# Patient Record
Sex: Female | Born: 1970 | Race: White | Hispanic: No | Marital: Married | Smoking: Never smoker
Health system: Southern US, Community
[De-identification: ages and names within clinical notes are randomized; demographics above are authoritative.]

## PROBLEM LIST (undated history)

## (undated) DIAGNOSIS — G8929 Other chronic pain: Secondary | ICD-10-CM

## (undated) DIAGNOSIS — R102 Pelvic and perineal pain: Secondary | ICD-10-CM

## (undated) DIAGNOSIS — M545 Low back pain, unspecified: Secondary | ICD-10-CM

## (undated) DIAGNOSIS — F32A Depression, unspecified: Secondary | ICD-10-CM

## (undated) DIAGNOSIS — Z8619 Personal history of other infectious and parasitic diseases: Secondary | ICD-10-CM

## (undated) DIAGNOSIS — K219 Gastro-esophageal reflux disease without esophagitis: Secondary | ICD-10-CM

## (undated) DIAGNOSIS — I10 Essential (primary) hypertension: Secondary | ICD-10-CM

## (undated) DIAGNOSIS — N946 Dysmenorrhea, unspecified: Secondary | ICD-10-CM

## (undated) DIAGNOSIS — I2699 Other pulmonary embolism without acute cor pulmonale: Secondary | ICD-10-CM

## (undated) DIAGNOSIS — N803 Endometriosis of pelvic peritoneum: Principal | ICD-10-CM

## (undated) DIAGNOSIS — F419 Anxiety disorder, unspecified: Secondary | ICD-10-CM

## (undated) DIAGNOSIS — N83209 Unspecified ovarian cyst, unspecified side: Secondary | ICD-10-CM

## (undated) DIAGNOSIS — J301 Allergic rhinitis due to pollen: Secondary | ICD-10-CM

## (undated) DIAGNOSIS — I429 Cardiomyopathy, unspecified: Secondary | ICD-10-CM

## (undated) DIAGNOSIS — T8859XA Other complications of anesthesia, initial encounter: Secondary | ICD-10-CM

## (undated) DIAGNOSIS — F329 Major depressive disorder, single episode, unspecified: Secondary | ICD-10-CM

## (undated) DIAGNOSIS — O2230 Deep phlebothrombosis in pregnancy, unspecified trimester: Secondary | ICD-10-CM

## (undated) DIAGNOSIS — T4145XA Adverse effect of unspecified anesthetic, initial encounter: Secondary | ICD-10-CM

## (undated) DIAGNOSIS — IMO0002 Reserved for concepts with insufficient information to code with codable children: Secondary | ICD-10-CM

## (undated) DIAGNOSIS — G43909 Migraine, unspecified, not intractable, without status migrainosus: Secondary | ICD-10-CM

## (undated) DIAGNOSIS — R519 Headache, unspecified: Secondary | ICD-10-CM

## (undated) DIAGNOSIS — R51 Headache: Secondary | ICD-10-CM

## (undated) HISTORY — DX: Dysmenorrhea, unspecified: N94.6

## (undated) HISTORY — DX: Personal history of other infectious and parasitic diseases: Z86.19

## (undated) HISTORY — DX: Unspecified ovarian cyst, unspecified side: N83.209

## (undated) HISTORY — DX: Other pulmonary embolism without acute cor pulmonale: I26.99

## (undated) HISTORY — DX: Reserved for concepts with insufficient information to code with codable children: IMO0002

## (undated) HISTORY — DX: Pelvic and perineal pain: R10.2

## (undated) HISTORY — DX: Cardiomyopathy, unspecified: I42.9

---
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
## 1997-08-10 ENCOUNTER — Emergency Department (HOSPITAL_COMMUNITY): Admission: EM | Admit: 1997-08-10 | Discharge: 1997-08-11 | Payer: Self-pay | Admitting: Emergency Medicine

## 1998-01-11 ENCOUNTER — Inpatient Hospital Stay (HOSPITAL_COMMUNITY): Admission: AD | Admit: 1998-01-11 | Discharge: 1998-01-11 | Payer: Self-pay | Admitting: Obstetrics & Gynecology

## 1998-01-11 ENCOUNTER — Encounter: Payer: Self-pay | Admitting: Obstetrics & Gynecology

## 1998-01-15 ENCOUNTER — Inpatient Hospital Stay (HOSPITAL_COMMUNITY): Admission: AD | Admit: 1998-01-15 | Discharge: 1998-01-15 | Payer: Self-pay | Admitting: Obstetrics & Gynecology

## 1998-01-24 ENCOUNTER — Encounter: Admission: RE | Admit: 1998-01-24 | Discharge: 1998-01-24 | Payer: Self-pay | Admitting: Family Medicine

## 1998-02-28 ENCOUNTER — Encounter: Admission: RE | Admit: 1998-02-28 | Discharge: 1998-02-28 | Payer: Self-pay | Admitting: Family Medicine

## 1998-03-07 ENCOUNTER — Encounter: Admission: RE | Admit: 1998-03-07 | Discharge: 1998-03-07 | Payer: Self-pay | Admitting: Family Medicine

## 1998-03-07 ENCOUNTER — Other Ambulatory Visit: Admission: RE | Admit: 1998-03-07 | Discharge: 1998-03-07 | Payer: Self-pay | Admitting: *Deleted

## 1998-03-09 ENCOUNTER — Emergency Department (HOSPITAL_COMMUNITY): Admission: EM | Admit: 1998-03-09 | Discharge: 1998-03-09 | Payer: Self-pay | Admitting: Emergency Medicine

## 1998-03-19 ENCOUNTER — Encounter: Admission: RE | Admit: 1998-03-19 | Discharge: 1998-03-19 | Payer: Self-pay | Admitting: Family Medicine

## 1998-04-05 ENCOUNTER — Ambulatory Visit (HOSPITAL_COMMUNITY): Admission: RE | Admit: 1998-04-05 | Discharge: 1998-04-05 | Payer: Self-pay | Admitting: *Deleted

## 1998-04-18 ENCOUNTER — Encounter: Admission: RE | Admit: 1998-04-18 | Discharge: 1998-04-18 | Payer: Self-pay | Admitting: Family Medicine

## 1998-05-01 ENCOUNTER — Encounter: Admission: RE | Admit: 1998-05-01 | Discharge: 1998-05-01 | Payer: Self-pay | Admitting: Family Medicine

## 1998-05-23 ENCOUNTER — Ambulatory Visit (HOSPITAL_COMMUNITY): Admission: RE | Admit: 1998-05-23 | Discharge: 1998-05-23 | Payer: Self-pay | Admitting: *Deleted

## 1998-05-29 ENCOUNTER — Inpatient Hospital Stay (HOSPITAL_COMMUNITY): Admission: AD | Admit: 1998-05-29 | Discharge: 1998-05-29 | Payer: Self-pay | Admitting: *Deleted

## 1998-06-11 ENCOUNTER — Encounter: Admission: RE | Admit: 1998-06-11 | Discharge: 1998-06-11 | Payer: Self-pay | Admitting: Family Medicine

## 1998-06-12 ENCOUNTER — Encounter (HOSPITAL_COMMUNITY): Admission: RE | Admit: 1998-06-12 | Discharge: 1998-08-13 | Payer: Self-pay | Admitting: *Deleted

## 1998-06-13 ENCOUNTER — Encounter: Admission: RE | Admit: 1998-06-13 | Discharge: 1998-06-13 | Payer: Self-pay | Admitting: Family Medicine

## 1998-06-22 ENCOUNTER — Encounter: Admission: RE | Admit: 1998-06-22 | Discharge: 1998-06-22 | Payer: Self-pay | Admitting: Family Medicine

## 1998-06-28 ENCOUNTER — Inpatient Hospital Stay (HOSPITAL_COMMUNITY): Admission: AD | Admit: 1998-06-28 | Discharge: 1998-06-28 | Payer: Self-pay | Admitting: Obstetrics & Gynecology

## 1998-06-28 ENCOUNTER — Encounter: Admission: RE | Admit: 1998-06-28 | Discharge: 1998-06-28 | Payer: Self-pay | Admitting: Family Medicine

## 1998-07-04 ENCOUNTER — Encounter: Admission: RE | Admit: 1998-07-04 | Discharge: 1998-07-04 | Payer: Self-pay | Admitting: Sports Medicine

## 1998-07-30 ENCOUNTER — Encounter: Admission: RE | Admit: 1998-07-30 | Discharge: 1998-07-30 | Payer: Self-pay | Admitting: Family Medicine

## 1998-08-07 DIAGNOSIS — O2230 Deep phlebothrombosis in pregnancy, unspecified trimester: Secondary | ICD-10-CM

## 1998-08-07 DIAGNOSIS — I2699 Other pulmonary embolism without acute cor pulmonale: Secondary | ICD-10-CM

## 1998-08-07 HISTORY — DX: Deep phlebothrombosis in pregnancy, unspecified trimester: O22.30

## 1998-08-07 HISTORY — DX: Other pulmonary embolism without acute cor pulmonale: I26.99

## 1998-08-12 ENCOUNTER — Inpatient Hospital Stay (HOSPITAL_COMMUNITY): Admission: AD | Admit: 1998-08-12 | Discharge: 1998-08-17 | Payer: Self-pay | Admitting: *Deleted

## 1998-08-16 ENCOUNTER — Encounter: Payer: Self-pay | Admitting: Obstetrics & Gynecology

## 1998-08-18 ENCOUNTER — Inpatient Hospital Stay (HOSPITAL_COMMUNITY): Admission: AD | Admit: 1998-08-18 | Discharge: 1998-08-25 | Payer: Self-pay | Admitting: *Deleted

## 1998-08-18 ENCOUNTER — Encounter: Payer: Self-pay | Admitting: Obstetrics

## 1998-08-19 ENCOUNTER — Encounter: Payer: Self-pay | Admitting: Obstetrics & Gynecology

## 1998-08-19 ENCOUNTER — Encounter: Payer: Self-pay | Admitting: Obstetrics

## 1998-08-20 ENCOUNTER — Encounter: Payer: Self-pay | Admitting: Pulmonary Disease

## 1998-08-22 ENCOUNTER — Encounter: Admission: RE | Admit: 1998-08-22 | Discharge: 1998-08-22 | Payer: Self-pay | Admitting: Family Medicine

## 1998-08-27 ENCOUNTER — Encounter: Admission: RE | Admit: 1998-08-27 | Discharge: 1998-08-27 | Payer: Self-pay | Admitting: Family Medicine

## 1998-08-30 ENCOUNTER — Encounter: Admission: RE | Admit: 1998-08-30 | Discharge: 1998-08-30 | Payer: Self-pay | Admitting: Family Medicine

## 1998-09-03 ENCOUNTER — Encounter: Admission: RE | Admit: 1998-09-03 | Discharge: 1998-09-03 | Payer: Self-pay | Admitting: Family Medicine

## 1998-09-04 ENCOUNTER — Encounter: Admission: RE | Admit: 1998-09-04 | Discharge: 1998-12-03 | Payer: Self-pay | Admitting: *Deleted

## 1998-09-11 ENCOUNTER — Encounter: Admission: RE | Admit: 1998-09-11 | Discharge: 1998-09-11 | Payer: Self-pay | Admitting: Sports Medicine

## 1998-09-12 ENCOUNTER — Encounter: Admission: RE | Admit: 1998-09-12 | Discharge: 1998-09-12 | Payer: Self-pay | Admitting: Family Medicine

## 1998-09-25 ENCOUNTER — Encounter: Admission: RE | Admit: 1998-09-25 | Discharge: 1998-09-25 | Payer: Self-pay | Admitting: Family Medicine

## 1998-10-04 ENCOUNTER — Encounter: Admission: RE | Admit: 1998-10-04 | Discharge: 1998-10-04 | Payer: Self-pay | Admitting: Family Medicine

## 1998-10-08 ENCOUNTER — Encounter: Admission: RE | Admit: 1998-10-08 | Discharge: 1998-10-08 | Payer: Self-pay | Admitting: Family Medicine

## 1998-10-12 ENCOUNTER — Encounter: Admission: RE | Admit: 1998-10-12 | Discharge: 1998-10-12 | Payer: Self-pay | Admitting: Family Medicine

## 1998-10-18 ENCOUNTER — Encounter: Payer: Self-pay | Admitting: Family Medicine

## 1998-10-18 ENCOUNTER — Encounter: Payer: Self-pay | Admitting: Emergency Medicine

## 1998-10-18 ENCOUNTER — Inpatient Hospital Stay (HOSPITAL_COMMUNITY): Admission: EM | Admit: 1998-10-18 | Discharge: 1998-10-20 | Payer: Self-pay | Admitting: Emergency Medicine

## 1998-10-18 ENCOUNTER — Inpatient Hospital Stay (HOSPITAL_COMMUNITY): Admission: AD | Admit: 1998-10-18 | Discharge: 1998-10-18 | Payer: Self-pay | Admitting: Internal Medicine

## 1998-10-18 ENCOUNTER — Encounter: Admission: RE | Admit: 1998-10-18 | Discharge: 1998-10-18 | Payer: Self-pay | Admitting: Family Medicine

## 1998-10-19 ENCOUNTER — Encounter: Payer: Self-pay | Admitting: Family Medicine

## 1998-10-23 ENCOUNTER — Encounter: Admission: RE | Admit: 1998-10-23 | Discharge: 1998-10-23 | Payer: Self-pay | Admitting: Obstetrics & Gynecology

## 1998-10-26 ENCOUNTER — Encounter: Admission: RE | Admit: 1998-10-26 | Discharge: 1998-10-26 | Payer: Self-pay | Admitting: Family Medicine

## 1998-11-02 ENCOUNTER — Encounter: Admission: RE | Admit: 1998-11-02 | Discharge: 1998-11-02 | Payer: Self-pay | Admitting: Sports Medicine

## 1998-11-06 ENCOUNTER — Encounter: Admission: RE | Admit: 1998-11-06 | Discharge: 1998-11-06 | Payer: Self-pay | Admitting: Sports Medicine

## 1998-11-23 ENCOUNTER — Encounter: Admission: RE | Admit: 1998-11-23 | Discharge: 1998-11-23 | Payer: Self-pay | Admitting: Family Medicine

## 1998-11-27 ENCOUNTER — Encounter: Admission: RE | Admit: 1998-11-27 | Discharge: 1998-11-27 | Payer: Self-pay | Admitting: Sports Medicine

## 1998-12-28 ENCOUNTER — Encounter: Admission: RE | Admit: 1998-12-28 | Discharge: 1998-12-28 | Payer: Self-pay | Admitting: Sports Medicine

## 1999-01-15 ENCOUNTER — Encounter: Admission: RE | Admit: 1999-01-15 | Discharge: 1999-01-15 | Payer: Self-pay | Admitting: Sports Medicine

## 1999-01-22 ENCOUNTER — Encounter: Admission: RE | Admit: 1999-01-22 | Discharge: 1999-01-22 | Payer: Self-pay | Admitting: Sports Medicine

## 1999-01-29 ENCOUNTER — Encounter: Admission: RE | Admit: 1999-01-29 | Discharge: 1999-01-29 | Payer: Self-pay | Admitting: Sports Medicine

## 1999-02-05 ENCOUNTER — Encounter: Admission: RE | Admit: 1999-02-05 | Discharge: 1999-02-05 | Payer: Self-pay | Admitting: Family Medicine

## 1999-02-14 ENCOUNTER — Encounter: Admission: RE | Admit: 1999-02-14 | Discharge: 1999-02-14 | Payer: Self-pay | Admitting: Family Medicine

## 1999-04-09 ENCOUNTER — Encounter: Admission: RE | Admit: 1999-04-09 | Discharge: 1999-04-09 | Payer: Self-pay | Admitting: Sports Medicine

## 1999-11-05 ENCOUNTER — Emergency Department (HOSPITAL_COMMUNITY): Admission: EM | Admit: 1999-11-05 | Discharge: 1999-11-05 | Payer: Self-pay | Admitting: Emergency Medicine

## 1999-11-05 ENCOUNTER — Encounter: Payer: Self-pay | Admitting: Emergency Medicine

## 2000-03-18 ENCOUNTER — Emergency Department (HOSPITAL_COMMUNITY): Admission: EM | Admit: 2000-03-18 | Discharge: 2000-03-18 | Payer: Self-pay | Admitting: Emergency Medicine

## 2000-03-18 ENCOUNTER — Encounter: Payer: Self-pay | Admitting: Emergency Medicine

## 2000-06-11 ENCOUNTER — Encounter: Payer: Self-pay | Admitting: Emergency Medicine

## 2000-06-11 ENCOUNTER — Emergency Department (HOSPITAL_COMMUNITY): Admission: EM | Admit: 2000-06-11 | Discharge: 2000-06-11 | Payer: Self-pay | Admitting: Emergency Medicine

## 2001-04-28 ENCOUNTER — Encounter: Admission: RE | Admit: 2001-04-28 | Discharge: 2001-04-28 | Payer: Self-pay | Admitting: Internal Medicine

## 2001-04-28 ENCOUNTER — Encounter: Payer: Self-pay | Admitting: Internal Medicine

## 2003-04-27 ENCOUNTER — Ambulatory Visit (HOSPITAL_COMMUNITY): Admission: RE | Admit: 2003-04-27 | Discharge: 2003-04-27 | Payer: Self-pay | Admitting: Family Medicine

## 2003-09-25 ENCOUNTER — Emergency Department (HOSPITAL_COMMUNITY): Admission: EM | Admit: 2003-09-25 | Discharge: 2003-09-25 | Payer: Self-pay | Admitting: Emergency Medicine

## 2004-01-13 ENCOUNTER — Emergency Department (HOSPITAL_COMMUNITY): Admission: EM | Admit: 2004-01-13 | Discharge: 2004-01-14 | Payer: Self-pay | Admitting: Emergency Medicine

## 2004-08-20 ENCOUNTER — Emergency Department (HOSPITAL_COMMUNITY): Admission: EM | Admit: 2004-08-20 | Discharge: 2004-08-20 | Payer: Self-pay | Admitting: Emergency Medicine

## 2004-12-09 ENCOUNTER — Emergency Department (HOSPITAL_COMMUNITY): Admission: EM | Admit: 2004-12-09 | Discharge: 2004-12-09 | Payer: Self-pay | Admitting: Emergency Medicine

## 2005-01-27 ENCOUNTER — Encounter: Admission: RE | Admit: 2005-01-27 | Discharge: 2005-01-27 | Payer: Self-pay | Admitting: Internal Medicine

## 2005-11-19 ENCOUNTER — Emergency Department (HOSPITAL_COMMUNITY): Admission: EM | Admit: 2005-11-19 | Discharge: 2005-11-19 | Payer: Self-pay | Admitting: Emergency Medicine

## 2005-11-25 ENCOUNTER — Ambulatory Visit: Payer: Self-pay | Admitting: Cardiology

## 2005-12-02 ENCOUNTER — Ambulatory Visit: Payer: Self-pay

## 2005-12-02 ENCOUNTER — Encounter: Payer: Self-pay | Admitting: Cardiology

## 2008-01-07 HISTORY — PX: ECTOPIC PREGNANCY SURGERY: SHX613

## 2008-01-07 HISTORY — PX: TUBAL LIGATION: SHX77

## 2008-03-02 ENCOUNTER — Encounter: Admission: RE | Admit: 2008-03-02 | Discharge: 2008-03-02 | Payer: Self-pay | Admitting: Gastroenterology

## 2008-03-09 ENCOUNTER — Ambulatory Visit (HOSPITAL_COMMUNITY): Admission: RE | Admit: 2008-03-09 | Discharge: 2008-03-09 | Payer: Self-pay | Admitting: Gastroenterology

## 2008-05-11 ENCOUNTER — Ambulatory Visit (HOSPITAL_COMMUNITY): Admission: RE | Admit: 2008-05-11 | Discharge: 2008-05-11 | Payer: Self-pay | Admitting: Gastroenterology

## 2008-06-05 ENCOUNTER — Emergency Department (HOSPITAL_COMMUNITY): Admission: EM | Admit: 2008-06-05 | Discharge: 2008-06-06 | Payer: Self-pay | Admitting: Emergency Medicine

## 2008-08-14 ENCOUNTER — Encounter: Payer: Self-pay | Admitting: Obstetrics and Gynecology

## 2008-08-14 ENCOUNTER — Ambulatory Visit (HOSPITAL_COMMUNITY): Admission: AD | Admit: 2008-08-14 | Discharge: 2008-08-14 | Payer: Self-pay | Admitting: Obstetrics and Gynecology

## 2008-08-15 ENCOUNTER — Ambulatory Visit (HOSPITAL_COMMUNITY): Admission: AD | Admit: 2008-08-15 | Discharge: 2008-08-15 | Payer: Self-pay | Admitting: Obstetrics and Gynecology

## 2010-01-27 ENCOUNTER — Encounter: Payer: Self-pay | Admitting: Gastroenterology

## 2010-04-14 LAB — COMPREHENSIVE METABOLIC PANEL
ALT: 17 U/L (ref 0–35)
AST: 19 U/L (ref 0–37)
Albumin: 4.1 g/dL (ref 3.5–5.2)
Alkaline Phosphatase: 37 U/L — ABNORMAL LOW (ref 39–117)
BUN: 8 mg/dL (ref 6–23)
CO2: 24 mEq/L (ref 19–32)
Calcium: 9.1 mg/dL (ref 8.4–10.5)
Chloride: 107 mEq/L (ref 96–112)
Creatinine, Ser: 0.62 mg/dL (ref 0.4–1.2)
GFR calc Af Amer: >60 mL/min (ref >60)
GFR calc non Af Amer: >60 mL/min (ref >60)
Glucose, Bld: 126 mg/dL — ABNORMAL HIGH (ref 70–99)
Potassium: 3.5 mEq/L (ref 3.5–5.1)
Sodium: 137 mEq/L (ref 135–145)
Total Bilirubin: 0.8 mg/dL (ref 0.3–1.2)
Total Protein: 6.2 g/dL (ref 6.0–8.3)

## 2010-04-14 LAB — RH IMMUNE GLOBULIN WORKUP (NOT WOMEN'S HOSP)
ABO/RH(D): B NEG
Antibody Screen: NEGATIVE

## 2010-04-14 LAB — CBC
HCT: 33.3 % — ABNORMAL LOW (ref 36.0–46.0)
Hemoglobin: 11.8 g/dL — ABNORMAL LOW (ref 12.0–15.0)
MCHC: 35.4 g/dL (ref 30.0–36.0)
MCV: 93.8 fL (ref 78.0–100.0)
Platelets: 191 10*3/uL (ref 150–400)
RBC: 3.55 MIL/uL — ABNORMAL LOW (ref 3.87–5.11)
RDW: 12.8 % (ref 11.5–15.5)
WBC: 7.2 10*3/uL (ref 4.0–10.5)

## 2010-04-14 LAB — HCG, QUANTITATIVE, PREGNANCY: hCG, Beta Chain, Quant, S: 38 m[IU]/mL — ABNORMAL HIGH (ref <5)

## 2010-04-14 LAB — ABO/RH: ABO/RH(D): B NEG

## 2010-04-16 LAB — CBC
HCT: 41.6 % (ref 36.0–46.0)
Hemoglobin: 14.7 g/dL (ref 12.0–15.0)
MCHC: 35.4 g/dL (ref 30.0–36.0)
MCV: 91 fL (ref 78.0–100.0)
Platelets: 180 10*3/uL (ref 150–400)
RBC: 4.58 MIL/uL (ref 3.87–5.11)
RDW: 12.4 % (ref 11.5–15.5)
WBC: 6.8 10*3/uL (ref 4.0–10.5)

## 2010-04-16 LAB — POCT PREGNANCY, URINE: Preg Test, Ur: NEGATIVE

## 2010-04-16 LAB — URINALYSIS, ROUTINE W REFLEX MICROSCOPIC
Bilirubin Urine: NEGATIVE
Glucose, UA: NEGATIVE mg/dL
Hgb urine dipstick: NEGATIVE
Ketones, ur: NEGATIVE mg/dL
Nitrite: NEGATIVE
Protein, ur: NEGATIVE mg/dL
Specific Gravity, Urine: 1.007 (ref 1.005–1.030)
Urobilinogen, UA: 0.2 mg/dL (ref 0.0–1.0)
pH: 6 (ref 5.0–8.0)

## 2010-04-16 LAB — POCT I-STAT, CHEM 8
BUN: 14 mg/dL (ref 6–23)
Calcium, Ion: 1.15 mmol/L (ref 1.12–1.32)
Chloride: 106 mEq/L (ref 96–112)
Creatinine, Ser: 0.7 mg/dL (ref 0.4–1.2)
Glucose, Bld: 77 mg/dL (ref 70–99)
HCT: 44 % (ref 36.0–46.0)
Hemoglobin: 15 g/dL (ref 12.0–15.0)
Potassium: 3.8 mEq/L (ref 3.5–5.1)
Sodium: 138 mEq/L (ref 135–145)
TCO2: 22 mmol/L (ref 0–100)

## 2010-04-16 LAB — DIFFERENTIAL
Basophils Absolute: 0 10*3/uL (ref 0.0–0.1)
Basophils Relative: 0 % (ref 0–1)
Eosinophils Absolute: 0.1 10*3/uL (ref 0.0–0.7)
Eosinophils Relative: 2 % (ref 0–5)
Lymphocytes Relative: 30 % (ref 12–46)
Lymphs Abs: 2.1 10*3/uL (ref 0.7–4.0)
Monocytes Absolute: 0.5 10*3/uL (ref 0.1–1.0)
Monocytes Relative: 7 % (ref 3–12)
Neutro Abs: 4.2 10*3/uL (ref 1.7–7.7)
Neutrophils Relative %: 61 % (ref 43–77)

## 2010-04-16 LAB — HEMOCCULT GUIAC POC 1CARD (OFFICE): Fecal Occult Bld: POSITIVE

## 2010-04-16 LAB — GC/CHLAMYDIA PROBE AMP, GENITAL
Chlamydia, DNA Probe: NEGATIVE
GC Probe Amp, Genital: NEGATIVE

## 2010-04-16 LAB — WET PREP, GENITAL
Clue Cells Wet Prep HPF POC: NONE SEEN
Trich, Wet Prep: NONE SEEN
Yeast Wet Prep HPF POC: NONE SEEN

## 2010-05-21 NOTE — Op Note (Signed)
[name], Hughes              ACCOUNT NO.:  [id number]   MEDICAL RECORD NO.:  [id number]          PATIENT TYPE:  AMB   LOCATION:  DFTL                          FACILITY:  [facility]   PHYSICIAN:  Cynthia P. Romine, M.D.DATE OF BIRTH:  09-23-1970   DATE OF PROCEDURE:  08/14/2008  DATE OF DISCHARGE:  08/14/2008                               OPERATIVE REPORT   PREOPERATIVE DIAGNOSIS:  Left ectopic pregnancy and desire for attempt  at permanent surgical sterilization.   POSTOPERATIVE DIAGNOSIS:  Left ectopic pregnancy and desire for attempt  at permanent surgical sterilization.   PATHOLOGY:  Pending.   PROCEDURE:  Laparoscopic left salpingectomy and right tubal cautery  procedure.   SURGEON:  Cynthia P. Romine, MD   ANESTHESIA:  General endotracheal.   ESTIMATED BLOOD LOSS:  Minimal for surgery.  There was approximately 200  mL in the abdomen upon entry.   COMPLICATIONS:  None.   PROCEDURE:  The patient was taken to the operating room and after the  induction of adequate general anesthesia, was placed in dorsal lithotomy  position and prepped and draped in usual fashion.  A Foley catheter was  inserted.  The Hulka uterine manipulator was placed without difficulty.  Attention was next turned to the umbilicus.  A small subumbilical  incision was made to the site of previous laparoscopy after infiltrating  with 2 mL of 0.25% Marcaine.  The Veress needle was inserted into  peritoneal space.  Proper placement was tested by noting a negative  aspirate and free flow of saline through the Veress needle, again with a  negative aspirate and then, by noting the response, a drop of saline  placed, the hub of the needle to negative pressure as the abdominal wall  was elevated.  Pneumoperitoneum was created with 2 L of CO2 using the  automatic insufflator.  A disposable 10/11-mm bladed trocar was then  inserted into peritoneal space and a proper placement noted with the  laparoscope.  The  abdomen was inspected.  There was approximately 200 mL  of blood and clot in the cul-de-sac and around the left tube.  The  fimbria of the left tube were covered with clot and the distal portion  of the isthmus was dilated, consistent with an ectopic pregnancy.  In  the cul-de-sac, there was some endometriosis on the patient's right  involving the uterosacral ligament, which caused windows in the  peritoneum.  The right tube and ovary did appear normal.  The anterior  cul-de-sac was normal.  At the level of the pelvic brim, near the  infundibulopelvic ligament, there was some endometriosis that was  adhering the colon to the sidewall.  After infiltrating the areas with  0.25% Marcaine and transilluminating two smaller 5-mm incisions were  made in the right and left lower quadrants and the 5-mm trocars were  placed under direct visualization.  An EnSeal was placed through one and  a grasper through the other.  The EnSeal was used to come across the  tube at the cornu and then across the mesosalpinx until the tube was  free.  The tube  was grasped with a long pickup and brought out through  the operative laparoscope to the umbilicus.  The Nezhat was used to  irrigate the site of the salpingectomy.  It was found to be free of  bleeding.  Bipolar cautery was then used to cauterize an approximately 3-  cm segment of the right tube.  Photographic that documentation was taken  after the cautery.  Photograph was taken of the right cul-de-sac with  the endometriosis.  The upper abdomen was inspected.  The liver edge was  smooth.  The adhesions were throughout the periumbilical area and on the  patient's left, these were not taken down.  They were basically omentum  to the anterior abdominal wall.  The lower trocar sleeves were removed  under direct visualization.  The scope was removed and pneumoperitoneum  was allowed to escape.  The umbilical sleeve was removed.  The fascia  was closed with a  single suture of 0 Vicryl and the umbilicus.  The skin  was closed with 4-0 Vicryl Rapide at the umbilicus and with Dermabond at  the 5-mm incision.  Instruments removed from the vagina and the  procedure was terminated.  The patient tolerated it well, went in  satisfactory condition to postanesthesia recovery.      Cynthia P. Romine, M.D.  Electronically Signed     CPR/MEDQ  D:  08/14/2008  T:  08/15/2008  Job:  [id number]

## 2010-05-21 NOTE — Assessment & Plan Note (Signed)
[facility]                                 ON-CALL NOTE   Nicole Hughes, Nicole Hughes                       MRN:          [id number]  DATE:06/05/2008                            DOB:          1970/08/23    PHONE NUMBER:  [phone].   PHYSICIAN:  Anselmo Rod, M.D.   Patient calls today complaining of back pain and lower abdominal pain  that began acutely on Saturday night.  She has had chronic problems of  nausea, anorexia, and unexplained weight loss which has been evaluated  by Dr. Loreta Ave without a clear cause, per the patient's report.  I advised  her that she needs to be seen in an urgent care center or an emergency  room as soon as possible for further evaluation.  She agrees.  She  states that she will have a friend or family member take her to an  urgent care center or the emergency room immediately.     Venita Lick. Russella Dar, MD, [facility]  Electronically Signed    MTS/MedQ  DD: 06/05/2008  DT: 06/05/2008  Job #: [id number]   cc:   Anselmo Rod, M.D.

## 2011-04-18 ENCOUNTER — Ambulatory Visit (INDEPENDENT_AMBULATORY_CARE_PROVIDER_SITE_OTHER): Payer: 59 | Admitting: Family Medicine

## 2011-04-18 VITALS — BP 182/96 | HR 69 | Temp 98.6°F | Resp 20 | Ht 65.0 in | Wt 156.0 lb

## 2011-04-18 DIAGNOSIS — M545 Low back pain, unspecified: Secondary | ICD-10-CM

## 2011-04-18 DIAGNOSIS — M549 Dorsalgia, unspecified: Secondary | ICD-10-CM

## 2011-04-18 MED ORDER — OXYCODONE-ACETAMINOPHEN 5-325 MG PO TABS: 1.0000 | ORAL_TABLET | Freq: Three times a day (TID) | ORAL | Status: DC | PRN

## 2011-04-18 MED ORDER — PREDNISONE 20 MG PO TABS: ORAL_TABLET | ORAL | Status: DC

## 2011-04-18 MED ORDER — CYCLOBENZAPRINE HCL 10 MG PO TABS: ORAL_TABLET | ORAL | Status: DC

## 2011-04-18 NOTE — Progress Notes (Signed)
41 yo woman who volunteered for [employer] last Thursday 8 days ago and developed left paralumbar pain the next day.  She has had spasm in paraspinal muscles since. No position is comfortable.  No leg weakness, some tingling in big toe.  Tried NSAID's OTC without help. Has children 12 and 17 at home, driven by husband O: in obvious pain Tight left parasp. Muscles with tenderness from lower left ribs downward Motor of LLE is normal as is sensory Good pedal pulses, no edema Normal SLR  A:  Muscle strain lumbar muscles  P: percocet, prednisone, flexeril at hs. Call INB 48 hours.

## 2011-04-18 NOTE — Patient Instructions (Signed)

## 2011-04-26 ENCOUNTER — Other Ambulatory Visit: Payer: Self-pay

## 2011-04-26 ENCOUNTER — Ambulatory Visit: Payer: 59

## 2011-04-26 ENCOUNTER — Ambulatory Visit (INDEPENDENT_AMBULATORY_CARE_PROVIDER_SITE_OTHER): Payer: 59 | Admitting: Family Medicine

## 2011-04-26 VITALS — BP 169/102 | HR 85 | Temp 98.6°F | Resp 16 | Ht 64.5 in | Wt 154.8 lb

## 2011-04-26 DIAGNOSIS — B029 Zoster without complications: Secondary | ICD-10-CM

## 2011-04-26 DIAGNOSIS — R209 Unspecified disturbances of skin sensation: Secondary | ICD-10-CM

## 2011-04-26 DIAGNOSIS — R200 Anesthesia of skin: Secondary | ICD-10-CM

## 2011-04-26 DIAGNOSIS — M549 Dorsalgia, unspecified: Secondary | ICD-10-CM

## 2011-04-26 DIAGNOSIS — R202 Paresthesia of skin: Secondary | ICD-10-CM

## 2011-04-26 MED ORDER — VALACYCLOVIR HCL 1 G PO TABS: 1000.0000 mg | ORAL_TABLET | Freq: Three times a day (TID) | ORAL | Status: DC

## 2011-04-26 MED ORDER — CYCLOBENZAPRINE HCL 10 MG PO TABS: ORAL_TABLET | ORAL | Status: DC

## 2011-04-26 MED ORDER — PREDNISONE 20 MG PO TABS: ORAL_TABLET | ORAL | Status: AC

## 2011-04-26 MED ORDER — ACYCLOVIR 400 MG PO TABS: 400.0000 mg | ORAL_TABLET | Freq: Two times a day (BID) | ORAL | Status: AC

## 2011-04-26 MED ORDER — OXYCODONE-ACETAMINOPHEN 5-325 MG PO TABS
1.0000 | ORAL_TABLET | Freq: Three times a day (TID) | ORAL | Status: AC | PRN
Start: 2011-04-26 — End: 2011-05-06

## 2011-04-26 MED ORDER — KETOROLAC TROMETHAMINE 60 MG/2ML IM SOLN
60.0000 mg | Freq: Once | INTRAMUSCULAR | Status: AC
  Administered 2011-04-26: 60 mg via INTRAMUSCULAR

## 2011-04-26 NOTE — Telephone Encounter (Signed)
Changed Valtrex to generic. Pt came in to have it changed. Sent to [facility] on [address]

## 2011-04-26 NOTE — Progress Notes (Signed)
Patient Name: KAEDE CLENDENEN Date of Birth: 05-14-1970 Medical Record Number: [id number] Gender: female Date of Encounter: 04/26/2011  History of Present Illness:  ALIVIANA BURDELL is a 41 y.o. very pleasant female patient who presents with the following:  Here today to recheck her back pain- see OV 04/18/11.  Prednisone seemed to help her a lot last time.  She finsished her prednisone on Tuesday- by Wednesday the pain was coming back. Today is Sunday.  Pain got much worse again last night.  Pain is worst in her left lower back and it radiates down her left leg.  Her left foot is tingling again.  She has no saddle anesthesia or bowel/ bladder incontinence.   She has never had back pain like this before (prior to her last visit here).  The pain started after she finished working on a [employer] house a little over a week ago- insidious onset.    She has also noticed a tender ?bite on her left trunk for about a week.  It is painful and the surrounding skin is tender/ sensitive/ burning/ tingling  LMP was the first of the month.   She is otherwise generally healthy- she is very excited because her daughter is going to the prom tonight and she is looking forward to taking photos and seeing her off.   There is no problem list on file for this patient.  No past medical history on file. No past surgical history on file. History  Substance Use Topics  . Smoking status: Never Smoker   . Smokeless tobacco: Not on file  . Alcohol Use: Not on file   No family history on file. No Known Allergies  Medication list has been reviewed and updated.  Review of Systems: As per HPI- otherwise negative.  Physical Examination: Filed Vitals:   04/26/11 1108  BP: 169/102  Pulse: 85  Temp: 98.6 F (37 C)  TempSrc: Oral  Resp: 16  Height: 5' 4.5" (1.638 m)  Weight: 154 lb 12.8 oz (70.217 kg)   130/88 recheck BP after toradol Body mass index is 26.16 kg/(m^2).  GEN: WDWN, NAD,  Non-toxic, A & O x 3, appears uncomfortable HEENT: Atraumatic, Normocephalic. Neck supple. No masses, No LAD. Ears and Nose: No external deformity. CV: RRR, No M/G/R. No JVD. No thrill. No extra heart sounds. PULM: CTA B, no wheezes, crackles, rhonchi. No retractions. No resp. distress. No accessory muscle use. There is a nickel sized tender lesion on her left trunk and a smaller lesion medially in the same dermatome, the surrounding skin is tender and sensitive.  No active vesicles EXTR: No c/c/e NEURO Normal gait.  PSYCH: Normally interactive. Conversant. Not depressed or anxious appearing.  Calm demeanor.  Back: tender and very tight in left thoracic and lumbar paraspinous muscles.  Limited flexion and extension.  LE strength testing limited by pain, but normal sensaion and DTR both knees.  No saddle anesthesia.  Positive straight leg raise left leg  [facility] reading (PRIMARY) by  Dr. Patsy Lager. Negative lumbar and thoracic spine films  Assessment and Plan: 1. Back pain  ketorolac (TORADOL) injection 60 mg, predniSONE (DELTASONE) 20 MG tablet  2. Numbness and tingling of left leg  DG Lumbar Spine Complete, DG Thoracic Spine 2 View, predniSONE (DELTASONE) 20 MG tablet  3. Shingles  valACYclovir (VALTREX) 1000 MG tablet  4. Acute back pain  oxyCODONE-acetaminophen (ROXICET) 5-325 MG per tablet, cyclobenzaprine (FLEXERIL) 10 MG tablet   Gave toradol IM now which did  seem to help.  Refilled percocet and flexeril, and gave rx for shingles.  Discussed possible transmission of chicken pox virus to others who are susceptible.  Will do another prednisone taper- 40 for 4 days, 20 for 4 days, 10 for 2 days.  If her symptoms return again we may need to do an MRI as I do suspect a bulging disc.  If she is not better in the next couple of days, if her symptoms return, or if she develops any signs or symptoms of CES (went over these) please let us know!

## 2011-06-17 ENCOUNTER — Emergency Department (HOSPITAL_COMMUNITY): Payer: 59

## 2011-06-17 ENCOUNTER — Emergency Department (HOSPITAL_COMMUNITY)
Admission: EM | Admit: 2011-06-17 | Discharge: 2011-06-17 | Disposition: A | Discharge status: Home Health | Payer: 59 | Attending: Emergency Medicine | Admitting: Emergency Medicine

## 2011-06-17 ENCOUNTER — Encounter (HOSPITAL_COMMUNITY): Payer: Self-pay | Admitting: *Deleted

## 2011-06-17 DIAGNOSIS — S61409A Unspecified open wound of unspecified hand, initial encounter: Secondary | ICD-10-CM | POA: Insufficient documentation

## 2011-06-17 DIAGNOSIS — R42 Dizziness and giddiness: Secondary | ICD-10-CM | POA: Insufficient documentation

## 2011-06-17 DIAGNOSIS — R110 Nausea: Secondary | ICD-10-CM

## 2011-06-17 DIAGNOSIS — R112 Nausea with vomiting, unspecified: Secondary | ICD-10-CM | POA: Insufficient documentation

## 2011-06-17 DIAGNOSIS — Y998 Other external cause status: Secondary | ICD-10-CM | POA: Insufficient documentation

## 2011-06-17 DIAGNOSIS — Y92009 Unspecified place in unspecified non-institutional (private) residence as the place of occurrence of the external cause: Secondary | ICD-10-CM | POA: Insufficient documentation

## 2011-06-17 DIAGNOSIS — T148XXA Other injury of unspecified body region, initial encounter: Secondary | ICD-10-CM

## 2011-06-17 DIAGNOSIS — Z86711 Personal history of pulmonary embolism: Secondary | ICD-10-CM | POA: Insufficient documentation

## 2011-06-17 DIAGNOSIS — W460XXA Contact with hypodermic needle, initial encounter: Secondary | ICD-10-CM | POA: Insufficient documentation

## 2011-06-17 LAB — CBC
HCT: 38 % (ref 36.0–46.0)
Hemoglobin: 13.3 g/dL (ref 12.0–15.0)
MCH: 31.4 pg (ref 26.0–34.0)
MCHC: 35 g/dL (ref 30.0–36.0)
MCV: 89.6 fL (ref 78.0–100.0)
Platelets: 207 10*3/uL (ref 150–400)
RBC: 4.24 MIL/uL (ref 3.87–5.11)
RDW: 12.2 % (ref 11.5–15.5)
WBC: 6.9 10*3/uL (ref 4.0–10.5)

## 2011-06-17 LAB — BASIC METABOLIC PANEL
BUN: 9 mg/dL (ref 6–23)
CO2: 24 mEq/L (ref 19–32)
Calcium: 9.3 mg/dL (ref 8.4–10.5)
Chloride: 103 mEq/L (ref 96–112)
Creatinine, Ser: 0.58 mg/dL (ref 0.50–1.10)
GFR calc Af Amer: >90 mL/min (ref >90)
GFR calc non Af Amer: >90 mL/min (ref >90)
Glucose, Bld: 85 mg/dL (ref 70–99)
Potassium: 3.9 mEq/L (ref 3.5–5.1)
Sodium: 139 mEq/L (ref 135–145)

## 2011-06-17 LAB — POCT I-STAT TROPONIN I: Troponin i, poc: 0 ng/mL (ref 0.00–0.08)

## 2011-06-17 LAB — D-DIMER, QUANTITATIVE: D-Dimer, Quant: 0.77 ug/mL-FEU — ABNORMAL HIGH (ref 0.00–0.48)

## 2011-06-17 MED ORDER — HYDROCODONE-ACETAMINOPHEN 5-325 MG PO TABS
2.0000 | ORAL_TABLET | Freq: Once | ORAL | Status: AC
  Administered 2011-06-17: 2 via ORAL
  Filled 2011-06-17: qty 2

## 2011-06-17 MED ORDER — ONDANSETRON 4 MG PO TBDP
8.0000 mg | ORAL_TABLET | Freq: Once | ORAL | Status: AC
  Administered 2011-06-17: 8 mg via ORAL
  Filled 2011-06-17: qty 2

## 2011-06-17 MED ORDER — IOHEXOL 350 MG/ML SOLN
100.0000 mL | Freq: Once | INTRAVENOUS | Status: AC | PRN
  Administered 2011-06-17: 100 mL via INTRAVENOUS

## 2011-06-17 NOTE — ED Notes (Signed)
Draw and hold rainbow not completed.

## 2011-06-17 NOTE — Discharge Instructions (Signed)
Your ct scan was read as being unremarkable. Rest, drink plenty of fluids.  Take tylenol/advil as need. Follow up with primary care doctor in coming week.  Your blood pressure is high today - follow up with primary care doctor for recheck in coming week.  Return to ER if worse, trouble breathing, fevers, arm swelling, numbness/weakness, other concern.    You were given pain medication in the ER - no driving for the next 6 hours.      Hypertension As your heart beats, it forces blood through your arteries. This force is your blood pressure. If the pressure is too high, it is called hypertension (HTN) or high blood pressure. HTN is dangerous because you may have it and not know it. High blood pressure may mean that your heart has to work harder to pump blood. Your arteries may be narrow or stiff. The extra work puts you at risk for heart disease, stroke, and other problems.  Blood pressure consists of two numbers, a higher number over a lower, 110/72, for example. It is stated as "110 over 72." The ideal is below 120 for the top number (systolic) and under 80 for the bottom (diastolic). Write down your blood pressure today. You should pay close attention to your blood pressure if you have certain conditions such as:  Heart failure.   Prior heart attack.   Diabetes   Chronic kidney disease.   Prior stroke.   Multiple risk factors for heart disease.  To see if you have HTN, your blood pressure should be measured while you are seated with your arm held at the level of the heart. It should be measured at least twice. A one-time elevated blood pressure reading (especially in the Emergency Department) does not mean that you need treatment. There may be conditions in which the blood pressure is different between your right and left arms. It is important to see your caregiver soon for a recheck. Most people have essential hypertension which means that there is not a specific cause. This type of  high blood pressure may be lowered by changing lifestyle factors such as:  Stress.   Smoking.   Lack of exercise.   Excessive weight.   Drug/tobacco/alcohol use.   Eating less salt.  Most people do not have symptoms from high blood pressure until it has caused damage to the body. Effective treatment can often prevent, delay or reduce that damage. TREATMENT  When a cause has been identified, treatment for high blood pressure is directed at the cause. There are a large number of medications to treat HTN. These fall into several categories, and your caregiver will help you select the medicines that are best for you. Medications may have side effects. You should review side effects with your caregiver. If your blood pressure stays high after you have made lifestyle changes or started on medicines,   Your medication(s) may need to be changed.   Other problems may need to be addressed.   Be certain you understand your prescriptions, and know how and when to take your medicine.   Be sure to follow up with your caregiver within the time frame advised (usually within two weeks) to have your blood pressure rechecked and to review your medications.   If you are taking more than one medicine to lower your blood pressure, make sure you know how and at what times they should be taken. Taking two medicines at the same time can result in blood pressure that is too  low.  SEEK IMMEDIATE MEDICAL CARE IF:  You develop a severe headache, blurred or changing vision, or confusion.   You have unusual weakness or numbness, or a faint feeling.   You have severe chest or abdominal pain, vomiting, or breathing problems.  MAKE SURE YOU:   Understand these instructions.   Will watch your condition.   Will get help right away if you are not doing well or get worse.  Document Released: 12/23/2004 Document Revised: 12/12/2010 Document Reviewed: 08/13/2007 [facility] Patient Information 2012 [place],  [place].

## 2011-06-17 NOTE — ED Provider Notes (Signed)
History    This chart was scribed for Suzi Roots, MD, MD by Smitty Pluck. The patient was seen in room STRE5 and the patient's care was started at 4:28PM.   CSN: [id number]  Arrival date & time 06/17/11  1226   First MD Initiated Contact with Patient 06/17/11 1610      Chief Complaint  Patient presents with  . Body Fluid Exposure    (Consider location/radiation/quality/duration/timing/severity/associated sxs/prior treatment) The history is provided by the patient.   Nicole Hughes is a 41 y.o. female who presents to the Emergency Department due to exposure to parvo shot (stuck herself in hand) while trying to administer the drug to her dogs. Pt reports that she had tingling in hand radiating to arm and neck. She reports that she feel "weird" and that her head is "swimmy."  Denies any other pain or illness. She reports nl food intake. Symptoms have been constant since onset. Pt has had nausea and vomiting. Pt reports having hx of blood clots in 2000 while she was pregnant. Denies taking blood anticoagulants.  Pt indicates symptoms began just after sticking finger w needle, states felt completely fine, at baseline, asymptomatic just prior to sticking finger w needle. No rash or hives. No wheezing or trouble breathing.  Denies leg pain or swelling.      History reviewed. No pertinent past medical history.  Past Surgical History  Procedure Date  . Cesarean section   . Ectopic pregnancy surgery   . Abdominal exploration surgery     No family history on file.  History  Substance Use Topics  . Smoking status: Never Smoker   . Smokeless tobacco: Not on file  . Alcohol Use: Yes    OB History    Grav Para Term Preterm Abortions TAB SAB Ect Mult Living                  Review of Systems  Constitutional: Negative for fever and chills.  Respiratory: Negative for cough and shortness of breath.   Gastrointestinal: Positive for nausea and vomiting. Negative for abdominal pain  and diarrhea.  Musculoskeletal: Negative for back pain.    Allergies  Review of patient's allergies indicates no known allergies.  Home Medications  No current outpatient prescriptions on file.  BP 154/94  Pulse 79  Temp(Src) 99 F (37.2 C) (Oral)  Resp 20  Ht 5\' 5"  (1.651 m)  Wt 154 lb (69.854 kg)  BMI 25.63 kg/m2  SpO2 95%  LMP 06/10/2011  Physical Exam  Nursing note and vitals reviewed. Constitutional: She is oriented to person, place, and time. She appears well-developed and well-nourished. No distress.  HENT:  Head: Normocephalic and atraumatic.  Eyes: Conjunctivae are normal. Pupils are equal, round, and reactive to light.  Neck: Normal range of motion. Neck supple. No tracheal deviation present. No thyromegaly present.  Cardiovascular: Normal rate, regular rhythm, normal heart sounds and intact distal pulses.  Exam reveals no gallop and no friction rub.   No murmur heard. Pulmonary/Chest: Effort normal and breath sounds normal. No respiratory distress.  Abdominal: Soft. She exhibits no distension. There is no tenderness.  Genitourinary:       No cva tenderness  Musculoskeletal: Normal range of motion. She exhibits no edema and no tenderness.       ctls spine non tender. Tiny prick marked to right finger. No sts. No redness. Normal rom digit without pain, normal cap refill distally.   Neurological: She is alert and oriented to  person, place, and time.       Motor intact bil. Steady gait.   Skin: Skin is warm and dry.  Psychiatric:       Pt anxious.     ED Course  Procedures (including critical care time) DIAGNOSTIC STUDIES: Oxygen Saturation is 95% on room air, normal by my interpretation.    COORDINATION OF CARE: 4:32PM EDP discusses pt ED treatment with pt.   Results for orders placed during the hospital encounter of 06/17/11  CBC      Component Value Range   WBC 6.9  4.0 - 10.5 (K/uL)   RBC 4.24  3.87 - 5.11 (MIL/uL)   Hemoglobin 13.3  12.0 - 15.0  (g/dL)   HCT 16.1  09.6 - 04.5 (%)   MCV 89.6  78.0 - 100.0 (fL)   MCH 31.4  26.0 - 34.0 (pg)   MCHC 35.0  30.0 - 36.0 (g/dL)   RDW 40.9  81.1 - 91.4 (%)   Platelets 207  150 - 400 (K/uL)  BASIC METABOLIC PANEL      Component Value Range   Sodium 139  135 - 145 (mEq/L)   Potassium 3.9  3.5 - 5.1 (mEq/L)   Chloride 103  96 - 112 (mEq/L)   CO2 24  19 - 32 (mEq/L)   Glucose, Bld 85  70 - 99 (mg/dL)   BUN 9  6 - 23 (mg/dL)   Creatinine, Ser 7.82  0.50 - 1.10 (mg/dL)   Calcium 9.3  8.4 - 95.6 (mg/dL)   GFR calc non Af Amer >90  >90 (mL/min)   GFR calc Af Amer >90  >90 (mL/min)  POCT I-STAT TROPONIN I      Component Value Range   Troponin i, poc 0.00  0.00 - 0.08 (ng/mL)   Comment 3           D-DIMER, QUANTITATIVE      Component Value Range   D-Dimer, Quant 0.77 (*) 0.00 - 0.48 (ug/mL-FEU)   Dg Chest 2 View  06/17/2011  *RADIOLOGY REPORT*  Clinical Data: Accidental injection with parvovirus vaccine  CHEST - 2 VIEW  Comparison: None  Findings: The heart size and mediastinal contours are within normal limits.  Both lungs are clear.  The visualized skeletal structures are unremarkable.  IMPRESSION: Negative exam.  Original Report Authenticated By: Rosealee Albee, M.D.   Ct Angio Chest W/cm &/or Wo Cm  06/17/2011  *RADIOLOGY REPORT*  Clinical Data: Chest pain and shortness of breath.  Right-sided arm numbness.  CT ANGIOGRAPHY CHEST  Technique:  Multidetector CT imaging of the chest using the standard protocol during bolus administration of intravenous contrast. Multiplanar reconstructed images including MIPs were obtained and reviewed to evaluate the vascular anatomy.  Contrast: OMNIPAQUE IOHEXOL 350 MG/ML SOLN  Comparison: Chest CT 11/19/2005.  Findings:  Mediastinum: There are no filling defects within the pulmonary arterial tree to suggest underlying pulmonary embolism. Heart size is normal. There is no significant pericardial fluid, thickening or pericardial calcification. No  pathologically enlarged mediastinal or hilar lymph nodes. Esophagus is unremarkable in appearance.  Lungs/Pleura: No consolidative airspace disease.  No suspicious appearing pulmonary nodules or masses.  No pleural effusions. Minimal dependent atelectasis in the lower lobes of the lungs bilaterally.  Upper Abdomen: Unremarkable.  Musculoskeletal: There are no aggressive appearing lytic or blastic lesions noted in the visualized portions of the skeleton.  IMPRESSION:  1.  No evidence of pulmonary embolism. 2.  No acute findings in the thorax to  account for the patient's symptoms.  Original Report Authenticated By: Florencia Reasons, M.D.       MDM  I personally performed the services described in this documentation, which was scribed in my presence. The recorded information has been reviewed and considered. Suzi Roots, MD    Date: 06/17/2011  Rate: 75  Rhythm: normal sinus rhythm  QRS Axis: normal  Intervals: normal  ST/T Wave abnormalities: normal  Conduction Disutrbances:none  Narrative Interpretation:   Old EKG Reviewed: unchanged   zofran po.   Labs sent from triage.    Pt now states symptoms somewhat similar to prior dx PE.  ddimer mildly elev. Will get ct chest.    Suzi Roots, MD 06/17/11 [phone]

## 2011-06-17 NOTE — ED Notes (Signed)
Spoke with poison control, tonia.  Per the toxicologist,  Patient sx are unlikely to be related to this exposed and continue to explore other causes.

## 2011-06-17 NOTE — ED Notes (Signed)
Patient states headache pain throbbing 6/10. Clear speech following commands appropriate.

## 2011-06-17 NOTE — ED Notes (Signed)
Patient reports she was giving her dogs their parvo shot and the needle stuck her right index finger.  She is complaining of feeling numbness in her hand, arm, and into her neck.  She also has some pain.  She is also feeling dizziness.

## 2012-06-08 ENCOUNTER — Ambulatory Visit: Payer: 59 | Admitting: Cardiovascular Disease

## 2012-06-08 ENCOUNTER — Encounter: Payer: Self-pay | Admitting: Cardiovascular Disease

## 2012-06-08 ENCOUNTER — Ambulatory Visit (INDEPENDENT_AMBULATORY_CARE_PROVIDER_SITE_OTHER): Payer: 59 | Admitting: Cardiovascular Disease

## 2012-06-08 ENCOUNTER — Encounter: Payer: Self-pay | Admitting: *Deleted

## 2012-06-08 VITALS — BP 160/100 | HR 76 | Wt 150.0 lb

## 2012-06-08 DIAGNOSIS — I422 Other hypertrophic cardiomyopathy: Secondary | ICD-10-CM

## 2012-06-08 DIAGNOSIS — R102 Pelvic and perineal pain: Secondary | ICD-10-CM | POA: Insufficient documentation

## 2012-06-08 DIAGNOSIS — I2699 Other pulmonary embolism without acute cor pulmonale: Secondary | ICD-10-CM | POA: Insufficient documentation

## 2012-06-08 DIAGNOSIS — N946 Dysmenorrhea, unspecified: Secondary | ICD-10-CM | POA: Insufficient documentation

## 2012-06-08 DIAGNOSIS — Z01810 Encounter for preprocedural cardiovascular examination: Secondary | ICD-10-CM | POA: Insufficient documentation

## 2012-06-08 DIAGNOSIS — IMO0002 Reserved for concepts with insufficient information to code with codable children: Secondary | ICD-10-CM | POA: Insufficient documentation

## 2012-06-08 DIAGNOSIS — Z01811 Encounter for preprocedural respiratory examination: Secondary | ICD-10-CM

## 2012-06-08 DIAGNOSIS — N83209 Unspecified ovarian cyst, unspecified side: Secondary | ICD-10-CM | POA: Insufficient documentation

## 2012-06-08 NOTE — Assessment & Plan Note (Signed)
Should have echo to clear for surgery. Check PA pressure and EF  Suspect she will be fine  Normal exam and ECG

## 2012-06-08 NOTE — Patient Instructions (Signed)

## 2012-06-08 NOTE — Progress Notes (Signed)
Patient ID: Nicole Hughes, female   DOB: [birth year], 42 y.o.   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  Chronic abdominal pain In 2000 after giving birth to one of her two daughters had post partum DCM and PE;s.  Describes being on meds for a year and then EF normalized after a year and she has not been on meds or needed f/u  She is very emotional today and seems overwhelmed with her dads health issues. He sees Dr Teressa Lower and needs a heart transplant. Her mom passed and she has not worked in 2 years to help care for him No dyspnea, palpitations, syncope or chest pain.    ROS: Denies fever, malais, weight loss, blurry vision, decreased visual acuity, cough, sputum, SOB, hemoptysis, pleuritic pain, palpitaitons, heartburn, abdominal pain, melena, lower extremity edema, claudication, or rash.  All other systems reviewed and negative   General: Affect appropriate Healthy:  appears stated age HEENT: normal Neck supple with no adenopathy JVP normal no bruits no thyromegaly Lungs clear with no wheezing and good diaphragmatic motion Heart:  S1/S2 no murmur,rub, gallop or click PMI normal Abdomen: benighn, BS positve, no tenderness, no AAA no bruit.  No HSM or HJR Distal pulses intact with no bruits No edema Neuro non-focal Skin warm and dry No muscular weakness  Medications Current Outpatient Prescriptions  Medication Sig Dispense Refill  . CVS IBUPROFEN PO Take by mouth as needed.      . Naproxen Sodium (ALEVE) 220 MG CAPS Take by mouth as needed.       No current facility-administered medications for this visit.    Allergies Review of patient's allergies indicates no known allergies.  Family History: No family history on file.  Social History: History   Social History  . Marital Status: Married    Spouse Name: N/A    Number of Children: N/A  . Years of Education: N/A   Occupational History  . Not on  file.   Social History Main Topics  . Smoking status: Never Smoker   . Smokeless tobacco: Not on file  . Alcohol Use: Yes  . Drug Use: No  . Sexually Active: Not on file   Other Topics Concern  . Not on file   Social History Narrative  . No narrative on file    Electrocardiogram:  NSR rate 76 normal ECG  Assessment and Plan

## 2012-06-09 ENCOUNTER — Ambulatory Visit (INDEPENDENT_AMBULATORY_CARE_PROVIDER_SITE_OTHER): Payer: 59 | Admitting: Family Medicine

## 2012-06-09 ENCOUNTER — Encounter: Payer: Self-pay | Admitting: Family Medicine

## 2012-06-09 VITALS — BP 170/98 | HR 94 | Temp 99.4°F | Ht 64.75 in | Wt 152.5 lb

## 2012-06-09 DIAGNOSIS — R102 Pelvic and perineal pain: Secondary | ICD-10-CM

## 2012-06-09 DIAGNOSIS — Z86711 Personal history of pulmonary embolism: Secondary | ICD-10-CM | POA: Insufficient documentation

## 2012-06-09 DIAGNOSIS — I10 Essential (primary) hypertension: Secondary | ICD-10-CM | POA: Insufficient documentation

## 2012-06-09 MED ORDER — LISINOPRIL 10 MG PO TABS: 10.0000 mg | ORAL_TABLET | Freq: Every day | ORAL | Status: DC

## 2012-06-09 NOTE — Progress Notes (Signed)
Subjective:    Patient ID: Nicole Hughes, female    DOB: 03-Mar-1970, 42 y.o.   MRN: [id number]  HPI Here to establish as a new pt  Sees  cardiology - Dr Eden Emms   Hx of pelvic pain / likely endometriosis -sees Dr Ellyn Hack  Getting ready for lap surgery- saw cardiology yesterday  Has echo planned on Wednesday - pending result    Had 2 blood clots with pregnancy in the past  Was on coumadin for a year  Had one blood clot in arm and one in leg  Was worked up for a clotting disorder - heme and cardiac work ups   Not on any hormones currently  Took progesterone only pill- and did not tolerate it  Then had BTL after ectopic  Does not need birth control     Hx of blood clot/ PE in the past   Hx of cardiomyopathy  (post partum cardiomyopathy)  Off heart medication since 2001   bp today is high She is in chronic pain  Ibuprofen 800 mg bid  BP Readings from Last 3 Encounters:  06/09/12 170/98  06/08/12 160/100  2011-07-12 187/99    Mother died of lung cancer Father has chf and HTN and she cares for him  He will be getting on the list for a heart transplant   Health mt  Last pap smear with Dr Ellyn Hack- was in Feb Will get her first mammo later in the year  Td was 2010  No labs done in a while   Patient Active Problem List   Diagnosis Date Noted  . Unspecified essential hypertension 06/09/2012  . History of pulmonary embolism 06/09/2012  . Preop cardiovascular exam 06/08/2012  . Dysmenorrhea   . Dyspareunia   . Pelvic pain   . Pulmonary embolism   . Cardiomyopathy   . Cyst of ovary    Past Medical History  Diagnosis Date  . Dysmenorrhea   . Dyspareunia   . Pelvic pain   . Pulmonary embolism   . Cardiomyopathy   . Cyst of ovary   . History of chicken pox   . History of depression   . History of migraine     and frequent Headaches  . History of hay fever   . History of blood clots    Past Surgical History  Procedure Laterality Date  . Cesarean  section  1994  . Ectopic pregnancy surgery    . Abdominal exploration surgery    . Tubal ligation  2010   History  Substance Use Topics  . Smoking status: Never Smoker   . Smokeless tobacco: Not on file  . Alcohol Use: Yes     Comment: occ   Family History  Problem Relation Age of Onset  . Cancer - Lung Mother   . Heart failure Father   . Hypertension Father    No Known Allergies Current Outpatient Prescriptions on File Prior to Visit  Medication Sig Dispense Refill  . CVS IBUPROFEN PO Take by mouth as needed.       No current facility-administered medications on file prior to visit.    Review of Systems Review of Systems  Constitutional: Negative for fever, appetite change, fatigue and unexpected weight change.  Eyes: Negative for pain and visual disturbance.  Respiratory: Negative for cough and shortness of breath.   Cardiovascular: Negative for cp or palpitations    Gastrointestinal: Negative for nausea, diarrhea and constipation. pos for chronic pelvic pain that is constant and  worse during menses, also pos for heavy menses  Genitourinary: Negative for urgency and frequency.  Skin: Negative for pallor or rash   Neurological: Negative for weakness, light-headedness, numbness and headaches.  Hematological: Negative for adenopathy. Does not bruise/bleed easily.  Psychiatric/Behavioral: Negative for dysphoric mood. The patient is not nervous/anxious.         Objective:   Physical Exam  Constitutional: She appears well-developed and well-nourished. No distress.  HENT:  Head: Normocephalic and atraumatic.  Right Ear: External ear normal.  Left Ear: External ear normal.  Nose: Nose normal.  Mouth/Throat: Oropharynx is clear and moist.  Eyes: Conjunctivae and EOM are normal. Pupils are equal, round, and reactive to light. Right eye exhibits no discharge. Left eye exhibits no discharge. No scleral icterus.  Neck: Normal range of motion. Neck supple. No JVD present.  Carotid bruit is not present. Erythema present. No thyromegaly present.  Cardiovascular: Normal rate, regular rhythm, normal heart sounds and intact distal pulses.  Exam reveals no gallop.   Pulmonary/Chest: Effort normal and breath sounds normal. No respiratory distress. She has no wheezes.  Abdominal: Soft. Bowel sounds are normal. She exhibits no abdominal bruit and no mass. There is tenderness. There is no rebound and no guarding.  No suprapubic tenderness or fullness    Musculoskeletal: She exhibits no edema and no tenderness.  Lymphadenopathy:    She has no cervical adenopathy.  Neurological: She is alert.  Skin: Skin is warm and dry. No rash noted. No erythema. No pallor.  Psychiatric: Her mood appears anxious. She exhibits a depressed mood.  Seems a bit anx/ depressed due to situational stressors and also in pain currently- a bit tearful at times but good eye contact and mentally sharp          Assessment & Plan:

## 2012-06-09 NOTE — Patient Instructions (Addendum)
Please send for notes and labs from [facility] (if avail) 1999-2001 Labs today  Start lisinopril 10 mg once pill daily in am  Call us in 1 week with blood pressure readings at home - and if side effects- let me know

## 2012-06-10 LAB — CBC WITH DIFFERENTIAL/PLATELET
Basophils Absolute: 0 10*3/uL (ref 0.0–0.1)
Basophils Relative: 0.4 % (ref 0.0–3.0)
Eosinophils Absolute: 0.1 10*3/uL (ref 0.0–0.7)
Eosinophils Relative: 2.2 % (ref 0.0–5.0)
HCT: 38.3 % (ref 36.0–46.0)
Hemoglobin: 12.9 g/dL (ref 12.0–15.0)
Lymphocytes Relative: 32.2 % (ref 12.0–46.0)
Lymphs Abs: 2.1 10*3/uL (ref 0.7–4.0)
MCHC: 33.8 g/dL (ref 30.0–36.0)
MCV: 95.5 fl (ref 78.0–100.0)
Monocytes Absolute: 0.4 10*3/uL (ref 0.1–1.0)
Monocytes Relative: 6.4 % (ref 3.0–12.0)
Neutro Abs: 3.8 10*3/uL (ref 1.4–7.7)
Neutrophils Relative %: 58.8 % (ref 43.0–77.0)
Platelets: 181 10*3/uL (ref 150.0–400.0)
RBC: 4.01 Mil/uL (ref 3.87–5.11)
RDW: 12.4 % (ref 11.5–14.6)
WBC: 6.5 10*3/uL (ref 4.5–10.5)

## 2012-06-10 LAB — COMPREHENSIVE METABOLIC PANEL
ALT: 31 U/L (ref 0–35)
AST: 29 U/L (ref 0–37)
Albumin: 4.4 g/dL (ref 3.5–5.2)
Alkaline Phosphatase: 32 U/L — ABNORMAL LOW (ref 39–117)
BUN: 8 mg/dL (ref 6–23)
CO2: 27 mEq/L (ref 19–32)
Calcium: 9.8 mg/dL (ref 8.4–10.5)
Chloride: 107 mEq/L (ref 96–112)
Creatinine, Ser: 0.7 mg/dL (ref 0.4–1.2)
GFR: 98.41 mL/min (ref >60.00)
Glucose, Bld: 76 mg/dL (ref 70–99)
Potassium: 4.3 mEq/L (ref 3.5–5.1)
Sodium: 141 mEq/L (ref 135–145)
Total Bilirubin: 0.4 mg/dL (ref 0.3–1.2)
Total Protein: 6.9 g/dL (ref 6.0–8.3)

## 2012-06-10 LAB — LIPID PANEL
Cholesterol: 176 mg/dL (ref 0–200)
HDL: 51.8 mg/dL (ref >39.00)
LDL Cholesterol: 84 mg/dL (ref 0–99)
Total CHOL/HDL Ratio: 3
Triglycerides: 199 mg/dL — ABNORMAL HIGH (ref 0.0–149.0)
VLDL: 39.8 mg/dL (ref 0.0–40.0)

## 2012-06-10 LAB — APTT: aPTT: 28.2 s (ref 21.7–28.8)

## 2012-06-10 LAB — PROTIME-INR
INR: 0.9 ratio (ref 0.8–1.0)
Prothrombin Time: 10 s — ABNORMAL LOW (ref 10.2–12.4)

## 2012-06-10 LAB — TSH: TSH: 0.87 u[IU]/mL (ref 0.35–5.50)

## 2012-06-10 NOTE — Assessment & Plan Note (Signed)
With last pregnancy years ago  Per pt , she had neg hypercoag w/u with [facility] and was taken off coumadin after a year  Sent for those records to clear her for upcoming surgery

## 2012-06-10 NOTE — Assessment & Plan Note (Signed)
Pt thinks this may be due to pain - but bp up significantly with last 3 epic visits and she has fam hx Lab today- disc poss 2ndary causes (doubtful) Will start lisinopril 10 and update if side eff- check bp at home and call next week with readings Echo is pending from cardiol due to remote hx of pregnancy related cardiomyopathy

## 2012-06-10 NOTE — Assessment & Plan Note (Signed)
Planning laparoscopy to r/o endometriosis currently and pain is severe at times  Will rev records from Dr Ellyn Hack  Pending echo for surgical clearance  Lab today and bp control necessary

## 2012-06-11 ENCOUNTER — Encounter: Payer: Self-pay | Admitting: *Deleted

## 2012-06-16 ENCOUNTER — Telehealth: Payer: Self-pay

## 2012-06-16 ENCOUNTER — Ambulatory Visit (HOSPITAL_COMMUNITY): Payer: 59 | Attending: Cardiology | Admitting: Radiology

## 2012-06-16 DIAGNOSIS — Z01811 Encounter for preprocedural respiratory examination: Secondary | ICD-10-CM

## 2012-06-16 DIAGNOSIS — Z01810 Encounter for preprocedural cardiovascular examination: Secondary | ICD-10-CM

## 2012-06-16 DIAGNOSIS — Z86711 Personal history of pulmonary embolism: Secondary | ICD-10-CM | POA: Insufficient documentation

## 2012-06-16 DIAGNOSIS — I422 Other hypertrophic cardiomyopathy: Secondary | ICD-10-CM | POA: Insufficient documentation

## 2012-06-16 DIAGNOSIS — I428 Other cardiomyopathies: Secondary | ICD-10-CM

## 2012-06-16 DIAGNOSIS — I10 Essential (primary) hypertension: Secondary | ICD-10-CM | POA: Insufficient documentation

## 2012-06-16 NOTE — Telephone Encounter (Signed)
Pt notified of Dr. Royden Purl recommendations and to increase lisinopril to 2 tab qd, pt will call back tomorrow with an update of her sxs

## 2012-06-16 NOTE — Telephone Encounter (Signed)
Spoke with Dr Milinda Antis advised pt to call GYN for pain management of abdominal pain; For h/a and SOB if condition worsens or changes pt to call 911. Pt voiced understanding. Pt request call back re: BP readings.

## 2012-06-16 NOTE — Telephone Encounter (Signed)
Pt having severe abd pain below waist; having pain 25 days out of the month for 2 years. Pain level is 11 ? endometriosis. BP readings  06/10/12 BP 164/88,   06/12/12 BP 166/90,   06/14/12 BP 162/92 and today 172/100. Pt seeing black spots and her head hurts and SOB.Please advise. Pt using heating pad and taking ibuprofen 200 mg usually takes 2 tabs. Pt cannot afford an appt  And has no transportation today. When daughter comes home pt may go to [employer].Please advise.  pt said she is afraid of her BP being elevated and by herself at home

## 2012-06-16 NOTE — Progress Notes (Signed)
Echocardiogram performed.  

## 2012-06-16 NOTE — Telephone Encounter (Signed)
Tell her to contact her gyn about pain control if necessary  Increase the lisinopril I gave her to 2 pills daily (for total dose of 20 mg)  If sob persists - she does need to be seen urgently in ER or urgent care when she has transportation- or call 911 if necessary  Please call us tomorrow with an update

## 2012-06-23 ENCOUNTER — Telehealth: Payer: Self-pay | Admitting: Cardiovascular Disease

## 2012-06-23 NOTE — Telephone Encounter (Signed)
Pt to be scheduled for laparoscopy by Dr Ellyn Hack. She is requesting copy of Dr Fabio Bering recent office note and echo. I will forward these reports to Dr Ellyn Hack.

## 2012-06-23 NOTE — Telephone Encounter (Signed)
New problem   [facility] need cardiac clearance on pt fax to [phone].

## 2012-06-23 NOTE — Telephone Encounter (Signed)
LMTCB

## 2012-06-28 ENCOUNTER — Telehealth: Payer: Self-pay | Admitting: *Deleted

## 2012-06-28 NOTE — Telephone Encounter (Signed)
Dr. Milinda Antis sent me a message to call pt and see how her BP has been doing. I called pt but no answer so left voicemail requesting pt to call office

## 2012-07-01 NOTE — Telephone Encounter (Signed)
Left 2nd voicemail requesting pt to call office  

## 2012-07-06 NOTE — Telephone Encounter (Signed)
Left 3rd voicemail and also mailed letter requesting pt to call and update [place] on BP reading

## 2012-07-07 MED ORDER — AMLODIPINE BESYLATE 5 MG PO TABS: 5.0000 mg | ORAL_TABLET | Freq: Every day | ORAL | Status: DC

## 2012-07-07 NOTE — Telephone Encounter (Signed)
Pt said her BP isn't doing any better, pt said she check it today and it was 178/106, pt said last week she increased her lisinopril and is taking 30mg  every day and it hasn't helped at all

## 2012-07-07 NOTE — Telephone Encounter (Signed)
In that case I want to add a 2nd medicine- amlodipine 5 mg - please call it in  Start this one pill daily in ams in addition to the lisinopril and please f/u with me next week

## 2012-07-08 MED ORDER — AMLODIPINE BESYLATE 5 MG PO TABS: 5.0000 mg | ORAL_TABLET | Freq: Every day | ORAL | Status: DC

## 2012-07-08 NOTE — Telephone Encounter (Signed)
Rx sent to pharmacy and pt notified, f/u appt scheduled for 07/19/12

## 2012-07-08 NOTE — Telephone Encounter (Signed)
Left voicemail requesting pt to call office 

## 2012-07-14 ENCOUNTER — Telehealth: Payer: Self-pay

## 2012-07-14 MED ORDER — ACYCLOVIR 5 % EX CREA: 1.0000 "application " | TOPICAL_CREAM | CUTANEOUS | Status: DC

## 2012-07-14 NOTE — Telephone Encounter (Signed)
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 Pt has appt on 07/19/12 with Dr Milinda Antis. Pt under a lot of stress and lower lip is covered in cold sores, lower lip is swollen. Pt said 10 years ago was given cream (does not remember name of med) and pt request cream sent to [facility] on [address].pt said cream works better than oral med.Please advise. Pt request cb.

## 2012-07-14 NOTE — Telephone Encounter (Signed)
Rx called in as prescribed 

## 2012-07-14 NOTE — Telephone Encounter (Signed)
Let's try zovirax cream and see if it helps Will sent to pharmacy electronically

## 2012-07-19 ENCOUNTER — Ambulatory Visit: Payer: 59 | Admitting: Family Medicine

## 2012-07-23 ENCOUNTER — Encounter: Payer: Self-pay | Admitting: Family Medicine

## 2012-07-23 ENCOUNTER — Ambulatory Visit (INDEPENDENT_AMBULATORY_CARE_PROVIDER_SITE_OTHER): Payer: 59 | Admitting: Family Medicine

## 2012-07-23 ENCOUNTER — Ambulatory Visit: Payer: Self-pay | Admitting: Family Medicine

## 2012-07-23 VITALS — BP 162/86 | HR 112 | Temp 99.6°F | Ht 64.75 in | Wt 147.5 lb

## 2012-07-23 DIAGNOSIS — F430 Acute stress reaction: Secondary | ICD-10-CM

## 2012-07-23 DIAGNOSIS — I10 Essential (primary) hypertension: Secondary | ICD-10-CM

## 2012-07-23 MED ORDER — BUSPIRONE HCL 15 MG PO TABS: 7.5000 mg | ORAL_TABLET | Freq: Two times a day (BID) | ORAL | Status: DC

## 2012-07-23 MED ORDER — HYDROCHLOROTHIAZIDE 25 MG PO TABS: 25.0000 mg | ORAL_TABLET | Freq: Every day | ORAL | Status: DC

## 2012-07-23 NOTE — Progress Notes (Signed)
Subjective:    Patient ID: Nicole Hughes, female    DOB: 20-Oct-1970, 42 y.o.   MRN: [id number]  HPI Here for f/u of HTN and some other problems   bp is stable today  No cp or palpitations or edema   Dark spot in vision -bothersome for over a month - in L eye  No side effects to medicines  BP Readings from Last 3 Encounters:  07/23/12 162/86  06/09/12 170/98  06/08/12 160/100    Is on lisinopril now and bp is coming down At first med made her woozy and now is doing better  bp at home are 160s/ 80s for the most part  Headache- moreso from stress and also blood pressure  Neck and shoulders keep tension when she is anxious   Anxiety- thinks she is having anxiety attacks  In past -had anx when she lost her mother -- and she saw a psychiatrist -was on a bunch of medicine  Has a counseling group at the [place] that she visits with her dad ( he needs a heart transplant)  meds in past incl xanax/ valium/ ? Tricyclic / and zoloft    Dealing with pelvic pain and planning surgery - also has to take care of her dad -needs a heart transplant and she is the only caregiver   Patient Active Problem List   Diagnosis Date Noted  . Unspecified essential hypertension 06/09/2012  . History of pulmonary embolism 06/09/2012  . Preop cardiovascular exam 06/08/2012  . Dysmenorrhea   . Dyspareunia   . Pelvic pain   . Cardiomyopathy   . Cyst of ovary    Past Medical History  Diagnosis Date  . Dysmenorrhea   . Dyspareunia   . Pelvic pain   . Pulmonary embolism   . Cardiomyopathy   . Cyst of ovary   . History of chicken pox   . History of depression   . History of migraine     and frequent Headaches  . History of hay fever   . History of blood clots    Past Surgical History  Procedure Laterality Date  . Cesarean section  1994  . Ectopic pregnancy surgery    . Abdominal exploration surgery    . Tubal ligation  2010   History  Substance Use Topics  . Smoking status: Never Smoker   .  Smokeless tobacco: Not on file  . Alcohol Use: Yes     Comment: occ   Family History  Problem Relation Age of Onset  . Cancer - Lung Mother   . Heart failure Father   . Hypertension Father    No Known Allergies Current Outpatient Prescriptions on File Prior to Visit  Medication Sig Dispense Refill  . acyclovir cream (ZOVIRAX) 5 % Apply 1 application topically every 3 (three) hours.  15 g  0  . amLODipine (NORVASC) 5 MG tablet Take 1 tablet (5 mg total) by mouth daily.  30 tablet  3  . CVS IBUPROFEN PO Take by mouth as needed.      . Diphenhydramine-APAP, sleep, (TYLENOL PM EXTRA STRENGTH PO) Take by mouth as needed.      Marland Kitchen lisinopril (PRINIVIL,ZESTRIL) 10 MG tablet Take 1 tablet (10 mg total) by mouth daily.  30 tablet  2   No current facility-administered medications on file prior to visit.     Review of Systems Review of Systems  Constitutional: Negative for fever, appetite change, fatigue and unexpected weight change.  Eyes: Negative for  pain and visual disturbance.  Respiratory: Negative for cough and shortness of breath.   Cardiovascular: Negative for cp or palpitations    Gastrointestinal: Negative for nausea, diarrhea and constipation.  Genitourinary: Negative for urgency and frequency.  Skin: Negative for pallor or rash   Neurological: Negative for weakness, light-headedness, numbness and pos for tension  headaches.  Hematological: Negative for adenopathy. Does not bruise/bleed easily.  Psychiatric/Behavioral: Negative for dysphoric mood. The patient is quite anxious with some panic attacks        Objective:   Physical Exam  Constitutional: She appears well-developed and well-nourished. No distress.  HENT:  Head: Normocephalic and atraumatic.  Mouth/Throat: Oropharynx is clear and moist.  Eyes: Conjunctivae and EOM are normal. Pupils are equal, round, and reactive to light. Right eye exhibits no discharge. Left eye exhibits no discharge. No scleral icterus.  Neck:  Normal range of motion. Neck supple. No JVD present. Carotid bruit is not present. No thyromegaly present.  Cardiovascular: Normal rate, regular rhythm, normal heart sounds and intact distal pulses.  Exam reveals no gallop.   Pulmonary/Chest: Effort normal and breath sounds normal. No respiratory distress. She has no wheezes. She has no rales.  No crackles  Abdominal: Soft. Bowel sounds are normal. She exhibits no abdominal bruit.  Musculoskeletal: She exhibits no edema.  Tension in neck muscles and trapezius with nl rom  Lymphadenopathy:    She has no cervical adenopathy.  Neurological: She is alert. She has normal reflexes. She displays tremor. No cranial nerve deficit. She exhibits normal muscle tone. Coordination normal.  Mild hand tremor-pt is nervous  Skin: Skin is warm and dry. No rash noted. No erythema. No pallor.  Psychiatric: Her speech is normal and behavior is normal. Her mood appears anxious. Her affect is not blunt and not labile. Thought content is not paranoid. She does not exhibit a depressed mood. She expresses no homicidal and no suicidal ideation.  Anxious but pleasant and talkative           Assessment & Plan:

## 2012-07-23 NOTE — Patient Instructions (Addendum)
For anxiety/ stress reaction-try buspar 1/2 pill twice daily - if you have side effects or feel worse (depression or anxiety)- stop the medicine and let me know  Keep going to your support group and start writing in a journal  Continue lisinopril Add hctz 25 mg each am - you will urinate more  Keep watching blood pressure Take care of yourself  Follow up with me in about a month or so for visit and labs

## 2012-07-25 NOTE — Assessment & Plan Note (Signed)
Improved but not at goal- and adding to ha Add hctz 25 mg daily- if working will move to combo pill with ace  Disc exp of inc urination F/u and lab planned Disc lifestyle change

## 2012-07-25 NOTE — Assessment & Plan Note (Addendum)
With anxiety - in setting of caring for sick father who needs a heart transplant  Is getting counseling through a group right now  Disc med trials in past  Disc symptoms/ coping tech/ stressors (incl chronic pain)/ med choices and poss side eff in detail Will try buspar 7.5 mg bid and report back F/u planned  Pt aware if SI or worse to stop med and call  >25 min spent with face to face with patient, >50% counseling and/or coordinating care

## 2012-08-05 ENCOUNTER — Ambulatory Visit: Payer: 59 | Admitting: Cardiovascular Disease

## 2012-08-13 ENCOUNTER — Other Ambulatory Visit: Payer: Self-pay | Admitting: *Deleted

## 2012-08-13 MED ORDER — LISINOPRIL 10 MG PO TABS: 10.0000 mg | ORAL_TABLET | Freq: Every day | ORAL | Status: DC

## 2012-08-20 ENCOUNTER — Ambulatory Visit (INDEPENDENT_AMBULATORY_CARE_PROVIDER_SITE_OTHER): Payer: 59 | Admitting: Family Medicine

## 2012-08-20 ENCOUNTER — Encounter: Payer: Self-pay | Admitting: Family Medicine

## 2012-08-20 ENCOUNTER — Ambulatory Visit (INDEPENDENT_AMBULATORY_CARE_PROVIDER_SITE_OTHER)
Admission: RE | Admit: 2012-08-20 | Discharge: 2012-08-20 | Disposition: A | Discharge status: Home / Self Care | Payer: 59 | Source: Ambulatory Visit | Attending: Family Medicine | Admitting: Family Medicine

## 2012-08-20 ENCOUNTER — Encounter: Payer: Self-pay | Admitting: *Deleted

## 2012-08-20 VITALS — BP 110/76 | HR 92 | Temp 98.7°F | Ht 64.75 in | Wt 150.0 lb

## 2012-08-20 DIAGNOSIS — R0789 Other chest pain: Secondary | ICD-10-CM

## 2012-08-20 DIAGNOSIS — R071 Chest pain on breathing: Secondary | ICD-10-CM

## 2012-08-20 DIAGNOSIS — R079 Chest pain, unspecified: Secondary | ICD-10-CM

## 2012-08-20 MED ORDER — CYCLOBENZAPRINE HCL 10 MG PO TABS: 10.0000 mg | ORAL_TABLET | Freq: Three times a day (TID) | ORAL | Status: DC | PRN

## 2012-08-20 NOTE — Progress Notes (Signed)
Subjective:    Patient ID: Nicole Hughes, female    DOB: [birth year], 42 y.o.   MRN: [id number]  HPI Here with chest discomfort   Monday -at breakfast her chest got tight -- lasted 30 min and went away  As went on -more frequent and longer lasting episodes  Dull pain - through chest to back  No stomach pain and no heartburn Feels a bit sob and dizzy   Twice this week she had a hot moment with sweating   Finally gets to schedule her surgery (GYN today)   Stress wise- father is doing better  Anxiety is fair - not worse   Does NOT feel like she felt with the PE (back then more sob and orthopnea and she had pain at site of clots)  It hurts to take a deep breath and move    Patient Active Problem List   Diagnosis Date Noted  . Stress reaction 07/23/2012  . Unspecified essential hypertension 06/09/2012  . History of pulmonary embolism 06/09/2012  . Preop cardiovascular exam 06/08/2012  . Dysmenorrhea   . Dyspareunia   . Pelvic pain   . Cardiomyopathy   . Cyst of ovary    Past Medical History  Diagnosis Date  . Dysmenorrhea   . Dyspareunia   . Pelvic pain   . Pulmonary embolism   . Cardiomyopathy   . Cyst of ovary   . History of chicken pox   . History of depression   . History of migraine     and frequent Headaches  . History of hay fever   . History of blood clots    Past Surgical History  Procedure Laterality Date  . Cesarean section  1994  . Ectopic pregnancy surgery    . Abdominal exploration surgery    . Tubal ligation  2010   History  Substance Use Topics  . Smoking status: Never Smoker   . Smokeless tobacco: Not on file  . Alcohol Use: Yes     Comment: occ   Family History  Problem Relation Age of Onset  . Cancer - Lung Mother   . Heart failure Father   . Hypertension Father    No Known Allergies Current Outpatient Prescriptions on File Prior to Visit  Medication Sig Dispense Refill  . acyclovir cream (ZOVIRAX) 5 % Apply 1 application  topically every 3 (three) hours.  15 g  0  . amLODipine (NORVASC) 5 MG tablet Take 1 tablet (5 mg total) by mouth daily.  30 tablet  3  . busPIRone (BUSPAR) 15 MG tablet Take 0.5 tablets (7.5 mg total) by mouth 2 (two) times daily.  30 tablet  5  . CVS IBUPROFEN PO Take by mouth as needed.      . Diphenhydramine-APAP, sleep, (TYLENOL PM EXTRA STRENGTH PO) Take by mouth as needed.      . hydrochlorothiazide (HYDRODIURIL) 25 MG tablet Take 1 tablet (25 mg total) by mouth daily.  30 tablet  5  . lisinopril (PRINIVIL,ZESTRIL) 10 MG tablet Take 1 tablet (10 mg total) by mouth daily.  30 tablet  5   No current facility-administered medications on file prior to visit.      Review of Systems     Objective:   Physical Exam  Constitutional: She appears well-developed and well-nourished. No distress.  Uncomfortable to take a deep breath  HENT:  Head: Normocephalic and atraumatic.  Mouth/Throat: Oropharynx is clear and moist.  Eyes: Conjunctivae and EOM are normal. Pupils are  equal, round, and reactive to light. No scleral icterus.  Neck: Normal range of motion. Neck supple. No JVD present. Carotid bruit is not present. No thyromegaly present.  Cardiovascular: Normal rate, regular rhythm, normal heart sounds and intact distal pulses.   Rate of 79 on EKG NSR  Pulmonary/Chest: Effort normal and breath sounds normal. No respiratory distress. She has no wheezes. She has no rales. She exhibits tenderness.  No crackles  Anterior bilat chest tenderness-no skin change or crepitus  Abdominal: Soft. Bowel sounds are normal. She exhibits no distension and no mass. There is no tenderness.  Musculoskeletal: She exhibits no edema and no tenderness.  No swelling or tenderness in legs No palp cords  Lymphadenopathy:    She has no cervical adenopathy.  Neurological: She is alert. She has normal reflexes. No cranial nerve deficit. She exhibits normal muscle tone. Coordination normal.  Skin: Skin is warm and  dry. No rash noted. No erythema. No pallor.  Psychiatric: She has a normal mood and affect.          Assessment & Plan:

## 2012-08-20 NOTE — Patient Instructions (Addendum)
Continue ibuprofen (take it with food) Flexeril -with caution as needed (muscle relaxer) Use heat gently on your chest  Chest xray today  D dimer test today We will update you with results If worse or new symptoms let me know

## 2012-08-21 LAB — D-DIMER, QUANTITATIVE: D-Dimer, Quant: 0.56 ug/mL-FEU — ABNORMAL HIGH (ref 0.00–0.48)

## 2012-08-22 NOTE — Assessment & Plan Note (Signed)
With tenderness  Nl EKG and no sob (though pain is pleuritic) Nl cxr  Sent D Dimer in light of hx of PE -but pt states this does not feel like that at all  Adv warm compress/ nsaid/ update Disc need for deep breaths to prevent atelectasis

## 2012-08-24 ENCOUNTER — Telehealth: Payer: Self-pay | Admitting: Family Medicine

## 2012-08-24 DIAGNOSIS — L600 Ingrowing nail: Secondary | ICD-10-CM | POA: Insufficient documentation

## 2012-08-24 NOTE — Telephone Encounter (Signed)
Patient would like to be referred to a Podiatrist for a ingrown toe nail ASAP, tomorrow afternoon if possible.

## 2012-08-25 NOTE — Progress Notes (Signed)
Medical clearance from Dr. Eden Emms received.  Place in black wire bin on wall by fax machine per Janet's request

## 2012-09-07 ENCOUNTER — Encounter (HOSPITAL_COMMUNITY): Payer: Self-pay | Admitting: Pharmacist

## 2012-09-21 ENCOUNTER — Encounter (HOSPITAL_COMMUNITY)
Admission: RE | Admit: 2012-09-21 | Discharge: 2012-09-21 | Disposition: A | Discharge status: Home / Self Care | Payer: 59 | Source: Ambulatory Visit | Attending: Obstetrics and Gynecology | Admitting: Obstetrics and Gynecology

## 2012-09-21 ENCOUNTER — Encounter (HOSPITAL_COMMUNITY): Payer: Self-pay

## 2012-09-21 HISTORY — DX: Other complications of anesthesia, initial encounter: T88.59XA

## 2012-09-21 HISTORY — DX: Adverse effect of unspecified anesthetic, initial encounter: T41.45XA

## 2012-09-21 LAB — CBC
HCT: 39.6 % (ref 36.0–46.0)
Hemoglobin: 14 g/dL (ref 12.0–15.0)
MCH: 32 pg (ref 26.0–34.0)
MCHC: 35.4 g/dL (ref 30.0–36.0)
MCV: 90.6 fL (ref 78.0–100.0)
Platelets: 211 10*3/uL (ref 150–400)
RBC: 4.37 MIL/uL (ref 3.87–5.11)
RDW: 12.2 % (ref 11.5–15.5)
WBC: 6.5 10*3/uL (ref 4.0–10.5)

## 2012-09-21 LAB — BASIC METABOLIC PANEL
BUN: 7 mg/dL (ref 6–23)
CO2: 25 mEq/L (ref 19–32)
Calcium: 9.9 mg/dL (ref 8.4–10.5)
Chloride: 100 mEq/L (ref 96–112)
Creatinine, Ser: 0.62 mg/dL (ref 0.50–1.10)
GFR calc Af Amer: >90 mL/min (ref >90)
GFR calc non Af Amer: >90 mL/min (ref >90)
Glucose, Bld: 93 mg/dL (ref 70–99)
Potassium: 3.5 mEq/L (ref 3.5–5.1)
Sodium: 138 mEq/L (ref 135–145)

## 2012-09-21 NOTE — Patient Instructions (Addendum)
20 [name]  09/21/2012   Your procedure is scheduled on:  09/24/12  Enter through the Main Entrance of [facility] at 730 AM.  Pick up the phone at the desk and dial [phone].   Call this number if you have problems the morning of surgery: [phone]   Remember:   Do not eat food:After Midnight.  Do not drink clear liquids: After Midnight.  Take these medicines the morning of surgery with A SIP OF WATER: take BP medications and may take Buspar   Do not wear jewelry, make-up or nail polish.  Do not wear lotions, powders, or perfumes. You may wear deodorant.  Do not shave 48 hours prior to surgery.  Do not bring valuables to the hospital.  [facility] is not   responsible for any belongings or valuables brought to the hospital.  Contacts, dentures or bridgework may not be worn into surgery.  Leave suitcase in the car. After surgery it may be brought to your room.  For patients admitted to the hospital, checkout time is 11:00 AM the day of              discharge.   Patients discharged the day of surgery will not be allowed to drive             home.  Name and phone number of your driver: husband  [name]  Special Instructions:   Shower using CHG 2 nights before surgery and the night before surgery.  If you shower the day of surgery use CHG.  Use special wash - you have one bottle of CHG for all showers.  You should use approximately 1/3 of the bottle for each shower.   Please read over the following fact sheets that you were given:   Surgical Site Infection Prevention

## 2012-09-23 NOTE — H&P (Addendum)
Nicole Hughes is an 42 y.o. female  [phone] with pelvic pain for diagnostic laparoscopy.  Pt has dyspareunia, dysmenorrhea, and pekvic pain.  Complex medical history with h/o PE and cardiomyopathy - has medical and cardiology clearance for surgery.  D/w pt r/b/a of diagnostic laparoscopy.  Possible chance of endometriosis - possible treatment with fulguration.    Pertinent Gynecological History: Menses: flow is moderate, regular every month without intermenstrual spotting and with severe dysmenorrhea Contraception: tubal ligation Blood transfusions: none Sexually transmitted diseases: no past history Previous GYN Procedures: DNC and laparoscopy andsalpingectomy Last mammogram:normal Date: due 2014 Last pap: abnormal: LGSIL had colpo  OB History: G4, P1122; G1 TAB, G2 c/s, G3 VBAC, G4 ectopic   Menstrual History: Menarche age: 64 No LMP recorded.    Past Medical History  Diagnosis Date  . Dysmenorrhea   . Dyspareunia   . Pelvic pain   . Pulmonary embolism   . Cardiomyopathy   . Cyst of ovary   . History of chicken pox   . History of depression   . History of migraine     and frequent Headaches  . History of hay fever   . History of blood clots   . Complication of anesthesia     "hard to get under"    Past Surgical History  Procedure Laterality Date  . Cesarean section  1994  . Ectopic pregnancy surgery    . Abdominal exploration surgery    . Tubal ligation  2010    Family History  Problem Relation Age of Onset  . Cancer - Lung Mother   . Heart failure Father   . Hypertension Father     Social History:  reports that she has never smoked. She does not have any smokeless tobacco history on file. She reports that  drinks alcohol. She reports that she does not use illicit drugs.married, caregiver   Allergies: No Known Allergies  Meds Lisinopril 10mg , percocet, norco, buspar 15mg , tylenol PM, amlodipine 5mg , HCTZ 25mg , ibuprofen  No prescriptions prior to admission     Review of Systems  Constitutional: Negative.   HENT: Negative.   Eyes: Negative.   Respiratory: Negative.   Cardiovascular: Negative.   Gastrointestinal: Negative.   Genitourinary: Negative.   Musculoskeletal: Negative.   Skin: Negative.   Neurological: Negative.   Psychiatric/Behavioral: Negative.     There were no vitals taken for this visit. Physical Exam  Constitutional: She is oriented to person, place, and time. She appears well-developed and well-nourished.  HENT:  Head: Normocephalic and atraumatic.  Cardiovascular: Normal rate and regular rhythm.   Respiratory: Effort normal and breath sounds normal. No respiratory distress.  GI: Soft. Bowel sounds are normal. There is no tenderness.  Musculoskeletal: Normal range of motion.  Neurological: She is alert and oriented to person, place, and time.  Skin: Skin is warm and dry.  Psychiatric: She has a normal mood and affect. Her behavior is normal.   H/o complex ovarian cyst, resolved on [place] in 2/14   Assessment/Plan: 42yo [id number] with pelvic pain, dysmenorrhea for diagnostic laparoscopy Pt with complex medical history with PE and cardiomyopathy, has had medical and cardiology clearance.  D/w pt r/b/a, of laparoscopy - pt wishes to proceed.    [name],Nicole Hughes 09/23/2012, 8:49 PM   Given h/o PE/ cardiomyopathy considering postoperative prophylaxis, spoke with Dr. Sampson Goon, [facility].  States if OP surgery and able to ambulate, no need for prophylaxis postoperatively.  If need to open and admit - if would be  bed-bound, plan for Lovenox or Heparin SQ for prophylaxis.

## 2012-09-24 ENCOUNTER — Encounter (HOSPITAL_COMMUNITY)
Admission: RE | Disposition: A | Discharge status: Home / Self Care | Payer: Self-pay | Source: Ambulatory Visit | Attending: Obstetrics and Gynecology

## 2012-09-24 ENCOUNTER — Ambulatory Visit (HOSPITAL_COMMUNITY)
Admission: RE | Admit: 2012-09-24 | Discharge: 2012-09-24 | Disposition: A | Discharge status: Home / Self Care | Payer: 59 | Source: Ambulatory Visit | Attending: Obstetrics and Gynecology | Admitting: Obstetrics and Gynecology

## 2012-09-24 ENCOUNTER — Encounter (HOSPITAL_COMMUNITY): Payer: Self-pay | Admitting: Anesthesiology

## 2012-09-24 ENCOUNTER — Ambulatory Visit (HOSPITAL_COMMUNITY): Payer: 59 | Admitting: Anesthesiology

## 2012-09-24 DIAGNOSIS — N803 Endometriosis of pelvic peritoneum, unspecified: Secondary | ICD-10-CM

## 2012-09-24 DIAGNOSIS — R102 Pelvic and perineal pain: Secondary | ICD-10-CM

## 2012-09-24 DIAGNOSIS — IMO0002 Reserved for concepts with insufficient information to code with codable children: Secondary | ICD-10-CM

## 2012-09-24 DIAGNOSIS — Z86711 Personal history of pulmonary embolism: Secondary | ICD-10-CM | POA: Insufficient documentation

## 2012-09-24 DIAGNOSIS — N949 Unspecified condition associated with female genital organs and menstrual cycle: Secondary | ICD-10-CM | POA: Insufficient documentation

## 2012-09-24 DIAGNOSIS — I428 Other cardiomyopathies: Secondary | ICD-10-CM | POA: Insufficient documentation

## 2012-09-24 DIAGNOSIS — N946 Dysmenorrhea, unspecified: Secondary | ICD-10-CM | POA: Insufficient documentation

## 2012-09-24 HISTORY — PX: LAPAROSCOPY: SHX197

## 2012-09-24 HISTORY — DX: Endometriosis of pelvic peritoneum, unspecified: N80.30

## 2012-09-24 HISTORY — DX: Endometriosis of pelvic peritoneum: N80.3

## 2012-09-24 LAB — PREGNANCY, URINE: Preg Test, Ur: NEGATIVE

## 2012-09-24 SURGERY — LAPAROSCOPY, DIAGNOSTIC
Anesthesia: General | Site: Abdomen | Wound class: Clean Contaminated

## 2012-09-24 MED ORDER — PROPOFOL 10 MG/ML IV BOLUS
INTRAVENOUS | Status: DC | PRN
  Administered 2012-09-24: 50 mg via INTRAVENOUS
  Administered 2012-09-24: 150 mg via INTRAVENOUS

## 2012-09-24 MED ORDER — DEXAMETHASONE SODIUM PHOSPHATE 10 MG/ML IJ SOLN
INTRAMUSCULAR | Status: DC | PRN
  Administered 2012-09-24: 10 mg via INTRAVENOUS

## 2012-09-24 MED ORDER — LACTATED RINGERS IV SOLN: INTRAVENOUS | Status: DC

## 2012-09-24 MED ORDER — PROMETHAZINE HCL 25 MG/ML IJ SOLN
6.2500 mg | INTRAMUSCULAR | Status: DC | PRN
  Administered 2012-09-24: 6.25 mg via INTRAVENOUS

## 2012-09-24 MED ORDER — PROPOFOL 10 MG/ML IV EMUL
INTRAVENOUS | Status: AC
  Filled 2012-09-24: qty 20

## 2012-09-24 MED ORDER — OXYCODONE-ACETAMINOPHEN 5-325 MG PO TABS
ORAL_TABLET | ORAL | Status: AC
  Administered 2012-09-24: 1 via ORAL
  Filled 2012-09-24: qty 1

## 2012-09-24 MED ORDER — NEOSTIGMINE METHYLSULFATE 1 MG/ML IJ SOLN
INTRAMUSCULAR | Status: DC | PRN
  Administered 2012-09-24: 2.5 mg via INTRAVENOUS

## 2012-09-24 MED ORDER — LABETALOL HCL 5 MG/ML IV SOLN
INTRAVENOUS | Status: DC | PRN
  Administered 2012-09-24: 5 mg via INTRAVENOUS

## 2012-09-24 MED ORDER — FENTANYL CITRATE 0.05 MG/ML IJ SOLN
INTRAMUSCULAR | Status: AC
  Administered 2012-09-24: 50 ug via INTRAVENOUS
  Filled 2012-09-24: qty 2

## 2012-09-24 MED ORDER — ROCURONIUM BROMIDE 100 MG/10ML IV SOLN
INTRAVENOUS | Status: DC | PRN
  Administered 2012-09-24: 10 mg via INTRAVENOUS
  Administered 2012-09-24: 40 mg via INTRAVENOUS
  Administered 2012-09-24: 10 mg via INTRAVENOUS

## 2012-09-24 MED ORDER — BUPIVACAINE HCL (PF) 0.25 % IJ SOLN
INTRAMUSCULAR | Status: DC | PRN
  Administered 2012-09-24: 10 mL

## 2012-09-24 MED ORDER — IBUPROFEN 200 MG PO TABS: 800.0000 mg | ORAL_TABLET | Freq: Three times a day (TID) | ORAL | Status: DC | PRN

## 2012-09-24 MED ORDER — ONDANSETRON HCL 4 MG/2ML IJ SOLN
INTRAMUSCULAR | Status: DC | PRN
  Administered 2012-09-24: 4 mg via INTRAVENOUS

## 2012-09-24 MED ORDER — OXYCODONE-ACETAMINOPHEN 5-325 MG PO TABS: 1.0000 | ORAL_TABLET | Freq: Four times a day (QID) | ORAL | Status: DC | PRN

## 2012-09-24 MED ORDER — MEPERIDINE HCL 25 MG/ML IJ SOLN
6.2500 mg | INTRAMUSCULAR | Status: DC | PRN
  Administered 2012-09-24: 12.5 mg via INTRAVENOUS

## 2012-09-24 MED ORDER — GLYCOPYRROLATE 0.2 MG/ML IJ SOLN
INTRAMUSCULAR | Status: AC
  Filled 2012-09-24: qty 3

## 2012-09-24 MED ORDER — BUPIVACAINE HCL (PF) 0.25 % IJ SOLN
INTRAMUSCULAR | Status: AC
  Filled 2012-09-24: qty 30

## 2012-09-24 MED ORDER — GLYCOPYRROLATE 0.2 MG/ML IJ SOLN
INTRAMUSCULAR | Status: DC | PRN
  Administered 2012-09-24: .4 mg via INTRAVENOUS
  Administered 2012-09-24: 0.1 mg via INTRAVENOUS

## 2012-09-24 MED ORDER — LABETALOL HCL 5 MG/ML IV SOLN
INTRAVENOUS | Status: AC
  Filled 2012-09-24: qty 4

## 2012-09-24 MED ORDER — KETOROLAC TROMETHAMINE 30 MG/ML IJ SOLN: 15.0000 mg | Freq: Once | INTRAMUSCULAR | Status: DC | PRN

## 2012-09-24 MED ORDER — MIDAZOLAM HCL 2 MG/2ML IJ SOLN
INTRAMUSCULAR | Status: AC
  Filled 2012-09-24: qty 2

## 2012-09-24 MED ORDER — FENTANYL CITRATE 0.05 MG/ML IJ SOLN
INTRAMUSCULAR | Status: AC
  Filled 2012-09-24: qty 5

## 2012-09-24 MED ORDER — LACTATED RINGERS IV SOLN
INTRAVENOUS | Status: DC
  Administered 2012-09-24 (×2): via INTRAVENOUS

## 2012-09-24 MED ORDER — ONDANSETRON HCL 4 MG/2ML IJ SOLN
INTRAMUSCULAR | Status: AC
  Filled 2012-09-24: qty 2

## 2012-09-24 MED ORDER — FENTANYL CITRATE 0.05 MG/ML IJ SOLN
INTRAMUSCULAR | Status: DC | PRN
  Administered 2012-09-24 (×6): 50 ug via INTRAVENOUS
  Administered 2012-09-24 (×2): 100 ug via INTRAVENOUS

## 2012-09-24 MED ORDER — PROMETHAZINE HCL 25 MG/ML IJ SOLN
INTRAMUSCULAR | Status: AC
  Administered 2012-09-24: 6.25 mg via INTRAVENOUS
  Filled 2012-09-24: qty 1

## 2012-09-24 MED ORDER — LIDOCAINE HCL (CARDIAC) 20 MG/ML IV SOLN
INTRAVENOUS | Status: AC
  Filled 2012-09-24: qty 5

## 2012-09-24 MED ORDER — LIDOCAINE HCL (CARDIAC) 20 MG/ML IV SOLN
INTRAVENOUS | Status: DC | PRN
  Administered 2012-09-24: 60 mg via INTRAVENOUS

## 2012-09-24 MED ORDER — MEPERIDINE HCL 25 MG/ML IJ SOLN
INTRAMUSCULAR | Status: AC
  Administered 2012-09-24: 12.5 mg via INTRAVENOUS
  Filled 2012-09-24: qty 1

## 2012-09-24 MED ORDER — FENTANYL CITRATE 0.05 MG/ML IJ SOLN
25.0000 ug | INTRAMUSCULAR | Status: DC | PRN
  Administered 2012-09-24 (×2): 50 ug via INTRAVENOUS

## 2012-09-24 MED ORDER — ROCURONIUM BROMIDE 50 MG/5ML IV SOLN
INTRAVENOUS | Status: AC
  Filled 2012-09-24: qty 1

## 2012-09-24 MED ORDER — OXYCODONE-ACETAMINOPHEN 5-325 MG PO TABS
1.0000 | ORAL_TABLET | ORAL | Status: DC | PRN
  Administered 2012-09-24: 1 via ORAL

## 2012-09-24 MED ORDER — LACTATED RINGERS IR SOLN
Status: DC | PRN
  Administered 2012-09-24: 3000 mL

## 2012-09-24 MED ORDER — MIDAZOLAM HCL 2 MG/2ML IJ SOLN: 0.5000 mg | Freq: Once | INTRAMUSCULAR | Status: DC | PRN

## 2012-09-24 MED ORDER — NEOSTIGMINE METHYLSULFATE 1 MG/ML IJ SOLN
INTRAMUSCULAR | Status: AC
  Filled 2012-09-24: qty 1

## 2012-09-24 MED ORDER — DEXAMETHASONE SODIUM PHOSPHATE 10 MG/ML IJ SOLN
INTRAMUSCULAR | Status: AC
  Filled 2012-09-24: qty 1

## 2012-09-24 MED ORDER — MIDAZOLAM HCL 2 MG/2ML IJ SOLN
INTRAMUSCULAR | Status: DC | PRN
  Administered 2012-09-24: 2 mg via INTRAVENOUS

## 2012-09-24 SURGICAL SUPPLY — 26 items
ADH SKN CLS APL DERMABOND .7 (GAUZE/BANDAGES/DRESSINGS) ×1
BLADE SURG 11 STRL SS (BLADE) ×2 IMPLANT
CABLE HIGH FREQUENCY MONO STRZ (ELECTRODE) IMPLANT
CATH ROBINSON RED A/P 16FR (CATHETERS) ×2 IMPLANT
CHLORAPREP W/TINT 26ML (MISCELLANEOUS) ×2 IMPLANT
CLOTH BEACON ORANGE TIMEOUT ST (SAFETY) ×2 IMPLANT
DERMABOND ADVANCED (GAUZE/BANDAGES/DRESSINGS) ×1
DERMABOND ADVANCED .7 DNX12 (GAUZE/BANDAGES/DRESSINGS) ×1 IMPLANT
FILTER SMOKE EVAC LAPAROSHD (FILTER) ×1 IMPLANT
GLOVE BIO SURGEON STRL SZ 6.5 (GLOVE) ×2 IMPLANT
GLOVE BIO SURGEON STRL SZ7 (GLOVE) ×2 IMPLANT
GOWN PREVENTION PLUS LG XLONG (DISPOSABLE) ×6 IMPLANT
NEEDLE INSUFFLATION 120MM (ENDOMECHANICALS) ×1 IMPLANT
NEEDLE INSUFFLATION 150MM (ENDOMECHANICALS) ×1 IMPLANT
NS IRRIG 1000ML POUR BTL (IV SOLUTION) ×2 IMPLANT
PACK LAPAROSCOPY BASIN (CUSTOM PROCEDURE TRAY) ×2 IMPLANT
PROTECTOR NERVE ULNAR (MISCELLANEOUS) ×2 IMPLANT
SCALPEL HARMONIC ACE (MISCELLANEOUS) ×1 IMPLANT
SET IRRIG TUBING LAPAROSCOPIC (IRRIGATION / IRRIGATOR) ×1 IMPLANT
SUT VICRYL 0 UR6 27IN ABS (SUTURE) IMPLANT
SUT VICRYL 4-0 PS2 18IN ABS (SUTURE) ×2 IMPLANT
TOWEL OR 17X24 6PK STRL BLUE (TOWEL DISPOSABLE) ×4 IMPLANT
TROCAR XCEL NON-BLD 11X100MML (ENDOMECHANICALS) IMPLANT
TROCAR XCEL NON-BLD 5MMX100MML (ENDOMECHANICALS) ×5 IMPLANT
WARMER LAPAROSCOPE (MISCELLANEOUS) ×2 IMPLANT
WATER STERILE IRR 1000ML POUR (IV SOLUTION) ×2 IMPLANT

## 2012-09-24 NOTE — Transfer of Care (Signed)
Immediate Anesthesia Transfer of Care Note  Patient: Nicole Hughes  Procedure(s) Performed: Procedure(s): OPERATIVE LAPROSCOPY WITH LYSIS OF ADHESIONS (N/A)  Patient Location: PACU  Anesthesia Type:General  Level of Consciousness: awake, alert  and oriented  Airway & Oxygen Therapy: Patient Spontanous Breathing and Patient connected to nasal cannula oxygen  Post-op Assessment: Report given to PACU RN and Post -op Vital signs reviewed and stable  Post vital signs: Reviewed and stable  Complications: No apparent anesthesia complications

## 2012-09-24 NOTE — Anesthesia Preprocedure Evaluation (Addendum)
Anesthesia Evaluation  Patient identified by MRN, date of birth, ID band Patient awake    Reviewed: Allergy & Precautions, H&P , Patient's Chart, lab work & pertinent test results, reviewed documented beta blocker date and time   History of Anesthesia Complications Negative for: history of anesthetic complications  Airway Mallampati: II TM Distance: >3 FB Neck ROM: full    Dental no notable dental hx.    Pulmonary neg pulmonary ROS,  breath sounds clear to auscultation  Pulmonary exam normal       Cardiovascular Exercise Tolerance: Good hypertension, negative cardio ROS  Rhythm:regular Rate:Normal     Neuro/Psych PSYCHIATRIC DISORDERS negative neurological ROS  negative psych ROS   GI/Hepatic negative GI ROS, Neg liver ROS,   Endo/Other  negative endocrine ROS  Renal/GU negative Renal ROS     Musculoskeletal   Abdominal   Peds  Hematology negative hematology ROS (+)   Anesthesia Other Findings Complication of anesthesia   "hard to get under"  Pulmonary embolism   Pelvic pain     Pulmonary embolism        Cardiomyopathy    Reproductive/Obstetrics negative OB ROS                          Anesthesia Physical Anesthesia Plan  ASA: II  Anesthesia Plan: General ETT   Post-op Pain Management:    Induction:   Airway Management Planned:   Additional Equipment:   Intra-op Plan:   Post-operative Plan:   Informed Consent: I have reviewed the patients History and Physical, chart, labs and discussed the procedure including the risks, benefits and alternatives for the proposed anesthesia with the patient or authorized representative who has indicated his/her understanding and acceptance.   Dental Advisory Given  Plan Discussed with: CRNA and Surgeon  Anesthesia Plan Comments:        Anesthesia Quick Evaluation

## 2012-09-24 NOTE — Brief Op Note (Signed)
09/24/2012  10:40 AM  PATIENT:  Nicole Hughes  42 y.o. female  PRE-OPERATIVE DIAGNOSIS:  Pelvic Pain, [id number]  POST-OPERATIVE DIAGNOSIS:  pelvic pain, adhesion, endometriosis  PROCEDURE:  Procedure(s): OPERATIVE LAPROSCOPY WITH LYSIS OF ADHESIONS (N/A), fulguration of endometriosis  SURGEON:  Surgeon(s) and Role:    * Sherron Monday, MD - Primary   ANESTHESIA:   local and general  EBL:  Total I/O In: 1700 [I.V.:1700] Out: 100 [Urine:100]EBL min < 50cc  BLOOD ADMINISTERED:none  DRAINS: none   LOCAL MEDICATIONS USED:  MARCAINE     SPECIMEN:  No Specimen  DISPOSITION OF SPECIMEN:  N/A  COUNTS:  YES  TOURNIQUET:  * No tourniquets in log *  DICTATION: .Other Dictation: Dictation Number [id number]  PLAN OF CARE: Discharge to home after PACU  PATIENT DISPOSITION:  PACU - hemodynamically stable.   Delay start of Pharmacological VTE agent (>24hrs) due to surgical blood loss or risk of bleeding: not applicable

## 2012-09-24 NOTE — Anesthesia Procedure Notes (Signed)
Procedure Name: Intubation Date/Time: 09/24/2012 8:55 AM Performed by: Graciela Husbands Pre-anesthesia Checklist: Patient identified, Emergency Drugs available, Suction available, Patient being monitored and Timeout performed Patient Re-evaluated:Patient Re-evaluated prior to inductionOxygen Delivery Method: Circle system utilized Preoxygenation: Pre-oxygenation with 100% oxygen Intubation Type: IV induction Ventilation: Mask ventilation without difficulty and Oral airway inserted - appropriate to patient size Laryngoscope Size: Mac and 3 Grade View: Grade II Tube type: Oral Tube size: 7.0 mm Number of attempts: 1 Airway Equipment and Method: Stylet Placement Confirmation: ETT inserted through vocal cords under direct vision,  positive ETCO2,  CO2 detector and breath sounds checked- equal and bilateral Secured at: 21 cm Tube secured with: Tape Dental Injury: Teeth and Oropharynx as per pre-operative assessment

## 2012-09-24 NOTE — Interval H&P Note (Signed)
History and Physical Interval Note:  09/24/2012 8:31 AM  Nicole Hughes  has presented today for surgery, with the diagnosis of Pelvic Pain, 40981  The various methods of treatment have been discussed with the patient and family. After consideration of risks, benefits and other options for treatment, the patient has consented to  Procedure(s): LAPAROSCOPY DIAGNOSTIC (N/A) as a surgical intervention .  The patient's history has been reviewed, patient examined, no change in status, stable for surgery.  I have reviewed the patient's chart and labs.  Questions were answered to the patient's satisfaction.     [name],[name]

## 2012-09-24 NOTE — Anesthesia Postprocedure Evaluation (Signed)
  Anesthesia Post Note  Patient: Nicole Hughes  Procedure(s) Performed: Procedure(s) (LRB): OPERATIVE LAPROSCOPY WITH LYSIS OF ADHESIONS (N/A)  Anesthesia type: GA  Patient location: PACU  Post pain: Pain level controlled  Post assessment: Post-op Vital signs reviewed  Last Vitals:  Filed Vitals:   09/24/12 0759  BP: 158/96  Pulse: 83  Temp: 36.7 C  Resp: 20    Post vital signs: Reviewed  Level of consciousness: sedated  Complications: No apparent anesthesia complications

## 2012-09-25 NOTE — Op Note (Signed)
Nicole Hughes, Nicole Hughes              ACCOUNT NO.:  [id number]  MEDICAL RECORD NO.:  [id number]  LOCATION:  [facility]                          FACILITY:  [facility]  PHYSICIAN:  Sherron Monday, MD        DATE OF BIRTH:  Jul 26, 1970  DATE OF PROCEDURE:  09/24/2012 DATE OF DISCHARGE:  09/24/2012                              OPERATIVE REPORT   PREOPERATIVE DIAGNOSIS:  Pelvic pain.  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  PROCEDURES:  Operative laparoscopy with lysis of adhesions and fulguration of endometriosis.  SURGEON:  Sherron Monday, MD  ANESTHESIA:  Local with 0.2% Marcaine and general anesthesia.  IV FLUIDS:  1700 mL.  URINE OUTPUT:  100 mL clear urine prior to the procedure.  ESTIMATED BLOOD LOSS:  Minimal, less than 50 mL.  COMPLICATIONS:  None.  PATHOLOGY:  None.  DESCRIPTION OF PROCEDURE:  After informed consent was reviewed with the patient and her husband, she was transported to the OR, placed on the table in supine position.  General anesthesia was induced and found to be adequate.  She was then placed in the Yellofin stirrups.  Prepped and draped in the normal sterile fashion.  Her bladder was sterilely drained with a red Robin catheter.  Using an open-sided speculum, a Hulka tenaculum was placed on her cervix.  Attention was then turned to performing the surgery.  A vertical infraumbilical incision was made and attempt was made to enter the cavity with a Veress needle.  This was initially unsuccessful.  It was thought that the Veress might be blocked like with clot.  It was flushed and this was attempted on multiple occasions.  The incision was extended minimally and the fascia was cleared off and grasped with Kocher clamps, elevated, and again the Veress was attempted to be used.  As it is unsuccessful, the laparoscopic entry was used.  This was successful with the placement of the  trocar.  A large omental to anterior abdominal wall adhesion was noted.  The pelvic survey performed revealed endometriosis on the uterosacral ligaments, right greater than left, and minimal endometriosis on the midline underside of the uterus itself.  There was also noted to be minimal old blood in the pelvic cavity.  Using a suction irrigator, this was removed.  The endometriosis was fulgurated using a Kleppinger under direct visualization.  The adhesion was grasped with an atraumatic grasper and using a Harmonic scalpel, was excised, noted to be hemostatic.  A large portion was left against the anterior abdominal wall.  There was an angle that was unable to the ligated without placing another port.  Sponge, lap, and needle counts were correct x2.  The ports were removed under direct visualization.  The patient tolerated the procedure well.  The abdomen was evacuated of all gas.  Sponge, lap, and needle counts were correct x2, and plan of care will be discussed with the patient in the next week as she has to travel to the [place] to be with her father who is getting a heart surgery.     Sherron Monday, MD     JB/MEDQ  D:  09/24/2012  T:  09/25/2012  Job:  [id number]

## 2012-09-27 ENCOUNTER — Encounter (HOSPITAL_COMMUNITY): Payer: Self-pay | Admitting: Obstetrics and Gynecology

## 2012-12-20 ENCOUNTER — Ambulatory Visit (INDEPENDENT_AMBULATORY_CARE_PROVIDER_SITE_OTHER)
Admission: RE | Admit: 2012-12-20 | Discharge: 2012-12-20 | Disposition: A | Discharge status: Home / Self Care | Payer: 59 | Source: Ambulatory Visit | Attending: Internal Medicine | Admitting: Internal Medicine

## 2012-12-20 ENCOUNTER — Encounter: Payer: Self-pay | Admitting: Internal Medicine

## 2012-12-20 ENCOUNTER — Ambulatory Visit (INDEPENDENT_AMBULATORY_CARE_PROVIDER_SITE_OTHER): Payer: 59 | Admitting: Internal Medicine

## 2012-12-20 ENCOUNTER — Telehealth: Payer: Self-pay

## 2012-12-20 VITALS — BP 168/102 | HR 85 | Temp 98.3°F | Ht 65.75 in | Wt 155.8 lb

## 2012-12-20 DIAGNOSIS — M545 Low back pain, unspecified: Secondary | ICD-10-CM

## 2012-12-20 DIAGNOSIS — R112 Nausea with vomiting, unspecified: Secondary | ICD-10-CM

## 2012-12-20 DIAGNOSIS — R1011 Right upper quadrant pain: Secondary | ICD-10-CM

## 2012-12-20 LAB — POCT URINALYSIS DIPSTICK
Glucose, UA: NEGATIVE
Ketones, UA: NEGATIVE
Leukocytes, UA: NEGATIVE
Nitrite, UA: NEGATIVE
Spec Grav, UA: 1.015
Urobilinogen, UA: 0.2
pH, UA: 6

## 2012-12-20 MED ORDER — PROMETHAZINE HCL 25 MG PO TABS: 25.0000 mg | ORAL_TABLET | Freq: Three times a day (TID) | ORAL | Status: DC | PRN

## 2012-12-20 NOTE — Progress Notes (Signed)
Subjective:    Patient ID: Nicole Hughes, female    DOB: [birth year], 42 y.o.   MRN: [id number]  HPI  Pt presents to the clinic today with multiple complaints. 1- she has had pain and difficulty swallowing x 1 month . She describes it as a knot in her chest. She feels this constantly when she tries to eat and drink. She has taken zantac and gas x without much relief. She is having chest pain and chest tightness. She denies shortness of breath. She has had nausea and vomiting. 2-  She c/o back pain. This occurs at the same time as the pain in her chest. Nothing relieves the pain in her back. The pain is not worse with movement.  Review of Systems      Past Medical History  Diagnosis Date  . Dysmenorrhea   . Dyspareunia   . Pelvic pain   . Pulmonary embolism   . Cardiomyopathy   . Cyst of ovary   . History of chicken pox   . History of depression   . History of migraine     and frequent Headaches  . History of hay fever   . History of blood clots   . Complication of anesthesia     "hard to get under"  . 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/24/2012    Current Outpatient Prescriptions  Medication Sig Dispense Refill  . amLODipine (NORVASC) 5 MG tablet Take 1 tablet (5 mg total) by mouth daily.  30 tablet  3  . busPIRone (BUSPAR) 15 MG tablet Take 0.5 tablets (7.5 mg total) by mouth 2 (two) times daily.  30 tablet  5  . diphenhydramine-acetaminophen (TYLENOL PM) 25-500 MG TABS Take 2 tablets by mouth at bedtime as needed (for sleep).      . hydrochlorothiazide (HYDRODIURIL) 25 MG tablet Take 1 tablet (25 mg total) by mouth daily.  30 tablet  5  . ibuprofen (ADVIL,MOTRIN) 200 MG tablet Take 4 tablets (800 mg total) by mouth every 8 (eight) hours as needed for pain.  45 tablet  1  . lisinopril (PRINIVIL,ZESTRIL) 10 MG tablet Take 1 tablet (10 mg total) by mouth daily.  30 tablet  5  . medroxyPROGESTERone (DEPO-PROVERA) 150 MG/ML injection       . oxyCODONE-acetaminophen  (ROXICET) 5-325 MG per tablet Take 1-2 tablets by mouth every 6 (six) hours as needed for pain.  30 tablet  0   No current facility-administered medications for this visit.    No Known Allergies  Family History  Problem Relation Age of Onset  . Cancer - Lung Mother   . Heart failure Father   . Hypertension Father     History   Social History  . Marital Status: Married    Spouse Name: N/A    Number of Children: N/A  . Years of Education: N/A   Occupational History  . Not on file.   Social History Main Topics  . Smoking status: Never Smoker   . Smokeless tobacco: Not on file  . Alcohol Use: Yes     Comment: occ  . Drug Use: No  . Sexual Activity: Not on file   Other Topics Concern  . Not on file   Social History Narrative  . No narrative on file     Constitutional: Denies fever, malaise, fatigue, headache or abrupt weight changes.  Respiratory: Denies difficulty breathing, shortness of breath, cough or sputum production.   Cardiovascular: Denies chest pain, , palpitations or swelling  in the hands or feet.  Gastrointestinal: Denies abdominal pain, bloating, constipation, diarrhea or blood in the stool.  GU: Denies urgency, frequency, pain with urination, burning sensation, blood in urine, odor or discharge. Musculoskeletal: Denies decrease in range of motion, difficulty with gait, or joint pain and swelling.  Neurological: Denies dizziness, difficulty with memory, difficulty with speech or problems with balance and coordination.   No other specific complaints in a complete review of systems (except as listed in HPI above).  Objective:   Physical Exam   BP 168/102  Pulse 85  Temp(Src) 98.3 F (36.8 C) (Oral)  Ht 5' 5.75" (1.67 m)  Wt 155 lb 12 oz (70.648 kg)  BMI 25.33 kg/m2  SpO2 98%  LMP 11/26/2012 Wt Readings from Last 3 Encounters:  12/20/12 155 lb 12 oz (70.648 kg)  09/21/12 140 lb (63.504 kg)  08/20/12 150 lb (68.04 kg)    General: Appears her  stated age, well developed, well nourished in NAD. Neck: Normal range of motion. Neck supple, trachea midline. No massses, lumps or thyromegaly present.  Cardiovascular: Normal rate and rhythm. S1,S2 noted.  No murmur, rubs or gallops noted. No JVD or BLE edema. No carotid bruits noted. Pulmonary/Chest: Normal effort and positive vesicular breath sounds. No respiratory distress. No wheezes, rales or ronchi noted.  Abdomen: Soft very tender in RUQ + Murpheys sign. Normal bowel sounds, no bruits noted. No distention or masses noted. Liver, spleen and kidneys non palpable. Musculoskeletal: Normal range of motion. No signs of joint swelling. No difficulty with gait.  Neurological: Alert and oriented. Cranial nerves II-XII intact. Coordination normal. +DTRs bilaterally.   BMET    Component Value Date/Time   NA 138 09/21/2012 1600   K 3.5 09/21/2012 1600   CL 100 09/21/2012 1600   CO2 25 09/21/2012 1600   GLUCOSE 93 09/21/2012 1600   BUN 7 09/21/2012 1600   CREATININE 0.62 09/21/2012 1600   CALCIUM 9.9 09/21/2012 1600   GFRNONAA >90 09/21/2012 1600   GFRAA >90 09/21/2012 1600    Lipid Panel     Component Value Date/Time   CHOL 176 06/09/2012 1710   TRIG 199.0* 06/09/2012 1710   HDL 51.80 06/09/2012 1710   CHOLHDL 3 06/09/2012 1710   VLDL 39.8 06/09/2012 1710   LDLCALC 84 06/09/2012 1710    CBC    Component Value Date/Time   WBC 6.5 09/21/2012 1600   RBC 4.37 09/21/2012 1600   HGB 14.0 09/21/2012 1600   HCT 39.6 09/21/2012 1600   PLT 211 09/21/2012 1600   MCV 90.6 09/21/2012 1600   MCH 32.0 09/21/2012 1600   MCHC 35.4 09/21/2012 1600   RDW 12.2 09/21/2012 1600   LYMPHSABS 2.1 06/09/2012 1710   MONOABS 0.4 06/09/2012 1710   EOSABS 0.1 06/09/2012 1710   BASOSABS 0.0 06/09/2012 1710    Hgb A1C No results found for this basename: HGBA1C        Assessment & Plan:   RUQ pain, nausea, vomiting and back pain, new onset:  ? Liver versus gallbladder Will check chest xray ( as she c/o chest tightness although  I don't think this is cardiac related) Labs- CMET, CBC, amylase, lipase eRx for phenergan for nausea No pain medication with tylenol- ibuprofen as needed Will obtain ultrasound of abdomen  Will follow up as soon as I get labs/imaging back- If any worse, go to the nearest ER.

## 2012-12-20 NOTE — Telephone Encounter (Signed)
Sent, thanks

## 2012-12-20 NOTE — Telephone Encounter (Signed)
Pt left v/m that pt saw Nicki Reaper NP today and phenergan was not sent to [facility]. Under assessment and plan of todays note it was mentioned about phenergan being prescribed but do not see where sent to pharmacy.Nicki Reaper has already gone.Please advise.

## 2012-12-20 NOTE — Patient Instructions (Signed)
Abdominal Pain, Women °Abdominal (stomach, pelvic, or belly) pain can be caused by many things. It is important to tell your doctor: °· The location of the pain. °· Does it come and go or is it present all the time? °· Are there things that start the pain (eating certain foods, exercise)? °· Are there other symptoms associated with the pain (fever, nausea, vomiting, diarrhea)? °All of this is helpful to know when trying to find the cause of the pain. °CAUSES  °· Stomach: virus or bacteria infection, or ulcer. °· Intestine: appendicitis (inflamed appendix), regional ileitis (Crohn's disease), ulcerative colitis (inflamed colon), irritable bowel syndrome, diverticulitis (inflamed diverticulum of the colon), or cancer of the stomach or intestine. °· Gallbladder disease or stones in the gallbladder. °· Kidney disease, kidney stones, or infection. °· Pancreas infection or cancer. °· Fibromyalgia (pain disorder). °· Diseases of the female organs: °· Uterus: fibroid (non-cancerous) tumors or infection. °· Fallopian tubes: infection or tubal pregnancy. °· Ovary: cysts or tumors. °· Pelvic adhesions (scar tissue). °· Endometriosis (uterus lining tissue growing in the pelvis and on the pelvic organs). °· Pelvic congestion syndrome (female organs filling up with blood just before the menstrual period). °· Pain with the menstrual period. °· Pain with ovulation (producing an egg). °· Pain with an IUD (intrauterine device, birth control) in the uterus. °· Cancer of the female organs. °· Functional pain (pain not caused by a disease, may improve without treatment). °· Psychological pain. °· Depression. °DIAGNOSIS  °Your doctor will decide the seriousness of your pain by doing an examination. °· Blood tests. °· X-rays. °· Ultrasound. °· CT scan (computed tomography, special type of X-ray). °· MRI (magnetic resonance imaging). °· Cultures, for infection. °· Barium enema (dye inserted in the large intestine, to better view it with  X-rays). °· Colonoscopy (looking in intestine with a lighted tube). °· Laparoscopy (minor surgery, looking in abdomen with a lighted tube). °· Major abdominal exploratory surgery (looking in abdomen with a large incision). °TREATMENT  °The treatment will depend on the cause of the pain.  °· Many cases can be observed and treated at home. °· Over-the-counter medicines recommended by your caregiver. °· Prescription medicine. °· Antibiotics, for infection. °· Birth control pills, for painful periods or for ovulation pain. °· Hormone treatment, for endometriosis. °· Nerve blocking injections. °· Physical therapy. °· Antidepressants. °· Counseling with a psychologist or psychiatrist. °· Minor or major surgery. °HOME CARE INSTRUCTIONS  °· Do not take laxatives, unless directed by your caregiver. °· Take over-the-counter pain medicine only if ordered by your caregiver. Do not take aspirin because it can cause an upset stomach or bleeding. °· Try a clear liquid diet (broth or water) as ordered by your caregiver. Slowly move to a bland diet, as tolerated, if the pain is related to the stomach or intestine. °· Have a thermometer and take your temperature several times a day, and record it. °· Bed rest and sleep, if it helps the pain. °· Avoid sexual intercourse, if it causes pain. °· Avoid stressful situations. °· Keep your follow-up appointments and tests, as your caregiver orders. °· If the pain does not go away with medicine or surgery, you may try: °· Acupuncture. °· Relaxation exercises (yoga, meditation). °· Group therapy. °· Counseling. °SEEK MEDICAL CARE IF:  °· You notice certain foods cause stomach pain. °· Your home care treatment is not helping your pain. °· You need stronger pain medicine. °· You want your IUD removed. °· You feel faint or   lightheaded. °· You develop nausea and vomiting. °· You develop a rash. °· You are having side effects or an allergy to your medicine. °SEEK IMMEDIATE MEDICAL CARE IF:  °· Your  pain does not go away or gets worse. °· You have a fever. °· Your pain is felt only in portions of the abdomen. The right side could possibly be appendicitis. The left lower portion of the abdomen could be colitis or diverticulitis. °· You are passing blood in your stools (bright red or black tarry stools, with or without vomiting). °· You have blood in your urine. °· You develop chills, with or without a fever. °· You pass out. °MAKE SURE YOU:  °· Understand these instructions. °· Will watch your condition. °· Will get help right away if you are not doing well or get worse. °Document Released: 10/20/2006 Document Revised: 03/17/2011 Document Reviewed: 11/09/2008 °ExitCare® Patient Information ©2014 [employer]. ° °

## 2012-12-20 NOTE — Telephone Encounter (Signed)
Left voicemail letting pt know Rx sent to pharmacy  

## 2012-12-20 NOTE — Progress Notes (Signed)
Pre-visit discussion using our clinic review tool. No additional management support is needed unless otherwise documented below in the visit note.  

## 2012-12-21 LAB — LIPASE: Lipase: 150 U/L — ABNORMAL HIGH (ref 11.0–59.0)

## 2012-12-21 LAB — COMPREHENSIVE METABOLIC PANEL
ALT: 26 U/L (ref 0–35)
AST: 27 U/L (ref 0–37)
Albumin: 4.4 g/dL (ref 3.5–5.2)
Alkaline Phosphatase: 26 U/L — ABNORMAL LOW (ref 39–117)
BUN: 11 mg/dL (ref 6–23)
CO2: 25 mEq/L (ref 19–32)
Calcium: 9.1 mg/dL (ref 8.4–10.5)
Chloride: 106 mEq/L (ref 96–112)
Creatinine, Ser: 0.7 mg/dL (ref 0.4–1.2)
GFR: 98.15 mL/min (ref >60.00)
Glucose, Bld: 79 mg/dL (ref 70–99)
Potassium: 4.3 mEq/L (ref 3.5–5.1)
Sodium: 137 mEq/L (ref 135–145)
Total Bilirubin: 0.6 mg/dL (ref 0.3–1.2)
Total Protein: 6.6 g/dL (ref 6.0–8.3)

## 2012-12-21 LAB — CBC
HCT: 39.5 % (ref 36.0–46.0)
Hemoglobin: 13.5 g/dL (ref 12.0–15.0)
MCHC: 34 g/dL (ref 30.0–36.0)
MCV: 93 fl (ref 78.0–100.0)
Platelets: 235 10*3/uL (ref 150.0–400.0)
RBC: 4.25 Mil/uL (ref 3.87–5.11)
RDW: 12.1 % (ref 11.5–14.6)
WBC: 7.7 10*3/uL (ref 4.5–10.5)

## 2012-12-21 LAB — AMYLASE: Amylase: 142 U/L — ABNORMAL HIGH (ref 27–131)

## 2012-12-22 ENCOUNTER — Ambulatory Visit
Admission: RE | Admit: 2012-12-22 | Discharge: 2012-12-22 | Disposition: A | Discharge status: Home / Self Care | Payer: 59 | Source: Ambulatory Visit | Attending: Internal Medicine | Admitting: Internal Medicine

## 2012-12-22 DIAGNOSIS — R1011 Right upper quadrant pain: Secondary | ICD-10-CM

## 2012-12-23 ENCOUNTER — Telehealth: Payer: Self-pay

## 2012-12-23 NOTE — Telephone Encounter (Signed)
Pt left v/m requesting cb at [phone] about results. Unable to go into result note to leave message.

## 2012-12-24 NOTE — Telephone Encounter (Signed)
Pt is aware of CXR, [place], and lab results. Pt states she is not feeling better and made an appt to come in on 12/22

## 2012-12-27 ENCOUNTER — Ambulatory Visit (INDEPENDENT_AMBULATORY_CARE_PROVIDER_SITE_OTHER): Payer: 59 | Admitting: Internal Medicine

## 2012-12-27 VITALS — BP 136/90 | HR 81 | Temp 98.7°F | Wt 155.5 lb

## 2012-12-27 DIAGNOSIS — R112 Nausea with vomiting, unspecified: Secondary | ICD-10-CM

## 2012-12-27 DIAGNOSIS — R109 Unspecified abdominal pain: Secondary | ICD-10-CM

## 2012-12-27 DIAGNOSIS — K859 Acute pancreatitis without necrosis or infection, unspecified: Secondary | ICD-10-CM

## 2012-12-27 MED ORDER — OXYCODONE-ACETAMINOPHEN 5-325 MG PO TABS: 1.0000 | ORAL_TABLET | Freq: Four times a day (QID) | ORAL | Status: DC | PRN

## 2012-12-27 MED ORDER — ONDANSETRON HCL 4 MG PO TABS: 4.0000 mg | ORAL_TABLET | Freq: Three times a day (TID) | ORAL | Status: DC | PRN

## 2012-12-27 NOTE — Progress Notes (Signed)
Pre-visit discussion using our clinic review tool. No additional management support is needed unless otherwise documented below in the visit note.  

## 2012-12-27 NOTE — Patient Instructions (Signed)
Acute Pancreatitis °Acute pancreatitis is a disease in which the pancreas becomes suddenly inflamed. The pancreas is a large gland located behind your stomach. The pancreas produces enzymes that help digest food. The pancreas also releases the hormones glucagon and insulin that help regulate blood sugar. Damage to the pancreas occurs when the digestive enzymes from the pancreas are activated and begin attacking the pancreas before being released into the intestine. Most acute attacks last a couple of days and can cause serious complications. Some people become dehydrated and develop low blood pressure. In severe cases, bleeding into the pancreas can lead to shock and can be life-threatening. The lungs, heart, and kidneys may fail. °CAUSES  °Pancreatitis can happen to anyone. In some cases, the cause is unknown. Most cases are caused by: °· Alcohol abuse. °· Gallstones. °Other less common causes are: °· Certain medicines. °· Exposure to certain chemicals. °· Infection. °· Damage caused by an accident (trauma). °· Abdominal surgery. °SYMPTOMS  °· Pain in the upper abdomen that may radiate to the back. °· Tenderness and swelling of the abdomen. °· Nausea and vomiting. °DIAGNOSIS  °Your caregiver will perform a physical exam. Blood and stool tests may be done to confirm the diagnosis. Imaging tests may also be done, such as X-rays, CT scans, or an ultrasound of the abdomen. °TREATMENT  °Treatment usually requires a stay in the hospital. Treatment may include: °· Pain medicine. °· Fluid replacement through an intravenous line (IV). °· Placing a tube in the stomach to remove stomach contents and control vomiting. °· Not eating for 3 or 4 days. This gives your pancreas a rest, because enzymes are not being produced that can cause further damage. °· Antibiotic medicines if your condition is caused by an infection. °· Surgery of the pancreas or gallbladder. °HOME CARE INSTRUCTIONS  °· Follow the diet advised by your  caregiver. This may involve avoiding alcohol and decreasing the amount of fat in your diet. °· Eat smaller, more frequent meals. This reduces the amount of digestive juices the pancreas produces. °· Drink enough fluids to keep your urine clear or pale yellow. °· Only take over-the-counter or prescription medicines as directed by your caregiver. °· Avoid drinking alcohol if it caused your condition. °· Do not smoke. °· Get plenty of rest. °· Check your blood sugar at home as directed by your caregiver. °· Keep all follow-up appointments as directed by your caregiver. °SEEK MEDICAL CARE IF:  °· You do not recover as quickly as expected. °· You develop new or worsening symptoms. °· You have persistent pain, weakness, or nausea. °· You recover and then have another episode of pain. °SEEK IMMEDIATE MEDICAL CARE IF:  °· You are unable to eat or keep fluids down. °· Your pain becomes severe. °· You have a fever or persistent symptoms for more than 2 to 3 days. °· You have a fever and your symptoms suddenly get worse. °· Your skin or the white part of your eyes turn yellow (jaundice). °· You develop vomiting. °· You feel dizzy, or you faint. °· Your blood sugar is high (over 300 mg/dL). °MAKE SURE YOU:  °· Understand these instructions. °· Will watch your condition. °· Will get help right away if you are not doing well or get worse. °Document Released: 12/23/2004 Document Revised: 06/24/2011 Document Reviewed: 04/03/2011 °ExitCare® Patient Information ©2014 [employer]. ° °

## 2012-12-27 NOTE — Progress Notes (Signed)
Subjective:    Patient ID: Nicole Hughes, female    DOB: Sep 20, 1970, 42 y.o.   MRN: [id number]  HPI  Pt presents to the clinic today with continued abdominal pain, back pain. She was seen 12/15 for the same. Amylase and Lipase were elevated. Ultrasound of the abdomen is normal. She was given phenergan for nausea at that time. She reports that it has not worked. When I asked her why did she not go to the ER as advised, she reports that she does not have the money. She does reports that she has gotten worse. The pain will not go away. She can't keep anything down.  Review of Systems      Past Medical History  Diagnosis Date  . Dysmenorrhea   . Dyspareunia   . Pelvic pain   . Pulmonary embolism   . Cardiomyopathy   . Cyst of ovary   . History of chicken pox   . History of depression   . History of migraine     and frequent Headaches  . History of hay fever   . History of blood clots   . Complication of anesthesia     "hard to get under"  . 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/24/2012    Current Outpatient Prescriptions  Medication Sig Dispense Refill  . amLODipine (NORVASC) 5 MG tablet Take 1 tablet (5 mg total) by mouth daily.  30 tablet  3  . busPIRone (BUSPAR) 15 MG tablet Take 0.5 tablets (7.5 mg total) by mouth 2 (two) times daily.  30 tablet  5  . diphenhydramine-acetaminophen (TYLENOL PM) 25-500 MG TABS Take 2 tablets by mouth at bedtime as needed (for sleep).      . hydrochlorothiazide (HYDRODIURIL) 25 MG tablet Take 1 tablet (25 mg total) by mouth daily.  30 tablet  5  . ibuprofen (ADVIL,MOTRIN) 200 MG tablet Take 4 tablets (800 mg total) by mouth every 8 (eight) hours as needed for pain.  45 tablet  1  . lisinopril (PRINIVIL,ZESTRIL) 10 MG tablet Take 1 tablet (10 mg total) by mouth daily.  30 tablet  5  . medroxyPROGESTERone (DEPO-PROVERA) 150 MG/ML injection       . oxyCODONE-acetaminophen (ROXICET) 5-325 MG per tablet Take 1-2 tablets by mouth every 6 (six)  hours as needed for pain.  30 tablet  0  . promethazine (PHENERGAN) 25 MG tablet Take 1 tablet (25 mg total) by mouth every 8 (eight) hours as needed for nausea or vomiting.  20 tablet  0   No current facility-administered medications for this visit.    No Known Allergies  Family History  Problem Relation Age of Onset  . Cancer - Lung Mother   . Heart failure Father   . Hypertension Father     History   Social History  . Marital Status: Married    Spouse Name: N/A    Number of Children: N/A  . Years of Education: N/A   Occupational History  . Not on file.   Social History Main Topics  . Smoking status: Never Smoker   . Smokeless tobacco: Not on file  . Alcohol Use: Yes     Comment: occ  . Drug Use: No  . Sexual Activity: Not on file   Other Topics Concern  . Not on file   Social History Narrative  . No narrative on file     Constitutional: Denies fever, malaise, fatigue, headache or abrupt weight changes.  Respiratory: Denies difficulty breathing, shortness  of breath, cough or sputum production.   Cardiovascular: Denies chest pain, chest tightness, palpitations or swelling in the hands or feet.  Gastrointestinal: Denies  bloating, constipation, diarrhea or blood in the stool.    No other specific complaints in a complete review of systems (except as listed in HPI above).  Objective:   Physical Exam   BP 136/90  Pulse 81  Temp(Src) 98.7 F (37.1 C) (Oral)  Wt 155 lb 8 oz (70.534 kg)  SpO2 99%  LMP 11/26/2012 Wt Readings from Last 3 Encounters:  12/27/12 155 lb 8 oz (70.534 kg)  12/20/12 155 lb 12 oz (70.648 kg)  09/21/12 140 lb (63.504 kg)    General: Appears her stated age, well developed, well nourished in NAD. Cardiovascular: Normal rate and rhythm. S1,S2 noted.  No murmur, rubs or gallops noted. No JVD or BLE edema. No carotid bruits noted. Pulmonary/Chest: Normal effort and positive vesicular breath sounds. No respiratory distress. No wheezes,  rales or ronchi noted.  Abdomen: Soft tender in the epigastric region. Normal bowel sounds, no bruits noted. No distention or masses noted. Liver, spleen and kidneys non palpable.   BMET    Component Value Date/Time   NA 137 12/20/2012 1600   K 4.3 12/20/2012 1600   CL 106 12/20/2012 1600   CO2 25 12/20/2012 1600   GLUCOSE 79 12/20/2012 1600   BUN 11 12/20/2012 1600   CREATININE 0.7 12/20/2012 1600   CALCIUM 9.1 12/20/2012 1600   GFRNONAA >90 09/21/2012 1600   GFRAA >90 09/21/2012 1600    Lipid Panel     Component Value Date/Time   CHOL 176 06/09/2012 1710   TRIG 199.0* 06/09/2012 1710   HDL 51.80 06/09/2012 1710   CHOLHDL 3 06/09/2012 1710   VLDL 39.8 06/09/2012 1710   LDLCALC 84 06/09/2012 1710    CBC    Component Value Date/Time   WBC 7.7 12/20/2012 1600   RBC 4.25 12/20/2012 1600   HGB 13.5 12/20/2012 1600   HCT 39.5 12/20/2012 1600   PLT 235.0 12/20/2012 1600   MCV 93.0 12/20/2012 1600   MCH 32.0 09/21/2012 1600   MCHC 34.0 12/20/2012 1600   RDW 12.1 12/20/2012 1600   LYMPHSABS 2.1 06/09/2012 1710   MONOABS 0.4 06/09/2012 1710   EOSABS 0.1 06/09/2012 1710   BASOSABS 0.0 06/09/2012 1710    Hgb A1C No results found for this basename: HGBA1C        Assessment & Plan:   Acute Pancreatitis:  Advised pt again to go to the ER- she declines saying  "I don't have any money to go to the ER" eRx for zofran and percocet If no better by the AM, go to the ER as this usually requires inpatient treatment

## 2013-04-22 ENCOUNTER — Other Ambulatory Visit: Payer: Self-pay | Admitting: Family Medicine

## 2013-10-22 ENCOUNTER — Ambulatory Visit (INDEPENDENT_AMBULATORY_CARE_PROVIDER_SITE_OTHER): Payer: 59

## 2013-10-22 DIAGNOSIS — Z23 Encounter for immunization: Secondary | ICD-10-CM

## 2013-12-21 ENCOUNTER — Other Ambulatory Visit: Payer: Self-pay | Admitting: *Deleted

## 2013-12-21 NOTE — Telephone Encounter (Signed)
Please schedule f/u after the holidays and refill until then  Thanks  

## 2013-12-21 NOTE — Telephone Encounter (Signed)
See prev note. I will refill Rx once f/u appt has been scheduled

## 2013-12-21 NOTE — Telephone Encounter (Signed)
Fax refill request, please advise  

## 2013-12-21 NOTE — Telephone Encounter (Signed)
I left a message for patient on her voice mail asking her to call and schedule follow up appointment after the holidays.

## 2013-12-28 NOTE — Telephone Encounter (Signed)
I left a second message for patient to call and schedule appointment after Holidays, so her medication can be refilled until the appointment.

## 2013-12-29 NOTE — Telephone Encounter (Signed)
Rx declined and pharmacy notified to have pt call and schedule appt

## 2014-01-02 NOTE — Telephone Encounter (Signed)
Left message asking pt to call office  °

## 2014-01-16 ENCOUNTER — Telehealth: Payer: Self-pay

## 2014-01-16 NOTE — Telephone Encounter (Signed)
Left voicemail requesting pt to call office back 

## 2014-01-16 NOTE — Telephone Encounter (Signed)
Nicole Hughes with [facility] said that since 01/13/14 pt has been vomiting and lost 8 lbs. Pt has LUQ pain that goes thru to the back,chest pain but no fever. Pt has hx of pancreatitis. Disposition is ED but pt refuses.pt has appt for head cold scheduled 01/17/14 at 3:30 pm.

## 2014-01-16 NOTE — Telephone Encounter (Signed)
PLEASE NOTE: All timestamps contained within this report are represented as [place] Standard Time. CONFIDENTIALTY NOTICE: This fax transmission is intended only for the addressee. It contains information that is legally privileged, confidential or otherwise protected from use or disclosure. If you are not the intended recipient, you are strictly prohibited from reviewing, disclosing, copying using or disseminating any of this information or taking any action in reliance on or regarding this information. If you have received this fax in error, please notify us immediately by telephone so that we can arrange for its return to us. Phone: [phone], Toll-Free: [phone], Fax: [phone] Page: 1 of 2 Call Id: [id number] [facility] Client TELEPHONE ADVICE RECORD [facility] Patient Name: Nicole Hughes Gender: Female DOB: [birth year] Age: [age] Y 9 M 16 D Return Phone Number: [phone] (Primary) Address: [address] City/State/Zip: [place] [id number] Client [facility] Phys[place] Contact Type Call Call Type Triage / Clinical Relationship To Patient Self Appointment Disposition EMR Appointment Not Necessary Return Phone Number [phone] (Primary) Chief Complaint CHEST PAIN (>=21 years) - pain, pressure, heaviness or tightness Initial Comment Caller states has bad cold, dx pancreatitis year ago is feeling pain in chest again; like something stuck w/pressure and pain; out of BP meds for couple of months; endometriosis; nasal spray and hasn't been able to afford to come in; PreDisposition Call Doctor Nurse Assessment Nurse: Scarlette ArStandifer, RN, Herbert SetaHeather Date/Time (Eastern Time): 01/16/2014 10:34:25 AM Confirm and document reason for call. If symptomatic, describe symptoms. ---Caller states has bad cold, dx pancreatitis year ago is  feeling pain in chest again with upper left abd pain that goes through to her back.; like something stuck w/pressure and pain; out of BP meds for couple of months; endometriosis; nasal spray and hasn't been able to afford to come in; She has been vomiting all night, the pain started Friday Has the patient traveled out of the country within the last 30 days? ---Not Applicable Does the patient require triage? ---Yes Related visit to physician within the last 2 weeks? ---No Does the PT have any chronic conditions? (i.e. diabetes, asthma, etc.) ---Yes List chronic conditions. ---pancreatitis, HTN, Did the patient indicate they were pregnant? ---No Guidelines Guideline Title Affirmed Question Affirmed Notes Nurse Date/Time (Eastern Time) Abdominal Pain - Female [1] SEVERE pain (e.g., excruciating) AND [2] present > 1 hour Standifer, RN, Heather 01/16/2014 10:35:54 AM Disp. Time Lamount Cohen(Eastern Time) Disposition Final User 01/16/2014 10:30:47 AM Send to Urgent Trilby DrummerQueue Watson, Bjorn Loserhonda PLEASE NOTE: All timestamps contained within this report are represented as [place] Standard Time. CONFIDENTIALTY NOTICE: This fax transmission is intended only for the addressee. It contains information that is legally privileged, confidential or otherwise protected from use or disclosure. If you are not the intended recipient, you are strictly prohibited from reviewing, disclosing, copying using or disseminating any of this information or taking any action in reliance on or regarding this information. If you have received this fax in error, please notify us immediately by telephone so that we can arrange for its return to us. Phone: [phone], Toll-Free: [phone], Fax: [phone] Page: 2 of 2 Call Id: [id number] Disp. Time Lamount Cohen(Eastern Time) Disposition Final User 01/16/2014 10:51:45 AM Call Completed Standifer, RN, Herbert SetaHeather 01/16/2014 10:49:48 AM Go to ED Now Yes Standifer, RN, Sibyl ParrHeather Caller Understands:  Yes Disagree/Comply: Disagree Disagree/Comply Reason: Disagree with instructions Care Advice Given Per Guideline GO TO  ED NOW: You need to be seen in the Emergency Department. Go to the ER at ___________ Hospital. Leave now. Drive carefully. DRIVING: Another adult should drive. CARE ADVICE given per Abdominal Pain, Female (Adult) guideline. After Care Instructions Given Call Event Type User Date / Time Description Comments User: Tyrone AppleHeather, Standifer, RN Date/Time Lamount Cohen(Eastern Time): 01/16/2014 10:51:27 AM Caller states that she will not go to the ED b/c she does not have enough money to pay the copay, caller informed numerous times that she needs to go to the ED now. Caller states that she will just call and make an appt at the office for tomorrow. Referrals GO TO FACILITY REFUSED

## 2014-01-16 NOTE — Telephone Encounter (Signed)
PLEASE NOTE: All timestamps contained within this report are represented as [place] Standard Time. CONFIDENTIALTY NOTICE: This fax transmission is intended only for the addressee. It contains information that is legally privileged, confidential or otherwise protected from use or disclosure. If you are not the intended recipient, you are strictly prohibited from reviewing, disclosing, copying using or disseminating any of this information or taking any action in reliance on or regarding this information. If you have received this fax in error, please notify us immediately by telephone so that we can arrange for its return to us. Phone: [phone], Toll-Free: [phone], Fax: [phone] Page: 1 of 2 Call Id: [id number] [facility] - Client TELEPHONE ADVICE RECORD [facility] Patient Name: Nicole Hughes Gender: Female DOB: [birth year] Age: [age] Y 9 M 16 D Return Phone Number: [phone] (Primary) Address: [address] City/State/Zip: [place] [id number] Client [facility] Client [facility] [place] Phys[place], [place] Contact Type Call Call Type Triage / Clinical Relationship To Patient Self Appointment Disposition EMR Appointment Not Necessary Return Phone Number [phone] (Primary) Chief Complaint CHEST PAIN (>=21 years) - pain, pressure, heaviness or tightness Initial Comment Caller states has bad cold, dx pancreatitis year ago is feeling pain in chest again; like something stuck w/pressure and pain; out of BP meds for couple of months; endometriosis; nasal spray and hasn't been able to afford to come in; PreDisposition Call Doctor Nurse Assessment Nurse: Scarlette ArStandifer, RN, Herbert SetaHeather Date/Time (Eastern Time): 01/16/2014 10:34:25 AM Confirm and document reason for call. If symptomatic, describe symptoms. ---Caller states has bad cold, dx pancreatitis year ago is  feeling pain in chest again with upper left abd pain that goes through to her back.; like something stuck w/pressure and pain; out of BP meds for couple of months; endometriosis; nasal spray and hasn't been able to afford to come in; She has been vomiting all night, the pain started Friday Has the patient traveled out of the country within the last 30 days? ---Not Applicable Does the patient require triage? ---Yes Related visit to physician within the last 2 weeks? ---No Does the PT have any chronic conditions? (i.e. diabetes, asthma, etc.) ---Yes List chronic conditions. ---pancreatitis, HTN, Did the patient indicate they were pregnant? ---No Guidelines Guideline Title Affirmed Question Affirmed Notes Nurse Date/Time (Eastern Time) Abdominal Pain - Female [1] SEVERE pain (e.g., excruciating) AND [2] present > 1 hour Standifer, RN, Heather 01/16/2014 10:35:54 AM Disp. Time Lamount Cohen(Eastern Time) Disposition Final User 01/16/2014 10:30:47 AM Send to Urgent Trilby DrummerQueue Watson, Bjorn Loserhonda PLEASE NOTE: All timestamps contained within this report are represented as [place] Standard Time. CONFIDENTIALTY NOTICE: This fax transmission is intended only for the addressee. It contains information that is legally privileged, confidential or otherwise protected from use or disclosure. If you are not the intended recipient, you are strictly prohibited from reviewing, disclosing, copying using or disseminating any of this information or taking any action in reliance on or regarding this information. If you have received this fax in error, please notify us immediately by telephone so that we can arrange for its return to us. Phone: [phone], Toll-Free: [phone], Fax: [phone] Page: 2 of 2 Call Id: [id number] Disp. Time Lamount Cohen(Eastern Time) Disposition Final User 01/16/2014 10:51:45 AM Call Completed Standifer, RN, Herbert SetaHeather 01/16/2014 10:49:48 AM Go to ED Now Yes Standifer, RN, Sibyl ParrHeather Caller Understands:  Yes Disagree/Comply: Disagree Disagree/Comply Reason: Disagree with instructions Care Advice Given Per Guideline GO TO  ED NOW: You need to be seen in the Emergency Department. Go to the ER at ___________ Hospital. Leave now. Drive carefully. DRIVING: Another adult should drive. CARE ADVICE given per Abdominal Pain, Female (Adult) guideline. After Care Instructions Given Call Event Type User Date / Time Description Comments User: Tyrone Apple, RN Date/Time Lamount Cohen Time): 01/16/2014 10:51:27 AM Caller states that she will not go to the ED b/c she does not have enough money to pay the copay, caller informed numerous times that she needs to go to the ED now. Caller states that she will just call and make an appt at the office for tomorrow. User: Tyrone Apple, RN Date/Time Lamount Cohen Time): 01/16/2014 10:54:54 AM Called office, [phone] to let them know patient triaged to the ED and refusing and still requesting appt. Nurse states that the physician is not able to work her in today and there are no other appts for today. Nurse states to document triage outcome and what patient states in record. Referrals GO TO FACILITY REFUSED

## 2014-01-16 NOTE — Telephone Encounter (Signed)
See Rena's note, pt has appt scheduled tomorrow for "head cold", but that isn't her sxs listed in this message

## 2014-01-16 NOTE — Telephone Encounter (Signed)
Please check on her-it is concerning she has not gone to the ED for pancreatitis symptoms - if she is that bad when she gets here tomorrow- we may have to send her to the ED anyway (hopefully not)  Let me know how she is doing today Thanks

## 2014-01-17 ENCOUNTER — Encounter: Payer: Self-pay | Admitting: Family Medicine

## 2014-01-17 ENCOUNTER — Ambulatory Visit (INDEPENDENT_AMBULATORY_CARE_PROVIDER_SITE_OTHER): Payer: 59 | Admitting: Family Medicine

## 2014-01-17 VITALS — BP 158/92 | HR 128 | Temp 99.5°F | Ht 65.0 in | Wt 153.8 lb

## 2014-01-17 DIAGNOSIS — J0100 Acute maxillary sinusitis, unspecified: Secondary | ICD-10-CM

## 2014-01-17 DIAGNOSIS — R1013 Epigastric pain: Secondary | ICD-10-CM | POA: Insufficient documentation

## 2014-01-17 DIAGNOSIS — J019 Acute sinusitis, unspecified: Secondary | ICD-10-CM | POA: Insufficient documentation

## 2014-01-17 DIAGNOSIS — I10 Essential (primary) hypertension: Secondary | ICD-10-CM

## 2014-01-17 MED ORDER — AMLODIPINE BESYLATE 5 MG PO TABS: 5.0000 mg | ORAL_TABLET | Freq: Every day | ORAL | Status: DC

## 2014-01-17 MED ORDER — LISINOPRIL 10 MG PO TABS: 10.0000 mg | ORAL_TABLET | Freq: Every day | ORAL | Status: DC

## 2014-01-17 MED ORDER — AMOXICILLIN 500 MG PO CAPS: 500.0000 mg | ORAL_CAPSULE | Freq: Three times a day (TID) | ORAL | Status: DC

## 2014-01-17 MED ORDER — ACYCLOVIR 5 % EX OINT: 1.0000 "application " | TOPICAL_OINTMENT | CUTANEOUS | Status: DC

## 2014-01-17 MED ORDER — HYDROCHLOROTHIAZIDE 25 MG PO TABS: 25.0000 mg | ORAL_TABLET | Freq: Every day | ORAL | Status: DC

## 2014-01-17 MED ORDER — PROMETHAZINE HCL 25 MG/ML IJ SOLN
50.0000 mg | Freq: Once | INTRAMUSCULAR | Status: AC
  Administered 2014-01-17: 50 mg via INTRAMUSCULAR

## 2014-01-17 MED ORDER — PROMETHAZINE HCL 25 MG PO TABS: 25.0000 mg | ORAL_TABLET | Freq: Three times a day (TID) | ORAL | Status: DC | PRN

## 2014-01-17 NOTE — Patient Instructions (Signed)
Phenergan injection today  Phenergan sent to pharmacy If that does not control nausea and vomiting- go to the ER Sip fluids continuously - to prevent dehydration - if this causes vomiting-go to the ER  Labs today  Take amoxicillin for sinus infection - keep me posted on how you are doing  I sent zovirax for cold sores  Get back on your blood pressure medicine   Follow up with me in a month   If worse at all - go to the ER

## 2014-01-17 NOTE — Telephone Encounter (Signed)
Pt was seen and eval by Dr. Milinda Antisower today

## 2014-01-17 NOTE — Progress Notes (Signed)
Pre visit review using our clinic review tool, if applicable. No additional management support is needed unless otherwise documented below in the visit note. 

## 2014-01-17 NOTE — Progress Notes (Signed)
Subjective:    Patient ID: Nicole Hughes, female    DOB: [birth year], 44 y.o.   MRN: [id number]  HPI Here for several issues   Has had a bad cold  Coughing a lot - green dark yuck  Has had fever  Ears hurt Congestion and sinus pain   Nauseated and pain in chest on Friday Then vomiting and diarrhea  Thinks she has pancreatitis again  She "cannot" go to there ER because she cannot afford it  A lot of pain  Is out of phenergan    When seen last - no gallstones on [place]  Does not drink alcohol at all  Does not know why she keeps getting pancreatitis   Taking care of her father and not working  Really stressed    Has been seeing Dr Ellyn Hack (gyn)  Endometriosis Was on depo provera Then started med to put her into menopause (nasal)  Now on aygestin  Also zoloft  Giving her pain medicine - and ref to pain clinic  She wants a hysterectomy  Also had abn pap and colposcopy - and she still owes for that   Needs her bp med  Did not f/u after last visit    Needs topical zovirax for cold sores - refill   Patient Active Problem List   Diagnosis Date Noted  . Abdominal pain, epigastric 01/17/2014  . Acute sinusitis 01/17/2014  . 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 Ingrown toenail 08/24/2012  . Chest wall pain 08/20/2012  . Stress reaction 07/23/2012  . Essential hypertension 06/09/2012  . History of pulmonary embolism 06/09/2012  . Preop cardiovascular exam 06/08/2012  . Dysmenorrhea   . Dyspareunia   . Pelvic pain   . Cardiomyopathy   . Cyst of ovary    Past Medical History  Diagnosis Date  . Dysmenorrhea   . Dyspareunia   . Pelvic pain   . Pulmonary embolism   . Cardiomyopathy   . Cyst of ovary   . History of chicken pox   . History of depression   . History of migraine     and frequent Headaches  . History of hay fever   . History of blood clots   . Complication of anesthesia     "hard to get under"  . Endometriosis of pelvic peritoneum  09/24/2012   Past Surgical History  Procedure Laterality Date  . Cesarean section  1994  . Ectopic pregnancy surgery    . Abdominal exploration surgery    . Tubal ligation  2010  . Laparoscopy N/A 09/24/2012    Procedure: OPERATIVE LAPROSCOPY WITH LYSIS OF ADHESIONS;  Surgeon: Sherron Monday, MD;  Location: [facility];  Service: Gynecology;  Laterality: N/A;   History  Substance Use Topics  . Smoking status: Never Smoker   . Smokeless tobacco: Not on file  . Alcohol Use: 0.0 oz/week    0 Not specified per week     Comment: occ   Family History  Problem Relation Age of Onset  . Cancer - Lung Mother   . Heart failure Father   . Hypertension Father    No Known Allergies Current Outpatient Prescriptions on File Prior to Visit  Medication Sig Dispense Refill  . diphenhydramine-acetaminophen (TYLENOL PM) 25-500 MG TABS Take 2 tablets by mouth at bedtime as needed (for sleep).    Marland Kitchen ibuprofen (ADVIL,MOTRIN) 200 MG tablet Take 4 tablets (800 mg total) by mouth every 8 (eight) hours as needed for pain. 45 tablet  1  . medroxyPROGESTERone (DEPO-PROVERA) 150 MG/ML injection     . ondansetron (ZOFRAN) 4 MG tablet Take 1 tablet (4 mg total) by mouth every 8 (eight) hours as needed. 20 tablet 0  . oxyCODONE-acetaminophen (ROXICET) 5-325 MG per tablet Take 1-2 tablets by mouth every 6 (six) hours as needed. 30 tablet 0  . busPIRone (BUSPAR) 15 MG tablet Take 0.5 tablets (7.5 mg total) by mouth 2 (two) times daily. (Patient not taking: Reported on 01/17/2014) 30 tablet 5   No current facility-administered medications on file prior to visit.    Review of Systems Review of Systems  Constitutional: Negative for fever, appetite change,  and unexpected weight change.  ENT pos for cong and rhinorrhea and sinus pain  Eyes: Negative for pain and visual disturbance.  Respiratory: Negative for wheeze and shortness of breath.   Cardiovascular: Negative for cp or palpitations    Gastrointestinal: Negative for  diarrhea and constipation. pos for epigastric pain and n/v, neg for blood in stool or dark stool Genitourinary: Negative for urgency and frequency. pos for severe chronic pelvic pain  Skin: Negative for pallor or rash   Neurological: Negative for weakness, light-headedness, numbness and headaches.  Hematological: Negative for adenopathy. Does not bruise/bleed easily.  Psychiatric/Behavioral: Negative for dysphoric mood. Pos for anxiety and caregiver stress        Objective:   Physical Exam  Constitutional: She appears well-developed and well-nourished. No distress.  HENT:  Head: Normocephalic and atraumatic.  Right Ear: External ear normal.  Left Ear: External ear normal.  Mouth/Throat: Oropharynx is clear and moist. No oropharyngeal exudate.  Nares are injected and congested  bilat frontal and maxillary sinus tenderness  Throat clear   Eyes: Conjunctivae and EOM are normal. Pupils are equal, round, and reactive to light. Right eye exhibits no discharge. Left eye exhibits no discharge. No scleral icterus.  Neck: Normal range of motion. Neck supple.  Cardiovascular: Normal rate and regular rhythm.   Pulmonary/Chest: Effort normal and breath sounds normal. No respiratory distress. She has no wheezes. She has no rales. She exhibits no tenderness.  Abdominal: Soft. Bowel sounds are normal. She exhibits no distension. There is no hepatosplenomegaly. There is tenderness in the epigastric area. There is no rigidity, no guarding, no CVA tenderness, no tenderness at McBurney's point and negative Murphy's sign.  Musculoskeletal: She exhibits no edema.  Lymphadenopathy:    She has no cervical adenopathy.  Neurological: She is alert. She has normal reflexes. No cranial nerve deficit. She exhibits normal muscle tone. Coordination normal.  Skin: Skin is warm and dry. No rash noted. No erythema. No pallor.  No jaundice   Psychiatric: Her speech is normal and behavior is normal. Her mood appears  anxious. Thought content is not paranoid. She exhibits a depressed mood. She expresses no homicidal and no suicidal ideation.  Tearful throughout interview- seems very anxious and depressed  Mostly discusses chronic pain           Assessment & Plan:   Problem List Items Addressed This Visit      Cardiovascular and Mediastinum   Essential hypertension - Primary    Pt has been off med due to inability to come in for appt/ afford it  Is ready to start back  Refills done  Lab today  F/u 1 mo      Relevant Medications   amLODIpine (NORVASC) tablet   hydrochlorothiazide tablet   lisinopril (PRINIVIL,ZESTRIL) tablet   Other Relevant Orders   CBC with  Differential (Completed)   Comprehensive metabolic panel (Completed)     Respiratory   Acute sinusitis    Cover with amoxicillin Disc symptomatic care - see instructions on AVS  Update if not starting to improve in a week or if worsening        Relevant Medications   promethazine (PHENERGAN)  tablet   amoxicillin (AMOXIL) capsule   promethazine (PHENERGAN) injection 50 mg (Completed)     Other   Abdominal pain, epigastric    Hx of pancreatitis in the past of  ? Cause (no etoh and no gallstones on US) Suspect recurrent Lab today  Phenergan for nausea - IM and then po  Will likely need GI ref but finances prohibit her from appts  Made aware that she may not improve without hospitalization - she is aware but states she cannot afford ED visit or hosp and will not go (she told this to the answering service as well) Also dealing with chronic pain from endometriosis and seems depressed       Relevant Medications   promethazine (PHENERGAN) injection 50 mg (Completed)   Other Relevant Orders   CBC with Differential (Completed)   Amylase (Completed)   Lipase (Completed)

## 2014-01-18 ENCOUNTER — Telehealth: Payer: Self-pay | Admitting: Family Medicine

## 2014-01-18 LAB — CBC WITH DIFFERENTIAL/PLATELET
Basophils Absolute: 0 10*3/uL (ref 0.0–0.1)
Basophils Relative: 0.8 % (ref 0.0–3.0)
Eosinophils Absolute: 0.1 10*3/uL (ref 0.0–0.7)
Eosinophils Relative: 2.2 % (ref 0.0–5.0)
HCT: 43.3 % (ref 36.0–46.0)
Hemoglobin: 14.6 g/dL (ref 12.0–15.0)
Lymphocytes Relative: 43.8 % (ref 12.0–46.0)
Lymphs Abs: 2.6 10*3/uL (ref 0.7–4.0)
MCHC: 33.8 g/dL (ref 30.0–36.0)
MCV: 95.2 fl (ref 78.0–100.0)
Monocytes Absolute: 0.9 10*3/uL (ref 0.1–1.0)
Monocytes Relative: 15.7 % — ABNORMAL HIGH (ref 3.0–12.0)
Neutro Abs: 2.2 10*3/uL (ref 1.4–7.7)
Neutrophils Relative %: 37.5 % — ABNORMAL LOW (ref 43.0–77.0)
Platelets: 225 10*3/uL (ref 150.0–400.0)
RBC: 4.54 Mil/uL (ref 3.87–5.11)
RDW: 11.9 % (ref 11.5–15.5)
WBC: 5.9 10*3/uL (ref 4.0–10.5)

## 2014-01-18 LAB — COMPREHENSIVE METABOLIC PANEL
ALT: 64 U/L — ABNORMAL HIGH (ref 0–35)
AST: 44 U/L — ABNORMAL HIGH (ref 0–37)
Albumin: 4.8 g/dL (ref 3.5–5.2)
Alkaline Phosphatase: 35 U/L — ABNORMAL LOW (ref 39–117)
BUN: 16 mg/dL (ref 6–23)
CO2: 26 mEq/L (ref 19–32)
Calcium: 9.9 mg/dL (ref 8.4–10.5)
Chloride: 104 mEq/L (ref 96–112)
Creatinine, Ser: 0.68 mg/dL (ref 0.40–1.20)
GFR: 100.95 mL/min (ref >60.00)
Glucose, Bld: 94 mg/dL (ref 70–99)
Potassium: 4 mEq/L (ref 3.5–5.1)
Sodium: 138 mEq/L (ref 135–145)
Total Bilirubin: 0.4 mg/dL (ref 0.2–1.2)
Total Protein: 7.4 g/dL (ref 6.0–8.3)

## 2014-01-18 LAB — AMYLASE: Amylase: 48 U/L (ref 27–131)

## 2014-01-18 LAB — LIPASE: Lipase: 56 U/L (ref 11.0–59.0)

## 2014-01-18 NOTE — Telephone Encounter (Signed)
emmi emailed °

## 2014-01-19 NOTE — Assessment & Plan Note (Signed)
Pt has been off med due to inability to come in for appt/ afford it  Is ready to start back  Refills done  Lab today  F/u 1 mo

## 2014-01-19 NOTE — Assessment & Plan Note (Signed)
Hx of pancreatitis in the past of  ? Cause (no etoh and no gallstones on US) Suspect recurrent Lab today  Phenergan for nausea - IM and then po  Will likely need GI ref but finances prohibit her from appts  Made aware that she may not improve without hospitalization - she is aware but states she cannot afford ED visit or hosp and will not go (she told this to the answering service as well) Also dealing with chronic pain from endometriosis and seems depressed

## 2014-01-19 NOTE — Assessment & Plan Note (Signed)
Cover with amoxicillin Disc symptomatic care - see instructions on AVS  Update if not starting to improve in a week or if worsening

## 2014-03-14 ENCOUNTER — Ambulatory Visit (INDEPENDENT_AMBULATORY_CARE_PROVIDER_SITE_OTHER): Payer: 59 | Admitting: Internal Medicine

## 2014-03-14 ENCOUNTER — Telehealth: Payer: Self-pay

## 2014-03-14 ENCOUNTER — Encounter: Payer: Self-pay | Admitting: Internal Medicine

## 2014-03-14 ENCOUNTER — Other Ambulatory Visit: Payer: Self-pay | Admitting: Internal Medicine

## 2014-03-14 VITALS — BP 164/106 | HR 86 | Temp 98.4°F | Wt 156.0 lb

## 2014-03-14 DIAGNOSIS — S29011A Strain of muscle and tendon of front wall of thorax, initial encounter: Secondary | ICD-10-CM

## 2014-03-14 DIAGNOSIS — I10 Essential (primary) hypertension: Secondary | ICD-10-CM

## 2014-03-14 MED ORDER — KETOROLAC TROMETHAMINE 60 MG/2ML IM SOLN
60.0000 mg | Freq: Once | INTRAMUSCULAR | Status: AC
  Administered 2014-03-14: 60 mg via INTRAMUSCULAR

## 2014-03-14 MED ORDER — CYCLOBENZAPRINE HCL 5 MG PO TABS: 5.0000 mg | ORAL_TABLET | Freq: Three times a day (TID) | ORAL | Status: DC | PRN

## 2014-03-14 MED ORDER — METHOCARBAMOL 500 MG PO TABS
500.0000 mg | ORAL_TABLET | Freq: Three times a day (TID) | ORAL | Status: DC
Start: 2014-03-14 — End: 2014-03-14

## 2014-03-14 MED ORDER — KETOROLAC TROMETHAMINE 10 MG PO TABS: 10.0000 mg | ORAL_TABLET | Freq: Three times a day (TID) | ORAL | Status: DC | PRN

## 2014-03-14 NOTE — Progress Notes (Signed)
Subjective:    Patient ID: Nicole Hughes, female    DOB: 02/05/1970, 44 y.o.   MRN: [id number]  HPI  Pt presents to the clinic today with c/o chest wall pain. She reports this started 2 weeks ago but has progressively gotten worse. She reports the pain is stabbing and burning, around her left breast. The pain is worse with taking a deep breath or movement. She has had sneezing and coughing for the last month which she thinks may have exacerbated this pain. The cough is non productive. She has been taking allergy medication OTC with some relief. She denies chest pain, shortness of breath, dizziness or blurred vision. She has had a PE in the past and reports this feels completely different.  Of note, her blood pressure is elevated today at 164/106. She has taken her BP medications today but reports her blood pressure gets very elevated when she is in pain.  Review of Systems      Past Medical History  Diagnosis Date  . Dysmenorrhea   . Dyspareunia   . Pelvic pain   . Pulmonary embolism   . Cardiomyopathy   . Cyst of ovary   . History of chicken pox   . History of depression   . History of migraine     and frequent Headaches  . History of hay fever   . History of blood clots   . Complication of anesthesia     "hard to get under"  . 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/24/2012    Current Outpatient Prescriptions  Medication Sig Dispense Refill  . amLODipine (NORVASC) 5 MG tablet Take 1 tablet (5 mg total) by mouth daily. 30 tablet 5  . hydrochlorothiazide (HYDRODIURIL) 25 MG tablet Take 1 tablet (25 mg total) by mouth daily. 30 tablet 5  . ibuprofen (ADVIL,MOTRIN) 200 MG tablet Take 4 tablets (800 mg total) by mouth every 8 (eight) hours as needed for pain. 45 tablet 1  . lisinopril (PRINIVIL,ZESTRIL) 10 MG tablet Take 1 tablet (10 mg total) by mouth daily. 30 tablet 5  . Nafarelin Acetate (SYNAREL NA) Place 2 puffs into the nose 2 (two) times daily.    . norethindrone  (AYGESTIN) 5 MG tablet Take 5 mg by mouth daily.    Marland Kitchen. oxyCODONE-acetaminophen (ROXICET) 5-325 MG per tablet Take 1-2 tablets by mouth every 6 (six) hours as needed. 30 tablet 0  . sertraline (ZOLOFT) 50 MG tablet Take 50 mg by mouth daily.     No current facility-administered medications for this visit.    No Known Allergies  Family History  Problem Relation Age of Onset  . Cancer - Lung Mother   . Heart failure Father   . Hypertension Father     History   Social History  . Marital Status: Married    Spouse Name: N/A  . Number of Children: N/A  . Years of Education: N/A   Occupational History  . Not on file.   Social History Main Topics  . Smoking status: Never Smoker   . Smokeless tobacco: Not on file  . Alcohol Use: 0.0 oz/week    0 Standard drinks or equivalent per week     Comment: occ  . Drug Use: No  . Sexual Activity: Not on file   Other Topics Concern  . Not on file   Social History Narrative     Constitutional: Denies fever, malaise, fatigue, headache or abrupt weight changes.  HEENT: Denies eye pain, eye redness, ear  pain, ringing in the ears, wax buildup, runny nose, nasal congestion, bloody nose, or sore throat. Respiratory: Pt reports cough. Denies difficulty breathing, shortness of breath,  or sputum production.   Cardiovascular: Denies chest pain, chest tightness, palpitations or swelling in the hands or feet.  Musculoskeletal: Pt reports chest wall pain. Denies decrease in range of motion, difficulty with gait, or joint pain and swelling.   No other specific complaints in a complete review of systems (except as listed in HPI above).  Objective:   Physical Exam   BP 164/106 mmHg  Pulse 86  Temp(Src) 98.4 F (36.9 C) (Oral)  Wt 156 lb (70.761 kg)  SpO2 99%  LMP 01/22/2014 Wt Readings from Last 3 Encounters:  03/14/14 156 lb (70.761 kg)  01/17/14 153 lb 12.8 oz (69.763 kg)  12/27/12 155 lb 8 oz (70.534 kg)    General: Appears her stated  age, appears to be in pain, but in NAD. Skin: Warm, dry and intact. No rashes, lesions or ulcerations noted. HEENT: Head: normal shape and size, no sinus tenderness noted;  Ears: Tm's gray and intact, normal light reflex; Nose: mucosa boggy and moist, septum midline; Throat/Mouth: Teeth present, mucosa pink and moist, no exudate, lesions or ulcerations noted.  Neck: No adenopathy noted.  Cardiovascular: Tachycardic with normal rhythm. S1,S2 noted.  No murmur, rubs or gallops noted.  Pulmonary/Chest: Shallow breathing. Positive vesicular breath sounds. No respiratory distress. No wheezes, rales or ronchi noted.  Musculoskeletal: Chest wall pain reproduced with palpation. Worse with external rotation of shoulders. Neurological: Alert and oriented.  Psychiatric: Mood very anxious.  BMET    Component Value Date/Time   NA 138 01/17/2014 1630   K 4.0 01/17/2014 1630   CL 104 01/17/2014 1630   CO2 26 01/17/2014 1630   GLUCOSE 94 01/17/2014 1630   BUN 16 01/17/2014 1630   CREATININE 0.68 01/17/2014 1630   CALCIUM 9.9 01/17/2014 1630   GFRNONAA >90 09/21/2012 1600   GFRAA >90 09/21/2012 1600    Lipid Panel     Component Value Date/Time   CHOL 176 06/09/2012 1710   TRIG 199.0* 06/09/2012 1710   HDL 51.80 06/09/2012 1710   CHOLHDL 3 06/09/2012 1710   VLDL 39.8 06/09/2012 1710   LDLCALC 84 06/09/2012 1710    CBC    Component Value Date/Time   WBC 5.9 01/17/2014 1630   RBC 4.54 01/17/2014 1630   HGB 14.6 01/17/2014 1630   HCT 43.3 01/17/2014 1630   PLT 225.0 01/17/2014 1630   MCV 95.2 01/17/2014 1630   MCH 32.0 09/21/2012 1600   MCHC 33.8 01/17/2014 1630   RDW 11.9 01/17/2014 1630   LYMPHSABS 2.6 01/17/2014 1630   MONOABS 0.9 01/17/2014 1630   EOSABS 0.1 01/17/2014 1630   BASOSABS 0.0 01/17/2014 1630    Hgb A1C No results found for: HGBA1C     Assessment & Plan:   Musculoskeletal chest wall pain:  She declines ECG to r/o ACS She declines chest xray to r/o lung  abnormality 60 mg Toradol IM today Continue Ibuprofen for inflammation RX for Tramadol for severe pain only RX for Robaxin to help relax muscles  HTN:  Elevated secondary to pain response Continue current medications for now Will need to follow up with PCP once pain improves to verify that BP returns to normal  If no improvement within  48 hours, follow up with PCP, sooner if worse

## 2014-03-14 NOTE — Progress Notes (Signed)
Pre visit review using our clinic review tool, if applicable. No additional management support is needed unless otherwise documented below in the visit note. 

## 2014-03-14 NOTE — Telephone Encounter (Signed)
PLEASE NOTE: All timestamps contained within this report are represented as [place] Standard Time. CONFIDENTIALTY NOTICE: This fax transmission is intended only for the addressee. It contains information that is legally privileged, confidential or otherwise protected from use or disclosure. If you are not the intended recipient, you are strictly prohibited from reviewing, disclosing, copying using or disseminating any of this information or taking any action in reliance on or regarding this information. If you have received this fax in error, please notify us immediately by telephone so that we can arrange for its return to us. Phone: [phone], Toll-Free: [phone], Fax: [phone] Page: 1 of 1 Call Id: [id number] [facility] - Client TELEPHONE ADVICE RECORD [facility] Patient Name: Nicole FairMARGARET Gurganus Gender: Female DOB: [birth year] Age: [age] Y 11 M 11 D Return Phone Number: [phone] (Primary) Address: [address] City/State/Zip: [place] [address] Client [place] [facility] Contact Type Call Caller Name Gabbie Nardone Caller Phone Number na Relationship To Patient Self Is this call to report lab results? No Call Type General Information Initial Comment Caller States she is having stabbing pain her chest under her left breast. hurts to ever touch her chest, cough or move her arms. does have an appt. today at 2pm she did show up in the system but call got disconnected before dob and phone number could be verified. General Information Type Message Only Nurse Assessment Guidelines Guideline Title Affirmed Question Affirmed Notes Nurse Date/Time (Eastern Time) Disp. Time Lamount Cohen(Eastern Time) Disposition Final User 03/14/2014 11:02:14 AM General Information Provided Yes Salvatore MarvelMiller, David After Care Instructions Given Call Event Type User Date / Time Description

## 2014-03-14 NOTE — Telephone Encounter (Signed)
Pt already has appt scheduled today at 2 pm with Nicki Reaperegina Baity NP.

## 2014-03-14 NOTE — Patient Instructions (Signed)

## 2014-03-17 ENCOUNTER — Ambulatory Visit: Payer: 59 | Admitting: Family Medicine

## 2014-04-25 ENCOUNTER — Ambulatory Visit: Payer: 59 | Admitting: Internal Medicine

## 2014-04-26 ENCOUNTER — Encounter: Payer: Self-pay | Admitting: Primary Care

## 2014-04-26 ENCOUNTER — Ambulatory Visit (INDEPENDENT_AMBULATORY_CARE_PROVIDER_SITE_OTHER): Payer: 59 | Admitting: Primary Care

## 2014-04-26 ENCOUNTER — Telehealth: Payer: Self-pay | Admitting: Primary Care

## 2014-04-26 ENCOUNTER — Ambulatory Visit (INDEPENDENT_AMBULATORY_CARE_PROVIDER_SITE_OTHER)
Admission: RE | Admit: 2014-04-26 | Discharge: 2014-04-26 | Disposition: A | Discharge status: Home / Self Care | Payer: 59 | Source: Ambulatory Visit | Attending: Primary Care | Admitting: Primary Care

## 2014-04-26 VITALS — BP 142/102 | HR 80 | Temp 98.3°F | Ht 65.0 in | Wt 152.0 lb

## 2014-04-26 DIAGNOSIS — R1012 Left upper quadrant pain: Secondary | ICD-10-CM | POA: Diagnosis not present

## 2014-04-26 DIAGNOSIS — M62838 Other muscle spasm: Secondary | ICD-10-CM

## 2014-04-26 LAB — CBC WITH DIFFERENTIAL/PLATELET
Basophils Absolute: 0 10*3/uL (ref 0.0–0.1)
Basophils Relative: 0.4 % (ref 0.0–3.0)
Eosinophils Absolute: 0.4 10*3/uL (ref 0.0–0.7)
Eosinophils Relative: 6.5 % — ABNORMAL HIGH (ref 0.0–5.0)
HCT: 43.7 % (ref 36.0–46.0)
Hemoglobin: 15.1 g/dL — ABNORMAL HIGH (ref 12.0–15.0)
Lymphocytes Relative: 40.6 % (ref 12.0–46.0)
Lymphs Abs: 2.4 10*3/uL (ref 0.7–4.0)
MCHC: 34.7 g/dL (ref 30.0–36.0)
MCV: 92.5 fl (ref 78.0–100.0)
Monocytes Absolute: 0.5 10*3/uL (ref 0.1–1.0)
Monocytes Relative: 8.1 % (ref 3.0–12.0)
Neutro Abs: 2.6 10*3/uL (ref 1.4–7.7)
Neutrophils Relative %: 44.4 % (ref 43.0–77.0)
Platelets: 228 10*3/uL (ref 150.0–400.0)
RBC: 4.72 Mil/uL (ref 3.87–5.11)
RDW: 12.7 % (ref 11.5–15.5)
WBC: 5.9 10*3/uL (ref 4.0–10.5)

## 2014-04-26 LAB — COMPREHENSIVE METABOLIC PANEL
ALT: 39 U/L — ABNORMAL HIGH (ref 0–35)
AST: 32 U/L (ref 0–37)
Albumin: 4.7 g/dL (ref 3.5–5.2)
Alkaline Phosphatase: 39 U/L (ref 39–117)
BUN: 12 mg/dL (ref 6–23)
CO2: 24 mEq/L (ref 19–32)
Calcium: 9.7 mg/dL (ref 8.4–10.5)
Chloride: 105 mEq/L (ref 96–112)
Creatinine, Ser: 0.7 mg/dL (ref 0.40–1.20)
GFR: 97.5 mL/min (ref >60.00)
Glucose, Bld: 86 mg/dL (ref 70–99)
Potassium: 4.3 mEq/L (ref 3.5–5.1)
Sodium: 137 mEq/L (ref 135–145)
Total Bilirubin: 0.4 mg/dL (ref 0.2–1.2)
Total Protein: 7.2 g/dL (ref 6.0–8.3)

## 2014-04-26 MED ORDER — CYCLOBENZAPRINE HCL 5 MG PO TABS: 5.0000 mg | ORAL_TABLET | Freq: Three times a day (TID) | ORAL | Status: DC | PRN

## 2014-04-26 MED ORDER — KETOROLAC TROMETHAMINE 60 MG/2ML IM SOLN
60.0000 mg | Freq: Once | INTRAMUSCULAR | Status: AC
  Administered 2014-04-26: 60 mg via INTRAMUSCULAR

## 2014-04-26 MED ORDER — OXYCODONE-ACETAMINOPHEN 5-325 MG PO TABS: 1.0000 | ORAL_TABLET | Freq: Three times a day (TID) | ORAL | Status: DC | PRN

## 2014-04-26 MED ORDER — IOHEXOL 300 MG/ML  SOLN
100.0000 mL | Freq: Once | INTRAMUSCULAR | Status: AC | PRN
  Administered 2014-04-26: 100 mL via INTRAVENOUS

## 2014-04-26 NOTE — Telephone Encounter (Signed)
Left voicemail for patient to call back. 

## 2014-04-26 NOTE — Telephone Encounter (Signed)
Called and spoken to patient. Notified her of Kate's comments. Patient verbalized understanding. Patient will call back to schedule a follow up appt this Friday.

## 2014-04-26 NOTE — Assessment & Plan Note (Addendum)
Severe pain to LLQ, tender upon examination.  Denies shortness of breath and chest pain. No evidence of hernia on exam. CBC and CMP unremarkable. Will add on Lipase due to history of pancreatitis. Pain medication and Flexeril ordered for probably muscle spasm. Stat CT abdomen/pelvis ordered:  IMPRESSION: 1. No explanation for the patient's left upper quadrant pain is seen. 2. Diffuse fatty infiltration of the liver. No gallstones. 3. No hiatal hernia is seen. 4. Suspect small uterine fibroids  Follow up Friday this week for re-evaluation. Go to the emergency department if development of worsening pain, chest pain, shortness of breath, fevers. She verbalized understanding.

## 2014-04-26 NOTE — Progress Notes (Signed)
Subjective:    Patient ID: Nicole Hughes, female    DOB: April 17, 1970, 44 y.o.   MRN: [id number]  HPI  Nicole Hughes is a 44 year old female who presents today with a chief complaint of pain to anterior chest under left breast for the past 10 days with worsening pain over the past 2 days. Yesterday she noticed a "knot" to her left upper quadrant that was tender and immobile. The pain is constant but is worse when laying flat, coughing, sneezing, or exerting pressure to her abdominal cavity. She's not been able to eat much over the past 24 hours due to nausea and pain. Denies diarrhea, constipation, bloody stools, fevers, pleuritic chest pain.     Review of Systems  Constitutional: Positive for appetite change. Negative for fever.  Respiratory: Negative for cough, shortness of breath and wheezing.        Chest wall pain under left breast.  Cardiovascular: Negative for chest pain, palpitations and leg swelling.  Gastrointestinal: Positive for nausea and abdominal pain. Negative for vomiting, diarrhea, constipation and blood in stool.  Neurological: Positive for dizziness.       Past Medical History  Diagnosis Date  . Dysmenorrhea   . Dyspareunia   . Pelvic pain   . Pulmonary embolism   . Cardiomyopathy   . Cyst of ovary   . History of chicken pox   . History of depression   . History of migraine     and frequent Headaches  . History of hay fever   . History of blood clots   . Complication of anesthesia     "hard to get under"  . 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/24/2012    History   Social History  . Marital Status: Married    Spouse Name: N/A  . Number of Children: N/A  . Years of Education: N/A   Occupational History  . Not on file.   Social History Main Topics  . Smoking status: Never Smoker   . Smokeless tobacco: Not on file  . Alcohol Use: 0.0 oz/week    0 Standard drinks or equivalent per week     Comment: occ  . Drug Use: No  . Sexual Activity: Not on  file   Other Topics Concern  . Not on file   Social History Narrative    Past Surgical History  Procedure Laterality Date  . Cesarean section  1994  . Ectopic pregnancy surgery    . Abdominal exploration surgery    . Tubal ligation  2010  . Laparoscopy N/A 09/24/2012    Procedure: OPERATIVE LAPROSCOPY WITH LYSIS OF ADHESIONS;  Surgeon: Sherron MondayJody [name], MD;  Location: [facility];  Service: Gynecology;  Laterality: N/A;    Family History  Problem Relation Age of Onset  . Cancer - Lung Mother   . Heart failure Father   . Hypertension Father     No Known Allergies  Current Outpatient Prescriptions on File Prior to Visit  Medication Sig Dispense Refill  . amLODipine (NORVASC) 5 MG tablet Take 1 tablet (5 mg total) by mouth daily. 30 tablet 5  . hydrochlorothiazide (HYDRODIURIL) 25 MG tablet Take 1 tablet (25 mg total) by mouth daily. 30 tablet 5  . ibuprofen (ADVIL,MOTRIN) 200 MG tablet Take 4 tablets (800 mg total) by mouth every 8 (eight) hours as needed for pain. 45 tablet 1  . lisinopril (PRINIVIL,ZESTRIL) 10 MG tablet Take 1 tablet (10 mg total) by mouth daily. 30 tablet 5  .  Nafarelin Acetate (SYNAREL NA) Place 2 puffs into the nose 2 (two) times daily.    . norethindrone (AYGESTIN) 5 MG tablet Take 5 mg by mouth daily.    . sertraline (ZOLOFT) 50 MG tablet Take 50 mg by mouth daily.     No current facility-administered medications on file prior to visit.    BP 142/102 mmHg  Pulse 80  Temp(Src) 98.3 F (36.8 C) (Oral)  Ht  (1.651 m)  Wt 152 lb (68.947 kg)  BMI 25.29 kg/m2  SpO2 98%  LMP 03/20/2014    Objective:   Physical Exam  Constitutional: She is oriented to person, place, and time.  Appears to be in severe pain. She is walking and moving slowly.  Neck: Neck supple.  Cardiovascular: Normal rate, regular rhythm and normal heart sounds.   Pulmonary/Chest: Effort normal and breath sounds normal.  Abdominal: Soft. Normal appearance and bowel sounds are normal.  She exhibits no distension and no mass. There is no splenomegaly or hepatomegaly. There is tenderness in the left upper quadrant. There is guarding.  No hernia observed on exam. She had a very difficult time laying down on the exam table.  Lymphadenopathy:    She has no cervical adenopathy.  Neurological: She is alert and oriented to person, place, and time.  Skin: Skin is warm and dry.          Assessment & Plan:

## 2014-04-26 NOTE — Telephone Encounter (Signed)
Please notify Ms. Nicole Hughes that I have sent in some flexeril to her pharmacy. Her pain may be due to a musculoskeletal strain or tear. I would also like to see her back in the office Friday for follow up. Will you please schedule?  Thanks!

## 2014-04-26 NOTE — Patient Instructions (Signed)
Complete lab work prior to leaving today. I will notify you of your results. Go obtain your CT scan now at the [facility] [address] [facility]. Start percocet every 8 hours as needed for pain. Take 1-2 tablets. I will be in touch with you later regarding your CT results.

## 2014-04-27 ENCOUNTER — Other Ambulatory Visit (INDEPENDENT_AMBULATORY_CARE_PROVIDER_SITE_OTHER): Payer: 59

## 2014-04-27 DIAGNOSIS — E755 Other lipid storage disorders: Secondary | ICD-10-CM

## 2014-04-27 DIAGNOSIS — IMO0001 Reserved for inherently not codable concepts without codable children: Secondary | ICD-10-CM

## 2014-04-27 LAB — LIPASE: Lipase: 54 U/L (ref 11.0–59.0)

## 2014-04-27 NOTE — Telephone Encounter (Signed)
Patient have a follow up appt with Mayra ReelKate Clark at 2 pm on 04/28/14.

## 2014-04-28 ENCOUNTER — Encounter: Payer: Self-pay | Admitting: Primary Care

## 2014-04-28 ENCOUNTER — Ambulatory Visit (INDEPENDENT_AMBULATORY_CARE_PROVIDER_SITE_OTHER)
Admission: RE | Admit: 2014-04-28 | Discharge: 2014-04-28 | Disposition: A | Discharge status: Home / Self Care | Payer: 59 | Source: Ambulatory Visit | Attending: Primary Care | Admitting: Primary Care

## 2014-04-28 ENCOUNTER — Ambulatory Visit (INDEPENDENT_AMBULATORY_CARE_PROVIDER_SITE_OTHER): Payer: 59 | Admitting: Primary Care

## 2014-04-28 VITALS — BP 116/74 | HR 86 | Temp 98.3°F | Ht 65.0 in | Wt 157.0 lb

## 2014-04-28 DIAGNOSIS — M62838 Other muscle spasm: Secondary | ICD-10-CM | POA: Diagnosis not present

## 2014-04-28 DIAGNOSIS — R1012 Left upper quadrant pain: Secondary | ICD-10-CM

## 2014-04-28 MED ORDER — CYCLOBENZAPRINE HCL 5 MG PO TABS: 5.0000 mg | ORAL_TABLET | Freq: Three times a day (TID) | ORAL | Status: DC | PRN

## 2014-04-28 MED ORDER — OMEPRAZOLE 20 MG PO CPDR: 20.0000 mg | DELAYED_RELEASE_CAPSULE | Freq: Every day | ORAL | Status: DC

## 2014-04-28 MED ORDER — OXYCODONE-ACETAMINOPHEN 5-325 MG PO TABS: 1.0000 | ORAL_TABLET | Freq: Three times a day (TID) | ORAL | Status: DC | PRN

## 2014-04-28 NOTE — Assessment & Plan Note (Signed)
Slightly improved, but still in a lot of pain. Very tender upon palpation to LUQ superior to ribs. DG chest and left ribs today. Start PPI in case this is gastritis. Education provided to patient to watch for a rash in case this is shingles. Refilled percocet and flexeril for pain management. Instructions to go to the emergency department if she experiences worsening pain over the weekend. She verbalized understanding. Follow up in one week or sooner if needed.

## 2014-04-28 NOTE — Patient Instructions (Signed)
Complete xray(s) prior to leaving today. I will contact you with your results. Start the omeprazole daily for your pain. You may take 1-2 Flexeril tablets three times daily as needed for muscle spasms. This medication may make you drowsy, please be cautious. You may take 1-2 tablets of the Percocet three times daily as needed for pain, This medication may make you drowsy, please be cautious. If you feel worse over the weekend, please go to the emergency room. Follow up with Dr. Milinda Antisower or myself in one next week for re-evaluation.  Take care!

## 2014-04-28 NOTE — Progress Notes (Signed)
Pre visit review using our clinic review tool, if applicable. No additional management support is needed unless otherwise documented below in the visit note. 

## 2014-04-28 NOTE — Progress Notes (Signed)
Subjective:    Patient ID: Nicole Hughes, female    DOB: August 31, 1970, 44 y.o.   MRN: [id number]  HPI  Nicole Hughes is a 44 year old female who presents today for follow up of left upper quadrant pain to her left side. Her pain has improved slightly from 10/10 to 7/10. She's been taking the flexeril three times daily and percocet every 6 hours. Her pain is located to let left upper quadrant of her abdomen over her lower ribs and is now moving towards her lateral chest. She experiences severe pain when she bends forward, lays down flat, transitioning from laying to sitting, and exerting pressure to her abdominal cavity such as sneezing and coughing. Denies recent injury or trauma, shortness of breath, nausea, vomiting, bloody stools.  Review of Systems  Constitutional: Negative for fever and chills.  Respiratory: Negative for shortness of breath.   Cardiovascular: Negative for chest pain and palpitations.  Gastrointestinal: Negative for nausea, vomiting, abdominal pain, diarrhea, constipation and blood in stool.  Genitourinary: Negative for dysuria.  Musculoskeletal: Positive for myalgias. Negative for back pain and neck pain.  Skin: Negative for rash.  Neurological: Positive for light-headedness.       Dizziness when pain is present.       Past Medical History  Diagnosis Date  . Dysmenorrhea   . Dyspareunia   . Pelvic pain   . Pulmonary embolism   . Cardiomyopathy   . Cyst of ovary   . History of chicken pox   . History of depression   . History of migraine     and frequent Headaches  . History of hay fever   . History of blood clots   . Complication of anesthesia     "hard to get under"  . 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/24/2012    History   Social History  . Marital Status: Married    Spouse Name: N/A  . Number of Children: N/A  . Years of Education: N/A   Occupational History  . Not on file.   Social History Main Topics  . Smoking status: Never Smoker   .  Smokeless tobacco: Not on file  . Alcohol Use: 0.0 oz/week    0 Standard drinks or equivalent per week     Comment: occ  . Drug Use: No  . Sexual Activity: Not on file   Other Topics Concern  . Not on file   Social History Narrative    Past Surgical History  Procedure Laterality Date  . Cesarean section  1994  . Ectopic pregnancy surgery    . Abdominal exploration surgery    . Tubal ligation  2010  . Laparoscopy N/A 09/24/2012    Procedure: OPERATIVE LAPROSCOPY WITH LYSIS OF ADHESIONS;  Surgeon: Sherron Monday, MD;  Location: [facility];  Service: Gynecology;  Laterality: N/A;    Family History  Problem Relation Age of Onset  . Cancer - Lung Mother   . Heart failure Father   . Hypertension Father     No Known Allergies  Current Outpatient Prescriptions on File Prior to Visit  Medication Sig Dispense Refill  . amLODipine (NORVASC) 5 MG tablet Take 1 tablet (5 mg total) by mouth daily. 30 tablet 5  . hydrochlorothiazide (HYDRODIURIL) 25 MG tablet Take 1 tablet (25 mg total) by mouth daily. 30 tablet 5  . ibuprofen (ADVIL,MOTRIN) 200 MG tablet Take 4 tablets (800 mg total) by mouth every 8 (eight) hours as needed for pain. 45 tablet  1  . lisinopril (PRINIVIL,ZESTRIL) 10 MG tablet Take 1 tablet (10 mg total) by mouth daily. 30 tablet 5  . Nafarelin Acetate (SYNAREL NA) Place 2 puffs into the nose 2 (two) times daily.    . norethindrone (AYGESTIN) 5 MG tablet Take 5 mg by mouth daily.    . sertraline (ZOLOFT) 50 MG tablet Take 50 mg by mouth daily.     No current facility-administered medications on file prior to visit.    BP 116/74 mmHg  Pulse 86  Temp(Src) 98.3 F (36.8 C) (Oral)  Ht 5\' 5"  (1.651 m)  Wt 157 lb (71.215 kg)  BMI 26.13 kg/m2  SpO2 99%  LMP 04/07/2014    Objective:   Physical Exam  Constitutional: She is oriented to person, place, and time.  Appears to be in acute pain. She is clenching the exam table sitting upright.  Neck: Neck supple.    Cardiovascular: Normal rate and regular rhythm.   Pulmonary/Chest: Effort normal and breath sounds normal.    Oxygen saturation is 99% on room air.  Abdominal: Soft. Bowel sounds are normal. There is no tenderness.  Musculoskeletal:  Pain upon palpation, flexion, extension, lateral bending of chest wall.  Lymphadenopathy:    She has no cervical adenopathy.  Neurological: She is alert and oriented to person, place, and time.  Skin: Skin is warm and dry.          Assessment & Plan:

## 2014-05-01 ENCOUNTER — Telehealth: Payer: Self-pay | Admitting: Primary Care

## 2014-05-01 NOTE — Telephone Encounter (Signed)
Please notify Ms. Nicole Hughes that both of her xrays were normal without evidence of fracture. How is she feeling? Any better over the weekend?  Thanks!

## 2014-05-01 NOTE — Telephone Encounter (Signed)
Left voicemail for patient to call back. 

## 2014-05-03 NOTE — Telephone Encounter (Signed)
Left a voice mail for patient to call back.

## 2014-05-05 ENCOUNTER — Encounter: Payer: Self-pay | Admitting: *Deleted

## 2014-05-05 NOTE — Telephone Encounter (Signed)
Left voicemail for patient if not better to call us. Sending a letter with xray results and Kate's comments to patient.

## 2014-05-31 ENCOUNTER — Ambulatory Visit: Payer: 59 | Admitting: Physical Therapy

## 2014-05-31 ENCOUNTER — Ambulatory Visit: Payer: 59

## 2014-05-31 NOTE — Therapy (Signed)
[facility] [address] [place], [place], [address] Phone: [phone]   Fax:  [phone]  Physical Therapy Note  Patient Details  Name: Nicole Hughes MRN: [id number] Date of Birth: 06-Jan-1971 Referring Provider:  Tonye RoyaltyGyarteng-Dakwa, Kwadwo,*  Encounter Date: 05/31/2014    Past Medical History  Diagnosis Date  . Dysmenorrhea   . Dyspareunia   . Pelvic pain   . Pulmonary embolism   . Cardiomyopathy   . Cyst of ovary   . History of chicken pox   . History of depression   . History of migraine     and frequent Headaches  . History of hay fever   . History of blood clots   . Complication of anesthesia     "hard to get under"  . 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/24/2012    Past Surgical History  Procedure Laterality Date  . Cesarean section  1994  . Ectopic pregnancy surgery    . Abdominal exploration surgery    . Tubal ligation  2010  . Laparoscopy N/A 09/24/2012    Procedure: OPERATIVE LAPROSCOPY WITH LYSIS OF ADHESIONS;  Surgeon: Sherron MondayJody [name], MD;  Location: [facility];  Service: Gynecology;  Laterality: N/A;    There were no vitals filed for this visit.  Visit Diagnosis:  Pain in joint, pelvic region and thigh, unspecified laterality      Subjective Assessment - 05/31/14 1108    Subjective Pt is a 44 y/o female who presents to OPPT with hx of endometriosis and 5 year history of pelvic pain.        Pt arrived for initial PT evaluation.  While obtaining history pt c/o mainly of pelvic pain associated with endometriosis.  Feel pt most suited for evaluation by PT specializing in pelvic floor pain.  [facility] with Eulis Fosterheryl Gray, PT for Friday 06/02/14 at 10:15 AM.   No charge for visit today.                                     Problem List Patient Active Problem List   Diagnosis Date Noted  . Abdominal pain, left upper quadrant 04/26/2014   . Abdominal pain, epigastric 01/17/2014  . Acute sinusitis 01/17/2014  . 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 Ingrown toenail 08/24/2012  . Chest wall pain 08/20/2012  . Stress reaction 07/23/2012  . Essential hypertension 06/09/2012  . History of pulmonary embolism 06/09/2012  . Preop cardiovascular exam 06/08/2012  . Dysmenorrhea   . Dyspareunia   . Pelvic pain   . Cardiomyopathy   . Cyst of ovary    Clarita CraneStephanie F Jehu Mccauslin, PT, DPT 05/31/2014 11:28 AM  [facility] [address] [place], [place], [address] Phone: [phone]   Fax:  [phone]

## 2014-06-02 ENCOUNTER — Ambulatory Visit: Payer: 59 | Attending: Anesthesiology | Admitting: Physical Therapy

## 2014-06-02 ENCOUNTER — Encounter: Payer: Self-pay | Admitting: Physical Therapy

## 2014-06-02 DIAGNOSIS — R1030 Lower abdominal pain, unspecified: Secondary | ICD-10-CM

## 2014-06-02 DIAGNOSIS — M545 Low back pain, unspecified: Secondary | ICD-10-CM

## 2014-06-02 DIAGNOSIS — N949 Unspecified condition associated with female genital organs and menstrual cycle: Secondary | ICD-10-CM | POA: Insufficient documentation

## 2014-06-02 DIAGNOSIS — R102 Pelvic and perineal pain: Secondary | ICD-10-CM

## 2014-06-02 NOTE — Therapy (Addendum)
[facility] [address] [place], [place], [address] Phone: [phone]   Fax:  [phone]  Physical Therapy Evaluation  Patient Details  Name: Nicole Hughes MRN: [id number] Date of Birth: August 26, 1970 Referring Provider:  Shanon Ace,*  Encounter Date: 06/02/2014      PT End of Session - 06/02/14 1015    Visit Number 1   Date for PT Re-Evaluation 08/25/14   PT Start Time 9937   PT Stop Time 1115   PT Time Calculation (min) 60 min   Activity Tolerance Patient tolerated treatment well   Behavior During Therapy [facility] for tasks assessed/performed      Past Medical History  Diagnosis Date  . Dysmenorrhea   . Dyspareunia   . Pelvic pain   . Pulmonary embolism   . Cardiomyopathy   . Cyst of ovary   . History of chicken pox   . History of depression   . History of migraine     and frequent Headaches  . History of hay fever   . History of blood clots   . Complication of anesthesia     "hard to get under"  . 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/24/2012    Past Surgical History  Procedure Laterality Date  . Cesarean section  1994  . Ectopic pregnancy surgery    . Abdominal exploration surgery    . Tubal ligation  2010  . Laparoscopy N/A 09/24/2012    Procedure: OPERATIVE LAPROSCOPY WITH LYSIS OF ADHESIONS;  Surgeon: Thornell Sartorius, MD;  Location: [facility];  Service: Gynecology;  Laterality: N/A;    There were no vitals filed for this visit.  Visit Diagnosis:  Bilateral low back pain without sciatica - Plan: PT plan of care cert/re-cert  Lower abdominal pain - Plan: PT plan of care cert/re-cert  Perineal pain in female - Plan: PT plan of care cert/re-cert      Subjective Assessment - 06/02/14 1023    Subjective Patient reports she miscarried 2010, had the left tubal ligation due to one twin in tube. Since then she has had pelvic/lumbar pain. Patient has a cycle 31/2 weeks of the month.   Limitations Sitting;Lifting;Standing;Walking;House hold activities   How long can you sit comfortably? pain all the time   How long can you stand comfortably? standing 15 min.    How long can you walk comfortably? pain the whole time   Patient Stated Goals be able to function   Currently in Pain? Yes   Pain Score 6    Pain Location Pelvis  back, hips, abdomen   Pain Orientation Right;Left;Anterior;Posterior   Pain Descriptors / Indicators Constant;Aching   Pain Type Chronic pain   Pain Onset More than a month ago   Pain Frequency Constant   Aggravating Factors  standing, wakes her up in the night   Pain Relieving Factors heating pad   Multiple Pain Sites No            [facility] PT Assessment - 06/02/14 0001    Assessment   Medical Diagnosis M54.5 Lumbago   Onset Date/Surgical Date 10/21/03   Next MD Visit 06/08/2014   Prior Therapy None   Precautions   Precautions None   Precaution Comments Patient is unable to lay on her stomach due to pain   Balance Screen   Has the patient fallen in the past 6 months No   Has the patient had a decrease in activity level because of a fear of falling?  No   Is the patient reluctant to leave their home because of a fear of falling?  No   Prior Function   Level of Independence Independent with basic ADLs   Observation/Other Assessments   Skin Integrity mottled appearance on back due to hot packs   Focus on Therapeutic Outcomes (FOTO)  60% limitation   Posture/Postural Control   Posture/Postural Control Postural limitations   Postural Limitations Rounded Shoulders;Forward head;Decreased lumbar lordosis;Flexed trunk   ROM / Strength   AROM / PROM / Strength --  bil. hip flexion 110 degrees   AROM   Lumbar Flexion decreased by 25%  painful   Lumbar Extension decreased by 75%  pain   Lumbar - Right Side Bend decreased by 25%  pain   Lumbar - Left Side Bend decreased by 25%  pain   Lumbar - Right Rotation decreased by 25%   pain   Lumbar  - Left Rotation decreased by 25%  pain   Strength   Overall Strength Comments abdominal strength 2/5   Strength Assessment Site --  bil. hip strength 3+/5   Palpation   Palpation comment pin point tenderness located in right lower quadrant, right diaphragm, ; General tenderness located in bil. SI joint, bil. gluteal, bil. piriformis, bil. obturator internist , bil. levator ani.   muscle knot on left lumbar paraspinals   Bed Mobility   Bed Mobility Rolling Right;Rolling Left;Right Sidelying to Sit;Left Sidelying to Sit   Rolling Right 6: Modified independent (Device/Increase time)   Rolling Left 6: Modified independent (Device/Increase time)   Right Sidelying to Sit 6: Modified independent (Device/Increase time)   Left Sidelying to Sit 6: Modified independent (Device/Increase time)   Balance   Balance Assessed --  single leg stance bil. max. body sway, hold 5 sec.                 Pelvic Floor Special Questions - 06/02/14 0001    Prior Pregnancies Yes   Number of Pregnancies 3   Number of C-Sections 2   Any difficulty with labor and deliveries Yes   Diastasis Recti no   Urinary Leakage No   Fecal incontinence No   Falling out feeling (prolapse) Yes          [facility] Adult PT Treatment/Exercise - 06/02/14 0001    Modalities   Modalities Electrical Stimulation;Moist Heat   Moist Heat Therapy   Number Minutes Moist Heat 15 Minutes   Moist Heat Location --  abdominal in sitting   Electrical Stimulation   Electrical Stimulation Location abdominal/sacral   Electrical Stimulation Action IFC   Electrical Stimulation Parameters 80-150hz   Electrical Stimulation Goals Pain                PT Education - 06/02/14 1206    Education provided Yes   Education Details diaphragmatic breathing to reduce muscle tension   Person(s) Educated Patient   Methods Explanation;Demonstration   Comprehension Verbalized understanding;Returned demonstration          PT Short  Term Goals - 06/02/14 1215    PT SHORT TERM GOAL #1   Title understand how to perform diaphragmatic breathing to reduce pain and muscle tension   Time 4   Period Weeks   Status New   PT SHORT TERM GOAL #2   Title ability to understand correct posture to reduce tension on the pelvic floor muscle   Time 4   Period Weeks   Status New   PT SHORT TERM  GOAL #3   Title understand ways to conserve energy so patient can tolerate daily tasks   Time 4   Period Weeks   Status New           PT Long Term Goals - 06/02/14 1216    PT LONG TERM GOAL #1   Title abilty to have intercourse with her husband due to improved muscle mobility in the perineal muscles and using diaphramatic breathing   Time 12   Period Weeks   Status New   PT LONG TERM GOAL #2   Title understand correct body mechanics with daily tasks to decreased strain on lumbar region.    Time 12   Period Weeks   Status New   PT LONG TERM GOAL #3   Title understand ways to manage pain with home TENS unit or other device   Time 12   Period Weeks   Status New   PT LONG TERM GOAL #4   Title ability to move in bed with >/= 50% ease due to improved trunk and hip mobility   Time 12   Period Weeks   Status New               Plan - 06/02/14 1208    Clinical Impression Statement Patient is a 44 year old female with diagnosis of lumbago.  Patient presents with chronic pain located in pelvic area, abdominal, bil. hips and lumbar area.  Patient lumbar ROM decreased by 75% all motions.  Patient has a chronic pain posture with forward head, rounded shoulders, reduced lumbar lordosis and flexed posture.  Patient has palpable tenderness located in abdominal, pelvis, lumbar, and bilateral hip region. Patient has decreased mobility with bed mobility due to pain.  Patient is unable to lay on her stomach due to pain. Patient has weakness in abdominal muscles and hips.. Patient has difficulty with all activities due to pain.. Patient is  unable to have intercourse with her husband due to pain. Patient FOTO score is 60% limitation. Patient would benefit with physical therpay for pain management, improving her mobility, have her return to daily acitivites with energy conservation.     Pt will benefit from skilled therapeutic intervention in order to improve on the following deficits Decreased range of motion;Difficulty walking;Increased fascial restricitons;Increased muscle spasms;Decreased endurance;Decreased activity tolerance;Pain;Impaired flexibility;Decreased balance;Decreased mobility;Decreased strength;Postural dysfunction   Rehab Potential Good   PT Frequency 1x / week   PT Duration 12 weeks   PT Treatment/Interventions ADLs/Self Care Home Management;Biofeedback;Electrical Stimulation;Moist Heat;Ultrasound;Neuromuscular re-education;Balance training;Therapeutic exercise;Therapeutic activities;Functional mobility training;Patient/family education;Manual techniques;Energy conservation;Passive range of motion;Other (comment)  Home TENS unit   PT Next Visit Plan soft tissue work, flexibility exercises   PT Home Exercise Plan flexibility exercises   Recommended Other Services Home TENS unit   Consulted and Agree with Plan of Care Patient         Problem List Patient Active Problem List   Diagnosis Date Noted  . Abdominal pain, left upper quadrant 04/26/2014  . Abdominal pain, epigastric 01/17/2014  . Acute sinusitis 01/17/2014  . 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 Ingrown toenail 08/24/2012  . Chest wall pain 08/20/2012  . Stress reaction 07/23/2012  . Essential hypertension 06/09/2012  . History of pulmonary embolism 06/09/2012  . Preop cardiovascular exam 06/08/2012  . Dysmenorrhea   . Dyspareunia   . Pelvic pain   . Cardiomyopathy   . Cyst of ovary     GRAY,CHERYL,PT 06/02/2014, 12:21 PM  [facility]  [address], [place],  [place], [address] Phone: [phone]   Fax:  [phone]    PHYSICAL THERAPY DISCHARGE SUMMARY  Visits from Start of Care: 1 Current functional level related to goals / functional outcomes: Unable to assess patient due to her only attending the initial evaluation and not returning since then.   Remaining deficits: Unable to assess due to patient not returning.    Education / Equipment: None  Plan:                                                    Patient goals were not met. Patient is being discharged due to not returning since the last visit. Thank you for the referral. Earlie Counts, PT 08/07/2014 10:54 AM   ?????

## 2014-06-22 ENCOUNTER — Emergency Department (HOSPITAL_COMMUNITY)
Admission: EM | Admit: 2014-06-22 | Discharge: 2014-06-22 | Disposition: A | Discharge status: Home / Self Care | Payer: 59 | Attending: Emergency Medicine | Admitting: Emergency Medicine

## 2014-06-22 ENCOUNTER — Emergency Department (HOSPITAL_COMMUNITY): Payer: 59

## 2014-06-22 ENCOUNTER — Encounter (HOSPITAL_COMMUNITY): Payer: Self-pay | Admitting: General Practice

## 2014-06-22 DIAGNOSIS — R1030 Lower abdominal pain, unspecified: Secondary | ICD-10-CM | POA: Diagnosis not present

## 2014-06-22 DIAGNOSIS — R0789 Other chest pain: Secondary | ICD-10-CM | POA: Diagnosis not present

## 2014-06-22 DIAGNOSIS — F329 Major depressive disorder, single episode, unspecified: Secondary | ICD-10-CM | POA: Insufficient documentation

## 2014-06-22 DIAGNOSIS — Z8742 Personal history of other diseases of the female genital tract: Secondary | ICD-10-CM | POA: Diagnosis not present

## 2014-06-22 DIAGNOSIS — R000 Tachycardia, unspecified: Secondary | ICD-10-CM | POA: Insufficient documentation

## 2014-06-22 DIAGNOSIS — R197 Diarrhea, unspecified: Secondary | ICD-10-CM | POA: Insufficient documentation

## 2014-06-22 DIAGNOSIS — R112 Nausea with vomiting, unspecified: Secondary | ICD-10-CM

## 2014-06-22 DIAGNOSIS — I10 Essential (primary) hypertension: Secondary | ICD-10-CM | POA: Diagnosis not present

## 2014-06-22 DIAGNOSIS — G43909 Migraine, unspecified, not intractable, without status migrainosus: Secondary | ICD-10-CM | POA: Insufficient documentation

## 2014-06-22 DIAGNOSIS — M549 Dorsalgia, unspecified: Secondary | ICD-10-CM | POA: Insufficient documentation

## 2014-06-22 DIAGNOSIS — Z79899 Other long term (current) drug therapy: Secondary | ICD-10-CM | POA: Insufficient documentation

## 2014-06-22 DIAGNOSIS — F419 Anxiety disorder, unspecified: Secondary | ICD-10-CM | POA: Insufficient documentation

## 2014-06-22 DIAGNOSIS — Z86711 Personal history of pulmonary embolism: Secondary | ICD-10-CM | POA: Insufficient documentation

## 2014-06-22 HISTORY — DX: Essential (primary) hypertension: I10

## 2014-06-22 LAB — COMPREHENSIVE METABOLIC PANEL
ALT: 60 U/L — ABNORMAL HIGH (ref 14–54)
AST: 62 U/L — ABNORMAL HIGH (ref 15–41)
Albumin: 4.8 g/dL (ref 3.5–5.0)
Alkaline Phosphatase: 40 U/L (ref 38–126)
Anion gap: 15 (ref 5–15)
BUN: 10 mg/dL (ref 6–20)
CO2: 17 mmol/L — ABNORMAL LOW (ref 22–32)
Calcium: 9.8 mg/dL (ref 8.9–10.3)
Chloride: 105 mmol/L (ref 101–111)
Creatinine, Ser: 0.75 mg/dL (ref 0.44–1.00)
GFR calc Af Amer: >60 mL/min (ref >60)
GFR calc non Af Amer: >60 mL/min (ref >60)
Glucose, Bld: 88 mg/dL (ref 65–99)
Potassium: 4 mmol/L (ref 3.5–5.1)
Sodium: 137 mmol/L (ref 135–145)
Total Bilirubin: 0.9 mg/dL (ref 0.3–1.2)
Total Protein: 7.3 g/dL (ref 6.5–8.1)

## 2014-06-22 LAB — CBC WITH DIFFERENTIAL/PLATELET
Basophils Absolute: 0.1 10*3/uL (ref 0.0–0.1)
Basophils Relative: 1 % (ref 0–1)
Eosinophils Absolute: 0 10*3/uL (ref 0.0–0.7)
Eosinophils Relative: 1 % (ref 0–5)
HCT: 41.8 % (ref 36.0–46.0)
Hemoglobin: 14.9 g/dL (ref 12.0–15.0)
Lymphocytes Relative: 24 % (ref 12–46)
Lymphs Abs: 1.6 10*3/uL (ref 0.7–4.0)
MCH: 32.5 pg (ref 26.0–34.0)
MCHC: 35.6 g/dL (ref 30.0–36.0)
MCV: 91.1 fL (ref 78.0–100.0)
Monocytes Absolute: 0.5 10*3/uL (ref 0.1–1.0)
Monocytes Relative: 8 % (ref 3–12)
Neutro Abs: 4.4 10*3/uL (ref 1.7–7.7)
Neutrophils Relative %: 66 % (ref 43–77)
Platelets: 225 10*3/uL (ref 150–400)
RBC: 4.59 MIL/uL (ref 3.87–5.11)
RDW: 12.1 % (ref 11.5–15.5)
WBC: 6.5 10*3/uL (ref 4.0–10.5)

## 2014-06-22 LAB — URINALYSIS, ROUTINE W REFLEX MICROSCOPIC
Bilirubin Urine: NEGATIVE
Glucose, UA: NEGATIVE mg/dL
Hgb urine dipstick: NEGATIVE
Ketones, ur: 40 mg/dL — AB
Leukocytes, UA: NEGATIVE
Nitrite: NEGATIVE
Protein, ur: NEGATIVE mg/dL
Specific Gravity, Urine: 1.008 (ref 1.005–1.030)
Urobilinogen, UA: 0.2 mg/dL (ref 0.0–1.0)
pH: 6 (ref 5.0–8.0)

## 2014-06-22 LAB — I-STAT TROPONIN, ED: Troponin i, poc: 0 ng/mL (ref 0.00–0.08)

## 2014-06-22 LAB — LIPASE, BLOOD: Lipase: 42 U/L (ref 22–51)

## 2014-06-22 LAB — D-DIMER, QUANTITATIVE: D-Dimer, Quant: 0.47 ug/mL-FEU (ref 0.00–0.48)

## 2014-06-22 LAB — POC URINE PREG, ED: Preg Test, Ur: NEGATIVE

## 2014-06-22 MED ORDER — MORPHINE SULFATE 4 MG/ML IJ SOLN
4.0000 mg | Freq: Once | INTRAMUSCULAR | Status: AC
  Administered 2014-06-22: 4 mg via INTRAVENOUS
  Filled 2014-06-22: qty 1

## 2014-06-22 MED ORDER — HYDROCODONE-ACETAMINOPHEN 5-325 MG PO TABS: 1.0000 | ORAL_TABLET | ORAL | Status: DC | PRN

## 2014-06-22 MED ORDER — SODIUM CHLORIDE 0.9 % IV BOLUS (SEPSIS)
1000.0000 mL | Freq: Once | INTRAVENOUS | Status: AC
  Administered 2014-06-22: 1000 mL via INTRAVENOUS

## 2014-06-22 MED ORDER — LISINOPRIL 10 MG PO TABS
10.0000 mg | ORAL_TABLET | Freq: Once | ORAL | Status: AC
  Administered 2014-06-22: 10 mg via ORAL
  Filled 2014-06-22: qty 1

## 2014-06-22 MED ORDER — PROMETHAZINE HCL 25 MG/ML IJ SOLN
25.0000 mg | Freq: Once | INTRAMUSCULAR | Status: AC
  Administered 2014-06-22: 25 mg via INTRAVENOUS
  Filled 2014-06-22: qty 1

## 2014-06-22 MED ORDER — HYDROCHLOROTHIAZIDE 25 MG PO TABS
25.0000 mg | ORAL_TABLET | Freq: Every day | ORAL | Status: DC
  Administered 2014-06-22: 25 mg via ORAL
  Filled 2014-06-22: qty 1

## 2014-06-22 MED ORDER — ACETAMINOPHEN 325 MG PO TABS
650.0000 mg | ORAL_TABLET | Freq: Once | ORAL | Status: AC
  Administered 2014-06-22: 650 mg via ORAL
  Filled 2014-06-22: qty 2

## 2014-06-22 MED ORDER — ONDANSETRON HCL 4 MG/2ML IJ SOLN
4.0000 mg | Freq: Once | INTRAMUSCULAR | Status: AC
  Administered 2014-06-22: 4 mg via INTRAVENOUS
  Filled 2014-06-22: qty 2

## 2014-06-22 MED ORDER — ONDANSETRON HCL 4 MG/2ML IJ SOLN: 4.0000 mg | Freq: Once | INTRAMUSCULAR | Status: DC

## 2014-06-22 MED ORDER — ONDANSETRON 4 MG PO TBDP: 4.0000 mg | ORAL_TABLET | Freq: Three times a day (TID) | ORAL | Status: DC | PRN

## 2014-06-22 MED ORDER — DICYCLOMINE HCL 10 MG PO CAPS
10.0000 mg | ORAL_CAPSULE | Freq: Once | ORAL | Status: AC
  Administered 2014-06-22: 10 mg via ORAL
  Filled 2014-06-22: qty 1

## 2014-06-22 MED ORDER — DICYCLOMINE HCL 20 MG PO TABS: 20.0000 mg | ORAL_TABLET | Freq: Two times a day (BID) | ORAL | Status: DC

## 2014-06-22 MED ORDER — MORPHINE SULFATE 2 MG/ML IJ SOLN
2.0000 mg | Freq: Once | INTRAMUSCULAR | Status: AC
  Administered 2014-06-22: 2 mg via INTRAVENOUS
  Filled 2014-06-22: qty 1

## 2014-06-22 NOTE — ED Notes (Signed)
Pt brought in via private vehicle with complaints of abdominal pain, back pain, SOB, and chest tightness that started last night. Pt was unable to sleep due to the severity of pain. Pt is A/O. Pt reports having endometriosis and daily abdominal pain. Pt reports this pain is different. Pt also reporting N/V/D for the past 3 days.

## 2014-06-22 NOTE — ED Provider Notes (Signed)
CSN: [id number]     Arrival date & time 06/22/14  7253 History   First MD Initiated Contact with Patient 06/22/14 [phone]     Chief Complaint  Patient presents with  . Abdominal Pain   Nicole Hughes is a 44 y.o. female with a history of endometriosis, pelvic pain, and pulmonary embolism who presents to the ED complaining of worsening lower abdominal pain from her endometriosis, diarrhea, nausea, vomiting, low back pain, left sided chest tightness with shortness of breath. The patient reports she has chronic low abdominal pain from endometriosis that worsened last night. She currently rates her pain at 10/10 and sharp in her low abdomen that is into her low back. Patient also complains of left-sided chest tightness and the feeling that something is stuck deep into her throat and her chest. She reports this began this morning after taking Advil. She reports vomiting 4 times today. She reports shortness of breath became last night. She reports a PE in 2000 after her pregnancy and reports this feels different from her last PE. She denies any endogenous estrogen use. Patient is on Synarel for endometriosis. Patient also complains of urinary frequency for the past 2 days but denies other urinary symptoms. Patient reports taking Advil p.m. this morning and vomited after taking them. Patient reports having diarrhea for the past 3 days and has had 3 loose stools today. She denies recent antibiotic use. Her last menstrual cycle started on 06/14/14, and stopepd a few days ago. The patient denies fevers, chills, chest pain, palpitations, recent surgeries, recent travel, dysuria, hematuria, vaginal discharge, vaginal bleeding, or rashes.   (Consider location/radiation/quality/duration/timing/severity/associated sxs/prior Treatment) HPI  Past Medical History  Diagnosis Date  . Dysmenorrhea   . Dyspareunia   . Pelvic pain   . Pulmonary embolism   . Cardiomyopathy   . Cyst of ovary   . History of chicken pox   .  History of depression   . History of migraine     and frequent Headaches  . History of hay fever   . History of blood clots   . Complication of anesthesia     "hard to get under"  . 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 Hypertension    Past Surgical History  Procedure Laterality Date  . Cesarean section  1994  . Ectopic pregnancy surgery    . Abdominal exploration surgery    . Tubal ligation  2010  . Laparoscopy N/A 09/24/2012    Procedure: OPERATIVE LAPROSCOPY WITH LYSIS OF ADHESIONS;  Surgeon: Sherron Monday, MD;  Location: [facility];  Service: Gynecology;  Laterality: N/A;   Family History  Problem Relation Age of Onset  . Cancer - Lung Mother   . Heart failure Father   . Hypertension Father    History  Substance Use Topics  . Smoking status: Never Smoker   . Smokeless tobacco: Not on file  . Alcohol Use: 0.0 oz/week    0 Standard drinks or equivalent per week     Comment: occ   OB History    No data available     Review of Systems  Constitutional: Negative for fever and chills.  HENT: Negative for congestion and sore throat.   Eyes: Negative for visual disturbance.  Respiratory: Positive for chest tightness and shortness of breath. Negative for cough and wheezing.   Cardiovascular: Negative for chest pain, palpitations and leg swelling.  Gastrointestinal: Positive for nausea, vomiting, abdominal pain and diarrhea.  Genitourinary: Positive for frequency and pelvic  pain. Negative for dysuria, urgency, hematuria, flank pain, decreased urine volume, vaginal bleeding, vaginal discharge and difficulty urinating.  Musculoskeletal: Positive for back pain. Negative for neck pain.  Skin: Negative for rash.  Neurological: Negative for light-headedness and headaches.      Allergies  Review of patient's allergies indicates no known allergies.  Home Medications   Prior to Admission medications   Medication Sig Start Date End Date Taking? Authorizing Provider   amLODipine (NORVASC) 5 MG tablet Take 1 tablet (5 mg total) by mouth daily. 01/17/14  Yes Judy Pimple, MD  hydrochlorothiazide (HYDRODIURIL) 25 MG tablet Take 1 tablet (25 mg total) by mouth daily. 01/17/14  Yes Judy Pimple, MD  ibuprofen (ADVIL,MOTRIN) 200 MG tablet Take 4 tablets (800 mg total) by mouth every 8 (eight) hours as needed for pain. 09/24/12  Yes Jody [name]-Stuckert, MD  lisinopril (PRINIVIL,ZESTRIL) 10 MG tablet Take 1 tablet (10 mg total) by mouth daily. 01/17/14  Yes Judy Pimple, MD  Nafarelin Acetate 2 MG/ML SOLN Place 2 mg into the nose daily.   Yes Historical Provider, MD  norethindrone (AYGESTIN) 5 MG tablet Take 5 mg by mouth daily.   Yes Historical Provider, MD  omeprazole (PRILOSEC) 20 MG capsule Take 1 capsule (20 mg total) by mouth daily. Patient taking differently: Take 20 mg by mouth daily as needed (acid reflux).  04/28/14  Yes Doreene Nest, NP  oxyCODONE-acetaminophen (ROXICET) 5-325 MG per tablet Take 1-2 tablets by mouth every 8 (eight) hours as needed for severe pain. 04/28/14  Yes Doreene Nest, NP  sertraline (ZOLOFT) 100 MG tablet Take 100 mg by mouth daily.  05/31/14  Yes Historical Provider, MD  zolpidem (AMBIEN) 5 MG tablet Take 5 mg by mouth at bedtime as needed for sleep.  05/26/14  Yes Historical Provider, MD  cyclobenzaprine (FLEXERIL) 5 MG tablet Take 1 tablet (5 mg total) by mouth 3 (three) times daily as needed for muscle spasms. Patient not taking: Reported on 05/31/2014 04/28/14   Doreene Nest, NP  dicyclomine (BENTYL) 20 MG tablet Take 1 tablet (20 mg total) by mouth 2 (two) times daily. 06/22/14   Everlene Farrier, PA-C  HYDROcodone-acetaminophen (NORCO/VICODIN) 5-325 MG per tablet Take 1 tablet by mouth every 4 (four) hours as needed. 06/22/14   Everlene Farrier, PA-C  ondansetron (ZOFRAN ODT) 4 MG disintegrating tablet Take 1 tablet (4 mg total) by mouth every 8 (eight) hours as needed for nausea or vomiting. 06/22/14   Everlene Farrier, PA-C    BP 200/113 mmHg  Pulse 106  Temp(Src) 98.5 F (36.9 C) (Oral)  Resp 21  Ht  (1.651 m)  Wt 144 lb (65.318 kg)  BMI 23.96 kg/m2  SpO2 100%  LMP 06/14/2014 Physical Exam  Constitutional: She is oriented to person, place, and time. She appears well-developed and well-nourished. No distress.  Nontoxic appearing.  HENT:  Head: Normocephalic and atraumatic.  Mouth/Throat: Oropharynx is clear and moist. No oropharyngeal exudate.  Eyes: Conjunctivae are normal. Pupils are equal, round, and reactive to light. Right eye exhibits no discharge. Left eye exhibits no discharge.  Neck: Neck supple. No JVD present.  Cardiovascular: Regular rhythm, normal heart sounds and intact distal pulses.  Exam reveals no gallop and no friction rub.   No murmur heard. Mildly tachycardic with a heart rate of 112.  Pulmonary/Chest: Effort normal and breath sounds normal. No respiratory distress. She has no wheezes. She has no rales. She exhibits no tenderness.  Lungs are clear  to auscultation bilaterally. No chest tenderness.  Abdominal: Soft. Bowel sounds are normal. She exhibits no distension and no mass. There is tenderness. There is no rebound and no guarding.  Abdomen is soft. Bowel sounds are present. Patient has bilateral lower abdominal and suprapubic tenderness to palpation. Negative psoas and obturator sign. No rebound tenderness.  Musculoskeletal: She exhibits no edema or tenderness.  No lower extremity edema or tenderness.  Lymphadenopathy:    She has no cervical adenopathy.  Neurological: She is alert and oriented to person, place, and time. Coordination normal.  Skin: Skin is warm and dry. No rash noted. She is not diaphoretic. No pallor.  Erythema to the patient's low back from what appears to be a heating pad. Patient reports she has burned herself using heating pad  Psychiatric: Her behavior is normal. Her mood appears anxious.  Patient appears anxious.   Nursing note and vitals  reviewed.   ED Course  Procedures (including critical care time) Labs Review Labs Reviewed  COMPREHENSIVE METABOLIC PANEL - Abnormal; Notable for the following:    CO2 17 (*)    AST 62 (*)    ALT 60 (*)    All other components within normal limits  URINALYSIS, ROUTINE W REFLEX MICROSCOPIC (NOT AT [facility]) - Abnormal; Notable for the following:    Ketones, ur 40 (*)    All other components within normal limits  LIPASE, BLOOD  CBC WITH DIFFERENTIAL/PLATELET  D-DIMER, QUANTITATIVE (NOT AT [facility])  POC URINE PREG, ED  Rosezena Sensor, ED    Imaging Review Dg Chest 2 View  06/22/2014   CLINICAL DATA:  Chest tightness and shortness of breath for 1 day. History of endometriosis.  EXAM: CHEST  2 VIEW  COMPARISON:  April 28, 2014  FINDINGS: Lungs are clear. Heart size and pulmonary vascularity are normal. No adenopathy. No pneumothorax. No bone lesions.  IMPRESSION: No edema or consolidation.  No apparent pneumothorax.   Electronically Signed   By: Bretta Bang III M.D.   On: 06/22/2014 11:13     EKG Interpretation   Date/Time:  Thursday June 22 2014 10:03:57 EDT Ventricular Rate:  114 PR Interval:  156 QRS Duration: 75 QT Interval:  326 QTC Calculation: 449 R Axis:   47 Text Interpretation:  Sinus tachycardia Low voltage, precordial leads  Baseline wander in lead(s) II III aVL aVF V3 Confirmed by ZACKOWSKI  MD,  SCOTT ([id number]) on 06/22/2014 10:08:22 AM      Filed Vitals:   06/22/14 1615 06/22/14 1617 06/22/14 1623 06/22/14 1630  BP: 195/110 204/108 188/100 200/113  Pulse: 91 96  106  Temp: 98.5 F (36.9 C)     TempSrc: Oral     Resp: 16 18  21   Height:      Weight:      SpO2: 99% 98%  100%     MDM   Meds given in ED:  Medications  hydrochlorothiazide (HYDRODIURIL) tablet 25 mg (25 mg Oral Given 06/22/14 1646)  sodium chloride 0.9 % bolus 1,000 mL (0 mLs Intravenous Stopped 06/22/14 1236)  ondansetron (ZOFRAN) injection 4 mg (4 mg Intravenous Given 06/22/14 1030)   morphine 4 MG/ML injection 4 mg (4 mg Intravenous Given 06/22/14 1030)  acetaminophen (TYLENOL) tablet 650 mg (650 mg Oral Given 06/22/14 1037)  sodium chloride 0.9 % bolus 1,000 mL (0 mLs Intravenous Stopped 06/22/14 1610)  ondansetron (ZOFRAN) injection 4 mg (4 mg Intravenous Given 06/22/14 1507)  morphine 2 MG/ML injection 2 mg (2 mg Intravenous Given 06/22/14  1507)  lisinopril (PRINIVIL,ZESTRIL) tablet 10 mg (10 mg Oral Given 06/22/14 1646)  promethazine (PHENERGAN) injection 25 mg (25 mg Intravenous Given 06/22/14 1646)  dicyclomine (BENTYL) capsule 10 mg (10 mg Oral Given 06/22/14 1646)    Discharge Medication List as of 06/22/2014  4:55 PM    START taking these medications   Details  dicyclomine (BENTYL) 20 MG tablet Take 1 tablet (20 mg total) by mouth 2 (two) times daily., Starting 06/22/2014, Until Discontinued, Print    HYDROcodone-acetaminophen (NORCO/VICODIN) 5-325 MG per tablet Take 1 tablet by mouth every 4 (four) hours as needed., Starting 06/22/2014, Until Discontinued, Print    ondansetron (ZOFRAN ODT) 4 MG disintegrating tablet Take 1 tablet (4 mg total) by mouth every 8 (eight) hours as needed for nausea or vomiting., Starting 06/22/2014, Until Discontinued, Print        Final diagnoses:  Nausea vomiting and diarrhea  Lower abdominal pain  Feeling of chest tightness   This a 44 y.o. female with a history of endometriosis, pelvic pain, and pulmonary embolism who presents to the ED complaining of worsening lower abdominal pain from her endometriosis, diarrhea, nausea, vomiting, low back pain, left sided chest tightness with shortness of breath. The patient reports she has chronic low abdominal pain from endometriosis that worsened last night. She currently rates her pain at 10/10 and sharp in her low abdomen that is into her low back. Patient also complains of left-sided chest tightness and the feeling that something is stuck deep into her throat and her chest. She reports this  began this morning after taking Advil. She reports vomiting 4 times today. She reports shortness of breath became last night.   On exam patient is afebrile and nontoxic appearing. Patient appears anxious. She is really tachycardic with a heart rate of 112. Her abdomen is soft with mild bilateral lower abdominal tenderness. No peritoneal signs. Her lungs are clear to auscultation bilaterally. EKG shows sinus tachycardia. Her troponin is negative. She is a negative urine pregnancy test. Urinalysis is negative for infection. CMP is remarkable only for mildly elevated liver enzymes with an AST is 62 and ALT of 60. She reports she drank alcohol last night. Her lipase is normal. CBC is within normal limits. A d-dimer was checked due to the patient's shortness of breath and history of PE. The d-dimer was negative. Chest x-ray was unremarkable. Patient received 2 L fluid bolus, Bentyl, Zofran and morphine and patient reported much improvement of her pain. Patient's blood pressure has been elevated and she reports she was unable to take her morning blood pressure medicines. We'll provide the patient with her blood pressure medicines here and discharge. The patient was able to tolerate by mouth water and her blood pressure medicines prior to discharge. The patient reports her pain is down to a 4 out of 10 in her abdomen and reports this is about her chronic level and feels comfortable being discharged. We'll discharge with prescriptions for Bentyl, Norco, and Zofran. Patient reports she has follow-up with her primary care provider and pain management provider next week. I advised her to keep these appointments. Strict return precautions provided. I advised the patient to follow-up with their primary care provider this week. I advised the patient to return to the emergency department with new or worsening symptoms or new concerns. The patient verbalized understanding and agreement with plan.    This patient was discussed  with Dr. Deretha Emory who agrees with assessment and plan.    Everlene Farrier, PA-C  06/22/14 1808  Vanetta Mulders, MD 06/23/14 [phone]

## 2014-06-22 NOTE — Discharge Instructions (Signed)

## 2015-04-03 ENCOUNTER — Ambulatory Visit: Payer: 59 | Admitting: Family Medicine

## 2015-04-09 ENCOUNTER — Encounter: Payer: Self-pay | Admitting: Family Medicine

## 2015-04-09 ENCOUNTER — Ambulatory Visit (INDEPENDENT_AMBULATORY_CARE_PROVIDER_SITE_OTHER): Payer: 59 | Admitting: Family Medicine

## 2015-04-09 VITALS — BP 160/90 | HR 87 | Temp 98.4°F | Ht 65.0 in | Wt 162.2 lb

## 2015-04-09 DIAGNOSIS — R1012 Left upper quadrant pain: Secondary | ICD-10-CM | POA: Diagnosis not present

## 2015-04-09 DIAGNOSIS — R1013 Epigastric pain: Secondary | ICD-10-CM | POA: Diagnosis not present

## 2015-04-09 DIAGNOSIS — I10 Essential (primary) hypertension: Secondary | ICD-10-CM

## 2015-04-09 DIAGNOSIS — N803 Endometriosis of pelvic peritoneum, unspecified: Secondary | ICD-10-CM

## 2015-04-09 DIAGNOSIS — K219 Gastro-esophageal reflux disease without esophagitis: Secondary | ICD-10-CM

## 2015-04-09 LAB — COMPREHENSIVE METABOLIC PANEL
ALT: 170 U/L — ABNORMAL HIGH (ref 0–35)
AST: 109 U/L — ABNORMAL HIGH (ref 0–37)
Albumin: 4.7 g/dL (ref 3.5–5.2)
Alkaline Phosphatase: 41 U/L (ref 39–117)
BUN: 14 mg/dL (ref 6–23)
CO2: 25 mEq/L (ref 19–32)
Calcium: 10 mg/dL (ref 8.4–10.5)
Chloride: 106 mEq/L (ref 96–112)
Creatinine, Ser: 0.7 mg/dL (ref 0.40–1.20)
GFR: 97.06 mL/min (ref >60.00)
Glucose, Bld: 93 mg/dL (ref 70–99)
Potassium: 4.7 mEq/L (ref 3.5–5.1)
Sodium: 141 mEq/L (ref 135–145)
Total Bilirubin: 0.3 mg/dL (ref 0.2–1.2)
Total Protein: 7.3 g/dL (ref 6.0–8.3)

## 2015-04-09 LAB — LIPID PANEL
Cholesterol: 199 mg/dL (ref 0–200)
HDL: 68.9 mg/dL (ref >39.00)
LDL Cholesterol: 111 mg/dL — ABNORMAL HIGH (ref 0–99)
NonHDL: 130.01
Total CHOL/HDL Ratio: 3
Triglycerides: 95 mg/dL (ref 0.0–149.0)
VLDL: 19 mg/dL (ref 0.0–40.0)

## 2015-04-09 LAB — CBC WITH DIFFERENTIAL/PLATELET
Basophils Absolute: 0 10*3/uL (ref 0.0–0.1)
Basophils Relative: 0.7 % (ref 0.0–3.0)
Eosinophils Absolute: 0.4 10*3/uL (ref 0.0–0.7)
Eosinophils Relative: 7.3 % — ABNORMAL HIGH (ref 0.0–5.0)
HCT: 40.1 % (ref 36.0–46.0)
Hemoglobin: 13.8 g/dL (ref 12.0–15.0)
Lymphocytes Relative: 34.3 % (ref 12.0–46.0)
Lymphs Abs: 1.7 10*3/uL (ref 0.7–4.0)
MCHC: 34.5 g/dL (ref 30.0–36.0)
MCV: 92.9 fl (ref 78.0–100.0)
Monocytes Absolute: 0.4 10*3/uL (ref 0.1–1.0)
Monocytes Relative: 8.3 % (ref 3.0–12.0)
Neutro Abs: 2.5 10*3/uL (ref 1.4–7.7)
Neutrophils Relative %: 49.4 % (ref 43.0–77.0)
Platelets: 216 10*3/uL (ref 150.0–400.0)
RBC: 4.31 Mil/uL (ref 3.87–5.11)
RDW: 12.1 % (ref 11.5–15.5)
WBC: 5 10*3/uL (ref 4.0–10.5)

## 2015-04-09 LAB — TSH: TSH: 0.54 u[IU]/mL (ref 0.35–4.50)

## 2015-04-09 MED ORDER — HYDROCHLOROTHIAZIDE 25 MG PO TABS: 25.0000 mg | ORAL_TABLET | Freq: Every day | ORAL | Status: DC

## 2015-04-09 MED ORDER — AMLODIPINE BESYLATE 5 MG PO TABS: 5.0000 mg | ORAL_TABLET | Freq: Every day | ORAL | Status: DC

## 2015-04-09 MED ORDER — OMEPRAZOLE 20 MG PO CPDR: 20.0000 mg | DELAYED_RELEASE_CAPSULE | Freq: Every day | ORAL | Status: DC

## 2015-04-09 MED ORDER — LISINOPRIL 10 MG PO TABS: 10.0000 mg | ORAL_TABLET | Freq: Every day | ORAL | Status: DC

## 2015-04-09 NOTE — Patient Instructions (Signed)
Labs today Get back on your blood pressure medicines  If after 2 weeks BP is still high- notify me  Otherwise follow up in about 2 months

## 2015-04-09 NOTE — Progress Notes (Signed)
Subjective:    Patient ID: Nicole Hughes, female    DOB: 1970/04/03, 45 y.o.   MRN: [id number]  HPI Here for f/u of chronic health problems   Has had a lot going on - planning for hysterectomy when she can afford it  Father had bowel surgery and also a heart pump  Doing better  Things are going to get better for her   Wt is up significantly  bmi of 27  Ran out of bp meds BP Readings from Last 3 Encounters:  04/09/15 160/90  06/22/14 200/113  04/28/14 116/74    Due for labs   When she was on the bp med - it was still high - 150s or more systolic  Stress and endometriosis pain also played a role   HCTZ 25 Lisinopril 10 Amlodipine 5  She is constantly nauseated -? Why Gyn gave her zofran   On norco - prn from gyn   Is out of prilosec- that may be why she is nauseated Also has heartburn  Tries to minimize ibuprofen  Patient Active Problem List   Diagnosis Date Noted  . GERD (gastroesophageal reflux disease) 04/09/2015  . Abdominal pain, epigastric 01/17/2014  . 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 Stress reaction 07/23/2012  . Essential hypertension 06/09/2012  . History of pulmonary embolism 06/09/2012  . Preop cardiovascular exam 06/08/2012  . Dysmenorrhea   . Dyspareunia   . Pelvic pain   . Cardiomyopathy (HCC)   . Cyst of ovary    Past Medical History  Diagnosis Date  . Dysmenorrhea   . Dyspareunia   . Pelvic pain   . Pulmonary embolism (HCC)   . Cardiomyopathy (HCC)   . Cyst of ovary   . History of chicken pox   . History of depression   . History of migraine     and frequent Headaches  . History of hay fever   . History of blood clots   . Complication of anesthesia     "hard to get under"  . 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 Hypertension    Past Surgical History  Procedure Laterality Date  . Cesarean section  1994  . Ectopic pregnancy surgery    . Abdominal exploration surgery    . Tubal ligation  2010  .  Laparoscopy N/A 09/24/2012    Procedure: OPERATIVE LAPROSCOPY WITH LYSIS OF ADHESIONS;  Surgeon: Sherron Monday, MD;  Location: [facility];  Service: Gynecology;  Laterality: N/A;   Social History  Substance Use Topics  . Smoking status: Never Smoker   . Smokeless tobacco: None  . Alcohol Use: 0.0 oz/week    0 Standard drinks or equivalent per week     Comment: occ   Family History  Problem Relation Age of Onset  . Cancer - Lung Mother   . Heart failure Father   . Hypertension Father    No Known Allergies Current Outpatient Prescriptions on File Prior to Visit  Medication Sig Dispense Refill  . dicyclomine (BENTYL) 20 MG tablet Take 1 tablet (20 mg total) by mouth 2 (two) times daily. 20 tablet 0  . HYDROcodone-acetaminophen (NORCO/VICODIN) 5-325 MG per tablet Take 1 tablet by mouth every 4 (four) hours as needed. 12 tablet 0  . ondansetron (ZOFRAN ODT) 4 MG disintegrating tablet Take 1 tablet (4 mg total) by mouth every 8 (eight) hours as needed for nausea or vomiting. 16 tablet 0  . zolpidem (AMBIEN) 5 MG tablet Take 5 mg by  mouth at bedtime as needed for sleep. Reported on 04/09/2015     No current facility-administered medications on file prior to visit.    Review of Systems    Review of Systems  Constitutional: Negative for fever, appetite change, and unexpected weight change. pos for fatigue Eyes: Negative for pain and visual disturbance.  Respiratory: Negative for cough and shortness of breath.   Cardiovascular: Negative for cp or palpitations    Gastrointestinal: Negative for blood in stool, dark stool diarrhea and constipation. pos for heartburn Genitourinary: Negative for urgency and frequency. Pos for chronic pelvic pain/ worse during menses  Skin: Negative for pallor or rash   Neurological: Negative for weakness, light-headedness, numbness and headaches.  Hematological: Negative for adenopathy. Does not bruise/bleed easily.  Psychiatric/Behavioral: Negative for dysphoric  mood. The patient is not nervous/anxious.  pos for stressors that are improving     Objective:   Physical Exam  Constitutional: She appears well-developed and well-nourished. No distress.  overwt and well appearing   HENT:  Head: Normocephalic and atraumatic.  Mouth/Throat: Oropharynx is clear and moist.  Eyes: Conjunctivae and EOM are normal. Pupils are equal, round, and reactive to light.  Neck: Normal range of motion. Neck supple. No JVD present. Carotid bruit is not present. No thyromegaly present.  Cardiovascular: Normal rate, regular rhythm, normal heart sounds and intact distal pulses.  Exam reveals no gallop.   Pulmonary/Chest: Effort normal and breath sounds normal. No respiratory distress. She has no wheezes. She has no rales.  No crackles  Abdominal: Soft. Bowel sounds are normal. She exhibits no distension, no abdominal bruit, no pulsatile midline mass and no mass. There is no hepatosplenomegaly. There is tenderness in the right upper quadrant, right lower quadrant and epigastric area. There is no rigidity, no rebound, no guarding, no CVA tenderness, no tenderness at McBurney's point and negative Murphy's sign.  Musculoskeletal: She exhibits no edema.  Lymphadenopathy:    She has no cervical adenopathy.  Neurological: She is alert. She has normal reflexes. No cranial nerve deficit. She exhibits normal muscle tone. Coordination normal.  Skin: Skin is warm and dry. No rash noted. No pallor.  Psychiatric: She has a normal mood and affect.          Assessment & Plan:   Problem List Items Addressed This Visit      Cardiovascular and Mediastinum   Essential hypertension - Primary    Off of bp med currently  Refilled hctz and lisinopril and amlodipine  Will update if still not at goal in 2 wk Lab today F/u 2 mo   Disc DASH diet and exercise as tolerated       Relevant Medications   lisinopril (PRINIVIL,ZESTRIL) 10 MG tablet   hydrochlorothiazide (HYDRODIURIL) 25 MG  tablet   amLODipine (NORVASC) 5 MG tablet   Other Relevant Orders   CBC with Differential/Platelet (Completed)   Comprehensive metabolic panel (Completed)   TSH (Completed)   Lipid panel (Completed)     Digestive   GERD (gastroesophageal reflux disease)    Will start back on prilosec 20 mg  Disc diet for gerd Narcotic pain med may also cause some gastroparesis F/u 2 mo        Relevant Medications   omeprazole (PRILOSEC) 20 MG capsule     Other   Abdominal pain, epigastric    Pt states this does not feel like pancreatitis Suspect more likely gastritis Will get back on omeprazole  Watch diet F/u 2 mo - update if  no imp before then  Has zofran from her gyn as well       RESOLVED: Abdominal pain, left upper quadrant   Relevant Medications   omeprazole (PRILOSEC) 20 MG capsule   Endometriosis of pelvic peritoneum

## 2015-04-09 NOTE — Assessment & Plan Note (Signed)
Will start back on prilosec 20 mg  Disc diet for gerd Narcotic pain med may also cause some gastroparesis F/u 2 mo

## 2015-04-09 NOTE — Progress Notes (Signed)
Pre visit review using our clinic review tool, if applicable. No additional management support is needed unless otherwise documented below in the visit note. 

## 2015-04-09 NOTE — Assessment & Plan Note (Signed)
Off of bp med currently  Refilled hctz and lisinopril and amlodipine  Will update if still not at goal in 2 wk Lab today F/u 2 mo   Disc DASH diet and exercise as tolerated

## 2015-04-09 NOTE — Assessment & Plan Note (Signed)
Pt states this does not feel like pancreatitis Suspect more likely gastritis Will get back on omeprazole  Watch diet F/u 2 mo - update if no imp before then  Has zofran from her gyn as well

## 2015-04-23 ENCOUNTER — Other Ambulatory Visit (INDEPENDENT_AMBULATORY_CARE_PROVIDER_SITE_OTHER): Payer: Commercial Managed Care - HMO

## 2015-04-23 DIAGNOSIS — R748 Abnormal levels of other serum enzymes: Secondary | ICD-10-CM | POA: Diagnosis not present

## 2015-04-23 LAB — HEPATIC FUNCTION PANEL
ALT: 91 U/L — ABNORMAL HIGH (ref 0–35)
AST: 50 U/L — ABNORMAL HIGH (ref 0–37)
Albumin: 4.6 g/dL (ref 3.5–5.2)
Alkaline Phosphatase: 36 U/L — ABNORMAL LOW (ref 39–117)
Bilirubin, Direct: 0 mg/dL (ref 0.0–0.3)
Total Bilirubin: 0.3 mg/dL (ref 0.2–1.2)
Total Protein: 7.1 g/dL (ref 6.0–8.3)

## 2015-04-24 LAB — HEPATITIS PANEL, ACUTE
HCV Ab: NEGATIVE
Hep A IgM: NONREACTIVE
Hep B C IgM: NONREACTIVE
Hepatitis B Surface Ag: NEGATIVE

## 2016-02-28 DIAGNOSIS — Z1231 Encounter for screening mammogram for malignant neoplasm of breast: Secondary | ICD-10-CM | POA: Diagnosis not present

## 2016-02-28 DIAGNOSIS — Z01419 Encounter for gynecological examination (general) (routine) without abnormal findings: Secondary | ICD-10-CM | POA: Diagnosis not present

## 2016-02-28 DIAGNOSIS — R87612 Low grade squamous intraepithelial lesion on cytologic smear of cervix (LGSIL): Secondary | ICD-10-CM | POA: Diagnosis not present

## 2016-02-28 DIAGNOSIS — Z6824 Body mass index (BMI) 24.0-24.9, adult: Secondary | ICD-10-CM | POA: Diagnosis not present

## 2016-02-29 DIAGNOSIS — R87612 Low grade squamous intraepithelial lesion on cytologic smear of cervix (LGSIL): Secondary | ICD-10-CM | POA: Diagnosis not present

## 2016-04-16 DIAGNOSIS — R87612 Low grade squamous intraepithelial lesion on cytologic smear of cervix (LGSIL): Secondary | ICD-10-CM | POA: Diagnosis not present

## 2016-04-18 ENCOUNTER — Other Ambulatory Visit: Payer: Self-pay | Admitting: Family Medicine

## 2016-04-18 NOTE — Telephone Encounter (Signed)
Please schedule 30 mi f/u when able ad refill until then  Thanks

## 2016-04-18 NOTE — Telephone Encounter (Signed)
Pt hasn't been seen in over a year and no future appts., please advise  

## 2016-04-18 NOTE — Telephone Encounter (Signed)
Called pt and no answer and no voicemail box set up

## 2016-04-23 NOTE — Telephone Encounter (Signed)
Called pt again and no answer and no voicemail set up, Rxs declined and pharmacy advise to have pt call us

## 2016-07-07 ENCOUNTER — Emergency Department (HOSPITAL_COMMUNITY): Payer: 59

## 2016-07-07 ENCOUNTER — Emergency Department (HOSPITAL_COMMUNITY)
Admission: EM | Admit: 2016-07-07 | Discharge: 2016-07-07 | Disposition: A | Discharge status: Home / Self Care | Payer: 59 | Attending: Emergency Medicine | Admitting: Emergency Medicine

## 2016-07-07 DIAGNOSIS — I10 Essential (primary) hypertension: Secondary | ICD-10-CM | POA: Insufficient documentation

## 2016-07-07 DIAGNOSIS — R079 Chest pain, unspecified: Secondary | ICD-10-CM | POA: Diagnosis present

## 2016-07-07 DIAGNOSIS — Z79899 Other long term (current) drug therapy: Secondary | ICD-10-CM | POA: Insufficient documentation

## 2016-07-07 DIAGNOSIS — R109 Unspecified abdominal pain: Secondary | ICD-10-CM | POA: Diagnosis not present

## 2016-07-07 DIAGNOSIS — R0789 Other chest pain: Secondary | ICD-10-CM | POA: Diagnosis not present

## 2016-07-07 DIAGNOSIS — R0602 Shortness of breath: Secondary | ICD-10-CM | POA: Diagnosis not present

## 2016-07-07 LAB — CBC WITH DIFFERENTIAL/PLATELET
Basophils Absolute: 0 10*3/uL (ref 0.0–0.1)
Basophils Relative: 0 %
Eosinophils Absolute: 0.1 10*3/uL (ref 0.0–0.7)
Eosinophils Relative: 2 %
HCT: 37.1 % (ref 36.0–46.0)
Hemoglobin: 12.6 g/dL (ref 12.0–15.0)
Lymphocytes Relative: 19 %
Lymphs Abs: 1.4 10*3/uL (ref 0.7–4.0)
MCH: 31.4 pg (ref 26.0–34.0)
MCHC: 34 g/dL (ref 30.0–36.0)
MCV: 92.5 fL (ref 78.0–100.0)
Monocytes Absolute: 0.7 10*3/uL (ref 0.1–1.0)
Monocytes Relative: 9 %
Neutro Abs: 5.3 10*3/uL (ref 1.7–7.7)
Neutrophils Relative %: 70 %
Platelets: 195 10*3/uL (ref 150–400)
RBC: 4.01 MIL/uL (ref 3.87–5.11)
RDW: 12.4 % (ref 11.5–15.5)
WBC: 7.6 10*3/uL (ref 4.0–10.5)

## 2016-07-07 LAB — I-STAT TROPONIN, ED: Troponin i, poc: 0.02 ng/mL (ref 0.00–0.08)

## 2016-07-07 LAB — URINALYSIS, ROUTINE W REFLEX MICROSCOPIC
Bilirubin Urine: NEGATIVE
Glucose, UA: NEGATIVE mg/dL
Ketones, ur: 5 mg/dL — AB
Nitrite: NEGATIVE
Protein, ur: NEGATIVE mg/dL
Specific Gravity, Urine: 1.002 — ABNORMAL LOW (ref 1.005–1.030)
pH: 7 (ref 5.0–8.0)

## 2016-07-07 LAB — COMPREHENSIVE METABOLIC PANEL
ALT: 90 U/L — ABNORMAL HIGH (ref 14–54)
AST: 71 U/L — ABNORMAL HIGH (ref 15–41)
Albumin: 4.4 g/dL (ref 3.5–5.0)
Alkaline Phosphatase: 39 U/L (ref 38–126)
Anion gap: 14 (ref 5–15)
BUN: 6 mg/dL (ref 6–20)
CO2: 21 mmol/L — ABNORMAL LOW (ref 22–32)
Calcium: 8.9 mg/dL (ref 8.9–10.3)
Chloride: 100 mmol/L — ABNORMAL LOW (ref 101–111)
Creatinine, Ser: 0.58 mg/dL (ref 0.44–1.00)
GFR calc Af Amer: >60 mL/min (ref >60)
GFR calc non Af Amer: >60 mL/min (ref >60)
Glucose, Bld: 74 mg/dL (ref 65–99)
Potassium: 3.7 mmol/L (ref 3.5–5.1)
Sodium: 135 mmol/L (ref 135–145)
Total Bilirubin: 1.1 mg/dL (ref 0.3–1.2)
Total Protein: 7 g/dL (ref 6.5–8.1)

## 2016-07-07 LAB — ETHANOL: Alcohol, Ethyl (B): <5 mg/dL (ref <5)

## 2016-07-07 LAB — LIPASE, BLOOD: Lipase: 33 U/L (ref 11–51)

## 2016-07-07 LAB — I-STAT BETA HCG BLOOD, ED (MC, WL, AP ONLY): I-stat hCG, quantitative: <5 m[IU]/mL (ref <5)

## 2016-07-07 LAB — D-DIMER, QUANTITATIVE (NOT AT ARMC): D-Dimer, Quant: <0.27 ug/mL-FEU (ref 0.00–0.50)

## 2016-07-07 MED ORDER — IBUPROFEN 800 MG PO TABS
800.0000 mg | ORAL_TABLET | Freq: Once | ORAL | Status: AC
Start: 2016-07-07 — End: 2016-07-07
  Administered 2016-07-07: 800 mg via ORAL
  Filled 2016-07-07: qty 1

## 2016-07-07 MED ORDER — HYDROCHLOROTHIAZIDE 25 MG PO TABS: 25.0000 mg | ORAL_TABLET | Freq: Every day | ORAL | 0 refills | Status: DC

## 2016-07-07 MED ORDER — ONDANSETRON HCL 4 MG/2ML IJ SOLN
4.0000 mg | Freq: Once | INTRAMUSCULAR | Status: AC
  Administered 2016-07-07: 4 mg via INTRAVENOUS
  Filled 2016-07-07: qty 2

## 2016-07-07 MED ORDER — SODIUM CHLORIDE 0.9 % IV BOLUS (SEPSIS)
1000.0000 mL | Freq: Once | INTRAVENOUS | Status: AC
  Administered 2016-07-07: 1000 mL via INTRAVENOUS

## 2016-07-07 MED ORDER — AMLODIPINE BESYLATE 5 MG PO TABS: 5.0000 mg | ORAL_TABLET | Freq: Every day | ORAL | 0 refills | Status: DC

## 2016-07-07 MED ORDER — MORPHINE SULFATE (PF) 4 MG/ML IV SOLN
4.0000 mg | Freq: Once | INTRAVENOUS | Status: AC
  Administered 2016-07-07: 4 mg via INTRAVENOUS
  Filled 2016-07-07: qty 1

## 2016-07-07 MED ORDER — LISINOPRIL 10 MG PO TABS: 10.0000 mg | ORAL_TABLET | Freq: Every day | ORAL | 1 refills | Status: DC

## 2016-07-07 NOTE — ED Notes (Signed)
Patient Alert and oriented X4. Stable and ambulatory. Patient verbalized understanding of the discharge instructions.  Patient belongings were taken by the patient.  

## 2016-07-07 NOTE — ED Provider Notes (Signed)
[facility] DEPT Provider Note   CSN: [id number] Arrival date & time: 07/07/16  1638     History   Chief Complaint Chief Complaint  Patient presents with  . Abdominal Pain    HPI Nicole Hughes is a 46 y.o. female.  The history is provided by the patient and medical records.  Abdominal Pain   Associated symptoms include nausea.    46 year old female with hx of endometriosis, ovarian cysts, history of PE, hypertension, presenting to the ED for chest and abdominal pain.  Patient states she has a long-standing history of chronic abdominal pain because of her endometriosis. States she's been saving up over the past several years in order to have surgery. States she has been self-medicating at home with alcohol, about 3-4 drinks a day.  She did attend a concert with her husband over the weekend and was drinking all day that day out in the sun and heat as well.  Last drink was yesterday.  States today she had some worsening abdominal pain which is since radiated up into her chest. She did have some nausea/vomiting this morning.  No dizziness or diaphoresis.  States it is mostly left-sided. She has a sharp pain with deep breathing and some pain radiating into her left arm and left side of her neck. She does have history of cardiomyopathy secondary pregnancy as well as pulmonary embolism 2. She is not currently on anticoagulation. Her blood pressure here is elevated, states she has been out of her medications for about 2 months now.  No smoking hx.  Father has a "heart pump" for about 3 years now.  Past Medical History:  Diagnosis Date  . Cardiomyopathy (HCC)   . Complication of anesthesia    "hard to get under"  . Cyst of ovary   . Dysmenorrhea   . Dyspareunia   . 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 History of blood clots   . History of chicken pox   . History of depression   . History of hay fever   . History of migraine    and frequent Headaches  . Hypertension   .  Pelvic pain   . Pulmonary embolism [facility])     Patient Active Problem List   Diagnosis Date Noted  . GERD (gastroesophageal reflux disease) 04/09/2015  . Abdominal pain, epigastric 01/17/2014  . 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 Stress reaction 07/23/2012  . Essential hypertension 06/09/2012  . History of pulmonary embolism 06/09/2012  . Preop cardiovascular exam 06/08/2012  . Dysmenorrhea   . Dyspareunia   . Pelvic pain   . Cardiomyopathy (HCC)   . Cyst of ovary     Past Surgical History:  Procedure Laterality Date  . ABDOMINAL EXPLORATION SURGERY    . CESAREAN SECTION  1994  . ECTOPIC PREGNANCY SURGERY    . LAPAROSCOPY N/A 09/24/2012   Procedure: OPERATIVE LAPROSCOPY WITH LYSIS OF ADHESIONS;  Surgeon: Sherron Monday, MD;  Location: [facility];  Service: Gynecology;  Laterality: N/A;  . TUBAL LIGATION  2010    OB History    No data available       Home Medications    Prior to Admission medications   Medication Sig Start Date End Date Taking? Authorizing Provider  amLODipine (NORVASC) 5 MG tablet Take 1 tablet (5 mg total) by mouth daily. 04/09/15   Tower, Audrie Gallus, MD  dicyclomine (BENTYL) 20 MG tablet Take 1 tablet (20 mg total) by mouth 2 (two) times daily. 06/22/14  Everlene Farrier, PA-C  hydrochlorothiazide (HYDRODIURIL) 25 MG tablet Take 1 tablet (25 mg total) by mouth daily. 04/09/15   Tower, Audrie Gallus, MD  HYDROcodone-acetaminophen (NORCO/VICODIN) 5-325 MG per tablet Take 1 tablet by mouth every 4 (four) hours as needed. 06/22/14   Everlene Farrier, PA-C  lisinopril (PRINIVIL,ZESTRIL) 10 MG tablet Take 1 tablet (10 mg total) by mouth daily. 04/09/15   Tower, Audrie Gallus, MD  omeprazole (PRILOSEC) 20 MG capsule Take 1 capsule (20 mg total) by mouth daily. 04/09/15   Tower, Audrie Gallus, MD  ondansetron (ZOFRAN ODT) 4 MG disintegrating tablet Take 1 tablet (4 mg total) by mouth every 8 (eight) hours as needed for nausea or vomiting. 06/22/14   Everlene Farrier, PA-C  zolpidem  (AMBIEN) 5 MG tablet Take 5 mg by mouth at bedtime as needed for sleep. Reported on 04/09/2015 05/26/14   [provider]    Family History Family History  Problem Relation Age of Onset  . Cancer - Lung Mother   . Heart failure Father   . Hypertension Father     Social History Social History  Substance Use Topics  . Smoking status: Never Smoker  . Smokeless tobacco: Not on file  . Alcohol use 0.0 oz/week     Comment: occ     Allergies   Patient has no known allergies.   Review of Systems Review of Systems  Cardiovascular: Positive for chest pain.  Gastrointestinal: Positive for abdominal pain and nausea.  All other systems reviewed and are negative.    Physical Exam Updated Vital Signs BP (!) 195/108 (BP Location: Right Arm)   Temp 98.3 F (36.8 C)   Ht 5\' 5"  (1.651 m)   SpO2 99%   Physical Exam  Constitutional: She is oriented to person, place, and time. She appears well-developed and well-nourished.  HENT:  Head: Normocephalic and atraumatic.  Mouth/Throat: Oropharynx is clear and moist.  Eyes: Conjunctivae and EOM are normal. Pupils are equal, round, and reactive to light.  Neck: Normal range of motion.  Cardiovascular: Normal rate, regular rhythm and normal heart sounds.   Pulmonary/Chest: Effort normal and breath sounds normal. No respiratory distress. She has no wheezes.  Abdominal: Soft. Bowel sounds are normal. There is no tenderness. There is no rebound.  Musculoskeletal: Normal range of motion.  Neurological: She is alert and oriented to person, place, and time.  AAOx3, answering questions and following commands appropriately; equal strength UE and LE bilaterally; CN grossly intact; moves all extremities appropriately without ataxia; no focal neuro deficits or facial asymmetry appreciated; no tremors or seizure activity  Skin: Skin is warm and dry.  Psychiatric: She has a normal mood and affect.  Nursing note and vitals reviewed.    ED  Treatments / Results  Labs (all labs ordered are listed, but only abnormal results are displayed) Labs Reviewed  COMPREHENSIVE METABOLIC PANEL - Abnormal; Notable for the following:       Result Value   Chloride 100 (*)    CO2 21 (*)    AST 71 (*)    ALT 90 (*)    All other components within normal limits  URINALYSIS, ROUTINE W REFLEX MICROSCOPIC - Abnormal; Notable for the following:    Color, Urine STRAW (*)    Specific Gravity, Urine 1.002 (*)    Hgb urine dipstick MODERATE (*)    Ketones, ur 5 (*)    Leukocytes, UA LARGE (*)    Bacteria, UA FEW (*)    Squamous Epithelial /  LPF 0-5 (*)    All other components within normal limits  CBC WITH DIFFERENTIAL/PLATELET  LIPASE, BLOOD  D-DIMER, QUANTITATIVE (NOT AT [facility])  ETHANOL  I-STAT TROPOININ, ED  I-STAT BETA HCG BLOOD, ED ([facility], [facility], [facility] ONLY)    EKG  EKG Interpretation  Date/Time:  Monday July 07 2016 16:51:19 EDT Ventricular Rate:  111 PR Interval:    QRS Duration: 75 QT Interval:  351 QTC Calculation: 477 R Axis:   37 Text Interpretation:  Sinus tachycardia Probable left atrial enlargement Confirmed by Geoffery LyonseLo, Douglas ([id number]) on 07/07/2016 5:05:02 PM       Radiology Dg Chest 2 View  Result Date: 07/07/2016 CLINICAL DATA:  Mid left chest pain and shortness of breath today EXAM: CHEST  2 VIEW COMPARISON:  June 22, 2014 FINDINGS: The heart size and mediastinal contours are within normal limits. Both lungs are clear. The visualized skeletal structures are unremarkable. IMPRESSION: No active cardiopulmonary disease. Electronically Signed   By: Sherian ReinWei-Chen  Lin M.D.   On: 07/07/2016 17:38    Procedures Procedures (including critical care time)  Medications Ordered in ED Medications - No data to display   Initial Impression / Assessment and Plan / ED Course  I have reviewed the triage vital signs and the nursing notes.  Pertinent labs & imaging results that were available during my care of the patient were reviewed by me  and considered in my medical decision making (see chart for details).  46 y.o F here with Abdominal pain which is now radiated into the chest.  Hx of pancreatitis, does admit she has been drinking.  Last drink yesterday.  No tremors or seizure activity.  No signs of acute withdrawal here.  AAOx3.  Abdomen soft, overall benign.  EKG with sinus tach, no acute ischemic changes. Lab work here including troponin and d-dimer negative. Remainder of labs also reassuring-- LFT's mildly bumped but appear baseline when compared with prior.  UA with large leuks, few bacteria.  Denies urinary symptoms. Chest x-ray is clear. Patient's tachycardia resolved with IV fluids. Blood pressure improved but still remains elevated. This is likely due to the fact she's not had her medications in several months. I do not see any evidence of end organ damage today. I have low suspicion for ACS, PE, dissection, or other acute cardiac event given her negative evaluation here and nature of her symptoms. She is stable for discharge home. I refilled her home blood pressure medications and recommended that she follow-up closely with her primary care doctor.  Discussed plan with patient, she acknowledged understanding and agreed with plan of care.  Return precautions given for new or worsening symptoms.  Final Clinical Impressions(s) / ED Diagnoses   Final diagnoses:  Abdominal pain, unspecified abdominal location  Chest pain, unspecified type    New Prescriptions New Prescriptions   AMLODIPINE (NORVASC) 5 MG TABLET    Take 1 tablet (5 mg total) by mouth daily.   HYDROCHLOROTHIAZIDE (HYDRODIURIL) 25 MG TABLET    Take 1 tablet (25 mg total) by mouth daily.   LISINOPRIL (PRINIVIL,ZESTRIL) 10 MG TABLET    Take 1 tablet (10 mg total) by mouth daily.     Garlon HatchetSanders, Bowman Higbie M, PA-C 07/07/16 2041    Garlon HatchetSanders, Jolayne Branson M, PA-C 07/07/16 2220    Geoffery Lyonselo, Douglas, MD 07/07/16 [phone]

## 2016-07-07 NOTE — ED Triage Notes (Signed)
Patient comes in per [facility] with abd pain. Hx endometriosis and pancreatitis. Patient states had some drinks this weekend. Chronic abd pain but worse today. Today at 1400 radiated to chest. abd pain got worse and turned into cp. Patient took 324 mg aspirin. Ems v/s 220/110, 130 HR intially but 500 fluid and 50 mcg fentanyl brought HR down to 110. 71 cbg. Hx of HTN and not on meds. 18 LFA.

## 2016-07-07 NOTE — ED Notes (Signed)
Patient transported to X-ray 

## 2016-07-07 NOTE — Discharge Instructions (Signed)
I also discussed her labs and imaging studies today were normal. Your blood pressure was high today. I have represcribed her home medications, your blood pressure should start trending down and back to normal. I recommend that you follow-up closely with your primary care doctor. Return here for any new or worsening symptoms.

## 2016-08-25 ENCOUNTER — Encounter (HOSPITAL_COMMUNITY): Payer: Self-pay | Admitting: Nurse Practitioner

## 2016-08-25 ENCOUNTER — Emergency Department (HOSPITAL_COMMUNITY): Payer: 59

## 2016-08-25 ENCOUNTER — Telehealth: Payer: Self-pay | Admitting: Family Medicine

## 2016-08-25 ENCOUNTER — Emergency Department (HOSPITAL_COMMUNITY)
Admission: EM | Admit: 2016-08-25 | Discharge: 2016-08-26 | Disposition: A | Discharge status: Home / Self Care | Payer: 59 | Attending: Emergency Medicine | Admitting: Emergency Medicine

## 2016-08-25 DIAGNOSIS — I10 Essential (primary) hypertension: Secondary | ICD-10-CM | POA: Diagnosis not present

## 2016-08-25 DIAGNOSIS — Z79899 Other long term (current) drug therapy: Secondary | ICD-10-CM | POA: Insufficient documentation

## 2016-08-25 DIAGNOSIS — R51 Headache: Secondary | ICD-10-CM | POA: Insufficient documentation

## 2016-08-25 DIAGNOSIS — M542 Cervicalgia: Secondary | ICD-10-CM | POA: Insufficient documentation

## 2016-08-25 DIAGNOSIS — R079 Chest pain, unspecified: Secondary | ICD-10-CM | POA: Diagnosis not present

## 2016-08-25 LAB — BASIC METABOLIC PANEL
Anion gap: 8 (ref 5–15)
BUN: 5 mg/dL — ABNORMAL LOW (ref 6–20)
CO2: 28 mmol/L (ref 22–32)
Calcium: 9.4 mg/dL (ref 8.9–10.3)
Chloride: 99 mmol/L — ABNORMAL LOW (ref 101–111)
Creatinine, Ser: 0.73 mg/dL (ref 0.44–1.00)
GFR calc Af Amer: >60 mL/min (ref >60)
GFR calc non Af Amer: >60 mL/min (ref >60)
Glucose, Bld: 95 mg/dL (ref 65–99)
Potassium: 3.6 mmol/L (ref 3.5–5.1)
Sodium: 135 mmol/L (ref 135–145)

## 2016-08-25 LAB — CBC
HCT: 35.3 % — ABNORMAL LOW (ref 36.0–46.0)
Hemoglobin: 12 g/dL (ref 12.0–15.0)
MCH: 30.8 pg (ref 26.0–34.0)
MCHC: 34 g/dL (ref 30.0–36.0)
MCV: 90.7 fL (ref 78.0–100.0)
Platelets: 197 10*3/uL (ref 150–400)
RBC: 3.89 MIL/uL (ref 3.87–5.11)
RDW: 13.1 % (ref 11.5–15.5)
WBC: 5.9 10*3/uL (ref 4.0–10.5)

## 2016-08-25 LAB — I-STAT TROPONIN, ED
Troponin i, poc: 0 ng/mL (ref 0.00–0.08)
Troponin i, poc: 0 ng/mL (ref 0.00–0.08)

## 2016-08-25 LAB — D-DIMER, QUANTITATIVE (NOT AT ARMC): D-Dimer, Quant: 0.28 ug/mL-FEU (ref 0.00–0.50)

## 2016-08-25 MED ORDER — METOCLOPRAMIDE HCL 5 MG/ML IJ SOLN
10.0000 mg | Freq: Once | INTRAMUSCULAR | Status: AC
  Administered 2016-08-25: 10 mg via INTRAVENOUS
  Filled 2016-08-25: qty 2

## 2016-08-25 MED ORDER — DIPHENHYDRAMINE HCL 50 MG/ML IJ SOLN
25.0000 mg | Freq: Once | INTRAMUSCULAR | Status: AC
  Administered 2016-08-25: 25 mg via INTRAVENOUS
  Filled 2016-08-25: qty 1

## 2016-08-25 MED ORDER — DEXAMETHASONE SODIUM PHOSPHATE 10 MG/ML IJ SOLN
10.0000 mg | Freq: Once | INTRAMUSCULAR | Status: AC
  Administered 2016-08-25: 10 mg via INTRAVENOUS
  Filled 2016-08-25: qty 1

## 2016-08-25 MED ORDER — KETOROLAC TROMETHAMINE 30 MG/ML IJ SOLN
30.0000 mg | Freq: Once | INTRAMUSCULAR | Status: AC
  Administered 2016-08-25: 30 mg via INTRAVENOUS
  Filled 2016-08-25: qty 1

## 2016-08-25 MED ORDER — PROCHLORPERAZINE EDISYLATE 5 MG/ML IJ SOLN
10.0000 mg | Freq: Once | INTRAMUSCULAR | Status: AC
  Administered 2016-08-25: 10 mg via INTRAVENOUS
  Filled 2016-08-25: qty 2

## 2016-08-25 MED ORDER — ASPIRIN 81 MG PO CHEW
324.0000 mg | CHEWABLE_TABLET | Freq: Once | ORAL | Status: AC
  Administered 2016-08-25: 324 mg via ORAL
  Filled 2016-08-25: qty 4

## 2016-08-25 MED ORDER — DIAZEPAM 5 MG PO TABS
5.0000 mg | ORAL_TABLET | Freq: Once | ORAL | Status: AC
  Administered 2016-08-25: 5 mg via ORAL
  Filled 2016-08-25: qty 1

## 2016-08-25 MED ORDER — SODIUM CHLORIDE 0.9 % IV BOLUS (SEPSIS)
1000.0000 mL | Freq: Once | INTRAVENOUS | Status: AC
  Administered 2016-08-25: 1000 mL via INTRAVENOUS

## 2016-08-25 MED ORDER — MAGNESIUM SULFATE 2 GM/50ML IV SOLN
2.0000 g | Freq: Once | INTRAVENOUS | Status: AC
  Administered 2016-08-25: 2 g via INTRAVENOUS
  Filled 2016-08-25: qty 50

## 2016-08-25 NOTE — ED Provider Notes (Signed)
[facility] Provider Note   CSN: [id number] Arrival date & time: 08/25/16  1348     History   Chief Complaint Chief Complaint  Patient presents with  . Chest Pain    HPI [name] is a 46 y.o. female.  46 year old female with past medical history including hypertension, postpartum cardiomyopathy, PE in the setting of pregnancy, migraines, GERD who presents with chest pain. 6 days ago, the patient began having right anterior chest pain that radiates into her right shoulder and right upper back. She describes the pain as a constant tight pressure that is also sometimes sharp. The pain has been constant for the entirety of the last 6 days. She reports that around the same time that the chest pain started, she began having radiation of pain up into her neck and then started having a headache. She reports pain when she tries to breathe and some associated shortness of breath and nausea. She reports intermittent blurry vision, dizziness, and pressure behind her right eye. She describes her neck pain as "it feels like my neck is trying to constrict around my esophagus." She has had low grade fever up to 100, vomiting but no diarrhea, cough, or urinary sx. No relief after ibuprofen, aspirin, and heating pad at home. She denies head trauma, chiropractic manipulation, or any neck trauma recently. No recent travel, leg swelling, or h/o cancer. She denies alcohol or drug use.   The history is provided by the patient.  Chest Pain      Past Medical History:  Diagnosis Date  . Cardiomyopathy (HCC)   . Complication of anesthesia    "hard to get under"  . Cyst of ovary   . Dysmenorrhea   . Dyspareunia   . 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 History of blood clots   . History of chicken pox   . History of depression   . History of hay fever   . History of migraine    and frequent Headaches  . Hypertension   . Pelvic pain   . Pulmonary embolism [facility])     Patient  Active Problem List   Diagnosis Date Noted  . GERD (gastroesophageal reflux disease) 04/09/2015  . Abdominal pain, epigastric 01/17/2014  . 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 Stress reaction 07/23/2012  . Essential hypertension 06/09/2012  . History of pulmonary embolism 06/09/2012  . Preop cardiovascular exam 06/08/2012  . Dysmenorrhea   . Dyspareunia   . Pelvic pain   . Cardiomyopathy (HCC)   . Cyst of ovary     Past Surgical History:  Procedure Laterality Date  . ABDOMINAL EXPLORATION SURGERY    . CESAREAN SECTION  1994  . ECTOPIC PREGNANCY SURGERY    . LAPAROSCOPY N/A 09/24/2012   Procedure: OPERATIVE LAPROSCOPY WITH LYSIS OF ADHESIONS;  Surgeon: Sherron Monday, MD;  Location: [facility];  Service: Gynecology;  Laterality: N/A;  . TUBAL LIGATION  2010    OB History    No data available       Home Medications    Prior to Admission medications   Medication Sig Start Date End Date Taking? Authorizing Provider  amLODipine (NORVASC) 5 MG tablet Take 1 tablet (5 mg total) by mouth daily. 07/07/16  Yes Garlon Hatchet, PA-C  hydrochlorothiazide (HYDRODIURIL) 25 MG tablet Take 1 tablet (25 mg total) by mouth daily. 07/07/16  Yes Garlon Hatchet, PA-C  lisinopril (PRINIVIL,ZESTRIL) 10 MG tablet Take 1 tablet (10 mg total) by mouth daily. 07/07/16  Yes Garlon Hatchet, PA-C  diazepam (VALIUM) 5 MG tablet Take 1 tablet (5 mg total) by mouth every 8 (eight) hours as needed for muscle spasms. 08/26/16   Margarine Grosshans, Ambrose Finland, MD  dicyclomine (BENTYL) 20 MG tablet Take 1 tablet (20 mg total) by mouth 2 (two) times daily. Patient not taking: Reported on 08/25/2016 06/22/14   Everlene Farrier, PA-C  HYDROcodone-acetaminophen (NORCO/VICODIN) 5-325 MG per tablet Take 1 tablet by mouth every 4 (four) hours as needed. Patient not taking: Reported on 08/25/2016 06/22/14   Everlene Farrier, PA-C  omeprazole (PRILOSEC) 20 MG capsule Take 1 capsule (20 mg total) by mouth daily. Patient not taking:  Reported on 08/25/2016 04/09/15   Tower, Audrie Gallus, MD  ondansetron (ZOFRAN ODT) 4 MG disintegrating tablet Take 1 tablet (4 mg total) by mouth every 8 (eight) hours as needed for nausea or vomiting. Patient not taking: Reported on 08/25/2016 06/22/14   Everlene Farrier, PA-C    Family History Family History  Problem Relation Age of Onset  . Cancer - Lung Mother   . Heart failure Father   . Hypertension Father     Social History Social History  Substance Use Topics  . Smoking status: Never Smoker  . Smokeless tobacco: Never Used  . Alcohol use No     Comment: occ     Allergies   Patient has no known allergies.   Review of Systems Review of Systems  Cardiovascular: Positive for chest pain.   All other systems reviewed and are negative except that which was mentioned in HPI   Physical Exam Updated Vital Signs BP (!) 153/89   Pulse 88   Temp 98.3 F (36.8 C)   Resp 18   Ht 5\' 5"  (1.651 m)   Wt 73.5 kg (162 lb)   LMP 08/19/2016   SpO2 100%   BMI 26.96 kg/m   Physical Exam  Constitutional: She is oriented to person, place, and time. She appears well-developed and well-nourished. She appears distressed.  Uncomfortable, rubbing R chest  HENT:  Head: Normocephalic and atraumatic.  Mouth/Throat: Oropharynx is clear and moist.  Moist mucous membranes  Eyes: Pupils are equal, round, and reactive to light. Conjunctivae and EOM are normal.  Neck: Neck supple. No tracheal deviation present. No thyromegaly present.  No swelling or asymmetry on R anterior and lateral neck  Cardiovascular: Normal rate, regular rhythm and normal heart sounds.   No murmur heard. Pulmonary/Chest: Effort normal and breath sounds normal. She exhibits no tenderness.  Abdominal: Soft. Bowel sounds are normal. She exhibits no distension. There is no tenderness.  Musculoskeletal: She exhibits no edema.  Tenderness of R posterior shoulder and lower trapezius muscle  Neurological: She is alert and  oriented to person, place, and time. No cranial nerve deficit or sensory deficit. She exhibits normal muscle tone. Coordination normal.  Fluent speech, normal finger to nose testing, negative pronator drift, no clonus  Skin: Skin is warm and dry. No rash noted.  Psychiatric: Judgment normal.  Anxious, distressed  Nursing note and vitals reviewed.    ED Treatments / Results  Labs (all labs ordered are listed, but only abnormal results are displayed) Labs Reviewed  BASIC METABOLIC PANEL - Abnormal; Notable for the following:       Result Value   Chloride 99 (*)    BUN 5 (*)    All other components within normal limits  CBC - Abnormal; Notable for the following:    HCT 35.3 (*)  All other components within normal limits  D-DIMER, QUANTITATIVE (NOT AT [facility])  I-STAT TROPONIN, ED  I-STAT TROPONIN, ED    EKG  EKG Interpretation  Date/Time:  Monday August 25 2016 13:52:08 EDT Ventricular Rate:  76 PR Interval:  140 QRS Duration: 70 QT Interval:  386 QTC Calculation: 434 R Axis:   56 Text Interpretation:  Normal sinus rhythm Normal ECG since previous tracing, tachycardia resolved Confirmed by Frederick Peers [phone]) on 08/25/2016 6:49:11 PM       Radiology Dg Chest 2 View  Result Date: 08/25/2016 CLINICAL DATA:  Right-sided chest pain extending into the gel with dizziness, headache and lightheadedness for 6 days. EXAM: CHEST  2 VIEW COMPARISON:  07/07/2016 and 06/22/2014. FINDINGS: The heart size and mediastinal contours are normal. The lungs are clear. There is no pleural effusion or pneumothorax. No acute osseous findings are identified. IMPRESSION: Stable chest.  No active cardiopulmonary process. Electronically Signed   By: Carey Bullocks M.D.   On: 08/25/2016 14:33   Ct Head Wo Contrast  Result Date: 08/25/2016 CLINICAL DATA:  Thunderclap headache EXAM: CT HEAD WITHOUT CONTRAST TECHNIQUE: Contiguous axial images were obtained from the base of the skull through the vertex  without intravenous contrast. COMPARISON:  None. FINDINGS: Brain: No mass lesion, intraparenchymal hemorrhage or extra-axial collection. No evidence of acute cortical infarct. Brain parenchyma and CSF-containing spaces are normal for age. Vascular: No hyperdense vessel or unexpected calcification. Skull: Normal visualized skull base, calvarium and extracranial soft tissues. Sinuses/Orbits: No sinus fluid levels or advanced mucosal thickening. No mastoid effusion. Normal orbits. IMPRESSION: Normal head CT. Electronically Signed   By: Deatra Robinson M.D.   On: 08/25/2016 20:49    Procedures Procedures (including critical care time)  Medications Ordered in ED Medications  diphenhydrAMINE (BENADRYL) injection 25 mg (25 mg Intravenous Given 08/25/16 1944)  prochlorperazine (COMPAZINE) injection 10 mg (10 mg Intravenous Given 08/25/16 1944)  aspirin chewable tablet 324 mg (324 mg Oral Given 08/25/16 1944)  sodium chloride 0.9 % bolus 1,000 mL (0 mLs Intravenous Stopped 08/25/16 2216)  magnesium sulfate IVPB 2 g 50 mL (0 g Intravenous Stopped 08/25/16 2323)  diphenhydrAMINE (BENADRYL) injection 25 mg (25 mg Intravenous Given 08/25/16 2218)  metoCLOPramide (REGLAN) injection 10 mg (10 mg Intravenous Given 08/25/16 2220)  ketorolac (TORADOL) 30 MG/ML injection 30 mg (30 mg Intravenous Given 08/25/16 2219)  diazepam (VALIUM) tablet 5 mg (5 mg Oral Given 08/25/16 2328)  dexamethasone (DECADRON) injection 10 mg (10 mg Intravenous Given 08/25/16 2328)     Initial Impression / Assessment and Plan / ED Course  I have reviewed the triage vital signs and the nursing notes.  Pertinent labs & imaging results that were available during my care of the patient were reviewed by me and considered in my medical decision making (see chart for details).     Pt w/ 6 days of constant R sided chest pain radiating to R posterior shoulder and up neck into head. On exam, she was uncomfortable and in mild distress due to pain. Vital  signs notable for hypertension at 179/96, afebrile. Normal neurologic exam. I could not reproduce her chest pain on palpation but she did have tenderness of her right posterior shoulder and trapezius muscles. Her EKG showed no evidence of acute ischemia. Labs show negative serial troponins, negative d-dimer, normal CBC and BMP. CXR negative for acute process. Pt given migraine cocktail as well as aspirin and IVF bolus. I did obtain head CT given she describes headache as nothing  like migraines, ddx includes bleed. Head CT negative acute.  Given CP constant for 6 days and multiple other sx, I feel ACS is unlikely given normal EKG and trops. Her HEART score is <3. Negative d-dimer and no recent risk factors for clotting makes PE unlikely. She is point-tender on her right upper back near scapula which is where she endorses some of her pain, and this exam finding as well as her atypical description make aortic dissection extremely unlikely.   I considered vascular pathology of head or neck but patient's constellation of symptoms including chest pain and pain with breathing do not fit carotid dissection. She also denies any recent trauma or risk factors for dissection. Her neurologic exam is normal with normal BUE strength and sensation. I did give toradol because her point-tenderness of R posterior shoulder suggests possible musculoskeletal etiology.   On re-examination, she remained uncomfortable but with reassuring VS. She describes a squeezing, tightening pain of her R base of neck radiating upwards. I have recommended a trial of muscle relaxants and gave first dose in the ED. She asked about pain medications and I recommended NSAIDs. She then told me she can't tolerate NSAIDs however originally she had told me she took ibuprofen and ASA at home. I offered a one time dose of steroid for headache in the event that its anti-inflammatory properties may offer some relief. I explained that I do not treat this type  of pain with narcotics. Instructed to see PCP in 2 days if not improved. Return precautions given. Pt discharged in satisfactory condition. Final Clinical Impressions(s) / ED Diagnoses   Final diagnoses:  Right-sided chest pain  Neck pain on right side    New Prescriptions Discharge Medication List as of 08/26/2016 12:05 AM    START taking these medications   Details  diazepam (VALIUM) 5 MG tablet Take 1 tablet (5 mg total) by mouth every 8 (eight) hours as needed for muscle spasms., Starting Tue 08/26/2016, Print         Lundy Cozart, Ambrose Finland, MD 08/26/16 [phone]

## 2016-08-25 NOTE — ED Triage Notes (Addendum)
Pt presents with c/o chest pain. The chest pain began in the right side of her chest and right shoulder last week. She describes as a constant tight pressure since onset. This week the pain got worse and began to radiate into her neck. she's also had low grade fevers, headaches, shortness of breath and nausea this week. She denies cough, vomiting. She has tried heating pad, ibuprofen at home with no relief of her symptoms

## 2016-08-25 NOTE — Telephone Encounter (Signed)
Agree with advisement  Will watch for ED notes  

## 2016-08-25 NOTE — Telephone Encounter (Signed)
[facility] Day - Client TELEPHONE ADVICE RECORD [facility]  Patient Name: MICHELLE REDPATH  DOB: 04-Nov-1970    Initial Comment Caller states she has chest pain, nausea and back pain.   Nurse Assessment  Nurse: Laural Benes, RN, Dondra Spry Date/Time Lamount Cohen Time): 08/25/2016 8:57:35 AM  Confirm and document reason for call. If symptomatic, describe symptoms. ---Claris Che is having back pain mid back right side back/chest has intense pressure/pain nausea present with this headache present; pain radiates up into right neck hurts to take a deep breath low grade fever 99.5 onset Tuesday  Does the patient have any new or worsening symptoms? ---Yes  Will a triage be completed? ---Yes  Related visit to physician within the last 2 weeks? ---No  Does the PT have any chronic conditions? (i.e. diabetes, asthma, etc.) ---Yes  List chronic conditions. ---HTN  Is the patient pregnant or possibly pregnant? (Ask all females between the ages of 71-55) ---No  Is this a behavioral health or substance abuse call? ---No     Guidelines    Guideline Title Affirmed Question Affirmed Notes  Chest Pain History of prior "blood clot" in leg or lungs (i.e., deep vein thrombosis, pulmonary embolism)    Final Disposition User   Go to ED Now Laural Benes, RN, Dondra Spry    Referrals  [facility] - ED   Disagree/Comply: Comply

## 2016-08-26 MED ORDER — DIAZEPAM 5 MG PO TABS: 5.0000 mg | ORAL_TABLET | Freq: Three times a day (TID) | ORAL | 0 refills | Status: DC | PRN

## 2016-09-22 ENCOUNTER — Ambulatory Visit (INDEPENDENT_AMBULATORY_CARE_PROVIDER_SITE_OTHER): Payer: 59 | Admitting: Family Medicine

## 2016-09-22 ENCOUNTER — Encounter: Payer: Self-pay | Admitting: Family Medicine

## 2016-09-22 ENCOUNTER — Telehealth: Payer: Self-pay | Admitting: Family Medicine

## 2016-09-22 VITALS — BP 126/76 | HR 116 | Temp 98.5°F | Ht 65.0 in | Wt 141.5 lb

## 2016-09-22 DIAGNOSIS — K219 Gastro-esophageal reflux disease without esophagitis: Secondary | ICD-10-CM | POA: Diagnosis not present

## 2016-09-22 DIAGNOSIS — I10 Essential (primary) hypertension: Secondary | ICD-10-CM

## 2016-09-22 DIAGNOSIS — R102 Pelvic and perineal pain: Secondary | ICD-10-CM | POA: Diagnosis not present

## 2016-09-22 DIAGNOSIS — R109 Unspecified abdominal pain: Secondary | ICD-10-CM | POA: Insufficient documentation

## 2016-09-22 DIAGNOSIS — F1010 Alcohol abuse, uncomplicated: Secondary | ICD-10-CM | POA: Insufficient documentation

## 2016-09-22 DIAGNOSIS — R10A1 Flank pain, right side: Secondary | ICD-10-CM | POA: Insufficient documentation

## 2016-09-22 DIAGNOSIS — F1011 Alcohol abuse, in remission: Secondary | ICD-10-CM

## 2016-09-22 DIAGNOSIS — R1012 Left upper quadrant pain: Secondary | ICD-10-CM

## 2016-09-22 DIAGNOSIS — Z87898 Personal history of other specified conditions: Secondary | ICD-10-CM

## 2016-09-22 DIAGNOSIS — Z8719 Personal history of other diseases of the digestive system: Secondary | ICD-10-CM | POA: Diagnosis not present

## 2016-09-22 DIAGNOSIS — R1011 Right upper quadrant pain: Secondary | ICD-10-CM

## 2016-09-22 DIAGNOSIS — F430 Acute stress reaction: Secondary | ICD-10-CM

## 2016-09-22 LAB — COMPREHENSIVE METABOLIC PANEL
ALT: 408 U/L — ABNORMAL HIGH (ref 0–35)
AST: 406 U/L — ABNORMAL HIGH (ref 0–37)
Albumin: 5.1 g/dL (ref 3.5–5.2)
Alkaline Phosphatase: 41 U/L (ref 39–117)
BUN: 25 mg/dL — ABNORMAL HIGH (ref 6–23)
CO2: 27 mEq/L (ref 19–32)
Calcium: 12.1 mg/dL — ABNORMAL HIGH (ref 8.4–10.5)
Chloride: 88 mEq/L — ABNORMAL LOW (ref 96–112)
Creatinine, Ser: 1.34 mg/dL — ABNORMAL HIGH (ref 0.40–1.20)
GFR: 45.57 mL/min — ABNORMAL LOW (ref >60.00)
Glucose, Bld: 95 mg/dL (ref 70–99)
Potassium: 2.9 mEq/L — ABNORMAL LOW (ref 3.5–5.1)
Sodium: 129 mEq/L — ABNORMAL LOW (ref 135–145)
Total Bilirubin: 0.7 mg/dL (ref 0.2–1.2)
Total Protein: 8.1 g/dL (ref 6.0–8.3)

## 2016-09-22 LAB — CBC WITH DIFFERENTIAL/PLATELET
Basophils Absolute: 0 10*3/uL (ref 0.0–0.1)
Basophils Relative: 0.6 % (ref 0.0–3.0)
Eosinophils Absolute: 0.2 10*3/uL (ref 0.0–0.7)
Eosinophils Relative: 2.3 % (ref 0.0–5.0)
HCT: 42.6 % (ref 36.0–46.0)
Hemoglobin: 14.6 g/dL (ref 12.0–15.0)
Lymphocytes Relative: 29 % (ref 12.0–46.0)
Lymphs Abs: 2.2 10*3/uL (ref 0.7–4.0)
MCHC: 34.4 g/dL (ref 30.0–36.0)
MCV: 92.7 fl (ref 78.0–100.0)
Monocytes Absolute: 0.9 10*3/uL (ref 0.1–1.0)
Monocytes Relative: 11.9 % (ref 3.0–12.0)
Neutro Abs: 4.4 10*3/uL (ref 1.4–7.7)
Neutrophils Relative %: 56.2 % (ref 43.0–77.0)
Platelets: 261 10*3/uL (ref 150.0–400.0)
RBC: 4.59 Mil/uL (ref 3.87–5.11)
RDW: 14.3 % (ref 11.5–15.5)
WBC: 7.8 10*3/uL (ref 4.0–10.5)

## 2016-09-22 LAB — AMYLASE: Amylase: 52 U/L (ref 27–131)

## 2016-09-22 LAB — LIPASE: Lipase: 83 U/L — ABNORMAL HIGH (ref 11.0–59.0)

## 2016-09-22 MED ORDER — PROMETHAZINE HCL 25 MG PO TABS: 25.0000 mg | ORAL_TABLET | Freq: Three times a day (TID) | ORAL | 1 refills | Status: DC | PRN

## 2016-09-22 MED ORDER — OMEPRAZOLE 20 MG PO CPDR: 20.0000 mg | DELAYED_RELEASE_CAPSULE | Freq: Every day | ORAL | 5 refills | Status: DC

## 2016-09-22 MED ORDER — AMLODIPINE BESYLATE 5 MG PO TABS: 5.0000 mg | ORAL_TABLET | Freq: Every day | ORAL | 5 refills | Status: DC

## 2016-09-22 MED ORDER — PROMETHAZINE HCL 25 MG/ML IJ SOLN
50.0000 mg | Freq: Once | INTRAMUSCULAR | Status: AC
  Administered 2016-09-22: 50 mg via INTRAMUSCULAR

## 2016-09-22 MED ORDER — HYDROCHLOROTHIAZIDE 25 MG PO TABS: 25.0000 mg | ORAL_TABLET | Freq: Every day | ORAL | 5 refills | Status: DC

## 2016-09-22 MED ORDER — DICYCLOMINE HCL 20 MG PO TABS: 20.0000 mg | ORAL_TABLET | Freq: Two times a day (BID) | ORAL | 0 refills | Status: DC | PRN

## 2016-09-22 MED ORDER — LISINOPRIL 10 MG PO TABS: 10.0000 mg | ORAL_TABLET | Freq: Every day | ORAL | 5 refills | Status: DC
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
## 2016-09-22 NOTE — Assessment & Plan Note (Signed)
Severe stressors with marital issues and chronic pelvic pain Requests counseling referral -done  Has support (father) States she is not in physical danger Reviewed stressors/ coping techniques/symptoms/ support sources/ tx options and side effects in detail today

## 2016-09-22 NOTE — Assessment & Plan Note (Signed)
Ongoing- plans to return to her gyn

## 2016-09-22 NOTE — Telephone Encounter (Signed)
I will see her then  

## 2016-09-22 NOTE — Telephone Encounter (Signed)
Pt has appt with Dr Milinda Antis 09/22/16 at 2:15.

## 2016-09-22 NOTE — Patient Instructions (Addendum)
I refilled your blood pressure medicines- but hold the hctz (fluid pill) until you have stopped vomiting for at least 3 days   Phenergan shot today for nausea  Sip fluids as much as possible to re hydrate  You can take the phenergan pills every 8 hours  The dicyclomine may help abdominal cramping- use as directed  Take the omeprazole one pill daily to help with acid in your stomach   We will do a counseling referral   If symptoms worsen- get to the ER so you can get some fluids   Continue to avoid alcohol entirely   Labs today   We will order an abdominal ultrasound as well

## 2016-09-22 NOTE — Assessment & Plan Note (Signed)
Refilled hctz (she will hold this until not vomiting however) Amlodipine and lisinopril Lab today  Plan f/u when GI issue is improved

## 2016-09-22 NOTE — Telephone Encounter (Signed)
[facility] Day - Client TELEPHONE ADVICE RECORD [facility]  Patient Name: Nicole Hughes  DOB: 17-Nov-1970    Initial Comment Caller states she has been vomiting all weekend. Low grade fever.    Nurse Assessment  Nurse: Odis Luster, RN, Bjorn Loser Date/Time (Eastern Time): 09/22/2016 8:38:08 AM  Confirm and document reason for call. If symptomatic, describe symptoms. ---Caller states she has been vomiting all weekend. Low grade fever. Stools are loose.  Does the patient have any new or worsening symptoms? ---Yes  Will a triage be completed? ---Yes  Related visit to physician within the last 2 weeks? ---No  Does the PT have any chronic conditions? (i.e. diabetes, asthma, etc.) ---Yes  List chronic conditions. ---HTN  Is the patient pregnant or possibly pregnant? (Ask all females between the ages of 58-55) ---No  Is this a behavioral health or substance abuse call? ---No     Guidelines    Guideline Title Affirmed Question Affirmed Notes  Vomiting [1] SEVERE vomiting (e.g., 6 or more times/day) AND [2] present > 8 hours    Final Disposition User   Go to ED Now (or PCP triage) Odis Luster, RN, Rhonda    Comments  Caller refused to go to ED but wants to be seen in the MD office this afternoon. MD appt made with Dr. Milinda Antis at 2:15pm at the [facility] location.   Referrals  REFERRED TO PCP OFFICE   Disagree/Comply: Disagree  Disagree/Comply Reason: Wait and see

## 2016-09-22 NOTE — Assessment & Plan Note (Addendum)
Worst in RUQ  With n/v  If worse- will go to ED for fluids and urgent eval  Hx of pancreatitis and etoh abuse in the past  Will start back on ppi Dicyclomine for cramping  Phenergan for nausea  Lab - cmet/cbc/amy/lip today  Also ordered US of abdomen   Stressors add to symptoms- urgent counseling ref made \  Has been to ED several times Reviewed hospital records, lab results and studies in detail

## 2016-09-22 NOTE — Assessment & Plan Note (Signed)
Pt states she quit etoh 2 weeks ago - had mild w/d symptoms but no seizures  Does not feel like she needs rehab at this time and plans to stay off of it

## 2016-09-22 NOTE — Assessment & Plan Note (Signed)
Ongoing nausea and upper abd pain are worse  Refilled omeprazole 20 mg

## 2016-09-23 ENCOUNTER — Telehealth: Payer: Self-pay | Admitting: *Deleted

## 2016-09-23 MED ORDER — POTASSIUM CHLORIDE CRYS ER 20 MEQ PO TBCR: 20.0000 meq | EXTENDED_RELEASE_TABLET | Freq: Three times a day (TID) | ORAL | 0 refills | Status: DC

## 2016-09-23 NOTE — Telephone Encounter (Signed)
Rx sent to pharmacy   

## 2016-09-23 NOTE — Telephone Encounter (Signed)
-----   Message from Judy Pimple, MD sent at 09/23/2016  9:08 AM EDT ----- I spoke to pt- her vomiting has stopped/ she can keep fluids down  Feels like she may be able to eat and abdominal pain is gone She cannot go to ED due to finances and no transportation - would like to try to re hydrate at home and re check labs  If she can come for labs wed she will/otherwise Thursday am   Please call in [place] 20 meq 1 po tid with meals #20 no refills   ([facility] [place]) If she cannot keep this down she will to to ED If her n/v or abd pain return she will go to ED if needed by ambulance  Please schedule lab appt for wed or thurs depending when she can come for cmet   She will continue to refrain from etoh  Also no acetaminophen-she voiced understanding   She will alert [place] if symptoms worsen at all

## 2016-09-24 ENCOUNTER — Other Ambulatory Visit (INDEPENDENT_AMBULATORY_CARE_PROVIDER_SITE_OTHER): Payer: 59

## 2016-09-24 ENCOUNTER — Telehealth (INDEPENDENT_AMBULATORY_CARE_PROVIDER_SITE_OTHER): Payer: 59 | Admitting: Family Medicine

## 2016-09-24 DIAGNOSIS — K859 Acute pancreatitis without necrosis or infection, unspecified: Secondary | ICD-10-CM

## 2016-09-24 DIAGNOSIS — E876 Hypokalemia: Secondary | ICD-10-CM | POA: Diagnosis not present

## 2016-09-24 NOTE — Telephone Encounter (Signed)
Lab order for today

## 2016-09-25 ENCOUNTER — Ambulatory Visit
Admission: RE | Admit: 2016-09-25 | Discharge: 2016-09-25 | Disposition: A | Discharge status: Home / Self Care | Payer: 59 | Source: Ambulatory Visit | Attending: Family Medicine | Admitting: Family Medicine

## 2016-09-25 DIAGNOSIS — R1011 Right upper quadrant pain: Secondary | ICD-10-CM

## 2016-09-25 DIAGNOSIS — K7689 Other specified diseases of liver: Secondary | ICD-10-CM | POA: Diagnosis not present

## 2016-09-25 LAB — COMPREHENSIVE METABOLIC PANEL
ALT: 194 U/L — ABNORMAL HIGH (ref 0–35)
AST: 110 U/L — ABNORMAL HIGH (ref 0–37)
Albumin: 4 g/dL (ref 3.5–5.2)
Alkaline Phosphatase: 30 U/L — ABNORMAL LOW (ref 39–117)
BUN: 14 mg/dL (ref 6–23)
CO2: 27 mEq/L (ref 19–32)
Calcium: 9.3 mg/dL (ref 8.4–10.5)
Chloride: 93 mEq/L — ABNORMAL LOW (ref 96–112)
Creatinine, Ser: 0.89 mg/dL (ref 0.40–1.20)
GFR: 73.07 mL/min (ref >60.00)
Glucose, Bld: 85 mg/dL (ref 70–99)
Potassium: 3.9 mEq/L (ref 3.5–5.1)
Sodium: 127 mEq/L — ABNORMAL LOW (ref 135–145)
Total Bilirubin: 0.3 mg/dL (ref 0.2–1.2)
Total Protein: 6.3 g/dL (ref 6.0–8.3)

## 2016-09-25 LAB — LIPASE: Lipase: 107 U/L — ABNORMAL HIGH (ref 11.0–59.0)

## 2016-10-03 ENCOUNTER — Ambulatory Visit: Payer: Self-pay | Admitting: Family Medicine

## 2016-10-10 ENCOUNTER — Ambulatory Visit: Payer: 59 | Admitting: Psychology

## 2017-01-02 ENCOUNTER — Emergency Department (HOSPITAL_COMMUNITY): Payer: 59

## 2017-01-02 ENCOUNTER — Encounter (HOSPITAL_COMMUNITY): Payer: Self-pay | Admitting: Emergency Medicine

## 2017-01-02 ENCOUNTER — Emergency Department (HOSPITAL_COMMUNITY)
Admission: EM | Admit: 2017-01-02 | Discharge: 2017-01-03 | Disposition: A | Discharge status: Home / Self Care | Payer: 59 | Attending: Emergency Medicine | Admitting: Emergency Medicine

## 2017-01-02 DIAGNOSIS — Z79899 Other long term (current) drug therapy: Secondary | ICD-10-CM | POA: Diagnosis not present

## 2017-01-02 DIAGNOSIS — I10 Essential (primary) hypertension: Secondary | ICD-10-CM

## 2017-01-02 DIAGNOSIS — I700 Atherosclerosis of aorta: Secondary | ICD-10-CM | POA: Diagnosis not present

## 2017-01-02 DIAGNOSIS — R109 Unspecified abdominal pain: Secondary | ICD-10-CM

## 2017-01-02 DIAGNOSIS — R5383 Other fatigue: Secondary | ICD-10-CM | POA: Insufficient documentation

## 2017-01-02 DIAGNOSIS — R1084 Generalized abdominal pain: Secondary | ICD-10-CM | POA: Insufficient documentation

## 2017-01-02 DIAGNOSIS — N390 Urinary tract infection, site not specified: Secondary | ICD-10-CM | POA: Insufficient documentation

## 2017-01-02 DIAGNOSIS — R110 Nausea: Secondary | ICD-10-CM | POA: Diagnosis not present

## 2017-01-02 DIAGNOSIS — R8281 Pyuria: Secondary | ICD-10-CM

## 2017-01-02 LAB — I-STAT CHEM 8, ED
BUN: 3 mg/dL — ABNORMAL LOW (ref 6–20)
Calcium, Ion: 1.05 mmol/L — ABNORMAL LOW (ref 1.15–1.40)
Chloride: 101 mmol/L (ref 101–111)
Creatinine, Ser: 0.5 mg/dL (ref 0.44–1.00)
Glucose, Bld: 70 mg/dL (ref 65–99)
HCT: 43 % (ref 36.0–46.0)
Hemoglobin: 14.6 g/dL (ref 12.0–15.0)
Potassium: 3 mmol/L — ABNORMAL LOW (ref 3.5–5.1)
Sodium: 139 mmol/L (ref 135–145)
TCO2: 17 mmol/L — ABNORMAL LOW (ref 22–32)

## 2017-01-02 LAB — COMPREHENSIVE METABOLIC PANEL
ALT: 106 U/L — ABNORMAL HIGH (ref 14–54)
AST: 125 U/L — ABNORMAL HIGH (ref 15–41)
Albumin: 5 g/dL (ref 3.5–5.0)
Alkaline Phosphatase: 46 U/L (ref 38–126)
Anion gap: 20 — ABNORMAL HIGH (ref 5–15)
BUN: <5 mg/dL — ABNORMAL LOW (ref 6–20)
CO2: 17 mmol/L — ABNORMAL LOW (ref 22–32)
Calcium: 9.5 mg/dL (ref 8.9–10.3)
Chloride: 99 mmol/L — ABNORMAL LOW (ref 101–111)
Creatinine, Ser: 0.63 mg/dL (ref 0.44–1.00)
GFR calc Af Amer: >60 mL/min (ref >60)
GFR calc non Af Amer: >60 mL/min (ref >60)
Glucose, Bld: 68 mg/dL (ref 65–99)
Potassium: 3 mmol/L — ABNORMAL LOW (ref 3.5–5.1)
Sodium: 136 mmol/L (ref 135–145)
Total Bilirubin: 0.9 mg/dL (ref 0.3–1.2)
Total Protein: 7.8 g/dL (ref 6.5–8.1)

## 2017-01-02 LAB — URINALYSIS, ROUTINE W REFLEX MICROSCOPIC
Bilirubin Urine: NEGATIVE
Glucose, UA: NEGATIVE mg/dL
Ketones, ur: 80 mg/dL — AB
Leukocytes, UA: NEGATIVE
Nitrite: NEGATIVE
Protein, ur: NEGATIVE mg/dL
Specific Gravity, Urine: 1.01 (ref 1.005–1.030)
pH: 6 (ref 5.0–8.0)

## 2017-01-02 LAB — I-STAT BETA HCG BLOOD, ED (MC, WL, AP ONLY): I-stat hCG, quantitative: <5 m[IU]/mL (ref <5)

## 2017-01-02 LAB — LIPASE, BLOOD: Lipase: 45 U/L (ref 11–51)

## 2017-01-02 MED ORDER — ONDANSETRON 4 MG PO TBDP: 4.0000 mg | ORAL_TABLET | Freq: Once | ORAL | Status: DC

## 2017-01-02 MED ORDER — IBUPROFEN 600 MG PO TABS: 600.0000 mg | ORAL_TABLET | Freq: Four times a day (QID) | ORAL | 0 refills | Status: DC | PRN

## 2017-01-02 MED ORDER — HYDROCODONE-ACETAMINOPHEN 5-325 MG PO TABS: 1.0000 | ORAL_TABLET | ORAL | 0 refills | Status: DC | PRN

## 2017-01-02 MED ORDER — LACTATED RINGERS IV BOLUS (SEPSIS)
1000.0000 mL | Freq: Once | INTRAVENOUS | Status: AC
  Administered 2017-01-02: 1000 mL via INTRAVENOUS

## 2017-01-02 MED ORDER — HYDROMORPHONE HCL 1 MG/ML IJ SOLN
1.0000 mg | Freq: Once | INTRAMUSCULAR | Status: AC
  Administered 2017-01-02: 1 mg via INTRAVENOUS
  Filled 2017-01-02: qty 1

## 2017-01-02 MED ORDER — SODIUM CHLORIDE 0.9 % IV BOLUS (SEPSIS)
1000.0000 mL | Freq: Once | INTRAVENOUS | Status: AC
  Administered 2017-01-02: 1000 mL via INTRAVENOUS

## 2017-01-02 MED ORDER — LABETALOL HCL 5 MG/ML IV SOLN
10.0000 mg | Freq: Once | INTRAVENOUS | Status: AC
  Administered 2017-01-02: 10 mg via INTRAVENOUS
  Filled 2017-01-02: qty 4

## 2017-01-02 MED ORDER — ONDANSETRON 4 MG PO TBDP
8.0000 mg | ORAL_TABLET | Freq: Once | ORAL | Status: AC
Start: 2017-01-02 — End: 2017-01-02
  Administered 2017-01-02: 8 mg via ORAL
  Filled 2017-01-02: qty 2

## 2017-01-02 MED ORDER — CEPHALEXIN 500 MG PO CAPS: 500.0000 mg | ORAL_CAPSULE | Freq: Four times a day (QID) | ORAL | 0 refills | Status: AC

## 2017-01-02 MED ORDER — ONDANSETRON 4 MG PO TBDP: 4.0000 mg | ORAL_TABLET | Freq: Three times a day (TID) | ORAL | 0 refills | Status: DC | PRN

## 2017-01-02 MED ORDER — AMLODIPINE BESYLATE 5 MG PO TABS
5.0000 mg | ORAL_TABLET | Freq: Once | ORAL | Status: AC
  Administered 2017-01-03: 5 mg via ORAL
  Filled 2017-01-02: qty 1

## 2017-01-02 MED ORDER — KETOROLAC TROMETHAMINE 15 MG/ML IJ SOLN
15.0000 mg | Freq: Once | INTRAMUSCULAR | Status: AC
  Administered 2017-01-02: 15 mg via INTRAVENOUS
  Filled 2017-01-02: qty 1

## 2017-01-02 MED ORDER — OXYCODONE-ACETAMINOPHEN 5-325 MG PO TABS
2.0000 | ORAL_TABLET | Freq: Once | ORAL | Status: AC
  Administered 2017-01-02: 2 via ORAL
  Filled 2017-01-02: qty 2

## 2017-01-02 MED ORDER — DEXTROSE 5 % IV SOLN
1.0000 g | Freq: Once | INTRAVENOUS | Status: AC
  Administered 2017-01-02: 1 g via INTRAVENOUS
  Filled 2017-01-02: qty 10

## 2017-01-02 MED ORDER — IOPAMIDOL (ISOVUE-370) INJECTION 76%
INTRAVENOUS | Status: AC
  Administered 2017-01-02: 100 mL
  Filled 2017-01-02: qty 100

## 2017-01-02 MED ORDER — ONDANSETRON HCL 4 MG/2ML IJ SOLN
4.0000 mg | Freq: Once | INTRAMUSCULAR | Status: AC
  Administered 2017-01-02: 4 mg via INTRAVENOUS
  Filled 2017-01-02: qty 2

## 2017-01-02 NOTE — Discharge Instructions (Addendum)
Please take your antibiotics for your urinary tract infection.  Please also take your home medications for your blood pressure.  We are discharging you with pain and nausea medicine as well.  Please follow-up with your primary care physician.  Your CT scans today were normal.

## 2017-01-02 NOTE — ED Triage Notes (Signed)
Pt states right sided flank pain since Sunday with increased urination. Pt grimacing in triage and appears in a lot of pain.

## 2017-01-02 NOTE — ED Notes (Signed)
ED Provider at bedside. 

## 2017-01-02 NOTE — ED Notes (Signed)
Patient transported to CT 

## 2017-01-02 NOTE — ED Provider Notes (Signed)
[name]   CSN: [id number] Arrival date & time: 01/02/17  1325     History   Chief Complaint Chief Complaint  Patient presents with  . Flank Pain    HPI Nicole Hughes is a 46 y.o. female.  HPI   46 year old female with past medical history as below including history of PE, cardiomyopathy, chronic smoking, who presents with flank pain. Pt reports sx started intermittently 2 days ago as intermittent sharp, aching, gnawing R flank pain with associated nausea. She's had no vomiting. Pain is worse with movement and palpation. No alleviating factors.  She has a h/o pyelonephritis but denies any dysuria, frequency, urgency. No vaginal bleeding or discharge. No h/o kidney stones. No h/o aneurysm or aortic disease. Denies cocaine or other drug use.  Past Medical History:  Diagnosis Date  . Cardiomyopathy (HCC)   . Complication of anesthesia    "hard to get under"  . Cyst of ovary   . Dysmenorrhea   . Dyspareunia   . 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 History of blood clots   . History of chicken pox   . History of depression   . History of hay fever   . History of migraine    and frequent Headaches  . Hypertension   . Pelvic pain   . Pulmonary embolism [facility](HCC)     Patient Active Problem List   Diagnosis Date Noted  . Hypokalemia 09/24/2016  . Pancreatitis 09/24/2016  . Abdominal pain 09/22/2016  . H/O alcohol abuse 09/22/2016  . H/O acute pancreatitis 09/22/2016  . GERD (gastroesophageal reflux disease) 04/09/2015  . Abdominal pain, epigastric 01/17/2014  . 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 Stress reaction 07/23/2012  . Essential hypertension 06/09/2012  . History of pulmonary embolism 06/09/2012  . Preop cardiovascular exam 06/08/2012  . Dysmenorrhea   . Dyspareunia   . Pelvic pain   . Cardiomyopathy (HCC)   . Cyst of ovary     Past Surgical History:  Procedure Laterality  Date  . ABDOMINAL EXPLORATION SURGERY    . CESAREAN SECTION  1994  . ECTOPIC PREGNANCY SURGERY    . LAPAROSCOPY N/A 09/24/2012   Procedure: OPERATIVE LAPROSCOPY WITH LYSIS OF ADHESIONS;  Surgeon: Sherron MondayJody [name], MD;  Location: [facility] ORS;  Service: Gynecology;  Laterality: N/A;  . TUBAL LIGATION  2010    OB History    No data available       Home Medications    Prior to Admission medications   Medication Sig Start Date End Date Taking? Authorizing Provider  amLODipine (NORVASC) 5 MG tablet Take 1 tablet (5 mg total) by mouth daily. 09/22/16   Tower, Audrie GallusMarne A, MD  dicyclomine (BENTYL) 20 MG tablet Take 1 tablet (20 mg total) by mouth 2 (two) times daily as needed for spasms. As needed for abdominal cramping 09/22/16   Tower, Audrie GallusMarne A, MD  hydrochlorothiazide (HYDRODIURIL) 25 MG tablet Take 1 tablet (25 mg total) by mouth daily. 09/22/16   Tower, Audrie GallusMarne A, MD  lisinopril (PRINIVIL,ZESTRIL) 10 MG tablet Take 1 tablet (10 mg total) by mouth daily. 09/22/16   Tower, Audrie GallusMarne A, MD  omeprazole (PRILOSEC) 20 MG capsule Take 1 capsule (20 mg total) by mouth daily. 09/22/16   Tower, Audrie GallusMarne A, MD  potassium chloride SA (K-DUR,KLOR-CON) 20 MEQ tablet Take 1 tablet (20 mEq total) by mouth 3 (three) times daily. 09/23/16   Tower, Audrie GallusMarne A, MD  promethazine (PHENERGAN) 25 MG tablet  Take 1 tablet (25 mg total) by mouth every 8 (eight) hours as needed for nausea or vomiting. Caution of sedation 09/22/16   Tower, Audrie Gallus, MD    Family History Family History  Problem Relation Age of Onset  . Cancer - Lung Mother   . Heart failure Father   . Hypertension Father     Social History Social History   Tobacco Use  . Smoking status: Never Smoker  . Smokeless tobacco: Never Used  Substance Use Topics  . Alcohol use: No    Alcohol/week: 0.0 oz    Comment: occ  . Drug use: No     Allergies   Patient has no known allergies.   Review of Systems Review of Systems  Constitutional: Positive for fatigue.    Gastrointestinal: Positive for abdominal pain and nausea.  Genitourinary: Positive for flank pain.  All other systems reviewed and are negative.    Physical Exam Updated Vital Signs BP (!) 177/108   Pulse 99   Temp (S) 97.9 F (36.6 C) (Oral)   Resp (!) 26   LMP 01/02/2017   SpO2 100%   Physical Exam  Constitutional: She is oriented to person, place, and time. She appears well-developed and well-nourished. She appears distressed (appears uncomfortable).  HENT:  Head: Normocephalic and atraumatic.  Eyes: Conjunctivae are normal.  Neck: Neck supple.  Cardiovascular: Normal rate, regular rhythm and normal heart sounds. Exam reveals no friction rub.  No murmur heard. 2+ DP/PT pulses b/l, 2+ radial  Pulmonary/Chest: Effort normal and breath sounds normal. No respiratory distress. She has no wheezes. She has no rales.  Abdominal: Soft. She exhibits no distension. There is tenderness (moderate R flank TTP, CVAT).  Musculoskeletal: She exhibits no edema.  Neurological: She is alert and oriented to person, place, and time. She exhibits normal muscle tone.  Skin: Skin is warm. Capillary refill takes less than 2 seconds.  Psychiatric: She has a normal mood and affect.  Nursing note and vitals reviewed.    ED Treatments / Results  Labs (all labs ordered are listed, but only abnormal results are displayed) Labs Reviewed  URINALYSIS, ROUTINE W REFLEX MICROSCOPIC - Abnormal; Notable for the following components:      Result Value   Hgb urine dipstick SMALL (*)    Ketones, ur 80 (*)    Bacteria, UA RARE (*)    Squamous Epithelial / LPF 0-5 (*)    All other components within normal limits  COMPREHENSIVE METABOLIC PANEL - Abnormal; Notable for the following components:   Potassium 3.0 (*)    Chloride 99 (*)    CO2 17 (*)    BUN <5 (*)    AST 125 (*)    ALT 106 (*)    Anion gap 20 (*)    All other components within normal limits  I-STAT CHEM 8, ED - Abnormal; Notable for the  following components:   Potassium 3.0 (*)    BUN 3 (*)    Calcium, Ion 1.05 (*)    TCO2 17 (*)    All other components within normal limits  URINE CULTURE  LIPASE, BLOOD  CBC  I-STAT BETA HCG BLOOD, ED ([facility], [facility], [facility] ONLY)  I-STAT BETA HCG BLOOD, ED ([facility], [facility], [facility] ONLY)    EKG  EKG Interpretation None       Radiology Ct Renal Stone Study  Result Date: 01/02/2017 CLINICAL DATA:  46 year old female with right flank pain. EXAM: CT ABDOMEN AND PELVIS WITHOUT CONTRAST TECHNIQUE: Multidetector  CT imaging of the abdomen and pelvis was performed following the standard protocol without IV contrast. COMPARISON:  Abdominal ultrasound dated 09/25/2016. FINDINGS: Lower chest: The visualized lung bases are clear. No intra-abdominal free air or free fluid. Hepatobiliary: Diffuse fatty infiltration of the liver. No intrahepatic biliary ductal dilatation. The gallbladder is unremarkable. Pancreas: Unremarkable. No pancreatic ductal dilatation or surrounding inflammatory changes. Spleen: Normal in size without focal abnormality. Adrenals/Urinary Tract: Adrenal glands are unremarkable. Kidneys are normal, without renal calculi, focal lesion, or hydronephrosis. Bladder is unremarkable. Stomach/Bowel: Stomach is within normal limits. Appendix appears normal. No evidence of bowel wall thickening, distention, or inflammatory changes. Vascular/Lymphatic: Mild aortoiliac atherosclerotic disease. The abdominal aorta and IVC are otherwise unremarkable on this noncontrast CT. No portal venous gas. There is no adenopathy. Reproductive: The uterus and ovaries are grossly unremarkable. Other: None Musculoskeletal: No acute or significant osseous findings. IMPRESSION: 1. No acute intra-abdominopelvic pathology. No hydronephrosis or nephrolithiasis. 2. Advanced fatty liver. 3. No bowel obstruction or active inflammation.  Normal appendix. 4. Mild Aortic Atherosclerosis (ICD10-I70.0). Electronically Signed   By: Elgie CollardArash  Radparvar  M.D.   On: 01/02/2017 22:40    Procedures Procedures (including critical care time)  Medications Ordered in ED Medications  lactated ringers bolus 1,000 mL (1,000 mLs Intravenous New Bag/Given 01/02/17 2317)  iopamidol (ISOVUE-370) 76 % injection (not administered)  oxyCODONE-acetaminophen (PERCOCET/ROXICET) 5-325 MG per tablet 2 tablet (2 tablets Oral Given 01/02/17 1400)  ondansetron (ZOFRAN-ODT) disintegrating tablet 8 mg (8 mg Oral Given 01/02/17 1400)  sodium chloride 0.9 % bolus 1,000 mL (0 mLs Intravenous Stopped 01/02/17 2207)  HYDROmorphone (DILAUDID) injection 1 mg (1 mg Intravenous Given 01/02/17 2138)  ondansetron (ZOFRAN) injection 4 mg (4 mg Intravenous Given 01/02/17 2138)  ketorolac (TORADOL) 15 MG/ML injection 15 mg (15 mg Intravenous Given 01/02/17 2138)  sodium chloride 0.9 % bolus 1,000 mL (0 mLs Intravenous Stopped 01/02/17 2318)  cefTRIAXone (ROCEPHIN) 1 g in dextrose 5 % 50 mL IVPB (0 g Intravenous Stopped 01/02/17 2258)  HYDROmorphone (DILAUDID) injection 1 mg (1 mg Intravenous Given 01/02/17 2302)  labetalol (NORMODYNE,TRANDATE) injection 10 mg (10 mg Intravenous Given 01/02/17 2300)     Initial Impression / Assessment and Plan / ED Course  I have reviewed the triage vital signs and the nursing notes.  Pertinent labs & imaging results that were available during my care of the patient were reviewed by me and considered in my medical decision making (see chart for details).     46 yo F with PMHx as above here with severe R flank pain, nausea, vomiting. UA + pyuria, lab work shows significant dehydration with ketonuria, CO2 17. K 3.0, will replete. No AKI. CT stone study ordered and is negative for stone.  While sx are possibly 2/2 pyelonephritis with renal spasm, must also consider AAA/dissection given severity, marked HTN. Will obtain aorta scan, continue fluids/pain control, and re-assess. Of note, LFTs elevated - this appears to be chronic. Will need outpt  follow-up.  Plan to f/u CT Angio, re-assess sx. Patient care transferred to Dr. Elesa MassedWard at the end of my shift. Patient presentation, ED course, and plan of care discussed with review of all pertinent labs and imaging. Please see his/her note for further details regarding further ED course and disposition.   Final Clinical Impressions(s) / ED Diagnoses   Final diagnoses:  Flank pain       Shaune PollackIsaacs, Jahzara Slattery, MD 01/02/17 2325

## 2017-01-02 NOTE — ED Provider Notes (Signed)
Patient placed in Quick Look pathway, seen and evaluated for chief complaint of R flank pain.  Pertinent H&P findings include intermittent almost 1 week, nausea, subjective fever, sweats., feels like previous pyelo, no hx stones.  Based on initial evaluation, labs are indicated and radiology studies are not indicated.  Patient counseled on process, plan, and necessity for staying for completing the evaluation    Arthor CaptainHarris, Maliya Marich, PA-C 01/02/17 1402    Margarita Grizzleay, Danielle, MD 01/04/17 1253

## 2017-01-03 MED ORDER — ONDANSETRON HCL 4 MG/2ML IJ SOLN
4.0000 mg | Freq: Once | INTRAMUSCULAR | Status: AC
  Administered 2017-01-03: 4 mg via INTRAVENOUS
  Filled 2017-01-03: qty 2

## 2017-01-03 MED ORDER — HYDROMORPHONE HCL 1 MG/ML IJ SOLN
1.0000 mg | Freq: Once | INTRAMUSCULAR | Status: AC
  Administered 2017-01-03: 1 mg via INTRAVENOUS
  Filled 2017-01-03: qty 1

## 2017-01-03 MED ORDER — LISINOPRIL 10 MG PO TABS
10.0000 mg | ORAL_TABLET | Freq: Once | ORAL | Status: AC
  Administered 2017-01-03: 10 mg via ORAL
  Filled 2017-01-03: qty 1

## 2017-01-03 NOTE — ED Provider Notes (Signed)
12:00 AM  Assumed care from Dr. Penne LashIssacs.  Patient is a 46 year old female with history of pyelonephritis, hypertension who presented to the emergency department with right flank pain, nausea and vomiting.  Patient appears uncomfortable here and is hypertensive.  Renal CT was negative for stone or hydronephrosis.  She will have a CT dissection study to rule out dissection.  Urine shows 6-30 white blood cells and rare bacteria but she is being treated for possible UTI given her history of [place].  Culture pending.  Received Rocephin here.  She is hypertensive but does report vomiting her blood pressure medication up at home.  Has been given her Norvasc here.  Once CT scan is back if it is normal she will get her home dose of lisinopril as well.   1:05 AM  Pt's CT shows no dissection or aneurysm.  It does not show any acute abnormality.  Blood pressure is slowly improving after home medications.  She reports her pain is improving and she is comfortable with the plan for discharge home.  Will give another round of pain and nausea medicines before discharge.  She is not driving.  She has been discharged with prescription of Keflex, Zofran, Vicodin and ibuprofen.  She has a PCP for follow-up.  She is being treated as if this could be possible early pyelonephritis.  She will continue her home blood pressure medications at home.   At this time, I do not feel there is any life-threatening condition present. I have reviewed and discussed all results (EKG, imaging, lab, urine as appropriate) and exam findings with patient/family. I have reviewed nursing notes and appropriate previous records.  I feel the patient is safe to be discharged home without further emergent workup and can continue workup as an outpatient as needed. Discussed usual and customary return precautions. Patient/family verbalize understanding and are comfortable with this plan.  Outpatient follow-up has been provided if needed. All questions have been  answered.     Ward, Layla MawKristen N, DO 01/03/17 [phone]

## 2017-01-04 LAB — URINE CULTURE

## 2017-01-05 ENCOUNTER — Telehealth: Payer: Self-pay | Admitting: *Deleted

## 2017-01-05 NOTE — Telephone Encounter (Signed)
Lm requesting return call to complete TCM and confirm hosp f/u appt   If pt contacts office back please schedule with Dr Milinda Antisower 1/2 @12pm . Ok per Dr Milinda Antisower; pt must be seen within 3 days

## 2017-01-07 NOTE — Telephone Encounter (Signed)
Lm requesting return call to complete TCM and confirm hosp f/u appt  appt is no longer being held as we were unable to contact pt

## 2017-02-16 ENCOUNTER — Emergency Department (HOSPITAL_COMMUNITY): Payer: 59

## 2017-02-16 ENCOUNTER — Encounter (HOSPITAL_COMMUNITY): Payer: Self-pay

## 2017-02-16 ENCOUNTER — Other Ambulatory Visit: Payer: Self-pay

## 2017-02-16 ENCOUNTER — Inpatient Hospital Stay (HOSPITAL_COMMUNITY)
Admission: EM | Admit: 2017-02-16 | Discharge: 2017-02-18 | DRG: 897 | Disposition: A | Discharge status: Home / Self Care | Payer: 59 | Attending: Internal Medicine | Admitting: Internal Medicine

## 2017-02-16 DIAGNOSIS — F10230 Alcohol dependence with withdrawal, uncomplicated: Secondary | ICD-10-CM | POA: Diagnosis not present

## 2017-02-16 DIAGNOSIS — R079 Chest pain, unspecified: Secondary | ICD-10-CM

## 2017-02-16 DIAGNOSIS — E876 Hypokalemia: Secondary | ICD-10-CM | POA: Diagnosis not present

## 2017-02-16 DIAGNOSIS — I10 Essential (primary) hypertension: Secondary | ICD-10-CM | POA: Diagnosis present

## 2017-02-16 DIAGNOSIS — F10939 Alcohol use, unspecified with withdrawal, unspecified: Secondary | ICD-10-CM | POA: Diagnosis present

## 2017-02-16 DIAGNOSIS — Z8249 Family history of ischemic heart disease and other diseases of the circulatory system: Secondary | ICD-10-CM

## 2017-02-16 DIAGNOSIS — F1210 Cannabis abuse, uncomplicated: Secondary | ICD-10-CM | POA: Diagnosis present

## 2017-02-16 DIAGNOSIS — E871 Hypo-osmolality and hyponatremia: Secondary | ICD-10-CM | POA: Diagnosis present

## 2017-02-16 DIAGNOSIS — F10239 Alcohol dependence with withdrawal, unspecified: Secondary | ICD-10-CM | POA: Diagnosis present

## 2017-02-16 DIAGNOSIS — F1010 Alcohol abuse, uncomplicated: Secondary | ICD-10-CM

## 2017-02-16 DIAGNOSIS — K219 Gastro-esophageal reflux disease without esophagitis: Secondary | ICD-10-CM | POA: Diagnosis present

## 2017-02-16 DIAGNOSIS — N803 Endometriosis of pelvic peritoneum: Secondary | ICD-10-CM | POA: Diagnosis present

## 2017-02-16 DIAGNOSIS — I959 Hypotension, unspecified: Secondary | ICD-10-CM | POA: Diagnosis not present

## 2017-02-16 DIAGNOSIS — I429 Cardiomyopathy, unspecified: Secondary | ICD-10-CM | POA: Diagnosis present

## 2017-02-16 DIAGNOSIS — F10930 Alcohol use, unspecified with withdrawal, uncomplicated: Secondary | ICD-10-CM

## 2017-02-16 DIAGNOSIS — Z86718 Personal history of other venous thrombosis and embolism: Secondary | ICD-10-CM

## 2017-02-16 DIAGNOSIS — T424X5A Adverse effect of benzodiazepines, initial encounter: Secondary | ICD-10-CM | POA: Diagnosis not present

## 2017-02-16 DIAGNOSIS — Z23 Encounter for immunization: Secondary | ICD-10-CM

## 2017-02-16 DIAGNOSIS — Z86711 Personal history of pulmonary embolism: Secondary | ICD-10-CM

## 2017-02-16 DIAGNOSIS — R0789 Other chest pain: Secondary | ICD-10-CM | POA: Diagnosis not present

## 2017-02-16 HISTORY — DX: Major depressive disorder, single episode, unspecified: F32.9

## 2017-02-16 HISTORY — DX: Migraine, unspecified, not intractable, without status migrainosus: G43.909

## 2017-02-16 HISTORY — DX: Headache: R51

## 2017-02-16 HISTORY — DX: Allergic rhinitis due to pollen: J30.1

## 2017-02-16 HISTORY — DX: Low back pain, unspecified: M54.50

## 2017-02-16 HISTORY — DX: Headache, unspecified: R51.9

## 2017-02-16 HISTORY — DX: Depression, unspecified: F32.A

## 2017-02-16 HISTORY — DX: Other chronic pain: G89.29

## 2017-02-16 HISTORY — DX: Deep phlebothrombosis in pregnancy, unspecified trimester: O22.30

## 2017-02-16 HISTORY — DX: Gastro-esophageal reflux disease without esophagitis: K21.9

## 2017-02-16 HISTORY — DX: Anxiety disorder, unspecified: F41.9

## 2017-02-16 HISTORY — DX: Low back pain: M54.5

## 2017-02-16 LAB — CBC
HCT: 38.4 % (ref 36.0–46.0)
Hemoglobin: 13 g/dL (ref 12.0–15.0)
MCH: 29.7 pg (ref 26.0–34.0)
MCHC: 33.9 g/dL (ref 30.0–36.0)
MCV: 87.9 fL (ref 78.0–100.0)
Platelets: 265 10*3/uL (ref 150–400)
RBC: 4.37 MIL/uL (ref 3.87–5.11)
RDW: 13.5 % (ref 11.5–15.5)
WBC: 12.4 10*3/uL — ABNORMAL HIGH (ref 4.0–10.5)

## 2017-02-16 LAB — RAPID URINE DRUG SCREEN, HOSP PERFORMED
Amphetamines: NOT DETECTED
Barbiturates: NOT DETECTED
Benzodiazepines: NOT DETECTED
Cocaine: NOT DETECTED
Opiates: NOT DETECTED
Tetrahydrocannabinol: POSITIVE — AB

## 2017-02-16 LAB — COMPREHENSIVE METABOLIC PANEL
ALT: 63 U/L — ABNORMAL HIGH (ref 14–54)
AST: 69 U/L — ABNORMAL HIGH (ref 15–41)
Albumin: 4.6 g/dL (ref 3.5–5.0)
Alkaline Phosphatase: 44 U/L (ref 38–126)
Anion gap: 17 — ABNORMAL HIGH (ref 5–15)
BUN: 10 mg/dL (ref 6–20)
CO2: 22 mmol/L (ref 22–32)
Calcium: 9.2 mg/dL (ref 8.9–10.3)
Chloride: 90 mmol/L — ABNORMAL LOW (ref 101–111)
Creatinine, Ser: 0.93 mg/dL (ref 0.44–1.00)
GFR calc Af Amer: >60 mL/min (ref >60)
GFR calc non Af Amer: >60 mL/min (ref >60)
Glucose, Bld: 108 mg/dL — ABNORMAL HIGH (ref 65–99)
Potassium: 3.1 mmol/L — ABNORMAL LOW (ref 3.5–5.1)
Sodium: 129 mmol/L — ABNORMAL LOW (ref 135–145)
Total Bilirubin: 0.7 mg/dL (ref 0.3–1.2)
Total Protein: 7.4 g/dL (ref 6.5–8.1)

## 2017-02-16 LAB — ETHANOL: Alcohol, Ethyl (B): <10 mg/dL (ref <10)

## 2017-02-16 LAB — URINALYSIS, ROUTINE W REFLEX MICROSCOPIC
Bilirubin Urine: NEGATIVE
Glucose, UA: NEGATIVE mg/dL
Hgb urine dipstick: NEGATIVE
Ketones, ur: NEGATIVE mg/dL
Leukocytes, UA: NEGATIVE
Nitrite: NEGATIVE
Protein, ur: NEGATIVE mg/dL
Specific Gravity, Urine: 1.004 — ABNORMAL LOW (ref 1.005–1.030)
pH: 7 (ref 5.0–8.0)

## 2017-02-16 LAB — I-STAT TROPONIN, ED: Troponin i, poc: 0.01 ng/mL (ref 0.00–0.08)

## 2017-02-16 LAB — I-STAT BETA HCG BLOOD, ED (MC, WL, AP ONLY): I-stat hCG, quantitative: <5 m[IU]/mL (ref <5)

## 2017-02-16 MED ORDER — THIAMINE HCL 100 MG/ML IJ SOLN: 100.0000 mg | Freq: Every day | INTRAMUSCULAR | Status: DC

## 2017-02-16 MED ORDER — LORAZEPAM 1 MG PO TABS: 0.0000 mg | ORAL_TABLET | Freq: Four times a day (QID) | ORAL | Status: DC

## 2017-02-16 MED ORDER — LORAZEPAM 2 MG/ML IJ SOLN
0.0000 mg | Freq: Four times a day (QID) | INTRAMUSCULAR | Status: DC
  Administered 2017-02-16 – 2017-02-17 (×2): 2 mg via INTRAVENOUS
  Filled 2017-02-16 (×2): qty 1

## 2017-02-16 MED ORDER — VITAMIN B-1 100 MG PO TABS: 100.0000 mg | ORAL_TABLET | Freq: Every day | ORAL | Status: DC

## 2017-02-16 MED ORDER — LORAZEPAM 2 MG/ML IJ SOLN: 0.0000 mg | Freq: Two times a day (BID) | INTRAMUSCULAR | Status: DC

## 2017-02-16 MED ORDER — LORAZEPAM 1 MG PO TABS: 0.0000 mg | ORAL_TABLET | Freq: Two times a day (BID) | ORAL | Status: DC

## 2017-02-16 MED ORDER — SODIUM CHLORIDE 0.9 % IV BOLUS (SEPSIS)
1000.0000 mL | Freq: Once | INTRAVENOUS | Status: AC
  Administered 2017-02-16: 1000 mL via INTRAVENOUS

## 2017-02-16 NOTE — ED Triage Notes (Signed)
Patient from home after calling EMS for chest pain, initially refused transport but pain did not subside.  Pain in central chest and radiates to back and left neck.  States she quit drinking on Friday and only has had 1 or 2 beers since.  No heavy liquor.  A&Ox4 CIWA 8

## 2017-02-16 NOTE — ED Provider Notes (Signed)
[facility] EMERGENCY DEPARTMENT Provider Note   CSN: [id number] Arrival date & time: 02/16/17  1856     History   Chief Complaint Chief Complaint  Patient presents with  . Chest Pain  . Alcohol Problem    HPI Nicole Hughes is a 47 y.o. female with a hx of HTN, endometriosis, PE and cardiomyopathy after pregnancy in 2000 (on warfarin for 1 year, not currently anticoagulated) presents to the Emergency Department complaining of gradual, persistent, progressively worsening chest pain onset 5:30pm.  Pt reports the pain is left sided, 10/10 constant and radiating into her left shoulder and neck.  Pt reports she is attempting to stop drinking.  In an effort to stop, she completely stopped liquor on Sunday.  Pt reports she was drinking 3 large glasses of liquor everyday.  Pt reports she has changed to beer and is drinking 1-2 per night.  Pt reports no symptoms like this in the past.  Associated symptoms include nausea and vomiting onset approx 3 hours ago with 4 episodes of NBNB emesis.  Pt reports chronic abd pain which was present over the weekend, but is not currently present.  Pt reports she has never had a seizure from decreasing or stopping her EtOH intake.  She denies drug or tobacco usage.  Nothing makes it better and nothing makes it worse.  Pt denies OCP or estrogen, recent travel, leg swelling.  Pt reports she has mostly been in bed for the last few days.  She also reports cold sweats and generalized weakness, SOB, palpitations.  Pt denies fever, chills, neck stiffness, diarrhea, dizziness, syncope.  Pt reports taking ASA 324mg  PTA.     The history is provided by the patient and medical records. No language interpreter was used.    Past Medical History:  Diagnosis Date  . Cardiomyopathy (HCC)   . Complication of anesthesia    "hard to get under"  . Cyst of ovary   . Dysmenorrhea   . Dyspareunia   . 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 History of  blood clots   . History of chicken pox   . History of depression   . History of hay fever   . History of migraine    and frequent Headaches  . Hypertension   . Pelvic pain   . Pulmonary embolism [facility])     Patient Active Problem List   Diagnosis Date Noted  . Hyponatremia 02/17/2017  . Alcohol withdrawal (HCC) 02/17/2017  . Hypokalemia 09/24/2016  . Pancreatitis 09/24/2016  . Abdominal pain 09/22/2016  . H/O alcohol abuse 09/22/2016  . H/O acute pancreatitis 09/22/2016  . GERD (gastroesophageal reflux disease) 04/09/2015  . Abdominal pain, epigastric 01/17/2014  . 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 Chest pain 08/20/2012  . Stress reaction 07/23/2012  . Essential hypertension 06/09/2012  . History of pulmonary embolism 06/09/2012  . Preop cardiovascular exam 06/08/2012  . Dysmenorrhea   . Dyspareunia   . Pelvic pain   . Cardiomyopathy (HCC)   . Cyst of ovary     Past Surgical History:  Procedure Laterality Date  . ABDOMINAL EXPLORATION SURGERY    . CESAREAN SECTION  1994  . ECTOPIC PREGNANCY SURGERY    . LAPAROSCOPY N/A 09/24/2012   Procedure: OPERATIVE LAPROSCOPY WITH LYSIS OF ADHESIONS;  Surgeon: Sherron Monday, MD;  Location: [facility];  Service: Gynecology;  Laterality: N/A;  . TUBAL LIGATION  2010    OB History    No data available  Home Medications    Prior to Admission medications   Medication Sig Start Date End Date Taking? Authorizing Provider  amLODipine (NORVASC) 5 MG tablet Take 1 tablet (5 mg total) by mouth daily. 09/22/16  Yes Tower, Audrie Gallus, MD  hydrochlorothiazide (HYDRODIURIL) 25 MG tablet Take 1 tablet (25 mg total) by mouth daily. 09/22/16  Yes Tower, Audrie Gallus, MD  ibuprofen (ADVIL,MOTRIN) 200 MG tablet Take 800 mg by mouth every 6 (six) hours as needed (pain).   Yes [provider]  lisinopril (PRINIVIL,ZESTRIL) 10 MG tablet Take 1 tablet (10 mg total) by mouth daily. 09/22/16  Yes Tower, Audrie Gallus, MD  dicyclomine (BENTYL)  20 MG tablet Take 1 tablet (20 mg total) by mouth 2 (two) times daily as needed for spasms. As needed for abdominal cramping Patient not taking: Reported on 01/02/2017 09/22/16   Tower, Audrie Gallus, MD  HYDROcodone-acetaminophen (NORCO/VICODIN) 5-325 MG tablet Take 1-2 tablets by mouth every 4 (four) hours as needed for severe pain. Patient not taking: Reported on 02/16/2017 01/02/17   Shaune Pollack, MD  ibuprofen (ADVIL,MOTRIN) 600 MG tablet Take 1 tablet (600 mg total) by mouth every 6 (six) hours as needed for moderate pain. Patient not taking: Reported on 02/16/2017 01/02/17   Shaune Pollack, MD  omeprazole (PRILOSEC) 20 MG capsule Take 1 capsule (20 mg total) by mouth daily. Patient not taking: Reported on 02/16/2017 09/22/16   Tower, Audrie Gallus, MD  ondansetron (ZOFRAN ODT) 4 MG disintegrating tablet Take 1 tablet (4 mg total) by mouth every 8 (eight) hours as needed for nausea or vomiting. Patient not taking: Reported on 02/16/2017 01/02/17   Shaune Pollack, MD  potassium chloride SA (K-DUR,KLOR-CON) 20 MEQ tablet Take 1 tablet (20 mEq total) by mouth 3 (three) times daily. Patient not taking: Reported on 01/02/2017 09/23/16   Tower, Audrie Gallus, MD  promethazine (PHENERGAN) 25 MG tablet Take 1 tablet (25 mg total) by mouth every 8 (eight) hours as needed for nausea or vomiting. Caution of sedation Patient not taking: Reported on 01/02/2017 09/22/16   Judy Pimple, MD    Family History Family History  Problem Relation Age of Onset  . Cancer - Lung Mother   . Heart failure Father   . Hypertension Father     Social History Social History   Tobacco Use  . Smoking status: Never Smoker  . Smokeless tobacco: Never Used  Substance Use Topics  . Alcohol use: No    Alcohol/week: 0.0 oz    Comment: occ  . Drug use: No     Allergies   Patient has no known allergies.   Review of Systems Review of Systems  Constitutional: Positive for diaphoresis. Negative for appetite change, fatigue, fever  and unexpected weight change.  HENT: Negative for mouth sores.   Eyes: Negative for visual disturbance.  Respiratory: Negative for cough, chest tightness, shortness of breath and wheezing.   Cardiovascular: Positive for chest pain.  Gastrointestinal: Positive for nausea and vomiting. Negative for abdominal pain, constipation and diarrhea.  Endocrine: Negative for polydipsia, polyphagia and polyuria.  Genitourinary: Negative for dysuria, frequency, hematuria and urgency.  Musculoskeletal: Negative for back pain and neck stiffness.  Skin: Negative for rash.  Allergic/Immunologic: Negative for immunocompromised state.  Neurological: Positive for light-headedness. Negative for syncope and headaches.  Hematological: Does not bruise/bleed easily.  Psychiatric/Behavioral: Negative for sleep disturbance. The patient is not nervous/anxious.      Physical Exam Updated Vital Signs BP (!) 154/83 (BP Location: Left Arm)  Pulse (!) 123   Temp 98.1 F (36.7 C) (Oral)   Resp 20   Ht 5\' 10"  (1.778 m)   Wt 64 kg (141 lb)   SpO2 99%   BMI 20.23 kg/m   Physical Exam  Constitutional: She appears well-developed and well-nourished. She appears distressed.  Awake, alert, Patient uncomfortable appearing, diaphoretic and dry heaving  HENT:  Head: Normocephalic and atraumatic.  Mouth/Throat: Oropharynx is clear and moist. No oropharyngeal exudate.  Eyes: Conjunctivae and EOM are normal. Pupils are equal, round, and reactive to light. No scleral icterus.  Neck: Normal range of motion. Neck supple.  Cardiovascular: Regular rhythm and intact distal pulses. Tachycardia present.  No murmur heard. Pulses:      Radial pulses are 1+ on the right side, and 2+ on the left side.       Posterior tibial pulses are 1+ on the right side, and 2+ on the left side.  Pulmonary/Chest: Effort normal and breath sounds normal. No respiratory distress. She has no decreased breath sounds. She has no wheezes. She has no  rales.  Equal chest expansion  Abdominal: Soft. Bowel sounds are normal. She exhibits no mass. There is no tenderness. There is no rebound and no guarding.  Musculoskeletal: Normal range of motion. She exhibits no edema.       Right lower leg: She exhibits no tenderness and no edema.       Left lower leg: She exhibits no tenderness and no edema.  No peripheral edema No calf tenderness  Neurological: She is alert.  Speech is clear and goal oriented Moves extremities without ataxia  Skin: Skin is warm. She is diaphoretic. There is pallor.  Psychiatric: Her mood appears anxious.  Patient anxious appearing  Nursing note and vitals reviewed.    ED Treatments / Results  Labs (all labs ordered are listed, but only abnormal results are displayed) Labs Reviewed  CBC - Abnormal; Notable for the following components:      Result Value   WBC 12.4 (*)    All other components within normal limits  COMPREHENSIVE METABOLIC PANEL - Abnormal; Notable for the following components:   Sodium 129 (*)    Potassium 3.1 (*)    Chloride 90 (*)    Glucose, Bld 108 (*)    AST 69 (*)    ALT 63 (*)    Anion gap 17 (*)    All other components within normal limits  RAPID URINE DRUG SCREEN, HOSP PERFORMED - Abnormal; Notable for the following components:   Tetrahydrocannabinol POSITIVE (*)    All other components within normal limits  URINALYSIS, ROUTINE W REFLEX MICROSCOPIC - Abnormal; Notable for the following components:   Color, Urine STRAW (*)    Specific Gravity, Urine 1.004 (*)    All other components within normal limits  ETHANOL  D-DIMER, QUANTITATIVE (NOT [facility])  LIPASE, BLOOD  HIV ANTIBODY (ROUTINE TESTING)  COMPREHENSIVE METABOLIC PANEL  CBC WITH DIFFERENTIAL/PLATELET  I-STAT TROPONIN, ED  I-STAT BETA HCG BLOOD, ED ([facility], [facility], [facility] ONLY)  I-STAT TROPONIN, ED    EKG  EKG Interpretation  Date/Time:  Monday February 16 2017 19:00:23 EST Ventricular Rate:  132 PR Interval:  138 QRS  Duration: 68 QT Interval:  304 QTC Calculation: 450 R Axis:   38 Text Interpretation:  Sinus tachycardia Septal infarct , age undetermined Abnormal ECG Artifact When compared with ECG of 01/03/2017, No significant change was found Confirmed by Dione BoozeGlick, David ([id number]) on 02/16/2017 11:50:12 PM  Radiology Dg Chest 2 View  Result Date: 02/16/2017 CLINICAL DATA:  Chest pain. EXAM: CHEST  2 VIEW COMPARISON:  Radiographs of August 25, 2016. FINDINGS: The heart size and mediastinal contours are within normal limits. Both lungs are clear. No pneumothorax or pleural effusion is noted. The visualized skeletal structures are unremarkable. IMPRESSION: No active cardiopulmonary disease. Electronically Signed   By: Lupita Raider, M.D.   On: 02/16/2017 19:30    Procedures Procedures (including critical care time)  Medications Ordered in ED Medications  pantoprazole (PROTONIX) EC tablet 40 mg (not administered)  potassium chloride SA (K-DUR,KLOR-CON) CR tablet 40 mEq (not administered)  amLODipine (NORVASC) tablet 5 mg (not administered)  lisinopril (PRINIVIL,ZESTRIL) tablet 10 mg (not administered)  enoxaparin (LOVENOX) injection 40 mg (not administered)  sodium chloride flush (NS) 0.9 % injection 3 mL (not administered)  0.9 % NaCl with KCl 20 mEq/ L  infusion (not administered)  acetaminophen (TYLENOL) tablet 650 mg (not administered)    Or  acetaminophen (TYLENOL) suppository 650 mg (not administered)  HYDROcodone-acetaminophen (NORCO/VICODIN) 5-325 MG per tablet 1-2 tablet (not administered)  ondansetron (ZOFRAN) tablet 4 mg (not administered)    Or  ondansetron (ZOFRAN) injection 4 mg (not administered)  LORazepam (ATIVAN) injection 2-3 mg (not administered)  folic acid (FOLVITE) tablet 1 mg (not administered)  multivitamin with minerals tablet 1 tablet (not administered)  thiamine (VITAMIN B-1) tablet 100 mg (not administered)  sodium chloride 0.9 % bolus 1,000 mL (0 mLs Intravenous  Stopped 02/17/17 0130)  ondansetron (ZOFRAN) injection 4 mg (4 mg Intravenous Given 02/17/17 0142)  sodium chloride 0.9 % bolus 500 mL (500 mLs Intravenous New Bag/Given 02/17/17 0143)     Initial Impression / Assessment and Plan / ED Course  I have reviewed the triage vital signs and the nursing notes.  Pertinent labs & imaging results that were available during my care of the patient were reviewed by me and considered in my medical decision making (see chart for details).  Clinical Course as of Feb 17 213  [facility] Feb 16, 2017  2321 tachycardia Pulse Rate: (!) 123 [HM]  2321 leukocytosis WBC: (!) 12.4 [HM]  Tue Feb 17, 2017  0010 CIWA 14  [HM]  0128 Pt continues to have vomiting after 2mg  of Ativan IV.    [HM]  0135 CIWA 19.  Will give 2mg  additional Ativan  [HM]  0150 Discussed with Dr. Antionette Char who will admit.  [HM]  0214 I personally reviewed and interpreted the images. DG Chest 2 View [HM]    Clinical Course User Index [HM] Mahli Glahn, Dahlia Client, [place]    Patient presents with chest pain.  Initial and repeat troponin are without elevation.  EKG with sinus tachycardia but no evidence of ischemia.  D-dimer is negative.  Doubt patient's chest pain is secondary to acute coronary syndrome or coronary embolus.  Chest x-ray without evidence of pneumonia, pneumothorax or pulmonary edema.  Patient does report she has recently stopped drinking.  She has never had a seizure with decreased alcohol intake.  She is tachycardic, diaphoretic and vomiting.  Suspect acute alcohol withdrawal.  She is not hallucinating.  Fluids and Ativan.  Upon recheck, patient remains tachycardic and continues to vomit.  Her CIWA has increased despite ativan administration.  Patient given an additional 2 mg of Ativan and zofran but will need admission for this.  The patient was discussed with and seen by Dr. Manus Gunning who agrees with the treatment plan.   Final Clinical Impressions(s) / ED Diagnoses  Final diagnoses:    Alcohol abuse  Central chest pain  Hypokalemia  Alcohol withdrawal syndrome without complication [facility])    ED Discharge Orders    None       Ivelise Castillo, Boyd Kerbs 02/17/17 0217    Glynn Octave, MD 02/17/17 [phone]

## 2017-02-17 ENCOUNTER — Other Ambulatory Visit: Payer: Self-pay

## 2017-02-17 ENCOUNTER — Encounter (HOSPITAL_COMMUNITY): Payer: Self-pay | Admitting: Family Medicine

## 2017-02-17 DIAGNOSIS — E876 Hypokalemia: Secondary | ICD-10-CM | POA: Diagnosis not present

## 2017-02-17 DIAGNOSIS — F10239 Alcohol dependence with withdrawal, unspecified: Secondary | ICD-10-CM | POA: Diagnosis present

## 2017-02-17 DIAGNOSIS — Z86718 Personal history of other venous thrombosis and embolism: Secondary | ICD-10-CM | POA: Diagnosis not present

## 2017-02-17 DIAGNOSIS — N803 Endometriosis of pelvic peritoneum: Secondary | ICD-10-CM | POA: Diagnosis present

## 2017-02-17 DIAGNOSIS — Z23 Encounter for immunization: Secondary | ICD-10-CM | POA: Diagnosis not present

## 2017-02-17 DIAGNOSIS — F1010 Alcohol abuse, uncomplicated: Secondary | ICD-10-CM | POA: Diagnosis not present

## 2017-02-17 DIAGNOSIS — Z86711 Personal history of pulmonary embolism: Secondary | ICD-10-CM | POA: Diagnosis not present

## 2017-02-17 DIAGNOSIS — T424X5A Adverse effect of benzodiazepines, initial encounter: Secondary | ICD-10-CM | POA: Diagnosis not present

## 2017-02-17 DIAGNOSIS — F10230 Alcohol dependence with withdrawal, uncomplicated: Secondary | ICD-10-CM | POA: Diagnosis present

## 2017-02-17 DIAGNOSIS — F10939 Alcohol use, unspecified with withdrawal, unspecified: Secondary | ICD-10-CM | POA: Diagnosis present

## 2017-02-17 DIAGNOSIS — I10 Essential (primary) hypertension: Secondary | ICD-10-CM | POA: Diagnosis not present

## 2017-02-17 DIAGNOSIS — I959 Hypotension, unspecified: Secondary | ICD-10-CM | POA: Diagnosis not present

## 2017-02-17 DIAGNOSIS — K219 Gastro-esophageal reflux disease without esophagitis: Secondary | ICD-10-CM | POA: Diagnosis present

## 2017-02-17 DIAGNOSIS — I429 Cardiomyopathy, unspecified: Secondary | ICD-10-CM | POA: Diagnosis not present

## 2017-02-17 DIAGNOSIS — R079 Chest pain, unspecified: Secondary | ICD-10-CM | POA: Diagnosis not present

## 2017-02-17 DIAGNOSIS — E871 Hypo-osmolality and hyponatremia: Secondary | ICD-10-CM | POA: Diagnosis present

## 2017-02-17 DIAGNOSIS — Z8249 Family history of ischemic heart disease and other diseases of the circulatory system: Secondary | ICD-10-CM | POA: Diagnosis not present

## 2017-02-17 DIAGNOSIS — F1210 Cannabis abuse, uncomplicated: Secondary | ICD-10-CM | POA: Diagnosis present

## 2017-02-17 LAB — MRSA PCR SCREENING: MRSA by PCR: POSITIVE — AB

## 2017-02-17 LAB — CBC WITH DIFFERENTIAL/PLATELET
Basophils Absolute: 0 10*3/uL (ref 0.0–0.1)
Basophils Relative: 0 %
Eosinophils Absolute: 0.2 10*3/uL (ref 0.0–0.7)
Eosinophils Relative: 3 %
HCT: 32.4 % — ABNORMAL LOW (ref 36.0–46.0)
Hemoglobin: 10.8 g/dL — ABNORMAL LOW (ref 12.0–15.0)
Lymphocytes Relative: 35 %
Lymphs Abs: 2.2 10*3/uL (ref 0.7–4.0)
MCH: 29.7 pg (ref 26.0–34.0)
MCHC: 33.3 g/dL (ref 30.0–36.0)
MCV: 89 fL (ref 78.0–100.0)
Monocytes Absolute: 0.7 10*3/uL (ref 0.1–1.0)
Monocytes Relative: 12 %
Neutro Abs: 3.1 10*3/uL (ref 1.7–7.7)
Neutrophils Relative %: 50 %
Platelets: 173 10*3/uL (ref 150–400)
RBC: 3.64 MIL/uL — ABNORMAL LOW (ref 3.87–5.11)
RDW: 13.6 % (ref 11.5–15.5)
WBC: 6.3 10*3/uL (ref 4.0–10.5)

## 2017-02-17 LAB — HIV ANTIBODY (ROUTINE TESTING W REFLEX): HIV Screen 4th Generation wRfx: NONREACTIVE

## 2017-02-17 LAB — COMPREHENSIVE METABOLIC PANEL
ALT: 47 U/L (ref 14–54)
AST: 52 U/L — ABNORMAL HIGH (ref 15–41)
Albumin: 3.5 g/dL (ref 3.5–5.0)
Alkaline Phosphatase: 32 U/L — ABNORMAL LOW (ref 38–126)
Anion gap: 12 (ref 5–15)
BUN: 11 mg/dL (ref 6–20)
CO2: 22 mmol/L (ref 22–32)
Calcium: 8 mg/dL — ABNORMAL LOW (ref 8.9–10.3)
Chloride: 100 mmol/L — ABNORMAL LOW (ref 101–111)
Creatinine, Ser: 0.76 mg/dL (ref 0.44–1.00)
GFR calc Af Amer: >60 mL/min (ref >60)
GFR calc non Af Amer: >60 mL/min (ref >60)
Glucose, Bld: 90 mg/dL (ref 65–99)
Potassium: 3 mmol/L — ABNORMAL LOW (ref 3.5–5.1)
Sodium: 134 mmol/L — ABNORMAL LOW (ref 135–145)
Total Bilirubin: 0.6 mg/dL (ref 0.3–1.2)
Total Protein: 5.6 g/dL — ABNORMAL LOW (ref 6.5–8.1)

## 2017-02-17 LAB — TROPONIN I: Troponin I: <0.03 ng/mL (ref <0.03)

## 2017-02-17 LAB — I-STAT TROPONIN, ED: Troponin i, poc: 0 ng/mL (ref 0.00–0.08)

## 2017-02-17 LAB — D-DIMER, QUANTITATIVE: D-Dimer, Quant: <0.27 ug/mL-FEU (ref 0.00–0.50)

## 2017-02-17 LAB — LIPASE, BLOOD: Lipase: 39 U/L (ref 11–51)

## 2017-02-17 MED ORDER — ACETAMINOPHEN 325 MG PO TABS
650.0000 mg | ORAL_TABLET | Freq: Four times a day (QID) | ORAL | Status: DC | PRN
  Administered 2017-02-17 (×2): 650 mg via ORAL
  Filled 2017-02-17 (×2): qty 2

## 2017-02-17 MED ORDER — ASPIRIN 81 MG PO CHEW
324.0000 mg | CHEWABLE_TABLET | Freq: Once | ORAL | Status: AC
  Administered 2017-02-17: 324 mg via ORAL
  Filled 2017-02-17: qty 4

## 2017-02-17 MED ORDER — ONDANSETRON HCL 4 MG PO TABS: 4.0000 mg | ORAL_TABLET | Freq: Four times a day (QID) | ORAL | Status: DC | PRN

## 2017-02-17 MED ORDER — MAGNESIUM SULFATE 2 GM/50ML IV SOLN
2.0000 g | Freq: Once | INTRAVENOUS | Status: AC
  Administered 2017-02-17: 2 g via INTRAVENOUS
  Filled 2017-02-17: qty 50

## 2017-02-17 MED ORDER — POTASSIUM CHLORIDE CRYS ER 20 MEQ PO TBCR
40.0000 meq | EXTENDED_RELEASE_TABLET | Freq: Every day | ORAL | Status: DC
  Administered 2017-02-17 – 2017-02-18 (×2): 40 meq via ORAL
  Filled 2017-02-17 (×2): qty 2

## 2017-02-17 MED ORDER — POTASSIUM CHLORIDE IN NACL 20-0.9 MEQ/L-% IV SOLN
INTRAVENOUS | Status: AC
  Administered 2017-02-17: 03:00:00 via INTRAVENOUS
  Filled 2017-02-17 (×2): qty 1000

## 2017-02-17 MED ORDER — POTASSIUM CHLORIDE CRYS ER 20 MEQ PO TBCR
40.0000 meq | EXTENDED_RELEASE_TABLET | Freq: Once | ORAL | Status: DC
  Filled 2017-02-17: qty 2

## 2017-02-17 MED ORDER — FOLIC ACID 1 MG PO TABS
1.0000 mg | ORAL_TABLET | Freq: Every day | ORAL | Status: DC
  Administered 2017-02-17 – 2017-02-18 (×2): 1 mg via ORAL
  Filled 2017-02-17 (×2): qty 1

## 2017-02-17 MED ORDER — SODIUM CHLORIDE 0.9% FLUSH
3.0000 mL | Freq: Two times a day (BID) | INTRAVENOUS | Status: DC
  Administered 2017-02-17 – 2017-02-18 (×3): 3 mL via INTRAVENOUS

## 2017-02-17 MED ORDER — INFLUENZA VAC SPLIT QUAD 0.5 ML IM SUSY
0.5000 mL | PREFILLED_SYRINGE | INTRAMUSCULAR | Status: AC
  Administered 2017-02-18: 0.5 mL via INTRAMUSCULAR
  Filled 2017-02-17: qty 0.5

## 2017-02-17 MED ORDER — VITAMIN B-1 100 MG PO TABS
100.0000 mg | ORAL_TABLET | Freq: Every day | ORAL | Status: DC
  Administered 2017-02-18: 100 mg via ORAL
  Filled 2017-02-17: qty 1

## 2017-02-17 MED ORDER — ENOXAPARIN SODIUM 40 MG/0.4ML ~~LOC~~ SOLN
40.0000 mg | SUBCUTANEOUS | Status: DC
  Administered 2017-02-17 – 2017-02-18 (×2): 40 mg via SUBCUTANEOUS
  Filled 2017-02-17 (×3): qty 0.4

## 2017-02-17 MED ORDER — ONDANSETRON HCL 4 MG/2ML IJ SOLN
4.0000 mg | INTRAMUSCULAR | Status: DC | PRN
  Administered 2017-02-18: 4 mg via INTRAVENOUS
  Filled 2017-02-17: qty 2

## 2017-02-17 MED ORDER — ONDANSETRON HCL 4 MG/2ML IJ SOLN
4.0000 mg | Freq: Four times a day (QID) | INTRAMUSCULAR | Status: DC | PRN
  Administered 2017-02-17: 4 mg via INTRAVENOUS
  Filled 2017-02-17: qty 2

## 2017-02-17 MED ORDER — GI COCKTAIL ~~LOC~~: 30.0000 mL | Freq: Three times a day (TID) | ORAL | Status: DC | PRN

## 2017-02-17 MED ORDER — ONDANSETRON HCL 4 MG/2ML IJ SOLN
4.0000 mg | Freq: Once | INTRAMUSCULAR | Status: AC
  Administered 2017-02-17: 4 mg via INTRAVENOUS
  Filled 2017-02-17: qty 2

## 2017-02-17 MED ORDER — SODIUM CHLORIDE 0.9 % IV BOLUS (SEPSIS)
500.0000 mL | Freq: Once | INTRAVENOUS | Status: AC
  Administered 2017-02-17: 500 mL via INTRAVENOUS

## 2017-02-17 MED ORDER — LISINOPRIL 10 MG PO TABS: 10.0000 mg | ORAL_TABLET | Freq: Every day | ORAL | Status: DC

## 2017-02-17 MED ORDER — LORAZEPAM 2 MG/ML IJ SOLN
2.0000 mg | INTRAMUSCULAR | Status: DC | PRN
  Administered 2017-02-17 – 2017-02-18 (×4): 2 mg via INTRAVENOUS
  Administered 2017-02-18: 3 mg via INTRAVENOUS
  Filled 2017-02-17 (×2): qty 1
  Filled 2017-02-17: qty 2
  Filled 2017-02-17 (×2): qty 1

## 2017-02-17 MED ORDER — ADULT MULTIVITAMIN W/MINERALS CH
1.0000 | ORAL_TABLET | Freq: Every day | ORAL | Status: DC
  Administered 2017-02-17 – 2017-02-18 (×2): 1 via ORAL
  Filled 2017-02-17 (×2): qty 1

## 2017-02-17 MED ORDER — PROMETHAZINE HCL 25 MG/ML IJ SOLN
25.0000 mg | Freq: Four times a day (QID) | INTRAMUSCULAR | Status: DC | PRN
  Administered 2017-02-18: 25 mg via INTRAVENOUS
  Filled 2017-02-17: qty 1

## 2017-02-17 MED ORDER — HYDROCODONE-ACETAMINOPHEN 5-325 MG PO TABS: 1.0000 | ORAL_TABLET | ORAL | Status: DC | PRN

## 2017-02-17 MED ORDER — POTASSIUM CHLORIDE 10 MEQ/100ML IV SOLN: 10.0000 meq | INTRAVENOUS | Status: DC

## 2017-02-17 MED ORDER — AMLODIPINE BESYLATE 5 MG PO TABS: 5.0000 mg | ORAL_TABLET | Freq: Every day | ORAL | Status: DC

## 2017-02-17 MED ORDER — ACETAMINOPHEN 650 MG RE SUPP: 650.0000 mg | Freq: Four times a day (QID) | RECTAL | Status: DC | PRN

## 2017-02-17 MED ORDER — PNEUMOCOCCAL VAC POLYVALENT 25 MCG/0.5ML IJ INJ
0.5000 mL | INJECTION | INTRAMUSCULAR | Status: AC
  Administered 2017-02-18: 0.5 mL via INTRAMUSCULAR
  Filled 2017-02-17: qty 0.5

## 2017-02-17 MED ORDER — ONDANSETRON HCL 4 MG PO TABS
4.0000 mg | ORAL_TABLET | ORAL | Status: DC | PRN
  Administered 2017-02-17 (×2): 4 mg via ORAL
  Filled 2017-02-17 (×2): qty 1

## 2017-02-17 MED ORDER — PANTOPRAZOLE SODIUM 40 MG PO TBEC
40.0000 mg | DELAYED_RELEASE_TABLET | Freq: Every day | ORAL | Status: DC
  Administered 2017-02-17 – 2017-02-18 (×2): 40 mg via ORAL
  Filled 2017-02-17 (×2): qty 1

## 2017-02-17 NOTE — ED Notes (Signed)
Patient walk to bathroom w/ standby assist, But patient had to use wheelchair to go back to room because got weak.

## 2017-02-17 NOTE — ED Notes (Signed)
Meal tray ordered 

## 2017-02-17 NOTE — ED Notes (Signed)
Regular Diet was ordered for Lunch. 

## 2017-02-17 NOTE — H&P (Signed)
History and Physical    Nicole Hughes:[id number] DOB: August 11, 1970 DOA: 02/16/2017  PCP: Judy Pimple, MD   Patient coming from: Home  Chief Complaint: Chest pain, nausea, vomiting, anxiety, tremor, sweats   HPI: Nicole Hughes is a 46 y.o. female with medical history significant for hypertension and alcohol abuse, now presenting to the emergency department for evaluation of chest pain, nausea, couple episodes of nonbloody vomiting, paresis, and anxiety.  She reports that she has been attempting to wean herself alcohol for the past couple weeks.  She was previously drinking hard liquor throughout the day on a daily basis, switched to beer several days ago, and only had to drinks today.  This afternoon, she developed nausea, has had 2 episodes of nonbloody vomiting, sweats, tremors, and anxiety.  She also reports a pain in the central chest without alleviating or exacerbating factors identified.  She has never experienced the chest discomfort previously.  She denies any history of alcohol withdrawal seizures or hallucinations.  No recent fevers or chills.  ED Course: Upon arrival to the ED, patient is found to be afebrile, saturating well on room air, tachypneic, tachycardic, and hypertensive.  EKG features a sinus tachycardia with rate 132 and chest x-ray is negative for acute cardiopulmonary disease.  Chemistry panel is notable for a sodium of 129 and potassium of 3.1, as well as mild elevations in serum transaminases.  CBC is notable for leukocytosis to 12,400, ethanol level is undetectable, d-dimer is negative, UDS is positive for THC, and troponin is negative x2 and decreasing.  Patient was treated with 1.5 L of normal saline and 2 mg IV Ativan x2.  She remains tachycardic and tremulous and will be admitted to the stepdown unit for ongoing evaluation and management of his alcohol dependence with acute withdrawal.  Review of Systems:  All other systems reviewed and apart from HPI, are  negative.  Past Medical History:  Diagnosis Date  . Cardiomyopathy (HCC)   . Complication of anesthesia    "hard to get under"  . Cyst of ovary   . Dysmenorrhea   . Dyspareunia   . 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 History of blood clots   . History of chicken pox   . History of depression   . History of hay fever   . History of migraine    and frequent Headaches  . Hypertension   . Pelvic pain   . Pulmonary embolism [facility])     Past Surgical History:  Procedure Laterality Date  . ABDOMINAL EXPLORATION SURGERY    . CESAREAN SECTION  1994  . ECTOPIC PREGNANCY SURGERY    . LAPAROSCOPY N/A 09/24/2012   Procedure: OPERATIVE LAPROSCOPY WITH LYSIS OF ADHESIONS;  Surgeon: Sherron Monday, MD;  Location: [facility];  Service: Gynecology;  Laterality: N/A;  . TUBAL LIGATION  2010     reports that  has never smoked. she has never used smokeless tobacco. She reports that she does not drink alcohol or use drugs.  No Known Allergies  Family History  Problem Relation Age of Onset  . Cancer - Lung Mother   . Heart failure Father   . Hypertension Father      Prior to Admission medications   Medication Sig Start Date End Date Taking? Authorizing Provider  amLODipine (NORVASC) 5 MG tablet Take 1 tablet (5 mg total) by mouth daily. 09/22/16  Yes Tower, Audrie Gallus, MD  hydrochlorothiazide (HYDRODIURIL) 25 MG tablet Take 1 tablet (25 mg total)  by mouth daily. 09/22/16  Yes Tower, Audrie GallusMarne A, MD  ibuprofen (ADVIL,MOTRIN) 200 MG tablet Take 800 mg by mouth every 6 (six) hours as needed (pain).   Yes [provider]  lisinopril (PRINIVIL,ZESTRIL) 10 MG tablet Take 1 tablet (10 mg total) by mouth daily. 09/22/16  Yes Tower, Audrie GallusMarne A, MD  dicyclomine (BENTYL) 20 MG tablet Take 1 tablet (20 mg total) by mouth 2 (two) times daily as needed for spasms. As needed for abdominal cramping Patient not taking: Reported on 01/02/2017 09/22/16   Tower, Audrie GallusMarne A, MD  HYDROcodone-acetaminophen  (NORCO/VICODIN) 5-325 MG tablet Take 1-2 tablets by mouth every 4 (four) hours as needed for severe pain. Patient not taking: Reported on 02/16/2017 01/02/17   Shaune PollackIsaacs, Cameron, MD  ibuprofen (ADVIL,MOTRIN) 600 MG tablet Take 1 tablet (600 mg total) by mouth every 6 (six) hours as needed for moderate pain. Patient not taking: Reported on 02/16/2017 01/02/17   Shaune PollackIsaacs, Cameron, MD  omeprazole (PRILOSEC) 20 MG capsule Take 1 capsule (20 mg total) by mouth daily. Patient not taking: Reported on 02/16/2017 09/22/16   Tower, Audrie GallusMarne A, MD  ondansetron (ZOFRAN ODT) 4 MG disintegrating tablet Take 1 tablet (4 mg total) by mouth every 8 (eight) hours as needed for nausea or vomiting. Patient not taking: Reported on 02/16/2017 01/02/17   Shaune PollackIsaacs, Cameron, MD  potassium chloride SA (K-DUR,KLOR-CON) 20 MEQ tablet Take 1 tablet (20 mEq total) by mouth 3 (three) times daily. Patient not taking: Reported on 01/02/2017 09/23/16   Tower, Audrie GallusMarne A, MD  promethazine (PHENERGAN) 25 MG tablet Take 1 tablet (25 mg total) by mouth every 8 (eight) hours as needed for nausea or vomiting. Caution of sedation Patient not taking: Reported on 01/02/2017 09/22/16   Judy Pimpleower, Marne A, MD    Physical Exam: Vitals:   02/17/17 0045 02/17/17 0100 02/17/17 0115 02/17/17 0128  BP: 122/70 122/77 136/81 136/81  Pulse: (!) 105 94 (!) 113 (!) 113  Resp: 17 15 (!) 23   Temp:      TempSrc:      SpO2: 100% 100% 100%   Weight:      Height:          Constitutional: No respiratory distress, tremulous, anxious Eyes: PERTLA, lids and conjunctivae normal ENMT: Mucous membranes are moist. Posterior pharynx clear of any exudate or lesions.   Neck: normal, supple, no masses, no thyromegaly Respiratory: clear to auscultation bilaterally, no wheezing, no crackles. Normal respiratory effort.   Cardiovascular: Rate ~120 and regular. No extremity edema. No significant JVD. Abdomen: No distension, no tenderness, no masses palpated. Bowel sounds active.    Musculoskeletal: no clubbing / cyanosis. No joint deformity upper and lower extremities.   Skin: no significant rashes, lesions, ulcers. Warm, dry, well-perfused. Neurologic: CN 2-12 grossly intact. Sensation intact. Strength 5/5 in all 4 limbs. Coarse tremor.  Psychiatric: Alert and oriented x 3. Anxious, cooperative.     Labs on Admission: I have personally reviewed following labs and imaging studies  CBC: Recent Labs  Lab 02/16/17 1903  WBC 12.4*  HGB 13.0  HCT 38.4  MCV 87.9  PLT 265   Basic Metabolic Panel: Recent Labs  Lab 02/16/17 1903  NA 129*  K 3.1*  CL 90*  CO2 22  GLUCOSE 108*  BUN 10  CREATININE 0.93  CALCIUM 9.2   GFR: Estimated Creatinine Clearance: 78 mL/min (by C-G formula based on SCr of 0.93 mg/dL). Liver Function Tests: Recent Labs  Lab 02/16/17 1903  AST 69*  ALT 63*  ALKPHOS 44  BILITOT 0.7  PROT 7.4  ALBUMIN 4.6   Recent Labs  Lab 02/16/17 2322  LIPASE 39   No results for input(s): AMMONIA in the last 168 hours. Coagulation Profile: No results for input(s): INR, PROTIME in the last 168 hours. Cardiac Enzymes: No results for input(s): CKTOTAL, CKMB, CKMBINDEX, TROPONINI in the last 168 hours. BNP (last 3 results) No results for input(s): PROBNP in the last 8760 hours. HbA1C: No results for input(s): HGBA1C in the last 72 hours. CBG: No results for input(s): GLUCAP in the last 168 hours. Lipid Profile: No results for input(s): CHOL, HDL, LDLCALC, TRIG, CHOLHDL, LDLDIRECT in the last 72 hours. Thyroid Function Tests: No results for input(s): TSH, T4TOTAL, FREET4, T3FREE, THYROIDAB in the last 72 hours. Anemia Panel: No results for input(s): VITAMINB12, FOLATE, FERRITIN, TIBC, IRON, RETICCTPCT in the last 72 hours. Urine analysis:    Component Value Date/Time   COLORURINE STRAW (A) 02/16/2017 2245   APPEARANCEUR CLEAR 02/16/2017 2245   LABSPEC 1.004 (L) 02/16/2017 2245   PHURINE 7.0 02/16/2017 2245   GLUCOSEU NEGATIVE  02/16/2017 2245   HGBUR NEGATIVE 02/16/2017 2245   BILIRUBINUR NEGATIVE 02/16/2017 2245   BILIRUBINUR trace 12/20/2012 1553   KETONESUR NEGATIVE 02/16/2017 2245   PROTEINUR NEGATIVE 02/16/2017 2245   UROBILINOGEN 0.2 06/22/2014 1147   NITRITE NEGATIVE 02/16/2017 2245   LEUKOCYTESUR NEGATIVE 02/16/2017 2245   Sepsis Labs: @LABRCNTIP (procalcitonin:4,lacticidven:4) )No results found for this or any previous visit (from the past 240 hour(s)).   Radiological Exams on Admission: Dg Chest 2 View  Result Date: 02/16/2017 CLINICAL DATA:  Chest pain. EXAM: CHEST  2 VIEW COMPARISON:  Radiographs of August 25, 2016. FINDINGS: The heart size and mediastinal contours are within normal limits. Both lungs are clear. No pneumothorax or pleural effusion is noted. The visualized skeletal structures are unremarkable. IMPRESSION: No active cardiopulmonary disease. Electronically Signed   By: Lupita Raider, M.D.   On: 02/16/2017 19:30    EKG: Independently reviewed. Sinus tachycardia (rate 132).   Assessment/Plan  1. Alcohol withdrawal  - Reports long history of heavy daily alcohol use and presents with anxiety, tremor, diaphoresis, N/V in setting of trying to quit drinking  - She was treated with Ativan 2 mg IV x2 in ED, but CIWA has increased   - Continue CIWA with prn Ativan, continue IVF hydration, monitor and replete lytes, supplement vitamins and minerals, consult with SW    2. Chest pain  - Reports constant central chest pain that is reproducible, possibly secondary to vomiting and retching  - CXR unremarkable, EKG with sinus tachycardia, troponin neg x2 and decreasing  - Treated with ASA 324 mg  - Low-suspicion for ACS  - Continue cardiac monitoring, check one more troponin in several hours    3. Hyponatremia  - Serum sodium is 129 on admission  - Likely secondary to hypovolemia, alcoholism, and/or HCTZ  - Treated in ED with 1.5 liters NS   - Hold HCTZ, continue NS infusion, repeat chem  panel in am   4. Hypokalemia  - Serum potassium is 3.1 on admission  - Treated with 40 mEq oral potassium and KCl added to IVF  - Continue cardiac monitoring and repeat chem panel in am   5. Hypertension  - BP elevated in ED in setting of alcohol withdrawal  - Treat withdrawal as above, continue lisinopril and Norvasc, hold HCTZ in light of electrolyte derangements    DVT prophylaxis: Lovenox Code Status: Full  Family Communication: Father updated at bedside with patient's permission  Disposition Plan: Admit to SDU Consults called: None Admission status: Inpatient    Briscoe Deutscher, MD [facility] Pager [phone]  If 7PM-7AM, please contact night-coverage [email or web address] Password [email or web address]  02/17/2017, 2:03 AM

## 2017-02-17 NOTE — ED Notes (Signed)
Patient resting; father came but left. He has LVAD in place.

## 2017-02-17 NOTE — ED Notes (Signed)
Pt's blood pressure for the last hour as been consisently low, however pt is asystematic and alert and OX4. MD paged just to make aware. Pt c/o of nausea.

## 2017-02-17 NOTE — ED Notes (Signed)
Report given to Kate.

## 2017-02-17 NOTE — Progress Notes (Signed)
[facility]  [name] is a 47 y.o. female with medical history significant for hypertension and alcohol abuse presenting to the emergency department for evaluation of chest pain, nausea, nonbloody vomiting, paresis, and anxiety. She is admitted for alcohol withdrawal.   Principal Problem:   Alcohol withdrawal   - cont CIWA Q 1 hrs - request SW consult for alcohol cessation support services  Active Problems: Vomiting/ chest pain - suspect chest pain is mainly due to vomiting - will order GI cocktail and anitemetics- already on a PPI - Troponin negative-     Essential hypertension - became hypotensive this AM (possibly due to the amount of Ativan she received) - hold antihypertensives for now as BP is still low    Hypokalemia   Hyponatremia - replacing- repeat Bmet tomorrow - hold HCTZ  Marijuana abuse - UDS + for THC  Calvert CantorSaima Tuyen Uncapher, MD [facility] Pager: [email or web address]

## 2017-02-17 NOTE — ED Notes (Signed)
Pt resting.

## 2017-02-17 NOTE — ED Notes (Addendum)
Pt became tearful stating she lost her mother and has a lot of unresolved grief issues. RN [facility] to [facility] her. She agreed and stated she "has to get rid of these demons". SHe is drinking ice water and tolerating it.

## 2017-02-17 NOTE — ED Notes (Signed)
Pt was complaining of nausea and dry heaving,PT given PRN Zofran. This RN transported to to restroom via wheelchair. Pt was provided mesh under pants and paper scrub pants.

## 2017-02-17 NOTE — ED Notes (Signed)
Pt asleep. Fluids running; PO meds on hold so she can rest

## 2017-02-18 DIAGNOSIS — F1010 Alcohol abuse, uncomplicated: Secondary | ICD-10-CM

## 2017-02-18 DIAGNOSIS — E871 Hypo-osmolality and hyponatremia: Secondary | ICD-10-CM

## 2017-02-18 DIAGNOSIS — R079 Chest pain, unspecified: Secondary | ICD-10-CM

## 2017-02-18 DIAGNOSIS — E876 Hypokalemia: Secondary | ICD-10-CM

## 2017-02-18 DIAGNOSIS — F10239 Alcohol dependence with withdrawal, unspecified: Secondary | ICD-10-CM

## 2017-02-18 DIAGNOSIS — I10 Essential (primary) hypertension: Secondary | ICD-10-CM

## 2017-02-18 LAB — CBC
HCT: 31.9 % — ABNORMAL LOW (ref 36.0–46.0)
Hemoglobin: 10.6 g/dL — ABNORMAL LOW (ref 12.0–15.0)
MCH: 29.9 pg (ref 26.0–34.0)
MCHC: 33.2 g/dL (ref 30.0–36.0)
MCV: 90.1 fL (ref 78.0–100.0)
Platelets: 146 10*3/uL — ABNORMAL LOW (ref 150–400)
RBC: 3.54 MIL/uL — ABNORMAL LOW (ref 3.87–5.11)
RDW: 13.6 % (ref 11.5–15.5)
WBC: 3.9 10*3/uL — ABNORMAL LOW (ref 4.0–10.5)

## 2017-02-18 LAB — BASIC METABOLIC PANEL
Anion gap: 8 (ref 5–15)
BUN: 6 mg/dL (ref 6–20)
CO2: 23 mmol/L (ref 22–32)
Calcium: 8.5 mg/dL — ABNORMAL LOW (ref 8.9–10.3)
Chloride: 106 mmol/L (ref 101–111)
Creatinine, Ser: 0.67 mg/dL (ref 0.44–1.00)
GFR calc Af Amer: >60 mL/min (ref >60)
GFR calc non Af Amer: >60 mL/min (ref >60)
Glucose, Bld: 92 mg/dL (ref 65–99)
Potassium: 3.7 mmol/L (ref 3.5–5.1)
Sodium: 137 mmol/L (ref 135–145)

## 2017-02-18 LAB — HEPATITIS PANEL, ACUTE
HCV Ab: <0.1 s/co ratio (ref 0.0–0.9)
Hep A IgM: NEGATIVE
Hep B C IgM: NEGATIVE
Hepatitis B Surface Ag: NEGATIVE

## 2017-02-18 MED ORDER — CHLORHEXIDINE GLUCONATE CLOTH 2 % EX PADS: 6.0000 | MEDICATED_PAD | Freq: Every day | CUTANEOUS | Status: DC

## 2017-02-18 MED ORDER — ADULT MULTIVITAMIN W/MINERALS CH: 1.0000 | ORAL_TABLET | Freq: Every day | ORAL | 0 refills | Status: DC

## 2017-02-18 MED ORDER — MUPIROCIN 2 % EX OINT
1.0000 "application " | TOPICAL_OINTMENT | Freq: Two times a day (BID) | CUTANEOUS | Status: DC
  Administered 2017-02-18: 1 via NASAL
  Filled 2017-02-18: qty 22

## 2017-02-18 MED ORDER — THIAMINE HCL 100 MG PO TABS: 100.0000 mg | ORAL_TABLET | Freq: Every day | ORAL | 0 refills | Status: DC

## 2017-02-18 MED ORDER — FOLIC ACID 1 MG PO TABS
1.0000 mg | ORAL_TABLET | Freq: Every day | ORAL | 0 refills | Status: DC
Start: 2017-02-19 — End: 2017-05-25

## 2017-02-18 MED ORDER — MUPIROCIN 2 % EX OINT: 1.0000 "application " | TOPICAL_OINTMENT | Freq: Two times a day (BID) | CUTANEOUS | 0 refills | Status: DC

## 2017-02-18 NOTE — Progress Notes (Addendum)
CSW received consult regarding ETOH use. CSW spoke with patient. She reported feeling sleepy. She reported that she did feel she needed help with her alcohol use. CSW explained that patient will need to contact residential facilities and make an assessment appointment after she discharges from the hospital. Patient reported understanding and accepted resources. [facility] but [facility] do not accept private insurance.   CSW signing off.  Osborne Cascoadia Gurney Balthazor LCSW [phone]

## 2017-02-18 NOTE — Discharge Summary (Signed)
Physician Discharge Summary  Patient ID: Nicole RossettiMargaret M Hughes MRN: [id number] DOB/AGE: 01/14/1970 47 y.o.  Admit date: 02/16/2017 Discharge date: 02/18/2017  Admission Diagnoses:  Discharge Diagnoses:  Principal Problem:   Alcohol withdrawal (HCC) Active Problems:   Essential hypertension   Chest pain   Hypokalemia   Hyponatremia   Discharged Condition: stable  Brief history: Patient is a 47 year old female with past medical history significant for hypertension and alcohol abuse.  Patient presented with chest pain, nausea, nonbloody vomiting, paresis, and anxiety.  Patient was mainly admitted for management of possible alcohol withdrawal.   Hospital Course:  Alcohol withdrawal -patient was started on alcohol withdrawal protocol.  CIWA was done every 1 hour.  PRN benzodiazepine was ordered for possible alcohol withdrawals.  Patient has remained stable.  Significant alcohol withdrawal was noted.  Social worker was consulted to provide patient with local resources for alcohol withdrawal.  Patient will contact the alcohol on [employer].  Patient has remained stable and will be discharged back home.    Vomiting/ chest pain - suspect chest pain is mainly due to vomiting.  This resolved with GI cocktail and antiemetics.  Troponins came back negative.  No further chest pain reported during the hospital stay.  Essential hypertension -episode of hypotension was noted during the hospital stay, likely secondary to medication.  These resolved with optimization of patient's medication.    Electrolyte abnormalities -hypokalemia and hyponatremia when noted during the hospital stay.  He, the patient was on HCTZ prior to admission.  The HCTZ was held.  Potassium was repleted.  Hyponatremia resolved with supportive care and withholding of HCTZ.  Renal function and electrolytes were monitored closely during the hospital stay.  Marijuana abuse -patient's UDS was positive for tetrahydrocannabinol.  Discharge  Exam: Blood pressure (!) 146/88, pulse 96, temperature 98.9 F (37.2 C), temperature source Oral, resp. rate 19, height 5\' 5"  (1.651 m), weight 158 lb 4.6 oz (71.8 kg), SpO2 100 %.   Disposition: 01-Home or Self Care  Discharge Instructions    Discharge instructions   Complete by:  As directed    Call MD if the symptoms worsen.   Increase activity slowly   Complete by:  As directed      Allergies as of 02/18/2017   No Known Allergies     Medication List    STOP taking these medications   dicyclomine 20 MG tablet Commonly known as:  BENTYL   hydrochlorothiazide 25 MG tablet Commonly known as:  HYDRODIURIL   HYDROcodone-acetaminophen 5-325 MG tablet Commonly known as:  NORCO/VICODIN   ibuprofen 200 MG tablet Commonly known as:  ADVIL,MOTRIN   ibuprofen 600 MG tablet Commonly known as:  ADVIL,MOTRIN   ondansetron 4 MG disintegrating tablet Commonly known as:  ZOFRAN ODT   potassium chloride SA 20 MEQ tablet Commonly known as:  K-DUR,KLOR-CON   promethazine 25 MG tablet Commonly known as:  PHENERGAN     TAKE these medications   amLODipine 5 MG tablet Commonly known as:  NORVASC Take 1 tablet (5 mg total) by mouth daily.   folic acid 1 MG tablet Commonly known as:  FOLVITE Take 1 tablet (1 mg total) by mouth daily. Start taking on:  02/19/2017   lisinopril 10 MG tablet Commonly known as:  PRINIVIL,ZESTRIL Take 1 tablet (10 mg total) by mouth daily.   multivitamin with minerals Tabs tablet Take 1 tablet by mouth daily. Start taking on:  02/19/2017   mupirocin ointment 2 % Commonly known as:  BACTROBAN Place 1  application into the nose 2 (two) times daily.   omeprazole 20 MG capsule Commonly known as:  PRILOSEC Take 1 capsule (20 mg total) by mouth daily.   thiamine 100 MG tablet Take 1 tablet (100 mg total) by mouth daily. Start taking on:  02/19/2017        Signed: Barnetta Chapel 02/18/2017, 5:39 PM

## 2017-02-18 NOTE — Progress Notes (Signed)
Patient discharged home with husband, paperwork and prescriptions packed with belongings. IV removed and patient given information about alcohol abuse resources. Patient wheeled to vehicle.

## 2017-02-18 NOTE — Discharge Instructions (Signed)

## 2017-02-18 NOTE — Progress Notes (Signed)
CSW received phone call about pt requiring resources. Pt [place] additional resources and [place].   Montine CircleKelsy Yailene Badia, Silverio LayLCSWA [facility]  [phone]

## 2017-02-23 ENCOUNTER — Ambulatory Visit: Payer: 59 | Admitting: Family Medicine

## 2017-02-23 ENCOUNTER — Encounter: Payer: Self-pay | Admitting: Family Medicine

## 2017-02-23 ENCOUNTER — Ambulatory Visit: Payer: Self-pay | Admitting: *Deleted

## 2017-02-23 VITALS — BP 132/68 | HR 94 | Temp 99.3°F | Ht 65.0 in | Wt 158.2 lb

## 2017-02-23 DIAGNOSIS — F1010 Alcohol abuse, uncomplicated: Secondary | ICD-10-CM

## 2017-02-23 DIAGNOSIS — R102 Pelvic and perineal pain: Secondary | ICD-10-CM | POA: Diagnosis not present

## 2017-02-23 DIAGNOSIS — K219 Gastro-esophageal reflux disease without esophagitis: Secondary | ICD-10-CM | POA: Diagnosis not present

## 2017-02-23 DIAGNOSIS — Z22322 Carrier or suspected carrier of Methicillin resistant Staphylococcus aureus: Secondary | ICD-10-CM

## 2017-02-23 DIAGNOSIS — I10 Essential (primary) hypertension: Secondary | ICD-10-CM

## 2017-02-23 DIAGNOSIS — R1013 Epigastric pain: Secondary | ICD-10-CM

## 2017-02-23 DIAGNOSIS — F10939 Alcohol use, unspecified with withdrawal, unspecified: Secondary | ICD-10-CM

## 2017-02-23 DIAGNOSIS — F10239 Alcohol dependence with withdrawal, unspecified: Secondary | ICD-10-CM | POA: Diagnosis not present

## 2017-02-23 DIAGNOSIS — R1012 Left upper quadrant pain: Secondary | ICD-10-CM | POA: Diagnosis not present

## 2017-02-23 DIAGNOSIS — Z8719 Personal history of other diseases of the digestive system: Secondary | ICD-10-CM | POA: Diagnosis not present

## 2017-02-23 DIAGNOSIS — R079 Chest pain, unspecified: Secondary | ICD-10-CM | POA: Diagnosis not present

## 2017-02-23 DIAGNOSIS — F418 Other specified anxiety disorders: Secondary | ICD-10-CM

## 2017-02-23 MED ORDER — CLONAZEPAM 0.5 MG PO TABS: 0.5000 mg | ORAL_TABLET | Freq: Two times a day (BID) | ORAL | 1 refills | Status: DC | PRN

## 2017-02-23 MED ORDER — OMEPRAZOLE 20 MG PO CPDR: 20.0000 mg | DELAYED_RELEASE_CAPSULE | Freq: Every day | ORAL | 5 refills | Status: DC

## 2017-02-23 NOTE — Assessment & Plan Note (Signed)
Recent w/d in hospital/no seizures  Reviewed hospital records, lab results and studies in detail   Px klonopin for prn use #30 for 15 day supply  Ref to psychiatry  rec she consult the resources that SW gave her -and start AA as soon as possible Now states she drinks 2 beers per day- but all etoh removed from the house today  Disc s/s of wd to watch for

## 2017-02-23 NOTE — Assessment & Plan Note (Signed)
Diagnosed in the hospital Pt is finishing course of nasal bactroban

## 2017-02-23 NOTE — Assessment & Plan Note (Signed)
Reviewed hospital records, lab results and studies in detail   R/o for cardiac source- was tx with anti emetics and GI cocktail during etoh w/d Continue omeprazole Pt asked for pain medication several times

## 2017-02-23 NOTE — Patient Instructions (Addendum)
Take a look at materials at home regarding starting AA as soon as possible  Go to first meeting immediately !!  Start klonopin one pill twice daily as needed for moderate to severe anxiety  Do stop all alcohol immediately (beer/liquor/wine) Keep drinking fluids Keep taking your vitamins  Eat a bland diet  Continue the omeprazole for your stomach   If symptoms suddenly worsen- get to the emergency room   Keep talking to your counselor at work   I will do an urgent referral to psychiatry and we will call you tomorrow about that

## 2017-02-23 NOTE — Assessment & Plan Note (Signed)
Continue omeprazole Reviewed hospital records, lab results and studies in detail   Lipase was nl -but would re check if worsens in light of etoh abuse and past pancreatitis

## 2017-02-23 NOTE — Assessment & Plan Note (Signed)
More severe-since loss of her mother in 2012  Recently hosp for etoh abuse and w/d Reviewed stressors/ coping techniques/symptoms/ support sources/ tx options and side effects in detail today Tearful today  Has counselor at church No SI Urgent ref to psychiatry (she does not want to return to Dr Delle ReiningKaurr) --is is after hours-will do tomorrow Px 15 d of klonopin 0.5 mg bid for anxiety -if worse will go to ED Disc resources for etoh cessation

## 2017-02-23 NOTE — Progress Notes (Signed)
Subjective:    Patient ID: Nicole RossettiMargaret M Hughes, female    DOB: 1970-11-30, 47 y.o.   MRN: [id number]  HPI Here for f/u of hospitalization for alcoholism /etoh withdrawal and chest pain (due to vomiting) 02/16/17 to 02/18/17  She was tx during w/d with benzodiazapine  Social worker consulted to give info for local resources for etoh  Also AA Antiemetics and Gi cocktail were helpful - and she was d/c on omeprazole (lost it and needs new px)  She did have episode of hypotension in hospital due to medication-this resolved   Low K and Na were corrected  hctz was held   Lab Results  Component Value Date   CREATININE 0.67 02/18/2017   BUN 6 02/18/2017   NA 137 02/18/2017   K 3.7 02/18/2017   CL 106 02/18/2017   CO2 23 02/18/2017   Lab Results  Component Value Date   ALT 47 02/17/2017   AST 52 (H) 02/17/2017   ALKPHOS 32 (L) 02/17/2017   BILITOT 0.6 02/17/2017    [facility]  Lab Results  Component Value Date   WBC 3.9 (L) 02/18/2017   HGB 10.6 (L) 02/18/2017   HCT 31.9 (L) 02/18/2017   MCV 90.1 02/18/2017   PLT 146 (L) 02/18/2017     Given thiamine and folic acid   Wt Readings from Last 3 Encounters:  02/23/17 158 lb 4 oz (71.8 kg)  02/18/17 158 lb 4.6 oz (71.8 kg)  09/22/16 141 lb 8 oz (64.2 kg)   26.33 kg/m   BP Readings from Last 3 Encounters:  02/23/17 132/68  02/18/17 (!) 146/88  01/03/17 (!) 181/106   Pulse Readings from Last 3 Encounters:  02/23/17 94  02/18/17 96  01/03/17 78    She [facility] [place] [facility] [place] (marriage is better)   Thinks she needs help with her drinking  Was given name of a doctor for detox (dr Norville HaggardHarkin ?- [place] [facility]) - ? Pain management when we google him   Has # to get in AA meeting    Is interested in psychiatry  She did see psychiatry after she lost her mother - saw Dr Delle ReiningKaurr (did not go well- on may medicines and cannot remember what -incl xanax and valium) was diag with  bipolar and she disagrees with that  Thinks she has been on paxil in the past    Has beer at her house  2 beers per night since Saturday    (husband does not drink in the house and is supportive) Before hospital she drank liquor - 7 glasses per day  No other etoh  Last drink was Sunday - before she went to the hospital Monday  She craves it  Anxiety is severe (still blames herself for her mother's death)  Has not used marjiuana lately   Having pain in abdomen/ back and up into her neck and head  Some ibuprofen Avoiding tylenol  Wants to be re tested for pancreatitis if this does not improved- but ok in the hospital   Has chronic pain from her endometriosis  Also back pain  Mild nausea (not like pancreatitis)  Lab Results  Component Value Date   LIPASE 39 02/16/2017    Lab Results  Component Value Date   WBC 3.9 (L) 02/18/2017   HGB 10.6 (L) 02/18/2017   HCT 31.9 (L) 02/18/2017   MCV 90.1 02/18/2017   PLT 146 (L) 02/18/2017   Patient Active  Problem List   Diagnosis Date Noted  . Depression with anxiety 02/23/2017  . 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 Hyponatremia 02/17/2017  . Alcohol withdrawal (HCC) 02/17/2017  . Hypokalemia 09/24/2016  . Alcohol abuse 09/22/2016  . H/O acute pancreatitis 09/22/2016  . GERD (gastroesophageal reflux disease) 04/09/2015  . Abdominal pain, epigastric 01/17/2014  . 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 Chest pain 08/20/2012  . Stress reaction 07/23/2012  . Essential hypertension 06/09/2012  . History of pulmonary embolism 06/09/2012  . Preop cardiovascular exam 06/08/2012  . Dysmenorrhea   . Dyspareunia   . Pelvic pain   . Cyst of ovary    Past Medical History:  Diagnosis Date  . Anxiety   . Cardiomyopathy (HCC)   . Chronic lower back pain   . Complication of anesthesia    "hard to get under"  . Cyst of ovary   . Depression    hx  . DVT complicating pregnancy 08/1998   RLE; RUE  . Dysmenorrhea   . Dyspareunia   .  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 GERD (gastroesophageal reflux disease)   . Hay fever    "fall and spring" (02/17/2017)  . Headache    "2-3 times/.wik" (02/17/2017)  . History of chicken pox   . Hypertension   . Migraine    "monthly" (02/17/2017)  . Pelvic pain   . Pulmonary embolism (HCC) 08/1998   S/P childbirth   Past Surgical History:  Procedure Laterality Date  . CESAREAN SECTION  1994  . ECTOPIC PREGNANCY SURGERY  2010  . LAPAROSCOPY N/A 09/24/2012   Procedure: OPERATIVE LAPROSCOPY WITH LYSIS OF ADHESIONS;  Surgeon: Sherron Monday, MD;  Location: [facility];  Service: Gynecology;  Laterality: N/A;  . TONSILLECTOMY  1984  . TUBAL LIGATION  2010   Social History   Tobacco Use  . Smoking status: Never Smoker  . Smokeless tobacco: Never Used  Substance Use Topics  . Alcohol use: Yes    Alcohol/week: 3.0 oz    Types: 5 Cans of beer per week    Comment: 02/17/2017 "2-3 drinks/day til 2 days ago then stopped"  . Drug use: No   Family History  Problem Relation Age of Onset  . Cancer - Lung Mother   . Heart failure Father   . Hypertension Father    No Known Allergies Current Outpatient Medications on File Prior to Visit  Medication Sig Dispense Refill  . amLODipine (NORVASC) 5 MG tablet Take 1 tablet (5 mg total) by mouth daily. 30 tablet 5  . folic acid (FOLVITE) 1 MG tablet Take 1 tablet (1 mg total) by mouth daily. 30 tablet 0  . lisinopril (PRINIVIL,ZESTRIL) 10 MG tablet Take 1 tablet (10 mg total) by mouth daily. 30 tablet 5  . Multiple Vitamin (MULTIVITAMIN WITH MINERALS) TABS tablet Take 1 tablet by mouth daily. 30 tablet 0  . mupirocin ointment (BACTROBAN) 2 % Place 1 application into the nose 2 (two) times daily. 22 g 0  . thiamine 100 MG tablet Take 1 tablet (100 mg total) by mouth daily. 30 tablet 0   No current facility-administered medications on file prior to visit.      Review of Systems  Constitutional: Positive for appetite change and fatigue.  Negative for activity change, fever and unexpected weight change.  HENT: Negative for congestion, ear pain, rhinorrhea, sinus pressure, sore throat and trouble swallowing.   Eyes: Negative for pain, redness and visual disturbance.  Respiratory: Negative for cough, shortness of  breath, wheezing and stridor.   Cardiovascular: Positive for chest pain. Negative for palpitations and leg swelling.  Gastrointestinal: Positive for abdominal pain. Negative for anal bleeding, blood in stool, constipation, diarrhea, nausea, rectal pain and vomiting.       Ongoing mid/upper abd pain ref to back  Also refers up neck into posterior head  Endocrine: Negative for polydipsia and polyuria.  Genitourinary: Negative for decreased urine volume, dysuria, frequency and urgency.  Musculoskeletal: Negative for arthralgias, back pain and myalgias.  Skin: Negative for pallor and rash.  Allergic/Immunologic: Negative for environmental allergies.  Neurological: Negative for dizziness, seizures, syncope and headaches.  Hematological: Negative for adenopathy. Does not bruise/bleed easily.  Psychiatric/Behavioral: Positive for decreased concentration and sleep disturbance. Negative for dysphoric mood, self-injury and suicidal ideas. The patient is nervous/anxious.        Objective:   Physical Exam  Constitutional: She appears well-developed and well-nourished. No distress.  Tearful female sitting in wheelchair   HENT:  Head: Normocephalic and atraumatic.  Mouth/Throat: Oropharynx is clear and moist. No oropharyngeal exudate.  Eyes: Conjunctivae and EOM are normal. Pupils are equal, round, and reactive to light. Right eye exhibits no discharge. Left eye exhibits no discharge. No scleral icterus.  Neck: Normal range of motion. Neck supple. No JVD present. Carotid bruit is not present. No thyromegaly present.  Cardiovascular: Normal rate, regular rhythm, normal heart sounds and intact distal pulses. Exam reveals no gallop.   Pulmonary/Chest: Effort normal and breath sounds normal. No respiratory distress. She has no wheezes. She has no rales.  No crackles  Abdominal: Soft. Bowel sounds are normal. She exhibits no distension, no abdominal bruit and no mass. There is tenderness. There is no rebound and no guarding.  Epigastric tenderness-sitting Pelvic tenderness-baseline  Musculoskeletal: She exhibits no edema.  Pt has "pain all over" to walk or move in any way  Lymphadenopathy:    She has no cervical adenopathy.  Neurological: She is alert. She has normal reflexes. She displays tremor. No cranial nerve deficit. She exhibits normal muscle tone. Coordination normal.  Bilateral hand tremor  Skin: Skin is warm and dry. No rash noted. No pallor.  No jaundice  Psychiatric: Her speech is normal. Her mood appears anxious. Her affect is labile. Thought content is not paranoid. She exhibits a depressed mood. She expresses no homicidal and no suicidal ideation.  Pt is attentive Tearful at times - then calm/then very tearful when discussing pain /asking for pain medication   Jittery/very anxious          Assessment & Plan:   Problem List Items Addressed This Visit      Cardiovascular and Mediastinum   Essential hypertension    bp is stable post hosp for w/d from etoh  hctz being held for hypotension  Labs rev  Continue amlodipine and lisinopril         Digestive   GERD (gastroesophageal reflux disease)    Refilled omeprazole px in the hospital  Recommend daily in am  Bland diet as tolerated  Avoid etoh       Relevant Medications   omeprazole (PRILOSEC) 20 MG capsule     Nervous and Auditory   Alcohol withdrawal (HCC)    Reviewed hospital records, lab results and studies in detail   S/p w/d Pt states she has had 2 beers per day since she returned home- but plans on quitting today with no etoh in the house  Husband is supportive but did not come into the visit  Disc resources for etoh cessation  incl AA Ref to psychiatry         Other   Abdominal pain, epigastric    Continue omeprazole Reviewed hospital records, lab results and studies in detail   Lipase was nl -but would re check if worsens in light of etoh abuse and past pancreatitis      Alcohol abuse    Recent w/d in hospital/no seizures  Reviewed hospital records, lab results and studies in detail   Px klonopin for prn use #30 for 15 day supply  Ref to psychiatry  rec she consult the resources that SW gave her -and start AA as soon as possible Now states she drinks 2 beers per day- but all etoh removed from the house today  Disc s/s of wd to watch for       Chest pain    Reviewed hospital records, lab results and studies in detail   R/o for cardiac source- was tx with anti emetics and GI cocktail during etoh w/d Continue omeprazole Pt asked for pain medication several times        Depression with anxiety - Primary    More severe-since loss of her mother in 2012  Recently hosp for etoh abuse and w/d Reviewed stressors/ coping techniques/symptoms/ support sources/ tx options and side effects in detail today Tearful today  Has counselor at church No SI Urgent ref to psychiatry (she does not want to return to Dr Delle Reining) --is is after hours-will do tomorrow Px 15 d of klonopin 0.5 mg bid for anxiety -if worse will go to ED Disc resources for etoh cessation       Relevant Orders   Ambulatory referral to Psychiatry   H/O acute pancreatitis    Lipase was nl this hospitalization      MRSA carrier    Diagnosed in the hospital Pt is finishing course of nasal bactroban      Pelvic pain    Ongoing -pending f/u with gyn for endometriosis       Other Visit Diagnoses    Abdominal pain, left upper quadrant       Relevant Medications   omeprazole (PRILOSEC) 20 MG capsule

## 2017-02-23 NOTE — Assessment & Plan Note (Signed)
Reviewed hospital records, lab results and studies in detail   S/p w/d Pt states she has had 2 beers per day since she returned home- but plans on quitting today with no etoh in the house  Husband is supportive but did not come into the visit Disc resources for etoh cessation incl AA Ref to psychiatry

## 2017-02-23 NOTE — Assessment & Plan Note (Signed)
bp is stable post hosp for w/d from etoh  hctz being held for hypotension  Labs rev  Continue amlodipine and lisinopril

## 2017-02-23 NOTE — Telephone Encounter (Signed)
Pt called because of worsening back pain; pt states that she was seen I the ED on 02/16/17; [facility]; pt offered and scheduled, per CreolaRena, with DR Tower today at 1615; pt verbalizes understanding.      Reason for Disposition . High-risk adult (e.g., history of cancer, HIV, or IV drug abuse)  Answer Assessment - Initial Assessment Questions 1. ONSET: "When did the pain begin?"      Seen in ED 02/16/17 2. LOCATION: "Where does it hurt?" (upper, mid or lower back)     Entire back 3. SEVERITY: "How bad is the pain?"  (e.g., Scale 1-10; mild, moderate, or severe)   - MILD (1-3): doesn't interfere with normal activities    - MODERATE (4-7): interferes with normal activities or awakens from sleep    - SEVERE (8-10): excruciating pain, unable to do any normal activities      severe 4. PATTERN: "Is the pain constant?" (e.g., yes, no; constant, intermittent)      constant 5. RADIATION: "Does the pain shoot into your legs or elsewhere?"     no 6. CAUSE:  "What do you think is causing the back pain?"      drinking liquor for years probably damaged her kidney and liver 7. BACK OVERUSE:  "Any recent lifting of heavy objects, strenuous work or exercise?"     no 8. MEDICATIONS: "What have you taken so far for the pain?" (e.g., nothing, acetaminophen, NSAIDS)     no 9. NEUROLOGIC SYMPTOMS: "Do you have any weakness, numbness, or problems with bowel/bladder control?"     no 10. OTHER SYMPTOMS: "Do you have any other symptoms?" (e.g., fever, abdominal pain, burning with urination, blood in urine)       no 11. PREGNANCY: "Is there any chance you are pregnant?" (e.g., yes, no; LMP)       no  Protocols used: BACK PAIN-A-AH

## 2017-02-23 NOTE — Assessment & Plan Note (Signed)
Ongoing -pending f/u with gyn for endometriosis

## 2017-02-23 NOTE — Assessment & Plan Note (Signed)
Refilled omeprazole px in the hospital  Recommend daily in am  Bland diet as tolerated  Avoid etoh

## 2017-02-23 NOTE — Assessment & Plan Note (Signed)
Lipase was nl this hospitalization

## 2017-02-24 ENCOUNTER — Telehealth (HOSPITAL_COMMUNITY): Payer: Self-pay | Admitting: Psychology

## 2017-02-25 ENCOUNTER — Inpatient Hospital Stay: Payer: Self-pay | Admitting: Family Medicine

## 2017-04-15 ENCOUNTER — Emergency Department (HOSPITAL_COMMUNITY): Payer: 59

## 2017-04-15 ENCOUNTER — Emergency Department (HOSPITAL_COMMUNITY)
Admission: EM | Admit: 2017-04-15 | Discharge: 2017-04-15 | Disposition: A | Discharge status: Home / Self Care | Payer: 59 | Attending: Emergency Medicine | Admitting: Emergency Medicine

## 2017-04-15 ENCOUNTER — Encounter (HOSPITAL_COMMUNITY): Payer: Self-pay | Admitting: Emergency Medicine

## 2017-04-15 DIAGNOSIS — K2900 Acute gastritis without bleeding: Secondary | ICD-10-CM | POA: Insufficient documentation

## 2017-04-15 DIAGNOSIS — Z79899 Other long term (current) drug therapy: Secondary | ICD-10-CM | POA: Insufficient documentation

## 2017-04-15 DIAGNOSIS — Z86718 Personal history of other venous thrombosis and embolism: Secondary | ICD-10-CM | POA: Diagnosis not present

## 2017-04-15 DIAGNOSIS — R51 Headache: Secondary | ICD-10-CM | POA: Insufficient documentation

## 2017-04-15 DIAGNOSIS — R112 Nausea with vomiting, unspecified: Secondary | ICD-10-CM | POA: Diagnosis not present

## 2017-04-15 DIAGNOSIS — I10 Essential (primary) hypertension: Secondary | ICD-10-CM | POA: Diagnosis not present

## 2017-04-15 DIAGNOSIS — R1013 Epigastric pain: Secondary | ICD-10-CM | POA: Diagnosis not present

## 2017-04-15 DIAGNOSIS — Z86711 Personal history of pulmonary embolism: Secondary | ICD-10-CM | POA: Diagnosis not present

## 2017-04-15 DIAGNOSIS — K529 Noninfective gastroenteritis and colitis, unspecified: Secondary | ICD-10-CM | POA: Diagnosis not present

## 2017-04-15 DIAGNOSIS — R079 Chest pain, unspecified: Secondary | ICD-10-CM | POA: Diagnosis not present

## 2017-04-15 LAB — CBC
HCT: 41.6 % (ref 36.0–46.0)
Hemoglobin: 14.4 g/dL (ref 12.0–15.0)
MCH: 30.5 pg (ref 26.0–34.0)
MCHC: 34.6 g/dL (ref 30.0–36.0)
MCV: 88.1 fL (ref 78.0–100.0)
Platelets: 233 10*3/uL (ref 150–400)
RBC: 4.72 MIL/uL (ref 3.87–5.11)
RDW: 12.9 % (ref 11.5–15.5)
WBC: 7.2 10*3/uL (ref 4.0–10.5)

## 2017-04-15 LAB — URINALYSIS, ROUTINE W REFLEX MICROSCOPIC
Bilirubin Urine: NEGATIVE
Glucose, UA: NEGATIVE mg/dL
Hgb urine dipstick: NEGATIVE
Ketones, ur: NEGATIVE mg/dL
Leukocytes, UA: NEGATIVE
Nitrite: NEGATIVE
Protein, ur: NEGATIVE mg/dL
Specific Gravity, Urine: 1.009 (ref 1.005–1.030)
pH: 6 (ref 5.0–8.0)

## 2017-04-15 LAB — COMPREHENSIVE METABOLIC PANEL
ALT: 40 U/L (ref 14–54)
AST: 65 U/L — ABNORMAL HIGH (ref 15–41)
Albumin: 4.3 g/dL (ref 3.5–5.0)
Alkaline Phosphatase: 41 U/L (ref 38–126)
Anion gap: 12 (ref 5–15)
BUN: 7 mg/dL (ref 6–20)
CO2: 22 mmol/L (ref 22–32)
Calcium: 9 mg/dL (ref 8.9–10.3)
Chloride: 104 mmol/L (ref 101–111)
Creatinine, Ser: 0.68 mg/dL (ref 0.44–1.00)
GFR calc Af Amer: >60 mL/min (ref >60)
GFR calc non Af Amer: >60 mL/min (ref >60)
Glucose, Bld: 98 mg/dL (ref 65–99)
Potassium: 3.4 mmol/L — ABNORMAL LOW (ref 3.5–5.1)
Sodium: 138 mmol/L (ref 135–145)
Total Bilirubin: 0.7 mg/dL (ref 0.3–1.2)
Total Protein: 7.4 g/dL (ref 6.5–8.1)

## 2017-04-15 LAB — LIPASE, BLOOD: Lipase: 42 U/L (ref 11–51)

## 2017-04-15 LAB — I-STAT BETA HCG BLOOD, ED (MC, WL, AP ONLY): I-stat hCG, quantitative: <5 m[IU]/mL (ref <5)

## 2017-04-15 LAB — TROPONIN I: Troponin I: <0.03 ng/mL (ref <0.03)

## 2017-04-15 MED ORDER — GI COCKTAIL ~~LOC~~
30.0000 mL | Freq: Once | ORAL | Status: AC
  Administered 2017-04-15: 30 mL via ORAL
  Filled 2017-04-15: qty 30

## 2017-04-15 MED ORDER — SODIUM CHLORIDE 0.9 % IV BOLUS
1000.0000 mL | Freq: Once | INTRAVENOUS | Status: AC
  Administered 2017-04-15: 1000 mL via INTRAVENOUS

## 2017-04-15 MED ORDER — DIPHENHYDRAMINE HCL 50 MG/ML IJ SOLN
25.0000 mg | Freq: Once | INTRAMUSCULAR | Status: AC
  Administered 2017-04-15: 25 mg via INTRAVENOUS
  Filled 2017-04-15: qty 1

## 2017-04-15 MED ORDER — METHOCARBAMOL 1000 MG/10ML IJ SOLN: 1000.0000 mg | Freq: Once | INTRAMUSCULAR | Status: DC

## 2017-04-15 MED ORDER — PANTOPRAZOLE SODIUM 40 MG IV SOLR
40.0000 mg | Freq: Once | INTRAVENOUS | Status: AC
  Administered 2017-04-15: 40 mg via INTRAVENOUS
  Filled 2017-04-15: qty 40

## 2017-04-15 MED ORDER — DICYCLOMINE HCL 10 MG/ML IM SOLN
20.0000 mg | Freq: Once | INTRAMUSCULAR | Status: AC
  Administered 2017-04-15: 20 mg via INTRAMUSCULAR
  Filled 2017-04-15: qty 2

## 2017-04-15 MED ORDER — ONDANSETRON HCL 4 MG/2ML IJ SOLN
4.0000 mg | Freq: Once | INTRAMUSCULAR | Status: AC
  Administered 2017-04-15: 4 mg via INTRAVENOUS
  Filled 2017-04-15: qty 2

## 2017-04-15 MED ORDER — HALOPERIDOL LACTATE 5 MG/ML IJ SOLN
3.0000 mg | Freq: Once | INTRAMUSCULAR | Status: AC
  Administered 2017-04-15: 3 mg via INTRAVENOUS
  Filled 2017-04-15: qty 1

## 2017-04-15 MED ORDER — MAGNESIUM SULFATE 2 GM/50ML IV SOLN
2.0000 g | Freq: Once | INTRAVENOUS | Status: AC
  Administered 2017-04-15: 2 g via INTRAVENOUS
  Filled 2017-04-15: qty 50

## 2017-04-15 MED ORDER — DICYCLOMINE HCL 20 MG PO TABS: 20.0000 mg | ORAL_TABLET | Freq: Two times a day (BID) | ORAL | 0 refills | Status: DC | PRN

## 2017-04-15 MED ORDER — METHOCARBAMOL 1000 MG/10ML IJ SOLN
1000.0000 mg | Freq: Once | INTRAVENOUS | Status: AC
  Administered 2017-04-15: 1000 mg via INTRAVENOUS
  Filled 2017-04-15: qty 10

## 2017-04-15 MED ORDER — CYCLOBENZAPRINE HCL 10 MG PO TABS: 10.0000 mg | ORAL_TABLET | Freq: Two times a day (BID) | ORAL | 0 refills | Status: DC | PRN

## 2017-04-15 MED ORDER — KETOROLAC TROMETHAMINE 30 MG/ML IJ SOLN
30.0000 mg | Freq: Once | INTRAMUSCULAR | Status: AC
  Administered 2017-04-15: 30 mg via INTRAVENOUS
  Filled 2017-04-15: qty 1

## 2017-04-15 MED ORDER — PANTOPRAZOLE SODIUM 20 MG PO TBEC: 40.0000 mg | DELAYED_RELEASE_TABLET | Freq: Every day | ORAL | 0 refills | Status: DC

## 2017-04-15 MED ORDER — PROMETHAZINE HCL 25 MG PO TABS: 25.0000 mg | ORAL_TABLET | Freq: Four times a day (QID) | ORAL | 0 refills | Status: DC | PRN

## 2017-04-15 MED ORDER — PROCHLORPERAZINE EDISYLATE 5 MG/ML IJ SOLN
10.0000 mg | Freq: Once | INTRAMUSCULAR | Status: AC
  Administered 2017-04-15: 10 mg via INTRAVENOUS
  Filled 2017-04-15: qty 2

## 2017-04-15 MED ORDER — ONDANSETRON 4 MG PO TBDP
4.0000 mg | ORAL_TABLET | Freq: Once | ORAL | Status: AC | PRN
  Administered 2017-04-15: 4 mg via ORAL
  Filled 2017-04-15: qty 1

## 2017-04-15 NOTE — ED Notes (Signed)
Bed: WA05 Expected date:  Expected time:  Means of arrival:  Comments: 

## 2017-04-15 NOTE — ED Notes (Signed)
ED Provider at bedside. 

## 2017-04-15 NOTE — ED Provider Notes (Signed)
[facility]-EMERGENCY DEPT Provider Note   CSN: [id number] Arrival date & time: 04/15/17  0700     History   Chief Complaint Chief Complaint  Patient presents with  . Abdominal Pain  . Emesis  . Diarrhea    HPI Nicole Hughes is a 47 y.o. female.  HPI   Sunday constant nausea, can't eat Feels like pancreatitis When eat has diarrhea Abdominal pain,worse with eating, epigastrium to sides bilaterally Feels like a sharp pain, back and chest pain is constant Also trying to quit drinking, last drink was during hospitalization in March Nausea, mild emesis Diarrhea 4-5 times per day since Sunday, thrown up 4 times No known sick contacts    Past Medical History:  Diagnosis Date  . Anxiety   . Cardiomyopathy (HCC)   . Chronic lower back pain   . Complication of anesthesia    "hard to get under"  . Cyst of ovary   . Depression    hx  . DVT complicating pregnancy 08/1998   RLE; RUE  . Dysmenorrhea   . Dyspareunia   . 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 GERD (gastroesophageal reflux disease)   . Hay fever    "fall and spring" (02/17/2017)  . Headache    "2-3 times/.wik" (02/17/2017)  . History of chicken pox   . Hypertension   . Migraine    "monthly" (02/17/2017)  . Pelvic pain   . Pulmonary embolism (HCC) 08/1998   S/P childbirth    Patient Active Problem List   Diagnosis Date Noted  . Depression with anxiety 02/23/2017  . 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 Hyponatremia 02/17/2017  . Alcohol withdrawal (HCC) 02/17/2017  . Hypokalemia 09/24/2016  . Alcohol abuse 09/22/2016  . H/O acute pancreatitis 09/22/2016  . GERD (gastroesophageal reflux disease) 04/09/2015  . Abdominal pain, epigastric 01/17/2014  . 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 Chest pain 08/20/2012  . Stress reaction 07/23/2012  . Essential hypertension 06/09/2012  . History of pulmonary embolism 06/09/2012  . Preop cardiovascular exam 06/08/2012  .  Dysmenorrhea   . Dyspareunia   . Pelvic pain   . Cyst of ovary     Past Surgical History:  Procedure Laterality Date  . CESAREAN SECTION  1994  . ECTOPIC PREGNANCY SURGERY  2010  . LAPAROSCOPY N/A 09/24/2012   Procedure: OPERATIVE LAPROSCOPY WITH LYSIS OF ADHESIONS;  Surgeon: Sherron Monday, MD;  Location: [facility];  Service: Gynecology;  Laterality: N/A;  . TONSILLECTOMY  1984  . TUBAL LIGATION  2010     OB History   None      Home Medications    Prior to Admission medications   Medication Sig Start Date End Date Taking? Authorizing Provider  amLODipine (NORVASC) 5 MG tablet Take 1 tablet (5 mg total) by mouth daily. 09/22/16  Yes Tower, Audrie Gallus, MD  folic acid (FOLVITE) 1 MG tablet Take 1 tablet (1 mg total) by mouth daily. 02/19/17  Yes Berton Mount I, MD  lisinopril (PRINIVIL,ZESTRIL) 10 MG tablet Take 1 tablet (10 mg total) by mouth daily. 09/22/16  Yes Tower, Audrie Gallus, MD  Multiple Vitamin (MULTIVITAMIN WITH MINERALS) TABS tablet Take 1 tablet by mouth daily. 02/19/17  Yes Berton Mount I, MD  omeprazole (PRILOSEC) 20 MG capsule Take 1 capsule (20 mg total) by mouth daily. 02/23/17  Yes Tower, Audrie Gallus, MD  clonazePAM (KLONOPIN) 0.5 MG tablet Take 1 tablet (0.5 mg total) by mouth 2 (two) times daily as needed for  anxiety. Caution of sedation Patient not taking: Reported on 04/15/2017 02/23/17   Tower, Audrie GallusMarne A, MD  mupirocin ointment (BACTROBAN) 2 % Place 1 application into the nose 2 (two) times daily. Patient not taking: Reported on 04/15/2017 02/18/17   Berton Mountgbata, Sylvester I, MD  thiamine 100 MG tablet Take 1 tablet (100 mg total) by mouth daily. Patient not taking: Reported on 04/15/2017 02/19/17   Barnetta Chapelgbata, Sylvester I, MD    Family History Family History  Problem Relation Age of Onset  . Cancer - Lung Mother   . Heart failure Father   . Hypertension Father     Social History Social History   Tobacco Use  . Smoking status: Never Smoker  . Smokeless tobacco: Never Used    Substance Use Topics  . Alcohol use: Yes    Alcohol/week: 3.0 oz    Types: 5 Cans of beer per week    Comment: 02/17/2017 "2-3 drinks/day til 2 days ago then stopped"  . Drug use: No     Allergies   Patient has no known allergies.   Review of Systems Review of Systems  Constitutional: Negative for fever.  HENT: Negative for sore throat.   Eyes: Negative for visual disturbance.  Respiratory: Negative for cough and shortness of breath.   Cardiovascular: Positive for chest pain.  Gastrointestinal: Positive for abdominal pain, diarrhea, nausea and vomiting. Negative for constipation.  Genitourinary: Negative for difficulty urinating and dysuria.  Musculoskeletal: Positive for back pain and neck pain.  Skin: Negative for rash.  Neurological: Positive for headaches. Negative for syncope.     Physical Exam Updated Vital Signs BP (!) 169/95   Pulse 81   Temp 98.4 F (36.9 C) (Oral)   Resp (!) 31   Ht 5\' 5"  (1.651 m)   Wt 72.1 kg (159 lb)   LMP 03/20/2017   SpO2 100%   BMI 26.46 kg/m   Physical Exam  Constitutional: She is oriented to person, place, and time. She appears well-developed and well-nourished. No distress.  HENT:  Head: Normocephalic and atraumatic.  Eyes: Conjunctivae and EOM are normal.  Neck: Normal range of motion.  Cardiovascular: Normal rate, regular rhythm, normal heart sounds and intact distal pulses. Exam reveals no gallop and no friction rub.  No murmur heard. Pulmonary/Chest: Effort normal and breath sounds normal. No respiratory distress. She has no wheezes. She has no rales.  Abdominal: Soft. She exhibits no distension. There is tenderness (mild LUQ). There is no guarding.  Musculoskeletal: She exhibits no edema or tenderness.  Neurological: She is alert and oriented to person, place, and time.  Skin: Skin is warm and dry. No rash noted. She is not diaphoretic. No erythema.  Nursing note and vitals reviewed.    ED Treatments / Results   Labs (all labs ordered are listed, but only abnormal results are displayed) Labs Reviewed  COMPREHENSIVE METABOLIC PANEL - Abnormal; Notable for the following components:      Result Value   Potassium 3.4 (*)    AST 65 (*)    All other components within normal limits  CBC  URINALYSIS, ROUTINE W REFLEX MICROSCOPIC  LIPASE, BLOOD  TROPONIN I  I-STAT BETA HCG BLOOD, ED ([facility], [facility], [facility] ONLY)    EKG EKG Interpretation  Date/Time:  Wednesday April 15 2017 08:54:30 EDT Ventricular Rate:  85 PR Interval:    QRS Duration: 76 QT Interval:  419 QTC Calculation: 499 R Axis:   8 Text Interpretation:  Sinus rhythm Borderline prolonged QT interval  Since prior ECG, rate has decreased Confirmed by Alvira Monday ([id number]) on 04/15/2017 9:14:04 AM Also confirmed by Alvira Monday ([id number]), editor Josephine Igo [id number])  on 04/15/2017 10:04:25 AM   Radiology No results found.  Procedures Procedures (including critical care time)  Medications Ordered in ED Medications  ondansetron (ZOFRAN-ODT) disintegrating tablet 4 mg (4 mg Oral Given 04/15/17 0719)  sodium chloride 0.9 % bolus 1,000 mL (0 mLs Intravenous Stopped 04/15/17 1009)  ondansetron (ZOFRAN) injection 4 mg (4 mg Intravenous Given 04/15/17 0850)  dicyclomine (BENTYL) injection 20 mg (20 mg Intramuscular Given 04/15/17 0849)  pantoprazole (PROTONIX) injection 40 mg (40 mg Intravenous Given 04/15/17 0850)  gi cocktail (Maalox,Lidocaine,Donnatal) (30 mLs Oral Given 04/15/17 0850)  methocarbamol (ROBAXIN) 1,000 mg in dextrose 5 % 50 mL IVPB (0 mg Intravenous Stopped 04/15/17 1009)     Initial Impression / Assessment and Plan / ED Course  I have reviewed the triage vital signs and the nursing notes.  Pertinent labs & imaging results that were available during my care of the patient were reviewed by me and considered in my medical decision making (see chart for details).     47 year old female with history above, including history of  hypertension, prior pulmonary embolus during pregnancy, now off anticoagulation, alcohol abuse and pancreatitis, who has not used alcohol since March, who presents with concern for abdominal pain, nausea, vomiting and diarrhea.  Patient has left upper quadrant abdominal tenderness on exam, without right upper quadrant tenderness, negative Murphy sign, low suspicion for cholecystitis by history and physical exam.  Her lipase is within normal limits, without signs of pancreatitis.  Very mild elevation in AST, without other abnormalities of transaminases.  Reported chest pain, EKG and troponin were done which did not show significant findings.  Have low suspicion for aortic dissection by history, physical and exam, duration of symptoms for 4 days with normal vital signs. Suspect CP related to gastritis/esophagitis. Pregnancy test negative.  Urinalysis without infection.  Given history of left upper quadrant pain, vomiting, and diarrhea, suspect most likely viral gastro enteritis, with viral gastritis, or possible peptic ulcer disease.  Patient was given Bentyl, pantoprazole, GI cocktail and Zofran in the emergency department.  Also describes some pain through her shoulders, and neck, and was given IV Robaxin.    Reports continued headache given headache cocktail with improvement. Doubt SAH, meningitis by history and exam.  Recommend continued supportive care, gave rx for phenergan, protonix, flexeril, bentyl. Patient discharged in stable condition with understanding of reasons to return.    Final Clinical Impressions(s) / ED Diagnoses   Final diagnoses:  Acute gastritis without hemorrhage, unspecified gastritis type  Gastroenteritis  Epigastric pain    ED Discharge Orders    None       Alvira Monday, MD 04/15/17 2210

## 2017-04-15 NOTE — ED Triage Notes (Signed)
Pt reports that she recently quit drinking and since Sunday she had RUQ pain that radiates to her back with n/v/d. Reports Hx of pancreatitis and these symptoms are similar.

## 2017-05-23 ENCOUNTER — Emergency Department (HOSPITAL_COMMUNITY): Payer: 59

## 2017-05-23 ENCOUNTER — Other Ambulatory Visit: Payer: Self-pay

## 2017-05-23 ENCOUNTER — Encounter (HOSPITAL_COMMUNITY): Payer: Self-pay | Admitting: Emergency Medicine

## 2017-05-23 ENCOUNTER — Emergency Department (HOSPITAL_COMMUNITY)
Admission: EM | Admit: 2017-05-23 | Discharge: 2017-05-24 | Disposition: A | Discharge status: Home / Self Care | Payer: 59 | Attending: Emergency Medicine | Admitting: Emergency Medicine

## 2017-05-23 DIAGNOSIS — Z79899 Other long term (current) drug therapy: Secondary | ICD-10-CM | POA: Diagnosis not present

## 2017-05-23 DIAGNOSIS — R079 Chest pain, unspecified: Secondary | ICD-10-CM | POA: Diagnosis not present

## 2017-05-23 DIAGNOSIS — R1011 Right upper quadrant pain: Secondary | ICD-10-CM | POA: Insufficient documentation

## 2017-05-23 DIAGNOSIS — R109 Unspecified abdominal pain: Secondary | ICD-10-CM | POA: Diagnosis not present

## 2017-05-23 DIAGNOSIS — I10 Essential (primary) hypertension: Secondary | ICD-10-CM | POA: Insufficient documentation

## 2017-05-23 DIAGNOSIS — R0789 Other chest pain: Secondary | ICD-10-CM | POA: Diagnosis present

## 2017-05-23 DIAGNOSIS — R0602 Shortness of breath: Secondary | ICD-10-CM | POA: Diagnosis not present

## 2017-05-23 LAB — BASIC METABOLIC PANEL
Anion gap: 12 (ref 5–15)
BUN: 10 mg/dL (ref 6–20)
CO2: 21 mmol/L — ABNORMAL LOW (ref 22–32)
Calcium: 8.8 mg/dL — ABNORMAL LOW (ref 8.9–10.3)
Chloride: 105 mmol/L (ref 101–111)
Creatinine, Ser: 0.61 mg/dL (ref 0.44–1.00)
GFR calc Af Amer: >60 mL/min (ref >60)
GFR calc non Af Amer: >60 mL/min (ref >60)
Glucose, Bld: 92 mg/dL (ref 65–99)
Potassium: 3.4 mmol/L — ABNORMAL LOW (ref 3.5–5.1)
Sodium: 138 mmol/L (ref 135–145)

## 2017-05-23 LAB — CBC
HCT: 36.9 % (ref 36.0–46.0)
Hemoglobin: 12.2 g/dL (ref 12.0–15.0)
MCH: 29.3 pg (ref 26.0–34.0)
MCHC: 33.1 g/dL (ref 30.0–36.0)
MCV: 88.7 fL (ref 78.0–100.0)
Platelets: 217 10*3/uL (ref 150–400)
RBC: 4.16 MIL/uL (ref 3.87–5.11)
RDW: 13.7 % (ref 11.5–15.5)
WBC: 7.9 10*3/uL (ref 4.0–10.5)

## 2017-05-23 LAB — LIPASE, BLOOD: Lipase: 32 U/L (ref 11–51)

## 2017-05-23 LAB — I-STAT BETA HCG BLOOD, ED (MC, WL, AP ONLY): I-stat hCG, quantitative: <5 m[IU]/mL (ref <5)

## 2017-05-23 LAB — I-STAT TROPONIN, ED
Troponin i, poc: 0 ng/mL (ref 0.00–0.08)
Troponin i, poc: 0.02 ng/mL (ref 0.00–0.08)

## 2017-05-23 LAB — D-DIMER, QUANTITATIVE (NOT AT ARMC): D-Dimer, Quant: <0.27 ug/mL-FEU (ref 0.00–0.50)

## 2017-05-23 LAB — URINALYSIS, ROUTINE W REFLEX MICROSCOPIC
Bilirubin Urine: NEGATIVE
Glucose, UA: NEGATIVE mg/dL
Ketones, ur: NEGATIVE mg/dL
Leukocytes, UA: NEGATIVE
Nitrite: NEGATIVE
Protein, ur: NEGATIVE mg/dL
Specific Gravity, Urine: 1.003 — ABNORMAL LOW (ref 1.005–1.030)
pH: 6 (ref 5.0–8.0)

## 2017-05-23 LAB — HEPATIC FUNCTION PANEL
ALT: 26 U/L (ref 14–54)
AST: 72 U/L — ABNORMAL HIGH (ref 15–41)
Albumin: 4.7 g/dL (ref 3.5–5.0)
Alkaline Phosphatase: 41 U/L (ref 38–126)
Bilirubin, Direct: 0.6 mg/dL — ABNORMAL HIGH (ref 0.1–0.5)
Indirect Bilirubin: 0.8 mg/dL (ref 0.3–0.9)
Total Bilirubin: 1.4 mg/dL — ABNORMAL HIGH (ref 0.3–1.2)
Total Protein: 7.7 g/dL (ref 6.5–8.1)

## 2017-05-23 MED ORDER — KETOROLAC TROMETHAMINE 15 MG/ML IJ SOLN
15.0000 mg | Freq: Once | INTRAMUSCULAR | Status: AC
Start: 2017-05-24 — End: 2017-05-24
  Administered 2017-05-24: 15 mg via INTRAVENOUS
  Filled 2017-05-23: qty 1

## 2017-05-23 MED ORDER — MORPHINE SULFATE (PF) 4 MG/ML IV SOLN
8.0000 mg | Freq: Once | INTRAVENOUS | Status: AC
  Administered 2017-05-23: 8 mg via INTRAVENOUS
  Filled 2017-05-23: qty 2

## 2017-05-23 NOTE — ED Provider Notes (Addendum)
[facility]-EMERGENCY DEPT Provider Note   CSN: [id number] Arrival date & time: 05/23/17  1631     History   Chief Complaint Chief Complaint  Patient presents with  . Chest Pain    HPI Nicole Hughes is a 47 y.o. female.  HPI   Pt is a 47 year old female with a h/o cardiomyopathy during pregnancy, PE s/p surgical procedure (no longer anticoagulated), anxiety, endometriosis, GERD, HTN, who presents to the ED today c/o left sided chest pain that has been ongoing for the last 4 days.  States that pain initially was intermittent however today her pain became constant.  Radiates into her neck down her arm and into her back.  She states pain is constant and severe.  She took a baby aspirin earlier today with no relief.  States it hurts to take a deep breath.  She denies any diaphoresis.  She does endorse nausea.  She also reports some shortness of breath due to pain.  Patient is also complaining of right flank pain for the last week and periumbilical abdominal pain as well.  Reports no vomiting, diarrhea, constipation or urinary symptoms.  No fevers.  Denies any recent illnesses.  Has a history of alcohol abuse and states she has not drink any alcohol since March 2019.  Denies any other drug use.  Past Medical History:  Diagnosis Date  . Anxiety   . Cardiomyopathy (HCC)   . Chronic lower back pain   . Complication of anesthesia    "hard to get under"  . Cyst of ovary   . Depression    hx  . DVT complicating pregnancy 08/1998   RLE; RUE  . Dysmenorrhea   . Dyspareunia   . 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 GERD (gastroesophageal reflux disease)   . Hay fever    "fall and spring" (02/17/2017)  . Headache    "2-3 times/.wik" (02/17/2017)  . History of chicken pox   . Hypertension   . Migraine    "monthly" (02/17/2017)  . Pelvic pain   . Pulmonary embolism (HCC) 08/1998   S/P childbirth    Patient Active Problem List   Diagnosis Date  Noted  . Depression with anxiety 02/23/2017  . 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 Hyponatremia 02/17/2017  . Alcohol withdrawal (HCC) 02/17/2017  . Hypokalemia 09/24/2016  . Alcohol abuse 09/22/2016  . H/O acute pancreatitis 09/22/2016  . GERD (gastroesophageal reflux disease) 04/09/2015  . Abdominal pain, epigastric 01/17/2014  . 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 Chest pain 08/20/2012  . Stress reaction 07/23/2012  . Essential hypertension 06/09/2012  . History of pulmonary embolism 06/09/2012  . Preop cardiovascular exam 06/08/2012  . Dysmenorrhea   . Dyspareunia   . Pelvic pain   . Cyst of ovary     Past Surgical History:  Procedure Laterality Date  . CESAREAN SECTION  1994  . ECTOPIC PREGNANCY SURGERY  2010  . LAPAROSCOPY N/A 09/24/2012   Procedure: OPERATIVE LAPROSCOPY WITH LYSIS OF ADHESIONS;  Surgeon: Sherron Monday, MD;  Location: [facility];  Service: Gynecology;  Laterality: N/A;  . TONSILLECTOMY  1984  . TUBAL LIGATION  2010     OB History   None      Home Medications    Prior to Admission medications   Medication Sig Start Date End Date Taking? Authorizing Provider  amLODipine (NORVASC) 5 MG tablet Take 1 tablet (5 mg total) by mouth daily. 09/22/16  Yes Tower, Audrie Gallus, MD  lisinopril (PRINIVIL,ZESTRIL) 10 MG tablet Take 1 tablet (10 mg total) by mouth daily. 09/22/16  Yes Tower, Audrie Gallus, MD  Multiple Vitamin (MULTIVITAMIN WITH MINERALS) TABS tablet Take 1 tablet by mouth daily. 02/19/17  Yes Berton Mount I, MD  omeprazole (PRILOSEC) 20 MG capsule Take 1 capsule (20 mg total) by mouth daily. 02/23/17  Yes Tower, Audrie Gallus, MD  acetaminophen (TYLENOL) 325 MG tablet Take 2 tablets (650 mg total) by mouth every 6 (six) hours as needed. Do not take more than  of tylenol per day 05/24/17   Maicy Filip S, PA-C  clonazePAM (KLONOPIN) 0.5 MG tablet Take 1 tablet (0.5 mg total) by mouth 2 (two) times daily as needed for anxiety. Caution of sedation Patient  not taking: Reported on 05/23/2017 02/23/17   Tower, Audrie Gallus, MD  cyclobenzaprine (FLEXERIL) 10 MG tablet Take 1 tablet (10 mg total) by mouth 2 (two) times daily as needed for muscle spasms. Patient not taking: Reported on 05/23/2017 04/15/17   Alvira Monday, MD  dicyclomine (BENTYL) 20 MG tablet Take 1 tablet (20 mg total) by mouth 2 (two) times daily as needed for spasms (abdominal pain). Patient not taking: Reported on 05/23/2017 04/15/17   Alvira Monday, MD  folic acid (FOLVITE) 1 MG tablet Take 1 tablet (1 mg total) by mouth daily. Patient not taking: Reported on 05/23/2017 02/19/17   Berton Mount I, MD  ibuprofen (ADVIL,MOTRIN) 400 MG tablet Take 1 tablet (400 mg total) by mouth every 6 (six) hours as needed. 05/24/17   Lexany Belknap S, PA-C  mupirocin ointment (BACTROBAN) 2 % Place 1 application into the nose 2 (two) times daily. Patient not taking: Reported on 05/23/2017 02/18/17   Barnetta Chapel, MD  promethazine (PHENERGAN) 25 MG tablet Take 1 tablet (25 mg total) by mouth every 6 (six) hours as needed for nausea or vomiting. Patient not taking: Reported on 05/23/2017 04/15/17   Alvira Monday, MD  thiamine 100 MG tablet Take 1 tablet (100 mg total) by mouth daily. Patient not taking: Reported on 05/23/2017 02/19/17   Barnetta Chapel, MD    Family History Family History  Problem Relation Age of Onset  . Cancer - Lung Mother   . Heart failure Father   . Hypertension Father     Social History Social History   Tobacco Use  . Smoking status: Never Smoker  . Smokeless tobacco: Never Used  Substance Use Topics  . Alcohol use: Yes    Alcohol/week: 3.0 oz    Types: 5 Cans of beer per week    Comment: 02/17/2017 "2-3 drinks/day til 2 days ago then stopped"  . Drug use: No     Allergies   Patient has no known allergies.   Review of Systems Review of Systems  Constitutional: Negative for fever.  HENT: Negative for congestion and sore throat.   Eyes: Negative for  visual disturbance.  Respiratory: Positive for shortness of breath. Negative for cough.   Cardiovascular: Positive for chest pain and palpitations. Negative for leg swelling.  Gastrointestinal: Positive for abdominal pain and nausea. Negative for blood in stool, constipation, diarrhea and vomiting.  Genitourinary: Positive for flank pain. Negative for dysuria, frequency, hematuria and urgency.  Musculoskeletal: Positive for neck pain. Negative for back pain.  Skin: Negative for wound.  Neurological: Positive for headaches. Negative for dizziness, syncope, weakness and numbness.    Physical Exam Updated Vital Signs BP (!) 178/99   Pulse 80   Temp 98.3 F (36.8 C) (Oral)  Resp 13   Ht  (1.651 m)   Wt 72.6 kg (160 lb)   LMP 05/14/2017   SpO2 100%   BMI 26.63 kg/m   Physical Exam  Constitutional: She appears well-developed and well-nourished. No distress.  HENT:  Head: Normocephalic and atraumatic.  Eyes: Pupils are equal, round, and reactive to light. Conjunctivae and EOM are normal.  Neck: Normal range of motion. Neck supple. No JVD present.  Mild ttp the left cervical Paraspinous muscles and left trapezius muscles.  Cardiovascular: Normal rate, regular rhythm, intact distal pulses and normal pulses.  No murmur heard. Pulmonary/Chest: Effort normal. No accessory muscle usage or stridor. No tachypnea. No respiratory distress. She has no decreased breath sounds. She has no wheezes. She has no rhonchi. She has no rales.  Abdominal: Soft. Bowel sounds are normal. She exhibits no distension. There is tenderness (periumbilical). There is no guarding.  Musculoskeletal: She exhibits no edema.       Right lower leg: Normal. She exhibits no tenderness and no edema.       Left lower leg: Normal. She exhibits no tenderness and no edema.  Neurological: She is alert.  Mental Status:  Alert, thought content appropriate, able to give a coherent history. Speech fluent without evidence of  aphasia. Able to follow 2 step commands without difficulty.  Cranial Nerves:  II: pupils equal, round, reactive to light III,IV, VI: ptosis not present, extra-ocular motions intact bilaterally  V,VII: smile symmetric, facial light touch sensation equal VIII: hearing grossly normal to voice  X: uvula elevates symmetrically  XI: bilateral shoulder shrug symmetric and strong XII: midline tongue extension without fassiculations Motor:  Normal tone. 5/5 strength of BUE and BLE major muscle groups including strong and equal grip strength and dorsiflexion/plantar flexion Sensory: light touch normal in all extremities. Cerebellar: normal finger-to-nose with bilateral upper extremities, heel to shin normal bilat CV: 2+ radial and DP/PT pulses  Skin: Skin is warm and dry. Capillary refill takes less than 2 seconds.  Psychiatric: She has a normal mood and affect.  Nursing note and vitals reviewed.    ED Treatments / Results  Labs (all labs ordered are listed, but only abnormal results are displayed) Labs Reviewed  BASIC METABOLIC PANEL - Abnormal; Notable for the following components:      Result Value   Potassium 3.4 (*)    CO2 21 (*)    Calcium 8.8 (*)    All other components within normal limits  HEPATIC FUNCTION PANEL - Abnormal; Notable for the following components:   AST 72 (*)    Total Bilirubin 1.4 (*)    Bilirubin, Direct 0.6 (*)    All other components within normal limits  URINALYSIS, ROUTINE W REFLEX MICROSCOPIC - Abnormal; Notable for the following components:   Color, Urine STRAW (*)    Specific Gravity, Urine 1.003 (*)    Hgb urine dipstick SMALL (*)    Bacteria, UA RARE (*)    All other components within normal limits  CBC  LIPASE, BLOOD  D-DIMER, QUANTITATIVE (NOT AT [facility])  I-STAT TROPONIN, ED  I-STAT BETA HCG BLOOD, ED ([facility], [facility], [facility] ONLY)  I-STAT TROPONIN, ED    EKG EKG Interpretation  Date/Time:  Saturday May 23 2017 23:46:22 EDT Ventricular Rate:  98 PR  Interval:    QRS Duration: 82 QT Interval:  398 QTC Calculation: 509 R Axis:   34 Text Interpretation:  Sinus rhythm Borderline T abnormalities, anterior leads Borderline prolonged QT interval no significant change  compared to earlier in the day Confirmed by Pricilla Loveless [phone]) on 05/23/2017 11:54:54 PM   Radiology Dg Chest 2 View  Result Date: 05/23/2017 CLINICAL DATA:  Chest pain, right back pain EXAM: CHEST - 2 VIEW COMPARISON:  04/15/2017 FINDINGS: Heart and mediastinal contours are within normal limits. No focal opacities or effusions. No acute bony abnormality. IMPRESSION: No active cardiopulmonary disease. Electronically Signed   By: Charlett Nose M.D.   On: 05/23/2017 17:12   Ct Renal Stone Study  Result Date: 05/23/2017 CLINICAL DATA:  Acute onset of right flank pain and left posterior chest pain. EXAM: CT ABDOMEN AND PELVIS WITHOUT CONTRAST TECHNIQUE: Multidetector CT imaging of the abdomen and pelvis was performed following the standard protocol without IV contrast. COMPARISON:  CT of the abdomen and pelvis performed 01/02/2017 FINDINGS: Lower chest: Minimal bibasilar atelectasis is noted. The visualized portions of the mediastinum are unremarkable. Hepatobiliary: The liver is unremarkable in appearance. The gallbladder is unremarkable in appearance. The common bile duct remains normal in caliber. Pancreas: The pancreas is within normal limits. Spleen: The spleen is unremarkable in appearance. Adrenals/Urinary Tract: The adrenal glands are unremarkable in appearance. Nonspecific perinephric stranding is noted bilaterally. There is no evidence of hydronephrosis. No renal or ureteral stones are identified. Stomach/Bowel: The stomach is unremarkable in appearance. The small bowel is within normal limits. The appendix is normal in caliber, without evidence of appendicitis. The colon is unremarkable in appearance. Vascular/Lymphatic: Scattered calcification is seen along the abdominal aorta  and its branches. The abdominal aorta is otherwise grossly unremarkable. The inferior vena cava is grossly unremarkable. No retroperitoneal lymphadenopathy is seen. No pelvic sidewall lymphadenopathy is identified. Reproductive: The bladder is significantly distended and within normal limits. The uterus is grossly unremarkable in appearance. The ovaries are relatively symmetric. No suspicious adnexal masses are seen. Other: No additional soft tissue abnormalities are seen. Musculoskeletal: No acute osseous abnormalities are identified. The visualized musculature is unremarkable in appearance. IMPRESSION: No acute abnormality seen within the abdomen or pelvis. Aortic Atherosclerosis (ICD10-I70.0). Electronically Signed   By: Roanna Raider M.D.   On: 05/23/2017 22:30    Procedures Procedures (including critical care time)  Medications Ordered in ED Medications  morphine 4 MG/ML injection 8 mg (8 mg Intravenous Given 05/23/17 2041)  ketorolac (TORADOL) 15 MG/ML injection 15 mg (15 mg Intravenous Given 05/24/17 0041)     Initial Impression / Assessment and Plan / ED Course  I have reviewed the triage vital signs and the nursing notes.  Pertinent labs & imaging results that were available during my care of the patient were reviewed by me and considered in my medical decision making (see chart for details).   On reevaluation the patient she states that her chest pain has improved.  She states that her right flank pain is what is currently bothering her.  She is also complaining of abdominal pain.  Repeat abdominal exam is benign.  No guarding noted and no significant tenderness on exam.  No rigidity noted.   Final Clinical Impressions(s) / ED Diagnoses   Final diagnoses:  Atypical chest pain  Flank pain    Patient presenting with left-sided chest pain, right flank pain, and abdominal pain.  She is initially tachycardic with elevated blood pressure.  Otherwise her vital signs are stable.  Lab  work, chest x-ray and EKG were ordered as well as d-dimer to rule out PE.  Chest x-ray negative for active cardiopulmonary disease.  No evidence of pneumothorax or widened mediastinum.  Normal mediastinum.  Initial ECG showed sinus tachycardia heart rate 106.  No acute ST-T wave changes.  Rate increased from last month. Repeat ECG shows normal sinus rhythm at 98 bpm.  Borderline T wave abnormalities with borderline QT interval prolongation as well.  No significant change when compared to prior today.  Tachycardia improved.  Delta troponins negative.  D-dimer was negative so doubt PE.  Doubt dissection.  CT renal study was obtained given flank pain and no stone was identified.  No evidence of pyelonephritis or other abnormality.  She had aortic atherosclerosis but otherwise the aorta did not show any evidence of dissection.  Normal appendix panel normal. Cbc without leukocytosis or anemia. BMP with normal creatinine and electrolytes. AST elevated to 72, normal ALT (appears chronic). Slight elevation in total bilirubin however do not feel that this is contributing to sxs. Lipase is negative. UA without evidence of UTI.   Patient's sxs improved following administration of morphine and Toradol in the department. Do not feel that patient's symptoms are consistent with ACS.  Doubt PE and doubt AAA.  Do not suspect any other acute cardiopulmonary other etiology at this time. HEART score is 3 based on EKG with nonspecific twave abnormality, age, and 1-2 risk factors including obesity and HTN. Feel pt is low risk for ACS and safe for outpt f/u. Do not suspect any other intraabdominal or pelvic etiology at this time given reassuring labwork and normal CT scan.  Advised patient to take Tylenol and ibuprofen for her symptoms, follow-up with her primary care provider for reevaluation in 3 days and to return to the ER for any new or worsening symptoms in the meantime.  She agrees to do so.  All questions were answered and  patient voiced understanding of the plan and expressed understanding agrees to return immediately to the emergency department.  ED Discharge Orders        Ordered    acetaminophen (TYLENOL) 325 MG tablet  Every 6 hours PRN     05/24/17 0030    ibuprofen (ADVIL,MOTRIN) 400 MG tablet  Every 6 hours PRN     05/24/17 0030       Carnelia Oscar S, PA-C 05/24/17 0145    Syra Sirmons S, PA-C 05/24/17 0148    Pricilla Loveless, MD 05/24/17 1524

## 2017-05-23 NOTE — ED Triage Notes (Signed)
Pt reports left sided chest pains that has been intermittent over past couple days and has been constant over the past 4 hours. Pain radiates to left arm, back and jaw. Pt reports that her heart will feel like it skips a beat. HX HTN

## 2017-05-24 MED ORDER — ACETAMINOPHEN 325 MG PO TABS: 650.0000 mg | ORAL_TABLET | Freq: Four times a day (QID) | ORAL | 0 refills | Status: DC | PRN

## 2017-05-24 MED ORDER — IBUPROFEN 400 MG PO TABS: 400.0000 mg | ORAL_TABLET | Freq: Four times a day (QID) | ORAL | 0 refills | Status: DC | PRN

## 2017-05-24 NOTE — Discharge Instructions (Addendum)
You may alternate taking Tylenol and Ibuprofen as needed for pain control. You may take 400-600 mg of ibuprofen every 6 hours and [phone] mg of Tylenol every 6 hours. Do not exceed 4000 mg of Tylenol daily as this can lead to liver damage. Also, make sure to take Ibuprofen with meals as it can cause an upset stomach. Do not take other NSAIDs while taking Ibuprofen such as (Aleve, Naprosyn, Aspirin, Celebrex, etc) and do not take more than the prescribed dose as this can lead to ulcers and bleeding in your GI tract.   Please follow up with your primary doctor within the next 3 days. You need to follow up with your PCP regarding your elevated blood pressure. If you do not have a primary care provider, information for a healthcare clinic has been provided for you to make arrangements for follow up care. Please return to the ER sooner if you have any new or worsening symptoms, or if you have any of the following symptoms:  Chest pain, shortness of breath Abdominal pain that does not go away.  You have a fever.  You keep throwing up (vomiting).  The pain is felt only in portions of the abdomen. Pain in the right side could possibly be appendicitis. In an adult, pain in the left lower portion of the abdomen could be colitis or diverticulitis.  You pass bloody or black tarry stools.  There is bright red blood in the stool.  The constipation stays for more than 4 days.  There is belly (abdominal) or rectal pain.  You do not seem to be getting better.  You have any questions or concerns.

## 2017-05-25 ENCOUNTER — Ambulatory Visit: Payer: Self-pay

## 2017-05-25 ENCOUNTER — Encounter: Payer: Self-pay | Admitting: Family Medicine

## 2017-05-25 ENCOUNTER — Ambulatory Visit: Payer: 59 | Admitting: Family Medicine

## 2017-05-25 VITALS — BP 156/94 | HR 98 | Temp 98.2°F | Ht 65.0 in | Wt 157.2 lb

## 2017-05-25 DIAGNOSIS — R002 Palpitations: Secondary | ICD-10-CM | POA: Insufficient documentation

## 2017-05-25 DIAGNOSIS — R748 Abnormal levels of other serum enzymes: Secondary | ICD-10-CM | POA: Insufficient documentation

## 2017-05-25 DIAGNOSIS — K219 Gastro-esophageal reflux disease without esophagitis: Secondary | ICD-10-CM

## 2017-05-25 DIAGNOSIS — I10 Essential (primary) hypertension: Secondary | ICD-10-CM | POA: Diagnosis not present

## 2017-05-25 DIAGNOSIS — R0789 Other chest pain: Secondary | ICD-10-CM

## 2017-05-25 DIAGNOSIS — R740 Nonspecific elevation of levels of transaminase and lactic acid dehydrogenase [LDH]: Secondary | ICD-10-CM

## 2017-05-25 DIAGNOSIS — R7401 Elevation of levels of liver transaminase levels: Secondary | ICD-10-CM

## 2017-05-25 DIAGNOSIS — F430 Acute stress reaction: Secondary | ICD-10-CM | POA: Diagnosis not present

## 2017-05-25 MED ORDER — CYCLOBENZAPRINE HCL 10 MG PO TABS: 10.0000 mg | ORAL_TABLET | Freq: Three times a day (TID) | ORAL | 1 refills | Status: DC | PRN

## 2017-05-25 NOTE — Progress Notes (Signed)
Subjective:    Patient ID: Nicole Hughes, female    DOB: 07-May-1970, 47 y.o.   MRN: [id number]  HPI Here for f/u of ED visit for chest pain /sob/back pain   5/18- ED  Pleuritic cp Some R flank pain and back pain   Neg DDimer Not hypoxic  2 neg troponins  CT stone study was negative    EKG Text Interpretation:       Sinus rhythm Borderline T abnormalities, anterior leads Borderline prolonged QT interval no significant change compared to earlier in the day Confirmed by Pricilla Loveless [phone]) on 05/23/2017 11:54:54 PM Also confirmed by Pricilla Loveless [phone]), editor Barbette Hair [phone])  on 05/24/2017 8:32:18 AM  Results for orders placed or performed during the hospital encounter of 05/23/17  Basic metabolic panel  Result Value Ref Range   Sodium 138 135 - 145 mmol/L   Potassium 3.4 (L) 3.5 - 5.1 mmol/L   Chloride 105 101 - 111 mmol/L   CO2 21 (L) 22 - 32 mmol/L   Glucose, Bld 92 65 - 99 mg/dL   BUN 10 6 - 20 mg/dL   Creatinine, Ser 2.13 0.44 - 1.00 mg/dL   Calcium 8.8 (L) 8.9 - 10.3 mg/dL   GFR calc non Af Amer >60 >60 mL/min   GFR calc Af Amer >60 >60 mL/min   Anion gap 12 5 - 15  CBC  Result Value Ref Range   WBC 7.9 4.0 - 10.5 K/uL   RBC 4.16 3.87 - 5.11 MIL/uL   Hemoglobin 12.2 12.0 - 15.0 g/dL   HCT 08.6 57.8 - 46.9 %   MCV 88.7 78.0 - 100.0 fL   MCH 29.3 26.0 - 34.0 pg   MCHC 33.1 30.0 - 36.0 g/dL   RDW 62.9 52.8 - 41.3 %   Platelets 217 150 - 400 K/uL  Hepatic function panel  Result Value Ref Range   Total Protein 7.7 6.5 - 8.1 g/dL   Albumin 4.7 3.5 - 5.0 g/dL   AST 72 (H) 15 - 41 U/L   ALT 26 14 - 54 U/L   Alkaline Phosphatase 41 38 - 126 U/L   Total Bilirubin 1.4 (H) 0.3 - 1.2 mg/dL   Bilirubin, Direct 0.6 (H) 0.1 - 0.5 mg/dL   Indirect Bilirubin 0.8 0.3 - 0.9 mg/dL  Lipase, blood  Result Value Ref Range   Lipase 32 11 - 51 U/L  D-dimer, quantitative  Result Value Ref Range   D-Dimer, Quant <0.27 0.00 - 0.50 ug/mL-FEU  Urinalysis, Routine w  reflex microscopic  Result Value Ref Range   Color, Urine STRAW (A) YELLOW   APPearance CLEAR CLEAR   Specific Gravity, Urine 1.003 (L) 1.005 - 1.030   pH 6.0 5.0 - 8.0   Glucose, UA NEGATIVE NEGATIVE mg/dL   Hgb urine dipstick SMALL (A) NEGATIVE   Bilirubin Urine NEGATIVE NEGATIVE   Ketones, ur NEGATIVE NEGATIVE mg/dL   Protein, ur NEGATIVE NEGATIVE mg/dL   Nitrite NEGATIVE NEGATIVE   Leukocytes, UA NEGATIVE NEGATIVE   RBC / HPF 0-5 0 - 5 RBC/hpf   WBC, UA 0-5 0 - 5 WBC/hpf   Bacteria, UA RARE (A) NONE SEEN   Squamous Epithelial / LPF 0-5 0 - 5  I-stat troponin, ED  Result Value Ref Range   Troponin i, poc 0.00 0.00 - 0.08 ng/mL   Comment 3          I-Stat beta hCG blood, ED  Result Value Ref Range  I-stat hCG, quantitative <5.0 <5 mIU/mL   Comment 3          I-Stat Troponin, ED (not at [facility])  Result Value Ref Range   Troponin i, poc 0.02 0.00 - 0.08 ng/mL   Comment 3             Dg Chest 2 View  Result Date: 05/23/2017 CLINICAL DATA:  Chest pain, right back pain EXAM: CHEST - 2 VIEW COMPARISON:  04/15/2017 FINDINGS: Heart and mediastinal contours are within normal limits. No focal opacities or effusions. No acute bony abnormality. IMPRESSION: No active cardiopulmonary disease. Electronically Signed   By: Charlett Nose M.D.   On: 05/23/2017 17:12   Ct Renal Stone Study  Result Date: 05/23/2017 CLINICAL DATA:  Acute onset of right flank pain and left posterior chest pain. EXAM: CT ABDOMEN AND PELVIS WITHOUT CONTRAST TECHNIQUE: Multidetector CT imaging of the abdomen and pelvis was performed following the standard protocol without IV contrast. COMPARISON:  CT of the abdomen and pelvis performed 01/02/2017 FINDINGS: Lower chest: Minimal bibasilar atelectasis is noted. The visualized portions of the mediastinum are unremarkable. Hepatobiliary: The liver is unremarkable in appearance. The gallbladder is unremarkable in appearance. The common bile duct remains normal in caliber.  Pancreas: The pancreas is within normal limits. Spleen: The spleen is unremarkable in appearance. Adrenals/Urinary Tract: The adrenal glands are unremarkable in appearance. Nonspecific perinephric stranding is noted bilaterally. There is no evidence of hydronephrosis. No renal or ureteral stones are identified. Stomach/Bowel: The stomach is unremarkable in appearance. The small bowel is within normal limits. The appendix is normal in caliber, without evidence of appendicitis. The colon is unremarkable in appearance. Vascular/Lymphatic: Scattered calcification is seen along the abdominal aorta and its branches. The abdominal aorta is otherwise grossly unremarkable. The inferior vena cava is grossly unremarkable. No retroperitoneal lymphadenopathy is seen. No pelvic sidewall lymphadenopathy is identified. Reproductive: The bladder is significantly distended and within normal limits. The uterus is grossly unremarkable in appearance. The ovaries are relatively symmetric. No suspicious adnexal masses are seen. Other: No additional soft tissue abnormalities are seen. Musculoskeletal: No acute osseous abnormalities are identified. The visualized musculature is unremarkable in appearance. IMPRESSION: No acute abnormality seen within the abdomen or pelvis. Aortic Atherosclerosis (ICD10-I70.0). Electronically Signed   By: Roanna Raider M.D.   On: 05/23/2017 22:30    Still feels pressure in upper chest - radiates to her neck and head  Worse to take a deep breath  Also having palpitations   Last saw cardiolgy when she had a cardiomyop with her pregnancy    BP Readings from Last 3 Encounters:  05/25/17 (!) 156/94  05/24/17 (!) 178/99  04/15/17 130/90  taking her bp medicine every day  Elevated at home ? Due to pain     Has not done anything differently  Pain in mid back will not stop  Drinking lots of water  Avoiding caffeine   ETOH status -- stopped alcohol completely    GERD status   Mood - has  been better  [facility]  Could not see psychiatry - more financial issues - paying for kids / no car   Lab Results  Component Value Date   TSH 0.54 04/09/2015    Patient Active Problem List   Diagnosis Date Noted  . Palpitations 05/25/2017  . Chest wall pain 05/25/2017  . Elevated transaminase level 05/25/2017  . Depression with anxiety 02/23/2017  . 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 Hyponatremia 02/17/2017  .  Hypokalemia 09/24/2016  . H/O alcohol abuse 09/22/2016  . H/O acute pancreatitis 09/22/2016  . GERD (gastroesophageal reflux disease) 04/09/2015  . 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 Chest pain 08/20/2012  . Stress reaction 07/23/2012  . Essential hypertension 06/09/2012  . History of pulmonary embolism 06/09/2012  . Preop cardiovascular exam 06/08/2012  . Dysmenorrhea   . Dyspareunia   . Pelvic pain    Past Medical History:  Diagnosis Date  . Anxiety   . Cardiomyopathy (HCC)   . Chronic lower back pain   . Complication of anesthesia    "hard to get under"  . Cyst of ovary   . Depression    hx  . DVT complicating pregnancy 08/1998   RLE; RUE  . Dysmenorrhea   . Dyspareunia   . 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 GERD (gastroesophageal reflux disease)   . Hay fever    "fall and spring" (02/17/2017)  . Headache    "2-3 times/.wik" (02/17/2017)  . History of chicken pox   . Hypertension   . Migraine    "monthly" (02/17/2017)  . Pelvic pain   . Pulmonary embolism (HCC) 08/1998   S/P childbirth   Past Surgical History:  Procedure Laterality Date  . CESAREAN SECTION  1994  . ECTOPIC PREGNANCY SURGERY  2010  . LAPAROSCOPY N/A 09/24/2012   Procedure: OPERATIVE LAPROSCOPY WITH LYSIS OF ADHESIONS;  Surgeon: Sherron Monday, MD;  Location: [facility];  Service: Gynecology;  Laterality: N/A;  . TONSILLECTOMY  1984  . TUBAL LIGATION  2010   Social History   Tobacco Use  . Smoking status: Never Smoker  . Smokeless tobacco:  Never Used  Substance Use Topics  . Alcohol use: Not Currently    Alcohol/week: 3.0 oz    Types: 5 Cans of beer per week    Frequency: Never    Comment: has quit (March 2019)  . Drug use: No   Family History  Problem Relation Age of Onset  . Cancer - Lung Mother   . Heart failure Father   . Hypertension Father    No Known Allergies Current Outpatient Medications on File Prior to Visit  Medication Sig Dispense Refill  . amLODipine (NORVASC) 5 MG tablet Take 1 tablet (5 mg total) by mouth daily. 30 tablet 5  . lisinopril (PRINIVIL,ZESTRIL) 10 MG tablet Take 1 tablet (10 mg total) by mouth daily. 30 tablet 5  . Multiple Vitamin (MULTIVITAMIN WITH MINERALS) TABS tablet Take 1 tablet by mouth daily. 30 tablet 0  . omeprazole (PRILOSEC) 20 MG capsule Take 1 capsule (20 mg total) by mouth daily. 30 capsule 5  . acetaminophen (TYLENOL) 325 MG tablet Take 2 tablets (650 mg total) by mouth every 6 (six) hours as needed. Do not take more than  of tylenol per day (Patient not taking: Reported on 05/25/2017) 30 tablet 0  . ibuprofen (ADVIL,MOTRIN) 400 MG tablet Take 1 tablet (400 mg total) by mouth every 6 (six) hours as needed. (Patient not taking: Reported on 05/25/2017) 30 tablet 0   No current facility-administered medications on file prior to visit.     Review of Systems  Constitutional: Negative for activity change, appetite change, fatigue, fever and unexpected weight change.  HENT: Negative for congestion, ear pain, rhinorrhea, sinus pressure and sore throat.   Eyes: Negative for pain, redness and visual disturbance.  Respiratory: Positive for chest tightness. Negative for cough, shortness of breath, wheezing and stridor.        Pain  with inspiration  Cardiovascular: Positive for chest pain. Negative for palpitations and leg swelling.  Gastrointestinal: Negative for abdominal pain, blood in stool, constipation and diarrhea.  Endocrine: Negative for polydipsia and polyuria.    Genitourinary: Positive for pelvic pain. Negative for dysuria, frequency and urgency.  Musculoskeletal: Positive for back pain. Negative for arthralgias and myalgias.  Skin: Negative for pallor and rash.  Allergic/Immunologic: Negative for environmental allergies.  Neurological: Negative for dizziness, syncope and headaches.  Hematological: Negative for adenopathy. Does not bruise/bleed easily.  Psychiatric/Behavioral: Negative for decreased concentration, dysphoric mood and suicidal ideas. The patient is nervous/anxious.        Objective:   Physical Exam  Constitutional: She appears well-developed and well-nourished. No distress.  Well appearing- uncomfortable but not distressed   HENT:  Head: Normocephalic and atraumatic.  Mouth/Throat: Oropharynx is clear and moist.  Eyes: Pupils are equal, round, and reactive to light. Conjunctivae and EOM are normal.  Neck: Normal range of motion. Neck supple. No JVD present. Carotid bruit is not present. No thyromegaly present.  Cardiovascular: Regular rhythm, normal heart sounds and intact distal pulses.  No extrasystoles are present. Exam reveals no gallop and no decreased pulses.  No murmur heard. HR low 90s on exam    Pulmonary/Chest: Effort normal and breath sounds normal. No accessory muscle usage. No apnea, no tachypnea and no bradypnea. No respiratory distress. She has no wheezes. She has no rales.  No crackles  Good air exch No rales or rhonchi  Abdominal: Soft. Bowel sounds are normal. She exhibits no distension, no abdominal bruit and no mass. There is no tenderness.  abd is nt R lower ribs are tender  Musculoskeletal: She exhibits no edema.       Right lower leg: Normal. She exhibits no tenderness and no edema.       Left lower leg: Normal. She exhibits no tenderness and no edema.  Chest wall tenderness- worst on R over lower ant ribs No crepitus or skin change   Baseline scarring on low back   No spinal tenderness Cervical  musculature is tight and tender -including trapezius   Lymphadenopathy:    She has no cervical adenopathy.  Neurological: She is alert. She has normal reflexes.  Skin: Skin is warm and dry. No rash noted. No cyanosis or erythema. No pallor.  Psychiatric: Her mood appears anxious. She does not express inappropriate judgment.  Mildly anxoius           Assessment & Plan:   Problem List Items Addressed This Visit      Cardiovascular and Mediastinum   Essential hypertension    bp is up but pt is also anxious and in pain  Will plan f/u for bp visit in 4-6 weeks  Urged to check at home when relaxed and feeling better BP: (!) 156/94            Digestive   GERD (gastroesophageal reflux disease)    Per pt well controlled currently        Other   Chest wall pain - Primary    Pleuritic-worst on R side and rad up to neck Reviewed hospital records, lab results and studies in detail   Reassuring ekg/xr/ct and labs  Tender on exam  tx with ibuprofen prn with food  Warm compress  Flexeril tid prn with caution of sedation  Update if not starting to improve in a week or if worsening        Elevated transaminase level  Suspect due to past etoh abuse Re check today        Relevant Orders   Hepatic function panel   Palpitations    With chest wall discomfort s/p ED visit Reviewed hospital records, lab results and studies in detail  Labs/EKG and CT are re assuring  Disc anxiety/stress/pain  Will tx cwp with flexeril  She will wean off caffeine  TSH today  If nl and no imp -ref to cardiology      Relevant Orders   TSH   Stress reaction    With anxiety and past hx of etoh abuse Pt could not afford copay for psychiatrist  Overall doing better  Continues counseling with pastor  Reviewed stressors/ coping techniques/symptoms/ support sources/ tx options and side effects in detail today

## 2017-05-25 NOTE — Telephone Encounter (Signed)
Patient called in with c/o "chest pain." She says "I was in the ED on Saturday with chest pain and was sent home because they said it was nothing heart related going on. But the pain has remained the whole weekend. I feel like something is on my chest, it's hard to get good breaths. The pain is a 7 on the left side going up my neck into my head and down my back sometimes. I've had nausea off and on, a little sweating." According to protocol, go to ED, she says "I really don't have the money to go back to the ED. What if I go and they say the same thing? Something is going on and they are not catching it. I feel horrible." I asked the patient to hold the line while I call the office to see if she can be worked into the schedule today. I called the office and spoke to Anderson, [facility], she was informed of the above with the patient, she says if the patient is not too critical, she can be seen today at 1615 by Dr. Milinda Antis, I advised the patient said she would have to make arrangements before just going to the ED. I advised the patient of the appointment, she says "I will be there." Care advice given, patient verbalized understanding.  Reason for Disposition . Chest pain lasts > 5 minutes (Exceptions: chest pain occurring > 3 days ago and now asymptomatic; same as previously diagnosed heartburn and has accompanying sour taste in mouth)  Answer Assessment - Initial Assessment Questions 1. LOCATION: "Where does it hurt?"       Left side of chest 2. RADIATION: "Does the pain go anywhere else?" (e.g., into neck, jaw, arms, back)     Neck, head, back 3. ONSET: "When did the chest pain begin?" (Minutes, hours or days)      Last week 4. PATTERN "Does the pain come and go, or has it been constant since it started?"  "Does it get worse with exertion?"      Constant since Saturday 5. DURATION: "How long does it last" (e.g., seconds, minutes, hours)     Constant 6. SEVERITY: "How bad is the pain?"  (e.g., Scale 1-10; mild,  moderate, or severe)    - MILD (1-3): doesn't interfere with normal activities     - MODERATE (4-7): interferes with normal activities or awakens from sleep    - SEVERE (8-10): excruciating pain, unable to do any normal activities       7 7. CARDIAC RISK FACTORS: "Do you have any history of heart problems or risk factors for heart disease?" (e.g., prior heart attack, angina; high blood pressure, diabetes, being overweight, high cholesterol, smoking, or strong family history of heart disease)     HTN 8. PULMONARY RISK FACTORS: "Do you have any history of lung disease?"  (e.g., blood clots in lung, asthma, emphysema, birth control pills)     Mother lung cancer 9. CAUSE: "What do you think is causing the chest pain?"     I don't know 10. OTHER SYMPTOMS: "Do you have any other symptoms?" (e.g., dizziness, nausea, vomiting, sweating, fever, difficulty breathing, cough)       Can't get a good breath, a little sweating, nausea off and on 11. PREGNANCY: "Is there any chance you are pregnant?" "When was your last menstrual period?"       No  Protocols used: CHEST PAIN-A-AH

## 2017-05-25 NOTE — Assessment & Plan Note (Signed)
Pleuritic-worst on R side and rad up to neck Reviewed hospital records, lab results and studies in detail   Reassuring ekg/xr/ct and labs  Tender on exam  tx with ibuprofen prn with food  Warm compress  Flexeril tid prn with caution of sedation  Update if not starting to improve in a week or if worsening

## 2017-05-25 NOTE — Assessment & Plan Note (Signed)
Suspect due to past etoh abuse Re check today

## 2017-05-25 NOTE — Assessment & Plan Note (Signed)
With anxiety and past hx of etoh abuse Pt could not afford copay for psychiatrist  Overall doing better  Continues counseling with pastor  Reviewed stressors/ coping techniques/symptoms/ support sources/ tx options and side effects in detail today

## 2017-05-25 NOTE — Assessment & Plan Note (Signed)
With chest wall discomfort s/p ED visit Reviewed hospital records, lab results and studies in detail  Labs/EKG and CT are re assuring  Disc anxiety/stress/pain  Will tx cwp with flexeril  She will wean off caffeine  TSH today  If nl and no imp -ref to cardiology

## 2017-05-25 NOTE — Patient Instructions (Addendum)
Drink lots of fluids   Tylenol- use sparingly since it is metabolized by the liver  Ibuprofen- take with a full stomach  Continue to avoid alcohol   Take flexeril as needed for chest wall pain as well (careful of sedation)  Do some stretching when you are ready   Today- labs to re check liver and also for thyroid check   If worse palpitations- we will refer you to cardiology    Follow up in 4-6 weeks for blood pressure

## 2017-05-25 NOTE — Telephone Encounter (Signed)
Pt has appt with Dr Milinda Antis on 05/25/17 at 4:15.

## 2017-05-25 NOTE — Assessment & Plan Note (Signed)
bp is up but pt is also anxious and in pain  Will plan f/u for bp visit in 4-6 weeks  Urged to check at home when relaxed and feeling better BP: (!) 156/94

## 2017-05-25 NOTE — Assessment & Plan Note (Signed)
Per pt well controlled currently

## 2017-05-26 LAB — HEPATIC FUNCTION PANEL
ALT: 33 U/L (ref 0–35)
AST: 46 U/L — ABNORMAL HIGH (ref 0–37)
Albumin: 4.5 g/dL (ref 3.5–5.2)
Alkaline Phosphatase: 39 U/L (ref 39–117)
Bilirubin, Direct: 0.1 mg/dL (ref 0.0–0.3)
Total Bilirubin: 0.4 mg/dL (ref 0.2–1.2)
Total Protein: 7.5 g/dL (ref 6.0–8.3)

## 2017-05-26 LAB — TSH: TSH: 0.95 u[IU]/mL (ref 0.35–4.50)

## 2017-05-27 ENCOUNTER — Telehealth: Payer: Self-pay | Admitting: Family Medicine

## 2017-05-27 NOTE — Telephone Encounter (Signed)
During call to give lab results pt wanted to let Dr Milinda Antis know that she is still having neck pain and is asking if there is any other medicine that can help. OV 05/25/17

## 2017-05-28 NOTE — Telephone Encounter (Signed)
I think she is taking ibuprofen (how much mg wise is she taking) ? We may be able to subs a different stronger nsaid  Is she taking the flexeril?

## 2017-05-29 NOTE — Telephone Encounter (Signed)
Left VM requesting pt to call the office back CRM created 

## 2017-06-08 ENCOUNTER — Ambulatory Visit: Payer: Self-pay

## 2017-06-08 DIAGNOSIS — I10 Essential (primary) hypertension: Secondary | ICD-10-CM

## 2017-06-08 DIAGNOSIS — R002 Palpitations: Secondary | ICD-10-CM

## 2017-06-08 DIAGNOSIS — R079 Chest pain, unspecified: Secondary | ICD-10-CM

## 2017-06-08 NOTE — Telephone Encounter (Signed)
I would like to refer her to cardiology since she is not totally better  Please ask if agreeable

## 2017-06-08 NOTE — Telephone Encounter (Signed)
Pt last seen 05/2017

## 2017-06-08 NOTE — Telephone Encounter (Signed)
Pt. Reports she is still having left sided chest pain. All other "pain is gone." "I'm better except for the pain above her left breast" Pain is worse when she moves around or bends over. Denies any shortness of breath. Reports "muscle relaxer's the doctor gave me helped for a couple days." Wants to know what Dr. Milinda Antisower wants her to do.  Answer Assessment - Initial Assessment Questions 1. LOCATION: "Where does it hurt?"       Left side above the breast 2. RADIATION: "Does the pain go anywhere else?" (e.g., into neck, jaw, arms, back)     Goes ino her back 3. ONSET: "When did the chest pain begin?" (Minutes, hours or days)      Started in May 4. PATTERN "Does the pain come and go, or has it been constant since it started?"  "Does it get worse with exertion?"      Constant right now 5. DURATION: "How long does it last" (e.g., seconds, minutes, hours)     Worse with movement 6. SEVERITY: "How bad is the pain?"  (e.g., Scale 1-10; mild, moderate, or severe)    - MILD (1-3): doesn't interfere with normal activities     - MODERATE (4-7): interferes with normal activities or awakens from sleep    - SEVERE (8-10): excruciating pain, unable to do any normal activities       8 7. CARDIAC RISK FACTORS: "Do you have any history of heart problems or risk factors for heart disease?" (e.g., prior heart attack, angina; high blood pressure, diabetes, being overweight, high cholesterol, smoking, or strong family history of heart disease)     HTN 8. PULMONARY RISK FACTORS: "Do you have any history of lung disease?"  (e.g., blood clots in lung, asthma, emphysema, birth control pills)     PE in 2000 9. CAUSE: "What do you think is causing the chest pain?"     Unsure 10. OTHER SYMPTOMS: "Do you have any other symptoms?" (e.g., dizziness, nausea, vomiting, sweating, fever, difficulty breathing, cough)       Waves of nausea 11. PREGNANCY: "Is there any chance you are pregnant?" "When was your last menstrual  period?"       No  Protocols used: CHEST PAIN-A-AH

## 2017-06-09 ENCOUNTER — Encounter: Payer: Self-pay | Admitting: Family Medicine

## 2017-06-09 NOTE — Addendum Note (Signed)
Addended by: Roxy MannsWER, Odalis Jordan A on: 06/09/2017 12:31 PM   Modules accepted: Orders

## 2017-06-09 NOTE — Telephone Encounter (Signed)
Ref done Will send to [facility] Thanks  

## 2017-06-09 NOTE — Telephone Encounter (Signed)
Pt agrees with referral to cardiologist, please put referral in and I [facility]

## 2017-06-09 NOTE — Telephone Encounter (Signed)
Addressed through another phone note 

## 2017-06-10 NOTE — Telephone Encounter (Signed)
Cardiology Appt made and letter sent to patient because I have not been able to contact patient to give her the appointment information.

## 2017-06-17 ENCOUNTER — Telehealth: Payer: Self-pay

## 2017-06-17 ENCOUNTER — Other Ambulatory Visit: Payer: Self-pay | Admitting: Family Medicine

## 2017-06-17 MED ORDER — CYCLOBENZAPRINE HCL 10 MG PO TABS: 10.0000 mg | ORAL_TABLET | Freq: Three times a day (TID) | ORAL | 1 refills | Status: DC | PRN

## 2017-06-17 NOTE — Addendum Note (Signed)
Addended by: Roxy MannsWER, Novi Calia A on: 06/17/2017 03:39 PM   Modules accepted: Orders

## 2017-06-17 NOTE — Telephone Encounter (Signed)
Last OV was 05/25/17 and last filled on 05/25/17 #30 tabs with 1 refills, please also see pharmacies note on Rx requesting 90 day supply

## 2017-06-17 NOTE — Telephone Encounter (Signed)
I refilled it earlier today. 

## 2017-06-17 NOTE — Telephone Encounter (Signed)
I refilled it

## 2017-06-17 NOTE — Telephone Encounter (Signed)
Lm on pts vm and advised she should have refills remaining. Rx sent 05/25/17 #30 1R

## 2017-06-17 NOTE — Telephone Encounter (Signed)
Pt. Requesting a refill on her Flexeril. States "it really helps my chest pain. If I could just have enough until my appointment with cardiology this coming Monday."  [facility] [address]

## 2017-06-17 NOTE — Telephone Encounter (Signed)
Please see refill note in pts chart if you want to refill flexeril. Thank you.

## 2017-06-17 NOTE — Telephone Encounter (Signed)
Pt calling to check status on her flexeril being refilled. She states she has already used the 1 refill that was sent in on 05/25/17.

## 2017-06-22 ENCOUNTER — Encounter (INDEPENDENT_AMBULATORY_CARE_PROVIDER_SITE_OTHER): Payer: Self-pay

## 2017-06-22 ENCOUNTER — Encounter: Payer: Self-pay | Admitting: Interventional Cardiology

## 2017-06-22 ENCOUNTER — Ambulatory Visit: Payer: 59 | Admitting: Interventional Cardiology

## 2017-06-22 ENCOUNTER — Other Ambulatory Visit: Payer: Self-pay

## 2017-06-22 VITALS — BP 176/96 | HR 115 | Ht 65.0 in | Wt 158.8 lb

## 2017-06-22 DIAGNOSIS — R079 Chest pain, unspecified: Secondary | ICD-10-CM

## 2017-06-22 DIAGNOSIS — K219 Gastro-esophageal reflux disease without esophagitis: Secondary | ICD-10-CM

## 2017-06-22 DIAGNOSIS — R0602 Shortness of breath: Secondary | ICD-10-CM

## 2017-06-22 DIAGNOSIS — Z87898 Personal history of other specified conditions: Secondary | ICD-10-CM | POA: Diagnosis not present

## 2017-06-22 DIAGNOSIS — R000 Tachycardia, unspecified: Secondary | ICD-10-CM | POA: Diagnosis not present

## 2017-06-22 DIAGNOSIS — I10 Essential (primary) hypertension: Secondary | ICD-10-CM

## 2017-06-22 DIAGNOSIS — F1011 Alcohol abuse, in remission: Secondary | ICD-10-CM

## 2017-06-22 LAB — CBC
Hematocrit: 37.6 % (ref 34.0–46.6)
Hemoglobin: 12.5 g/dL (ref 11.1–15.9)
MCH: 29.2 pg (ref 26.6–33.0)
MCHC: 33.2 g/dL (ref 31.5–35.7)
MCV: 88 fL (ref 79–97)
Platelets: 239 10*3/uL (ref 150–450)
RBC: 4.28 x10E6/uL (ref 3.77–5.28)
RDW: 15.5 % — ABNORMAL HIGH (ref 12.3–15.4)
WBC: 6.6 10*3/uL (ref 3.4–10.8)

## 2017-06-22 LAB — BASIC METABOLIC PANEL
BUN/Creatinine Ratio: 11 (ref 9–23)
BUN: 6 mg/dL (ref 6–24)
CO2: 20 mmol/L (ref 20–29)
Calcium: 9.5 mg/dL (ref 8.7–10.2)
Chloride: 101 mmol/L (ref 96–106)
Creatinine, Ser: 0.53 mg/dL — ABNORMAL LOW (ref 0.57–1.00)
GFR calc Af Amer: 133 mL/min/{1.73_m2} (ref >59)
GFR calc non Af Amer: 115 mL/min/{1.73_m2} (ref >59)
Glucose: 75 mg/dL (ref 65–99)
Potassium: 3.8 mmol/L (ref 3.5–5.2)
Sodium: 138 mmol/L (ref 134–144)

## 2017-06-22 LAB — PRO B NATRIURETIC PEPTIDE: NT-Pro BNP: 155 pg/mL (ref 0–249)

## 2017-06-22 LAB — TSH: TSH: 0.685 u[IU]/mL (ref 0.450–4.500)

## 2017-06-22 MED ORDER — METOPROLOL SUCCINATE ER 25 MG PO TB24: 25.0000 mg | ORAL_TABLET | Freq: Every day | ORAL | 3 refills | Status: DC

## 2017-06-22 MED ORDER — MITIGARE 0.6 MG PO CAPS: ORAL_CAPSULE | ORAL | 0 refills | Status: DC

## 2017-06-22 MED ORDER — COLCHICINE 0.6 MG PO TABS: 0.6000 mg | ORAL_TABLET | Freq: Two times a day (BID) | ORAL | 0 refills | Status: DC

## 2017-06-22 NOTE — Progress Notes (Signed)
Cardiology Office Note    Date:  06/22/2017   ID:  Nicole Hughes, DOB 1970/09/26, MRN [id number]  PCP:  Nicole Pimple, MD  Cardiologist: Lesleigh Noe, MD   Chief Complaint  Patient presents with  . Chest Pain  . Shortness of Breath    History of Present Illness:  Nicole Hughes is a 47 y.o. female referred by Dr. Vinnie Langton for evaluation of chest pain and shortness of breath.  There is a 2-week history of chest discomfort with radiation into the left neck that is been nearly continuous since May 23, 2017.  She describes the discomfort as severe and associated with nausea.  She was seen in the emergency room and felt not to be having cardiac discomfort.  The discomfort is continuously present, slightly worse with lying down, she was initially treated for costochondritis.  She is here today for further evaluation.  Prior history consistent with post partum cardiomyopathy/heart failure as well as PE in 2000.  No recurrence of cardiac dysfunction since that time.  Last echocardiogram in 2014 demonstrated normal LV function.    Past Medical History:  Diagnosis Date  . Anxiety   . Cardiomyopathy (HCC)   . Chronic lower back pain   . Complication of anesthesia    "hard to get under"  . Cyst of ovary   . Depression    hx  . DVT complicating pregnancy 08/1998   RLE; RUE  . Dysmenorrhea   . Dyspareunia   . 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 GERD (gastroesophageal reflux disease)   . Hay fever    "fall and spring" (02/17/2017)  . Headache    "2-3 times/.wik" (02/17/2017)  . History of chicken pox   . Hypertension   . Migraine    "monthly" (02/17/2017)  . Pelvic pain   . Pulmonary embolism (HCC) 08/1998   S/P childbirth    Past Surgical History:  Procedure Laterality Date  . CESAREAN SECTION  1994  . ECTOPIC PREGNANCY SURGERY  2010  . LAPAROSCOPY N/A 09/24/2012   Procedure: OPERATIVE LAPROSCOPY WITH LYSIS OF ADHESIONS;  Surgeon: Sherron Monday, MD;   Location: [facility];  Service: Gynecology;  Laterality: N/A;  . TONSILLECTOMY  1984  . TUBAL LIGATION  2010    Current Medications: Outpatient Medications Prior to Visit  Medication Sig Dispense Refill  . acetaminophen (TYLENOL) 325 MG tablet Take 2 tablets (650 mg total) by mouth every 6 (six) hours as needed. Do not take more than 4000mg  of tylenol per day 30 tablet 0  . amLODipine (NORVASC) 5 MG tablet Take 1 tablet (5 mg total) by mouth daily. 30 tablet 5  . cyclobenzaprine (FLEXERIL) 10 MG tablet Take 1 tablet (10 mg total) by mouth 3 (three) times daily as needed for muscle spasms. And chest wall pain 90 tablet 1  . ibuprofen (ADVIL,MOTRIN) 400 MG tablet Take 1 tablet (400 mg total) by mouth every 6 (six) hours as needed. 30 tablet 0  . lisinopril (PRINIVIL,ZESTRIL) 10 MG tablet Take 1 tablet (10 mg total) by mouth daily. 30 tablet 5  . Multiple Vitamin (MULTIVITAMIN WITH MINERALS) TABS tablet Take 1 tablet by mouth daily. 30 tablet 0  . omeprazole (PRILOSEC) 20 MG capsule Take 1 capsule (20 mg total) by mouth daily. 30 capsule 5   No facility-administered medications prior to visit.      Allergies:   Patient has no known allergies.   Social History   Socioeconomic History  .  Marital status: Married    Spouse name: Not on file  . Number of children: Not on file  . Years of education: Not on file  . Highest education level: Not on file  Occupational History  . Not on file  Social Needs  . Financial resource strain: Not on file  . Food insecurity:    Worry: Not on file    Inability: Not on file  . Transportation needs:    Medical: Not on file    Non-medical: Not on file  Tobacco Use  . Smoking status: Never Smoker  . Smokeless tobacco: Never Used  Substance and Sexual Activity  . Alcohol use: Not Currently    Alcohol/week: 3.0 oz    Types: 5 Cans of beer per week    Frequency: Never    Comment: has quit (March 2019)  . Drug use: No  . Sexual activity: Not Currently      Birth control/protection: Surgical  Lifestyle  . Physical activity:    Days per week: Not on file    Minutes per session: Not on file  . Stress: Not on file  Relationships  . Social connections:    Talks on phone: Not on file    Gets together: Not on file    Attends religious service: Not on file    Active member of club or organization: Not on file    Attends meetings of clubs or organizations: Not on file    Relationship status: Not on file  Other Topics Concern  . Not on file  Social History Narrative  . Not on file     Family History:  The patient's family history includes Cancer - Lung in her mother; Heart failure in her father; Hypertension in her father.   ROS:   Please see the history of present illness.    Prior heavy alcohol use.  Multiple complaints including change in appetite, excessive fatigue, racing/irregular heartbeat, shortness of breath, abdominal pain, back pain, headaches, and anxiety.  Also nausea and vomiting. All other systems reviewed and are negative.   PHYSICAL EXAM:   VS:  BP (!) 176/96   Pulse (!) 115   Ht 5\' 5"  (1.651 m)   Wt 158 lb 12.8 oz (72 kg)   SpO2 99%   BMI 26.43 kg/m    GEN: Well nourished, well developed, in no acute distress  HEENT: normal  Neck: no JVD, carotid bruits, or masses Cardiac: RRR; no murmurs, rubs, or gallops,no edema  Respiratory:  clear to auscultation bilaterally, normal work of breathing GI: soft, nontender, nondistended, + BS MS: no deformity or atrophy  Skin: warm and dry, no rash Neuro:  Alert and Oriented x 3, Strength and sensation are intact Psych: euthymic mood, full affect  Wt Readings from Last 3 Encounters:  06/22/17 158 lb 12.8 oz (72 kg)  05/25/17 157 lb 4 oz (71.3 kg)  05/23/17 160 lb (72.6 kg)      Studies/Labs Reviewed:   EKG:  EKG tachycardia at 115 bpm with nonspecific ST abnormality.  Low voltage is noted.  Low voltage is new compared to prior.  Recent Labs: 05/23/2017: BUN 10;  Creatinine, Ser 0.61; Hemoglobin 12.2; Platelets 217; Potassium 3.4; Sodium 138 05/25/2017: ALT 33; TSH 0.95   Lipid Panel    Component Value Date/Time   CHOL 199 04/09/2015 0850   TRIG 95.0 04/09/2015 0850   HDL 68.90 04/09/2015 0850   CHOLHDL 3 04/09/2015 0850   VLDL 19.0 04/09/2015 0850  LDLCALC 111 (H) 04/09/2015 0850    Additional studies/ records that were reviewed today include:  2018 CT Angie of the chest:  IMPRESSION: 1. No evidence of aortic dissection. No evidence of aneurysmal dilatation. 2. No evidence of significant pulmonary embolus. 3. No acute abnormality seen within the chest, abdomen or pelvis. Aortic Atherosclerosis (ICD10-I70.0).   ASSESSMENT:    1. Chest pain, unspecified type   2. SOB (shortness of breath)   3. Sinus tachycardia   4. Essential hypertension   5. H/O alcohol abuse   6. Gastroesophageal reflux disease, esophagitis presence not specified      PLAN:  In order of problems listed above:  1. Uncertain etiology.  Have not totally excluded the possibility of pericarditis but there are no clinical features based on EKG and exam.  Could be costochondritis.  2D Doppler echocardiogram will be done.  Exercise treadmill test will be done to exclude CAD.  Colchicine 0.6 mg p.o. twice daily as a trial for possible pericarditis x7 days. 2. Shortness of breath is of uncertain etiology.  Rule out recurrent cardiomyopathy.  Patient is currently not pregnant.  2D Doppler echocardiogram will be done.  BNP will be obtained. 3. TSH to exclude hyperthyroidism and CBC to rule out anemia.  Metoprolol succinate 25 mg/day. 4. Metoprolol succinate 25 mg/day will be added to her current medical regimen.  This will also help to better control her blood pressure.   Etiology of her complaints is unclear.  Given risk factors including aortic atherosclerosis, prior cardiomyopathy and PE, reevaluation with echo to exclude LV systolic dysfunction and pericardial disease  rapid heart rate raises the question hyperthyroidism or anemia.  Appropriate blood testing will be done.  Colchicine is started on the outside chance that chest discomfort is related to pericarditis.   Further work-up and evaluation will be dependent upon findings. Medication Adjustments/Labs and Tests Ordered: Current medicines are reviewed at length with the patient today.  Concerns regarding medicines are outlined above.  Medication changes, Labs and Tests ordered today are listed in the Patient Instructions below. There are no Patient Instructions on file for this visit.   Signed, Lesleigh NoeHenry W Smith III, MD  06/22/2017 9:56 AM    [facility] [address], [place], [place]  [address] Phone: [phone]; Fax: [phone]

## 2017-06-22 NOTE — Patient Instructions (Addendum)
Medication Instructions:  1) START Colchicine 0.6mg - one tablet twice daily 2) START Metoprolol Succinate 25mg  once daily  Labwork: BMET, CBC, Pro BNP and TSH today  Testing/Procedures: Your physician has requested that you have an echocardiogram ASAP. Echocardiography is a painless test that uses sound waves to create images of your heart. It provides your doctor with information about the size and shape of your heart and how well your heart's chambers and valves are working. This procedure takes approximately one hour. There are no restrictions for this procedure.  Your physician has requested that you have an exercise tolerance test. For further information please visit [email or web address]. Please also follow instruction sheet, as given.   Follow-Up: Your physician recommends that you schedule a follow-up appointment pending results/ as needed with Dr. Katrinka BlazingSmith.    Any Other Special Instructions Will Be Listed Below (If Applicable).     If you need a refill on your cardiac medications before your next appointment, please call your pharmacy.

## 2017-06-23 ENCOUNTER — Other Ambulatory Visit: Payer: Self-pay

## 2017-06-23 ENCOUNTER — Ambulatory Visit (HOSPITAL_COMMUNITY): Payer: 59 | Attending: Cardiovascular Disease

## 2017-06-23 ENCOUNTER — Other Ambulatory Visit: Payer: Self-pay | Admitting: *Deleted

## 2017-06-23 DIAGNOSIS — R079 Chest pain, unspecified: Secondary | ICD-10-CM | POA: Diagnosis not present

## 2017-06-23 DIAGNOSIS — I10 Essential (primary) hypertension: Secondary | ICD-10-CM | POA: Insufficient documentation

## 2017-06-23 DIAGNOSIS — R0602 Shortness of breath: Secondary | ICD-10-CM | POA: Insufficient documentation

## 2017-06-23 MED ORDER — MITIGARE 0.6 MG PO CAPS: ORAL_CAPSULE | ORAL | 0 refills | Status: DC

## 2017-06-25 ENCOUNTER — Ambulatory Visit (INDEPENDENT_AMBULATORY_CARE_PROVIDER_SITE_OTHER): Payer: 59

## 2017-06-25 DIAGNOSIS — R079 Chest pain, unspecified: Secondary | ICD-10-CM

## 2017-06-25 DIAGNOSIS — R0602 Shortness of breath: Secondary | ICD-10-CM | POA: Diagnosis not present

## 2017-06-25 LAB — EXERCISE TOLERANCE TEST
Estimated workload: 7.8 METS
Exercise duration (min): 6 min
Exercise duration (sec): 32 s
MPHR: 175 {beats}/min
Peak HR: 151 {beats}/min
Percent HR: 86 %
RPE: 17
Rest HR: 95 {beats}/min

## 2017-07-30 ENCOUNTER — Ambulatory Visit: Payer: Self-pay | Admitting: Family Medicine

## 2017-07-30 NOTE — Telephone Encounter (Signed)
Pt reports bilateral foot pain x 3 weeks. States pain at "Pad of feet and heels." States hard nodules right above heels, noted 3 weeks ago. Pain and "Bumps" at heels have worsened since "Walking a lot and climbing stairs while on vacation."  Reports pain 7/10 with ambulation. States "Bumps" are hard, 1 one each heel, size of width of a golf ball." Denies any redness or warmth, no calf pain. Does reports mild ankle swelling, lessens with elevating feet. States nodules "Stick out" 1 to 1- 1/2 inches. Has been taking motrin for pain, ineffective. Same day appt made for tomorrow with Harlin Heys. Gessner after speaking with Artelia LarocheRena to approve Same Day; pt unable to make it to any appt today. Care advise given per protocol. Instructed to go to ED if symptoms worsen, any redness , warmth, SOB, calf pain, swelling occurs. Pt verbalizes understanding.  Reason for Disposition . [1] Swollen foot AND [2] no fever  (Exceptions: localized bump from bunions, calluses, insect bite, sting)  Answer Assessment - Initial Assessment Questions 1. ONSET: "When did the pain start?"      3 weeks ago 2. LOCATION: "Where is the pain located?"      Heels on both feet, pad, not arches 3. PAIN: "How bad is the pain?"    (Scale 1-10; or mild, moderate, severe)   -  MILD (1-3): doesn't interfere with normal activities    -  MODERATE (4-7): interferes with normal activities (e.g., work or school) or awakens from sleep, limping    -  SEVERE (8-10): excruciating pain, unable to do any normal activities, unable to walk     7/10 when walking 4. WORK OR EXERCISE: "Has there been any recent work or exercise that involved this part of the body?"      A lot of walking 5. CAUSE: "What do you think is causing the foot pain?"     Unsure 6. OTHER SYMPTOMS: "Do you have any other symptoms?" (e.g., leg pain, rash, fever, numbness)      Swelling at ankles at home  Protocols used: FOOT PAIN-A-AH

## 2017-07-31 ENCOUNTER — Ambulatory Visit (INDEPENDENT_AMBULATORY_CARE_PROVIDER_SITE_OTHER)
Admission: RE | Admit: 2017-07-31 | Discharge: 2017-07-31 | Disposition: A | Discharge status: Home / Self Care | Payer: 59 | Source: Ambulatory Visit | Attending: Family Medicine | Admitting: Family Medicine

## 2017-07-31 ENCOUNTER — Encounter: Payer: Self-pay | Admitting: Family Medicine

## 2017-07-31 ENCOUNTER — Ambulatory Visit: Payer: 59 | Admitting: Family Medicine

## 2017-07-31 ENCOUNTER — Other Ambulatory Visit: Payer: Self-pay | Admitting: Family Medicine

## 2017-07-31 VITALS — BP 158/90 | HR 78 | Temp 97.5°F | Ht 65.0 in | Wt 161.5 lb

## 2017-07-31 DIAGNOSIS — M7989 Other specified soft tissue disorders: Secondary | ICD-10-CM

## 2017-07-31 DIAGNOSIS — M79671 Pain in right foot: Secondary | ICD-10-CM

## 2017-07-31 DIAGNOSIS — M6788 Other specified disorders of synovium and tendon, other site: Secondary | ICD-10-CM

## 2017-07-31 DIAGNOSIS — M79672 Pain in left foot: Secondary | ICD-10-CM

## 2017-07-31 DIAGNOSIS — S92515A Nondisplaced fracture of proximal phalanx of left lesser toe(s), initial encounter for closed fracture: Secondary | ICD-10-CM | POA: Diagnosis not present

## 2017-07-31 NOTE — Progress Notes (Signed)
Subjective:    Patient ID: Nicole RossettiMargaret M Hughes, female    DOB: [birth year], 47 y.o.   MRN: [id number]  HPI This is a 47 yo female who presents today with bilateral foot pain x 3 weeks. Pain first thing in the morning and as she walks throughout the day. Pain is severe in heel and up achilles tendons which have been swollen and are getting larger. Went to beach last weekend and was up and down stairs and pain has gotten worse. Some swelling of ankles. No problems with feet before. Has been taking ibuprofen 800 mg BID and tried ice without relief. Has been unable to wear shoes other than flip flops due to pain. Has been trying to avoid ambulating as much as possible.   Past Medical History:  Diagnosis Date  . Anxiety   . Cardiomyopathy (HCC)   . Chronic lower back pain   . Complication of anesthesia    "hard to get under"  . Cyst of ovary   . Depression    hx  . DVT complicating pregnancy 08/1998   RLE; RUE  . Dysmenorrhea   . Dyspareunia   . 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 GERD (gastroesophageal reflux disease)   . Hay fever    "fall and spring" (02/17/2017)  . Headache    "2-3 times/.wik" (02/17/2017)  . History of chicken pox   . Hypertension   . Migraine    "monthly" (02/17/2017)  . Pelvic pain   . Pulmonary embolism (HCC) 08/1998   S/P childbirth   Past Surgical History:  Procedure Laterality Date  . CESAREAN SECTION  1994  . ECTOPIC PREGNANCY SURGERY  2010  . LAPAROSCOPY N/A 09/24/2012   Procedure: OPERATIVE LAPROSCOPY WITH LYSIS OF ADHESIONS;  Surgeon: Sherron MondayJody [name], MD;  Location: [facility];  Service: Gynecology;  Laterality: N/A;  . TONSILLECTOMY  1984  . TUBAL LIGATION  2010   Family History  Problem Relation Age of Onset  . Cancer - Lung Mother   . Heart failure Father   . Hypertension Father    Social History   Tobacco Use  . Smoking status: Never Smoker  . Smokeless tobacco: Never Used  Substance Use Topics  . Alcohol use: Not Currently   Alcohol/week: 3.0 oz    Types: 5 Cans of beer per week    Frequency: Never    Comment: has quit (March 2019)  . Drug use: No      Review of Systems Per HPI    Objective:   Physical Exam  Constitutional: She is oriented to person, place, and time. She appears well-developed and well-nourished. No distress.  Eyes: Conjunctivae are normal.  Cardiovascular: Normal rate.  Pulmonary/Chest: Effort normal.  Musculoskeletal:  Bilateral feet with bony prominence of distal achilles. Mild generalized edema, strong PT/DP pulses. Good ROM, pain with flexion, extension, palpation of bilateral heels. Warm, normal color.   Neurological: She is alert and oriented to person, place, and time.  Skin: Skin is warm and dry. She is not diaphoretic.  Psychiatric: She has a normal mood and affect. Her behavior is normal. Judgment and thought content normal.  Vitals reviewed.     BP (!) 158/90 (BP Location: Right Arm, Patient Position: Sitting, Cuff Size: Normal)   Pulse 78   Temp (!) 97.5 F (36.4 C) (Oral)   Ht 5\' 5"  (1.651 m)   Wt 161 lb 8 oz (73.3 kg)   LMP 07/28/2017   SpO2 100%   BMI 26.88 kg/m  Wt Readings from Last 3 Encounters:  07/31/17 161 lb 8 oz (73.3 kg)  06/22/17 158 lb 12.8 oz (72 kg)  05/25/17 157 lb 4 oz (71.3 kg)       Assessment & Plan:  1. Bilateral foot pain - suspect plantar fascitis, discussed stretching, using heat, OTC analgesics, will check xray due to severity of pain and bony prominences over achilles.  - If no improvement in 3-5 days, encouraged her to make appointment with Dr. Patsy Lager (Sports med) - DG Foot Complete Left; Future - DG Foot Complete Right; Future  2. Bilateral swelling of feet - DG Foot Complete Left; Future - DG Foot Complete Right; Future   Olean Ree, FNP-BC  [facility], [facility]  07/31/2017 10:29 AM

## 2017-07-31 NOTE — Patient Instructions (Signed)
Good to see you today  Gentle range of motion with feet several times a day, use heat before as tolerated  Take ibuprofen 600 mg three times a day, can alternate with Tylenol (acetaminophen)  If not better in 5-7 days, schedule a follow up appointment with Dr. Patsy Lageropland (sports medicine in this office)

## 2017-08-11 ENCOUNTER — Ambulatory Visit: Payer: 59 | Admitting: Sports Medicine

## 2017-08-11 ENCOUNTER — Ambulatory Visit (INDEPENDENT_AMBULATORY_CARE_PROVIDER_SITE_OTHER): Payer: 59

## 2017-08-11 ENCOUNTER — Encounter: Payer: Self-pay | Admitting: Sports Medicine

## 2017-08-11 DIAGNOSIS — M7662 Achilles tendinitis, left leg: Secondary | ICD-10-CM

## 2017-08-11 DIAGNOSIS — M79671 Pain in right foot: Secondary | ICD-10-CM

## 2017-08-11 DIAGNOSIS — M779 Enthesopathy, unspecified: Secondary | ICD-10-CM | POA: Diagnosis not present

## 2017-08-11 DIAGNOSIS — M79673 Pain in unspecified foot: Secondary | ICD-10-CM

## 2017-08-11 DIAGNOSIS — M79672 Pain in left foot: Secondary | ICD-10-CM

## 2017-08-11 DIAGNOSIS — M7661 Achilles tendinitis, right leg: Secondary | ICD-10-CM | POA: Diagnosis not present

## 2017-08-11 MED ORDER — HYDROCODONE-ACETAMINOPHEN 5-325 MG PO TABS: 1.0000 | ORAL_TABLET | Freq: Four times a day (QID) | ORAL | 0 refills | Status: DC | PRN

## 2017-08-11 MED ORDER — METHYLPREDNISOLONE 4 MG PO TBPK: ORAL_TABLET | ORAL | 0 refills | Status: DC

## 2017-08-11 NOTE — Progress Notes (Signed)
Subjective: Nicole Hughes is a 47 y.o. female patient who presents to office for evaluation of Right=Left heel pain. Patient complains of progressive pain especially over the last 5 weeks at the Achilles after beach trip. Ranks pain 10/10 and is now interferring with daily activities. Patient has tried rest and advil with no relief in symptoms. Patient denies any other pedal complaints.   Review of Systems  Musculoskeletal: Positive for joint pain and myalgias.  All other systems reviewed and are negative.    Patient Active Problem List   Diagnosis Date Noted  . Palpitations 05/25/2017  . Chest wall pain 05/25/2017  . Elevated transaminase level 05/25/2017  . Depression with anxiety 02/23/2017  . 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 Hyponatremia 02/17/2017  . Hypokalemia 09/24/2016  . H/O alcohol abuse 09/22/2016  . H/O acute pancreatitis 09/22/2016  . GERD (gastroesophageal reflux disease) 04/09/2015  . 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 Chest pain 08/20/2012  . Stress reaction 07/23/2012  . Essential hypertension 06/09/2012  . History of pulmonary embolism 06/09/2012  . Preop cardiovascular exam 06/08/2012  . Dysmenorrhea   . Dyspareunia   . Pelvic pain     Current Outpatient Medications on File Prior to Visit  Medication Sig Dispense Refill  . acetaminophen (TYLENOL) 325 MG tablet Take 2 tablets (650 mg total) by mouth every 6 (six) hours as needed. Do not take more than 4000mg  of tylenol per day 30 tablet 0  . amLODipine (NORVASC) 5 MG tablet Take 1 tablet (5 mg total) by mouth daily. 30 tablet 5  . cyclobenzaprine (FLEXERIL) 10 MG tablet Take 1 tablet (10 mg total) by mouth 3 (three) times daily as needed for muscle spasms. And chest wall pain 90 tablet 1  . ibuprofen (ADVIL,MOTRIN) 400 MG tablet Take 1 tablet (400 mg total) by mouth every 6 (six) hours as needed. 30 tablet 0  . lisinopril (PRINIVIL,ZESTRIL) 10 MG tablet Take 1 tablet (10 mg total) by mouth daily. 30  tablet 5  . metoprolol succinate (TOPROL XL) 25 MG 24 hr tablet Take 1 tablet (25 mg total) by mouth daily. 90 tablet 3  . MITIGARE 0.6 MG CAPS Take 1 tablet by mouth twice daily for 1 week 14 capsule 0  . Multiple Vitamin (MULTIVITAMIN WITH MINERALS) TABS tablet Take 1 tablet by mouth daily. 30 tablet 0  . omeprazole (PRILOSEC) 20 MG capsule Take 1 capsule (20 mg total) by mouth daily. 30 capsule 5   No current facility-administered medications on file prior to visit.     No Known Allergies  Objective:  General: Alert and oriented x3 in no acute distress  Dermatology: No open lesions bilateral lower extremities, no webspace macerations, no ecchymosis bilateral, all nails x 10 are well manicured.  Vascular: Dorsalis Pedis and Posterior Tibial pedal pulses 2/4, Capillary Fill Time 3 seconds, + pedal hair growth bilateral, no edema bilateral lower extremities, Temperature gradient within normal limits.  Neurology: Michaell Cowing sensation intact via light touch bilateral.  Musculoskeletal: Moderate tenderness with palpation at insertion of the Achilles on Right & Left, there is calcaneal exostosis with moderate soft tissue swelling present and decreased ankle rom with knee extending  vs flexed resembling gastroc equnius bilateral, The achilles tendon feels intact with no nodularity or palpable dell, Thompson sign negative.  Gait: Antalgic gait   Xrays  Right/Left Foot    Impression: Normal osseous mineralization. Joint spaces preserved except at right ankle where there is arthritis, early. No fracture/dislocation/boney destruction except at spur.  Calcaneal spur present. Kager's triangle intact with no obliteration. + soft tissue swelling, no radiopaque foreign bodies.   Assessment and Plan: Problem List Items Addressed This Visit    None    Visit Diagnoses    Achilles tendonitis, bilateral    -  Primary   Relevant Medications   HYDROcodone-acetaminophen (NORCO) 5-325 MG tablet    methylPREDNISolone (MEDROL DOSEPAK) 4 MG TBPK tablet   Other Relevant Orders   DG Ankle Complete Right (Completed)   DG Ankle Complete Left (Completed)   Inflammatory heel pain, unspecified laterality       Relevant Medications   HYDROcodone-acetaminophen (NORCO) 5-325 MG tablet   methylPREDNISolone (MEDROL DOSEPAK) 4 MG TBPK tablet   Heel pain, bilateral          -Complete examination performed -Xrays reviewed -Discussed treatement options -Rx Medrol dose pak and heel lifts -No stretching until the inflammation is much improved and advised to avoid activities that will aggreivate the heels -No improvement will consider MRI/PT/EPAT -Patient to return to office in 2 weeks or sooner if condition worsens.  Asencion Islamitorya Oumar Marcott, DPM

## 2017-08-11 NOTE — Patient Instructions (Addendum)
Achilles Tendinitis  ?with Rehab ?Achilles tendinitis is a disorder of the Achilles tendon. The Achilles tendon connects the large calf muscles (Gastrocnemius and Soleus) to the heel bone (calcaneus). This tendon is sometimes called the heel cord. It is important for pushing-off and standing on your toes and is important for walking, running, or jumping. Tendinitis is often caused by overuse and repetitive microtrauma. ?SYMPTOMS ?Pain, tenderness, swelling, warmth, and redness may occur over the Achilles tendon even at rest. ?Pain with pushing off, or flexing or extending the ankle. ?Pain that is worsened after or during activity. ?CAUSES  ?Overuse sometimes seen with rapid increase in exercise programs or in sports requiring running and jumping. ?Poor physical conditioning (strength and flexibility or endurance). ?Running sports, especially training running down hills. ?Inadequate warm-up before practice or play or failure to stretch before participation. ?Injury to the tendon. ?PREVENTION  ?Warm up and stretch before practice or competition. ?Allow time for adequate rest and recovery between practices and competition. ?Keep up conditioning. ?Keep up ankle and leg flexibility. ?Improve or keep muscle strength and endurance. ?Improve cardiovascular fitness. ?Use proper technique. ?Use proper equipment (shoes, skates). ?To help prevent recurrence, taping, protective strapping, or an adhesive bandage may be recommended for several weeks after healing is complete. ?PROGNOSIS  ?Recovery may take weeks to several months to heal. ?Longer recovery is expected if symptoms have been prolonged. ?Recovery is usually quicker if the inflammation is due to a direct blow as compared with overuse or sudden strain. ?RELATED COMPLICATIONS  ?Healing time will be prolonged if the condition is not correctly treated. The injury must be given plenty of time to heal. ?Symptoms can reoccur if activity is resumed too soon. ?Untreated,  tendinitis may increase the risk of tendon rupture requiring additional time for recovery and possibly surgery. ?TREATMENT  ?The first treatment consists of rest anti-inflammatory medication, and ice to relieve the pain. ?Stretching and strengthening exercises after resolution of pain will likely help reduce the risk of recurrence. Referral to a physical therapist or athletic trainer for further evaluation and treatment may be helpful. ?A walking boot or cast may be recommended to rest the Achilles tendon. This can help break the cycle of inflammation and microtrauma. ?Arch supports (orthotics) may be prescribed or recommended by your caregiver as an adjunct to therapy and rest. ?Surgery to remove the inflamed tendon lining or degenerated tendon tissue is rarely necessary and has shown less than predictable results. ?MEDICATION  ?Nonsteroidal anti-inflammatory medications, such as aspirin and ibuprofen, may be used for pain and inflammation relief. Do not take within 7 days before surgery. Take these as directed by your caregiver. Contact your caregiver immediately if any bleeding, stomach upset, or signs of allergic reaction occur. Other minor pain relievers, such as acetaminophen, may also be used. ?Pain relievers may be prescribed as necessary by your caregiver. Do not take prescription pain medication for longer than 4 to 7 days. Use only as directed and only as much as you need. ?Cortisone injections are rarely indicated. Cortisone injections may weaken tendons and predispose to rupture. It is better to give the condition more time to heal than to use them. ?HEAT AND COLD ?Cold is used to relieve pain and reduce inflammation for acute and chronic Achilles tendinitis. Cold should be applied for 10 to 15 minutes every 2 to 3 hours for inflammation and pain and immediately after any activity that aggravates your symptoms. Use ice packs or an ice massage. ?Heat may be used before performing stretching   and  strengthening activities prescribed by your caregiver. Use a heat pack or a warm soak. ?SEEK MEDICAL CARE IF: ?Symptoms get worse or do not improve in 2 weeks despite treatment. ?New, unexplained symptoms develop. Drugs used in treatment may produce side effects. ? ? ?

## 2017-08-13 ENCOUNTER — Other Ambulatory Visit: Payer: Self-pay | Admitting: Family Medicine

## 2017-08-24 ENCOUNTER — Ambulatory Visit: Payer: 59 | Admitting: Family Medicine

## 2017-08-24 ENCOUNTER — Other Ambulatory Visit: Payer: Self-pay | Admitting: Family Medicine

## 2017-08-24 MED ORDER — CYCLOBENZAPRINE HCL 10 MG PO TABS: 10.0000 mg | ORAL_TABLET | Freq: Three times a day (TID) | ORAL | 0 refills | Status: DC | PRN

## 2017-08-24 NOTE — Telephone Encounter (Signed)
Copied from CRM [id number]. Topic: Quick Communication - Rx Refill/Question >> Aug 24, 2017  8:46 AM Arlyss Gandyichardson, Leaira Fullam N, NT wrote: Medication: cyclobenzaprine (FLEXERIL) 10 MG tablet    States the back pain has gotten worse  Has the patient contacted their pharmacy? Yes.   (Agent: If no, request that the patient contact the pharmacy for the refill.) (Agent: If yes, when and what did the pharmacy advise?)  Preferred Pharmacy (with phone number or street name): [facility] [address] - [place] ([address]), [place] - [address] [phone] (Phone) [phone] (Fax)  Requesting call when sent to pharmacy     Agent: Please be advised that RX refills may take up to 3 business days. We ask that you follow-up with your pharmacy.

## 2017-08-24 NOTE — Telephone Encounter (Signed)
Pain in both feet with tendonitis; pt having to see podiatrist q 2 weeks and expensive copay. Pt cannot afford another co pay at this time . Pt wants to see if can get refill of flexeril due to back pain. [facility]. Pt request cb. Pt last saw Dr Milinda Antisower on 05/25/17. Cyclobenzaprine last filled # 90 x 1 on 06/17/17.Please advise.

## 2017-08-24 NOTE — Telephone Encounter (Signed)
I refilled it once Try not to use it around the clock- I don't want her to be dependent on it and it is sedating

## 2017-08-24 NOTE — Telephone Encounter (Signed)
PLEASE NOTE: All timestamps contained within this report are represented as [place] Standard Time. CONFIDENTIALTY NOTICE: This fax transmission is intended only for the addressee. It contains information that is legally privileged, confidential or otherwise protected from use or disclosure. If you are not the intended recipient, you are strictly prohibited from reviewing, disclosing, copying using or disseminating any of this information or taking any action in reliance on or regarding this information. If you have received this fax in error, please notify us immediately by telephone so that we can arrange for its return to us. Phone: [phone], Toll-Free: [phone], Fax: [phone] Page: 1 of 2 Call Id: [id number] [facility] Client TELEPHONE ADVICE RECORD [facility] Patient Name: Nicole Hughes Gender: Female DOB: November 01, 1970 Age: [age] Y 4 M 23 D Return Phone Number: [phone] (Primary) Address: City/State/Zip: [place] [id number] Client [facility] Night Physician [place], Idamae SchullerMarne - MD Contact Type Call Who Is Calling Patient / Member / Family / Caregiver Call Type Triage / Clinical Relationship To Patient Self Return Phone Number [phone] (Primary) Chief Complaint Back Pain - General Reason for Call Symptomatic / Request for Health Information Initial Comment Caller states she has tendenitis in both feet, back issues, prescribed muscle relaxers, and needing another script wrote, or needing to be seen. She is wanting to avoid foot doctor. Constant pain, not slept. Translation No Nurse Assessment Nurse: Colon Brancharson, RN, Irving BurtonEmily Date/Time Lamount Cohen(Eastern Time): 08/23/2017 3:38:54 PM Confirm and document reason for call. If symptomatic, describe symptoms. ---Caller states her doctor suggested she see a chiropractor for back pain and has been RX'd muscle relaxers.  Having severe midback pain, current pain level 12/10. Has not slept in 2 days due to pain. Has developed bilat tendonitis in feet. Either wanting RX or to be seen. Does the patient have any new or worsening symptoms? ---Yes Will a triage be completed? ---Yes Related visit to physician within the last 2 weeks? ---Yes Does the PT have any chronic conditions? (i.e. diabetes, asthma, this includes High risk factors for pregnancy, etc.) ---Yes List chronic conditions. ---Back pain, bilat tendonitis, HTN, GER Is the patient pregnant or possibly pregnant? (Ask all females between the ages of 4512-55) ---No Is this a behavioral health or substance abuse call? ---No Guidelines Guideline Title Affirmed Question Affirmed Notes Nurse Date/Time (Eastern Time) Back Pain [1] SEVERE back pain (e.g., excruciating, unable to do any normal activities) AND [2] not improved 2 hours after pain medicine Daymon LarsenCarson, RN, Emily 08/23/2017 3:41:27 PM PLEASE NOTE: All timestamps contained within this report are represented as [place] Standard Time. CONFIDENTIALTY NOTICE: This fax transmission is intended only for the addressee. It contains information that is legally privileged, confidential or otherwise protected from use or disclosure. If you are not the intended recipient, you are strictly prohibited from reviewing, disclosing, copying using or disseminating any of this information or taking any action in reliance on or regarding this information. If you have received this fax in error, please notify us immediately by telephone so that we can arrange for its return to us. Phone: [phone], Toll-Free: [phone], Fax: [phone] Page: 2 of 2 Call Id: [id number] Disp. Time Lamount Cohen(Eastern Time) Disposition Final User 08/23/2017 3:46:46 PM See HCP within 4 Hours (or PCP triage) Yes Colon Brancharson, RN, Judie BonusEmily Caller Disagree/Comply Disagree Caller Understands Yes PreDisposition Did not know what to do Care Advice Given Per  Guideline SEE HCP WITHIN 4 HOURS (  OR PCP TRIAGE): * IF OFFICE WILL BE CLOSED AND NO PCP (PRIMARY CARE PROVIDER) SECOND-LEVEL TRIAGE: You need to be seen within the next 3 or 4 hours. A nearby Urgent Care Center [facility] is often a good source of care. Another choice is to go to the ED. Go sooner if you become worse. PAIN MEDICINES: * For pain relief, take acetaminophen, ibuprofen, or naproxen. NAPROXEN (E.G., ALEVE): * Take 220 mg (one 220 mg pill) by mouth every 8 hours as needed. You may take 440 mg (two 220 mg pills) for your first dose. ACETAMINOPHEN (E.G., TYLENOL): * Another choice is to take 1,000 mg (two 500 mg pills) every 8 hours as needed. Each Extra Strength Tylenol pill has 500 mg of acetaminophen. The most you should take each day is 3,000 mg (6 Extra Strength pills a day). CALL BACK IF: * You become worse. CARE ADVICE given per Back Pain (Adult) guideline. Comments User: Arnaldo NatalEmily, Carson, RN Date/Time (Eastern Time): 08/23/2017 3:46:41 PM Does not want to be seen anywhere today, wants to wait and call office first thing tomorrow morning to try to be seen. Referrals GO TO FACILITY REFUSED

## 2017-08-24 NOTE — Telephone Encounter (Signed)
Per chart review pt saw podiatry 08/11/17; was given medrol dosepak and hydrocodone apap.Please advise.

## 2017-08-24 NOTE — Telephone Encounter (Signed)
Left VM letting pt know Rx was sent and I advise her of Dr. Royden Purlower's comments on her VM

## 2017-08-25 ENCOUNTER — Ambulatory Visit: Payer: 59 | Admitting: Sports Medicine

## 2017-08-25 ENCOUNTER — Encounter: Payer: Self-pay | Admitting: Sports Medicine

## 2017-08-25 DIAGNOSIS — M7661 Achilles tendinitis, right leg: Secondary | ICD-10-CM | POA: Diagnosis not present

## 2017-08-25 DIAGNOSIS — M79673 Pain in unspecified foot: Secondary | ICD-10-CM | POA: Diagnosis not present

## 2017-08-25 DIAGNOSIS — M7662 Achilles tendinitis, left leg: Secondary | ICD-10-CM | POA: Diagnosis not present

## 2017-08-25 DIAGNOSIS — M79671 Pain in right foot: Secondary | ICD-10-CM | POA: Diagnosis not present

## 2017-08-25 DIAGNOSIS — M79672 Pain in left foot: Secondary | ICD-10-CM

## 2017-08-25 MED ORDER — METHYLPREDNISOLONE 4 MG PO TBPK: ORAL_TABLET | ORAL | 0 refills | Status: DC

## 2017-08-25 MED ORDER — HYDROCODONE-ACETAMINOPHEN 5-325 MG PO TABS: 1.0000 | ORAL_TABLET | Freq: Four times a day (QID) | ORAL | 0 refills | Status: DC | PRN

## 2017-08-25 NOTE — Patient Instructions (Signed)
Achilles Tendinitis Achilles tendinitis is inflammation of the tough, cord-like band that attaches the lower leg muscles to the heel bone (Achilles tendon). This is usually caused by overusing the tendon and the ankle joint. Achilles tendinitis usually gets better over time with treatment and caring for yourself at home. It can take weeks or months to heal completely. What are the causes? This condition may be caused by:  A sudden increase in exercise or activity, such as running.  Doing the same exercises or activities (such as jumping) over and over.  Not warming up calf muscles before exercising.  Exercising in shoes that are worn out or not made for exercise.  Having arthritis or a bone growth (spur) on the back of the heel bone. This can rub against the tendon and hurt it.  Age-related wear and tear. Tendons become less flexible with age and more likely to be injured.  What are the signs or symptoms? Common symptoms of this condition include:  Pain in the Achilles tendon or in the back of the leg, just above the heel. The pain usually gets worse with exercise.  Stiffness or soreness in the back of the leg, especially in the morning.  Swelling of the skin over the Achilles tendon.  Thickening of the tendon.  Bone spurs at the bottom of the Achilles tendon, near the heel.  Trouble standing on tiptoe.  How is this diagnosed? This condition is diagnosed based on your symptoms and a physical exam. You may have tests, including:  X-rays.  MRI.  How is this treated? The goal of treatment is to relieve symptoms and help your injury heal. Treatment may include:  Decreasing or stopping activities that caused the tendinitis. This may mean switching to low-impact exercises like biking or swimming.  Icing the injured area.  Doing physical therapy, including strengthening and stretching exercises.  NSAIDs to help relieve pain and swelling.  Using supportive shoes, wraps,  heel lifts, or a walking boot (air cast).  Surgery. This may be done if your symptoms do not improve after 6 months.  Using high-energy shock wave impulses to stimulate the healing process (extracorporeal shock wave therapy). This is rare.  Injection of medicines to help relieve inflammation (corticosteroids). This is rare.  Follow these instructions at home: If you have an air cast:  Wear the cast as told by your health care provider. Remove it only as told by your health care provider.  Loosen the cast if your toes tingle, become numb, or turn cold and blue. Activity  Gradually return to your normal activities once your health care provider approves. Do not do activities that cause pain. ? Consider doing low-impact exercises, like cycling or swimming.  If you have an air cast, ask your health care provider when it is safe for you to drive.  If physical therapy was prescribed, do exercises as told by your health care provider or physical therapist. Managing pain, stiffness, and swelling  Raise (elevate) your foot above the level of your heart while you are sitting or lying down.  Move your toes often to avoid stiffness and to lessen swelling.  If directed, put ice on the injured area: ? Put ice in a plastic bag. ? Place a towel between your skin and the bag. ? Leave the ice on for 20 minutes, 2-3 times a day General instructions  If directed, wrap your foot with an elastic bandage or other wrap. This can help keep your tendon from moving too much   while it heals. Your health care provider will show you how to wrap your foot correctly.  Wear supportive shoes or heel lifts only as told by your health care provider.  Take over-the-counter and prescription medicines only as told by your health care provider.  Keep all follow-up visits as told by your health care provider. This is important. Contact a health care provider if:  You have symptoms that gets worse.  You have pain  that does not get better with medicine.  You develop new, unexplained symptoms.  You develop warmth and swelling in your foot.  You have a fever. Get help right away if:  You have a sudden popping sound or sensation in your Achilles tendon followed by severe pain.  You cannot move your toes or foot.  You cannot put any weight on your foot. Summary  Achilles tendinitis is inflammation of the tough, cord-like band that attaches the lower leg muscles to the heel bone (Achilles tendon).  This condition is usually caused by overusing the tendon and the ankle joint. It can also be caused by arthritis or normal aging.  The most common symptoms of this condition include pain, swelling, or stiffness in the Achilles tendon or in the back of the leg.  This condition is usually treated with rest, NSAIDs, and physical therapy. This information is not intended to replace advice given to you by your health care provider. Make sure you discuss any questions you have with your health care provider. Document Released: 10/02/2004 Document Revised: 11/12/2015 Document Reviewed: 11/12/2015 Elsevier Interactive Patient Education  2017 [employer]   Achilles Tendinitis Rehab Ask your health care provider which exercises are safe for you. Do exercises exactly as told by your health care provider and adjust them as directed. It is normal to feel mild stretching, pulling, tightness, or discomfort as you do these exercises, but you should stop right away if you feel sudden pain or your pain gets worse. Do not begin these exercises until told by your health care provider. Stretching and range of motion exercises These exercises warm up your muscles and joints and improve the movement and flexibility of your ankle. These exercises also help to relieve pain, numbness, and tingling. Exercise A: Standing wall calf stretch, knee straight  1. Stand with your hands against a wall. 2. Extend your __________ leg  behind you and bend your front knee slightly. Keep both of your heels on the floor. 3. Point the toes of your back foot slightly inward. 4. Keeping your heels on the floor and your back knee straight, shift your weight toward the wall. Do not allow your back to arch. You should feel a gentle stretch in your calf. 5. Hold this position for seconds. Repeat __________ times. Complete this stretch __________ times per day. Exercise B: Standing wall calf stretch, knee bent 1. Stand with your hands against a wall. 2. Extend your __________ leg behind you, and bend your front knee slightly. Keep both of your heels on the floor. 3. Point the toes of your back foot slightly inward. 4. Keeping your heels on the floor, unlock your back knee so that it is bent. You should feel a gentle stretch deep in your calf. 5. Hold this position for __________ seconds. Repeat __________ times. Complete this stretch __________ times per day. Strengthening exercises These exercises build strength and control of your ankle. Endurance is the ability to use your muscles for a long time, even after they get tired. Exercise C:   Plantar flexion with band  1. Sit on the floor with your __________ leg extended. You may put a pillow under your calf to give your foot more room to move. 2. Loop a rubber exercise band or tube around the ball of your __________ foot. The ball of your foot is on the walking surface, right under your toes. The band or tube should be slightly tense when your foot is relaxed. If the band or tube slips, you can put on your shoe or put a washcloth between the band and your foot to help it stay in place. 3. Slowly point your toes downward, pushing them away from you. 4. Hold this position for __________ seconds. 5. Slowly release the tension in the band or tube, controlling smoothly until your foot is back to the starting position. Repeat __________ times. Complete this exercise __________ times per  day. Exercise D: Heel raise with eccentric lower  1. Stand on a step with the balls of your feet. The ball of your foot is on the walking surface, right under your toes. ? Do not put your heels on the step. ? For balance, rest your hands on the wall or on a railing. 2. Rise up onto the balls of your feet. 3. Keeping your heels up, shift all of your weight to your __________ leg and pick up your other leg. 4. Slowly lower your __________ leg so your heel drops below the level of the step. 5. Put down your foot. If told by your health care provider, build up to:  3 sets of 15 repetitions while keeping your knees straight.  3 sets of 15 repetitions while keeping your knees bent as far as told by your health care provider.  Complete this exercise __________ times per day. If this exercise is too easy, try doing it while wearing a backpack with weights in it. Balance exercises These exercises improve or maintain your balance. Balance is important in preventing falls. Exercise E: Single leg stand 1. Without shoes, stand near a railing or in a door frame. Hold on to the railing or door frame as needed. 2. Stand on your __________ foot. Keep your big toe down on the floor and try to keep your arch lifted. 3. Hold this position for __________ seconds. Repeat __________ times. Complete this exercise __________ times per day. If this exercise is too easy, you can try it with your eyes closed or while standing on a pillow. This information is not intended to replace advice given to you by your health care provider. Make sure you discuss any questions you have with your health care provider. Document Released: 07/24/2004 Document Revised: 08/30/2015 Document Reviewed: 08/29/2014 Elsevier Interactive Patient Education  2018 [employer]   

## 2017-08-25 NOTE — Progress Notes (Signed)
Subjective: Nicole Hughes is a 47 y.o. female patient who returns to office for evaluation of Right=Left heel pain. Patient complains of continued pain especially at the Achilles that is throbbing in nature that came back after finishing steroid. Ranks pain 8/10. Patient denies any other pedal complaints.   Patient Active Problem List   Diagnosis Date Noted  . Palpitations 05/25/2017  . Chest wall pain 05/25/2017  . Elevated transaminase level 05/25/2017  . Depression with anxiety 02/23/2017  . 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 Hyponatremia 02/17/2017  . Hypokalemia 09/24/2016  . H/O alcohol abuse 09/22/2016  . H/O acute pancreatitis 09/22/2016  . GERD (gastroesophageal reflux disease) 04/09/2015  . 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 Chest pain 08/20/2012  . Stress reaction 07/23/2012  . Essential hypertension 06/09/2012  . History of pulmonary embolism 06/09/2012  . Preop cardiovascular exam 06/08/2012  . Dysmenorrhea   . Dyspareunia   . Pelvic pain     Current Outpatient Medications on File Prior to Visit  Medication Sig Dispense Refill  . acetaminophen (TYLENOL) 325 MG tablet Take 2 tablets (650 mg total) by mouth every 6 (six) hours as needed. Do not take more than 4000mg  of tylenol per day 30 tablet 0  . amLODipine (NORVASC) 5 MG tablet TAKE 1 TABLET BY MOUTH ONCE DAILY 90 tablet 1  . cyclobenzaprine (FLEXERIL) 10 MG tablet Take 1 tablet (10 mg total) by mouth 3 (three) times daily as needed for muscle spasms. And chest wall pain 90 tablet 0  . ibuprofen (ADVIL,MOTRIN) 400 MG tablet Take 1 tablet (400 mg total) by mouth every 6 (six) hours as needed. 30 tablet 0  . lisinopril (PRINIVIL,ZESTRIL) 10 MG tablet TAKE 1 TABLET BY MOUTH ONCE DAILY 90 tablet 1  . metoprolol succinate (TOPROL XL) 25 MG 24 hr tablet Take 1 tablet (25 mg total) by mouth daily. 90 tablet 3  . MITIGARE 0.6 MG CAPS Take 1 tablet by mouth twice daily for 1 week 14 capsule 0  . Multiple Vitamin  (MULTIVITAMIN WITH MINERALS) TABS tablet Take 1 tablet by mouth daily. 30 tablet 0  . omeprazole (PRILOSEC) 20 MG capsule Take 1 capsule (20 mg total) by mouth daily. 30 capsule 5   No current facility-administered medications on file prior to visit.     No Known Allergies  Objective:  General: Alert and oriented x3 in no acute distress  Dermatology: No open lesions bilateral lower extremities, no webspace macerations, no ecchymosis bilateral, all nails x 10 are well manicured.  Vascular: Dorsalis Pedis and Posterior Tibial pedal pulses 2/4, Capillary Fill Time 3 seconds, + pedal hair growth bilateral, no edema bilateral lower extremities, Temperature gradient within normal limits.  Neurology: Michaell CowingGross sensation intact via light touch bilateral.  Musculoskeletal: Moderate tenderness with palpation at insertion of the Achilles on Right & Left, there is calcaneal exostosis with moderate soft tissue swelling present and decreased ankle rom with knee extending  vs flexed resembling gastroc equnius bilateral, The achilles tendon feels intact with no nodularity or palpable dell, Thompson sign negative.  Gait: Antalgic gait   Assessment and Plan: Problem List Items Addressed This Visit    None    Visit Diagnoses    Achilles tendonitis, bilateral    -  Primary   Relevant Medications   methylPREDNISolone (MEDROL DOSEPAK) 4 MG TBPK tablet   HYDROcodone-acetaminophen (NORCO) 5-325 MG tablet   Inflammatory heel pain, unspecified laterality       Relevant Medications   methylPREDNISolone (MEDROL DOSEPAK)  4 MG TBPK tablet   HYDROcodone-acetaminophen (NORCO) 5-325 MG tablet   Heel pain, bilateral          -Complete examination performed -Re-Discussed treatement options for bilateral tendonitis at achilles -Refilled Medrol dose pak and Norco -Re-dispensed heel lifts and recommend icing daily  -May do gentle stretching and to start with PT for additional treatment modailities -No improvement  will consider EPAT vs Surgery; Patient reports that she does not want surgery because she cares for her dad who is sick and I explained to patient that because her heels are so inflammed that EPAT may worsen her symptoms. Patient at this time opts for PT. -Patient to return to office in 4 weeks for follow up eval after starting PT or sooner if condition worsens.  Asencion Islamitorya Keyler Hoge, DPM

## 2017-08-26 ENCOUNTER — Telehealth: Payer: Self-pay | Admitting: *Deleted

## 2017-08-26 DIAGNOSIS — M7662 Achilles tendinitis, left leg: Secondary | ICD-10-CM

## 2017-08-26 DIAGNOSIS — M7661 Achilles tendinitis, right leg: Secondary | ICD-10-CM

## 2017-08-26 NOTE — Telephone Encounter (Signed)
[facility]

## 2017-08-26 NOTE — Telephone Encounter (Signed)
-----   Message from Asencion Islamitorya Stover, [place] sent at 08/25/2017  5:02 PM EDT ----- Regarding: PT in office with [facility] Bilateral achilles tendonitiis

## 2017-09-01 DIAGNOSIS — M25572 Pain in left ankle and joints of left foot: Secondary | ICD-10-CM | POA: Diagnosis not present

## 2017-09-01 DIAGNOSIS — M25571 Pain in right ankle and joints of right foot: Secondary | ICD-10-CM | POA: Diagnosis not present

## 2017-09-01 DIAGNOSIS — R269 Unspecified abnormalities of gait and mobility: Secondary | ICD-10-CM | POA: Diagnosis not present

## 2017-09-03 DIAGNOSIS — R269 Unspecified abnormalities of gait and mobility: Secondary | ICD-10-CM | POA: Diagnosis not present

## 2017-09-03 DIAGNOSIS — M25571 Pain in right ankle and joints of right foot: Secondary | ICD-10-CM | POA: Diagnosis not present

## 2017-09-03 DIAGNOSIS — M25572 Pain in left ankle and joints of left foot: Secondary | ICD-10-CM | POA: Diagnosis not present

## 2017-09-08 DIAGNOSIS — M25571 Pain in right ankle and joints of right foot: Secondary | ICD-10-CM | POA: Diagnosis not present

## 2017-09-08 DIAGNOSIS — R269 Unspecified abnormalities of gait and mobility: Secondary | ICD-10-CM | POA: Diagnosis not present

## 2017-09-08 DIAGNOSIS — M25572 Pain in left ankle and joints of left foot: Secondary | ICD-10-CM | POA: Diagnosis not present

## 2017-09-11 ENCOUNTER — Telehealth: Payer: Self-pay | Admitting: Sports Medicine

## 2017-09-11 DIAGNOSIS — M25571 Pain in right ankle and joints of right foot: Secondary | ICD-10-CM | POA: Diagnosis not present

## 2017-09-11 DIAGNOSIS — M25572 Pain in left ankle and joints of left foot: Secondary | ICD-10-CM | POA: Diagnosis not present

## 2017-09-11 DIAGNOSIS — M7662 Achilles tendinitis, left leg: Secondary | ICD-10-CM

## 2017-09-11 DIAGNOSIS — R269 Unspecified abnormalities of gait and mobility: Secondary | ICD-10-CM | POA: Diagnosis not present

## 2017-09-11 DIAGNOSIS — M79673 Pain in unspecified foot: Secondary | ICD-10-CM

## 2017-09-11 DIAGNOSIS — M7661 Achilles tendinitis, right leg: Secondary | ICD-10-CM

## 2017-09-11 MED ORDER — METHYLPREDNISOLONE 4 MG PO TBPK: ORAL_TABLET | ORAL | 0 refills | Status: DC

## 2017-09-11 NOTE — Telephone Encounter (Signed)
Patient has requested a refill on Norco 5-325mg  and Medrol Dosepak. Please call her and let her know if she can pick up.

## 2017-09-11 NOTE — Addendum Note (Signed)
Addended by: Alphia Kava D on: 09/11/2017 01:20 PM   Modules accepted: Orders

## 2017-09-11 NOTE — Telephone Encounter (Signed)
I informed pt of Dr. Wynema Birch orders for the medrol dose pack and to use ice and tylenol as directed.

## 2017-09-11 NOTE — Telephone Encounter (Signed)
Val I ok with refilling both the Medrol and Norco for this patient. Thanks Dr. Marylene Land

## 2017-09-11 NOTE — Telephone Encounter (Signed)
MEDROL she will do fine without any issues For her GERD if its a problem take otc anti-acids -Dr. Kathie Rhodes

## 2017-09-11 NOTE — Telephone Encounter (Signed)
I told pt I would check with Dr. Marylene Land, but she was probably too early for the steroid pack, but Dr. Marylene Land may write for the norco and an antiinflammatory. Pt states understanding and she would take anything that would help.

## 2017-09-15 DIAGNOSIS — M25572 Pain in left ankle and joints of left foot: Secondary | ICD-10-CM | POA: Diagnosis not present

## 2017-09-15 DIAGNOSIS — R269 Unspecified abnormalities of gait and mobility: Secondary | ICD-10-CM | POA: Diagnosis not present

## 2017-09-15 DIAGNOSIS — M25571 Pain in right ankle and joints of right foot: Secondary | ICD-10-CM | POA: Diagnosis not present

## 2017-09-17 DIAGNOSIS — M25571 Pain in right ankle and joints of right foot: Secondary | ICD-10-CM | POA: Diagnosis not present

## 2017-09-17 DIAGNOSIS — M25572 Pain in left ankle and joints of left foot: Secondary | ICD-10-CM | POA: Diagnosis not present

## 2017-09-17 DIAGNOSIS — R269 Unspecified abnormalities of gait and mobility: Secondary | ICD-10-CM | POA: Diagnosis not present

## 2017-09-22 DIAGNOSIS — M25572 Pain in left ankle and joints of left foot: Secondary | ICD-10-CM | POA: Diagnosis not present

## 2017-09-22 DIAGNOSIS — R269 Unspecified abnormalities of gait and mobility: Secondary | ICD-10-CM | POA: Diagnosis not present

## 2017-09-22 DIAGNOSIS — Z1231 Encounter for screening mammogram for malignant neoplasm of breast: Secondary | ICD-10-CM | POA: Diagnosis not present

## 2017-09-22 DIAGNOSIS — M25571 Pain in right ankle and joints of right foot: Secondary | ICD-10-CM | POA: Diagnosis not present

## 2017-09-22 DIAGNOSIS — Z01419 Encounter for gynecological examination (general) (routine) without abnormal findings: Secondary | ICD-10-CM | POA: Diagnosis not present

## 2017-09-22 DIAGNOSIS — Z6826 Body mass index (BMI) 26.0-26.9, adult: Secondary | ICD-10-CM | POA: Diagnosis not present

## 2017-09-22 DIAGNOSIS — Z1389 Encounter for screening for other disorder: Secondary | ICD-10-CM | POA: Diagnosis not present

## 2017-09-25 ENCOUNTER — Other Ambulatory Visit: Payer: Self-pay | Admitting: Obstetrics and Gynecology

## 2017-09-25 DIAGNOSIS — N6320 Unspecified lump in the left breast, unspecified quadrant: Secondary | ICD-10-CM

## 2017-09-27 ENCOUNTER — Emergency Department (HOSPITAL_COMMUNITY)
Admission: EM | Admit: 2017-09-27 | Discharge: 2017-09-27 | Disposition: A | Discharge status: Home / Self Care | Payer: 59 | Attending: Emergency Medicine | Admitting: Emergency Medicine

## 2017-09-27 ENCOUNTER — Other Ambulatory Visit: Payer: Self-pay

## 2017-09-27 DIAGNOSIS — I429 Cardiomyopathy, unspecified: Secondary | ICD-10-CM | POA: Insufficient documentation

## 2017-09-27 DIAGNOSIS — I10 Essential (primary) hypertension: Secondary | ICD-10-CM | POA: Diagnosis not present

## 2017-09-27 DIAGNOSIS — Z86718 Personal history of other venous thrombosis and embolism: Secondary | ICD-10-CM | POA: Diagnosis not present

## 2017-09-27 DIAGNOSIS — N644 Mastodynia: Secondary | ICD-10-CM | POA: Diagnosis present

## 2017-09-27 DIAGNOSIS — Z79899 Other long term (current) drug therapy: Secondary | ICD-10-CM | POA: Insufficient documentation

## 2017-09-27 MED ORDER — IBUPROFEN 200 MG PO TABS
600.0000 mg | ORAL_TABLET | Freq: Once | ORAL | Status: AC
  Administered 2017-09-27: 600 mg via ORAL
  Filled 2017-09-27: qty 3

## 2017-09-27 MED ORDER — CYCLOBENZAPRINE HCL 10 MG PO TABS: 10.0000 mg | ORAL_TABLET | Freq: Three times a day (TID) | ORAL | 0 refills | Status: DC | PRN

## 2017-09-27 MED ORDER — CYCLOBENZAPRINE HCL 10 MG PO TABS
10.0000 mg | ORAL_TABLET | Freq: Once | ORAL | Status: AC
  Administered 2017-09-27: 10 mg via ORAL
  Filled 2017-09-27: qty 1

## 2017-09-27 NOTE — ED Provider Notes (Signed)
[facility]-EMERGENCY DEPT Provider Note   CSN: [id number] Arrival date & time: 09/27/17  [id number]     History   Chief Complaint Chief Complaint  Patient presents with  . Breast Mass    HPI Nicole Hughes is a 47 y.o. female.  HPI  47 year old female presents with left breast pain and chest pain.  Has been ongoing for months.  A week or so ago she had a mammogram that showed a breast mass.  She is due for an ultrasound soon.  The patient has become anxious because of this and ran out of her Flexeril.  She states ever since then the pain is even worse than it typically is.  This is been a chronic, daily pain for at least 4 months.  It is not new or different besides increased pain.  She is a little nauseated currently but denies shortness of breath or abdominal pain.  It is not pleuritic.  She does not take any other medicines for pain such as ibuprofen or Tylenol because they do not work. Pain is rated as a 10.  Past Medical History:  Diagnosis Date  . Anxiety   . Cardiomyopathy (HCC)   . Chronic lower back pain   . Complication of anesthesia    "hard to get under"  . Cyst of ovary   . Depression    hx  . DVT complicating pregnancy 08/1998   RLE; RUE  . Dysmenorrhea   . Dyspareunia   . 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 GERD (gastroesophageal reflux disease)   . Hay fever    "fall and spring" (02/17/2017)  . Headache    "2-3 times/.wik" (02/17/2017)  . History of chicken pox   . Hypertension   . Migraine    "monthly" (02/17/2017)  . Pelvic pain   . Pulmonary embolism (HCC) 08/1998   S/P childbirth    Patient Active Problem List   Diagnosis Date Noted  . Palpitations 05/25/2017  . Chest wall pain 05/25/2017  . Elevated transaminase level 05/25/2017  . Depression with anxiety 02/23/2017  . 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 Hyponatremia 02/17/2017  . Hypokalemia 09/24/2016  . H/O alcohol abuse 09/22/2016  . H/O acute pancreatitis  09/22/2016  . GERD (gastroesophageal reflux disease) 04/09/2015  . 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 Chest pain 08/20/2012  . Stress reaction 07/23/2012  . Essential hypertension 06/09/2012  . History of pulmonary embolism 06/09/2012  . Preop cardiovascular exam 06/08/2012  . Dysmenorrhea   . Dyspareunia   . Pelvic pain     Past Surgical History:  Procedure Laterality Date  . CESAREAN SECTION  1994  . ECTOPIC PREGNANCY SURGERY  2010  . LAPAROSCOPY N/A 09/24/2012   Procedure: OPERATIVE LAPROSCOPY WITH LYSIS OF ADHESIONS;  Surgeon: Sherron MondayJody [name], MD;  Location: [facility];  Service: Gynecology;  Laterality: N/A;  . TONSILLECTOMY  1984  . TUBAL LIGATION  2010     OB History   None      Home Medications    Prior to Admission medications   Medication Sig Start Date End Date Taking? Authorizing Provider  acetaminophen (TYLENOL) 325 MG tablet Take 2 tablets (650 mg total) by mouth every 6 (six) hours as needed. Do not take more than 4000mg  of tylenol per day 05/24/17   Couture, Cortni S, PA-C  amLODipine (NORVASC) 5 MG tablet TAKE 1 TABLET BY MOUTH ONCE DAILY 08/13/17   Tower, Audrie GallusMarne A, MD  cyclobenzaprine (FLEXERIL) 10 MG tablet Take  1 tablet (10 mg total) by mouth 3 (three) times daily as needed for muscle spasms. And chest wall pain 09/27/17   Pricilla Loveless, MD  HYDROcodone-acetaminophen (NORCO) 5-325 MG tablet Take 1 tablet by mouth every 6 (six) hours as needed for moderate pain. 08/25/17   Asencion Islam, DPM  ibuprofen (ADVIL,MOTRIN) 400 MG tablet Take 1 tablet (400 mg total) by mouth every 6 (six) hours as needed. 05/24/17   Couture, Cortni S, PA-C  lisinopril (PRINIVIL,ZESTRIL) 10 MG tablet TAKE 1 TABLET BY MOUTH ONCE DAILY 08/13/17   Tower, Audrie Gallus, MD  methylPREDNISolone (MEDROL DOSEPAK) 4 MG TBPK tablet Take as directed 09/11/17   Asencion Islam, DPM  metoprolol succinate (TOPROL XL) 25 MG 24 hr tablet Take 1 tablet (25 mg total) by mouth daily. 06/22/17   Lyn Records,  MD  MITIGARE 0.6 MG CAPS Take 1 tablet by mouth twice daily for 1 week 06/23/17   Lyn Records, MD  Multiple Vitamin (MULTIVITAMIN WITH MINERALS) TABS tablet Take 1 tablet by mouth daily. 02/19/17   Barnetta Chapel, MD  omeprazole (PRILOSEC) 20 MG capsule Take 1 capsule (20 mg total) by mouth daily. 02/23/17   Tower, Audrie Gallus, MD    Family History Family History  Problem Relation Age of Onset  . Cancer - Lung Mother   . Heart failure Father   . Hypertension Father     Social History Social History   Tobacco Use  . Smoking status: Never Smoker  . Smokeless tobacco: Never Used  Substance Use Topics  . Alcohol use: Not Currently    Alcohol/week: 5.0 standard drinks    Types: 5 Cans of beer per week    Frequency: Never    Comment: has quit (March 2019)  . Drug use: No     Allergies   Patient has no known allergies.   Review of Systems Review of Systems  Constitutional: Negative for fever.  Respiratory: Negative for shortness of breath.   Cardiovascular: Positive for chest pain.  Gastrointestinal: Positive for nausea. Negative for abdominal pain and vomiting.  All other systems reviewed and are negative.    Physical Exam Updated Vital Signs BP (!) 186/117   Pulse 94   Temp 98.1 F (36.7 C) (Oral)   Resp 16   Ht 5\' 5"  (1.651 m)   Wt 71.2 kg   LMP 09/13/2017   SpO2 99%   BMI 26.13 kg/m   Physical Exam  Constitutional: She is oriented to person, place, and time. She appears well-developed and well-nourished.  HENT:  Head: Normocephalic and atraumatic.  Right Ear: External ear normal.  Left Ear: External ear normal.  Nose: Nose normal.  Eyes: Right eye exhibits no discharge. Left eye exhibits no discharge.  Cardiovascular: Normal rate, regular rhythm and normal heart sounds.  Pulmonary/Chest: Effort normal and breath sounds normal. She exhibits tenderness. Right breast exhibits no tenderness. Left breast exhibits tenderness. Left breast exhibits no mass.    No obvious breast mass or skin changes. Diffuse left breast tenderness    Abdominal: Soft. There is no tenderness.  Neurological: She is alert and oriented to person, place, and time.  Skin: Skin is warm and dry.  Nursing note and vitals reviewed.    ED Treatments / Results  Labs (all labs ordered are listed, but only abnormal results are displayed) Labs Reviewed - No data to display  EKG None  Radiology No results found.  Procedures Procedures (including critical care time)  Medications Ordered in  ED Medications  ibuprofen (ADVIL,MOTRIN) tablet 600 mg (has no administration in time range)  cyclobenzaprine (FLEXERIL) tablet 10 mg (has no administration in time range)     Initial Impression / Assessment and Plan / ED Course  I have reviewed the triage vital signs and the nursing notes.  Pertinent labs & imaging results that were available during my care of the patient were reviewed by me and considered in my medical decision making (see chart for details).     The patient states she only wants refill of her Flexeril and to go home and follow-up with her PCP.  I discussed that while this is a chronic pain, the pain in her chest with her hypertension is concerning and discussed possible testing including chest x-ray, ECG and/or labs.  She declines all these.  She understands that I am unable to rule out things such as MI without these tests and she states that she is certain it is the breast pain and wants to go home.  I think it is reasonable for her to go home but we did discuss limited testing and strict return precautions.  I will refill the Flexeril.  Final Clinical Impressions(s) / ED Diagnoses   Final diagnoses:  Breast pain, left    ED Discharge Orders         Ordered    cyclobenzaprine (FLEXERIL) 10 MG tablet  3 times daily PRN     09/27/17 1047           Pricilla Loveless, MD 09/27/17 1051

## 2017-09-27 NOTE — Discharge Instructions (Addendum)
If your pain worsens or he develop shortness of breath, vomiting, fever, or any other new/concerning symptoms and return to the ER for evaluation.  Otherwise follow-up with your primary care physician.

## 2017-09-27 NOTE — ED Triage Notes (Addendum)
Pt c/o L breast pain since April. Pt state she has seen PMD and cardiologist and recently dx with L breast mass. Pt states she ran out of pain meds and pain has returned, pt awaiting repeat US. Pt tearful and anxious in triage. States she is scared and not sleeping.

## 2017-09-28 ENCOUNTER — Telehealth: Payer: Self-pay

## 2017-09-28 NOTE — Telephone Encounter (Signed)
Per chart review tab pt seen [facility] on 09/27/17.

## 2017-09-28 NOTE — Telephone Encounter (Signed)
PLEASE NOTE: All timestamps contained within this report are represented as [place] Standard Time. CONFIDENTIALTY NOTICE: This fax transmission is intended only for the addressee. It contains information that is legally privileged, confidential or otherwise protected from use or disclosure. If you are not the intended recipient, you are strictly prohibited from reviewing, disclosing, copying using or disseminating any of this information or taking any action in reliance on or regarding this information. If you have received this fax in error, please notify us immediately by telephone so that we can arrange for its return to us. Phone: [phone], Toll-Free: [phone], Fax: [phone] Page: 1 of 2 Call Id: [id number] [facility] - Client TELEPHONE ADVICE RECORD [facility] Patient Name: Nicole Hughes Gender: Female DOB: 05/10/1970 Age: 47 Y 5 M 27 D Return Phone Number: [phone] (Primary) Address: City/State/Zip: [place] [id number] Client [facility] Client [facility] Site [facility] - Night Physician Tower, Idamae SchullerMarne - MD Contact Type Call Who Is Calling Patient / Member / Family / Caregiver Call Type Triage / Clinical Relationship To Patient Self Return Phone Number [phone] (Primary) Chief Complaint CHEST PAIN (>=21 years) - pain, pressure, heaviness or tightness Reason for Call Symptomatic / Request for Health Information Initial Comment Caller states that she has pain in the left breast since April. She is wondering if she needs to go to the hospital. She states that she has a mass. She is having chest pain and pressure. Translation No Nurse Assessment Nurse: Scarlette ArStandifer, RN, Heather Date/Time (Eastern Time): 09/27/2017 7:55:04 AM Confirm and document reason for call. If symptomatic, describe symptoms. ---Caller states that she has pain in the left breast since April. She is  wondering if she needs to go to the hospital. She states that she has a mass in her left breast. She is having chest pain and pressure. Does the patient have any new or worsening symptoms? ---Yes Will a triage be completed? ---Yes Related visit to physician within the last 2 weeks? ---No Does the PT have any chronic conditions? (i.e. diabetes, asthma, this includes High risk factors for pregnancy, etc.) ---Yes List chronic conditions. ---HTN, heartburn, Is the patient pregnant or possibly pregnant? (Ask all females between the ages of 2312-55) ---No Is this a behavioral health or substance abuse call? ---No Guidelines Guideline Title Affirmed Question Affirmed Notes Nurse Date/Time (Eastern Time) Chest Pain [1] Chest pain lasts > 5 minutes AND [2] described as crushing, pressure-like, or heavy Standifer, RN, Heather 09/27/2017 7:55:35 AM Disp. Time Lamount Cohen(Eastern Time) Disposition Final User 09/27/2017 7:51:50 AM Send to Urgent Trixie DredgeQueue Goley, Shanice PLEASE NOTE: All timestamps contained within this report are represented as [place] Standard Time. CONFIDENTIALTY NOTICE: This fax transmission is intended only for the addressee. It contains information that is legally privileged, confidential or otherwise protected from use or disclosure. If you are not the intended recipient, you are strictly prohibited from reviewing, disclosing, copying using or disseminating any of this information or taking any action in reliance on or regarding this information. If you have received this fax in error, please notify us immediately by telephone so that we can arrange for its return to us. Phone: [phone], Toll-Free: [phone], Fax: [phone] Page: 2 of 2 Call Id: [id number] Disp. Time Lamount Cohen(Eastern Time) Disposition Final User 09/27/2017 8:01:17 AM 911 Outcome Documentation Standifer, RN, Herbert SetaHeather Reason: Caller states that she will go to the ED, but she will have her father take her. 09/27/2017 7:59:15 AM  Call EMS 911 Now Yes Standifer, RN, Herbert Seta Caller Disagree/Comply Disagree Caller Understands Yes PreDisposition Call Doctor Care Advice Given Per Guideline CALL EMS 911 NOW: * Immediate medical attention is needed. You need to hang up and call 911 (or an ambulance). CARE ADVICE given per Chest Pain (Adult) guideline. Referrals GO TO FACILITY UNDECIDED

## 2017-09-29 ENCOUNTER — Encounter: Payer: Self-pay | Admitting: Sports Medicine

## 2017-09-29 ENCOUNTER — Ambulatory Visit: Payer: 59 | Admitting: Sports Medicine

## 2017-09-29 DIAGNOSIS — M79673 Pain in unspecified foot: Secondary | ICD-10-CM | POA: Diagnosis not present

## 2017-09-29 DIAGNOSIS — M7661 Achilles tendinitis, right leg: Secondary | ICD-10-CM

## 2017-09-29 DIAGNOSIS — R269 Unspecified abnormalities of gait and mobility: Secondary | ICD-10-CM | POA: Diagnosis not present

## 2017-09-29 DIAGNOSIS — R87619 Unspecified abnormal cytological findings in specimens from cervix uteri: Secondary | ICD-10-CM | POA: Insufficient documentation

## 2017-09-29 DIAGNOSIS — M79671 Pain in right foot: Secondary | ICD-10-CM

## 2017-09-29 DIAGNOSIS — M7662 Achilles tendinitis, left leg: Secondary | ICD-10-CM

## 2017-09-29 DIAGNOSIS — M25572 Pain in left ankle and joints of left foot: Secondary | ICD-10-CM | POA: Diagnosis not present

## 2017-09-29 DIAGNOSIS — M25571 Pain in right ankle and joints of right foot: Secondary | ICD-10-CM | POA: Diagnosis not present

## 2017-09-29 DIAGNOSIS — R52 Pain, unspecified: Secondary | ICD-10-CM | POA: Insufficient documentation

## 2017-09-29 DIAGNOSIS — M79672 Pain in left foot: Secondary | ICD-10-CM

## 2017-09-29 DIAGNOSIS — G4700 Insomnia, unspecified: Secondary | ICD-10-CM | POA: Insufficient documentation

## 2017-09-29 MED ORDER — HYDROCODONE-ACETAMINOPHEN 5-325 MG PO TABS: 1.0000 | ORAL_TABLET | Freq: Four times a day (QID) | ORAL | 0 refills | Status: DC | PRN

## 2017-09-29 MED ORDER — METHYLPREDNISOLONE 4 MG PO TBPK: ORAL_TABLET | ORAL | 0 refills | Status: DC

## 2017-09-29 NOTE — Progress Notes (Signed)
Subjective: Nicole Hughes is a 47 y.o. female patient who returns to office for evaluation of Right>Left heel pain. Patient complains of continued pain especially at the Achilles that is throbbing in nature on left 2/10 whereas the right is worse still even with PT. Ranks pain 7/10 on right. Patient denies any other pedal complaints.   Patient Active Problem List   Diagnosis Date Noted  . Abnormal cervical Papanicolaou smear 09/29/2017  . Insomnia 09/29/2017  . Pain 09/29/2017  . Palpitations 05/25/2017  . Chest wall pain 05/25/2017  . Elevated transaminase level 05/25/2017  . Depression with anxiety 02/23/2017  . 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 Hyponatremia 02/17/2017  . Hypokalemia 09/24/2016  . H/O alcohol abuse 09/22/2016  . H/O acute pancreatitis 09/22/2016  . GERD (gastroesophageal reflux disease) 04/09/2015  . 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 Chest pain 08/20/2012  . Stress reaction 07/23/2012  . Essential hypertension 06/09/2012  . History of pulmonary embolism 06/09/2012  . Preop cardiovascular exam 06/08/2012  . Dysmenorrhea   . Dyspareunia   . Pelvic pain     Current Outpatient Medications on File Prior to Visit  Medication Sig Dispense Refill  . acetaminophen (TYLENOL) 325 MG tablet Take 2 tablets (650 mg total) by mouth every 6 (six) hours as needed. Do not take more than 4000mg  of tylenol per day (Patient not taking: Reported on 09/29/2017) 30 tablet 0  . amLODipine (NORVASC) 5 MG tablet TAKE 1 TABLET BY MOUTH ONCE DAILY 90 tablet 1  . cyclobenzaprine (FLEXERIL) 10 MG tablet Take 1 tablet (10 mg total) by mouth 3 (three) times daily as needed for muscle spasms. And chest wall pain 90 tablet 0  . dicyclomine (BENTYL) 20 MG tablet dicyclomine 20 mg tablet    . Elagolix Sodium (ORILISSA) 150 MG TABS Orilissa 150 mg tablet  Take 1 tablet(s) every day by oral route.    . hydrochlorothiazide (HYDRODIURIL) 25 MG tablet hydrochlorothiazide 25 mg tablet    .  ibuprofen (ADVIL,MOTRIN) 400 MG tablet Take 1 tablet (400 mg total) by mouth every 6 (six) hours as needed. 30 tablet 0  . lisinopril (PRINIVIL,ZESTRIL) 10 MG tablet TAKE 1 TABLET BY MOUTH ONCE DAILY 90 tablet 1  . methocarbamol (ROBAXIN) 500 MG tablet methocarbamol 500 mg tablet    . metoprolol succinate (TOPROL XL) 25 MG 24 hr tablet Take 1 tablet (25 mg total) by mouth daily. 90 tablet 3  . MITIGARE 0.6 MG CAPS Take 1 tablet by mouth twice daily for 1 week 14 capsule 0  . Multiple Vitamin (MULTIVITAMIN WITH MINERALS) TABS tablet Take 1 tablet by mouth daily. 30 tablet 0  . naproxen (NAPROSYN) 500 MG tablet naproxen 500 mg tablet    . omeprazole (PRILOSEC) 20 MG capsule Take 1 capsule (20 mg total) by mouth daily. 30 capsule 5  . oxycodone (OXY-IR) 5 MG capsule oxycodone 5 mg capsule  Take 1-2 capsule(s) EVERY 6 HOURS by oral route.    . sertraline (ZOLOFT) 50 MG tablet sertraline 50 mg tablet    . traMADol (ULTRAM) 50 MG tablet tramadol 50 mg tablet    . zolpidem (AMBIEN) 10 MG tablet zolpidem 10 mg tablet     No current facility-administered medications on file prior to visit.     No Known Allergies  Objective:  General: Alert and oriented x3 in no acute distress  Dermatology: No open lesions bilateral lower extremities, no webspace macerations, no ecchymosis bilateral, all nails x 10 are well manicured.  Vascular:  Dorsalis Pedis and Posterior Tibial pedal pulses 2/4, Capillary Fill Time 3 seconds, + pedal hair growth bilateral, no edema bilateral lower extremities, Temperature gradient within normal limits.  Neurology: Michaell Cowing sensation intact via light touch bilateral.  Musculoskeletal: Moderate tenderness with palpation at insertion of the Achilles on Right> mild on Left, there is calcaneal exostosis with moderate soft tissue swelling present and decreased ankle rom with knee extending  vs flexed resembling gastroc equnius bilateral, The achilles tendon feels intact with no  nodularity or palpable dell, Thompson sign negative. Subjective pain sometimes to ankle on right.   Gait: Antalgic gait   Assessment and Plan: Problem List Items Addressed This Visit    None    Visit Diagnoses    Achilles tendonitis, bilateral    -  Primary   Relevant Medications   methylPREDNISolone (MEDROL DOSEPAK) 4 MG TBPK tablet   HYDROcodone-acetaminophen (NORCO) 5-325 MG tablet   Inflammatory heel pain, unspecified laterality       Relevant Medications   methylPREDNISolone (MEDROL DOSEPAK) 4 MG TBPK tablet   HYDROcodone-acetaminophen (NORCO) 5-325 MG tablet   Heel pain, bilateral         -Complete examination performed -Re-Discussed treatement options for bilateral tendonitis at achilles -Refilled Medrol dose pak and Norco for one last time -Continue with heel lifts and recommend icing daily  -Continue with gentle stretching and to start with PT for additional treatment modailities -No improvement will consider EPAT vs Surgery as previous  -Patient to return to office in 4 weeks for follow up evaluation sooner if condition worsens.  Asencion Islam, DPM

## 2017-10-01 ENCOUNTER — Other Ambulatory Visit: Payer: Self-pay | Admitting: Obstetrics and Gynecology

## 2017-10-01 ENCOUNTER — Ambulatory Visit
Admission: RE | Admit: 2017-10-01 | Discharge: 2017-10-01 | Disposition: A | Discharge status: Home / Self Care | Payer: 59 | Source: Ambulatory Visit | Attending: Obstetrics and Gynecology | Admitting: Obstetrics and Gynecology

## 2017-10-01 DIAGNOSIS — N6012 Diffuse cystic mastopathy of left breast: Secondary | ICD-10-CM | POA: Diagnosis not present

## 2017-10-01 DIAGNOSIS — N6002 Solitary cyst of left breast: Secondary | ICD-10-CM

## 2017-10-01 DIAGNOSIS — N6320 Unspecified lump in the left breast, unspecified quadrant: Secondary | ICD-10-CM

## 2017-10-01 DIAGNOSIS — R928 Other abnormal and inconclusive findings on diagnostic imaging of breast: Secondary | ICD-10-CM | POA: Diagnosis not present

## 2017-10-02 DIAGNOSIS — R269 Unspecified abnormalities of gait and mobility: Secondary | ICD-10-CM | POA: Diagnosis not present

## 2017-10-02 DIAGNOSIS — M25571 Pain in right ankle and joints of right foot: Secondary | ICD-10-CM | POA: Diagnosis not present

## 2017-10-02 DIAGNOSIS — M25572 Pain in left ankle and joints of left foot: Secondary | ICD-10-CM | POA: Diagnosis not present

## 2017-10-05 ENCOUNTER — Ambulatory Visit: Payer: Self-pay | Admitting: Family Medicine

## 2017-10-05 NOTE — Telephone Encounter (Signed)
Pt c/o left chest pain without radiation. Pt sounds anxious on the phone. Pt stated that she has been having chest pain for several months.Pt was seen in the ED 05/23/17. Pt was prescribed Ibuprofen, warm compresses and Flexeril. Pt stated the left chest pain is severe and pt c/o pain with deep breaths. Pt stated that nothing is making the pain better. Pt stated that bending over makes the chest pain worse.  Pt has h/o post partum cardiomyopathy and h/o pulmonary embolism. Pt sounded short of breath over the phone and strained voice. Pt stated it is because she is unable to deep breathe in or out without pain. Pt stated pain is constant and nothing makes it better Pt has seen cardiologist for this in the past. Pt complains of nausea at the end of the day due to the pain. Pt advised to go to [facility] ED. Pt wants to go to [facility]  Reason for Disposition . SEVERE chest pain  Answer Assessment - Initial Assessment Questions 1. LOCATION: "Where does it hurt?"       Left chest pain 2. RADIATION: "Does the pain go anywhere else?" (e.g., into neck, jaw, arms, back)     no 3. ONSET: "When did the chest pain begin?" (Minutes, hours or days)      Several months has worsened in the past week and half 4. PATTERN "Does the pain come and go, or has it been constant since it started?"  "Does it get worse with exertion?"      constant 5. DURATION: "How long does it last" (e.g., seconds, minutes, hours)     consstant 6. SEVERITY: "How bad is the pain?"  (e.g., Scale 1-10; mild, moderate, or severe)    - MILD (1-3): doesn't interfere with normal activities     - MODERATE (4-7): interferes with normal activities or awakens from sleep    - SEVERE (8-10): excruciating pain, unable to do any normal activities       Severe feels worse when bending over.-nothing makes it feel batter 7. CARDIAC RISK FACTORS: "Do you have any history of heart problems or risk factors for heart disease?" (e.g., prior heart attack,  angina; high blood pressure, diabetes, being overweight, high cholesterol, smoking, or strong family history of heart disease)     High blood pressure, h/o post partum cardiopathy 8. PULMONARY RISK FACTORS: "Do you have any history of lung disease?"  (e.g., blood clots in lung, asthma, emphysema, birth control pills)     2 blood clots in lung 9. CAUSE: "What do you think is causing the chest pain?"     Pt doesn't know  10. OTHER SYMPTOMS: "Do you have any other symptoms?" (e.g., dizziness, nausea, vomiting, sweating, fever, difficulty breathing, cough)       Nausea torward end of day,hurts when breaths in or out 11. PREGNANCY: "Is there any chance you are pregnant?" "When was your last menstrual period?"       N/a LMP: 2 weeks ago  Protocols used: CHEST PAIN-A-AH

## 2017-10-05 NOTE — Telephone Encounter (Signed)
I will watch for ED notes in epic

## 2017-10-05 NOTE — Telephone Encounter (Signed)
I spoke with pt; pt went to cardiology before but pt continues with pain; this morning CP is 9 and hurts to breathe. Pt will go to [facility] ED. FYI to Dr Milinda Antis.

## 2017-10-09 DIAGNOSIS — M25571 Pain in right ankle and joints of right foot: Secondary | ICD-10-CM | POA: Diagnosis not present

## 2017-10-09 DIAGNOSIS — R269 Unspecified abnormalities of gait and mobility: Secondary | ICD-10-CM | POA: Diagnosis not present

## 2017-10-09 DIAGNOSIS — M25572 Pain in left ankle and joints of left foot: Secondary | ICD-10-CM | POA: Diagnosis not present

## 2017-10-13 ENCOUNTER — Ambulatory Visit: Payer: Self-pay | Admitting: Family Medicine

## 2017-10-13 ENCOUNTER — Emergency Department (HOSPITAL_COMMUNITY)
Admission: EM | Admit: 2017-10-13 | Discharge: 2017-10-13 | Disposition: A | Discharge status: Home / Self Care | Payer: 59 | Attending: Emergency Medicine | Admitting: Emergency Medicine

## 2017-10-13 ENCOUNTER — Other Ambulatory Visit: Payer: Self-pay

## 2017-10-13 ENCOUNTER — Emergency Department (HOSPITAL_COMMUNITY): Payer: 59

## 2017-10-13 ENCOUNTER — Encounter (HOSPITAL_COMMUNITY): Payer: Self-pay | Admitting: *Deleted

## 2017-10-13 DIAGNOSIS — R079 Chest pain, unspecified: Secondary | ICD-10-CM | POA: Diagnosis not present

## 2017-10-13 DIAGNOSIS — R000 Tachycardia, unspecified: Secondary | ICD-10-CM | POA: Diagnosis not present

## 2017-10-13 DIAGNOSIS — I10 Essential (primary) hypertension: Secondary | ICD-10-CM | POA: Insufficient documentation

## 2017-10-13 DIAGNOSIS — Z79899 Other long term (current) drug therapy: Secondary | ICD-10-CM | POA: Diagnosis not present

## 2017-10-13 LAB — BASIC METABOLIC PANEL
Anion gap: 16 — ABNORMAL HIGH (ref 5–15)
BUN: 12 mg/dL (ref 6–20)
CO2: 19 mmol/L — ABNORMAL LOW (ref 22–32)
Calcium: 9.3 mg/dL (ref 8.9–10.3)
Chloride: 105 mmol/L (ref 98–111)
Creatinine, Ser: 0.76 mg/dL (ref 0.44–1.00)
GFR calc Af Amer: >60 mL/min (ref >60)
GFR calc non Af Amer: >60 mL/min (ref >60)
Glucose, Bld: 106 mg/dL — ABNORMAL HIGH (ref 70–99)
Potassium: 3.4 mmol/L — ABNORMAL LOW (ref 3.5–5.1)
Sodium: 140 mmol/L (ref 135–145)

## 2017-10-13 LAB — I-STAT BETA HCG BLOOD, ED (MC, WL, AP ONLY): I-stat hCG, quantitative: <5 m[IU]/mL (ref <5)

## 2017-10-13 LAB — CBC
HCT: 37.9 % (ref 36.0–46.0)
Hemoglobin: 12.6 g/dL (ref 12.0–15.0)
MCH: 29.4 pg (ref 26.0–34.0)
MCHC: 33.2 g/dL (ref 30.0–36.0)
MCV: 88.3 fL (ref 80.0–100.0)
Platelets: 247 10*3/uL (ref 150–400)
RBC: 4.29 MIL/uL (ref 3.87–5.11)
RDW: 14 % (ref 11.5–15.5)
WBC: 8.9 10*3/uL (ref 4.0–10.5)
nRBC: 0 % (ref 0.0–0.2)

## 2017-10-13 LAB — I-STAT TROPONIN, ED: Troponin i, poc: 0 ng/mL (ref 0.00–0.08)

## 2017-10-13 MED ORDER — KETOROLAC TROMETHAMINE 15 MG/ML IJ SOLN
15.0000 mg | Freq: Once | INTRAMUSCULAR | Status: AC
  Administered 2017-10-13: 15 mg via INTRAVENOUS
  Filled 2017-10-13: qty 1

## 2017-10-13 MED ORDER — MORPHINE SULFATE (PF) 4 MG/ML IV SOLN
4.0000 mg | Freq: Once | INTRAVENOUS | Status: AC
  Administered 2017-10-13: 4 mg via INTRAVENOUS
  Filled 2017-10-13: qty 1

## 2017-10-13 MED ORDER — SODIUM CHLORIDE 0.9 % IV BOLUS
1000.0000 mL | Freq: Once | INTRAVENOUS | Status: AC
  Administered 2017-10-13: 1000 mL via INTRAVENOUS

## 2017-10-13 MED ORDER — LORAZEPAM 2 MG/ML IJ SOLN
1.0000 mg | Freq: Once | INTRAMUSCULAR | Status: AC
  Administered 2017-10-13: 1 mg via INTRAVENOUS
  Filled 2017-10-13: qty 1

## 2017-10-13 MED ORDER — SODIUM CHLORIDE 0.9 % IJ SOLN
INTRAMUSCULAR | Status: AC
  Filled 2017-10-13: qty 50

## 2017-10-13 MED ORDER — IOPAMIDOL (ISOVUE-370) INJECTION 76%
INTRAVENOUS | Status: AC
  Filled 2017-10-13: qty 100

## 2017-10-13 MED ORDER — HYDROXYZINE HCL 25 MG PO TABS: 25.0000 mg | ORAL_TABLET | Freq: Three times a day (TID) | ORAL | 0 refills | Status: DC | PRN

## 2017-10-13 MED ORDER — IOPAMIDOL (ISOVUE-370) INJECTION 76%
100.0000 mL | Freq: Once | INTRAVENOUS | Status: AC | PRN
  Administered 2017-10-13: 100 mL via INTRAVENOUS

## 2017-10-13 NOTE — Telephone Encounter (Signed)
Pt called in c/o "severe chest pain over my left breast that is spreading into the right side".  She is breathing hard but she denies being short of breath.   "I'm just in so much pain".   She has seen Dr. Milinda Antis for chest pain back in April and was prescribed a muscle relaxer because thought it was a strained muscle however the muscle relaxer does not help at all.    She has seen a cardiologist and had a stress test.   Everything came out fine and she was cleared by the cardiologist. She had a mammogram done which revealed she has fibrocystic breast disease. She is scared and "I don't know what to do I'm hurting so bad in my chest".   "No one seems to know what is going on".   "I've been to the emergency room and all and they can't find anything wrong either".   "I hate to go back".     I let her know with the severe chest pain she is experiencing she needs to go back to the ED.    We don't have the technology in the office to diagnose what is going on plus she needs pain medication. When I asked her if she had a history of blood clots of any kind she mentioned she had 2 pulmonary emboli with her second child.  She mentioned,  "This feels different from that". I let her know she needed to go to the ED.   She does not have a car so I instructed her to call 911 which she agreed to do.   She is going to have EMS take her to [facility] ED.  I routed a note to Dr. Royden Purl nurse pool so she would be aware of the referral to the ED. Reason for Disposition . [1] Chest pain lasts > 5 minutes AND [2] described as crushing, pressure-like, or heavy  Answer Assessment - Initial Assessment Questions 1. LOCATION: "Where does it hurt?"       I've seen Dr. Milinda Antis since April for this chest pain.    Muscle relaxer not work.   Saw cardiologist and everything ok.   Now I've had a mammogram.   It had a mass.   I had another mammogram.   It was a fibrocyst.  Pain above left breast and is spreading to the right  side that started during the last 2 weeks. 2. RADIATION: "Does the pain go anywhere else?" (e.g., into neck, jaw, arms, back)     I'm having difficulty sleeping and eating over the past month.  It just hurts really bad.   She is breathing hard and is having headaches.   The muscle relaxers don't help at all. 3. ONSET: "When did the chest pain begin?" (Minutes, hours or days)      It started about 3:30 am.   The pain woke me up. 4. PATTERN "Does the pain come and go, or has it been constant since it started?"  "Does it get worse with exertion?"      Pain is constant. 5. DURATION: "How long does it last" (e.g., seconds, minutes, hours)     It's constant.   I'm scarred.    6. SEVERITY: "How bad is the pain?"  (e.g., Scale 1-10; mild, moderate, or severe)    - MILD (1-3): doesn't interfere with normal activities     - MODERATE (4-7): interferes with normal activities or awakens from sleep    -  SEVERE (8-10): excruciating pain, unable to do any normal activities       10 7. CARDIAC RISK FACTORS: "Do you have any history of heart problems or risk factors for heart disease?" (e.g., prior heart attack, angina; high blood pressure, diabetes, being overweight, high cholesterol, smoking, or strong family history of heart disease)     Did stress test was normal.    I have hypertension which is controlled on medication.  Using Prilosec not helping. 8. PULMONARY RISK FACTORS: "Do you have any history of lung disease?"  (e.g., blood clots in lung, asthma, emphysema, birth control pills)     I had a blood clot 2 in my lungs in 2000 with my 2nd child.     9. CAUSE: "What do you think is causing the chest pain?"     When I lay down with the blood I could not breath.   This does not feel the same as that.   Breathing hard from pain not because short of breath.  10. OTHER SYMPTOMS: "Do you have any other symptoms?" (e.g., dizziness, nausea, vomiting, sweating, fever, difficulty breathing, cough)       Not dizzy.    I'm clammy feeling at times.   When pain is intense I'm nauseas.    No coughing. 11. PREGNANCY: "Is there any chance you are pregnant?" "When was your last menstrual period?"       No   Had Tubal ligation.  Protocols used: CHEST PAIN-A-AH

## 2017-10-13 NOTE — ED Notes (Signed)
She returns from CT at this time. She tells me she feels "better".

## 2017-10-13 NOTE — ED Triage Notes (Signed)
Pt presents with c/o chest pain  Since April, Has been seen many times for same, seen by Cardiology, today wants every test done to find cause.pt is tachy upon arriving to room, HR 136. She is anxious on presentation.

## 2017-10-13 NOTE — Telephone Encounter (Signed)
At her last ED visit she refused CXR or EKG or labs (of note) I will watch for notes

## 2017-10-13 NOTE — ED Provider Notes (Signed)
[facility]-EMERGENCY DEPT Provider Note   CSN: [id number] Arrival date & time: 10/13/17  1217     History   Chief Complaint Chief Complaint  Patient presents with  . Chest Pain    HPI Nicole Hughes is a 47 y.o. female.  HPI  47 year old female with chest pain.  She has been having this pain intermittently for months.  Prior evaluations including cardiology and stress testing.  Negative per her report.  She continues to have this pain and is very worried about it.  Describes the pain is sometimes sharp and sometimes a deep ache.  No appreciable exacerbating factors.  No fevers or chills.  No cough.  No unusual leg pain or swelling. Hx of DVT/PE related to pregnancy.   Past Medical History:  Diagnosis Date  . Anxiety   . Cardiomyopathy (HCC)   . Chronic lower back pain   . Complication of anesthesia    "hard to get under"  . Cyst of ovary   . Depression    hx  . DVT complicating pregnancy 08/1998   RLE; RUE  . Dysmenorrhea   . Dyspareunia   . 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 GERD (gastroesophageal reflux disease)   . Hay fever    "fall and spring" (02/17/2017)  . Headache    "2-3 times/.wik" (02/17/2017)  . History of chicken pox   . Hypertension   . Migraine    "monthly" (02/17/2017)  . Pelvic pain   . Pulmonary embolism (HCC) 08/1998   S/P childbirth    Patient Active Problem List   Diagnosis Date Noted  . Abnormal cervical Papanicolaou smear 09/29/2017  . Insomnia 09/29/2017  . Pain 09/29/2017  . Palpitations 05/25/2017  . Chest wall pain 05/25/2017  . Elevated transaminase level 05/25/2017  . Depression with anxiety 02/23/2017  . 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 Hyponatremia 02/17/2017  . Hypokalemia 09/24/2016  . H/O alcohol abuse 09/22/2016  . H/O acute pancreatitis 09/22/2016  . GERD (gastroesophageal reflux disease) 04/09/2015  . 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 Chest pain 08/20/2012  . Stress  reaction 07/23/2012  . Essential hypertension 06/09/2012  . History of pulmonary embolism 06/09/2012  . Preop cardiovascular exam 06/08/2012  . Dysmenorrhea   . Dyspareunia   . Pelvic pain     Past Surgical History:  Procedure Laterality Date  . CESAREAN SECTION  1994  . ECTOPIC PREGNANCY SURGERY  2010  . LAPAROSCOPY N/A 09/24/2012   Procedure: OPERATIVE LAPROSCOPY WITH LYSIS OF ADHESIONS;  Surgeon: Sherron Monday, MD;  Location: [facility];  Service: Gynecology;  Laterality: N/A;  . TONSILLECTOMY  1984  . TUBAL LIGATION  2010     OB History   None      Home Medications    Prior to Admission medications   Medication Sig Start Date End Date Taking? Authorizing Provider  amLODipine (NORVASC) 5 MG tablet TAKE 1 TABLET BY MOUTH ONCE DAILY Patient taking differently: Take 5 mg by mouth daily.  08/13/17  Yes Tower, Audrie Gallus, MD  ibuprofen (ADVIL,MOTRIN) 200 MG tablet Take 600 mg by mouth every 6 (six) hours as needed for headache or moderate pain.   Yes [provider]  lisinopril (PRINIVIL,ZESTRIL) 10 MG tablet TAKE 1 TABLET BY MOUTH ONCE DAILY Patient taking differently: Take 10 mg by mouth daily.  08/13/17  Yes Tower, Audrie Gallus, MD  metoprolol succinate (TOPROL XL) 25 MG 24 hr tablet Take 1 tablet (25 mg total) by mouth daily.  06/22/17  Yes Lyn Records, MD  Multiple Vitamin (MULTIVITAMIN WITH MINERALS) TABS tablet Take 1 tablet by mouth daily. 02/19/17  Yes Berton Mount I, MD  omeprazole (PRILOSEC) 20 MG capsule Take 1 capsule (20 mg total) by mouth daily. 02/23/17  Yes Tower, Audrie Gallus, MD  acetaminophen (TYLENOL) 325 MG tablet Take 2 tablets (650 mg total) by mouth every 6 (six) hours as needed. Do not take more than 4000mg  of tylenol per day Patient not taking: Reported on 09/29/2017 05/24/17   Couture, Cortni S, PA-C  cyclobenzaprine (FLEXERIL) 10 MG tablet Take 1 tablet (10 mg total) by mouth 3 (three) times daily as needed for muscle spasms. And chest wall pain Patient not  taking: Reported on 10/13/2017 09/27/17   Pricilla Loveless, MD  HYDROcodone-acetaminophen (NORCO) 5-325 MG tablet Take 1 tablet by mouth every 6 (six) hours as needed for moderate pain. Patient not taking: Reported on 10/13/2017 09/29/17   Asencion Islam, DPM  ibuprofen (ADVIL,MOTRIN) 400 MG tablet Take 1 tablet (400 mg total) by mouth every 6 (six) hours as needed. Patient not taking: Reported on 10/13/2017 05/24/17   Couture, Cortni S, PA-C  methylPREDNISolone (MEDROL DOSEPAK) 4 MG TBPK tablet Take as directed Patient not taking: Reported on 10/13/2017 09/29/17   Asencion Islam, DPM  MITIGARE 0.6 MG CAPS Take 1 tablet by mouth twice daily for 1 week Patient not taking: Reported on 10/13/2017 06/23/17   Lyn Records, MD    Family History Family History  Problem Relation Age of Onset  . Cancer - Lung Mother   . Heart failure Father   . Hypertension Father     Social History Social History   Tobacco Use  . Smoking status: Never Smoker  . Smokeless tobacco: Never Used  Substance Use Topics  . Alcohol use: Not Currently    Alcohol/week: 5.0 standard drinks    Types: 5 Cans of beer per week    Frequency: Never    Comment: has quit (March 2019)  . Drug use: No     Allergies   Patient has no known allergies.   Review of Systems Review of Systems  All systems reviewed and negative, other than as noted in HPI.  Physical Exam Updated Vital Signs BP (!) 156/95   Pulse (!) 119   Temp 98.5 F (36.9 C) (Oral)   Resp 15   Ht 5\' 5"  (1.651 m)   Wt 71.2 kg   LMP 10/11/2017   SpO2 99%   BMI 26.12 kg/m   Physical Exam  Constitutional: She appears well-developed and well-nourished. No distress.  HENT:  Head: Normocephalic and atraumatic.  Eyes: Conjunctivae are normal. Right eye exhibits no discharge. Left eye exhibits no discharge.  Neck: Neck supple.  Cardiovascular: Normal rate, regular rhythm and normal heart sounds. Exam reveals no gallop and no friction rub.  No murmur  heard. Pulmonary/Chest: Effort normal and breath sounds normal. No respiratory distress.  Abdominal: Soft. She exhibits no distension. There is no tenderness.  Musculoskeletal: She exhibits no edema or tenderness.  Lower extremities symmetric as compared to each other. No calf tenderness. Negative Homan's. No palpable cords.   Neurological: She is alert.  Skin: Skin is warm and dry.  Psychiatric: She has a normal mood and affect. Her behavior is normal. Thought content normal.  Nursing note and vitals reviewed.    ED Treatments / Results  Labs (all labs ordered are listed, but only abnormal results are displayed) Labs Reviewed  BASIC METABOLIC PANEL -  Abnormal; Notable for the following components:      Result Value   Potassium 3.4 (*)    CO2 19 (*)    Glucose, Bld 106 (*)    Anion gap 16 (*)    All other components within normal limits  CBC  I-STAT TROPONIN, ED  I-STAT BETA HCG BLOOD, ED ([facility], [facility], [facility] ONLY)    EKG EKG Interpretation  Date/Time:  Tuesday October 13 2017 12:28:08 EDT Ventricular Rate:  136 PR Interval:    QRS Duration: 76 QT Interval:  317 QTC Calculation: 477 R Axis:   -11 Text Interpretation:  Sinus tachycardia Confirmed by Raeford Razor [phone]) on 10/13/2017 1:17:36 PM   Radiology Dg Chest 2 View  Result Date: 10/13/2017 CLINICAL DATA:  Left mid chest pain for the past 2 months. Symptoms are not improving. History of pulmonary embolism in the postpartum state. Cardiomyopathy. EXAM: CHEST - 2 VIEW COMPARISON:  PA and lateral chest x-ray of May 23, 2017 FINDINGS: The lungs are adequately inflated and clear. The heart and pulmonary vascularity are normal. The mediastinum is normal in width. The trachea is midline. The bony thorax exhibits no acute abnormality. IMPRESSION: There is no active cardiopulmonary disease. Electronically Signed   By: David  [place] M.D.   On: 10/13/2017 13:35   Ct Angio Chest Pe W And/or Wo Contrast  Result Date:  10/13/2017 CLINICAL DATA:  Chest pain and tachycardia EXAM: CT ANGIOGRAPHY CHEST WITH CONTRAST TECHNIQUE: Multidetector CT imaging of the chest was performed using the standard protocol during bolus administration of intravenous contrast. Multiplanar CT image reconstructions and MIPs were obtained to evaluate the vascular anatomy. CONTRAST:  100 mL ISOVUE-370 IOPAMIDOL (ISOVUE-370) INJECTION 76% COMPARISON:  CT angiogram chest June 17, 2011; chest radiograph October 13, 2017 FINDINGS: Cardiovascular: There is no demonstrable pulmonary embolus. There is no thoracic aortic aneurysm or dissection. Visualized great vessels appear unremarkable. No pericardial effusion or pericardial thickening evident. Mediastinum/Nodes: Thyroid appears normal. There is no appreciable thoracic adenopathy. No esophageal lesions are evident. Lungs/Pleura: There is bibasilar atelectatic change. There is no edema or consolidation. No evident pleural effusion or pleural thickening. Upper Abdomen: There is hepatic steatosis. Visualized upper abdominal structures otherwise appear unremarkable. Musculoskeletal: There is a benign appearing cystic area in the left humeral head. There are no blastic or lytic bone lesions. No chest wall lesions evident. Review of the MIP images confirms the above findings. IMPRESSION: 1. No demonstrable pulmonary embolus. No thoracic aortic aneurysm or dissection. 2.  Mild bibasilar atelectasis.  No edema or consolidation. 3.  No evident thoracic adenopathy. 4.  Hepatic steatosis. Electronically Signed   By: Bretta Bang III M.D.   On: 10/13/2017 14:35    Procedures Procedures (including critical care time)  Medications Ordered in ED Medications  sodium chloride 0.9 % injection (has no administration in time range)  iopamidol (ISOVUE-370) 76 % injection (has no administration in time range)  LORazepam (ATIVAN) injection 1 mg (1 mg Intravenous Given 10/13/17 1344)  ketorolac (TORADOL) 15 MG/ML injection  15 mg (15 mg Intravenous Given 10/13/17 1343)  morphine 4 MG/ML injection 4 mg (4 mg Intravenous Given 10/13/17 1345)  sodium chloride 0.9 % bolus 1,000 mL (1,000 mLs Intravenous New Bag/Given (Non-Interop) 10/13/17 1342)  iopamidol (ISOVUE-370) 76 % injection 100 mL (100 mLs Intravenous Contrast Given 10/13/17 1409)     Initial Impression / Assessment and Plan / ED Course  I have reviewed the triage vital signs and the nursing notes.  Pertinent labs & imaging  results that were available during my care of the patient were reviewed by me and considered in my medical decision making (see chart for details).     47 year old female with chest pain.  Atypical for ACS given duration for multiple months.  She reports she has been evaluated cardiology and had a negative stress test.  I am tachycardic.  CT angiography without evidence of PE or other good concerning acute pathology.  Suspect there may potentially be an anxiety component.  I doubt emergent process. Final Clinical Impressions(s) / ED Diagnoses   Final diagnoses:  Chest pain, unspecified type    ED Discharge Orders    None       Raeford Razor, MD 10/19/17 1353

## 2017-10-13 NOTE — ED Notes (Signed)
She tells [place] she saw [facility] in July of this year, at which time she underwent a "stress test" and was prescribed Metoprolol. She states she did take her Metoprolol (25mg ) today. She is mildly anxious.

## 2017-10-15 ENCOUNTER — Ambulatory Visit: Payer: 59 | Admitting: Family Medicine

## 2017-10-22 DIAGNOSIS — M25572 Pain in left ankle and joints of left foot: Secondary | ICD-10-CM | POA: Diagnosis not present

## 2017-10-22 DIAGNOSIS — M25571 Pain in right ankle and joints of right foot: Secondary | ICD-10-CM | POA: Diagnosis not present

## 2017-10-22 DIAGNOSIS — R269 Unspecified abnormalities of gait and mobility: Secondary | ICD-10-CM | POA: Diagnosis not present

## 2017-10-27 ENCOUNTER — Encounter: Payer: Self-pay | Admitting: Sports Medicine

## 2017-10-27 ENCOUNTER — Ambulatory Visit: Payer: 59 | Admitting: Sports Medicine

## 2017-10-27 DIAGNOSIS — M79671 Pain in right foot: Secondary | ICD-10-CM | POA: Diagnosis not present

## 2017-10-27 DIAGNOSIS — M7661 Achilles tendinitis, right leg: Secondary | ICD-10-CM | POA: Diagnosis not present

## 2017-10-27 DIAGNOSIS — M79673 Pain in unspecified foot: Secondary | ICD-10-CM | POA: Diagnosis not present

## 2017-10-27 DIAGNOSIS — M79672 Pain in left foot: Secondary | ICD-10-CM

## 2017-10-27 DIAGNOSIS — M7662 Achilles tendinitis, left leg: Secondary | ICD-10-CM

## 2017-10-27 MED ORDER — HYDROCODONE-ACETAMINOPHEN 5-325 MG PO TABS: 1.0000 | ORAL_TABLET | Freq: Four times a day (QID) | ORAL | 0 refills | Status: DC | PRN

## 2017-10-27 NOTE — Progress Notes (Signed)
Subjective: Nicole Hughes is a 47 y.o. female patient who returns to office for evaluation of Right>Left heel pain. Patient complains of continued pain especially at the Achilles that is throbbing in nature on left 1-2/10 whereas the right is worse still even with PT. Ranks pain 8-7/10 on right.  Patient denies any other pedal complaints.   Patient Active Problem List   Diagnosis Date Noted  . Abnormal cervical Papanicolaou smear 09/29/2017  . Insomnia 09/29/2017  . Pain 09/29/2017  . Palpitations 05/25/2017  . Chest wall pain 05/25/2017  . Elevated transaminase level 05/25/2017  . Depression with anxiety 02/23/2017  . 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 Hyponatremia 02/17/2017  . Hypokalemia 09/24/2016  . H/O alcohol abuse 09/22/2016  . H/O acute pancreatitis 09/22/2016  . GERD (gastroesophageal reflux disease) 04/09/2015  . 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 Chest pain 08/20/2012  . Stress reaction 07/23/2012  . Essential hypertension 06/09/2012  . History of pulmonary embolism 06/09/2012  . Preop cardiovascular exam 06/08/2012  . Dysmenorrhea   . Dyspareunia   . Pelvic pain     Current Outpatient Medications on File Prior to Visit  Medication Sig Dispense Refill  . acetaminophen (TYLENOL) 325 MG tablet Take 2 tablets (650 mg total) by mouth every 6 (six) hours as needed. Do not take more than 4000mg  of tylenol per day 30 tablet 0  . amLODipine (NORVASC) 5 MG tablet TAKE 1 TABLET BY MOUTH ONCE DAILY (Patient taking differently: Take 5 mg by mouth daily. ) 90 tablet 1  . cyclobenzaprine (FLEXERIL) 10 MG tablet Take 1 tablet (10 mg total) by mouth 3 (three) times daily as needed for muscle spasms. And chest wall pain 90 tablet 0  . HYDROcodone-acetaminophen (NORCO) 5-325 MG tablet Take 1 tablet by mouth every 6 (six) hours as needed for moderate pain. 20 tablet 0  . hydrOXYzine (ATARAX/VISTARIL) 25 MG tablet Take 1 tablet (25 mg total) by mouth every 8 (eight) hours as  needed for anxiety or nausea. 21 tablet 0  . ibuprofen (ADVIL,MOTRIN) 200 MG tablet Take 600 mg by mouth every 6 (six) hours as needed for headache or moderate pain.    Marland Kitchen ibuprofen (ADVIL,MOTRIN) 400 MG tablet Take 1 tablet (400 mg total) by mouth every 6 (six) hours as needed. 30 tablet 0  . lisinopril (PRINIVIL,ZESTRIL) 10 MG tablet TAKE 1 TABLET BY MOUTH ONCE DAILY (Patient taking differently: Take 10 mg by mouth daily. ) 90 tablet 1  . methylPREDNISolone (MEDROL DOSEPAK) 4 MG TBPK tablet Take as directed 21 tablet 0  . metoprolol succinate (TOPROL XL) 25 MG 24 hr tablet Take 1 tablet (25 mg total) by mouth daily. 90 tablet 3  . MITIGARE 0.6 MG CAPS Take 1 tablet by mouth twice daily for 1 week 14 capsule 0  . Multiple Vitamin (MULTIVITAMIN WITH MINERALS) TABS tablet Take 1 tablet by mouth daily. 30 tablet 0  . omeprazole (PRILOSEC) 20 MG capsule Take 1 capsule (20 mg total) by mouth daily. 30 capsule 5   No current facility-administered medications on file prior to visit.     No Known Allergies  Objective:  General: Alert and oriented x3 in no acute distress  Dermatology: No open lesions bilateral lower extremities, no webspace macerations, no ecchymosis bilateral, all nails x 10 are well manicured.  Vascular: Dorsalis Pedis and Posterior Tibial pedal pulses 2/4, Capillary Fill Time 3 seconds, + pedal hair growth bilateral, no edema bilateral lower extremities, Temperature gradient within normal limits.  Neurology:  Gross sensation intact via light touch bilateral.  Musculoskeletal: Moderate tenderness with palpation at insertion of the Achilles on Right> mild on Left, there is calcaneal exostosis with moderate soft tissue swelling present and decreased ankle rom with knee extending  vs flexed resembling gastroc equnius bilateral, The achilles tendon feels intact with no nodularity or palpable dell, Thompson sign negative. Subjective pain sometimes to ankle on right.   Gait: Antalgic  gait   Assessment and Plan: Problem List Items Addressed This Visit    None    Visit Diagnoses    Achilles tendonitis, bilateral    -  Primary   Inflammatory heel pain, unspecified laterality       Heel pain, bilateral         -Complete examination performed -Re-Discussed treatement options for bilateral tendonitis at achilles -Refilled Norco for one last time especially since patient has pain with PT  -Dispensed Right CAM boot -Continue with heel lift on left and recommend icing daily  -Continue with gentle stretching and PT -No improvement will consider EPAT vs Surgery as previous; Will made decision at next visit   -Patient to return to office in 4 weeks for follow up evaluation sooner if condition worsens.  Asencion Islam, DPM

## 2017-11-17 DIAGNOSIS — M25572 Pain in left ankle and joints of left foot: Secondary | ICD-10-CM | POA: Diagnosis not present

## 2017-11-17 DIAGNOSIS — R269 Unspecified abnormalities of gait and mobility: Secondary | ICD-10-CM | POA: Diagnosis not present

## 2017-11-17 DIAGNOSIS — M25571 Pain in right ankle and joints of right foot: Secondary | ICD-10-CM | POA: Diagnosis not present

## 2017-11-23 DIAGNOSIS — N803 Endometriosis of pelvic peritoneum: Secondary | ICD-10-CM | POA: Diagnosis not present

## 2017-11-23 DIAGNOSIS — R102 Pelvic and perineal pain: Secondary | ICD-10-CM | POA: Diagnosis not present

## 2017-11-23 DIAGNOSIS — N939 Abnormal uterine and vaginal bleeding, unspecified: Secondary | ICD-10-CM | POA: Diagnosis not present

## 2017-11-23 DIAGNOSIS — N946 Dysmenorrhea, unspecified: Secondary | ICD-10-CM | POA: Diagnosis not present

## 2017-11-24 ENCOUNTER — Ambulatory Visit: Payer: 59 | Admitting: Sports Medicine

## 2017-11-24 ENCOUNTER — Encounter: Payer: Self-pay | Admitting: Sports Medicine

## 2017-11-24 DIAGNOSIS — M79671 Pain in right foot: Secondary | ICD-10-CM

## 2017-11-24 DIAGNOSIS — R269 Unspecified abnormalities of gait and mobility: Secondary | ICD-10-CM | POA: Diagnosis not present

## 2017-11-24 DIAGNOSIS — M25571 Pain in right ankle and joints of right foot: Secondary | ICD-10-CM | POA: Diagnosis not present

## 2017-11-24 DIAGNOSIS — M7661 Achilles tendinitis, right leg: Secondary | ICD-10-CM | POA: Diagnosis not present

## 2017-11-24 DIAGNOSIS — M79673 Pain in unspecified foot: Secondary | ICD-10-CM

## 2017-11-24 DIAGNOSIS — M79672 Pain in left foot: Secondary | ICD-10-CM

## 2017-11-24 DIAGNOSIS — M7662 Achilles tendinitis, left leg: Secondary | ICD-10-CM

## 2017-11-24 DIAGNOSIS — M25572 Pain in left ankle and joints of left foot: Secondary | ICD-10-CM | POA: Diagnosis not present

## 2017-11-24 MED ORDER — PREDNISONE 10 MG (21) PO TBPK: ORAL_TABLET | ORAL | 0 refills | Status: DC

## 2017-11-24 MED ORDER — HYDROCODONE-ACETAMINOPHEN 5-325 MG PO TABS: 1.0000 | ORAL_TABLET | Freq: Four times a day (QID) | ORAL | 0 refills | Status: DC | PRN

## 2017-11-24 NOTE — Progress Notes (Signed)
Subjective: Nicole Hughes is a 47 y.o. female patient who returns to office for evaluation of Right>Left heel pain. Patient complains of continued pain especially at the Achilles that is throbbing in nature on left 1-2/10 whereas the right is worse still even with PT like beforem. Ranks pain 6-7/10 on right.  Patient states that she is not sure if she is ready for surgery. Patient denies any other pedal complaints.   Patient Active Problem List   Diagnosis Date Noted  . Abnormal cervical Papanicolaou smear 09/29/2017  . Insomnia 09/29/2017  . Pain 09/29/2017  . Palpitations 05/25/2017  . Chest wall pain 05/25/2017  . Elevated transaminase level 05/25/2017  . Depression with anxiety 02/23/2017  . 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 Hyponatremia 02/17/2017  . Hypokalemia 09/24/2016  . H/O alcohol abuse 09/22/2016  . H/O acute pancreatitis 09/22/2016  . GERD (gastroesophageal reflux disease) 04/09/2015  . 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 Chest pain 08/20/2012  . Stress reaction 07/23/2012  . Essential hypertension 06/09/2012  . History of pulmonary embolism 06/09/2012  . Preop cardiovascular exam 06/08/2012  . Dysmenorrhea   . Dyspareunia   . Pelvic pain     Current Outpatient Medications on File Prior to Visit  Medication Sig Dispense Refill  . acetaminophen (TYLENOL) 325 MG tablet Take 2 tablets (650 mg total) by mouth every 6 (six) hours as needed. Do not take more than 4000mg  of tylenol per day 30 tablet 0  . amLODipine (NORVASC) 5 MG tablet TAKE 1 TABLET BY MOUTH ONCE DAILY (Patient taking differently: Take 5 mg by mouth daily. ) 90 tablet 1  . cyclobenzaprine (FLEXERIL) 10 MG tablet Take 1 tablet (10 mg total) by mouth 3 (three) times daily as needed for muscle spasms. And chest wall pain 90 tablet 0  . HYDROcodone-acetaminophen (NORCO) 5-325 MG tablet Take 1 tablet by mouth every 6 (six) hours as needed for moderate pain. 20 tablet 0  . hydrOXYzine (ATARAX/VISTARIL)  25 MG tablet Take 1 tablet (25 mg total) by mouth every 8 (eight) hours as needed for anxiety or nausea. 21 tablet 0  . ibuprofen (ADVIL,MOTRIN) 200 MG tablet Take 600 mg by mouth every 6 (six) hours as needed for headache or moderate pain.    Marland Kitchen ibuprofen (ADVIL,MOTRIN) 400 MG tablet Take 1 tablet (400 mg total) by mouth every 6 (six) hours as needed. 30 tablet 0  . lisinopril (PRINIVIL,ZESTRIL) 10 MG tablet TAKE 1 TABLET BY MOUTH ONCE DAILY (Patient taking differently: Take 10 mg by mouth daily. ) 90 tablet 1  . methylPREDNISolone (MEDROL DOSEPAK) 4 MG TBPK tablet Take as directed 21 tablet 0  . metoprolol succinate (TOPROL XL) 25 MG 24 hr tablet Take 1 tablet (25 mg total) by mouth daily. 90 tablet 3  . MITIGARE 0.6 MG CAPS Take 1 tablet by mouth twice daily for 1 week 14 capsule 0  . Multiple Vitamin (MULTIVITAMIN WITH MINERALS) TABS tablet Take 1 tablet by mouth daily. 30 tablet 0  . omeprazole (PRILOSEC) 20 MG capsule Take 1 capsule (20 mg total) by mouth daily. 30 capsule 5   No current facility-administered medications on file prior to visit.     No Known Allergies  Objective:  General: Alert and oriented x3 in no acute distress  Dermatology: No open lesions bilateral lower extremities, no webspace macerations, no ecchymosis bilateral, all nails x 10 are well manicured.  Vascular: Dorsalis Pedis and Posterior Tibial pedal pulses 2/4, Capillary Fill Time 3 seconds, + pedal  hair growth bilateral, no edema bilateral lower extremities, Temperature gradient within normal limits.  Neurology: Michaell CowingGross sensation intact via light touch bilateral.  Musculoskeletal: Moderate tenderness with palpation at insertion of the Achilles on Right> and very mild on Left, there is calcaneal exostosis with moderate soft tissue swelling present and decreased ankle rom with knee extending  vs flexed resembling gastroc equnius bilateral, The achilles tendon feels intact with no nodularity or palpable dell,  Thompson sign negative. Subjective pain sometimes to ankle on right like before with difficulty walking or standing, boot helps.   Gait: Antalgic gait   Assessment and Plan: Problem List Items Addressed This Visit    None    Visit Diagnoses    Achilles tendonitis, bilateral    -  Primary   Inflammatory heel pain, unspecified laterality       Heel pain, bilateral       Acute right ankle pain         -Complete examination performed -Re-Discussed treatement options for bilateral tendonitis at achilles -Patient declines surgery  -Rx Prednisone 10mg  dose pak -Refilled Norco for one last time especially since patient has pain with PT  -Continue with Right CAM boot -Continue with heel lift on left and recommend icing daily  -Continue with gentle stretching and PT as tolerated -No improvement will consider EPAT since patient does not want surgery  -Patient to return to office in 4-6 weeks for follow up evaluation sooner if condition worsens.  Asencion Islamitorya Josie Burleigh, DPM

## 2017-12-17 DIAGNOSIS — R269 Unspecified abnormalities of gait and mobility: Secondary | ICD-10-CM | POA: Diagnosis not present

## 2017-12-17 DIAGNOSIS — M25571 Pain in right ankle and joints of right foot: Secondary | ICD-10-CM | POA: Diagnosis not present

## 2017-12-17 DIAGNOSIS — M25572 Pain in left ankle and joints of left foot: Secondary | ICD-10-CM | POA: Diagnosis not present

## 2017-12-24 ENCOUNTER — Telehealth: Payer: Self-pay | Admitting: Sports Medicine

## 2017-12-24 ENCOUNTER — Other Ambulatory Visit: Payer: Self-pay | Admitting: Sports Medicine

## 2017-12-24 DIAGNOSIS — M7662 Achilles tendinitis, left leg: Secondary | ICD-10-CM

## 2017-12-24 DIAGNOSIS — M79673 Pain in unspecified foot: Secondary | ICD-10-CM

## 2017-12-24 DIAGNOSIS — M25572 Pain in left ankle and joints of left foot: Secondary | ICD-10-CM | POA: Diagnosis not present

## 2017-12-24 DIAGNOSIS — R269 Unspecified abnormalities of gait and mobility: Secondary | ICD-10-CM | POA: Diagnosis not present

## 2017-12-24 DIAGNOSIS — M7661 Achilles tendinitis, right leg: Secondary | ICD-10-CM

## 2017-12-24 DIAGNOSIS — M25571 Pain in right ankle and joints of right foot: Secondary | ICD-10-CM | POA: Diagnosis not present

## 2017-12-24 MED ORDER — HYDROCODONE-ACETAMINOPHEN 5-325 MG PO TABS: 1.0000 | ORAL_TABLET | Freq: Four times a day (QID) | ORAL | 0 refills | Status: DC | PRN

## 2017-12-24 NOTE — Telephone Encounter (Signed)
Left message informing pt Dr. Mary[facility]Sto[id number]

## 2017-12-24 NOTE — Telephone Encounter (Signed)
I sent pain medication she tends to have a lot of pain to her achilles -Dr. Marylene LandStover

## 2017-12-24 NOTE — Telephone Encounter (Signed)
Left message requesting a call back to discuss her pain.

## 2017-12-24 NOTE — Progress Notes (Signed)
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 Marylene LandStover

## 2018-01-12 ENCOUNTER — Ambulatory Visit: Payer: 59 | Admitting: Sports Medicine

## 2018-01-18 ENCOUNTER — Telehealth: Payer: Self-pay | Admitting: Family Medicine

## 2018-01-18 ENCOUNTER — Ambulatory Visit: Payer: 59 | Admitting: Family Medicine

## 2018-01-18 NOTE — Telephone Encounter (Signed)
Left message asking pt to call office please r/s 01/18/18 with dr tower °Dr tower out of office °

## 2018-01-19 ENCOUNTER — Encounter: Payer: Self-pay | Admitting: Sports Medicine

## 2018-01-19 ENCOUNTER — Ambulatory Visit: Payer: 59 | Admitting: Sports Medicine

## 2018-01-19 DIAGNOSIS — M7751 Other enthesopathy of right foot: Secondary | ICD-10-CM | POA: Diagnosis not present

## 2018-01-19 DIAGNOSIS — M7661 Achilles tendinitis, right leg: Secondary | ICD-10-CM | POA: Diagnosis not present

## 2018-01-19 DIAGNOSIS — M79673 Pain in unspecified foot: Secondary | ICD-10-CM | POA: Diagnosis not present

## 2018-01-19 DIAGNOSIS — M79671 Pain in right foot: Secondary | ICD-10-CM

## 2018-01-19 DIAGNOSIS — M7662 Achilles tendinitis, left leg: Secondary | ICD-10-CM

## 2018-01-19 DIAGNOSIS — M79672 Pain in left foot: Secondary | ICD-10-CM

## 2018-01-19 DIAGNOSIS — M216X1 Other acquired deformities of right foot: Secondary | ICD-10-CM

## 2018-01-19 MED ORDER — HYDROCODONE-ACETAMINOPHEN 5-325 MG PO TABS: 1.0000 | ORAL_TABLET | Freq: Four times a day (QID) | ORAL | 0 refills | Status: DC | PRN

## 2018-01-19 MED ORDER — PREDNISONE 10 MG (21) PO TBPK: ORAL_TABLET | ORAL | 0 refills | Status: DC

## 2018-01-19 NOTE — Progress Notes (Signed)
Subjective: Nicole Hughes is a 48 y.o. female patient who returns to office for evaluation of Right>Left heel pain. Patient complains of continued pain especially at the Achilles that is throbbing in nature on left 0/10 feels 100% better whereas the right is worse still significantly painful 7 out of 10 however reports that her cam boot offer some relief and her pain medicine offer some relief.  Patient states that she is not sure if she has been approved for more sessions of therapy and would like to discuss other treatment options because of the ongoing pain may have to consider something more aggressive on the right or even surgery.   Patient denies any changes with medical history since last encounter. Patient denies any other pedal complaints.   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   Patient Active Problem List   Diagnosis Date Noted  . Abnormal cervical Papanicolaou smear 09/29/2017  . Insomnia 09/29/2017  . Pain 09/29/2017  . Palpitations 05/25/2017  . Chest wall pain 05/25/2017  . Elevated transaminase level 05/25/2017  . Depression with anxiety 02/23/2017  . 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 Hyponatremia 02/17/2017  . Hypokalemia 09/24/2016  . H/O alcohol abuse 09/22/2016  . H/O acute pancreatitis 09/22/2016  . GERD (gastroesophageal reflux disease) 04/09/2015  . 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 Chest pain 08/20/2012  . Stress reaction 07/23/2012  . Essential hypertension 06/09/2012  . History of pulmonary embolism 06/09/2012  . Preop cardiovascular exam 06/08/2012  . Dysmenorrhea   . Dyspareunia   . Pelvic pain     Current Outpatient Medications on File Prior to Visit  Medication Sig Dispense Refill  . acetaminophen (TYLENOL) 325 MG tablet Take 2 tablets (650 mg total) by mouth every 6 (six) hours as needed. Do not take more than 4000mg  of tylenol per day 30 tablet 0  . amLODipine (NORVASC) 5 MG tablet TAKE 1 TABLET BY MOUTH ONCE DAILY (Patient  taking differently: Take 5 mg by mouth daily. ) 90 tablet 1  . cyclobenzaprine (FLEXERIL) 10 MG tablet Take 1 tablet (10 mg total) by mouth 3 (three) times daily as needed for muscle spasms. And chest wall pain 90 tablet 0  . hydrOXYzine (ATARAX/VISTARIL) 25 MG tablet Take 1 tablet (25 mg total) by mouth every 8 (eight) hours as needed for anxiety or nausea. 21 tablet 0  . ibuprofen (ADVIL,MOTRIN) 200 MG tablet Take 600 mg by mouth every 6 (six) hours as needed for headache or moderate pain.    Marland Kitchen ibuprofen (ADVIL,MOTRIN) 400 MG tablet Take 1 tablet (400 mg total) by mouth every 6 (six) hours as needed. 30 tablet 0  . lisinopril (PRINIVIL,ZESTRIL) 10 MG tablet TAKE 1 TABLET BY MOUTH ONCE DAILY (Patient taking differently: Take 10 mg by mouth daily. ) 90 tablet 1  . methylPREDNISolone (MEDROL DOSEPAK) 4 MG TBPK tablet Take as directed 21 tablet 0  . metoprolol succinate (TOPROL XL) 25 MG 24 hr tablet Take 1 tablet (25 mg total) by mouth daily. 90 tablet 3  . MITIGARE 0.6 MG CAPS Take 1 tablet by mouth twice daily for 1 week 14 capsule 0  . Multiple Vitamin (MULTIVITAMIN WITH MINERALS) TABS tablet Take 1 tablet by mouth daily. 30 tablet 0  . omeprazole (PRILOSEC) 20 MG capsule Take 1 capsule (20 mg total) by mouth daily. 30 capsule 5   No current facility-administered medications on file prior to visit.     No Known Allergies  Objective:  General: Alert and  oriented x3 in no acute distress  Dermatology: No open lesions bilateral lower extremities, no webspace macerations, no ecchymosis bilateral, all nails x 10 are well manicured.  Vascular: Dorsalis Pedis and Posterior Tibial pedal pulses 2/4, Capillary Fill Time 3 seconds, + pedal hair growth bilateral, no edema bilateral lower extremities, Temperature gradient within normal limits.  Neurology: Michaell Cowing sensation intact via light touch bilateral.  Musculoskeletal: Moderate tenderness with palpation at insertion of the Achilles on Right> and no  tenderness on Left, there is calcaneal exostosis with moderate soft tissue swelling present and decreased ankle rom with knee extending  vs flexed resembling gastroc equnius bilateral, The achilles tendon feels intact with no nodularity or palpable dell, Thompson sign negative. Subjective pain sometimes to ankle on right like before with difficulty walking or standing, boot helps like before.   Gait: Antalgic gait   Assessment and Plan: Problem List Items Addressed This Visit    None    Visit Diagnoses    Right calcaneal bursitis    -  Primary   Achilles tendonitis, bilateral       Right still symptomatic Left currently asymptomatic   Relevant Medications   HYDROcodone-acetaminophen (NORCO) 5-325 MG tablet   Inflammatory heel pain, unspecified laterality       Right   Relevant Medications   HYDROcodone-acetaminophen (NORCO) 5-325 MG tablet   Acquired equinus deformity of right foot       Heel pain, bilateral       Right still symptomatic Left asymptomatic     -Complete examination performed -Re-Discussed treatement options for bilateral tendonitis at achilles with the right side still being symptomatic -Patient declines surgery at this time however is willing to consider it if she can get someone to help care for her dad and if she can get a rough estimate on the cost of surgery; Chip Boer and billing office to call her with this estimate I advised patient that this estimate does not include any additional facility fees or anesthesia fees or any possible other procedure fees this is just a basic "to give her an idea of whether she should do this versus try EPAT -Rx Prednisone 10mg  dose pak again this visit to see if this will give her some additional relief -Refilled Norco -New prescription was written for physical therapy to see if we can get some more sessions approved this time since it helped tremendously for her left -Continue with Right CAM boot -Continue with heel lift on left and  recommend icing daily to prevent recurrence of worsening of symptoms on left -Continue with gentle home stretching -No improvement will consider EPAT versus surgery versus steroid injection if she can take a week off from activity to allow her heel to rest after injection because do not want to increase any risk of rupture of the tendon -Patient to return to office when she is ready for right Achilles injection or sooner if condition worsens.  Asencion Islam, DPM

## 2018-01-20 ENCOUNTER — Ambulatory Visit: Payer: 59 | Admitting: Family Medicine

## 2018-01-21 ENCOUNTER — Telehealth: Payer: Self-pay | Admitting: *Deleted

## 2018-01-21 DIAGNOSIS — M7662 Achilles tendinitis, left leg: Secondary | ICD-10-CM

## 2018-01-21 DIAGNOSIS — M7661 Achilles tendinitis, right leg: Secondary | ICD-10-CM

## 2018-01-21 DIAGNOSIS — M79673 Pain in unspecified foot: Secondary | ICD-10-CM

## 2018-01-21 NOTE — Telephone Encounter (Signed)
Hand delivered [facility] rx.

## 2018-01-21 NOTE — Telephone Encounter (Signed)
-----   Message from Asencion Islam, [place] sent at 01/19/2018  4:51 PM EST ----- Regarding: New Rx for PT to see if we can get more/new sessions for this year New Rx for PT R>L achilles tendonitis

## 2018-01-26 ENCOUNTER — Emergency Department (HOSPITAL_COMMUNITY): Payer: 59

## 2018-01-26 ENCOUNTER — Ambulatory Visit: Payer: 59 | Admitting: Family Medicine

## 2018-01-26 ENCOUNTER — Telehealth: Payer: Self-pay

## 2018-01-26 ENCOUNTER — Encounter (HOSPITAL_COMMUNITY): Payer: Self-pay

## 2018-01-26 ENCOUNTER — Emergency Department (HOSPITAL_COMMUNITY)
Admission: EM | Admit: 2018-01-26 | Discharge: 2018-01-26 | Disposition: A | Discharge status: Home / Self Care | Payer: 59 | Attending: Emergency Medicine | Admitting: Emergency Medicine

## 2018-01-26 ENCOUNTER — Other Ambulatory Visit: Payer: Self-pay

## 2018-01-26 DIAGNOSIS — Z79899 Other long term (current) drug therapy: Secondary | ICD-10-CM | POA: Diagnosis not present

## 2018-01-26 DIAGNOSIS — K760 Fatty (change of) liver, not elsewhere classified: Secondary | ICD-10-CM | POA: Diagnosis not present

## 2018-01-26 DIAGNOSIS — R079 Chest pain, unspecified: Secondary | ICD-10-CM | POA: Diagnosis not present

## 2018-01-26 DIAGNOSIS — I10 Essential (primary) hypertension: Secondary | ICD-10-CM | POA: Insufficient documentation

## 2018-01-26 DIAGNOSIS — R109 Unspecified abdominal pain: Secondary | ICD-10-CM | POA: Diagnosis not present

## 2018-01-26 DIAGNOSIS — R0789 Other chest pain: Secondary | ICD-10-CM | POA: Diagnosis not present

## 2018-01-26 DIAGNOSIS — R102 Pelvic and perineal pain: Secondary | ICD-10-CM | POA: Insufficient documentation

## 2018-01-26 DIAGNOSIS — R0602 Shortness of breath: Secondary | ICD-10-CM | POA: Diagnosis not present

## 2018-01-26 LAB — HEPATIC FUNCTION PANEL
ALT: 71 U/L — ABNORMAL HIGH (ref 0–44)
AST: 87 U/L — ABNORMAL HIGH (ref 15–41)
Albumin: 4.9 g/dL (ref 3.5–5.0)
Alkaline Phosphatase: 52 U/L (ref 38–126)
Bilirubin, Direct: 0.1 mg/dL (ref 0.0–0.2)
Indirect Bilirubin: 0.6 mg/dL (ref 0.3–0.9)
Total Bilirubin: 0.7 mg/dL (ref 0.3–1.2)
Total Protein: 7.7 g/dL (ref 6.5–8.1)

## 2018-01-26 LAB — URINALYSIS, ROUTINE W REFLEX MICROSCOPIC
Bilirubin Urine: NEGATIVE
Glucose, UA: NEGATIVE mg/dL
Hgb urine dipstick: NEGATIVE
Ketones, ur: NEGATIVE mg/dL
Leukocytes, UA: NEGATIVE
Nitrite: NEGATIVE
Protein, ur: NEGATIVE mg/dL
Specific Gravity, Urine: 1.021 (ref 1.005–1.030)
pH: 7 (ref 5.0–8.0)

## 2018-01-26 LAB — BASIC METABOLIC PANEL
Anion gap: 13 (ref 5–15)
BUN: 7 mg/dL (ref 6–20)
CO2: 20 mmol/L — ABNORMAL LOW (ref 22–32)
Calcium: 9.1 mg/dL (ref 8.9–10.3)
Chloride: 103 mmol/L (ref 98–111)
Creatinine, Ser: 0.54 mg/dL (ref 0.44–1.00)
GFR calc Af Amer: >60 mL/min (ref >60)
GFR calc non Af Amer: >60 mL/min (ref >60)
Glucose, Bld: 103 mg/dL — ABNORMAL HIGH (ref 70–99)
Potassium: 3.5 mmol/L (ref 3.5–5.1)
Sodium: 136 mmol/L (ref 135–145)

## 2018-01-26 LAB — I-STAT TROPONIN, ED: Troponin i, poc: 0 ng/mL (ref 0.00–0.08)

## 2018-01-26 LAB — CBC WITH DIFFERENTIAL/PLATELET
Abs Immature Granulocytes: 0.03 10*3/uL (ref 0.00–0.07)
Basophils Absolute: 0 10*3/uL (ref 0.0–0.1)
Basophils Relative: 1 %
Eosinophils Absolute: 0.1 10*3/uL (ref 0.0–0.5)
Eosinophils Relative: 1 %
HCT: 37.6 % (ref 36.0–46.0)
Hemoglobin: 12.1 g/dL (ref 12.0–15.0)
Immature Granulocytes: 1 %
Lymphocytes Relative: 26 %
Lymphs Abs: 1.6 10*3/uL (ref 0.7–4.0)
MCH: 28.1 pg (ref 26.0–34.0)
MCHC: 32.2 g/dL (ref 30.0–36.0)
MCV: 87.2 fL (ref 80.0–100.0)
Monocytes Absolute: 0.5 10*3/uL (ref 0.1–1.0)
Monocytes Relative: 9 %
Neutro Abs: 3.8 10*3/uL (ref 1.7–7.7)
Neutrophils Relative %: 62 %
Platelets: 229 10*3/uL (ref 150–400)
RBC: 4.31 MIL/uL (ref 3.87–5.11)
RDW: 14.2 % (ref 11.5–15.5)
WBC: 6 10*3/uL (ref 4.0–10.5)
nRBC: 0 % (ref 0.0–0.2)

## 2018-01-26 LAB — I-STAT BETA HCG BLOOD, ED (MC, WL, AP ONLY): I-stat hCG, quantitative: <5 m[IU]/mL (ref <5)

## 2018-01-26 LAB — LIPASE, BLOOD: Lipase: 58 U/L — ABNORMAL HIGH (ref 11–51)

## 2018-01-26 MED ORDER — IOPAMIDOL (ISOVUE-370) INJECTION 76%
100.0000 mL | Freq: Once | INTRAVENOUS | Status: AC | PRN
  Administered 2018-01-26: 100 mL via INTRAVENOUS

## 2018-01-26 MED ORDER — IOPAMIDOL (ISOVUE-370) INJECTION 76%
INTRAVENOUS | Status: AC
  Filled 2018-01-26: qty 100

## 2018-01-26 MED ORDER — HYDROMORPHONE HCL 1 MG/ML IJ SOLN
1.0000 mg | Freq: Once | INTRAMUSCULAR | Status: AC
  Administered 2018-01-26: 1 mg via INTRAVENOUS
  Filled 2018-01-26: qty 1

## 2018-01-26 MED ORDER — FAMOTIDINE IN NACL 20-0.9 MG/50ML-% IV SOLN
20.0000 mg | Freq: Once | INTRAVENOUS | Status: AC
  Administered 2018-01-26: 20 mg via INTRAVENOUS
  Filled 2018-01-26: qty 50

## 2018-01-26 MED ORDER — PROMETHAZINE HCL 25 MG/ML IJ SOLN
25.0000 mg | Freq: Once | INTRAMUSCULAR | Status: AC
  Administered 2018-01-26: 25 mg via INTRAVENOUS
  Filled 2018-01-26: qty 1

## 2018-01-26 MED ORDER — SODIUM CHLORIDE (PF) 0.9 % IJ SOLN
INTRAMUSCULAR | Status: AC
  Filled 2018-01-26: qty 50

## 2018-01-26 MED ORDER — SODIUM CHLORIDE 0.9 % IV BOLUS
1000.0000 mL | Freq: Once | INTRAVENOUS | Status: AC
  Administered 2018-01-26: 1000 mL via INTRAVENOUS

## 2018-01-26 NOTE — ED Triage Notes (Signed)
Pt presents with chest pain on and off for 1 year. Has been seen by Cardiologist. Flank pain for 3 weeks. Rt side great than left. Right side is constant. N/V/ without diarrhea. Chills. HX of UTI

## 2018-01-26 NOTE — Telephone Encounter (Signed)
[name], [name] Patient DOB 1970-10-30 Call Type Message Only Information Provided Reason for Call [facility] Initial Comment Caller states, appt this am. Going to ER. Kidney pain, awake for 3 days, cant drink or eat. Chest pain. Additional Comment Declined rn advice Call Closed By: Terrilee FilesLindsey Wilder Transaction Date/Time: 01/26/2018 7:10:57 AM (ET)

## 2018-01-26 NOTE — Telephone Encounter (Signed)
I cancelled appt for 01/26/18

## 2018-01-26 NOTE — ED Provider Notes (Signed)
[facility] Provider Note   CSN: [id number] Arrival date & time: 01/26/18  0751     History   Chief Complaint Chief Complaint  Patient presents with  . Flank Pain  . Chest Pain    HPI Nicole Hughes is a 48 y.o. female history of cardiomyopathy, endometriosis, previous pulmonary embolus, presenting with chest pain, abdominal pain.  Patient states that she has been having on-and-off chest pain for the last year or so.  She had previous evaluations including normal stress test as well as she saw cardiology within the last year and was thought to have noncardiac chest pain.  Patient states that her chest pains been on and off for a year and is worse when she drinks water.  She recently had Achilles tendonitis and is currently using a boot.  She has some subjective shortness of breath and states that she had a PE in year 2000.  She states that she has right flank pain for the last 3 weeks or so.  Flank pain is sharp and progressively getting worse.  She denies any urinary symptoms.  She denies any fevers or chills.  She states that she does feel nauseated all the time.  She was supposed to see her primary care doctor next week but states that she could not wait due to the pain.   The history is provided by the patient.    Past Medical History:  Diagnosis Date  . Anxiety   . Cardiomyopathy (HCC)   . Chronic lower back pain   . Complication of anesthesia    "hard to get under"  . Cyst of ovary   . Depression    hx  . DVT complicating pregnancy 08/1998   RLE; RUE  . Dysmenorrhea   . Dyspareunia   . 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 GERD (gastroesophageal reflux disease)   . Hay fever    "fall and spring" (02/17/2017)  . Headache    "2-3 times/.wik" (02/17/2017)  . History of chicken pox   . Hypertension   . Migraine    "monthly" (02/17/2017)  . Pelvic pain   . Pulmonary embolism (HCC) 08/1998   S/P childbirth    Patient  Active Problem List   Diagnosis Date Noted  . Abnormal cervical Papanicolaou smear 09/29/2017  . Insomnia 09/29/2017  . Pain 09/29/2017  . Palpitations 05/25/2017  . Chest wall pain 05/25/2017  . Elevated transaminase level 05/25/2017  . Depression with anxiety 02/23/2017  . 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 Hyponatremia 02/17/2017  . Hypokalemia 09/24/2016  . H/O alcohol abuse 09/22/2016  . H/O acute pancreatitis 09/22/2016  . GERD (gastroesophageal reflux disease) 04/09/2015  . 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 Chest pain 08/20/2012  . Stress reaction 07/23/2012  . Essential hypertension 06/09/2012  . History of pulmonary embolism 06/09/2012  . Preop cardiovascular exam 06/08/2012  . Dysmenorrhea   . Dyspareunia   . Pelvic pain     Past Surgical History:  Procedure Laterality Date  . CESAREAN SECTION  1994  . ECTOPIC PREGNANCY SURGERY  2010  . LAPAROSCOPY N/A 09/24/2012   Procedure: OPERATIVE LAPROSCOPY WITH LYSIS OF ADHESIONS;  Surgeon: Sherron Monday, MD;  Location: [facility];  Service: Gynecology;  Laterality: N/A;  . TONSILLECTOMY  1984  . TUBAL LIGATION  2010     OB History   No obstetric history on file.      Home Medications    Prior to Admission medications  Medication Sig Start Date End Date Taking? Authorizing Provider  amLODipine (NORVASC) 5 MG tablet TAKE 1 TABLET BY MOUTH ONCE DAILY Patient taking differently: Take 5 mg by mouth daily.  08/13/17  Yes Tower, Audrie GallusMarne A, MD  HYDROcodone-acetaminophen (NORCO) 5-325 MG tablet Take 1 tablet by mouth every 6 (six) hours as needed for moderate pain. 01/19/18  Yes Stover, Titorya, DPM  ibuprofen (ADVIL,MOTRIN) 200 MG tablet Take 400 mg by mouth every 6 (six) hours as needed for headache or mild pain.   Yes [provider]  lisinopril (PRINIVIL,ZESTRIL) 10 MG tablet TAKE 1 TABLET BY MOUTH ONCE DAILY Patient taking differently: Take 10 mg by mouth daily.  08/13/17  Yes Tower, Audrie GallusMarne A, MD  metoprolol  succinate (TOPROL XL) 25 MG 24 hr tablet Take 1 tablet (25 mg total) by mouth daily. 06/22/17  Yes Lyn RecordsSmith, Henry W, MD  Multiple Vitamin (MULTIVITAMIN WITH MINERALS) TABS tablet Take 1 tablet by mouth daily. 02/19/17  Yes Berton Mountgbata, Sylvester I, MD  omeprazole (PRILOSEC) 20 MG capsule Take 1 capsule (20 mg total) by mouth daily. 02/23/17  Yes Tower, Audrie GallusMarne A, MD  acetaminophen (TYLENOL) 325 MG tablet Take 2 tablets (650 mg total) by mouth every 6 (six) hours as needed. Do not take more than 4000mg  of tylenol per day Patient not taking: Reported on 01/26/2018 05/24/17   Couture, Cortni S, PA-C  cyclobenzaprine (FLEXERIL) 10 MG tablet Take 1 tablet (10 mg total) by mouth 3 (three) times daily as needed for muscle spasms. And chest wall pain Patient not taking: Reported on 01/26/2018 09/27/17   Pricilla LovelessGoldston, Scott, MD  hydrOXYzine (ATARAX/VISTARIL) 25 MG tablet Take 1 tablet (25 mg total) by mouth every 8 (eight) hours as needed for anxiety or nausea. Patient not taking: Reported on 01/26/2018 10/13/17   Raeford RazorKohut, Stephen, MD  ibuprofen (ADVIL,MOTRIN) 400 MG tablet Take 1 tablet (400 mg total) by mouth every 6 (six) hours as needed. Patient not taking: Reported on 01/26/2018 05/24/17   Couture, Cortni S, PA-C  methylPREDNISolone (MEDROL DOSEPAK) 4 MG TBPK tablet Take as directed Patient not taking: Reported on 01/26/2018 09/29/17   Asencion IslamStover, Titorya, DPM  MITIGARE 0.6 MG CAPS Take 1 tablet by mouth twice daily for 1 week Patient not taking: Reported on 01/26/2018 06/23/17   Lyn RecordsSmith, Henry W, MD  predniSONE (STERAPRED UNI-PAK 21 TAB) 10 MG (21) TBPK tablet Take as directed Patient not taking: Reported on 01/26/2018 01/19/18   Asencion IslamStover, Titorya, DPM    Family History Family History  Problem Relation Age of Onset  . Cancer - Lung Mother   . Heart failure Father   . Hypertension Father     Social History Social History   Tobacco Use  . Smoking status: Never Smoker  . Smokeless tobacco: Never Used  Substance Use Topics  .  Alcohol use: Not Currently    Alcohol/week: 5.0 standard drinks    Types: 5 Cans of beer per week    Frequency: Never    Comment: has quit (March 2019)  . Drug use: No     Allergies   Patient has no known allergies.   Review of Systems Review of Systems  Cardiovascular: Positive for chest pain.  Genitourinary: Positive for flank pain.  All other systems reviewed and are negative.    Physical Exam Updated Vital Signs BP (!) 157/96   Pulse 84   Temp 98.1 F (36.7 C) (Oral)   Resp 20   Ht 5\' 5"  (1.651 m)   Wt 74.8 kg  LMP 12/26/2017   SpO2 98%   BMI 27.46 kg/m   Physical Exam Vitals signs and nursing note reviewed.  Constitutional:      Appearance: She is well-developed.  HENT:     Head: Normocephalic.  Eyes:     Extraocular Movements: Extraocular movements intact.     Pupils: Pupils are equal, round, and reactive to light.  Neck:     Musculoskeletal: Normal range of motion.  Cardiovascular:     Rate and Rhythm: Normal rate and regular rhythm.     Heart sounds: Normal heart sounds.  Pulmonary:     Effort: Pulmonary effort is normal.     Breath sounds: Normal breath sounds.  Abdominal:     Palpations: Abdomen is soft.     Comments: Mild R CVAT   Musculoskeletal: Normal range of motion.  Skin:    General: Skin is warm.     Capillary Refill: Capillary refill takes less than 2 seconds.  Neurological:     General: No focal deficit present.     Mental Status: She is alert.  Psychiatric:        Mood and Affect: Mood normal.        Behavior: Behavior normal.      ED Treatments / Results  Labs (all labs ordered are listed, but only abnormal results are displayed) Labs Reviewed  BASIC METABOLIC PANEL - Abnormal; Notable for the following components:      Result Value   CO2 20 (*)    Glucose, Bld 103 (*)    All other components within normal limits  HEPATIC FUNCTION PANEL - Abnormal; Notable for the following components:   AST 87 (*)    ALT 71 (*)      All other components within normal limits  LIPASE, BLOOD - Abnormal; Notable for the following components:   Lipase 58 (*)    All other components within normal limits  URINALYSIS, ROUTINE W REFLEX MICROSCOPIC - Abnormal; Notable for the following components:   Color, Urine STRAW (*)    All other components within normal limits  CBC WITH DIFFERENTIAL/PLATELET  I-STAT BETA HCG BLOOD, ED ([facility], [facility], [facility] ONLY)  I-STAT TROPONIN, ED    EKG EKG Interpretation  Date/Time:  Tuesday January 26 2018 08:04:18 EST Ventricular Rate:  94 PR Interval:    QRS Duration: 89 QT Interval:  399 QTC Calculation: 499 R Axis:   3 Text Interpretation:  Sinus rhythm Borderline prolonged QT interval Baseline wander in lead(s) II III aVF No significant change since last tracing Confirmed by Richardean Canal [phone]) on 01/26/2018 8:18:04 AM   Radiology Dg Chest 2 View  Result Date: 01/26/2018 CLINICAL DATA:  48 year old female with central and left lower severe chest pain today. Hypertensive. EXAM: CHEST - 2 VIEW COMPARISON:  CTA chest 10/13/2017 and earlier. FINDINGS: Lower lung volumes on AP and lateral views today. Mediastinal contours remain normal. Visualized tracheal air column is within normal limits. No pneumothorax, pulmonary edema, pleural effusion or consolidation. Mild crowding of markings/atelectasis at the lung bases. Stable and largely negative visible osseous structures. Negative visible bowel gas pattern. IMPRESSION: Low lung volumes, otherwise negative. Electronically Signed   By: Odessa Fleming M.D.   On: 01/26/2018 09:01   Ct Angio Chest Pe W And/or Wo Contrast  Result Date: 01/26/2018 CLINICAL DATA:  Upper left cp.   Never smoker. Hx blood clots in her legs. "Pt presents with chest pain on and off for 1 year. Has been seen by Cardiologist.  Flank pain for 3 weeks. Rt side great than left. Right side is constant. N/V/ without diarrhea. Chills. HX of UTI" EXAM: CT ANGIOGRAPHY CHEST CT ABDOMEN AND PELVIS WITH  CONTRAST TECHNIQUE: Multidetector CT imaging of the chest was performed using the standard protocol during bolus administration of intravenous contrast. Multiplanar CT image reconstructions and MIPs were obtained to evaluate the vascular anatomy. Multidetector CT imaging of the abdomen and pelvis was performed using the standard protocol during bolus administration of intravenous contrast. CONTRAST:  100mL ISOVUE-370 IOPAMIDOL (ISOVUE-370) INJECTION 76% COMPARISON:  10/13/2017 and previous FINDINGS: CTA CHEST FINDINGS Cardiovascular: Heart size normal. No pericardial effusion. The RV is nondilated. Satisfactory opacification of pulmonary arteries noted, and there is no evidence of pulmonary emboli. Adequate contrast opacification of the thoracic aorta with no evidence of dissection, aneurysm, or stenosis. There is variant brachiocephalic arch anatomy without proximal stenosis. The left vertebral artery arises directly from the arch. Minimal calcified atheromatous plaque in the arch and descending thoracic segment. Mediastinum/Nodes: No hilar or mediastinal adenopathy. Lungs/Pleura: No pleural effusion. No pneumothorax. Ground-glass opacities posteriorly in both lower lobes probably dependent atelectasis. Lungs otherwise clear. Musculoskeletal: No chest wall abnormality. No acute or significant osseous findings. Review of the MIP images confirms the above findings. CT ABDOMEN and PELVIS FINDINGS Hepatobiliary: Fatty liver. No focal lesion. No biliary ductal dilatation. Gallbladder unremarkable. Pancreas: Unremarkable. No pancreatic ductal dilatation or surrounding inflammatory changes. Spleen: Normal in size without focal abnormality. Adrenals/Urinary Tract: Adrenal glands are unremarkable. Kidneys are normal, without renal calculi, focal lesion, or hydronephrosis. Bladder is unremarkable. Stomach/Bowel: Stomach is within normal limits. Appendix appears normal. No evidence of bowel wall thickening, distention, or  inflammatory changes. Vascular/Lymphatic: Mild partially calcified aortoiliac atheromatous plaque without aneurysm or high-grade stenosis. No abdominal or pelvic adenopathy. Portal vein patent. Reproductive: Uterus and bilateral adnexa are unremarkable. Other: Bilateral pelvic phleboliths. No ascites. No free air. Musculoskeletal: Umbilical hardware resulting in mild streak artifact. Mild spondylitic changes L4-5 with early grade 1 anterolisthesis probably secondary to facet DJD. No fracture or worrisome bone lesion. Review of the MIP images confirms the above findings. IMPRESSION: 1. Negative for acute PE or thoracic aortic dissection. 2. Fatty liver. 3.  Aortic Atherosclerosis (ICD10-170.0). Electronically Signed   By: Corlis Leak  Hassell M.D.   On: 01/26/2018 10:20   Ct Abdomen Pelvis W Contrast  Result Date: 01/26/2018 CLINICAL DATA:  Upper left cp.   Never smoker. Hx blood clots in her legs. "Pt presents with chest pain on and off for 1 year. Has been seen by Cardiologist. Flank pain for 3 weeks. Rt side great than left. Right side is constant. N/V/ without diarrhea. Chills. HX of UTI" EXAM: CT ANGIOGRAPHY CHEST CT ABDOMEN AND PELVIS WITH CONTRAST TECHNIQUE: Multidetector CT imaging of the chest was performed using the standard protocol during bolus administration of intravenous contrast. Multiplanar CT image reconstructions and MIPs were obtained to evaluate the vascular anatomy. Multidetector CT imaging of the abdomen and pelvis was performed using the standard protocol during bolus administration of intravenous contrast. CONTRAST:  100mL ISOVUE-370 IOPAMIDOL (ISOVUE-370) INJECTION 76% COMPARISON:  10/13/2017 and previous FINDINGS: CTA CHEST FINDINGS Cardiovascular: Heart size normal. No pericardial effusion. The RV is nondilated. Satisfactory opacification of pulmonary arteries noted, and there is no evidence of pulmonary emboli. Adequate contrast opacification of the thoracic aorta with no evidence of  dissection, aneurysm, or stenosis. There is variant brachiocephalic arch anatomy without proximal stenosis. The left vertebral artery arises directly from the arch. Minimal calcified atheromatous plaque in  the arch and descending thoracic segment. Mediastinum/Nodes: No hilar or mediastinal adenopathy. Lungs/Pleura: No pleural effusion. No pneumothorax. Ground-glass opacities posteriorly in both lower lobes probably dependent atelectasis. Lungs otherwise clear. Musculoskeletal: No chest wall abnormality. No acute or significant osseous findings. Review of the MIP images confirms the above findings. CT ABDOMEN and PELVIS FINDINGS Hepatobiliary: Fatty liver. No focal lesion. No biliary ductal dilatation. Gallbladder unremarkable. Pancreas: Unremarkable. No pancreatic ductal dilatation or surrounding inflammatory changes. Spleen: Normal in size without focal abnormality. Adrenals/Urinary Tract: Adrenal glands are unremarkable. Kidneys are normal, without renal calculi, focal lesion, or hydronephrosis. Bladder is unremarkable. Stomach/Bowel: Stomach is within normal limits. Appendix appears normal. No evidence of bowel wall thickening, distention, or inflammatory changes. Vascular/Lymphatic: Mild partially calcified aortoiliac atheromatous plaque without aneurysm or high-grade stenosis. No abdominal or pelvic adenopathy. Portal vein patent. Reproductive: Uterus and bilateral adnexa are unremarkable. Other: Bilateral pelvic phleboliths. No ascites. No free air. Musculoskeletal: Umbilical hardware resulting in mild streak artifact. Mild spondylitic changes L4-5 with early grade 1 anterolisthesis probably secondary to facet DJD. No fracture or worrisome bone lesion. Review of the MIP images confirms the above findings. IMPRESSION: 1. Negative for acute PE or thoracic aortic dissection. 2. Fatty liver. 3.  Aortic Atherosclerosis (ICD10-170.0). Electronically Signed   By: Corlis Leak M.D.   On: 01/26/2018 10:20     Procedures Procedures (including critical care time)  Medications Ordered in ED Medications  iopamidol (ISOVUE-370) 76 % injection (has no administration in time range)  sodium chloride (PF) 0.9 % injection (has no administration in time range)  HYDROmorphone (DILAUDID) injection 1 mg (has no administration in time range)  sodium chloride 0.9 % bolus 1,000 mL (0 mLs Intravenous Stopped 01/26/18 0911)  HYDROmorphone (DILAUDID) injection 1 mg (1 mg Intravenous Given 01/26/18 0844)  promethazine (PHENERGAN) injection 25 mg (25 mg Intravenous Given 01/26/18 0841)  famotidine (PEPCID) IVPB 20 mg premix (0 mg Intravenous Stopped 01/26/18 0912)  iopamidol (ISOVUE-370) 76 % injection 100 mL (100 mLs Intravenous Contrast Given 01/26/18 0951)     Initial Impression / Assessment and Plan / ED Course  I have reviewed the triage vital signs and the nursing notes.  Pertinent labs & imaging results that were available during my care of the patient were reviewed by me and considered in my medical decision making (see chart for details).    Nicole Hughes is a 48 y.o. female here with chest pain, R flank pain. Chest pain for a year, had previous evaluation by cardiology including normal stress test. Pain is worse with drinking and eating so suspect som reflux. However, she is in a boot for her achilles tendonitis so consider PE. Abdominal pain and R flank pain for 3 weeks. I suspect endometriosis vs renal colic. Will get labs, CTA chest, CT ab/pel, UA.   11:42 AM Labs and CTA and CT ab/pel unremarkable. UA showed no UTI. She just filled 20 vicodins as prescribed by her podiatrist so I told her that I can't prescribe any more. I suspect endometriosis. Will have her follow up with her GYN doctor.      Final Clinical Impressions(s) / ED Diagnoses   Final diagnoses:  None    ED Discharge Orders    None       Charlynne Pander, MD 01/26/18 1144

## 2018-01-26 NOTE — Telephone Encounter (Signed)
FYI to [place] at front desk. Per chart review tab pt is presently at [facility] ED.

## 2018-01-26 NOTE — ED Notes (Signed)
Patient transported to CT 

## 2018-01-26 NOTE — ED Notes (Signed)
ED Provider at bedside. YAO 

## 2018-01-26 NOTE — ED Notes (Signed)
PT ATTEMPTED TO URINATE HOWEVER WAS NOT SUCCESSFUL. SUPPLIES AT BEDSIDE

## 2018-01-26 NOTE — ED Notes (Signed)
Patient transported to X-ray 

## 2018-01-26 NOTE — ED Notes (Signed)
PT WILL TAKE HOME MEDS

## 2018-01-26 NOTE — Discharge Instructions (Addendum)
Continue taking your medicines, including your hydrocodone as prescribed by your foot doctor.   You likely have endometriosis. See your GYN doctor for follow up   Return to ER if you have worse abdominal pain, chest pain.

## 2018-01-27 ENCOUNTER — Telehealth: Payer: Self-pay

## 2018-01-27 NOTE — Telephone Encounter (Addendum)
Unable to reach pt by phone to see how pt is feeling today. Pt already has ED FU scheduled on 01/28/18.

## 2018-01-27 NOTE — Telephone Encounter (Signed)
[facility] Night - Client TELEPHONE ADVICE RECORD [facility] Patient Name: Nicole Hughes Gender: Female DOB: 01-Apr-1970 Age: 48 Y 9 M 26 D Return Phone Number: [phone] (Primary) Address: City/State/Zip: [place] [id number] Client [facility] Client [facility] - [facility] Physician Tower, Idamae SchullerMarne - MD Contact Type Call Who Is Calling Patient / Member / Family / Caregiver Call Type Triage / Clinical Relationship To Patient Self Return Phone Number [phone] (Primary) Chief Complaint CHEST PAIN (>=21 years) - pain, pressure, heaviness or tightness Reason for Call Symptomatic / Request for Health Information Initial Comment Caller states she went to the ED this morning for them to tell her not what they can do anything. She is not sleeping, she is nausea, chest pain, it is 24/7 and she is miserable. Her head is splitting hurting. She is wondering about calling something in for her. Translation No Nurse Assessment Nurse: Lorin PicketScott, RN, Elnita Maxwellheryl Date/Time (Eastern Time): 01/26/2018 5:24:39 PM Confirm and document reason for call. If symptomatic, describe symptoms. ---Caller states she was seen today in ED for chest pain, back pain, She had an angiogram done that was negative, abdomen scan, ua , EKG, and blood work and everything came back negative and they instructed they can't do anything for her. Rates chest pain 6/10, middle back pain 10/10. She is very tearful and states she does not want to go back to hospital. Has tried Tylenol PM, Aleve, Motrin without relief. Has had pain for last few weeks and past 4-5 days pain is constant and she is not sleeping and can't get comfortable. Does the patient have any new or worsening symptoms? ---Yes Will a triage be completed? ---Yes Related visit to physician within the last 2 weeks? ---Yes Does the PT have any chronic conditions? (i.e.  diabetes, asthma, this includes High risk factors for pregnancy, etc.) ---Yes List chronic conditions. ---HTN Heartburn Endometriosis Is the patient pregnant or possibly pregnant? (Ask all females between the ages of 46-55) ---No Is this a behavioral health or substance abuse call? ---No PLEASE NOTE: All timestamps contained within this report are represented as [place] Standard Time. CONFIDENTIALTY NOTICE: This fax transmission is intended only for the addressee. It contains information that is legally privileged, confidential or otherwise protected from use or disclosure. If you are not the intended recipient, you are strictly prohibited from reviewing, disclosing, copying using or disseminating any of this information or taking any action in reliance on or regarding this information. If you have received this fax in error, please notify us immediately by telephone so that we can arrange for its return to us. Phone: [phone], Toll-Free: [phone], Fax: [phone] Page: 2 of 2 Call Id: [id number] Guidelines Guideline Title Affirmed Question Affirmed Notes Nurse Date/Time Lamount Cohen(Eastern Time) Chest Pain [1] Chest pain lasts > 5 minutes AND [2] described as crushing, pressure-like, or heavy Lorin PicketScott, RElnita Maxwell, Cheryl 01/26/2018 5:29:40 PM Disp. Time Lamount Cohen(Eastern Time) Disposition Final User 01/26/2018 5:18:21 PM Send to Urgent Queue Doristine MangoBennett, Jennifer 01/26/2018 5:35:18 PM 911 Outcome Documentation Lorin PicketScott, RN, Elnita Maxwellheryl Reason: Caller refuses to call 911 or go to ED she states she was there today and they did testing and everything was negative, she will wait until she sees her MD on Thursday. 01/26/2018 5:33:56 PM Call EMS 911 Now Yes Lorin PicketScott, RN, Elizabeth Sauerheryl Caller Disagree/Comply Disagree Caller Understands Yes PreDisposition Home Care Care Advice Given Per Guideline CALL EMS 911 NOW: * Immediate medical attention is needed.  You need to hang up and call 911 (or an ambulance). IF CALLER ASKS ABOUT ASPIRIN:  * Call EMS 911 first. * If no aspirin allergy, chew an aspirin (160 to 325 mg) while waiting for the paramedics to arrive. CARE ADVICE given per Chest Pain (Adult) guideline.

## 2018-01-27 NOTE — Telephone Encounter (Signed)
I will see her at f/u

## 2018-01-28 ENCOUNTER — Ambulatory Visit: Payer: 59 | Admitting: Family Medicine

## 2018-01-28 ENCOUNTER — Telehealth: Payer: Self-pay | Admitting: *Deleted

## 2018-01-28 ENCOUNTER — Encounter: Payer: Self-pay | Admitting: Family Medicine

## 2018-01-28 VITALS — BP 156/94 | HR 98 | Temp 98.1°F | Ht 65.0 in | Wt 165.5 lb

## 2018-01-28 DIAGNOSIS — I10 Essential (primary) hypertension: Secondary | ICD-10-CM | POA: Diagnosis not present

## 2018-01-28 DIAGNOSIS — F1011 Alcohol abuse, in remission: Secondary | ICD-10-CM

## 2018-01-28 DIAGNOSIS — M7661 Achilles tendinitis, right leg: Secondary | ICD-10-CM

## 2018-01-28 DIAGNOSIS — K219 Gastro-esophageal reflux disease without esophagitis: Secondary | ICD-10-CM

## 2018-01-28 DIAGNOSIS — N803 Endometriosis of pelvic peritoneum, unspecified: Secondary | ICD-10-CM

## 2018-01-28 DIAGNOSIS — R079 Chest pain, unspecified: Secondary | ICD-10-CM | POA: Diagnosis not present

## 2018-01-28 DIAGNOSIS — R1012 Left upper quadrant pain: Secondary | ICD-10-CM

## 2018-01-28 DIAGNOSIS — F418 Other specified anxiety disorders: Secondary | ICD-10-CM

## 2018-01-28 DIAGNOSIS — M7660 Achilles tendinitis, unspecified leg: Secondary | ICD-10-CM | POA: Insufficient documentation

## 2018-01-28 MED ORDER — OMEPRAZOLE 20 MG PO CPDR: 20.0000 mg | DELAYED_RELEASE_CAPSULE | Freq: Every day | ORAL | 11 refills | Status: DC

## 2018-01-28 MED ORDER — HYDROXYZINE HCL 25 MG PO TABS: 25.0000 mg | ORAL_TABLET | Freq: Three times a day (TID) | ORAL | 1 refills | Status: DC | PRN

## 2018-01-28 NOTE — Assessment & Plan Note (Signed)
Refilled omeprazole This has been very helpful

## 2018-01-28 NOTE — Assessment & Plan Note (Signed)
Pt tried Nicole Hughes -it gave her worse anxiety  Now more flank and chest pain also  Will f/u with gyn  Considering hysterectomy (may have to have foot surgery first)

## 2018-01-28 NOTE — Progress Notes (Signed)
Subjective:    Patient ID: Nicole RossettiMargaret M Hughes, female    DOB: [birth year], 48 y.o.   MRN: [id number]  HPI Here for ED follow up 1/21-seen for flank pain and CP  CP- for a year on and off R flank pain for 3 weeks   Results for orders placed or performed during the hospital encounter of 01/26/18  CBC with Differential/Platelet  Result Value Ref Range   WBC 6.0 4.0 - 10.5 K/uL   RBC 4.31 3.87 - 5.11 MIL/uL   Hemoglobin 12.1 12.0 - 15.0 g/dL   HCT 11.937.6 14.736.0 - 82.946.0 %   MCV 87.2 80.0 - 100.0 fL   MCH 28.1 26.0 - 34.0 pg   MCHC 32.2 30.0 - 36.0 g/dL   RDW 56.214.2 13.011.5 - 86.515.5 %   Platelets 229 150 - 400 K/uL   nRBC 0.0 0.0 - 0.2 %   Neutrophils Relative % 62 %   Neutro Abs 3.8 1.7 - 7.7 K/uL   Lymphocytes Relative 26 %   Lymphs Abs 1.6 0.7 - 4.0 K/uL   Monocytes Relative 9 %   Monocytes Absolute 0.5 0.1 - 1.0 K/uL   Eosinophils Relative 1 %   Eosinophils Absolute 0.1 0.0 - 0.5 K/uL   Basophils Relative 1 %   Basophils Absolute 0.0 0.0 - 0.1 K/uL   Immature Granulocytes 1 %   Abs Immature Granulocytes 0.03 0.00 - 0.07 K/uL  Basic metabolic panel  Result Value Ref Range   Sodium 136 135 - 145 mmol/L   Potassium 3.5 3.5 - 5.1 mmol/L   Chloride 103 98 - 111 mmol/L   CO2 20 (L) 22 - 32 mmol/L   Glucose, Bld 103 (H) 70 - 99 mg/dL   BUN 7 6 - 20 mg/dL   Creatinine, Ser 7.840.54 0.44 - 1.00 mg/dL   Calcium 9.1 8.9 - 69.610.3 mg/dL   GFR calc non Af Amer >60 >60 mL/min   GFR calc Af Amer >60 >60 mL/min   Anion gap 13 5 - 15  Hepatic function panel  Result Value Ref Range   Total Protein 7.7 6.5 - 8.1 g/dL   Albumin 4.9 3.5 - 5.0 g/dL   AST 87 (H) 15 - 41 U/L   ALT 71 (H) 0 - 44 U/L   Alkaline Phosphatase 52 38 - 126 U/L   Total Bilirubin 0.7 0.3 - 1.2 mg/dL   Bilirubin, Direct 0.1 0.0 - 0.2 mg/dL   Indirect Bilirubin 0.6 0.3 - 0.9 mg/dL  Lipase, blood  Result Value Ref Range   Lipase 58 (H) 11 - 51 U/L  Urinalysis, Routine w reflex microscopic  Result Value Ref Range   Color, Urine  STRAW (A) YELLOW   APPearance CLEAR CLEAR   Specific Gravity, Urine 1.021 1.005 - 1.030   pH 7.0 5.0 - 8.0   Glucose, UA NEGATIVE NEGATIVE mg/dL   Hgb urine dipstick NEGATIVE NEGATIVE   Bilirubin Urine NEGATIVE NEGATIVE   Ketones, ur NEGATIVE NEGATIVE mg/dL   Protein, ur NEGATIVE NEGATIVE mg/dL   Nitrite NEGATIVE NEGATIVE   Leukocytes, UA NEGATIVE NEGATIVE  I-Stat Beta hCG blood, ED ([facility], [facility], [facility] only)  Result Value Ref Range   I-stat hCG, quantitative <5.0 <5 mIU/mL   Comment 3          I-stat troponin, ED  Result Value Ref Range   Troponin i, poc 0.00 0.00 - 0.08 ng/mL   Comment 3            EKG -  no significant changes   Dg Chest 2 View  Result Date: 01/26/2018 CLINICAL DATA:  48 year old female with central and left lower severe chest pain today. Hypertensive. EXAM: CHEST - 2 VIEW COMPARISON:  CTA chest 10/13/2017 and earlier. FINDINGS: Lower lung volumes on AP and lateral views today. Mediastinal contours remain normal. Visualized tracheal air column is within normal limits. No pneumothorax, pulmonary edema, pleural effusion or consolidation. Mild crowding of markings/atelectasis at the lung bases. Stable and largely negative visible osseous structures. Negative visible bowel gas pattern. IMPRESSION: Low lung volumes, otherwise negative. Electronically Signed   By: Odessa Fleming M.D.   On: 01/26/2018 09:01   Ct Angio Chest Pe W And/or Wo Contrast  Result Date: 01/26/2018 CLINICAL DATA:  Upper left cp.   Never smoker. Hx blood clots in her legs. "Pt presents with chest pain on and off for 1 year. Has been seen by Cardiologist. Flank pain for 3 weeks. Rt side great than left. Right side is constant. N/V/ without diarrhea. Chills. HX of UTI" EXAM: CT ANGIOGRAPHY CHEST CT ABDOMEN AND PELVIS WITH CONTRAST TECHNIQUE: Multidetector CT imaging of the chest was performed using the standard protocol during bolus administration of intravenous contrast. Multiplanar CT image reconstructions and MIPs were  obtained to evaluate the vascular anatomy. Multidetector CT imaging of the abdomen and pelvis was performed using the standard protocol during bolus administration of intravenous contrast. CONTRAST:  ISOVUE-370 IOPAMIDOL (ISOVUE-370) INJECTION 76% COMPARISON:  10/13/2017 and previous FINDINGS: CTA CHEST FINDINGS Cardiovascular: Heart size normal. No pericardial effusion. The RV is nondilated. Satisfactory opacification of pulmonary arteries noted, and there is no evidence of pulmonary emboli. Adequate contrast opacification of the thoracic aorta with no evidence of dissection, aneurysm, or stenosis. There is variant brachiocephalic arch anatomy without proximal stenosis. The left vertebral artery arises directly from the arch. Minimal calcified atheromatous plaque in the arch and descending thoracic segment. Mediastinum/Nodes: No hilar or mediastinal adenopathy. Lungs/Pleura: No pleural effusion. No pneumothorax. Ground-glass opacities posteriorly in both lower lobes probably dependent atelectasis. Lungs otherwise clear. Musculoskeletal: No chest wall abnormality. No acute or significant osseous findings. Review of the MIP images confirms the above findings. CT ABDOMEN and PELVIS FINDINGS Hepatobiliary: Fatty liver. No focal lesion. No biliary ductal dilatation. Gallbladder unremarkable. Pancreas: Unremarkable. No pancreatic ductal dilatation or surrounding inflammatory changes. Spleen: Normal in size without focal abnormality. Adrenals/Urinary Tract: Adrenal glands are unremarkable. Kidneys are normal, without renal calculi, focal lesion, or hydronephrosis. Bladder is unremarkable. Stomach/Bowel: Stomach is within normal limits. Appendix appears normal. No evidence of bowel wall thickening, distention, or inflammatory changes. Vascular/Lymphatic: Mild partially calcified aortoiliac atheromatous plaque without aneurysm or high-grade stenosis. No abdominal or pelvic adenopathy. Portal vein patent. Reproductive:  Uterus and bilateral adnexa are unremarkable. Other: Bilateral pelvic phleboliths. No ascites. No free air. Musculoskeletal: Umbilical hardware resulting in mild streak artifact. Mild spondylitic changes L4-5 with early grade 1 anterolisthesis probably secondary to facet DJD. No fracture or worrisome bone lesion. Review of the MIP images confirms the above findings. IMPRESSION: 1. Negative for acute PE or thoracic aortic dissection. 2. Fatty liver. 3.  Aortic Atherosclerosis (ICD10-170.0). Electronically Signed   By: Corlis Leak M.D.   On: 01/26/2018 10:20   Ct Abdomen Pelvis W Contrast  Result Date: 01/26/2018 CLINICAL DATA:  Upper left cp.   Never smoker. Hx blood clots in her legs. "Pt presents with chest pain on and off for 1 year. Has been seen by Cardiologist. Flank pain for 3 weeks. Rt side great  than left. Right side is constant. N/V/ without diarrhea. Chills. HX of UTI" EXAM: CT ANGIOGRAPHY CHEST CT ABDOMEN AND PELVIS WITH CONTRAST TECHNIQUE: Multidetector CT imaging of the chest was performed using the standard protocol during bolus administration of intravenous contrast. Multiplanar CT image reconstructions and MIPs were obtained to evaluate the vascular anatomy. Multidetector CT imaging of the abdomen and pelvis was performed using the standard protocol during bolus administration of intravenous contrast. CONTRAST:  ISOVUE-370 IOPAMIDOL (ISOVUE-370) INJECTION 76% COMPARISON:  10/13/2017 and previous FINDINGS: CTA CHEST FINDINGS Cardiovascular: Heart size normal. No pericardial effusion. The RV is nondilated. Satisfactory opacification of pulmonary arteries noted, and there is no evidence of pulmonary emboli. Adequate contrast opacification of the thoracic aorta with no evidence of dissection, aneurysm, or stenosis. There is variant brachiocephalic arch anatomy without proximal stenosis. The left vertebral artery arises directly from the arch. Minimal calcified atheromatous plaque in the arch and  descending thoracic segment. Mediastinum/Nodes: No hilar or mediastinal adenopathy. Lungs/Pleura: No pleural effusion. No pneumothorax. Ground-glass opacities posteriorly in both lower lobes probably dependent atelectasis. Lungs otherwise clear. Musculoskeletal: No chest wall abnormality. No acute or significant osseous findings. Review of the MIP images confirms the above findings. CT ABDOMEN and PELVIS FINDINGS Hepatobiliary: Fatty liver. No focal lesion. No biliary ductal dilatation. Gallbladder unremarkable. Pancreas: Unremarkable. No pancreatic ductal dilatation or surrounding inflammatory changes. Spleen: Normal in size without focal abnormality. Adrenals/Urinary Tract: Adrenal glands are unremarkable. Kidneys are normal, without renal calculi, focal lesion, or hydronephrosis. Bladder is unremarkable. Stomach/Bowel: Stomach is within normal limits. Appendix appears normal. No evidence of bowel wall thickening, distention, or inflammatory changes. Vascular/Lymphatic: Mild partially calcified aortoiliac atheromatous plaque without aneurysm or high-grade stenosis. No abdominal or pelvic adenopathy. Portal vein patent. Reproductive: Uterus and bilateral adnexa are unremarkable. Other: Bilateral pelvic phleboliths. No ascites. No free air. Musculoskeletal: Umbilical hardware resulting in mild streak artifact. Mild spondylitic changes L4-5 with early grade 1 anterolisthesis probably secondary to facet DJD. No fracture or worrisome bone lesion. Review of the MIP images confirms the above findings. IMPRESSION: 1. Negative for acute PE or thoracic aortic dissection. 2. Fatty liver. 3.  Aortic Atherosclerosis (ICD10-170.0). Electronically Signed   By: Corlis Leak M.D.   On: 01/26/2018 10:20   Reassuring cxr and CT Noted fatty liver and aortic atherosclerosis   In ED was given phenergan and papcid and dilaudid   A/P as follows HYDI BATCH is a 48 y.o. female here with chest pain, R flank pain. Chest pain for  a year, had previous evaluation by cardiology including normal stress test. Pain is worse with drinking and eating so suspect som reflux. However, she is in a boot for her achilles tendonitis so consider PE. Abdominal pain and R flank pain for 3 weeks. I suspect endometriosis vs renal colic. Will get labs, CTA chest, CT ab/pel, UA.   11:42 AM Labs and CTA and CT ab/pel unremarkable. UA showed no UTI. She just filled 20 vicodins as prescribed by her podiatrist so I told her that I can't prescribe any more. I suspect endometriosis. Will have her follow up with her GYN doctor.   Today Wt Readings from Last 3 Encounters:  01/28/18 165 lb 8 oz (75.1 kg)  01/26/18 165 lb (74.8 kg)  10/13/17 156 lb 15.5 oz (71.2 kg)   27.54 kg/m    GERD-taking omeprazole  etoh intake  BP Readings from Last 3 Encounters:  01/28/18 (!) 156/94  01/26/18 (!) 157/91  10/13/17 (!) 149/97  Pulse Readings from Last 3 Encounters:  01/28/18 98  01/26/18 88  10/13/17 (!) 114   Taking amlodipine and lisinopril   Having issues with achilles tendonitis - may have to have surgery on RLE   Since she stopped drinking  She feels keyed up all the time and does not sleep well  Hydroxyzine helps-needs a refill   Is back to [employer]- that is going well and has a sponsor that comes to the house  Last drink was 12/25   Cannot afford to go to psychiatry   Has been back to gyn  Started on orlissa in oct- it made her anxiety worse  Goes back in Feb  Talking about a hysterectomy   Has a mammogram scheduled as well for Tuesday- has had cysts in breast   Continues to drink lots of fluids   Other than health problems things are going great  Has a 25 y old grandson and her father is doing well    Patient Active Problem List   Diagnosis Date Noted  . Achilles tendonitis 01/28/2018  . Abnormal cervical Papanicolaou smear 09/29/2017  . Insomnia 09/29/2017  . Pain 09/29/2017  . Palpitations 05/25/2017  . Chest  wall pain 05/25/2017  . Elevated transaminase level 05/25/2017  . Depression with anxiety 02/23/2017  . 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 Hyponatremia 02/17/2017  . Hypokalemia 09/24/2016  . H/O alcohol abuse 09/22/2016  . H/O acute pancreatitis 09/22/2016  . GERD (gastroesophageal reflux disease) 04/09/2015  . 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 Chest pain 08/20/2012  . Stress reaction 07/23/2012  . Essential hypertension 06/09/2012  . History of pulmonary embolism 06/09/2012  . Preop cardiovascular exam 06/08/2012  . Dysmenorrhea   . Dyspareunia   . Pelvic pain    Past Medical History:  Diagnosis Date  . Anxiety   . Cardiomyopathy (HCC)   . Chronic lower back pain   . Complication of anesthesia    "hard to get under"  . Cyst of ovary   . Depression    hx  . DVT complicating pregnancy 08/1998   RLE; RUE  . Dysmenorrhea   . Dyspareunia   . 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 GERD (gastroesophageal reflux disease)   . Hay fever    "fall and spring" (02/17/2017)  . Headache    "2-3 times/.wik" (02/17/2017)  . History of chicken pox   . Hypertension   . Migraine    "monthly" (02/17/2017)  . Pelvic pain   . Pulmonary embolism (HCC) 08/1998   S/P childbirth   Past Surgical History:  Procedure Laterality Date  . CESAREAN SECTION  1994  . ECTOPIC PREGNANCY SURGERY  2010  . LAPAROSCOPY N/A 09/24/2012   Procedure: OPERATIVE LAPROSCOPY WITH LYSIS OF ADHESIONS;  Surgeon: Sherron Monday, MD;  Location: [facility];  Service: Gynecology;  Laterality: N/A;  . TONSILLECTOMY  1984  . TUBAL LIGATION  2010   Social History   Tobacco Use  . Smoking status: Never Smoker  . Smokeless tobacco: Never Used  Substance Use Topics  . Alcohol use: Not Currently    Alcohol/week: 5.0 standard drinks    Types: 5 Cans of beer per week    Frequency: Never    Comment: has quit (March 2019)  . Drug use: No   Family History  Problem Relation Age of Onset  . Cancer - Lung  Mother   . Heart failure Father   . Hypertension Father    No Known Allergies Current  Outpatient Medications on File Prior to Visit  Medication Sig Dispense Refill  . amLODipine (NORVASC) 5 MG tablet TAKE 1 TABLET BY MOUTH ONCE DAILY (Patient taking differently: Take 5 mg by mouth daily. ) 90 tablet 1  . HYDROcodone-acetaminophen (NORCO) 5-325 MG tablet Take 1 tablet by mouth every 6 (six) hours as needed for moderate pain. 20 tablet 0  . ibuprofen (ADVIL,MOTRIN) 200 MG tablet Take 400 mg by mouth every 6 (six) hours as needed for headache or mild pain.    Marland Kitchen lisinopril (PRINIVIL,ZESTRIL) 10 MG tablet TAKE 1 TABLET BY MOUTH ONCE DAILY (Patient taking differently: Take 10 mg by mouth daily. ) 90 tablet 1  . metoprolol succinate (TOPROL XL) 25 MG 24 hr tablet Take 1 tablet (25 mg total) by mouth daily. 90 tablet 3  . Multiple Vitamin (MULTIVITAMIN WITH MINERALS) TABS tablet Take 1 tablet by mouth daily. 30 tablet 0   No current facility-administered medications on file prior to visit.     Review of Systems  Constitutional: Positive for fatigue. Negative for activity change, appetite change, fever and unexpected weight change.  HENT: Negative for congestion, ear pain, rhinorrhea, sinus pressure and sore throat.   Eyes: Negative for pain, redness and visual disturbance.  Respiratory: Negative for cough, shortness of breath and wheezing.   Cardiovascular: Positive for chest pain and palpitations. Negative for leg swelling.  Gastrointestinal: Positive for abdominal pain and nausea. Negative for abdominal distention, blood in stool, constipation, diarrhea and vomiting.  Endocrine: Negative for polydipsia and polyuria.  Genitourinary: Positive for flank pain, menstrual problem and pelvic pain. Negative for dysuria, frequency and urgency.  Musculoskeletal: Positive for arthralgias and back pain. Negative for myalgias.  Skin: Negative for pallor and rash.  Allergic/Immunologic: Negative for  environmental allergies.  Neurological: Negative for dizziness, syncope and headaches.  Hematological: Negative for adenopathy. Does not bruise/bleed easily.  Psychiatric/Behavioral: Positive for sleep disturbance. Negative for decreased concentration, dysphoric mood, self-injury and suicidal ideas. The patient is nervous/anxious.        Objective:   Physical Exam Constitutional:      General: She is not in acute distress.    Appearance: She is well-developed and normal weight. She is not ill-appearing.     Comments: Very anxious   HENT:     Head: Normocephalic and atraumatic.     Mouth/Throat:     Mouth: Mucous membranes are moist.     Pharynx: Oropharynx is clear.  Eyes:     Conjunctiva/sclera: Conjunctivae normal.     Pupils: Pupils are equal, round, and reactive to light.  Neck:     Musculoskeletal: Normal range of motion and neck supple.     Thyroid: No thyromegaly.     Vascular: No carotid bruit or JVD.  Cardiovascular:     Rate and Rhythm: Normal rate and regular rhythm.     Pulses: Normal pulses.     Heart sounds: Normal heart sounds. No murmur. No gallop.   Pulmonary:     Effort: Pulmonary effort is normal. No respiratory distress.     Breath sounds: Normal breath sounds. No wheezing or rales.  Abdominal:     General: Bowel sounds are normal. There is no distension or abdominal bruit.     Palpations: Abdomen is soft. There is no mass.     Tenderness: There is no abdominal tenderness.  Musculoskeletal:        General: No swelling.     Comments: Boot on R foot  No leg edema  Lymphadenopathy:     Cervical: No cervical adenopathy.  Skin:    General: Skin is warm and dry.     Capillary Refill: Capillary refill takes less than 2 seconds.     Coloration: Skin is not jaundiced or pale.     Findings: No erythema, lesion or rash.  Neurological:     Mental Status: She is alert. Mental status is at baseline.     Motor: Tremor present. No weakness.     Gait: Gait  normal.     Deep Tendon Reflexes: Reflexes are normal and symmetric.  Psychiatric:        Attention and Perception: She does not perceive auditory or visual hallucinations.        Mood and Affect: Mood is anxious.        Thought Content: Thought content is not paranoid. Thought content does not include homicidal or suicidal ideation.        Cognition and Memory: Cognition normal.     Comments: Very anxious with tremor  Pleasant and talkative Candid about stressors and symptoms            Assessment & Plan:   Problem List Items Addressed This Visit      Cardiovascular and Mediastinum   Essential hypertension    bp has been chronically elevated with pts pain and severe anxiety (especially after stopping drinking)  We will refill her hydroxyzine (which helps) and have her return in 1 mo for re check when more calm (also pulse) BP: (!) 156/94    Pulse Rate: 98          Digestive   GERD (gastroesophageal reflux disease)    Refilled omeprazole This has been very helpful      Relevant Medications   omeprazole (PRILOSEC) 20 MG capsule     Musculoskeletal and Integument   Achilles tendonitis    Currently in a boot  May have to have surgery          Other   Chest pain - Primary    This is ongoing Reviewed hospital records, lab results and studies in detail   Very reassuring labs and imaging incl CT scan  Has mammogram upcoming ? Wonder if endometriosis could play a role (rare inv in the chest but I have seen it)  She will return to gyn Has seen cardiology already Anxiety control should also work      Endometriosis of pelvic peritoneum    Pt tried Pollie Friarrlissa -it gave her worse anxiety  Now more flank and chest pain also  Will f/u with gyn  Considering hysterectomy (may have to have foot surgery first)        H/O alcohol abuse    Doing better  Much more anxious since quitting etoh in December  Going to AA Staying hydrated Better self care Enc to keep it  up  Cannot afford mental health care presently      Depression with anxiety    Outside of health issues stressors are much improved  She has quit drinking since xmas and going to AA Reviewed stressors/ coping techniques/symptoms/ support sources/ tx options and side effects in detail today Depression is much imp but anxiety is still mod to severe  Hydroxyzine helps this and nausea  Refilled today with caution of sedation  Will continue to follow Cannot afford psychiatry appt at this time Enc her to continue AA meetings as well       Relevant Medications   hydrOXYzine (ATARAX/VISTARIL)  25 MG tablet    Other Visit Diagnoses    Abdominal pain, left upper quadrant       Relevant Medications   omeprazole (PRILOSEC) 20 MG capsule

## 2018-01-28 NOTE — Assessment & Plan Note (Signed)
bp has been chronically elevated with pts pain and severe anxiety (especially after stopping drinking)  We will refill her hydroxyzine (which helps) and have her return in 1 mo for re check when more calm (also pulse) BP: (!) 156/94    Pulse Rate: 98

## 2018-01-28 NOTE — Telephone Encounter (Signed)
Spoke to pt who states she attended Ov with Dr Milinda Antis this am and discussed her muscle spasms. She states she forgot to ask for some medication, and is wanting to know if she can have a muscle relaxer sent to [facility]. pls advise

## 2018-01-28 NOTE — Assessment & Plan Note (Addendum)
This is ongoing Reviewed hospital records, lab results and studies in detail   Very reassuring labs and imaging incl CT scan  Has mammogram upcoming ? Wonder if endometriosis could play a role (rare inv in the chest but I have seen it)  She will return to gyn Has seen cardiology already Anxiety control should also work

## 2018-01-28 NOTE — Patient Instructions (Signed)
Continue going to AA meetings Stay hydrated  Try the hydroxyzine for anxiety   Continue omeprazole for acid reflux   Take care of yourself   Follow up in about a month for blood pressure please (it is high due to anxiety today)

## 2018-01-28 NOTE — Assessment & Plan Note (Signed)
Doing better  Much more anxious since quitting etoh in December  Going to AA Staying hydrated Better self care Enc to keep it up  Cannot afford mental health care presently

## 2018-01-28 NOTE — Assessment & Plan Note (Signed)
Outside of health issues stressors are much improved  She has quit drinking since xmas and going to AA Reviewed stressors/ coping techniques/symptoms/ support sources/ tx options and side effects in detail today Depression is much imp but anxiety is still mod to severe  Hydroxyzine helps this and nausea  Refilled today with caution of sedation  Will continue to follow Cannot afford psychiatry appt at this time Enc her to continue AA meetings as well

## 2018-01-28 NOTE — Assessment & Plan Note (Signed)
Currently in a boot  May have to have surgery

## 2018-01-29 NOTE — Telephone Encounter (Signed)
I think it is fairly rare- but had a patient with problems of that nature once -which is why I brought it up  Please send a copy of my office note to gyn  Thanks

## 2018-01-29 NOTE — Telephone Encounter (Signed)
OV and this phone note routed to GYN in [facility]

## 2018-01-29 NOTE — Telephone Encounter (Signed)
The hydroxyzine may help with her muscle spasms -please start with that (apprehensive to add anything also due to sedation potential)  Keep me posted

## 2018-01-29 NOTE — Telephone Encounter (Addendum)
Pt advise of Dr. Royden Purl comments and verbalized understanding. She will keep [place] posted. Pt did want me to let Dr. Milinda Antis know that she did call her GYN and they "dismissed her" when she told them that you requested them to look into the possibility of her having endometriosis in her chest cavity, she said her GYN said they never heard of this and it isn't the cause of her chest pain. Pt does have a f/u with them soon and wanted a letter or info on what Dr. Milinda Antis was saying regarding this and I advise her that they should be able to look at Dr. Royden Purl office note through care everywhere and see Dr. Royden Purl comment regarding this. Pt said she will have them look at it but just wanted to give DR. Tower a heads up on what GYN said

## 2018-02-01 ENCOUNTER — Ambulatory Visit: Payer: 59 | Admitting: Family Medicine

## 2018-02-02 ENCOUNTER — Ambulatory Visit
Admission: RE | Admit: 2018-02-02 | Discharge: 2018-02-02 | Disposition: A | Discharge status: Home / Self Care | Payer: 59 | Source: Ambulatory Visit | Attending: Obstetrics and Gynecology | Admitting: Obstetrics and Gynecology

## 2018-02-02 DIAGNOSIS — R928 Other abnormal and inconclusive findings on diagnostic imaging of breast: Secondary | ICD-10-CM | POA: Diagnosis not present

## 2018-02-02 DIAGNOSIS — N6012 Diffuse cystic mastopathy of left breast: Secondary | ICD-10-CM | POA: Diagnosis not present

## 2018-02-02 DIAGNOSIS — N6002 Solitary cyst of left breast: Secondary | ICD-10-CM

## 2018-02-16 ENCOUNTER — Encounter: Payer: Self-pay | Admitting: Sports Medicine

## 2018-02-16 ENCOUNTER — Ambulatory Visit: Payer: 59 | Admitting: Sports Medicine

## 2018-02-16 DIAGNOSIS — M7661 Achilles tendinitis, right leg: Secondary | ICD-10-CM | POA: Diagnosis not present

## 2018-02-16 DIAGNOSIS — M7662 Achilles tendinitis, left leg: Secondary | ICD-10-CM

## 2018-02-16 DIAGNOSIS — M216X1 Other acquired deformities of right foot: Secondary | ICD-10-CM

## 2018-02-16 DIAGNOSIS — M79673 Pain in unspecified foot: Secondary | ICD-10-CM

## 2018-02-16 MED ORDER — TRIAMCINOLONE ACETONIDE 10 MG/ML IJ SUSP
10.0000 mg | Freq: Once | INTRAMUSCULAR | Status: AC
  Administered 2018-02-16: 10 mg

## 2018-02-16 MED ORDER — HYDROCODONE-ACETAMINOPHEN 5-325 MG PO TABS: 1.0000 | ORAL_TABLET | Freq: Four times a day (QID) | ORAL | 0 refills | Status: DC | PRN

## 2018-02-16 NOTE — Progress Notes (Signed)
Subjective: Nicole Hughes is a 48 y.o. female patient who returns to office for evaluation of Right>Left heel pain. Patient complains of continued pain especially at the Achilles that is throbbing in nature on left 0/10 feels 100% better whereas the right is worse still significantly painful 7 out of 10 however reports that her cam boot offer some relief and her pain medicine offer some relief.  Patient states that she wants to try injection today for relief. Has taken this week off. 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   Patient Active Problem List   Diagnosis Date Noted  . Achilles tendonitis 01/28/2018  . Abnormal cervical Papanicolaou smear 09/29/2017  . Insomnia 09/29/2017  . Pain 09/29/2017  . Palpitations 05/25/2017  . Chest wall pain 05/25/2017  . Elevated transaminase level 05/25/2017  . Depression with anxiety 02/23/2017  . 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 Hyponatremia 02/17/2017  . Hypokalemia 09/24/2016  . H/O alcohol abuse 09/22/2016  . H/O acute pancreatitis 09/22/2016  . GERD (gastroesophageal reflux disease) 04/09/2015  . 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 Chest pain 08/20/2012  . Stress reaction 07/23/2012  . Essential hypertension 06/09/2012  . History of pulmonary embolism 06/09/2012  . Preop cardiovascular exam 06/08/2012  . Dysmenorrhea   . Dyspareunia   . Pelvic pain     Current Outpatient Medications on File Prior to Visit  Medication Sig Dispense Refill  . amLODipine (NORVASC) 5 MG tablet TAKE 1 TABLET BY MOUTH ONCE DAILY (Patient taking differently: Take 5 mg by mouth daily. ) 90 tablet 1  . hydrOXYzine (ATARAX/VISTARIL) 25 MG tablet Take 1 tablet (25 mg total) by mouth every 8 (eight) hours as needed for anxiety or nausea. 90 tablet 1  . ibuprofen (ADVIL,MOTRIN) 200 MG tablet Take 400 mg by mouth every 6 (six) hours as needed for headache or mild pain.    Marland Kitchen lisinopril (PRINIVIL,ZESTRIL) 10 MG tablet TAKE 1 TABLET BY MOUTH ONCE  DAILY (Patient taking differently: Take 10 mg by mouth daily. ) 90 tablet 1  . metoprolol succinate (TOPROL XL) 25 MG 24 hr tablet Take 1 tablet (25 mg total) by mouth daily. 90 tablet 3  . Multiple Vitamin (MULTIVITAMIN WITH MINERALS) TABS tablet Take 1 tablet by mouth daily. 30 tablet 0  . omeprazole (PRILOSEC) 20 MG capsule Take 1 capsule (20 mg total) by mouth daily. 30 capsule 11   No current facility-administered medications on file prior to visit.     No Known Allergies  Objective:  General: Alert and oriented x3 in no acute distress  Dermatology: No open lesions bilateral lower extremities, no webspace macerations, no ecchymosis bilateral, all nails x 10 are well manicured.  Vascular: Dorsalis Pedis and Posterior Tibial pedal pulses 2/4, Capillary Fill Time 3 seconds, + pedal hair growth bilateral, no edema bilateral lower extremities, Temperature gradient within normal limits.  Neurology: Nicole Hughes sensation intact via light touch bilateral.  Musculoskeletal: Moderate tenderness with palpation at insertion of the Achilles on Right> and no tenderness on Left, there is calcaneal exostosis with moderate soft tissue swelling present and decreased ankle rom with knee extending  vs flexed resembling gastroc equnius bilateral, The achilles tendon feels intact with no nodularity or palpable dell, Thompson sign negative. Subjective pain sometimes to ankle on right like before with difficulty walking or standing, boot helps like before.   Gait: Antalgic gait   Assessment and Plan: Problem List Items Addressed This Visit    None    Visit Diagnoses  Tendonitis, Achilles, right    -  Primary   Inflammatory heel pain, unspecified laterality       Relevant Medications   HYDROcodone-acetaminophen (NORCO) 5-325 MG tablet   Acquired equinus deformity of right foot       Achilles tendonitis, bilateral       Right still symptomatic Left currently asymptomatic   Relevant Medications    HYDROcodone-acetaminophen (NORCO) 5-325 MG tablet   Inflammatory heel pain, unspecified laterality       Right   Relevant Medications   HYDROcodone-acetaminophen (NORCO) 5-325 MG tablet     -Complete examination performed -Re-Discussed treatement options for bilateral tendonitis at achilles with the right side still being symptomatic -After oral consent and aseptic prep, injected a mixture containing 1 ml of 2%  plain lidocaine, 1 ml 0.5% plain marcaine, 0.5 ml of kenalog 10 and 0.5 ml of dexamethasone phosphate along right achilles lateral aspect without complication. Post-injection care discussed with patient.  -Refilled Norco -To start PT in 2 weeks  -Continue with Right CAM boot -No improvement will consider Surgery  -Patient to return to office in 1 month or sooner if condition worsens.  Nicole Hughes, DPM

## 2018-03-01 ENCOUNTER — Ambulatory Visit: Payer: 59 | Admitting: Family Medicine

## 2018-03-01 ENCOUNTER — Encounter: Payer: Self-pay | Admitting: Family Medicine

## 2018-03-01 VITALS — BP 162/84 | HR 78 | Temp 98.0°F | Ht 65.0 in | Wt 164.5 lb

## 2018-03-01 DIAGNOSIS — I10 Essential (primary) hypertension: Secondary | ICD-10-CM

## 2018-03-01 DIAGNOSIS — F418 Other specified anxiety disorders: Secondary | ICD-10-CM | POA: Diagnosis not present

## 2018-03-01 DIAGNOSIS — R0789 Other chest pain: Secondary | ICD-10-CM

## 2018-03-01 DIAGNOSIS — R002 Palpitations: Secondary | ICD-10-CM

## 2018-03-01 DIAGNOSIS — M7661 Achilles tendinitis, right leg: Secondary | ICD-10-CM

## 2018-03-01 MED ORDER — METOPROLOL SUCCINATE ER 50 MG PO TB24: 50.0000 mg | ORAL_TABLET | Freq: Every day | ORAL | 3 refills | Status: DC

## 2018-03-01 MED ORDER — SERTRALINE HCL 50 MG PO TABS: 50.0000 mg | ORAL_TABLET | Freq: Every day | ORAL | 11 refills | Status: DC

## 2018-03-01 NOTE — Patient Instructions (Addendum)
Increase your metoprolol xl to 50 mg daily (you are taking 25 mg now)  This is for blood pressure and also palpitations  Follow up around 1 month (or when you can after surgery)  If any side effects or problems stop it and let me knkow   Try zoloft for anxiety  Start with 1/2 pill each evening for 1 week and if doing well then increase to a whole pill daily  If anxiety or depression worsen or you feel suicidal-stop the medicine and let us know  Also if any intolerable side effects   Try to take care of yourself  Good luck with your ankle/foot surgery

## 2018-03-01 NOTE — Assessment & Plan Note (Signed)
In PT and planning surgery soon

## 2018-03-01 NOTE — Assessment & Plan Note (Signed)
Ongoing  Tenderness noted Otherwise nl exam

## 2018-03-01 NOTE — Progress Notes (Signed)
Subjective:    Patient ID: Nicole Hughes, female    DOB: 1970-05-30, 48 y.o.   MRN: [id number]  HPI  Here for f/u of chornic health problems   Wt Readings from Last 3 Encounters:  03/01/18 164 lb 8 oz (74.6 kg)  01/28/18 165 lb 8 oz (75.1 kg)  01/26/18 165 lb (74.8 kg)   27.37 kg/m    Sees Dr Cannon Kettle DPM for heel pain- given injection this mo / also refilled norco and ordered PT  Will schedule surgery for achilles tendonitis  Then plans to schedule her hysterectomy for endometriosis   Chest pain /chronic with palpitations Has been worked up by cardiology  Tries to stop and deep breathe and it helps  Can't sleep   Anxiety -last visit refilled hydroxyzine for this and also nausea  Not helping like it did before (now w/o etoh in her system) Feels like a zombie  Unfortunately needs to see psychiatry and cannot afford to  Thinks she tried paxil in the past- did not help  ? If she tried zoloft  Has been on many medications  May be able to afford psychiatry now that she has met her deductable    Elevated blood pressure -last visit elevated in setting of pain an severe anxiety  BP Readings from Last 3 Encounters:  03/01/18 (!) 162/84  01/28/18 (!) 156/94  01/26/18 (!) 157/91  takes amlodipine 5 mg  Lisinopril 10 mg  toprol xl 25 mg once daily  Pulse Readings from Last 3 Encounters:  03/01/18 78  01/28/18 98  01/26/18 88   Endometriosis-planning hysterectomy   Alcohol -still none ! And still going to [employer]  Spending time with her grandson  Her father is doing better  Things overall are going better    Patient Active Problem List   Diagnosis Date Noted  . Achilles tendonitis 01/28/2018  . Abnormal cervical Papanicolaou smear 09/29/2017  . Insomnia 09/29/2017  . Pain 09/29/2017  . Palpitations 05/25/2017  . Chest wall pain 05/25/2017  . Elevated transaminase level 05/25/2017  . Depression with anxiety 02/23/2017  . 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  Hyponatremia 02/17/2017  . Hypokalemia 09/24/2016  . H/O alcohol abuse 09/22/2016  . H/O acute pancreatitis 09/22/2016  . GERD (gastroesophageal reflux disease) 04/09/2015  . 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 Chest pain 08/20/2012  . Stress reaction 07/23/2012  . Essential hypertension 06/09/2012  . History of pulmonary embolism 06/09/2012  . Preop cardiovascular exam 06/08/2012  . Dysmenorrhea   . Dyspareunia   . Pelvic pain    Past Medical History:  Diagnosis Date  . Anxiety   . Cardiomyopathy ([place])   . Chronic lower back pain   . Complication of anesthesia    "hard to get under"  . Cyst of ovary   . Depression    hx  . DVT complicating pregnancy 98/9211   RLE; RUE  . Dysmenorrhea   . Dyspareunia   . 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 GERD (gastroesophageal reflux disease)   . Hay fever    "fall and spring" (02/17/2017)  . Headache    "2-3 times/.wik" (02/17/2017)  . History of chicken pox   . Hypertension   . Migraine    "monthly" (02/17/2017)  . Pelvic pain   . Pulmonary embolism ([place]) 08/1998   S/P childbirth   Past Surgical History:  Procedure Laterality Date  . CESAREAN SECTION  1994  . ECTOPIC PREGNANCY SURGERY  2010  .  LAPAROSCOPY N/A 09/24/2012   Procedure: OPERATIVE LAPROSCOPY WITH LYSIS OF ADHESIONS;  Surgeon: Thornell Sartorius, MD;  Location: [facility];  Service: Gynecology;  Laterality: N/A;  . TONSILLECTOMY  1984  . TUBAL LIGATION  2010   Social History   Tobacco Use  . Smoking status: Never Smoker  . Smokeless tobacco: Never Used  Substance Use Topics  . Alcohol use: Not Currently    Alcohol/week: 5.0 standard drinks    Types: 5 Cans of beer per week    Frequency: Never    Comment: has quit (March 2019)  . Drug use: No   Family History  Problem Relation Age of Onset  . Cancer - Lung Mother   . Heart failure Father   . Hypertension Father    No Known Allergies Current Outpatient Medications on File Prior to Visit    Medication Sig Dispense Refill  . amLODipine (NORVASC) 5 MG tablet TAKE 1 TABLET BY MOUTH ONCE DAILY (Patient taking differently: Take 5 mg by mouth daily. ) 90 tablet 1  . HYDROcodone-acetaminophen (NORCO) 5-325 MG tablet Take 1 tablet by mouth every 6 (six) hours as needed for moderate pain. 20 tablet 0  . hydrOXYzine (ATARAX/VISTARIL) 25 MG tablet Take 1 tablet (25 mg total) by mouth every 8 (eight) hours as needed for anxiety or nausea. 90 tablet 1  . ibuprofen (ADVIL,MOTRIN) 200 MG tablet Take 400 mg by mouth every 6 (six) hours as needed for headache or mild pain.    Marland Kitchen lisinopril (PRINIVIL,ZESTRIL) 10 MG tablet TAKE 1 TABLET BY MOUTH ONCE DAILY (Patient taking differently: Take 10 mg by mouth daily. ) 90 tablet 1  . Multiple Vitamin (MULTIVITAMIN WITH MINERALS) TABS tablet Take 1 tablet by mouth daily. 30 tablet 0  . omeprazole (PRILOSEC) 20 MG capsule Take 1 capsule (20 mg total) by mouth daily. 30 capsule 11   No current facility-administered medications on file prior to visit.     Review of Systems  Constitutional: Positive for fatigue. Negative for activity change, appetite change, fever and unexpected weight change.  HENT: Negative for congestion, ear pain, rhinorrhea, sinus pressure and sore throat.   Eyes: Negative for pain, redness and visual disturbance.  Respiratory: Negative for cough, shortness of breath and wheezing.   Cardiovascular: Positive for chest pain. Negative for palpitations.       Chest wall pain -chronic   Gastrointestinal: Negative for abdominal pain, blood in stool, constipation and diarrhea.  Endocrine: Negative for polydipsia and polyuria.  Genitourinary: Positive for pelvic pain. Negative for dysuria, frequency and urgency.       Chronic pelvic pain due to endometriosis   Musculoskeletal: Positive for arthralgias and back pain. Negative for myalgias.  Skin: Negative for pallor and rash.  Allergic/Immunologic: Negative for environmental allergies.   Neurological: Negative for dizziness, syncope and headaches.  Hematological: Negative for adenopathy. Does not bruise/bleed easily.  Psychiatric/Behavioral: Positive for dysphoric mood and sleep disturbance. Negative for decreased concentration and suicidal ideas. The patient is nervous/anxious.        Objective:   Physical Exam Constitutional:      General: She is not in acute distress.    Appearance: Normal appearance. She is well-developed and normal weight. She is not ill-appearing or diaphoretic.  HENT:     Head: Normocephalic and atraumatic.     Mouth/Throat:     Mouth: Mucous membranes are moist.     Pharynx: Oropharynx is clear.  Eyes:     Conjunctiva/sclera: Conjunctivae normal.  Pupils: Pupils are equal, round, and reactive to light.  Neck:     Musculoskeletal: Normal range of motion and neck supple.     Thyroid: No thyromegaly.     Vascular: No carotid bruit or JVD.  Cardiovascular:     Rate and Rhythm: Normal rate and regular rhythm.     Pulses: Normal pulses.     Heart sounds: Normal heart sounds. No gallop.   Pulmonary:     Effort: Pulmonary effort is normal. No respiratory distress.     Breath sounds: Normal breath sounds. No wheezing or rales.  Abdominal:     General: Bowel sounds are normal. There is no distension or abdominal bruit.     Palpations: Abdomen is soft. There is no mass.     Tenderness: There is no abdominal tenderness.  Musculoskeletal:     Right lower leg: No edema.     Left lower leg: No edema.  Lymphadenopathy:     Cervical: No cervical adenopathy.  Skin:    General: Skin is warm and dry.     Coloration: Skin is not pale.     Findings: No rash.  Neurological:     Mental Status: She is alert. Mental status is at baseline.     Coordination: Coordination normal.     Deep Tendon Reflexes: Reflexes are normal and symmetric. Reflexes normal.     Comments: No tremor  Psychiatric:        Mood and Affect: Mood is anxious.        Speech:  Speech normal.        Behavior: Behavior normal.        Thought Content: Thought content normal.           Assessment & Plan:   Problem List Items Addressed This Visit      Cardiovascular and Mediastinum   Essential hypertension - Primary    bp continues to be elevated BP: (!) 162/84    Will increase metoprolol to 50 mg XL  Alert if side effects/dizziness or slow pulse  F/u about a month Work on lifestyle change       Relevant Medications   metoprolol succinate (TOPROL-XL) 50 MG 24 hr tablet     Musculoskeletal and Integument   Achilles tendonitis    In PT and planning surgery soon        Other   Depression with anxiety    Ongoing- many meds in the past  Unable to financially see psychiatry yet  Will try zoloft 50 mg (start wit 25 and titrate up) Discussed expectations of SSRI medication including time to effectiveness and mechanism of action, also poss of side effects (early and late)- including mental fuzziness, weight or appetite change, nausea and poss of worse dep or anxiety (even suicidal thoughts)  Pt voiced understanding and will stop med and update if this occurs   Reviewed stressors/ coping techniques/symptoms/ support sources/ tx options and side effects in detail today  F/u in about a mo      Relevant Medications   sertraline (ZOLOFT) 50 MG tablet   Palpitations    Increase metoprolol xl to 50 mg for this and also HTN Rev prev cardiac w/u  May also be anxiety related       Chest wall pain    Ongoing  Tenderness noted Otherwise nl exam

## 2018-03-01 NOTE — Assessment & Plan Note (Signed)
bp continues to be elevated BP: (!) 162/84    Will increase metoprolol to 50 mg XL  Alert if side effects/dizziness or slow pulse  F/u about a month Work on lifestyle change

## 2018-03-01 NOTE — Assessment & Plan Note (Signed)
Increase metoprolol xl to 50 mg for this and also HTN Rev prev cardiac w/u  May also be anxiety related

## 2018-03-01 NOTE — Assessment & Plan Note (Signed)
Ongoing- many meds in the past  Unable to financially see psychiatry yet  Will try zoloft 50 mg (start wit 25 and titrate up) Discussed expectations of SSRI medication including time to effectiveness and mechanism of action, also poss of side effects (early and late)- including mental fuzziness, weight or appetite change, nausea and poss of worse dep or anxiety (even suicidal thoughts)  Pt voiced understanding and will stop med and update if this occurs   Reviewed stressors/ coping techniques/symptoms/ support sources/ tx options and side effects in detail today  F/u in about a mo

## 2018-03-02 ENCOUNTER — Observation Stay (HOSPITAL_COMMUNITY)
Admission: EM | Admit: 2018-03-02 | Discharge: 2018-03-05 | Disposition: A | Discharge status: Home / Self Care | Payer: 59 | Attending: Internal Medicine | Admitting: Internal Medicine

## 2018-03-02 ENCOUNTER — Other Ambulatory Visit: Payer: Self-pay

## 2018-03-02 ENCOUNTER — Encounter (HOSPITAL_COMMUNITY): Payer: Self-pay | Admitting: Emergency Medicine

## 2018-03-02 ENCOUNTER — Emergency Department (HOSPITAL_COMMUNITY): Payer: 59

## 2018-03-02 DIAGNOSIS — R109 Unspecified abdominal pain: Secondary | ICD-10-CM | POA: Diagnosis not present

## 2018-03-02 DIAGNOSIS — R945 Abnormal results of liver function studies: Secondary | ICD-10-CM | POA: Diagnosis not present

## 2018-03-02 DIAGNOSIS — R1011 Right upper quadrant pain: Secondary | ICD-10-CM | POA: Diagnosis not present

## 2018-03-02 DIAGNOSIS — F1011 Alcohol abuse, in remission: Secondary | ICD-10-CM | POA: Diagnosis not present

## 2018-03-02 DIAGNOSIS — F329 Major depressive disorder, single episode, unspecified: Secondary | ICD-10-CM | POA: Insufficient documentation

## 2018-03-02 DIAGNOSIS — R740 Nonspecific elevation of levels of transaminase and lactic acid dehydrogenase [LDH]: Secondary | ICD-10-CM

## 2018-03-02 DIAGNOSIS — I429 Cardiomyopathy, unspecified: Secondary | ICD-10-CM | POA: Diagnosis not present

## 2018-03-02 DIAGNOSIS — F419 Anxiety disorder, unspecified: Secondary | ICD-10-CM | POA: Diagnosis not present

## 2018-03-02 DIAGNOSIS — I10 Essential (primary) hypertension: Secondary | ICD-10-CM | POA: Insufficient documentation

## 2018-03-02 DIAGNOSIS — R112 Nausea with vomiting, unspecified: Secondary | ICD-10-CM | POA: Insufficient documentation

## 2018-03-02 DIAGNOSIS — M7662 Achilles tendinitis, left leg: Secondary | ICD-10-CM

## 2018-03-02 DIAGNOSIS — M7661 Achilles tendinitis, right leg: Secondary | ICD-10-CM

## 2018-03-02 DIAGNOSIS — K219 Gastro-esophageal reflux disease without esophagitis: Secondary | ICD-10-CM | POA: Insufficient documentation

## 2018-03-02 DIAGNOSIS — Z86711 Personal history of pulmonary embolism: Secondary | ICD-10-CM | POA: Insufficient documentation

## 2018-03-02 DIAGNOSIS — D649 Anemia, unspecified: Secondary | ICD-10-CM | POA: Diagnosis not present

## 2018-03-02 DIAGNOSIS — Z79899 Other long term (current) drug therapy: Secondary | ICD-10-CM | POA: Insufficient documentation

## 2018-03-02 DIAGNOSIS — R9431 Abnormal electrocardiogram [ECG] [EKG]: Secondary | ICD-10-CM | POA: Insufficient documentation

## 2018-03-02 DIAGNOSIS — M79673 Pain in unspecified foot: Secondary | ICD-10-CM

## 2018-03-02 DIAGNOSIS — E872 Acidosis: Secondary | ICD-10-CM | POA: Diagnosis not present

## 2018-03-02 DIAGNOSIS — I160 Hypertensive urgency: Secondary | ICD-10-CM | POA: Insufficient documentation

## 2018-03-02 DIAGNOSIS — Z86718 Personal history of other venous thrombosis and embolism: Secondary | ICD-10-CM | POA: Diagnosis not present

## 2018-03-02 DIAGNOSIS — G8929 Other chronic pain: Secondary | ICD-10-CM | POA: Diagnosis not present

## 2018-03-02 DIAGNOSIS — I700 Atherosclerosis of aorta: Secondary | ICD-10-CM | POA: Diagnosis not present

## 2018-03-02 DIAGNOSIS — K760 Fatty (change of) liver, not elsewhere classified: Secondary | ICD-10-CM | POA: Insufficient documentation

## 2018-03-02 DIAGNOSIS — R7989 Other specified abnormal findings of blood chemistry: Secondary | ICD-10-CM | POA: Diagnosis present

## 2018-03-02 DIAGNOSIS — R7401 Elevation of levels of liver transaminase levels: Secondary | ICD-10-CM

## 2018-03-02 DIAGNOSIS — M545 Low back pain: Secondary | ICD-10-CM | POA: Diagnosis not present

## 2018-03-02 DIAGNOSIS — I161 Hypertensive emergency: Secondary | ICD-10-CM | POA: Diagnosis present

## 2018-03-02 DIAGNOSIS — K7010 Alcoholic hepatitis without ascites: Secondary | ICD-10-CM | POA: Diagnosis present

## 2018-03-02 LAB — CBC WITH DIFFERENTIAL/PLATELET
Abs Immature Granulocytes: 0.01 10*3/uL (ref 0.00–0.07)
Basophils Absolute: 0.1 10*3/uL (ref 0.0–0.1)
Basophils Relative: 1 %
Eosinophils Absolute: 0.1 10*3/uL (ref 0.0–0.5)
Eosinophils Relative: 1 %
HCT: 37.2 % (ref 36.0–46.0)
Hemoglobin: 11.7 g/dL — ABNORMAL LOW (ref 12.0–15.0)
Immature Granulocytes: 0 %
Lymphocytes Relative: 39 %
Lymphs Abs: 2.3 10*3/uL (ref 0.7–4.0)
MCH: 26.6 pg (ref 26.0–34.0)
MCHC: 31.5 g/dL (ref 30.0–36.0)
MCV: 84.5 fL (ref 80.0–100.0)
Monocytes Absolute: 0.5 10*3/uL (ref 0.1–1.0)
Monocytes Relative: 8 %
Neutro Abs: 2.9 10*3/uL (ref 1.7–7.7)
Neutrophils Relative %: 51 %
Platelets: 315 10*3/uL (ref 150–400)
RBC: 4.4 MIL/uL (ref 3.87–5.11)
RDW: 14.6 % (ref 11.5–15.5)
WBC: 5.9 10*3/uL (ref 4.0–10.5)
nRBC: 0 % (ref 0.0–0.2)

## 2018-03-02 LAB — COMPREHENSIVE METABOLIC PANEL
ALT: 86 U/L — ABNORMAL HIGH (ref 0–44)
AST: 130 U/L — ABNORMAL HIGH (ref 15–41)
Albumin: 4.6 g/dL (ref 3.5–5.0)
Alkaline Phosphatase: 55 U/L (ref 38–126)
Anion gap: 16 — ABNORMAL HIGH (ref 5–15)
BUN: 5 mg/dL — ABNORMAL LOW (ref 6–20)
CO2: 18 mmol/L — ABNORMAL LOW (ref 22–32)
Calcium: 9.1 mg/dL (ref 8.9–10.3)
Chloride: 101 mmol/L (ref 98–111)
Creatinine, Ser: 0.69 mg/dL (ref 0.44–1.00)
GFR calc Af Amer: >60 mL/min (ref >60)
GFR calc non Af Amer: >60 mL/min (ref >60)
Glucose, Bld: 93 mg/dL (ref 70–99)
Potassium: 3.1 mmol/L — ABNORMAL LOW (ref 3.5–5.1)
Sodium: 135 mmol/L (ref 135–145)
Total Bilirubin: 1 mg/dL (ref 0.3–1.2)
Total Protein: 7.6 g/dL (ref 6.5–8.1)

## 2018-03-02 LAB — URINALYSIS, ROUTINE W REFLEX MICROSCOPIC
Bilirubin Urine: NEGATIVE
Glucose, UA: NEGATIVE mg/dL
Ketones, ur: NEGATIVE mg/dL
Leukocytes,Ua: NEGATIVE
Nitrite: NEGATIVE
Protein, ur: NEGATIVE mg/dL
Specific Gravity, Urine: 1.002 — ABNORMAL LOW (ref 1.005–1.030)
pH: 7 (ref 5.0–8.0)

## 2018-03-02 LAB — LIPASE, BLOOD: Lipase: 50 U/L (ref 11–51)

## 2018-03-02 LAB — TROPONIN I: Troponin I: <0.03 ng/mL (ref <0.03)

## 2018-03-02 LAB — I-STAT BETA HCG BLOOD, ED (MC, WL, AP ONLY): I-stat hCG, quantitative: <5 m[IU]/mL (ref <5)

## 2018-03-02 MED ORDER — IOPAMIDOL (ISOVUE-300) INJECTION 61%
INTRAVENOUS | Status: AC
  Filled 2018-03-02: qty 100

## 2018-03-02 MED ORDER — HYDROMORPHONE HCL 1 MG/ML IJ SOLN
1.0000 mg | Freq: Once | INTRAMUSCULAR | Status: AC
  Administered 2018-03-02: 1 mg via INTRAVENOUS
  Filled 2018-03-02: qty 1

## 2018-03-02 MED ORDER — SODIUM CHLORIDE 0.9 % IV BOLUS
500.0000 mL | Freq: Once | INTRAVENOUS | Status: AC
  Administered 2018-03-02: 500 mL via INTRAVENOUS

## 2018-03-02 MED ORDER — LABETALOL HCL 5 MG/ML IV SOLN
10.0000 mg | Freq: Once | INTRAVENOUS | Status: AC
  Administered 2018-03-02: 10 mg via INTRAVENOUS
  Filled 2018-03-02: qty 4

## 2018-03-02 MED ORDER — PROMETHAZINE HCL 25 MG/ML IJ SOLN
25.0000 mg | Freq: Once | INTRAMUSCULAR | Status: AC
  Administered 2018-03-02: 25 mg via INTRAVENOUS
  Filled 2018-03-02: qty 1

## 2018-03-02 MED ORDER — SODIUM CHLORIDE (PF) 0.9 % IJ SOLN
INTRAMUSCULAR | Status: AC
  Filled 2018-03-02: qty 50

## 2018-03-02 MED ORDER — IOPAMIDOL (ISOVUE-300) INJECTION 61%
100.0000 mL | Freq: Once | INTRAVENOUS | Status: AC | PRN
  Administered 2018-03-02: 100 mL via INTRAVENOUS

## 2018-03-02 MED ORDER — ONDANSETRON HCL 4 MG/2ML IJ SOLN
4.0000 mg | Freq: Once | INTRAMUSCULAR | Status: AC
  Administered 2018-03-02: 4 mg via INTRAVENOUS
  Filled 2018-03-02: qty 2

## 2018-03-02 NOTE — ED Notes (Signed)
Patient transported to X-ray 

## 2018-03-02 NOTE — ED Provider Notes (Signed)
[facility]-EMERGENCY DEPT Provider Note   CSN: [id number] Arrival date & time: 03/02/18  1750    History   Chief Complaint Chief Complaint  Patient presents with  . Flank Pain    HPI Nicole Hughes is a 48 y.o. female with past medical history of pulmonary embolism, Pyelonephritis, endometriosis, pancreatitis, presenting to the emergency department complaint of gradual onset of right flank pain that began on Sunday.  Patient states pain was initially intermittent, however is now constant, radiating to her right upper abdomen.  She has associated nausea with nonbloody nonbilious emesis.  States her urine output is somewhat decreased.  She has somewhat loose stools as well.  No fevers, dysuria, malodorous urine, constipation, shortness of breath, chest pain.  Patient states the symptoms feel very different than her history of PE as well as pancreatitis.  She notes that she had pyelonephritis at age 38 and does not quite recall how it felt. Per chart review of CT abdomen pelvis done in January 2020, no evidence of renal calculi or cholelithiasis. LMP 02/27/2018.  She reports she has been compliant with her blood pressure medications today.    The history is provided by the patient and medical records.    Past Medical History:  Diagnosis Date  . Anxiety   . Cardiomyopathy (HCC)   . Chronic lower back pain   . Complication of anesthesia    "hard to get under"  . Cyst of ovary   . Depression    hx  . DVT complicating pregnancy 08/1998   RLE; RUE  . Dysmenorrhea   . Dyspareunia   . 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 GERD (gastroesophageal reflux disease)   . Hay fever    "fall and spring" (02/17/2017)  . Headache    "2-3 times/.wik" (02/17/2017)  . History of chicken pox   . Hypertension   . Migraine    "monthly" (02/17/2017)  . Pelvic pain   . Pulmonary embolism (HCC) 08/1998   S/P childbirth    Patient Active Problem List   Diagnosis  Date Noted  . Nausea & vomiting 03/03/2018  . Hypertensive emergency 03/03/2018  . Achilles tendonitis 01/28/2018  . Abnormal cervical Papanicolaou smear 09/29/2017  . Insomnia 09/29/2017  . Pain 09/29/2017  . Palpitations 05/25/2017  . Chest wall pain 05/25/2017  . Elevated transaminase level 05/25/2017  . Depression with anxiety 02/23/2017  . 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 Hyponatremia 02/17/2017  . Hypokalemia 09/24/2016  . H/O alcohol abuse 09/22/2016  . H/O acute pancreatitis 09/22/2016  . GERD (gastroesophageal reflux disease) 04/09/2015  . 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 Chest pain 08/20/2012  . Stress reaction 07/23/2012  . Essential hypertension 06/09/2012  . History of pulmonary embolism 06/09/2012  . Preop cardiovascular exam 06/08/2012  . Dysmenorrhea   . Dyspareunia   . Pelvic pain     Past Surgical History:  Procedure Laterality Date  . CESAREAN SECTION  1994  . ECTOPIC PREGNANCY SURGERY  2010  . LAPAROSCOPY N/A 09/24/2012   Procedure: OPERATIVE LAPROSCOPY WITH LYSIS OF ADHESIONS;  Surgeon: Sherron Monday, MD;  Location: [facility];  Service: Gynecology;  Laterality: N/A;  . TONSILLECTOMY  1984  . TUBAL LIGATION  2010     OB History   No obstetric history on file.      Home Medications    Prior to Admission medications   Medication Sig Start Date End Date Taking? Authorizing Provider  amLODipine (NORVASC) 5 MG tablet TAKE  1 TABLET BY MOUTH ONCE DAILY Patient taking differently: Take 5 mg by mouth daily.  08/13/17  Yes Tower, Audrie GallusMarne A, MD  ibuprofen (ADVIL,MOTRIN) 200 MG tablet Take 400 mg by mouth every 6 (six) hours as needed for headache or mild pain.   Yes [provider]  lisinopril (PRINIVIL,ZESTRIL) 10 MG tablet TAKE 1 TABLET BY MOUTH ONCE DAILY Patient taking differently: Take 10 mg by mouth daily.  08/13/17  Yes Tower, Audrie GallusMarne A, MD  metoprolol succinate (TOPROL-XL) 50 MG 24 hr tablet Take 1 tablet (50 mg total) by mouth daily. Take  with or immediately following a meal. 03/01/18  Yes Tower, Audrie GallusMarne A, MD  Multiple Vitamin (MULTIVITAMIN WITH MINERALS) TABS tablet Take 1 tablet by mouth daily. 02/19/17  Yes Berton Mountgbata, Sylvester I, MD  omeprazole (PRILOSEC) 20 MG capsule Take 1 capsule (20 mg total) by mouth daily. 01/28/18  Yes Tower, Audrie GallusMarne A, MD  HYDROcodone-acetaminophen (NORCO) 5-325 MG tablet Take 1 tablet by mouth every 6 (six) hours as needed for moderate pain. Patient not taking: Reported on 03/02/2018 02/16/18   Asencion IslamStover, Titorya, DPM  hydrOXYzine (ATARAX/VISTARIL) 25 MG tablet Take 1 tablet (25 mg total) by mouth every 8 (eight) hours as needed for anxiety or nausea. Patient not taking: Reported on 03/02/2018 01/28/18   Tower, Audrie GallusMarne A, MD  sertraline (ZOLOFT) 50 MG tablet Take 1 tablet (50 mg total) by mouth daily. 03/01/18   Tower, Audrie GallusMarne A, MD    Family History Family History  Problem Relation Age of Onset  . Cancer - Lung Mother   . Heart failure Father   . Hypertension Father     Social History Social History   Tobacco Use  . Smoking status: Never Smoker  . Smokeless tobacco: Never Used  Substance Use Topics  . Alcohol use: Not Currently    Alcohol/week: 5.0 standard drinks    Types: 5 Cans of beer per week    Frequency: Never    Comment: has quit (March 2019)  . Drug use: No     Allergies   Patient has no known allergies.   Review of Systems Review of Systems  Constitutional: Negative for fever.  Respiratory: Negative for shortness of breath.   Gastrointestinal: Positive for abdominal pain, nausea and vomiting. Negative for constipation and diarrhea.  Genitourinary: Positive for decreased urine volume and flank pain. Negative for dysuria.  All other systems reviewed and are negative.    Physical Exam Updated Vital Signs BP (!) 163/98   Pulse 78   Temp 97.8 F (36.6 C)   Resp 12   Ht 5\' 5"  (1.651 m)   Wt 74.4 kg   LMP 02/27/2018   SpO2 97%   BMI 27.29 kg/m   Physical Exam Vitals signs  and nursing note reviewed.  Constitutional:      Appearance: She is well-developed. She is not ill-appearing.     Comments: Patient appears very uncomfortable.  HENT:     Head: Normocephalic and atraumatic.     Mouth/Throat:     Mouth: Mucous membranes are moist.  Eyes:     Conjunctiva/sclera: Conjunctivae normal.  Cardiovascular:     Rate and Rhythm: Normal rate and regular rhythm.     Heart sounds: Normal heart sounds.  Pulmonary:     Effort: Pulmonary effort is normal. No respiratory distress.     Breath sounds: Normal breath sounds.  Abdominal:     General: Bowel sounds are normal. There is no distension.     Palpations:  Abdomen is soft.     Tenderness: There is abdominal tenderness (Right upper quadrant). There is right CVA tenderness. There is no guarding or rebound.  Skin:    General: Skin is warm.  Neurological:     Mental Status: She is alert.  Psychiatric:        Behavior: Behavior normal.      ED Treatments / Results  Labs (all labs ordered are listed, but only abnormal results are displayed) Labs Reviewed  COMPREHENSIVE METABOLIC PANEL - Abnormal; Notable for the following components:      Result Value   Potassium 3.1 (*)    CO2 18 (*)    BUN 5 (*)    AST 130 (*)    ALT 86 (*)    Anion gap 16 (*)    All other components within normal limits  CBC WITH DIFFERENTIAL/PLATELET - Abnormal; Notable for the following components:   Hemoglobin 11.7 (*)    All other components within normal limits  URINALYSIS, ROUTINE W REFLEX MICROSCOPIC - Abnormal; Notable for the following components:   Color, Urine COLORLESS (*)    Specific Gravity, Urine 1.002 (*)    Hgb urine dipstick SMALL (*)    Bacteria, UA RARE (*)    All other components within normal limits  URINE CULTURE  LIPASE, BLOOD  TROPONIN I  I-STAT BETA HCG BLOOD, ED ([facility], [facility], [facility] ONLY)    EKG EKG Interpretation  Date/Time:  Tuesday March 02 2018 21:30:32 EST Ventricular Rate:  110 PR Interval:      QRS Duration: 82 QT Interval:  361 QTC Calculation: 489 R Axis:   8 Text Interpretation:  Sinus tachycardia Low voltage, precordial leads Borderline prolonged QT interval No significant change since last tracing Confirmed by Shaune Pollack [phone]) on 03/02/2018 9:49:51 PM   Radiology Dg Chest 2 View  Result Date: 03/02/2018 CLINICAL DATA:  Right upper quadrant pain EXAM: CHEST - 2 VIEW COMPARISON:  01/26/2018 FINDINGS: Heart and mediastinal contours are within normal limits. No focal opacities or effusions. No acute bony abnormality. IMPRESSION: No active cardiopulmonary disease. Electronically Signed   By: Charlett Nose M.D.   On: 03/02/2018 22:22   Ct Abdomen Pelvis W Contrast  Result Date: 03/02/2018 CLINICAL DATA:  Bilateral flank pain EXAM: CT ABDOMEN AND PELVIS WITH CONTRAST TECHNIQUE: Multidetector CT imaging of the abdomen and pelvis was performed using the standard protocol following bolus administration of intravenous contrast. CONTRAST:  ISOVUE-300 IOPAMIDOL (ISOVUE-300) INJECTION 61% COMPARISON:  01/26/2018 FINDINGS: Lower chest: Lung bases are clear. No effusions. Heart is normal size. Hepatobiliary: Severe diffuse fatty infiltration of the liver. No visible focal abnormality. Gallbladder unremarkable. Pancreas: No focal abnormality or ductal dilatation. Spleen: No focal abnormality.  Normal size. Adrenals/Urinary Tract: No adrenal abnormality. No focal renal abnormality. No stones or hydronephrosis. Urinary bladder is unremarkable. Stomach/Bowel: Appendix is normal. Stomach, large and small bowel grossly unremarkable. Vascular/Lymphatic: Aortic atherosclerosis. No enlarged abdominal or pelvic lymph nodes. Reproductive: Uterus and adnexa unremarkable. No mass. Tampon within the vagina. Other: No free fluid or free air. Musculoskeletal: No acute bony abnormality. IMPRESSION: Severe diffuse fatty infiltration of the liver. Aortic atherosclerosis. No acute findings in the abdomen or  pelvis. Electronically Signed   By: Charlett Nose M.D.   On: 03/02/2018 20:52   US Abdomen Limited Ruq  Result Date: 03/02/2018 CLINICAL DATA:  Abdominal pain EXAM: ULTRASOUND ABDOMEN LIMITED RIGHT UPPER QUADRANT COMPARISON:  CT performed today FINDINGS: Gallbladder: No gallstones or wall thickening visualized. No sonographic  Murphy sign noted by sonographer. Common bile duct: Diameter: Borderline in diameter, 8 mm. Liver: Increased echotexture compatible with fatty infiltration. No focal abnormality or biliary ductal dilatation. Portal vein is patent on color Doppler imaging with normal direction of blood flow towards the liver. IMPRESSION: Fatty infiltration of the liver. Borderline diameter of the common bile duct, 8 mm. No evidence of cholelithiasis or cholecystitis. Electronically Signed   By: Charlett Nose M.D.   On: 03/02/2018 23:09    Procedures Procedures (including critical care time)  Medications Ordered in ED Medications  iopamidol (ISOVUE-300) 61 % injection (has no administration in time range)  sodium chloride (PF) 0.9 % injection (has no administration in time range)  HYDROmorphone (DILAUDID) injection 1 mg (1 mg Intravenous Given 03/02/18 1900)  ondansetron (ZOFRAN) injection 4 mg (4 mg Intravenous Given 03/02/18 1859)  sodium chloride 0.9 % bolus 500 mL (0 mLs Intravenous Stopped 03/02/18 2145)  HYDROmorphone (DILAUDID) injection 1 mg (1 mg Intravenous Given 03/02/18 2026)  iopamidol (ISOVUE-300) 61 % injection 100 mL (100 mLs Intravenous Contrast Given 03/02/18 2039)  labetalol (NORMODYNE,TRANDATE) injection 10 mg (10 mg Intravenous Given 03/02/18 2228)  HYDROmorphone (DILAUDID) injection 1 mg (1 mg Intravenous Given 03/02/18 2225)  promethazine (PHENERGAN) injection 25 mg (25 mg Intravenous Given 03/02/18 2229)     Initial Impression / Assessment and Plan / ED Course  I have reviewed the triage vital signs and the nursing notes.  Pertinent labs & imaging results that were  available during my care of the patient were reviewed by me and considered in my medical decision making (see chart for details).  Clinical Course as of Mar 03 8  Tue Mar 02, 2018  2329 Discussed with Dr. Randa Evens with GI. Agrees with admission. NPO at midnight. May scope tomorrow.    [JR]    Clinical Course User Index [JR] Robinson, [place] N, PA-C       Patient presenting with worsening right upper quadrant/flank pain since Sunday.  Associated nausea and vomiting.  Patient is tachycardic and hypertensive on arrival, reports compliance with BP medications. Denies recent alcohol use.  Abdomen is soft with right upper quadrant and right flank tenderness.  No guarding or rebound.  Appears very uncomfortable on exam.  Pain medication and antiemetics administered.  Labs ordered.  CT abd.  Differential diagnosis includes pyelonephritis, cholelithiasis, cholecystitis, pancreatitis.  Consider PE, however patient states this feels very different from her prior PE.  No shortness of breath or chest pain. Consider dissection, however pt had CT chest last month and aorta appears normal.  Labs without leukocytosis.  CMP with transaminitis.  Lipase is within normal limits.  hCG negative.  UA negative.  Patient without much improvement after Dilaudid.  Redosed.  Discussed with Dr. Erma Heritage.  CXR ordered. Will proceed with right upper quadrant ultrasound.  CXR neg. RUQ with borderline dilated common bile duct. Pt requiring multiple doses of IV pain medication with minimal improvement. GI consulted, agreeable to admission. Recommends NPO at midnight with possible endoscopy for further evaluation. Dr. Toniann Fail accepting admission.  Patient discussed with and seen by Dr. Erma Heritage.  The patient appears reasonably stabilized for admission considering the current resources, flow, and capabilities available in the ED at this time, and I doubt any other [facility] requiring further screening and/or treatment in the ED prior to  admission.   Final Clinical Impressions(s) / ED Diagnoses   Final diagnoses:  RUQ abdominal pain  Hypertension, unspecified type  Transaminitis  Non-intractable vomiting with nausea, unspecified  vomiting type    ED Discharge Orders    None       Robinson, [place] N, PA-C 03/03/18 Orpah Cobb    Shaune Pollack, MD 03/03/18 [phone]

## 2018-03-02 NOTE — ED Notes (Signed)
RN made aware of pts elevated BP 

## 2018-03-02 NOTE — ED Triage Notes (Signed)
Patient c/o right back and flank pain radiating to right abdomen with nausea x3 days. Hx kidney stones. Reports decreased urine output.

## 2018-03-03 ENCOUNTER — Encounter (HOSPITAL_COMMUNITY): Payer: Self-pay | Admitting: Internal Medicine

## 2018-03-03 ENCOUNTER — Other Ambulatory Visit: Payer: Self-pay

## 2018-03-03 DIAGNOSIS — R112 Nausea with vomiting, unspecified: Secondary | ICD-10-CM

## 2018-03-03 DIAGNOSIS — R945 Abnormal results of liver function studies: Secondary | ICD-10-CM | POA: Diagnosis not present

## 2018-03-03 DIAGNOSIS — I161 Hypertensive emergency: Secondary | ICD-10-CM | POA: Diagnosis present

## 2018-03-03 DIAGNOSIS — R7989 Other specified abnormal findings of blood chemistry: Secondary | ICD-10-CM | POA: Diagnosis present

## 2018-03-03 DIAGNOSIS — I10 Essential (primary) hypertension: Secondary | ICD-10-CM | POA: Diagnosis not present

## 2018-03-03 LAB — BASIC METABOLIC PANEL
Anion gap: 8 (ref 5–15)
BUN: 6 mg/dL (ref 6–20)
CO2: 24 mmol/L (ref 22–32)
Calcium: 8.7 mg/dL — ABNORMAL LOW (ref 8.9–10.3)
Chloride: 107 mmol/L (ref 98–111)
Creatinine, Ser: 0.69 mg/dL (ref 0.44–1.00)
GFR calc Af Amer: >60 mL/min (ref >60)
GFR calc non Af Amer: >60 mL/min (ref >60)
Glucose, Bld: 96 mg/dL (ref 70–99)
Potassium: 3.4 mmol/L — ABNORMAL LOW (ref 3.5–5.1)
Sodium: 139 mmol/L (ref 135–145)

## 2018-03-03 LAB — CBC WITH DIFFERENTIAL/PLATELET
Abs Immature Granulocytes: 0.01 10*3/uL (ref 0.00–0.07)
Basophils Absolute: 0.1 10*3/uL (ref 0.0–0.1)
Basophils Relative: 1 %
Eosinophils Absolute: 0.2 10*3/uL (ref 0.0–0.5)
Eosinophils Relative: 3 %
HCT: 36.2 % (ref 36.0–46.0)
Hemoglobin: 10.8 g/dL — ABNORMAL LOW (ref 12.0–15.0)
Immature Granulocytes: 0 %
Lymphocytes Relative: 43 %
Lymphs Abs: 2.1 10*3/uL (ref 0.7–4.0)
MCH: 26.3 pg (ref 26.0–34.0)
MCHC: 29.8 g/dL — ABNORMAL LOW (ref 30.0–36.0)
MCV: 88.1 fL (ref 80.0–100.0)
Monocytes Absolute: 0.5 10*3/uL (ref 0.1–1.0)
Monocytes Relative: 11 %
Neutro Abs: 2 10*3/uL (ref 1.7–7.7)
Neutrophils Relative %: 42 %
Platelets: 231 10*3/uL (ref 150–400)
RBC: 4.11 MIL/uL (ref 3.87–5.11)
RDW: 14.7 % (ref 11.5–15.5)
WBC: 4.9 10*3/uL (ref 4.0–10.5)
nRBC: 0 % (ref 0.0–0.2)

## 2018-03-03 LAB — HEPATIC FUNCTION PANEL
ALT: 77 U/L — ABNORMAL HIGH (ref 0–44)
AST: 136 U/L — ABNORMAL HIGH (ref 15–41)
Albumin: 4.3 g/dL (ref 3.5–5.0)
Alkaline Phosphatase: 48 U/L (ref 38–126)
Bilirubin, Direct: 0.2 mg/dL (ref 0.0–0.2)
Indirect Bilirubin: 1 mg/dL — ABNORMAL HIGH (ref 0.3–0.9)
Total Bilirubin: 1.2 mg/dL (ref 0.3–1.2)
Total Protein: 7 g/dL (ref 6.5–8.1)

## 2018-03-03 LAB — PROTIME-INR
INR: 1 (ref 0.8–1.2)
Prothrombin Time: 12.7 seconds (ref 11.4–15.2)

## 2018-03-03 LAB — HIV ANTIBODY (ROUTINE TESTING W REFLEX): HIV Screen 4th Generation wRfx: NONREACTIVE

## 2018-03-03 MED ORDER — METOPROLOL SUCCINATE ER 50 MG PO TB24
50.0000 mg | ORAL_TABLET | Freq: Every day | ORAL | Status: DC
  Administered 2018-03-03 – 2018-03-05 (×3): 50 mg via ORAL
  Filled 2018-03-03 (×3): qty 1

## 2018-03-03 MED ORDER — OXYCODONE HCL 5 MG PO TABS
5.0000 mg | ORAL_TABLET | ORAL | Status: DC | PRN
  Administered 2018-03-03 – 2018-03-05 (×9): 5 mg via ORAL
  Filled 2018-03-03 (×9): qty 1

## 2018-03-03 MED ORDER — LISINOPRIL 10 MG PO TABS
10.0000 mg | ORAL_TABLET | Freq: Every day | ORAL | Status: DC
  Administered 2018-03-03 – 2018-03-05 (×3): 10 mg via ORAL
  Filled 2018-03-03 (×3): qty 1

## 2018-03-03 MED ORDER — HYDROMORPHONE HCL 1 MG/ML IJ SOLN
1.0000 mg | INTRAMUSCULAR | Status: DC | PRN
  Administered 2018-03-03 (×2): 1 mg via INTRAVENOUS
  Filled 2018-03-03 (×2): qty 1

## 2018-03-03 MED ORDER — ACETAMINOPHEN 650 MG RE SUPP: 650.0000 mg | Freq: Four times a day (QID) | RECTAL | Status: DC | PRN

## 2018-03-03 MED ORDER — HYDROMORPHONE HCL 1 MG/ML IJ SOLN
0.5000 mg | INTRAMUSCULAR | Status: DC | PRN
  Administered 2018-03-03: 0.5 mg via INTRAVENOUS
  Filled 2018-03-03: qty 0.5

## 2018-03-03 MED ORDER — PANTOPRAZOLE SODIUM 40 MG IV SOLR
40.0000 mg | Freq: Every day | INTRAVENOUS | Status: DC
  Administered 2018-03-03 – 2018-03-04 (×3): 40 mg via INTRAVENOUS
  Filled 2018-03-03 (×3): qty 40

## 2018-03-03 MED ORDER — ONDANSETRON HCL 4 MG/2ML IJ SOLN
4.0000 mg | Freq: Four times a day (QID) | INTRAMUSCULAR | Status: DC | PRN
  Administered 2018-03-03 (×3): 4 mg via INTRAVENOUS
  Filled 2018-03-03 (×4): qty 2

## 2018-03-03 MED ORDER — ONDANSETRON HCL 4 MG PO TABS: 4.0000 mg | ORAL_TABLET | Freq: Four times a day (QID) | ORAL | Status: DC | PRN

## 2018-03-03 MED ORDER — AMLODIPINE BESYLATE 5 MG PO TABS
5.0000 mg | ORAL_TABLET | Freq: Every day | ORAL | Status: DC
  Administered 2018-03-03 – 2018-03-05 (×3): 5 mg via ORAL
  Filled 2018-03-03 (×3): qty 1

## 2018-03-03 MED ORDER — ZOLPIDEM TARTRATE 5 MG PO TABS
5.0000 mg | ORAL_TABLET | Freq: Every evening | ORAL | Status: DC | PRN
  Administered 2018-03-03 – 2018-03-04 (×2): 5 mg via ORAL
  Filled 2018-03-03 (×2): qty 1

## 2018-03-03 MED ORDER — HYDROMORPHONE HCL 1 MG/ML IJ SOLN
0.5000 mg | Freq: Once | INTRAMUSCULAR | Status: AC
  Administered 2018-03-03: 0.5 mg via INTRAVENOUS
  Filled 2018-03-03: qty 0.5

## 2018-03-03 MED ORDER — HYDROMORPHONE HCL 1 MG/ML IJ SOLN
1.0000 mg | Freq: Once | INTRAMUSCULAR | Status: AC
  Administered 2018-03-03: 1 mg via INTRAVENOUS
  Filled 2018-03-03: qty 1

## 2018-03-03 MED ORDER — ACETAMINOPHEN 325 MG PO TABS
650.0000 mg | ORAL_TABLET | Freq: Four times a day (QID) | ORAL | Status: DC | PRN
  Administered 2018-03-03 – 2018-03-05 (×5): 650 mg via ORAL
  Filled 2018-03-03 (×5): qty 2

## 2018-03-03 MED ORDER — SODIUM CHLORIDE 0.9 % IV SOLN
INTRAVENOUS | Status: AC
  Administered 2018-03-03 (×2): via INTRAVENOUS

## 2018-03-03 MED ORDER — HYDRALAZINE HCL 20 MG/ML IJ SOLN
5.0000 mg | INTRAMUSCULAR | Status: DC | PRN
  Administered 2018-03-03 – 2018-03-04 (×3): 5 mg via INTRAVENOUS
  Filled 2018-03-03 (×3): qty 1

## 2018-03-03 NOTE — H&P (Signed)
History and Physical    [name] GSU:[id number] DOB: [birth year] DOA: 03/02/2018  PCP: Judy Pimple, MD  Patient coming from: Home.  Chief Complaint: Back pain nausea vomiting.  HPI: Nicole Hughes is a 48 y.o. female with previous history of alcohol abuse has not had any alcohol since January 2 months ago presents to the ER because of increasing nausea vomiting and back pain radiating to the front for the last 3 days.  Denies any blood in the vomitus.  Denies any diarrhea.  Pain has become more constant over the last few hours.  Patient was able to take her antihypertensives.  Patient admits to taking marijuana.  ED Course: In the ER labs revealed elevated AST of 130 ALT of 86 and total bilirubin 1.  Lipase was normal.  CT abdomen pelvis was unremarkable.  Ultrasound abdomen was showing borderline dilated CBD.  ER physician discussed with on-call gastroenterologist Dr. Randa Evens will see patient in consult.  Patient admitted for abdominal pain and nausea vomiting.  Blood pressure on arrival was elevated which improved with pain relief.  Review of Systems: As per HPI, rest all negative.   Past Medical History:  Diagnosis Date  . Anxiety   . Cardiomyopathy (HCC)   . Chronic lower back pain   . Complication of anesthesia    "hard to get under"  . Cyst of ovary   . Depression    hx  . DVT complicating pregnancy 08/1998   RLE; RUE  . Dysmenorrhea   . Dyspareunia   . 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 GERD (gastroesophageal reflux disease)   . Hay fever    "fall and spring" (02/17/2017)  . Headache    "2-3 times/.wik" (02/17/2017)  . History of chicken pox   . Hypertension   . Migraine    "monthly" (02/17/2017)  . Pelvic pain   . Pulmonary embolism (HCC) 08/1998   S/P childbirth    Past Surgical History:  Procedure Laterality Date  . CESAREAN SECTION  1994  . ECTOPIC PREGNANCY SURGERY  2010  . LAPAROSCOPY N/A 09/24/2012   Procedure: OPERATIVE  LAPROSCOPY WITH LYSIS OF ADHESIONS;  Surgeon: Sherron Monday, MD;  Location: [facility];  Service: Gynecology;  Laterality: N/A;  . TONSILLECTOMY  1984  . TUBAL LIGATION  2010     reports that she has never smoked. She has never used smokeless tobacco. She reports previous alcohol use of about 5.0 standard drinks of alcohol per week. She reports that she does not use drugs.  No Known Allergies  Family History  Problem Relation Age of Onset  . Cancer - Lung Mother   . Heart failure Father   . Hypertension Father     Prior to Admission medications   Medication Sig Start Date End Date Taking? Authorizing Provider  amLODipine (NORVASC) 5 MG tablet TAKE 1 TABLET BY MOUTH ONCE DAILY Patient taking differently: Take 5 mg by mouth daily.  08/13/17  Yes Tower, Audrie Gallus, MD  ibuprofen (ADVIL,MOTRIN) 200 MG tablet Take 400 mg by mouth every 6 (six) hours as needed for headache or mild pain.   Yes [provider]  lisinopril (PRINIVIL,ZESTRIL) 10 MG tablet TAKE 1 TABLET BY MOUTH ONCE DAILY Patient taking differently: Take 10 mg by mouth daily.  08/13/17  Yes Tower, Audrie Gallus, MD  metoprolol succinate (TOPROL-XL) 50 MG 24 hr tablet Take 1 tablet (50 mg total) by mouth daily. Take with or immediately following a meal. 03/01/18  Yes  Tower, Audrie Gallus, MD  Multiple Vitamin (MULTIVITAMIN WITH MINERALS) TABS tablet Take 1 tablet by mouth daily. 02/19/17  Yes Berton Mount I, MD  omeprazole (PRILOSEC) 20 MG capsule Take 1 capsule (20 mg total) by mouth daily. 01/28/18  Yes Tower, Audrie Gallus, MD  HYDROcodone-acetaminophen (NORCO) 5-325 MG tablet Take 1 tablet by mouth every 6 (six) hours as needed for moderate pain. Patient not taking: Reported on 03/02/2018 02/16/18   Asencion Islam, DPM  hydrOXYzine (ATARAX/VISTARIL) 25 MG tablet Take 1 tablet (25 mg total) by mouth every 8 (eight) hours as needed for anxiety or nausea. Patient not taking: Reported on 03/02/2018 01/28/18   Tower, Audrie Gallus, MD  sertraline (ZOLOFT) 50  MG tablet Take 1 tablet (50 mg total) by mouth daily. 03/01/18   Judy Pimple, MD    Physical Exam: Vitals:   03/02/18 2030 03/02/18 2230 03/02/18 2300 03/02/18 2330  BP: (!) 193/92 (!) 160/101 (!) 178/109 (!) 163/98  Pulse: (!) 115 99 89 78  Resp: 19 16 (!) 29 12  Temp:      TempSrc:      SpO2: 97% 97% 98% 97%  Weight:      Height:          Constitutional: Moderately built and nourished. Vitals:   03/02/18 2030 03/02/18 2230 03/02/18 2300 03/02/18 2330  BP: (!) 193/92 (!) 160/101 (!) 178/109 (!) 163/98  Pulse: (!) 115 99 89 78  Resp: 19 16 (!) 29 12  Temp:      TempSrc:      SpO2: 97% 97% 98% 97%  Weight:      Height:       Eyes: Anicteric no pallor. ENMT: No discharge from the ears eyes nose or mouth. Neck: No mass felt.  No neck rigidity. Respiratory: No rhonchi or crepitations. Cardiovascular: S1-S2 heard. Abdomen: Soft nontender bowel sounds present. Musculoskeletal: No edema. Skin: No rash. Neurologic: Alert awake oriented to time place and person.  Moves all extremities. Psychiatric: Appears normal.  Normal affect.   Labs on Admission: I have personally reviewed following labs and imaging studies  CBC: Recent Labs  Lab 03/02/18 1904  WBC 5.9  NEUTROABS 2.9  HGB 11.7*  HCT 37.2  MCV 84.5  PLT 315   Basic Metabolic Panel: Recent Labs  Lab 03/02/18 1904  NA 135  K 3.1*  CL 101  CO2 18*  GLUCOSE 93  BUN 5*  CREATININE 0.69  CALCIUM 9.1   GFR: Estimated Creatinine Clearance: 89.7 mL/min (by C-G formula based on SCr of 0.69 mg/dL). Liver Function Tests: Recent Labs  Lab 03/02/18 1904  AST 130*  ALT 86*  ALKPHOS 55  BILITOT 1.0  PROT 7.6  ALBUMIN 4.6   Recent Labs  Lab 03/02/18 1904  LIPASE 50   No results for input(s): AMMONIA in the last 168 hours. Coagulation Profile: No results for input(s): INR, PROTIME in the last 168 hours. Cardiac Enzymes: Recent Labs  Lab 03/02/18 2208  TROPONINI <0.03   BNP (last 3  results) Recent Labs    06/22/17 1015  PROBNP 155   HbA1C: No results for input(s): HGBA1C in the last 72 hours. CBG: No results for input(s): GLUCAP in the last 168 hours. Lipid Profile: No results for input(s): CHOL, HDL, LDLCALC, TRIG, CHOLHDL, LDLDIRECT in the last 72 hours. Thyroid Function Tests: No results for input(s): TSH, T4TOTAL, FREET4, T3FREE, THYROIDAB in the last 72 hours. Anemia Panel: No results for input(s): VITAMINB12, FOLATE, FERRITIN, TIBC, IRON, RETICCTPCT  in the last 72 hours. Urine analysis:    Component Value Date/Time   COLORURINE COLORLESS (A) 03/02/2018 1904   APPEARANCEUR CLEAR 03/02/2018 1904   LABSPEC 1.002 (L) 03/02/2018 1904   PHURINE 7.0 03/02/2018 1904   GLUCOSEU NEGATIVE 03/02/2018 1904   HGBUR SMALL (A) 03/02/2018 1904   BILIRUBINUR NEGATIVE 03/02/2018 1904   BILIRUBINUR trace 12/20/2012 1553   KETONESUR NEGATIVE 03/02/2018 1904   PROTEINUR NEGATIVE 03/02/2018 1904   UROBILINOGEN 0.2 06/22/2014 1147   NITRITE NEGATIVE 03/02/2018 1904   LEUKOCYTESUR NEGATIVE 03/02/2018 1904   Sepsis Labs: @LABRCNTIP (procalcitonin:4,lacticidven:4) )No results found for this or any previous visit (from the past 240 hour(s)).   Radiological Exams on Admission: Dg Chest 2 View  Result Date: 03/02/2018 CLINICAL DATA:  Right upper quadrant pain EXAM: CHEST - 2 VIEW COMPARISON:  01/26/2018 FINDINGS: Heart and mediastinal contours are within normal limits. No focal opacities or effusions. No acute bony abnormality. IMPRESSION: No active cardiopulmonary disease. Electronically Signed   By: Charlett Nose M.D.   On: 03/02/2018 22:22   Ct Abdomen Pelvis W Contrast  Result Date: 03/02/2018 CLINICAL DATA:  Bilateral flank pain EXAM: CT ABDOMEN AND PELVIS WITH CONTRAST TECHNIQUE: Multidetector CT imaging of the abdomen and pelvis was performed using the standard protocol following bolus administration of intravenous contrast. CONTRAST:  ISOVUE-300 IOPAMIDOL  (ISOVUE-300) INJECTION 61% COMPARISON:  01/26/2018 FINDINGS: Lower chest: Lung bases are clear. No effusions. Heart is normal size. Hepatobiliary: Severe diffuse fatty infiltration of the liver. No visible focal abnormality. Gallbladder unremarkable. Pancreas: No focal abnormality or ductal dilatation. Spleen: No focal abnormality.  Normal size. Adrenals/Urinary Tract: No adrenal abnormality. No focal renal abnormality. No stones or hydronephrosis. Urinary bladder is unremarkable. Stomach/Bowel: Appendix is normal. Stomach, large and small bowel grossly unremarkable. Vascular/Lymphatic: Aortic atherosclerosis. No enlarged abdominal or pelvic lymph nodes. Reproductive: Uterus and adnexa unremarkable. No mass. Tampon within the vagina. Other: No free fluid or free air. Musculoskeletal: No acute bony abnormality. IMPRESSION: Severe diffuse fatty infiltration of the liver. Aortic atherosclerosis. No acute findings in the abdomen or pelvis. Electronically Signed   By: Charlett Nose M.D.   On: 03/02/2018 20:52   US Abdomen Limited Ruq  Result Date: 03/02/2018 CLINICAL DATA:  Abdominal pain EXAM: ULTRASOUND ABDOMEN LIMITED RIGHT UPPER QUADRANT COMPARISON:  CT performed today FINDINGS: Gallbladder: No gallstones or wall thickening visualized. No sonographic Murphy sign noted by sonographer. Common bile duct: Diameter: Borderline in diameter, 8 mm. Liver: Increased echotexture compatible with fatty infiltration. No focal abnormality or biliary ductal dilatation. Portal vein is patent on color Doppler imaging with normal direction of blood flow towards the liver. IMPRESSION: Fatty infiltration of the liver. Borderline diameter of the common bile duct, 8 mm. No evidence of cholelithiasis or cholecystitis. Electronically Signed   By: Charlett Nose M.D.   On: 03/02/2018 23:09    EKG: Independently reviewed.  Sinus tachycardia.  Assessment/Plan Principal Problem:   Nausea & vomiting Active Problems:   Hypertension    Hypertensive emergency   Elevated LFTs    1. Nausea vomiting with back pain radiating to friend with mildly elevated LFTs and sonogram of the abdomen showing borderline dilated CBD -we will keep patient n.p.o. and pain relief medications and IV fluids.  Check urine drug screen.  Dr. Randa Evens gastroenterology has been consulted for further recommendations. 2. Hypertensive urgency improved with pain relief.  Continue patient's home medications including ACE inhibitor beta-blocker and amlodipine.  PRN IV hydralazine for systolic more than 160. 3. History  of marijuana abuse. 4. Normocytic normochromic anemia -follow CBC. 5. Elevated LFTs -patient states she stopped drinking alcohol 2 months ago.  Follow LFTs.  CT abdomen was unremarkable.  Check acute hepatitis panel with next blood draw. 6. History of GERD on PPI. 7. History of alcohol abuse in remission at this time per patient. 8. Metabolic acidosis likely from vomiting.  I think will improve with IV fluids.  Urine drug screen is pending.   DVT prophylaxis: SCDs in anticipation of procedure. Code Status: Full code. Family Communication: Discussed with patient. Disposition Plan: Home. Consults called: Gastroenterology. Admission status: Observation.   Eduard ClosArshad N Montrae Braithwaite MD [facility] Pager [phone].  If 7PM-7AM, please contact night-coverage [email or web address] Password [email or web address]  03/03/2018, 12:14 AM

## 2018-03-03 NOTE — ED Notes (Signed)
ED TO INPATIENT HANDOFF REPORT  Name/Age/Gender Nicole Hughes 48 y.o. female  Code Status    Code Status Orders  (From admission, onward)         Start     Ordered   03/03/18 0013  Full code  Continuous     03/03/18 0014        Code Status History    Date Active Date Inactive Code Status Order ID Comments User Context   02/17/2017 0203 02/18/2017 2219 Full Code [id number]  OpydLavone Neri, MD ED      Home/SNF/Other Given to floor  Chief Complaint Nausea, Back pain, Urinary issues  Level of Care/Admitting Diagnosis ED Disposition    ED Disposition Condition Comment   Admit  Hospital Area: [facility] [[id number]]  Level of Care: Telemetry [5]  Admit to tele based on following criteria: Monitor for Ischemic changes  Diagnosis: Nausea & vomiting [277057]  Admitting Physician: Eduard Clos [phone]  Attending Physician: Eduard Clos Florian.Pax  PT Class (Do Not Modify): Observation [104]  PT Acc Code (Do Not Modify): Observation [10022]       Medical History Past Medical History:  Diagnosis Date  . Anxiety   . Cardiomyopathy (HCC)   . Chronic lower back pain   . Complication of anesthesia    "hard to get under"  . Cyst of ovary   . Depression    hx  . DVT complicating pregnancy 08/1998   RLE; RUE  . Dysmenorrhea   . Dyspareunia   . 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 GERD (gastroesophageal reflux disease)   . Hay fever    "fall and spring" (02/17/2017)  . Headache    "2-3 times/.wik" (02/17/2017)  . History of chicken pox   . Hypertension   . Migraine    "monthly" (02/17/2017)  . Pelvic pain   . Pulmonary embolism (HCC) 08/1998   S/P childbirth    Allergies No Known Allergies  IV Location/Drains/Wounds Patient Lines/Drains/Airways Status   Active Line/Drains/Airways    Name:   Placement date:   Placement time:   Site:   Days:   Peripheral IV 03/02/18 Left Antecubital   03/02/18    1903    Antecubital    1          Labs/Imaging Results for orders placed or performed during the hospital encounter of 03/02/18 (from the past 48 hour(s))  Comprehensive metabolic panel     Status: Abnormal   Collection Time: 03/02/18  7:04 PM  Result Value Ref Range   Sodium 135 135 - 145 mmol/L   Potassium 3.1 (L) 3.5 - 5.1 mmol/L   Chloride 101 98 - 111 mmol/L   CO2 18 (L) 22 - 32 mmol/L   Glucose, Bld 93 70 - 99 mg/dL   BUN 5 (L) 6 - 20 mg/dL   Creatinine, Ser 1.19 0.44 - 1.00 mg/dL   Calcium 9.1 8.9 - 14.7 mg/dL   Total Protein 7.6 6.5 - 8.1 g/dL   Albumin 4.6 3.5 - 5.0 g/dL   AST 829 (H) 15 - 41 U/L   ALT 86 (H) 0 - 44 U/L   Alkaline Phosphatase 55 38 - 126 U/L   Total Bilirubin 1.0 0.3 - 1.2 mg/dL   GFR calc non Af Amer >60 >60 mL/min   GFR calc Af Amer >60 >60 mL/min   Anion gap 16 (H) 5 - 15    Comment: Performed at [facility], [address] [place], [place] [address]  CBC with Differential     Status: Abnormal   Collection Time: 03/02/18  7:04 PM  Result Value Ref Range   WBC 5.9 4.0 - 10.5 K/uL   RBC 4.40 3.87 - 5.11 MIL/uL   Hemoglobin 11.7 (L) 12.0 - 15.0 g/dL   HCT 05.6 97.9 - 48.0 %   MCV 84.5 80.0 - 100.0 fL   MCH 26.6 26.0 - 34.0 pg   MCHC 31.5 30.0 - 36.0 g/dL   RDW 16.5 53.7 - 48.2 %   Platelets 315 150 - 400 K/uL   nRBC 0.0 0.0 - 0.2 %   Neutrophils Relative % 51 %   Neutro Abs 2.9 1.7 - 7.7 K/uL   Lymphocytes Relative 39 %   Lymphs Abs 2.3 0.7 - 4.0 K/uL   Monocytes Relative 8 %   Monocytes Absolute 0.5 0.1 - 1.0 K/uL   Eosinophils Relative 1 %   Eosinophils Absolute 0.1 0.0 - 0.5 K/uL   Basophils Relative 1 %   Basophils Absolute 0.1 0.0 - 0.1 K/uL   Immature Granulocytes 0 %   Abs Immature Granulocytes 0.01 0.00 - 0.07 K/uL    Comment: Performed at [facility], [address] [place], [place] [address]  Urinalysis, Routine w reflex microscopic     Status: Abnormal   Collection Time: 03/02/18  7:04 PM  Result Value  Ref Range   Color, Urine COLORLESS (A) YELLOW   APPearance CLEAR CLEAR   Specific Gravity, Urine 1.002 (L) 1.005 - 1.030   pH 7.0 5.0 - 8.0   Glucose, UA NEGATIVE NEGATIVE mg/dL   Hgb urine dipstick SMALL (A) NEGATIVE   Bilirubin Urine NEGATIVE NEGATIVE   Ketones, ur NEGATIVE NEGATIVE mg/dL   Protein, ur NEGATIVE NEGATIVE mg/dL   Nitrite NEGATIVE NEGATIVE   Leukocytes,Ua NEGATIVE NEGATIVE   RBC / HPF 0-5 0 - 5 RBC/hpf   WBC, UA 0-5 0 - 5 WBC/hpf   Bacteria, UA RARE (A) NONE SEEN   Squamous Epithelial / LPF 0-5 0 - 5    Comment: Performed at [facility], [address], [place], [place] [address]  Lipase, blood     Status: None   Collection Time: 03/02/18  7:04 PM  Result Value Ref Range   Lipase 50 11 - 51 U/L    Comment: Performed at [facility], [address] [place], [place] [address]  I-Stat beta hCG blood, ED     Status: None   Collection Time: 03/02/18  7:20 PM  Result Value Ref Range   I-stat hCG, quantitative <5.0 <5 mIU/mL   Comment 3            Comment:   GEST. AGE      CONC.  (mIU/mL)   <=1 WEEK        5 - 50     2 WEEKS       50 - 500     3 WEEKS       100 - 10,000     4 WEEKS     1,000 - 30,000        FEMALE AND NON-PREGNANT FEMALE:     LESS THAN 5 mIU/mL   Troponin I - Once     Status: None   Collection Time: 03/02/18 10:08 PM  Result Value Ref Range   Troponin I <0.03 <0.03 ng/mL    Comment: Performed at [facility], [address], [place], [place] [address]   Dg Chest 2 View  Result Date: 03/02/2018 CLINICAL DATA:  Right upper quadrant pain EXAM: CHEST - 2 VIEW COMPARISON:  01/26/2018 FINDINGS: Heart and mediastinal contours are within normal limits. No focal opacities or effusions. No acute bony abnormality. IMPRESSION: No active cardiopulmonary disease. Electronically Signed   By: Charlett Nose M.D.   On: 03/02/2018 22:22   Ct Abdomen Pelvis W Contrast  Result Date: 03/02/2018 CLINICAL DATA:   Bilateral flank pain EXAM: CT ABDOMEN AND PELVIS WITH CONTRAST TECHNIQUE: Multidetector CT imaging of the abdomen and pelvis was performed using the standard protocol following bolus administration of intravenous contrast. CONTRAST:  ISOVUE-300 IOPAMIDOL (ISOVUE-300) INJECTION 61% COMPARISON:  01/26/2018 FINDINGS: Lower chest: Lung bases are clear. No effusions. Heart is normal size. Hepatobiliary: Severe diffuse fatty infiltration of the liver. No visible focal abnormality. Gallbladder unremarkable. Pancreas: No focal abnormality or ductal dilatation. Spleen: No focal abnormality.  Normal size. Adrenals/Urinary Tract: No adrenal abnormality. No focal renal abnormality. No stones or hydronephrosis. Urinary bladder is unremarkable. Stomach/Bowel: Appendix is normal. Stomach, large and small bowel grossly unremarkable. Vascular/Lymphatic: Aortic atherosclerosis. No enlarged abdominal or pelvic lymph nodes. Reproductive: Uterus and adnexa unremarkable. No mass. Tampon within the vagina. Other: No free fluid or free air. Musculoskeletal: No acute bony abnormality. IMPRESSION: Severe diffuse fatty infiltration of the liver. Aortic atherosclerosis. No acute findings in the abdomen or pelvis. Electronically Signed   By: Charlett Nose M.D.   On: 03/02/2018 20:52   US Abdomen Limited Ruq  Result Date: 03/02/2018 CLINICAL DATA:  Abdominal pain EXAM: ULTRASOUND ABDOMEN LIMITED RIGHT UPPER QUADRANT COMPARISON:  CT performed today FINDINGS: Gallbladder: No gallstones or wall thickening visualized. No sonographic Murphy sign noted by sonographer. Common bile duct: Diameter: Borderline in diameter, 8 mm. Liver: Increased echotexture compatible with fatty infiltration. No focal abnormality or biliary ductal dilatation. Portal vein is patent on color Doppler imaging with normal direction of blood flow towards the liver. IMPRESSION: Fatty infiltration of the liver. Borderline diameter of the common bile duct, 8 mm. No  evidence of cholelithiasis or cholecystitis. Electronically Signed   By: Charlett Nose M.D.   On: 03/02/2018 23:09    Pending Labs Unresulted Labs (From admission, onward)    Start     Ordered   03/03/18 0500  HIV antibody (Routine Testing)  Tomorrow morning,   R     03/03/18 0014   03/03/18 0500  Basic metabolic panel  Tomorrow morning,   R     03/03/18 0014   03/03/18 0500  Hepatic function panel  Tomorrow morning,   R     03/03/18 0014   03/03/18 0500  CBC WITH DIFFERENTIAL  Tomorrow morning,   R     03/03/18 0014   03/03/18 0043  Urine rapid drug screen (hosp performed)  ONCE - STAT,   R     03/03/18 0042   03/02/18 1835  Urine culture  ONCE - STAT,   STAT     03/02/18 1834          Vitals/Pain Today's Vitals   03/02/18 2330 03/02/18 2342 03/03/18 0000 03/03/18 0030  BP: (!) 163/98  (!) 167/91 (!) 157/88  Pulse: 78  79 83  Resp: Temp:      TempSrc:      SpO2: 97%  98% 96%  Weight:      Height:      PainSc:  7  Isolation Precautions No active isolations  Medications Medications  iopamidol (ISOVUE-300) 61 % injection (has no administration in time range)  sodium chloride (PF) 0.9 % injection (has no administration in time range)  amLODipine (NORVASC) tablet 5 mg (has no administration in time range)  lisinopril (PRINIVIL,ZESTRIL) tablet 10 mg (has no administration in time range)  metoprolol succinate (TOPROL-XL) 24 hr tablet 50 mg (has no administration in time range)  acetaminophen (TYLENOL) tablet 650 mg (has no administration in time range)    Or  acetaminophen (TYLENOL) suppository 650 mg (has no administration in time range)  ondansetron (ZOFRAN) tablet 4 mg ( Oral See Alternative 03/03/18 0044)    Or  ondansetron (ZOFRAN) injection 4 mg (4 mg Intravenous Given 03/03/18 0044)  0.9 %  sodium chloride infusion ( Intravenous New Bag/Given 03/03/18 0044)  pantoprazole (PROTONIX) injection 40 mg (40 mg Intravenous Given 03/03/18 0046)  hydrALAZINE  (APRESOLINE) injection 5 mg (has no administration in time range)  HYDROmorphone (DILAUDID) injection 1 mg (1 mg Intravenous Given 03/02/18 1900)  ondansetron (ZOFRAN) injection 4 mg (4 mg Intravenous Given 03/02/18 1859)  sodium chloride 0.9 % bolus 500 mL (0 mLs Intravenous Stopped 03/02/18 2145)  HYDROmorphone (DILAUDID) injection 1 mg (1 mg Intravenous Given 03/02/18 2026)  iopamidol (ISOVUE-300) 61 % injection 100 mL (100 mLs Intravenous Contrast Given 03/02/18 2039)  labetalol (NORMODYNE,TRANDATE) injection 10 mg (10 mg Intravenous Given 03/02/18 2228)  HYDROmorphone (DILAUDID) injection 1 mg (1 mg Intravenous Given 03/02/18 2225)  promethazine (PHENERGAN) injection 25 mg (25 mg Intravenous Given 03/02/18 2229)    Mobility walks with person assist

## 2018-03-03 NOTE — Consult Note (Signed)
[facility] Consult  Referring Provider: Shaune Pollack, MD/ER Primary Care Physician:  Tower, Audrie Gallus, MD Primary Gastroenterologist: Gentry Fitz  Reason for Consultation:  Abnormal LFTs, dilated CBD  HPI: Nicole Hughes is a 48 y.o. female presented to the ER with acute onset of right flank, upper back pain of 5 hours duration associated with nausea and multiple episodes of vomiting.  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  She also smokes marijuana on a daily basis.  She is on hydrocodone for Achilles tendinitis and endometriosis.  Patient states she has had an EGD and colonoscopy several years ago,unclear indication, reports them both being normal.  She reports regular bowel movements, denies blood in stool or black stools. States she has been gaining several pounds over the last few years. She has acid reflux and takes omeprazole, denies difficulty swallowing or pain on swallowing. She takes ibuprofen 800 mg on a regular basis along with aspirin as needed.    Past Medical History:  Diagnosis Date  . Anxiety   . Cardiomyopathy (HCC)   . Chronic lower back pain   . Complication of anesthesia    "hard to get under"  . Cyst of ovary   . Depression    hx  . DVT complicating pregnancy 08/1998   RLE; RUE  . Dysmenorrhea   . Dyspareunia   . 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 GERD (gastroesophageal reflux disease)   . Hay fever    "fall and spring" (02/17/2017)  . Headache    "2-3 times/.wik" (02/17/2017)  . History of chicken pox   . Hypertension   . Migraine    "monthly" (02/17/2017)  . Pelvic pain   . Pulmonary embolism (HCC) 08/1998   S/P childbirth    Past Surgical History:  Procedure Laterality Date  . CESAREAN SECTION  1994  . ECTOPIC PREGNANCY SURGERY  2010  .  LAPAROSCOPY N/A 09/24/2012   Procedure: OPERATIVE LAPROSCOPY WITH LYSIS OF ADHESIONS;  Surgeon: Sherron Monday, MD;  Location: [facility];  Service: Gynecology;  Laterality: N/A;  . TONSILLECTOMY  1984  . TUBAL LIGATION  2010    Prior to Admission medications   Medication Sig Start Date End Date Taking? Authorizing Provider  amLODipine (NORVASC) 5 MG tablet TAKE 1 TABLET BY MOUTH ONCE DAILY Patient taking differently: Take 5 mg by mouth daily.  08/13/17  Yes Tower, Audrie Gallus, MD  ibuprofen (ADVIL,MOTRIN) 200 MG tablet Take 400 mg by mouth every 6 (six) hours as needed for headache or mild pain.   Yes [provider]  lisinopril (PRINIVIL,ZESTRIL) 10 MG tablet TAKE 1 TABLET BY MOUTH ONCE DAILY Patient taking differently: Take 10 mg by mouth daily.  08/13/17  Yes Tower, Audrie Gallus, MD  metoprolol succinate (TOPROL-XL) 50 MG 24 hr tablet Take 1 tablet (50 mg total) by mouth daily. Take with or immediately following a meal. 03/01/18  Yes Tower, Audrie Gallus, MD  Multiple Vitamin (MULTIVITAMIN WITH MINERALS) TABS tablet Take 1 tablet by mouth daily. 02/19/17  Yes Berton Mount I, MD  omeprazole (PRILOSEC) 20 MG capsule Take 1 capsule (20 mg total) by mouth daily. 01/28/18  Yes Tower, Audrie Gallus, MD  HYDROcodone-acetaminophen (NORCO) 5-325 MG tablet Take 1 tablet by mouth every 6 (six) hours as needed for moderate pain. Patient not taking: Reported on 03/02/2018 02/16/18  Asencion Islam, DPM  hydrOXYzine (ATARAX/VISTARIL) 25 MG tablet Take 1 tablet (25 mg total) by mouth every 8 (eight) hours as needed for anxiety or nausea. Patient not taking: Reported on 03/02/2018 01/28/18   Tower, Audrie Gallus, MD  sertraline (ZOLOFT) 50 MG tablet Take 1 tablet (50 mg total) by mouth daily. 03/01/18   Tower, Audrie Gallus, MD    Current Facility-Administered Medications  Medication Dose Route Frequency Provider Last Rate Last Dose  . 0.9 %  sodium chloride infusion   Intravenous Continuous Eduard Clos, MD 100 mL/hr at 03/03/18  0044    . acetaminophen (TYLENOL) tablet 650 mg  650 mg Oral Q6H PRN Eduard Clos, MD   650 mg at 03/03/18 8119   Or  . acetaminophen (TYLENOL) suppository 650 mg  650 mg Rectal Q6H PRN Eduard Clos, MD      . amLODipine (NORVASC) tablet 5 mg  5 mg Oral Daily Eduard Clos, MD   5 mg at 03/03/18 1019  . hydrALAZINE (APRESOLINE) injection 5 mg  5 mg Intravenous Q4H PRN Eduard Clos, MD   5 mg at 03/03/18 1478  . HYDROmorphone (DILAUDID) injection 1 mg  1 mg Intravenous Q3H PRN Leda Gauze, NP   1 mg at 03/03/18 1020  . lisinopril (PRINIVIL,ZESTRIL) tablet 10 mg  10 mg Oral Daily Eduard Clos, MD   10 mg at 03/03/18 1019  . metoprolol succinate (TOPROL-XL) 24 hr tablet 50 mg  50 mg Oral Daily Eduard Clos, MD   50 mg at 03/03/18 1019  . ondansetron (ZOFRAN) tablet 4 mg  4 mg Oral Q6H PRN Eduard Clos, MD       Or  . ondansetron [facility]) injection 4 mg  4 mg Intravenous Q6H PRN Eduard Clos, MD   4 mg at 03/03/18 2956  . pantoprazole (PROTONIX) injection 40 mg  40 mg Intravenous QHS Eduard Clos, MD   40 mg at 03/03/18 0046    Allergies as of 03/02/2018  . (No Known Allergies)    Family History  Problem Relation Age of Onset  . Cancer - Lung Mother   . Heart failure Father   . Hypertension Father     Social History   Socioeconomic History  . Marital status: Married    Spouse name: Not on file  . Number of children: Not on file  . Years of education: Not on file  . Highest education level: Not on file  Occupational History  . Not on file  Social Needs  . Financial resource strain: Not on file  . Food insecurity:    Worry: Not on file    Inability: Not on file  . Transportation needs:    Medical: Not on file    Non-medical: Not on file  Tobacco Use  . Smoking status: Never Smoker  . Smokeless tobacco: Never Used  Substance and Sexual Activity  . Alcohol use: Not Currently    Alcohol/week: 5.0  standard drinks    Types: 5 Cans of beer per week    Frequency: Never    Comment: has quit (March 2019)  . Drug use: No  . Sexual activity: Not Currently    Birth control/protection: Surgical  Lifestyle  . Physical activity:    Days per week: Not on file    Minutes per session: Not on file  . Stress: Not on file  Relationships  . Social connections:    Talks on phone: Not  on file    Gets together: Not on file    Attends religious service: Not on file    Active member of club or organization: Not on file    Attends meetings of clubs or organizations: Not on file    Relationship status: Not on file  . Intimate partner violence:    Fear of current or ex partner: Not on file    Emotionally abused: Not on file    Physically abused: Not on file    Forced sexual activity: Not on file  Other Topics Concern  . Not on file  Social History Narrative  . Not on file    Review of Systems: Positive for: GI: Described in detail in HPI.    Gen: Denies any fever, chills, rigors, night sweats, anorexia, fatigue, weakness, malaise, involuntary weight loss, and sleep disorder CV: Denies chest pain, angina, palpitations, syncope, orthopnea, PND, peripheral edema, and claudication. Resp: Denies dyspnea, cough, sputum, wheezing, coughing up blood. GU : Denies urinary burning, blood in urine, urinary frequency, urinary hesitancy, nocturnal urination, and urinary incontinence. MS:  joint pain-bilateral ankles, Denies swelling.  Denies muscle weakness, cramps, atrophy.  Derm: Denies rash, itching, oral ulcerations, hives, unhealing ulcers.  Psych: depression, anxiety, Denies memory loss, suicidal ideation, hallucinations  and confusion. Heme: Denies bruising, bleeding, and enlarged lymph nodes. Neuro:  Denies any headaches, dizziness, paresthesias. Endo:  Denies any problems with DM, thyroid, adrenal function.  Physical Exam: Vital signs in last 24 hours: Temp:  [97.8 F (36.6 C)-99.4 F (37.4  C)] 97.8 F (36.6 C) (02/26 0624) Pulse Rate:  [76-117] 87 (02/26 0718) Resp:  [12-29] 20 (02/26 0624) BP: (149-200)/(78-117) 156/88 (02/26 0718) SpO2:  [96 %-100 %] 100 % (02/26 0624) Weight:  [72.3 kg-74.4 kg] 72.3 kg (02/26 0645)    General:   Tearful, anxious Head:  Normocephalic and atraumatic. Eyes:  Sclera clear, no icterus.   Conjunctiva pink. Ears:  Normal auditory acuity. Nose:  No deformity, discharge,  or lesions. Mouth:  No deformity or lesions.  Oropharynx pink & moist. Neck:  Supple; no masses or thyromegaly. Lungs:  Clear throughout to auscultation.   No wheezes, crackles, or rhonchi. No acute distress. Heart:  Regular rate and rhythm; no murmurs, clicks, rubs,  or gallops. Extremities:  Without clubbing or edema. Neurologic:  Alert and  oriented x4;  grossly normal neurologically. Skin:  Intact without significant lesions or rashes. Psych:  Alert and cooperative. Normal mood and affect. Abdomen:  Soft,diffuse, mild generalized tenderness and nondistended. No masses, hepatosplenomegaly or hernias noted. Normal bowel sounds, without guarding, and without rebound.         Lab Results: Recent Labs    03/02/18 1904 03/03/18 0548  WBC 5.9 4.9  HGB 11.7* 10.8*  HCT 37.2 36.2  PLT 315 231   BMET Recent Labs    03/02/18 1904 03/03/18 0548  NA 135 139  K 3.1* 3.4*  CL 101 107  CO2 18* 24  GLUCOSE 93 96  BUN 5* 6  CREATININE 0.69 0.69  CALCIUM 9.1 8.7*   LFT Recent Labs    03/03/18 0548  PROT 7.0  ALBUMIN 4.3  AST 136*  ALT 77*  ALKPHOS 48  BILITOT 1.2  BILIDIR 0.2  IBILI 1.0*   PT/INR No results for input(s): LABPROT, INR in the last 72 hours.  Studies/Results: Dg Chest 2 View  Result Date: 03/02/2018 CLINICAL DATA:  Right upper quadrant pain EXAM: CHEST - 2 VIEW COMPARISON:  01/26/2018 FINDINGS: Heart  and mediastinal contours are within normal limits. No focal opacities or effusions. No acute bony abnormality. IMPRESSION: No active  cardiopulmonary disease. Electronically Signed   By: Charlett Nose M.D.   On: 03/02/2018 22:22   Ct Abdomen Pelvis W Contrast  Result Date: 03/02/2018 CLINICAL DATA:  Bilateral flank pain EXAM: CT ABDOMEN AND PELVIS WITH CONTRAST TECHNIQUE: Multidetector CT imaging of the abdomen and pelvis was performed using the standard protocol following bolus administration of intravenous contrast. CONTRAST:  ISOVUE-300 IOPAMIDOL (ISOVUE-300) INJECTION 61% COMPARISON:  01/26/2018 FINDINGS: Lower chest: Lung bases are clear. No effusions. Heart is normal size. Hepatobiliary: Severe diffuse fatty infiltration of the liver. No visible focal abnormality. Gallbladder unremarkable. Pancreas: No focal abnormality or ductal dilatation. Spleen: No focal abnormality.  Normal size. Adrenals/Urinary Tract: No adrenal abnormality. No focal renal abnormality. No stones or hydronephrosis. Urinary bladder is unremarkable. Stomach/Bowel: Appendix is normal. Stomach, large and small bowel grossly unremarkable. Vascular/Lymphatic: Aortic atherosclerosis. No enlarged abdominal or pelvic lymph nodes. Reproductive: Uterus and adnexa unremarkable. No mass. Tampon within the vagina. Other: No free fluid or free air. Musculoskeletal: No acute bony abnormality. IMPRESSION: Severe diffuse fatty infiltration of the liver. Aortic atherosclerosis. No acute findings in the abdomen or pelvis. Electronically Signed   By: Charlett Nose M.D.   On: 03/02/2018 20:52   US Abdomen Limited Ruq  Result Date: 03/02/2018 CLINICAL DATA:  Abdominal pain EXAM: ULTRASOUND ABDOMEN LIMITED RIGHT UPPER QUADRANT COMPARISON:  CT performed today FINDINGS: Gallbladder: No gallstones or wall thickening visualized. No sonographic Murphy sign noted by sonographer. Common bile duct: Diameter: Borderline in diameter, 8 mm. Liver: Increased echotexture compatible with fatty infiltration. No focal abnormality or biliary ductal dilatation. Portal vein is patent on color  Doppler imaging with normal direction of blood flow towards the liver. IMPRESSION: Fatty infiltration of the liver. Borderline diameter of the common bile duct, 8 mm. No evidence of cholelithiasis or cholecystitis. Electronically Signed   By: Charlett Nose M.D.   On: 03/02/2018 23:09    Impression: Right upper back/flank pain of unknown origin.  Alcohol abuse Abnormal liver enzymes,AST 136/AST 77 compatible with  Alcoholic hepatitis Severe diffuse fatty infiltration of liver  Cyclical vomiting syndrome, chronic  Marijuana use History of anxiety and depression?possible fibromyalgia  Plan: CBD 8 mm(could be related to chronic narcotic use) with no evidence of  Gallstones or gallbladder wall with thickening The patient's pain cannot be explained based on imaging Possible drug seeking behavior Advised abstinence from alcohol as well as marijuana use Will get PT to calculate Hepatic discriminant function, although TB is normal. Full liquid diet, advance as tolerated.    LOS: 0 days   Kerin Salen, MD  03/03/2018, 11:45 AM  Pager [phone] If no answer or after 5 PM call [phone]

## 2018-03-03 NOTE — Progress Notes (Signed)
PROGRESS NOTE  Nicole Hughes DJS:[id number] DOB: 1970/07/25 DOA: 03/02/2018 PCP: Judy Pimple, MD   LOS: 0 days   Brief narrative: Patient is a 48 y.o. female  with past medical history of alcohol abuse (quit 6-8 weeks ago), alcoholic pancreatitis last year, daily marijuana use, GERD and also uses hydrocodone and ibuprofen on a regular basis for Achilles tendinitis and endometriosis. She presented to the ER with acute onset of right flank, upper back pain of 5 hours duration associated with nausea and multiple episodes of vomiting.  In the ER, labs revealed elevated AST of 130, ALT of 86 and total bilirubin 1. Lipase was normal. CT abdomen pelvis was unremarkable. Ultrasound abdomen showed borderline dilated CBD as well as fatty infiltration of liver. Patient was admitted for intractable abdominal pain and nausea vomiting.    Subjective: Patient was seen and examined this morning.  Middle-aged Caucasian female, looks older for her age.  In mild distress because of abdominal pain.  Assessment/Plan:  Principal Problem:   Nausea & vomiting Active Problems:   Hypertension   Hypertensive emergency   Elevated LFTs  Alcoholic hepatitis -presented with nausea, vomiting no abdominal pain.  Elevated AST/ALT of 136/77 and severe diffuse fatty infiltration of liver.  GI consult appreciated.  Full liquid diet started.  Advance as tolerated.  Will stop IV pain medicines and start on as needed oral pain medicines.  Continue IV fluids.  Monitor labs.  Chronic alcohol abuse - Although patient states he quit alcohol 6 to 8 weeks ago, questionable reliability.  Dilated CBD -8 mm dilation.  No evidence of gallstones or gallbladder wall thickening.  Chronic narcotic use may be the cause of dilatation.  Hypertensive urgency -blood pressure improved with pain relief.  Continue patient's home medications including ACE inhibitor beta-blocker and amlodipine.  PRN IV hydralazine for systolic more than  160.  Daily marijuana use -counseled to quit.  Mobility: Encourage mobility Diet: Full liquid diet DVT prophylaxis:  SCDs Code Status:   Code Status: Full Code  Family Communication:  Family not at bedside today Disposition Plan:  Home tomorrow if improvement.  Consultants:  GI.  Antimicrobials:  Anti-infectives (From admission, onward)   None      Infusions:  . sodium chloride 100 mL/hr at 03/03/18 0044    Scheduled Meds: . amLODipine  5 mg Oral Daily  . lisinopril  10 mg Oral Daily  . metoprolol succinate  50 mg Oral Daily  . pantoprazole (PROTONIX) IV  40 mg Intravenous QHS    PRN meds: acetaminophen **OR** acetaminophen, hydrALAZINE, HYDROmorphone (DILAUDID) injection, ondansetron **OR** ondansetron (ZOFRAN) IV   Objective: Vitals:   03/03/18 0718 03/03/18 1405  BP: (!) 156/88 (!) 183/86  Pulse: 87 74  Resp:  18  Temp:  97.6 F (36.4 C)  SpO2:  99%    Intake/Output Summary (Last 24 hours) at 03/03/2018 1457 Last data filed at 03/02/2018 2145 Gross per 24 hour  Intake 500 ml  Output -  Net 500 ml   02/25 0701 - 02/26 0700 In: 500 [IV Piggyback:500] Out: -  No intake/output data recorded. Filed Weights   03/02/18 1813 03/03/18 0645  Weight: 74.4 kg 72.3 kg   Body mass index is 26.52 kg/m.   Physical Exam: GENERAL: Pleasant, middle-aged, Caucasian female.  Mild distress from abdominal pain HENT: No scleral pallor or icterus. Pupils equally reactive to light. Oral mucosa is moist NECK: is supple, no palpable thyroid enlargement. CHEST: Clear to auscultation. No crackles or wheezes.  CVS: S1 and S2 heard, no murmur. Regular rate and rhythm. No pericardial rub. ABDOMEN: Soft, non-tender, bowel sounds are present. No palpable hepato-splenomegaly. EXTREMITIES: No edema. CNS: Alert, awake, oriented times 3 SKIN: warm and dry without rashes.  Data Review: I have personally reviewed the laboratory data and studies available.  Lorin Glass,  MD  [facility] 03/03/2018

## 2018-03-04 DIAGNOSIS — R112 Nausea with vomiting, unspecified: Secondary | ICD-10-CM | POA: Diagnosis not present

## 2018-03-04 DIAGNOSIS — K7010 Alcoholic hepatitis without ascites: Secondary | ICD-10-CM | POA: Diagnosis not present

## 2018-03-04 DIAGNOSIS — R945 Abnormal results of liver function studies: Secondary | ICD-10-CM | POA: Diagnosis not present

## 2018-03-04 LAB — CBC WITH DIFFERENTIAL/PLATELET
Abs Immature Granulocytes: 0.01 10*3/uL (ref 0.00–0.07)
Basophils Absolute: 0 10*3/uL (ref 0.0–0.1)
Basophils Relative: 1 %
Eosinophils Absolute: 0.2 10*3/uL (ref 0.0–0.5)
Eosinophils Relative: 8 %
HCT: 34.6 % — ABNORMAL LOW (ref 36.0–46.0)
Hemoglobin: 10.4 g/dL — ABNORMAL LOW (ref 12.0–15.0)
Immature Granulocytes: 0 %
Lymphocytes Relative: 36 %
Lymphs Abs: 1 10*3/uL (ref 0.7–4.0)
MCH: 26.3 pg (ref 26.0–34.0)
MCHC: 30.1 g/dL (ref 30.0–36.0)
MCV: 87.6 fL (ref 80.0–100.0)
Monocytes Absolute: 0.2 10*3/uL (ref 0.1–1.0)
Monocytes Relative: 8 %
Neutro Abs: 1.3 10*3/uL — ABNORMAL LOW (ref 1.7–7.7)
Neutrophils Relative %: 47 %
Platelets: 200 10*3/uL (ref 150–400)
RBC: 3.95 MIL/uL (ref 3.87–5.11)
RDW: 14.4 % (ref 11.5–15.5)
WBC: 2.9 10*3/uL — ABNORMAL LOW (ref 4.0–10.5)
nRBC: 0 % (ref 0.0–0.2)

## 2018-03-04 LAB — COMPREHENSIVE METABOLIC PANEL
ALT: 103 U/L — ABNORMAL HIGH (ref 0–44)
AST: 213 U/L — ABNORMAL HIGH (ref 15–41)
Albumin: 3.7 g/dL (ref 3.5–5.0)
Alkaline Phosphatase: 47 U/L (ref 38–126)
Anion gap: 8 (ref 5–15)
BUN: <5 mg/dL — ABNORMAL LOW (ref 6–20)
CO2: 21 mmol/L — ABNORMAL LOW (ref 22–32)
Calcium: 8.4 mg/dL — ABNORMAL LOW (ref 8.9–10.3)
Chloride: 110 mmol/L (ref 98–111)
Creatinine, Ser: 0.59 mg/dL (ref 0.44–1.00)
GFR calc Af Amer: >60 mL/min (ref >60)
GFR calc non Af Amer: >60 mL/min (ref >60)
Glucose, Bld: 87 mg/dL (ref 70–99)
Potassium: 3.4 mmol/L — ABNORMAL LOW (ref 3.5–5.1)
Sodium: 139 mmol/L (ref 135–145)
Total Bilirubin: 0.7 mg/dL (ref 0.3–1.2)
Total Protein: 6.4 g/dL — ABNORMAL LOW (ref 6.5–8.1)

## 2018-03-04 LAB — MAGNESIUM: Magnesium: 1.8 mg/dL (ref 1.7–2.4)

## 2018-03-04 LAB — URINE CULTURE

## 2018-03-04 LAB — IRON AND TIBC
Iron: 72 ug/dL (ref 28–170)
Saturation Ratios: 15 % (ref 10.4–31.8)
TIBC: 472 ug/dL — ABNORMAL HIGH (ref 250–450)
UIBC: 400 ug/dL

## 2018-03-04 LAB — FERRITIN: Ferritin: 199 ng/mL (ref 11–307)

## 2018-03-04 MED ORDER — KETOROLAC TROMETHAMINE 15 MG/ML IJ SOLN
15.0000 mg | Freq: Four times a day (QID) | INTRAMUSCULAR | Status: DC
  Administered 2018-03-04 – 2018-03-05 (×4): 15 mg via INTRAVENOUS
  Filled 2018-03-04 (×4): qty 1

## 2018-03-04 NOTE — Progress Notes (Signed)
Subjective: The patient was seen and examined at bedside.  Upon entering the room, she was sleeping peacefully. When I woke her up she started complaining of severe right upper back pain and was in tears.  Objective: Vital signs in last 24 hours: Temp:  [97.6 F (36.4 C)-98.5 F (36.9 C)] 98.2 F (36.8 C) (02/27 0433) Pulse Rate:  [74-77] 77 (02/27 0433) Resp:  [12-18] 12 (02/27 0433) BP: (143-183)/(83-89) 156/89 (02/27 0433) SpO2:  [99 %-100 %] 100 % (02/27 0433) Weight:  [72.6 kg] 72.6 kg (02/27 0433) Weight change: -1.814 kg Last BM Date: 03/04/18  KQ:ASUORVI, anxious GENERAL:seems to be in pain ABDOMEN:soft, nondistended, nontender EXTREMITIES:no deformity  Lab Results: Results for orders placed or performed during the hospital encounter of 03/02/18 (from the past 48 hour(s))  Comprehensive metabolic panel     Status: Abnormal   Collection Time: 03/02/18  7:04 PM  Result Value Ref Range   Sodium 135 135 - 145 mmol/L   Potassium 3.1 (L) 3.5 - 5.1 mmol/L   Chloride 101 98 - 111 mmol/L   CO2 18 (L) 22 - 32 mmol/L   Glucose, Bld 93 70 - 99 mg/dL   BUN 5 (L) 6 - 20 mg/dL   Creatinine, Ser 1.53 0.44 - 1.00 mg/dL   Calcium 9.1 8.9 - 79.4 mg/dL   Total Protein 7.6 6.5 - 8.1 g/dL   Albumin 4.6 3.5 - 5.0 g/dL   AST 327 (H) 15 - 41 U/L   ALT 86 (H) 0 - 44 U/L   Alkaline Phosphatase 55 38 - 126 U/L   Total Bilirubin 1.0 0.3 - 1.2 mg/dL   GFR calc non Af Amer >60 >60 mL/min   GFR calc Af Amer >60 >60 mL/min   Anion gap 16 (H) 5 - 15    Comment: Performed at [facility], [address], [place], [place] [address]  CBC with Differential     Status: Abnormal   Collection Time: 03/02/18  7:04 PM  Result Value Ref Range   WBC 5.9 4.0 - 10.5 K/uL   RBC 4.40 3.87 - 5.11 MIL/uL   Hemoglobin 11.7 (L) 12.0 - 15.0 g/dL   HCT 92.9 57.4 - 73.4 %   MCV 84.5 80.0 - 100.0 fL   MCH 26.6 26.0 - 34.0 pg   MCHC 31.5 30.0 - 36.0 g/dL   RDW 03.7 09.6 - 43.8 %   Platelets 315 150 - 400 K/uL   nRBC 0.0 0.0 - 0.2 %   Neutrophils Relative % 51 %   Neutro Abs 2.9 1.7 - 7.7 K/uL   Lymphocytes Relative 39 %   Lymphs Abs 2.3 0.7 - 4.0 K/uL   Monocytes Relative 8 %   Monocytes Absolute 0.5 0.1 - 1.0 K/uL   Eosinophils Relative 1 %   Eosinophils Absolute 0.1 0.0 - 0.5 K/uL   Basophils Relative 1 %   Basophils Absolute 0.1 0.0 - 0.1 K/uL   Immature Granulocytes 0 %   Abs Immature Granulocytes 0.01 0.00 - 0.07 K/uL    Comment: Performed at [facility], [address] [place], [place] [address]  Urinalysis, Routine w reflex microscopic     Status: Abnormal   Collection Time: 03/02/18  7:04 PM  Result Value Ref Range   Color, Urine COLORLESS (A) YELLOW   APPearance CLEAR CLEAR   Specific Gravity, Urine 1.002 (L) 1.005 - 1.030   pH 7.0 5.0 - 8.0   Glucose, UA NEGATIVE NEGATIVE mg/dL  Hgb urine dipstick SMALL (A) NEGATIVE   Bilirubin Urine NEGATIVE NEGATIVE   Ketones, ur NEGATIVE NEGATIVE mg/dL   Protein, ur NEGATIVE NEGATIVE mg/dL   Nitrite NEGATIVE NEGATIVE   Leukocytes,Ua NEGATIVE NEGATIVE   RBC / HPF 0-5 0 - 5 RBC/hpf   WBC, UA 0-5 0 - 5 WBC/hpf   Bacteria, UA RARE (A) NONE SEEN   Squamous Epithelial / LPF 0-5 0 - 5    Comment: Performed at [facility], [address] [place], [place] [address]  Urine culture     Status: Abnormal   Collection Time: 03/02/18  7:04 PM  Result Value Ref Range   Specimen Description      URINE, RANDOM Performed at [facility], [address], [place], [place] [address]    Special Requests      NONE Performed at [facility], [address], [place], [place] [address]    Culture MULTIPLE SPECIES PRESENT, SUGGEST RECOLLECTION (A)    Report Status 03/04/2018 FINAL   Lipase, blood     Status: None   Collection Time: 03/02/18  7:04 PM  Result Value Ref Range   Lipase 50 11 - 51 U/L    Comment: Performed at [facility], [address], [place], [place] [address]  I-Stat beta hCG blood, ED     Status: None   Collection Time: 03/02/18  7:20 PM  Result Value Ref Range   I-stat hCG, quantitative <5.0 <5 mIU/mL   Comment 3            Comment:   GEST. AGE      CONC.  (mIU/mL)   <=1 WEEK        5 - 50     2 WEEKS       50 - 500     3 WEEKS       100 - 10,000     4 WEEKS     1,000 - 30,000        FEMALE AND NON-PREGNANT FEMALE:     LESS THAN 5 mIU/mL   Troponin I - Once     Status: None   Collection Time: 03/02/18 10:08 PM  Result Value Ref Range   Troponin I <0.03 <0.03 ng/mL    Comment: Performed at [facility], [address], [place], [place] [address]  HIV antibody (Routine Testing)     Status: None   Collection Time: 03/03/18  5:48 AM  Result Value Ref Range   HIV Screen 4th Generation wRfx Non Reactive Non Reactive    Comment: (NOTE) Performed At: [facility] [address] [place], [place] [id number] Jolene Schimke MD ON:[id number]   Basic metabolic panel     Status: Abnormal   Collection Time: 03/03/18  5:48 AM  Result Value Ref Range   Sodium 139 135 - 145 mmol/L   Potassium 3.4 (L) 3.5 - 5.1 mmol/L   Chloride 107 98 - 111 mmol/L   CO2 24 22 - 32 mmol/L   Glucose, Bld 96 70 - 99 mg/dL   BUN 6 6 - 20 mg/dL   Creatinine, Ser 4.40 0.44 - 1.00 mg/dL   Calcium 8.7 (L) 8.9 - 10.3 mg/dL   GFR calc non Af Amer >60 >60 mL/min   GFR calc Af Amer >60 >60 mL/min   Anion gap 8 5 - 15    Comment: Performed at [facility], [address] [place],  [place] [id number]  Hepatic function panel     Status: Abnormal   Collection Time: 03/03/18  5:48 AM  Result Value Ref Range   Total Protein 7.0 6.5 - 8.1 g/dL   Albumin 4.3 3.5 - 5.0 g/dL   AST 409136 (H) 15 - 41 U/L   ALT 77 (H) 0 - 44 U/L   Alkaline Phosphatase 48 38 - 126 U/L   Total Bilirubin 1.2 0.3 - 1.2 mg/dL   Bilirubin, Direct 0.2 0.0 - 0.2 mg/dL   Indirect Bilirubin 1.0 (H) 0.3 - 0.9  mg/dL    Comment: Performed at [facility], [address] [place], [place] [address]  CBC WITH DIFFERENTIAL     Status: Abnormal   Collection Time: 03/03/18  5:48 AM  Result Value Ref Range   WBC 4.9 4.0 - 10.5 K/uL   RBC 4.11 3.87 - 5.11 MIL/uL   Hemoglobin 10.8 (L) 12.0 - 15.0 g/dL   HCT 47.836.2 29.536.0 - 62.146.0 %   MCV 88.1 80.0 - 100.0 fL   MCH 26.3 26.0 - 34.0 pg   MCHC 29.8 (L) 30.0 - 36.0 g/dL   RDW 30.814.7 65.711.5 - 84.615.5 %   Platelets 231 150 - 400 K/uL   nRBC 0.0 0.0 - 0.2 %   Neutrophils Relative % 42 %   Neutro Abs 2.0 1.7 - 7.7 K/uL   Lymphocytes Relative 43 %   Lymphs Abs 2.1 0.7 - 4.0 K/uL   Monocytes Relative 11 %   Monocytes Absolute 0.5 0.1 - 1.0 K/uL   Eosinophils Relative 3 %   Eosinophils Absolute 0.2 0.0 - 0.5 K/uL   Basophils Relative 1 %   Basophils Absolute 0.1 0.0 - 0.1 K/uL   Immature Granulocytes 0 %   Abs Immature Granulocytes 0.01 0.00 - 0.07 K/uL    Comment: Performed at [facility], [address] [place], [place] [address]  Protime-INR     Status: None   Collection Time: 03/03/18 12:11 PM  Result Value Ref Range   Prothrombin Time 12.7 11.4 - 15.2 seconds   INR 1.0 0.8 - 1.2    Comment: (NOTE) INR goal varies based on device and disease states. Performed at [facility], [address] [place], [place] [address]   CBC with Differential/Platelet     Status: Abnormal   Collection Time: 03/04/18  5:23 AM  Result Value Ref Range   WBC 2.9 (L) 4.0 - 10.5 K/uL   RBC 3.95 3.87 - 5.11 MIL/uL   Hemoglobin 10.4 (L) 12.0 - 15.0 g/dL   HCT 24.434.6 (L) 01.036.0 - 27.246.0 %   MCV 87.6 80.0 - 100.0 fL   MCH 26.3 26.0 - 34.0 pg   MCHC 30.1 30.0 - 36.0 g/dL   RDW 53.614.4 64.411.5 - 03.415.5 %   Platelets 200 150 - 400 K/uL   nRBC 0.0 0.0 - 0.2 %   Neutrophils Relative % 47 %   Neutro Abs 1.3 (L) 1.7 - 7.7 K/uL   Lymphocytes Relative 36 %   Lymphs Abs 1.0 0.7 - 4.0 K/uL   Monocytes Relative 8 %   Monocytes Absolute 0.2  0.1 - 1.0 K/uL   Eosinophils Relative 8 %   Eosinophils Absolute 0.2 0.0 - 0.5 K/uL   Basophils Relative 1 %   Basophils Absolute 0.0 0.0 - 0.1 K/uL   Immature Granulocytes 0 %   Abs Immature Granulocytes 0.01 0.00 - 0.07 K/uL    Comment: Performed at [facility], [address], [place], [place] [address]  Comprehensive metabolic panel     Status: Abnormal   Collection Time: 03/04/18  5:23 AM  Result Value Ref Range   Sodium 139 135 - 145 mmol/L   Potassium 3.4 (L) 3.5 - 5.1 mmol/L   Chloride 110 98 - 111 mmol/L   CO2 21 (L) 22 - 32 mmol/L   Glucose, Bld 87 70 - 99 mg/dL   BUN <5 (L) 6 - 20 mg/dL   Creatinine, Ser 6.04 0.44 - 1.00 mg/dL   Calcium 8.4 (L) 8.9 - 10.3 mg/dL   Total Protein 6.4 (L) 6.5 - 8.1 g/dL   Albumin 3.7 3.5 - 5.0 g/dL   AST 540 (H) 15 - 41 U/L   ALT 103 (H) 0 - 44 U/L   Alkaline Phosphatase 47 38 - 126 U/L   Total Bilirubin 0.7 0.3 - 1.2 mg/dL   GFR calc non Af Amer >60 >60 mL/min   GFR calc Af Amer >60 >60 mL/min   Anion gap 8 5 - 15    Comment: Performed at [facility], [address], [place], [place] [address]  Magnesium     Status: None   Collection Time: 03/04/18  5:23 AM  Result Value Ref Range   Magnesium 1.8 1.7 - 2.4 mg/dL    Comment: Performed at [facility], [address], [place], [place] [address]    Studies/Results: Dg Chest 2 View  Result Date: 03/02/2018 CLINICAL DATA:  Right upper quadrant pain EXAM: CHEST - 2 VIEW COMPARISON:  01/26/2018 FINDINGS: Heart and mediastinal contours are within normal limits. No focal opacities or effusions. No acute bony abnormality. IMPRESSION: No active cardiopulmonary disease. Electronically Signed   By: Charlett Nose M.D.   On: 03/02/2018 22:22   Ct Abdomen Pelvis W Contrast  Result Date: 03/02/2018 CLINICAL DATA:  Bilateral flank pain EXAM: CT ABDOMEN AND PELVIS WITH CONTRAST TECHNIQUE: Multidetector CT imaging of the abdomen and pelvis was  performed using the standard protocol following bolus administration of intravenous contrast. CONTRAST:  ISOVUE-300 IOPAMIDOL (ISOVUE-300) INJECTION 61% COMPARISON:  01/26/2018 FINDINGS: Lower chest: Lung bases are clear. No effusions. Heart is normal size. Hepatobiliary: Severe diffuse fatty infiltration of the liver. No visible focal abnormality. Gallbladder unremarkable. Pancreas: No focal abnormality or ductal dilatation. Spleen: No focal abnormality.  Normal size. Adrenals/Urinary Tract: No adrenal abnormality. No focal renal abnormality. No stones or hydronephrosis. Urinary bladder is unremarkable. Stomach/Bowel: Appendix is normal. Stomach, large and small bowel grossly unremarkable. Vascular/Lymphatic: Aortic atherosclerosis. No enlarged abdominal or pelvic lymph nodes. Reproductive: Uterus and adnexa unremarkable. No mass. Tampon within the vagina. Other: No free fluid or free air. Musculoskeletal: No acute bony abnormality. IMPRESSION: Severe diffuse fatty infiltration of the liver. Aortic atherosclerosis. No acute findings in the abdomen or pelvis. Electronically Signed   By: Charlett Nose M.D.   On: 03/02/2018 20:52   US Abdomen Limited Ruq  Result Date: 03/02/2018 CLINICAL DATA:  Abdominal pain EXAM: ULTRASOUND ABDOMEN LIMITED RIGHT UPPER QUADRANT COMPARISON:  CT performed today FINDINGS: Gallbladder: No gallstones or wall thickening visualized. No sonographic Murphy sign noted by sonographer. Common bile duct: Diameter: Borderline in diameter, 8 mm. Liver: Increased echotexture compatible with fatty infiltration. No focal abnormality or biliary ductal dilatation. Portal vein is patent on color Doppler imaging with normal direction of blood flow towards the liver. IMPRESSION: Fatty infiltration of the liver. Borderline diameter of the common bile duct, 8 mm. No  evidence of cholelithiasis or cholecystitis. Electronically Signed   By: Charlett Nose M.D.   On: 03/02/2018 23:09    Medications: I  have reviewed the patient's current medications.  Assessment: Abnormal liver enzymes, AST more elevated than AST, typical of alcoholic hepatitis PT normal, hepatic discriminant function normal, no indication for prednisolone or pentoxifylline  Unexplained right upper back pain  Plan: Advance diet to regular Will send further labs to complete workup for chronic liver disease( iron panel, alpha 1 antitrypsin, ceruloplasmin,ANA, AMA, ASMA). I strongly believe that patient is seeking pain medications, as her pain cannot be explained on the basis of imaging or labs. She states that IV Dilaudid helps her pain, while oral narcotics do not. No further episodes of nausea or vomiting.  Follow-up labs for elevated liver enzymes as an outpatient. Recommend abstinence from alcohol use, quitting marijuana use. Will sign off from GI standpoint.   Kerin Salen 03/04/2018, 11:50 AM   Pager [phone] If no answer or after 5 PM call [phone]

## 2018-03-04 NOTE — Progress Notes (Signed)
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 She presented to the ER with acute onset of right flank, upper back pain of 5 hours duration associated with nausea and multiple episodes of vomiting.  In the ER, labs revealed elevated AST of 130, ALT of 86 and total bilirubin 1. Lipase was normal.CT abdomen pelvis was unremarkable. Ultrasound abdomen showed borderline dilated CBD as well as fatty infiltration of liver. Patient was admitted for intractable abdominal pain, back pain and nausea vomiting.   Subjective: Patient was seen and examined this morning.  In tears from pain.  States that she has been evaluated multiple times in the past for unexplained back pain, unexplained headache.  Fibromyalgia was considered and she is on Norco at home.  She does not think she is getting tolerance to it.   Assessment/Plan:  Principal Problem:   Nausea & vomiting Active Problems:   Hypertension   Hypertensive emergency   Elevated LFTs   Nausea and vomiting  Alcoholic hepatitis -presented with nausea, vomiting no abdominal pain.  Elevated AST/ALT of 136/77 and severe diffuse fatty infiltration of liver. GI consult appreciated. Liver enzymes trending up today.  No indication of pentoxifylline or prednisone.  Diet advanced to regular. GI sent for labs to complete work-up for chronic liver disease (iron panel, alpha-1 antitrypsin, ceruloplasmin, ANA, AMA, ASMA). Will avoid IV narcotics.  Chronic alcohol abuse - Although patient states he quit alcohol 6 to 8 weeks ago, questionable reliability.  No symptoms or signs of withdrawal at this time  Dilated CBD -8  mm dilation.  No evidence of gallstones or gallbladder wall thickening.  Chronic narcotic use may be the cause of dilatation.  Hypertensive urgency -blood pressure improved with pain relief. Continue patient's home medications including ACE inhibitor beta-blocker and amlodipine. PRN IV hydralazine for systolic more than 160.  Daily marijuana use -counseled to quit.  Mobility: Encourage mobility Diet: Full liquid diet DVT prophylaxis: SCDs Code Status:  Code Status: Full Code  Family Communication: Family not at bedside today Disposition Plan: Home tomorrow if improvement.  Consultants:  GI  Procedures:  None  Antimicrobials:  Anti-infectives (From admission, onward)   None      Infusions: None   Scheduled Meds: . amLODipine  5 mg Oral Daily  . ketorolac  15 mg Intravenous Q6H  . lisinopril  10 mg Oral Daily  . metoprolol succinate  50 mg Oral Daily  . pantoprazole (PROTONIX) IV  40 mg Intravenous QHS    PRN meds: acetaminophen **OR** acetaminophen, hydrALAZINE, ondansetron **OR** ondansetron (ZOFRAN) IV, oxyCODONE, zolpidem   Objective: Vitals:   03/03/18 2305 03/04/18 0433  BP: (!) 143/83 (!) 156/89  Pulse: 75 77  Resp: 14 12  Temp: 98.5 F (36.9 C) 98.2 F (36.8 C)  SpO2: 99% 100%    Intake/Output Summary (Last 24 hours) at 03/04/2018 1523 Last data filed at 03/04/2018 1032 Gross per 24 hour  Intake 1925.31 ml  Output -  Net 1925.31 ml   Filed Weights   03/02/18 1813 03/03/18 0645 03/04/18 0433  Weight: 74.4 kg 72.3 kg 72.6 kg   Body mass index is 26.63 kg/m.   Physical Exam: General exam: Tearful  in pain Skin: No rashes, lesions or ulcers. HEENT: Oral exam normal Respiratory system: Clear to auscultation bilaterally Cardiovascular system: Regular rate and rhythm, no murmur Gastrointestinal system: Soft, mild tenderness in epigastric area, nondistended, bowel sound present Central nervous system: Alert, awake, oriented x3 Psychiatry:  Mood & affect appropriate.  Extremities: No pedal edema, no calf tenderness  Data Review: I have personally reviewed the laboratory data and studies available.  Recent Labs  Lab 03/02/18 1904 03/03/18 0548 03/04/18 0523  WBC 5.9 4.9 2.9*  NEUTROABS 2.9 2.0 1.3*  HGB 11.7* 10.8* 10.4*  HCT 37.2 36.2 34.6*  MCV 84.5 88.1 87.6  PLT 315 231 200   Recent Labs  Lab 03/02/18 1904 03/03/18 0548 03/04/18 0523  NA 135 139 139  K 3.1* 3.4* 3.4*  CL 101 107 110  CO2 18* 24 21*  GLUCOSE 93 96 87  BUN 5* 6 <5*  CREATININE 0.69 0.69 0.59  CALCIUM 9.1 8.7* 8.4*  MG  --   --  1.8    Lorin Glass, MD  [facility] 03/04/2018

## 2018-03-05 DIAGNOSIS — K7010 Alcoholic hepatitis without ascites: Secondary | ICD-10-CM

## 2018-03-05 DIAGNOSIS — R112 Nausea with vomiting, unspecified: Secondary | ICD-10-CM | POA: Diagnosis not present

## 2018-03-05 LAB — COMPREHENSIVE METABOLIC PANEL
ALT: 133 U/L — ABNORMAL HIGH (ref 0–44)
AST: 279 U/L — ABNORMAL HIGH (ref 15–41)
Albumin: 4.5 g/dL (ref 3.5–5.0)
Alkaline Phosphatase: 49 U/L (ref 38–126)
Anion gap: 8 (ref 5–15)
BUN: 5 mg/dL — ABNORMAL LOW (ref 6–20)
CO2: 22 mmol/L (ref 22–32)
Calcium: 9.2 mg/dL (ref 8.9–10.3)
Chloride: 108 mmol/L (ref 98–111)
Creatinine, Ser: 0.54 mg/dL (ref 0.44–1.00)
GFR calc Af Amer: >60 mL/min (ref >60)
GFR calc non Af Amer: >60 mL/min (ref >60)
Glucose, Bld: 95 mg/dL (ref 70–99)
Potassium: 3.4 mmol/L — ABNORMAL LOW (ref 3.5–5.1)
Sodium: 138 mmol/L (ref 135–145)
Total Bilirubin: 0.7 mg/dL (ref 0.3–1.2)
Total Protein: 7.1 g/dL (ref 6.5–8.1)

## 2018-03-05 LAB — CBC WITH DIFFERENTIAL/PLATELET
Abs Immature Granulocytes: 0.01 10*3/uL (ref 0.00–0.07)
Basophils Absolute: 0 10*3/uL (ref 0.0–0.1)
Basophils Relative: 1 %
Eosinophils Absolute: 0.4 10*3/uL (ref 0.0–0.5)
Eosinophils Relative: 9 %
HCT: 37.1 % (ref 36.0–46.0)
Hemoglobin: 11.4 g/dL — ABNORMAL LOW (ref 12.0–15.0)
Immature Granulocytes: 0 %
Lymphocytes Relative: 32 %
Lymphs Abs: 1.3 10*3/uL (ref 0.7–4.0)
MCH: 26.4 pg (ref 26.0–34.0)
MCHC: 30.7 g/dL (ref 30.0–36.0)
MCV: 85.9 fL (ref 80.0–100.0)
Monocytes Absolute: 0.3 10*3/uL (ref 0.1–1.0)
Monocytes Relative: 8 %
Neutro Abs: 2.1 10*3/uL (ref 1.7–7.7)
Neutrophils Relative %: 50 %
Platelets: 237 10*3/uL (ref 150–400)
RBC: 4.32 MIL/uL (ref 3.87–5.11)
RDW: 14.3 % (ref 11.5–15.5)
WBC: 4.1 10*3/uL (ref 4.0–10.5)
nRBC: 0 % (ref 0.0–0.2)

## 2018-03-05 LAB — HEPATITIS PANEL, ACUTE
HCV Ab: <0.1 s/co ratio (ref 0.0–0.9)
Hep A IgM: NEGATIVE
Hep B C IgM: NEGATIVE
Hepatitis B Surface Ag: NEGATIVE

## 2018-03-05 LAB — ANTI-SMOOTH MUSCLE ANTIBODY, IGG: F-Actin IgG: 5 Units (ref 0–19)

## 2018-03-05 LAB — CERULOPLASMIN: Ceruloplasmin: 23.9 mg/dL (ref 19.0–39.0)

## 2018-03-05 LAB — ALPHA-1-ANTITRYPSIN: A-1 Antitrypsin, Ser: 133 mg/dL (ref 101–187)

## 2018-03-05 LAB — MITOCHONDRIAL ANTIBODIES: Mitochondrial M2 Ab, IgG: <20 Units (ref 0.0–20.0)

## 2018-03-05 LAB — ANTINUCLEAR ANTIBODIES, IFA: ANA Ab, IFA: NEGATIVE

## 2018-03-05 MED ORDER — MIRTAZAPINE 15 MG PO TABS: 15.0000 mg | ORAL_TABLET | Freq: Every day | ORAL | 0 refills | Status: DC

## 2018-03-05 MED ORDER — LISINOPRIL 10 MG PO TABS: 20.0000 mg | ORAL_TABLET | Freq: Every day | ORAL | 0 refills | Status: DC

## 2018-03-05 MED ORDER — HYDROCODONE-ACETAMINOPHEN 5-325 MG PO TABS: 1.0000 | ORAL_TABLET | Freq: Four times a day (QID) | ORAL | 0 refills | Status: AC | PRN

## 2018-03-05 MED ORDER — AMLODIPINE BESYLATE 5 MG PO TABS: 10.0000 mg | ORAL_TABLET | Freq: Every day | ORAL | 0 refills | Status: DC

## 2018-03-05 NOTE — Discharge Summary (Signed)
Physician Discharge Summary  Nicole Hughes QVZ:[id number] DOB: [birth year] DOA: 03/02/2018  PCP: Judy Pimple, MD  Admit date: 03/02/2018 Discharge date: 03/05/2018  Admitted From: Home Discharge disposition: home   Code Status: Full Code   Recommendations for Outpatient Follow-Up:   1. 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 Shepresented to the ER with acute onset of right flank, upper back pain of 5 hours duration associated with nausea and multiple episodes of vomiting.  In the ER,labs revealed elevated AST of 130,ALT of 86 and total bilirubin 1. Lipase was normal.CT abdomen pelvis was unremarkable. Ultrasound abdomenshowedborderline dilated CBD aswell as fatty infiltration of liver. Patientwasadmitted forintractableabdominal pain, back pain and nausea vomiting.   Patient was admitted because of intractable symptoms and elevated liver enzymes.  Hospital Course:  Alcoholic hepatitis-presented with nausea, vomiting no abdominal pain. Elevated AST/ALT of 136/77 and severe diffuse fatty infiltration of liver. GI consult appreciated.  Patient initially stated that she quit drinking alcohol 6 to 8 weeks ago.  She later admitted that she took 2-3 drinks few days prior to presentation.  Elevated liver enzymes and her symptoms are mostly secondary to alcohol hepatitis.  I discussed the case again with GI Dr. Marca Ancona this morning.  Patient's AST and ALT continue to trend up to 279/133 today.  However, her total bilirubin and INR are normal.  Hepatic discriminant function  normal.  No indication of pentoxifylline or prednisone.    Patient has been tolerating regular diet without nausea and vomiting for last 24 hours.  GI sent for labs to complete work-up for chronic liver disease (iron panel, alpha-1 antitrypsin, ceruloplasmin, ANA, AMA, ASMA).  At this time, patient does not have any need of inpatient stay.  She will follow-up with GI as an outpatient.  Chronic alcohol abuse-counseled to completely quit.  She seems to have relapsed recently.  No symptoms or signs of withdrawal at this time  Dilated CBD-8 mm dilation. No evidence of gallstones or gallbladder wall thickening. Chronic narcotic use may be the cause of dilatation.  Uncontrolled hypertension -prior to admission, patient was taking amlodipine 5 mg daily, lisinopril 10 mg daily and Toprol 50 mg daily.  Despite continuation of those medications, patient's blood pressure has been consistently over 150 systolic.  I will increase her amlodipine to 10 mg daily and lisinopril to 20 mg daily at discharge.  Chronic pain -patient states that she has chronic neck pain, back pain, headache and leg pains.  In the past, she states she has been suspected of fibromyalgia.  She takes oxycodone at home, given by her primary care provider.  In the hospital, there were times when pain medicine seeking behavior was suspected.  Patient states to me today that she has run out of her pain medicines.  I will give her oxycodone for only 3 days.  She has an appointment with her primary care provider on Monday 3/2.  Patient also asked for Ambien for sleep.  I would note, and BZD and opioids.  I offered her Remeron for 3 days which she agreed to.  Daily marijuana use-counseled to quit.  Stable to discharge to home  today.  Medical Consultants:    GI   Discharge Exam:   Vitals:   03/04/18 2233 03/05/18 0500 03/05/18 0502 03/05/18 1011  BP: (!) 140/91  (!) 158/95 (!) 162/98  Pulse: 80  85 75  Resp:   20   Temp:   98.1  F (36.7 C)   TempSrc:   Oral   SpO2:   100%   Weight:  70.9 kg    Height:        Body mass index is 26.01 kg/m.  General exam: Appears calm and comfortable.  Gets tearful at times when talking about her alcohol habit Skin: No rashes, lesions or ulcers. Respiratory system: Clear to auscultation bilaterally Cardiovascular system: Regular rate and rhythm, no murmur Gastrointestinal system: Soft, nontender, nondistended, bowel sound present Central nervous system: Alert, awake monitor x3, no alcohol-related tremors Psychiatry: Mood & affect appropriate.  Extremities: No pedal edema, no calf tenderness   Discharge Instructions:  Wound care: None Discharge Instructions    Diet - low sodium heart healthy   Complete by:  As directed    Increase activity slowly   Complete by:  As directed       Allergies as of 03/05/2018   No Known Allergies     Medication List    STOP taking these medications   ibuprofen 200 MG tablet Commonly known as:  ADVIL,MOTRIN     TAKE these medications   amLODipine 5 MG tablet Commonly known as:  NORVASC Take 2 tablets (10 mg total) by mouth daily. What changed:  how much to take   HYDROcodone-acetaminophen 5-325 MG tablet Commonly known as:  NORCO Take 1 tablet by mouth every 6 (six) hours as needed for up to 3 days for moderate pain.   hydrOXYzine 25 MG tablet Commonly known as:  ATARAX/VISTARIL Take 1 tablet (25 mg total) by mouth every 8 (eight) hours as needed for anxiety or nausea.   lisinopril 10 MG tablet Commonly known as:  PRINIVIL,ZESTRIL Take 2 tablets (20 mg total) by mouth daily for 30 days. What changed:  how much to take   metoprolol succinate 50 MG 24 hr tablet Commonly known as:  TOPROL-XL Take 1 tablet (50 mg total) by mouth daily. Take with or immediately following a meal.   mirtazapine 15 MG tablet Commonly known as:  REMERON Take 1 tablet (15 mg total) by mouth at bedtime for 3 days.   multivitamin with  minerals Tabs tablet Take 1 tablet by mouth daily.   omeprazole 20 MG capsule Commonly known as:  PRILOSEC Take 1 capsule (20 mg total) by mouth daily.   sertraline 50 MG tablet Commonly known as:  ZOLOFT Take 1 tablet (50 mg total) by mouth daily.      Time coordinating discharge: 39 minutes  The results of significant diagnostics from this hospitalization (including imaging, microbiology, ancillary and laboratory) are listed below for reference.    Procedures and Diagnostic Studies:   Dg Chest 2 View  Result Date: 03/02/2018 CLINICAL DATA:  Right upper quadrant pain EXAM: CHEST - 2 VIEW COMPARISON:  01/26/2018 FINDINGS: Heart and mediastinal contours are within normal limits. No focal opacities or effusions. No acute bony abnormality. IMPRESSION: No active cardiopulmonary disease. Electronically Signed   By: Charlett Nose M.D.   On: 03/02/2018 22:22   Ct Abdomen Pelvis W Contrast  Result Date: 03/02/2018 CLINICAL DATA:  Bilateral flank pain EXAM: CT ABDOMEN AND PELVIS WITH CONTRAST TECHNIQUE: Multidetector CT imaging of the abdomen and pelvis  was performed using the standard protocol following bolus administration of intravenous contrast. CONTRAST:  ISOVUE-300 IOPAMIDOL (ISOVUE-300) INJECTION 61% COMPARISON:  01/26/2018 FINDINGS: Lower chest: Lung bases are clear. No effusions. Heart is normal size. Hepatobiliary: Severe diffuse fatty infiltration of the liver. No visible focal abnormality. Gallbladder unremarkable. Pancreas: No focal abnormality or ductal dilatation. Spleen: No focal abnormality.  Normal size. Adrenals/Urinary Tract: No adrenal abnormality. No focal renal abnormality. No stones or hydronephrosis. Urinary bladder is unremarkable. Stomach/Bowel: Appendix is normal. Stomach, large and small bowel grossly unremarkable. Vascular/Lymphatic: Aortic atherosclerosis. No enlarged abdominal or pelvic lymph nodes. Reproductive: Uterus and adnexa unremarkable. No mass. Tampon  within the vagina. Other: No free fluid or free air. Musculoskeletal: No acute bony abnormality. IMPRESSION: Severe diffuse fatty infiltration of the liver. Aortic atherosclerosis. No acute findings in the abdomen or pelvis. Electronically Signed   By: Charlett Nose M.D.   On: 03/02/2018 20:52   US Abdomen Limited Ruq  Result Date: 03/02/2018 CLINICAL DATA:  Abdominal pain EXAM: ULTRASOUND ABDOMEN LIMITED RIGHT UPPER QUADRANT COMPARISON:  CT performed today FINDINGS: Gallbladder: No gallstones or wall thickening visualized. No sonographic Murphy sign noted by sonographer. Common bile duct: Diameter: Borderline in diameter, 8 mm. Liver: Increased echotexture compatible with fatty infiltration. No focal abnormality or biliary ductal dilatation. Portal vein is patent on color Doppler imaging with normal direction of blood flow towards the liver. IMPRESSION: Fatty infiltration of the liver. Borderline diameter of the common bile duct, 8 mm. No evidence of cholelithiasis or cholecystitis. Electronically Signed   By: Charlett Nose M.D.   On: 03/02/2018 23:09     Labs:   Basic Metabolic Panel: Recent Labs  Lab 03/02/18 1904 03/03/18 0548 03/04/18 0523 03/05/18 0511  NA 135 139 139 138  K 3.1* 3.4* 3.4* 3.4*  CL 101 107 110 108  CO2 18* 24 21* 22  GLUCOSE 93 96 87 95  BUN 5* 6 <5* 5*  CREATININE 0.69 0.69 0.59 0.54  CALCIUM 9.1 8.7* 8.4* 9.2  MG  --   --  1.8  --    GFR Estimated Creatinine Clearance: 87.8 mL/min (by C-G formula based on SCr of 0.54 mg/dL). Liver Function Tests: Recent Labs  Lab 03/02/18 1904 03/03/18 0548 03/04/18 0523 03/05/18 0511  AST 130* 136* 213* 279*  ALT 86* 77* 103* 133*  ALKPHOS 55 48 47 49  BILITOT 1.0 1.2 0.7 0.7  PROT 7.6 7.0 6.4* 7.1  ALBUMIN 4.6 4.3 3.7 4.5   Recent Labs  Lab 03/02/18 1904  LIPASE 50   No results for input(s): AMMONIA in the last 168 hours. Coagulation profile Recent Labs  Lab 03/03/18 1211  INR 1.0    CBC: Recent Labs    Lab 03/02/18 1904 03/03/18 0548 03/04/18 0523 03/05/18 0511  WBC 5.9 4.9 2.9* 4.1  NEUTROABS 2.9 2.0 1.3* 2.1  HGB 11.7* 10.8* 10.4* 11.4*  HCT 37.2 36.2 34.6* 37.1  MCV 84.5 88.1 87.6 85.9  PLT 315 231 200 237   Cardiac Enzymes: Recent Labs  Lab 03/02/18 2208  TROPONINI <0.03   BNP: Invalid input(s): POCBNP CBG: No results for input(s): GLUCAP in the last 168 hours. D-Dimer No results for input(s): DDIMER in the last 72 hours. Hgb A1c No results for input(s): HGBA1C in the last 72 hours. Lipid Profile No results for input(s): CHOL, HDL, LDLCALC, TRIG, CHOLHDL, LDLDIRECT in the last 72 hours. Thyroid function studies No results for input(s): TSH, T4TOTAL, T3FREE, THYROIDAB in the last 72 hours.  Invalid input(s): FREET3 Anemia work up Recent Labs    03/04/18 0847  FERRITIN 199  TIBC 472*  IRON 72   Microbiology Recent Results (from the past 240 hour(s))  Urine culture     Status: Abnormal   Collection Time: 03/02/18  7:04 PM  Result Value Ref Range Status   Specimen Description   Final    URINE, RANDOM Performed at [facility], [address], [place], [place] [address]    Special Requests   Final    NONE Performed at [facility], [address] [place], [place] [address]    Culture MULTIPLE SPECIES PRESENT, SUGGEST RECOLLECTION (A)  Final   Report Status 03/04/2018 FINAL  Final    Signed: Melina Schools Arvine Clayburn  [facility] 03/05/2018, 12:17 PM

## 2018-03-05 NOTE — Progress Notes (Signed)
Discharge instructions given and explained to patient, she verbalized understanding, denies any pain/distress. Skin intact, no wound  Noted. Accompanied home by family.

## 2018-03-08 ENCOUNTER — Encounter: Payer: Self-pay | Admitting: Family Medicine

## 2018-03-08 ENCOUNTER — Ambulatory Visit: Payer: 59 | Admitting: Family Medicine

## 2018-03-08 VITALS — BP 142/70 | HR 68 | Temp 98.3°F | Ht 65.0 in | Wt 162.6 lb

## 2018-03-08 DIAGNOSIS — M7918 Myalgia, other site: Secondary | ICD-10-CM

## 2018-03-08 DIAGNOSIS — R7989 Other specified abnormal findings of blood chemistry: Secondary | ICD-10-CM

## 2018-03-08 DIAGNOSIS — K589 Irritable bowel syndrome without diarrhea: Secondary | ICD-10-CM | POA: Insufficient documentation

## 2018-03-08 DIAGNOSIS — K219 Gastro-esophageal reflux disease without esophagitis: Secondary | ICD-10-CM

## 2018-03-08 DIAGNOSIS — F1011 Alcohol abuse, in remission: Secondary | ICD-10-CM | POA: Diagnosis not present

## 2018-03-08 DIAGNOSIS — I10 Essential (primary) hypertension: Secondary | ICD-10-CM

## 2018-03-08 DIAGNOSIS — R945 Abnormal results of liver function studies: Secondary | ICD-10-CM

## 2018-03-08 DIAGNOSIS — K7010 Alcoholic hepatitis without ascites: Secondary | ICD-10-CM

## 2018-03-08 DIAGNOSIS — G4701 Insomnia due to medical condition: Secondary | ICD-10-CM

## 2018-03-08 DIAGNOSIS — F418 Other specified anxiety disorders: Secondary | ICD-10-CM

## 2018-03-08 DIAGNOSIS — R630 Anorexia: Secondary | ICD-10-CM | POA: Insufficient documentation

## 2018-03-08 DIAGNOSIS — K582 Mixed irritable bowel syndrome: Secondary | ICD-10-CM

## 2018-03-08 MED ORDER — MIRTAZAPINE 15 MG PO TABS: 15.0000 mg | ORAL_TABLET | Freq: Every day | ORAL | 5 refills | Status: DC

## 2018-03-08 MED ORDER — AMLODIPINE BESYLATE 10 MG PO TABS: 10.0000 mg | ORAL_TABLET | Freq: Every day | ORAL | 11 refills | Status: DC

## 2018-03-08 NOTE — Assessment & Plan Note (Signed)
Suspect due to multiple physical problems and mood issues  remeron px in hospital should help  F/u planned  Wt is stable

## 2018-03-08 NOTE — Assessment & Plan Note (Signed)
remeron has helped Refilled this

## 2018-03-08 NOTE — Assessment & Plan Note (Signed)
Improved on 2nd check BP: (!) 142/70    She is supposed to take 20 mg of lisinopril and did not raise her dose  She will do this and f/u later this mo as planned  Enc good diet/lifestyle habits and etoh avoidance

## 2018-03-08 NOTE — Assessment & Plan Note (Signed)
Intermittent constipation /diarrhea For GI f/u soon

## 2018-03-08 NOTE — Assessment & Plan Note (Signed)
Recent hospitalization  Reviewed hospital records, lab results and studies in detail   clinicall improvement  No etoh use and going to AA meetings regularly  For GI f/u soon (expecting a call) -she saw Dr Marca Ancona in the hospital  abd pain is resolved Disc imp of complete etoh abstinence and pt is aware /agrees  Liver labs were reassuring for other causes of hepatitis

## 2018-03-08 NOTE — Assessment & Plan Note (Signed)
Disc poss of myofacisal pain syndrome in light of widespread body pain / also hx of headaches and IBS Overall sensitive to pain (worse with stress and etoh use)) Poor sleep  Given remeron 15 mg in hospital that helps-this was refilled  Also continue sertraline 50 mg-tolerates well  F/u later this mo Continues AA meetings as well  Disc tx for myofacial pain syndrome includes low impact exercise and good self care  Enc her to get off pain medication

## 2018-03-08 NOTE — Progress Notes (Signed)
Subjective:    Patient ID: Nicole Hughes, female    DOB: 12-27-1970, 48 y.o.   MRN: [id number]  HPI Here for f/u of hospitalization from 2/25 to 2/28 for alcoholic hepatitis w/o ascites  It was recommended she also see GI  She presented with R flank pain and upper back pai  LFT were found elevated but nl lipase  CT abd/pelvis unremarkable  [place] of liver -showed borderline dilate CBD and fatty infiltration   Hospital course as follows:  Alcoholic hepatitis-presented with nausea, vomiting no abdominal pain. Elevated AST/ALT of 136/77 and severe diffuse fatty infiltration of liver. GI consult appreciated. Patient initially stated that she quit drinking alcohol 6 to 8 weeks ago.  She later admitted that she took 2-3 drinks few days prior to presentation.  Elevated liver enzymes and her symptoms are mostly secondary to alcohol hepatitis.  I discussed the case again with GI Dr. Marca Ancona this morning.  Patient's AST and ALT continue to trend up to 279/133 today.  However, her total bilirubin and INR are normal.  Hepatic discriminant function normal.  No indication of pentoxifylline or prednisone.   Patient has been tolerating regular diet without nausea and vomiting for last 24 hours.  GI sent for labs to complete work-up for chronic liver disease (iron panel, alpha-1 antitrypsin, ceruloplasmin, ANA, AMA, ASMA).  At this time, patient does not have any need of inpatient stay.  She will follow-up with GI as an outpatient.  Pt saw Dr Marca Ancona in the hospital and is supposed to hear about outpatient follow up soon   Chronic alcohol abuse-counseled to completely quit.  She seems to have relapsed recently.  No symptoms or signs of withdrawal at this time    Dilated CBD-8 mm dilation. No evidence of gallstones or gallbladder wall thickening. Chronic narcotic use may be the cause of dilatation.  Uncontrolled hypertension -prior to admission, patient was taking amlodipine 5 mg daily, lisinopril 10  mg daily and Toprol 50 mg daily.  Despite continuation of those medications, patient's blood pressure has been consistently over 150 systolic.  I will increase her amlodipine to 10 mg daily and lisinopril to 20 mg daily at discharge.  Chronic pain -patient states that she has chronic neck pain, back pain, headache and leg pains.  In the past, she states she has been suspected of fibromyalgia.  She takes oxycodone at home, given by her primary care provider.  In the hospital, there were times when pain medicine seeking behavior was suspected.  Patient states to me today that she has run out of her pain medicines.  I will give her oxycodone for only 3 days.  She has an appointment with her primary care provider on Monday 3/2.  Patient also asked for Ambien for sleep.  I would note, and BZD and opioids.  I offered her Remeron for 3 days which she agreed to.  Daily marijuana use-counseled to quit. She is using it for pain and appetite   Stable to discharge to home today.  Labs at d/c Lab Results  Component Value Date   CREATININE 0.54 03/05/2018   BUN 5 (L) 03/05/2018   NA 138 03/05/2018   K 3.4 (L) 03/05/2018   CL 108 03/05/2018   CO2 22 03/05/2018   Lab Results  Component Value Date   ALT 133 (H) 03/05/2018   AST 279 (H) 03/05/2018   ALKPHOS 49 03/05/2018   BILITOT 0.7 03/05/2018    Lab Results  Component Value Date  WBC 4.1 03/05/2018   HGB 11.4 (L) 03/05/2018   HCT 37.1 03/05/2018   MCV 85.9 03/05/2018   PLT 237 03/05/2018  F actin igG 5 Glucose 95 A-1antitripsyn ab 133 ANA ab IFA neg  mitochonodral M2 ab less than 20 Negative hepatitis series  Lab Results  Component Value Date   FERRITIN 199 03/04/2018  re assuring  HIV neg Protinme 1.0 Mag 1.8 Ceruloplasmin 23.9  Dg Chest 2 View  Result Date: 03/02/2018 CLINICAL DATA:  Right upper quadrant pain EXAM: CHEST - 2 VIEW COMPARISON:  01/26/2018 FINDINGS: Heart and mediastinal contours are within normal limits. No  focal opacities or effusions. No acute bony abnormality. IMPRESSION: No active cardiopulmonary disease. Electronically Signed   By: Charlett Nose M.D.   On: 03/02/2018 22:22   Ct Abdomen Pelvis W Contrast  Result Date: 03/02/2018 CLINICAL DATA:  Bilateral flank pain EXAM: CT ABDOMEN AND PELVIS WITH CONTRAST TECHNIQUE: Multidetector CT imaging of the abdomen and pelvis was performed using the standard protocol following bolus administration of intravenous contrast. CONTRAST:  ISOVUE-300 IOPAMIDOL (ISOVUE-300) INJECTION 61% COMPARISON:  01/26/2018 FINDINGS: Lower chest: Lung bases are clear. No effusions. Heart is normal size. Hepatobiliary: Severe diffuse fatty infiltration of the liver. No visible focal abnormality. Gallbladder unremarkable. Pancreas: No focal abnormality or ductal dilatation. Spleen: No focal abnormality.  Normal size. Adrenals/Urinary Tract: No adrenal abnormality. No focal renal abnormality. No stones or hydronephrosis. Urinary bladder is unremarkable. Stomach/Bowel: Appendix is normal. Stomach, large and small bowel grossly unremarkable. Vascular/Lymphatic: Aortic atherosclerosis. No enlarged abdominal or pelvic lymph nodes. Reproductive: Uterus and adnexa unremarkable. No mass. Tampon within the vagina. Other: No free fluid or free air. Musculoskeletal: No acute bony abnormality. IMPRESSION: Severe diffuse fatty infiltration of the liver. Aortic atherosclerosis. No acute findings in the abdomen or pelvis. Electronically Signed   By: Charlett Nose M.D.   On: 03/02/2018 20:52   US Abdomen Limited Ruq  Result Date: 03/02/2018 CLINICAL DATA:  Abdominal pain EXAM: ULTRASOUND ABDOMEN LIMITED RIGHT UPPER QUADRANT COMPARISON:  CT performed today FINDINGS: Gallbladder: No gallstones or wall thickening visualized. No sonographic Murphy sign noted by sonographer. Common bile duct: Diameter: Borderline in diameter, 8 mm. Liver: Increased echotexture compatible with fatty infiltration. No focal  abnormality or biliary ductal dilatation. Portal vein is patent on color Doppler imaging with normal direction of blood flow towards the liver. IMPRESSION: Fatty infiltration of the liver. Borderline diameter of the common bile duct, 8 mm. No evidence of cholelithiasis or cholecystitis. Electronically Signed   By: Charlett Nose M.D.   On: 03/02/2018 23:09    Wt Readings from Last 3 Encounters:  03/08/18 162 lb 9 oz (73.7 kg)  03/05/18 156 lb 4.8 oz (70.9 kg)  03/01/18 164 lb 8 oz (74.6 kg)   27.05 kg/m   bp is stable today  No cp or palpitations or headaches or edema  No side effects to medicines  BP Readings from Last 3 Encounters:  03/08/18 (!) 176/84  03/05/18 (!) 162/98  03/01/18 (!) 162/84    Taking amlodipine 10 mg  Lisinopril 20 (was taking 10- she did not realize they upped it) Metoprolol xl 50 mg  Has not missed any doses  Has another f/u here on 2/25  Pulse Readings from Last 3 Encounters:  03/08/18 68  03/05/18 75  03/01/18 78   bp much better on 2nd check 142/70  Pt said GI is supposed to call to set up an appt   IBS -worse with her  menstrual cycle  Is interested in anti spasmotic med when she sees GI  She also feels uncomfortable when food goes down her esophagus   Anxiety- zoloft is helping with anxiety  Was given Remeron in the hospital -this helps sleep and wants    ? Fibromyalgia - pain all over  Was given 20 pills per month narcotic - from podiatry but that is gone  Wants to get her foot surgery so she can then schedule her hysterectomy   Mentioned MRI of back perhaps in the future   No alcohol since last week (to help her sleep) Went to [employer] meetings twice since hosp No w/d symptoms   Patient Active Problem List   Diagnosis Date Noted  . Appetite loss 03/08/2018  . Myofascial pain 03/08/2018  . IBS (irritable bowel syndrome) 03/08/2018  . Alcoholic hepatitis without ascites 03/05/2018  . Elevated LFTs 03/03/2018  . Achilles tendonitis  01/28/2018  . Abnormal cervical Papanicolaou smear 09/29/2017  . Insomnia 09/29/2017  . Pain 09/29/2017  . Palpitations 05/25/2017  . Chest wall pain 05/25/2017  . Elevated transaminase level 05/25/2017  . Depression with anxiety 02/23/2017  . 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 Hyponatremia 02/17/2017  . Hypokalemia 09/24/2016  . H/O alcohol abuse 09/22/2016  . H/O acute pancreatitis 09/22/2016  . GERD (gastroesophageal reflux disease) 04/09/2015  . 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 Chest pain 08/20/2012  . Stress reaction 07/23/2012  . Hypertension 06/09/2012  . History of pulmonary embolism 06/09/2012  . Preop cardiovascular exam 06/08/2012  . Dysmenorrhea   . Dyspareunia   . Pelvic pain    Past Medical History:  Diagnosis Date  . Anxiety   . Cardiomyopathy (HCC)   . Chronic lower back pain   . Complication of anesthesia    "hard to get under"  . Cyst of ovary   . Depression    hx  . DVT complicating pregnancy 08/1998   RLE; RUE  . Dysmenorrhea   . Dyspareunia   . 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 GERD (gastroesophageal reflux disease)   . Hay fever    "fall and spring" (02/17/2017)  . Headache    "2-3 times/.wik" (02/17/2017)  . History of chicken pox   . Hypertension   . Migraine    "monthly" (02/17/2017)  . Pelvic pain   . Pulmonary embolism (HCC) 08/1998   S/P childbirth   Past Surgical History:  Procedure Laterality Date  . CESAREAN SECTION  1994  . ECTOPIC PREGNANCY SURGERY  2010  . LAPAROSCOPY N/A 09/24/2012   Procedure: OPERATIVE LAPROSCOPY WITH LYSIS OF ADHESIONS;  Surgeon: Sherron Monday, MD;  Location: [facility];  Service: Gynecology;  Laterality: N/A;  . TONSILLECTOMY  1984  . TUBAL LIGATION  2010   Social History   Tobacco Use  . Smoking status: Never Smoker  . Smokeless tobacco: Never Used  Substance Use Topics  . Alcohol use: Not Currently    Alcohol/week: 5.0 standard drinks    Types: 5 Cans of beer per week     Frequency: Never    Comment: has quit (March 2019)  . Drug use: No   Family History  Problem Relation Age of Onset  . Cancer - Lung Mother   . Heart failure Father   . Hypertension Father    No Known Allergies Current Outpatient Medications on File Prior to Visit  Medication Sig Dispense Refill  . HYDROcodone-acetaminophen (NORCO) 5-325 MG tablet Take 1 tablet by mouth every 6 (six) hours  as needed for up to 3 days for moderate pain. 12 tablet 0  . hydrOXYzine (ATARAX/VISTARIL) 25 MG tablet Take 1 tablet (25 mg total) by mouth every 8 (eight) hours as needed for anxiety or nausea. 90 tablet 1  . lisinopril (PRINIVIL,ZESTRIL) 10 MG tablet Take 2 tablets (20 mg total) by mouth daily for 30 days. 60 tablet 0  . metoprolol succinate (TOPROL-XL) 50 MG 24 hr tablet Take 1 tablet (50 mg total) by mouth daily. Take with or immediately following a meal. 90 tablet 3  . Multiple Vitamin (MULTIVITAMIN WITH MINERALS) TABS tablet Take 1 tablet by mouth daily. 30 tablet 0  . omeprazole (PRILOSEC) 20 MG capsule Take 1 capsule (20 mg total) by mouth daily. 30 capsule 11  . sertraline (ZOLOFT) 50 MG tablet Take 1 tablet (50 mg total) by mouth daily. 30 tablet 11   No current facility-administered medications on file prior to visit.     Review of Systems  Constitutional: Positive for appetite change and fatigue. Negative for activity change, chills, diaphoresis, fever and unexpected weight change.  HENT: Negative for congestion, ear pain, rhinorrhea, sinus pressure and sore throat.   Eyes: Negative for pain, redness and visual disturbance.  Respiratory: Negative for cough, shortness of breath and wheezing.   Cardiovascular: Negative for chest pain and palpitations.  Gastrointestinal: Positive for constipation, diarrhea and nausea. Negative for abdominal distention, abdominal pain, blood in stool and vomiting.       Heartburn and uncomfortable swallowing   Endocrine: Negative for polydipsia and polyuria.    Genitourinary: Negative for dysuria, frequency and urgency.  Musculoskeletal: Positive for arthralgias, back pain and myalgias.  Skin: Negative for pallor and rash.  Allergic/Immunologic: Negative for environmental allergies.  Neurological: Negative for dizziness, tremors, seizures, syncope, facial asymmetry, speech difficulty, weakness, light-headedness, numbness and headaches.  Hematological: Negative for adenopathy. Does not bruise/bleed easily.  Psychiatric/Behavioral: Positive for sleep disturbance. Negative for decreased concentration and dysphoric mood. The patient is nervous/anxious.        Objective:   Physical Exam Constitutional:      General: She is not in acute distress.    Appearance: Normal appearance. She is well-developed and normal weight. She is not ill-appearing or diaphoretic.  HENT:     Head: Normocephalic and atraumatic.     Nose: Nose normal.     Mouth/Throat:     Mouth: Mucous membranes are moist.     Pharynx: Oropharynx is clear.  Eyes:     General: No scleral icterus.    Conjunctiva/sclera: Conjunctivae normal.     Pupils: Pupils are equal, round, and reactive to light.  Neck:     Musculoskeletal: Normal range of motion and neck supple.     Thyroid: No thyromegaly.     Vascular: No carotid bruit or JVD.  Cardiovascular:     Rate and Rhythm: Normal rate and regular rhythm.     Pulses: Normal pulses.     Heart sounds: Normal heart sounds. No gallop.   Pulmonary:     Effort: Pulmonary effort is normal. No respiratory distress.     Breath sounds: Normal breath sounds. No wheezing or rales.     Comments: Good air exch Abdominal:     General: Bowel sounds are normal. There is no distension or abdominal bruit.     Palpations: Abdomen is soft. There is no mass.     Tenderness: There is abdominal tenderness. There is no right CVA tenderness, left CVA tenderness, guarding or rebound.  Comments: Mild epigastric and RUQ tenderness w/o rebound or guarding    Musculoskeletal:        General: No deformity.     Right lower leg: No edema.     Left lower leg: No edema.     Comments: Tender over muscles/joints and soft tissue diffusely  Lymphadenopathy:     Cervical: No cervical adenopathy.  Skin:    General: Skin is warm and dry.     Capillary Refill: Capillary refill takes less than 2 seconds.     Coloration: Skin is not jaundiced or pale.     Findings: No rash.  Neurological:     Mental Status: She is alert. Mental status is at baseline.     Cranial Nerves: No cranial nerve deficit.     Coordination: Coordination normal.     Gait: Gait normal.     Deep Tendon Reflexes: Reflexes are normal and symmetric. Reflexes normal.     Comments: No tremor   Psychiatric:        Mood and Affect: Mood is anxious.        Speech: Speech normal.        Behavior: Behavior normal.     Comments: Anxious but mood is improved overall Talkative            Assessment & Plan:   Problem List Items Addressed This Visit      Cardiovascular and Mediastinum   Hypertension    Improved on 2nd check BP: (!) 142/70    She is supposed to take 20 mg of lisinopril and did not raise her dose  She will do this and f/u later this mo as planned  Enc good diet/lifestyle habits and etoh avoidance      Relevant Medications   amLODipine (NORVASC) 10 MG tablet     Digestive   GERD (gastroesophageal reflux disease)   Alcoholic hepatitis without ascites - Primary    Recent hospitalization  Reviewed hospital records, lab results and studies in detail   clinicall improvement  No etoh use and going to AA meetings regularly  For GI f/u soon (expecting a call) -she saw Dr Marca Ancona in the hospital  abd pain is resolved Disc imp of complete etoh abstinence and pt is aware /agrees  Liver labs were reassuring for other causes of hepatitis       IBS (irritable bowel syndrome)    Intermittent constipation Barron Alvine For GI f/u soon        Other   H/O alcohol abuse    Depression with anxiety    zoloft is helping so far  remeron added in hospital- helping sleep (expect it will also help appetite and myofacial chronic pain) F/u end of the mo      Relevant Medications   mirtazapine (REMERON) 15 MG tablet   Insomnia    remeron has helped Refilled this       Elevated LFTs    Recent hosp for etoh hepatitis Ast/alt elevated-other labs re assuring No jaundice  For GI f/u soon      Appetite loss    Suspect due to multiple physical problems and mood issues  remeron px in hospital should help  F/u planned  Wt is stable       Myofascial pain    Disc poss of myofacisal pain syndrome in light of widespread body pain / also hx of headaches and IBS Overall sensitive to pain (worse with stress and etoh use)) Poor sleep  Given remeron 15  mg in hospital that helps-this was refilled  Also continue sertraline 50 mg-tolerates well  F/u later this mo Continues AA meetings as well  Disc tx for myofacial pain syndrome includes low impact exercise and good self care  Enc her to get off pain medication

## 2018-03-08 NOTE — Assessment & Plan Note (Signed)
Recent hosp for etoh hepatitis Ast/alt elevated-other labs re assuring No jaundice  For GI f/u soon

## 2018-03-08 NOTE — Assessment & Plan Note (Signed)
zoloft is helping so far  remeron added in hospital- helping sleep (expect it will also help appetite and myofacial chronic pain) F/u end of the mo

## 2018-03-08 NOTE — Patient Instructions (Addendum)
Avoid pain medication  Follow up with GI as planned for IBS and the liver issues   Continue the zoloft for anxiety  Continue remeron for mood/sleep/appetite and chronic pain   Continue your blood pressure medicines as ordered  Increase the lisinopril to 20   Continue AA and abstinence from alcohol

## 2018-03-09 DIAGNOSIS — M25671 Stiffness of right ankle, not elsewhere classified: Secondary | ICD-10-CM | POA: Diagnosis not present

## 2018-03-09 DIAGNOSIS — M25571 Pain in right ankle and joints of right foot: Secondary | ICD-10-CM | POA: Diagnosis not present

## 2018-03-09 DIAGNOSIS — R269 Unspecified abnormalities of gait and mobility: Secondary | ICD-10-CM | POA: Diagnosis not present

## 2018-03-17 ENCOUNTER — Telehealth: Payer: Self-pay

## 2018-03-17 ENCOUNTER — Encounter: Payer: Self-pay | Admitting: Family Medicine

## 2018-03-17 ENCOUNTER — Ambulatory Visit: Payer: 59 | Admitting: Family Medicine

## 2018-03-17 ENCOUNTER — Other Ambulatory Visit: Payer: Self-pay

## 2018-03-17 VITALS — BP 120/72 | HR 72 | Resp 16 | Ht 65.0 in | Wt 175.5 lb

## 2018-03-17 DIAGNOSIS — K7010 Alcoholic hepatitis without ascites: Secondary | ICD-10-CM | POA: Diagnosis not present

## 2018-03-17 DIAGNOSIS — R0602 Shortness of breath: Secondary | ICD-10-CM

## 2018-03-17 DIAGNOSIS — M7989 Other specified soft tissue disorders: Secondary | ICD-10-CM | POA: Diagnosis not present

## 2018-03-17 MED ORDER — HYDROCHLOROTHIAZIDE 25 MG PO TABS: 25.0000 mg | ORAL_TABLET | Freq: Every day | ORAL | 0 refills | Status: DC | PRN

## 2018-03-17 NOTE — Telephone Encounter (Signed)
Noted  

## 2018-03-17 NOTE — Patient Instructions (Signed)
Good to see you today  We will notify you tomorrow of lab results  I have sent in a diuretic to your pharmacy  If anything changes please call the office- fever/chills, increased swelling or shortness of breath

## 2018-03-17 NOTE — Progress Notes (Signed)
Subjective:    Patient ID: Nicole Hughes, female    DOB: 07/22/1970, 48 y.o.   MRN: [id number]  HPI This is a 48 yo female who presents today with bilateral leg swelling x 4 days. Swelling goes down at night, but she notices that legs are swollen shortly after getting out of bed. Feels swollen up to calves. Shoes are tight. Feels a little SOB today, thought it was her allergies, no cough, some scratchy throat, no runny nose or watery eyes.  No chest pain, no palpitations. Feels bloated in abdomen.  Was recently in hospital with alcoholic hepatitis without ascites. Has had PCP follow up but no GI follow up. Per patient, she was never called. Saw Dr. Marca Ancona, [facility] while inpatient.  She denies drinking alcohol.  Past history of DVT and cardiomyopathy with pregnancy, per patient, heart function returned to normal, she was on medication then discharged and she was released from follow up.  Has some weight gain but also was recently started on mirtazapine and sertraline.   Past Medical History:  Diagnosis Date  . Anxiety   . Cardiomyopathy (HCC)   . Chronic lower back pain   . Complication of anesthesia    "hard to get under"  . Cyst of ovary   . Depression    hx  . DVT complicating pregnancy 08/1998   RLE; RUE  . Dysmenorrhea   . Dyspareunia   . 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 GERD (gastroesophageal reflux disease)   . Hay fever    "fall and spring" (02/17/2017)  . Headache    "2-3 times/.wik" (02/17/2017)  . History of chicken pox   . Hypertension   . Migraine    "monthly" (02/17/2017)  . Pelvic pain   . Pulmonary embolism (HCC) 08/1998   S/P childbirth   Past Surgical History:  Procedure Laterality Date  . CESAREAN SECTION  1994  . ECTOPIC PREGNANCY SURGERY  2010  . LAPAROSCOPY N/A 09/24/2012   Procedure: OPERATIVE LAPROSCOPY WITH LYSIS OF ADHESIONS;  Surgeon: Sherron Monday, MD;  Location: [facility];  Service: Gynecology;  Laterality: N/A;  . TONSILLECTOMY   1984  . TUBAL LIGATION  2010   Family History  Problem Relation Age of Onset  . Cancer - Lung Mother   . Heart failure Father   . Hypertension Father    Social History   Tobacco Use  . Smoking status: Never Smoker  . Smokeless tobacco: Never Used  Substance Use Topics  . Alcohol use: Not Currently    Alcohol/week: 5.0 standard drinks    Types: 5 Cans of beer per week    Frequency: Never    Comment: has quit (March 2019)  . Drug use: No      Review of Systems Per HPI    Objective:   Physical Exam Vitals signs reviewed.  Constitutional:      General: She is not in acute distress.    Appearance: Normal appearance. She is normal weight. She is not ill-appearing, toxic-appearing or diaphoretic.  HENT:     Head: Normocephalic and atraumatic.  Cardiovascular:     Rate and Rhythm: Normal rate and regular rhythm.     Heart sounds: Normal heart sounds.  Pulmonary:     Effort: Pulmonary effort is normal.     Breath sounds: Normal breath sounds.  Musculoskeletal:     Right lower leg: Edema (trace pretibial) present.     Left lower leg: Edema (trace pretibial) present.  Skin:  General: Skin is warm and dry.  Neurological:     Mental Status: She is alert and oriented to person, place, and time.  Psychiatric:        Mood and Affect: Mood normal.        Behavior: Behavior normal.        Thought Content: Thought content normal.        Judgment: Judgment normal.       BP 120/72 (BP Location: Right Arm, Patient Position: Sitting)   Pulse 72   Resp 16   Ht 5\' 5"  (1.651 m)   Wt 175 lb 8 oz (79.6 kg)   LMP 02/27/2018   SpO2 95%   BMI 29.20 kg/m  Wt Readings from Last 3 Encounters:  03/17/18 175 lb 8 oz (79.6 kg)  03/08/18 162 lb 9 oz (73.7 kg)  03/05/18 156 lb 4.8 oz (70.9 kg)       Assessment & Plan:  Discussed with Dr. Milinda Antis 1. Leg swelling - unclear etiology, history recent alcoholic hepatitis without ascites - will check labs, give her diuretic - she  has follow up on file for 03/31/18 with Dr. Milinda Antis - she was instructed to follow up if any worsening symptoms - hydrochlorothiazide (HYDRODIURIL) 25 MG tablet; Take 1 tablet (25 mg total) by mouth daily as needed.  Dispense: 30 tablet; Refill: 0 - Comprehensive metabolic panel - CBC with Differential - Brain natriuretic peptide  2. SOB (shortness of breath) - Comprehensive metabolic panel - CBC with Differential - Brain natriuretic peptide  3. Alcoholic hepatitis without ascites - patient requests referral to [facility], referral placed - Ambulatory referral to Gastroenterology   Olean Ree, FNP-BC   Primary Care at [facility], [facility]  03/17/2018 5:25 PM

## 2018-03-17 NOTE — Telephone Encounter (Addendum)
Pt left v/m that both feet are swelling; pt last seen [facility] on 03/08/18. Since 03/15/18 pt has had huge swelling both feet and lower legs; this is new symptoms. Pt wanted to know if needed appt to be seen. I was unable to reach pt by phone and left v/m for pt to return call to get further symptoms such as SOB, leg redness, warmth or pain. Pt needs to schedule appt if significant swelling as new symptom.FYI to [place] CMA. Pt called back and there is redness, swelling and warmth with itching to both lower extremities. No CP or SOB. Overnight leg swelling is better but within 20 mins of being up in the morning the swelling returns. No red streaking. Dr Milinda Antis does not have available appt; pt scheduled 30' appt with Harlin Heys FNP 03/17/18 at 3 PM.

## 2018-03-18 LAB — COMPREHENSIVE METABOLIC PANEL
ALT: 218 U/L — ABNORMAL HIGH (ref 0–35)
AST: 290 U/L — ABNORMAL HIGH (ref 0–37)
Albumin: 4.3 g/dL (ref 3.5–5.2)
Alkaline Phosphatase: 51 U/L (ref 39–117)
BUN: 9 mg/dL (ref 6–23)
CO2: 24 mEq/L (ref 19–32)
Calcium: 8.8 mg/dL (ref 8.4–10.5)
Chloride: 95 mEq/L — ABNORMAL LOW (ref 96–112)
Creatinine, Ser: 0.62 mg/dL (ref 0.40–1.20)
GFR: 103.65 mL/min (ref >60.00)
Glucose, Bld: 81 mg/dL (ref 70–99)
Potassium: 3.7 mEq/L (ref 3.5–5.1)
Sodium: 129 mEq/L — ABNORMAL LOW (ref 135–145)
Total Bilirubin: 0.4 mg/dL (ref 0.2–1.2)
Total Protein: 6.8 g/dL (ref 6.0–8.3)

## 2018-03-18 LAB — CBC WITH DIFFERENTIAL/PLATELET
Basophils Absolute: 0.1 10*3/uL (ref 0.0–0.1)
Basophils Relative: 1.2 % (ref 0.0–3.0)
Eosinophils Absolute: 0.5 10*3/uL (ref 0.0–0.7)
Eosinophils Relative: 8.9 % — ABNORMAL HIGH (ref 0.0–5.0)
HCT: 31 % — ABNORMAL LOW (ref 36.0–46.0)
Hemoglobin: 10.1 g/dL — ABNORMAL LOW (ref 12.0–15.0)
Lymphocytes Relative: 29.8 % (ref 12.0–46.0)
Lymphs Abs: 1.7 10*3/uL (ref 0.7–4.0)
MCHC: 32.7 g/dL (ref 30.0–36.0)
MCV: 80.7 fl (ref 78.0–100.0)
Monocytes Absolute: 0.6 10*3/uL (ref 0.1–1.0)
Monocytes Relative: 10.7 % (ref 3.0–12.0)
Neutro Abs: 2.8 10*3/uL (ref 1.4–7.7)
Neutrophils Relative %: 49.4 % (ref 43.0–77.0)
Platelets: 197 10*3/uL (ref 150.0–400.0)
RBC: 3.83 Mil/uL — ABNORMAL LOW (ref 3.87–5.11)
RDW: 16.8 % — ABNORMAL HIGH (ref 11.5–15.5)
WBC: 5.6 10*3/uL (ref 4.0–10.5)

## 2018-03-18 LAB — BRAIN NATRIURETIC PEPTIDE: Pro B Natriuretic peptide (BNP): 113 pg/mL — ABNORMAL HIGH (ref 0.0–100.0)

## 2018-03-23 ENCOUNTER — Ambulatory Visit: Payer: 59 | Admitting: Sports Medicine

## 2018-03-23 ENCOUNTER — Other Ambulatory Visit: Payer: Self-pay | Admitting: Family Medicine

## 2018-03-23 ENCOUNTER — Telehealth: Payer: Self-pay | Admitting: Family Medicine

## 2018-03-23 ENCOUNTER — Other Ambulatory Visit: Payer: Self-pay

## 2018-03-23 ENCOUNTER — Encounter: Payer: Self-pay | Admitting: Sports Medicine

## 2018-03-23 DIAGNOSIS — M79673 Pain in unspecified foot: Secondary | ICD-10-CM | POA: Diagnosis not present

## 2018-03-23 DIAGNOSIS — M216X1 Other acquired deformities of right foot: Secondary | ICD-10-CM | POA: Diagnosis not present

## 2018-03-23 DIAGNOSIS — M7661 Achilles tendinitis, right leg: Secondary | ICD-10-CM | POA: Diagnosis not present

## 2018-03-23 DIAGNOSIS — E871 Hypo-osmolality and hyponatremia: Secondary | ICD-10-CM

## 2018-03-23 DIAGNOSIS — M7751 Other enthesopathy of right foot: Secondary | ICD-10-CM | POA: Diagnosis not present

## 2018-03-23 DIAGNOSIS — D649 Anemia, unspecified: Secondary | ICD-10-CM

## 2018-03-23 DIAGNOSIS — K7010 Alcoholic hepatitis without ascites: Secondary | ICD-10-CM

## 2018-03-23 DIAGNOSIS — M9261 Juvenile osteochondrosis of tarsus, right ankle: Secondary | ICD-10-CM

## 2018-03-23 MED ORDER — HYDROCODONE-ACETAMINOPHEN 5-325 MG PO TABS: 1.0000 | ORAL_TABLET | Freq: Four times a day (QID) | ORAL | 0 refills | Status: AC | PRN

## 2018-03-23 NOTE — Progress Notes (Signed)
Subjective: Nicole Hughes is a 48 y.o. female patient who returns to office for evaluation of Right>Left heel pain. Patient complains of continued pain especially at the Achilles that is throbbing in nature on left 0/10 feels 100% better whereas the right is worse still significantly painful 7 out of 10 like before however reports that her cam boot offer some relief and her pain medicine offer some relief. Last injection did not help. Patient states that she wants to consider surgery.   Patient Active Problem List   Diagnosis Date Noted  . Appetite loss 03/08/2018  . Myofascial pain 03/08/2018  . IBS (irritable bowel syndrome) 03/08/2018  . Alcoholic hepatitis without ascites 03/05/2018  . Elevated LFTs 03/03/2018  . Achilles tendonitis 01/28/2018  . Abnormal cervical Papanicolaou smear 09/29/2017  . Insomnia 09/29/2017  . Pain 09/29/2017  . Palpitations 05/25/2017  . Chest wall pain 05/25/2017  . Elevated transaminase level 05/25/2017  . Depression with anxiety 02/23/2017  . 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 Hyponatremia 02/17/2017  . Hypokalemia 09/24/2016  . H/O alcohol abuse 09/22/2016  . H/O acute pancreatitis 09/22/2016  . GERD (gastroesophageal reflux disease) 04/09/2015  . 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 Chest pain 08/20/2012  . Stress reaction 07/23/2012  . Hypertension 06/09/2012  . History of pulmonary embolism 06/09/2012  . Preop cardiovascular exam 06/08/2012  . Dysmenorrhea   . Dyspareunia   . Pelvic pain     Current Outpatient Medications on File Prior to Visit  Medication Sig Dispense Refill  . amLODipine (NORVASC) 10 MG tablet Take 1 tablet (10 mg total) by mouth daily. 30 tablet 11  . hydrochlorothiazide (HYDRODIURIL) 25 MG tablet Take 1 tablet (25 mg total) by mouth daily as needed. 30 tablet 0  . hydrOXYzine (ATARAX/VISTARIL) 25 MG tablet Take 1 tablet (25 mg total) by mouth every 8 (eight) hours as needed for anxiety or nausea. 90 tablet 1   . lisinopril (PRINIVIL,ZESTRIL) 10 MG tablet Take 2 tablets (20 mg total) by mouth daily for 30 days. 60 tablet 0  . metoprolol succinate (TOPROL-XL) 50 MG 24 hr tablet Take 1 tablet (50 mg total) by mouth daily. Take with or immediately following a meal. 90 tablet 3  . mirtazapine (REMERON) 15 MG tablet Take 1 tablet (15 mg total) by mouth at bedtime. 30 tablet 5  . Multiple Vitamin (MULTIVITAMIN WITH MINERALS) TABS tablet Take 1 tablet by mouth daily. 30 tablet 0  . omeprazole (PRILOSEC) 20 MG capsule Take 1 capsule (20 mg total) by mouth daily. 30 capsule 11  . sertraline (ZOLOFT) 50 MG tablet Take 1 tablet (50 mg total) by mouth daily. 30 tablet 11   No current facility-administered medications on file prior to visit.     No Known Allergies   Family History  Problem Relation Age of Onset  . Cancer - Lung Mother   . Heart failure Father   . Hypertension Father    Social History   Socioeconomic History  . Marital status: Married    Spouse name: Not on file  . Number of children: Not on file  . Years of education: Not on file  . Highest education level: Not on file  Occupational History  . Not on file  Social Needs  . Financial resource strain: Not on file  . Food insecurity:    Worry: Not on file    Inability: Not on file  . Transportation needs:    Medical: Not on file    Non-medical: Not  on file  Tobacco Use  . Smoking status: Never Smoker  . Smokeless tobacco: Never Used  Substance and Sexual Activity  . Alcohol use: Not Currently    Alcohol/week: 5.0 standard drinks    Types: 5 Cans of beer per week    Frequency: Never    Comment: has quit (March 2019)  . Drug use: No  . Sexual activity: Not Currently    Birth control/protection: Surgical  Lifestyle  . Physical activity:    Days per week: Not on file    Minutes per session: Not on file  . Stress: Not on file  Relationships  . Social connections:    Talks on phone: Not on file    Gets together: Not on  file    Attends religious service: Not on file    Active member of club or organization: Not on file    Attends meetings of clubs or organizations: Not on file    Relationship status: Not on file  Other Topics Concern  . Not on file  Social History Narrative  . Not on file    Past Surgical History:  Procedure Laterality Date  . CESAREAN SECTION  1994  . ECTOPIC PREGNANCY SURGERY  2010  . LAPAROSCOPY N/A 09/24/2012   Procedure: OPERATIVE LAPROSCOPY WITH LYSIS OF ADHESIONS;  Surgeon: Sherron Monday, MD;  Location: [facility];  Service: Gynecology;  Laterality: N/A;  . TONSILLECTOMY  1984  . TUBAL LIGATION  2010     Objective:  General: Alert and oriented x3 in no acute distress  Dermatology: No open lesions bilateral lower extremities, no webspace macerations, no ecchymosis bilateral, all nails x 10 are well manicured.  Vascular: Dorsalis Pedis and Posterior Tibial pedal pulses 2/4, Capillary Fill Time 3 seconds, + pedal hair growth bilateral, no edema bilateral lower extremities, Temperature gradient within normal limits.  Neurology: Michaell Cowing sensation intact via light touch bilateral.  Musculoskeletal: Moderate tenderness with palpation at insertion of the Achilles on Right> and no tenderness on Left, there is calcaneal exostosis with moderate soft tissue swelling present and decreased ankle rom with knee extending  vs flexed resembling gastroc equnius bilateral, The achilles tendon feels intact with no nodularity or palpable dell, Thompson sign negative. Subjective pain sometimes to ankle on right like before with difficulty walking or standing, boot helps like before.   Gait: Antalgic gait   Assessment and Plan: Problem List Items Addressed This Visit    None    Visit Diagnoses    Tendonitis, Achilles, right    -  Primary   Acquired equinus deformity of right foot       Inflammatory heel pain, unspecified laterality       Relevant Medications   HYDROcodone-acetaminophen (NORCO)  5-325 MG tablet   Right calcaneal bursitis       Haglund's deformity of right heel         -Complete examination performed -Re-Discussed treatement options for bilateral tendonitis at achilles with the right side still being symptomatic -Patient opt for surgical management. Consent obtained for Right achilles tendon repair with suture anchor and tendon wrap/matrix with posterior splint. Pre and Post op course explained. Risks, benefits, alternatives explained. No guarantees given or implied. Surgical booking slip submitted and provided patient with Surgical packet and info for [facility] -Patient to call scheduler for date  -Refilled Norco -Continue with PT until time for surgery  -Continue with Right CAM boot until surgery  -Patient to return to office after surgery or sooner if condition  worsens.  Asencion Islam, DPM

## 2018-03-23 NOTE — Telephone Encounter (Signed)
Please call patient and schedule her a lab only appointment for today or tomorrow. See if her swelling is better.

## 2018-03-23 NOTE — Patient Instructions (Signed)
Pre-Operative Instructions  Congratulations, you have decided to take an important step towards improving your quality of life.  You can be assured that the doctors and staff at [facility] will be with you every step of the way.  Here are some important things you should know:  1. Plan to be at the surgery center/hospital at least 1 (one) hour prior to your scheduled time, unless otherwise directed by the surgical center/hospital staff.  You must have a responsible adult accompany you, remain during the surgery and drive you home.  Make sure you have directions to the surgical center/hospital to ensure you arrive on time. 2. If you are having surgery at [facility] or [facility], you will need a copy of your medical history and physical form from your family physician within one month prior to the date of surgery. We will give you a form for your primary physician to complete.  3. We make every effort to accommodate the date you request for surgery.  However, there are times where surgery dates or times have to be moved.  We will contact you as soon as possible if a change in schedule is required.   4. No aspirin/ibuprofen for one week before surgery.  If you are on aspirin, any non-steroidal anti-inflammatory medications (Mobic, Aleve, Ibuprofen) should not be taken seven (7) days prior to your surgery.  You make take Tylenol for pain prior to surgery.  5. Medications - If you are taking daily heart and blood pressure medications, seizure, reflux, allergy, asthma, anxiety, pain or diabetes medications, make sure you notify the surgery center/hospital before the day of surgery so they can tell you which medications you should take or avoid the day of surgery. 6. No food or drink after midnight the night before surgery unless directed otherwise by surgical center/hospital staff. 7. No alcoholic beverages 24-hours prior to surgery.  No smoking 24-hours prior or 24-hours after  surgery. 8. Wear loose pants or shorts. They should be loose enough to fit over bandages, boots, and casts. 9. Don't wear slip-on shoes. Sneakers are preferred. 10. Bring your boot with you to the surgery center/hospital.  Also bring crutches or a walker if your physician has prescribed it for you.  If you do not have this equipment, it will be provided for you after surgery. 11. If you have not been contacted by the surgery center/hospital by the day before your surgery, call to confirm the date and time of your surgery. 12. Leave-time from work may vary depending on the type of surgery you have.  Appropriate arrangements should be made prior to surgery with your employer. 13. Prescriptions will be provided immediately following surgery by your doctor.  Fill these as soon as possible after surgery and take the medication as directed. Pain medications will not be refilled on weekends and must be approved by the doctor. 14. Remove nail polish on the operative foot and avoid getting pedicures prior to surgery. 15. Wash the night before surgery.  The night before surgery wash the foot and leg well with water and the antibacterial soap provided. Be sure to pay special attention to beneath the toenails and in between the toes.  Wash for at least three (3) minutes. Rinse thoroughly with water and dry well with a towel.  Perform this wash unless told not to do so by your physician.  Enclosed: 1 Ice pack (please put in freezer the night before surgery)   1 Hibiclens skin cleaner     Pre-op instructions  If you have any questions regarding the instructions, please do not hesitate to call our office.  [place]: [address], , [place] [address] -- [phone]  [place]: [address] [place], [place] [address] -- [phone]  [place]: [address]  [place], [place] [address] -- [phone]  [place]: [address], [address], [place], [place] [address] -- [phone]  Website:  [email or web address] 

## 2018-03-23 NOTE — Telephone Encounter (Signed)
Pt called no answer voice mail left for pt to call back 

## 2018-03-25 ENCOUNTER — Telehealth: Payer: Self-pay

## 2018-03-25 NOTE — Telephone Encounter (Signed)
Left VM letting pt know Dr. Tower's comments  

## 2018-03-25 NOTE — Telephone Encounter (Signed)
Pt called stated that her swelling is better, she also stated that she has an appointment next week with Dr. Milinda Antis and wants to know if she can get her labs done then. Pt would also like to know if she can have her scripts changed from a month supply to 3 months so she does not have to go out as much.

## 2018-03-25 NOTE — Telephone Encounter (Signed)
We can do her labs and px at that visit

## 2018-03-26 ENCOUNTER — Telehealth: Payer: Self-pay

## 2018-03-26 NOTE — Telephone Encounter (Signed)
Pt left v/m having horrible stomach cramps,terrible cramping in back, legs,arms. When pt bends over pt feels a knot where cramp is even cramping in throat; pt can breathe OK; if moves hand pt thumb will cramp; has been going on for 3 days. Pain level is 6. Recently adjusted fluid pilll. Pt taking K. Pt said the pain is not that bad now and pt does not want to go to UC or ED. Pt already has appt with Dr Milinda Antis next wk and if needed pt will cb overweekend and speak with on call provider. FYI to Dr Milinda Antis and Dr Ermalene Searing. ED precautions given.

## 2018-03-26 NOTE — Telephone Encounter (Signed)
Noted  

## 2018-03-31 ENCOUNTER — Other Ambulatory Visit: Payer: Self-pay

## 2018-03-31 ENCOUNTER — Telehealth (INDEPENDENT_AMBULATORY_CARE_PROVIDER_SITE_OTHER): Payer: 59 | Admitting: Family Medicine

## 2018-03-31 DIAGNOSIS — R1012 Left upper quadrant pain: Secondary | ICD-10-CM

## 2018-03-31 DIAGNOSIS — E871 Hypo-osmolality and hyponatremia: Secondary | ICD-10-CM

## 2018-03-31 DIAGNOSIS — R740 Nonspecific elevation of levels of transaminase and lactic acid dehydrogenase [LDH]: Secondary | ICD-10-CM

## 2018-03-31 DIAGNOSIS — D649 Anemia, unspecified: Secondary | ICD-10-CM

## 2018-03-31 DIAGNOSIS — F1011 Alcohol abuse, in remission: Secondary | ICD-10-CM

## 2018-03-31 DIAGNOSIS — K7010 Alcoholic hepatitis without ascites: Secondary | ICD-10-CM

## 2018-03-31 DIAGNOSIS — R600 Localized edema: Secondary | ICD-10-CM | POA: Diagnosis not present

## 2018-03-31 DIAGNOSIS — I10 Essential (primary) hypertension: Secondary | ICD-10-CM

## 2018-03-31 DIAGNOSIS — R7401 Elevation of levels of liver transaminase levels: Secondary | ICD-10-CM

## 2018-03-31 MED ORDER — SERTRALINE HCL 50 MG PO TABS: 50.0000 mg | ORAL_TABLET | Freq: Every day | ORAL | 3 refills | Status: DC

## 2018-03-31 MED ORDER — MIRTAZAPINE 15 MG PO TABS: 15.0000 mg | ORAL_TABLET | Freq: Every day | ORAL | 3 refills | Status: DC

## 2018-03-31 MED ORDER — METOPROLOL SUCCINATE ER 50 MG PO TB24: 50.0000 mg | ORAL_TABLET | Freq: Every day | ORAL | 3 refills | Status: DC

## 2018-03-31 MED ORDER — LISINOPRIL 10 MG PO TABS: 20.0000 mg | ORAL_TABLET | Freq: Every day | ORAL | 3 refills | Status: DC

## 2018-03-31 MED ORDER — AMLODIPINE BESYLATE 10 MG PO TABS: 10.0000 mg | ORAL_TABLET | Freq: Every day | ORAL | 3 refills | Status: DC

## 2018-03-31 MED ORDER — OMEPRAZOLE 20 MG PO CPDR: 20.0000 mg | DELAYED_RELEASE_CAPSULE | Freq: Every day | ORAL | 3 refills | Status: DC

## 2018-03-31 NOTE — Assessment & Plan Note (Signed)
With recent addn of hctz for edema  No dizziness Will try to get a bp cuff for home

## 2018-03-31 NOTE — Assessment & Plan Note (Signed)
Currently trying to get in with GI No etoh since before last visit with me  Is stuck in the house/cannot go to AA Needs re check of LFTs Will try to put together a tent/drive in blood draw asap

## 2018-03-31 NOTE — Assessment & Plan Note (Signed)
Due to chronic dz or menses or both  Re check with next labs

## 2018-03-31 NOTE — Assessment & Plan Note (Signed)
With etoh hepatitis  Per pt no etoh since before march  Due for labs Cannot go to AA due to staying in with covid crisis

## 2018-03-31 NOTE — Assessment & Plan Note (Signed)
Due to alcoholic hepatitis  No etoh since before march 1 per pt  Will re check as soon as we can get her on the lab schedule safely

## 2018-03-31 NOTE — Assessment & Plan Note (Signed)
In setting of alcoholic hepatitis (no ascites) Clinical imp with hctz but na was low and K may be low (cramping) Will try to set up urgent blood draw (tent) asap  ? If we could safely use spironolactone instead (is also on ace) - will have to decide when labs return  Adv elevation of feet  Avoidance of etoh

## 2018-03-31 NOTE — Assessment & Plan Note (Signed)
Noted with hctz May eventually need to check this (also K since she has cramping)  Will try to set up tent blood draw as soon as possible

## 2018-03-31 NOTE — Progress Notes (Signed)
Virtual Visit via Telephone Note  I connected with Nicole Hughes on 03/31/18 at  3:30 PM EDT by telephone and verified that I am speaking with the correct person using two identifiers.   I discussed the limitations, risks, security and privacy concerns of performing an evaluation and management service by telephone and the availability of in person appointments. I also discussed with the patient that there may be a patient responsible charge related to this service. The patient expressed understanding and agreed to proceed.   History of Present Illness: Pt saw NP Gessner on 3/11 for leg swelling and mild sob  Had recent hosp for etoh hepatitis w/o ascites  Labs were done  hctz 25 mg started for edema   Labs at that time Results for orders placed or performed in visit on 03/17/18  Comprehensive metabolic panel  Result Value Ref Range   Sodium 129 (L) 135 - 145 mEq/L   Potassium 3.7 3.5 - 5.1 mEq/L   Chloride 95 (L) 96 - 112 mEq/L   CO2 24 19 - 32 mEq/L   Glucose, Bld 81 70 - 99 mg/dL   BUN 9 6 - 23 mg/dL   Creatinine, Ser 4.09 0.40 - 1.20 mg/dL   Total Bilirubin 0.4 0.2 - 1.2 mg/dL   Alkaline Phosphatase 51 39 - 117 U/L   AST 290 (H) 0 - 37 U/L   ALT 218 (H) 0 - 35 U/L   Total Protein 6.8 6.0 - 8.3 g/dL   Albumin 4.3 3.5 - 5.2 g/dL   Calcium 8.8 8.4 - 81.1 mg/dL   GFR 914.78 >29.56 mL/min  CBC with Differential  Result Value Ref Range   WBC 5.6 4.0 - 10.5 K/uL   RBC 3.83 (L) 3.87 - 5.11 Mil/uL   Hemoglobin 10.1 (L) 12.0 - 15.0 g/dL   HCT 21.3 (L) 08.6 - 57.8 %   MCV 80.7 78.0 - 100.0 fl   MCHC 32.7 30.0 - 36.0 g/dL   RDW 46.9 (H) 62.9 - 52.8 %   Platelets 197.0 150.0 - 400.0 K/uL   Neutrophils Relative % 49.4 43.0 - 77.0 %   Lymphocytes Relative 29.8 12.0 - 46.0 %   Monocytes Relative 10.7 3.0 - 12.0 %   Eosinophils Relative 8.9 (H) 0.0 - 5.0 %   Basophils Relative 1.2 0.0 - 3.0 %   Neutro Abs 2.8 1.4 - 7.7 K/uL   Lymphs Abs 1.7 0.7 - 4.0 K/uL   Monocytes Absolute  0.6 0.1 - 1.0 K/uL   Eosinophils Absolute 0.5 0.0 - 0.7 K/uL   Basophils Absolute 0.1 0.0 - 0.1 K/uL  Brain natriuretic peptide  Result Value Ref Range   Pro B Natriuretic peptide (BNP) 113.0 (H) 0.0 - 100.0 pg/mL    Low na Anemia - hx of endometriosis and heavy bleeding as well as hepatic issues/chronic dz and alcoholism  Elevated LFT s worsen   GI ref placed for her etoh hepatitis (she was supposed to be called to schedule)  No more alcohol at all - last drink was before she saw me  Doing ok  [facility] psychologically as well (not alcohol in the house)  No tylenol/   At home taking care of her father  Trying to stay in   Per pt swelling comes and goes  Taking fluid pill - cramping a lot (? If K is low)  She started taking K over the counter 99 mg tablet   Will look for a blood pressure cuff at home  Does not have a scale   Review of Systems  Constitutional: Negative for chills, diaphoresis, fever, malaise/fatigue and weight loss.  HENT: Negative for congestion, ear pain and sore throat.   Eyes: Negative for blurred vision, pain and redness.  Respiratory: Negative for cough, hemoptysis, sputum production, shortness of breath and wheezing.   Cardiovascular: Positive for leg swelling. Negative for chest pain, palpitations, orthopnea and PND.  Gastrointestinal: Negative for diarrhea, nausea and vomiting.  Genitourinary: Negative for frequency and urgency.  Musculoskeletal: Positive for myalgias.  Skin: Negative for itching and rash.  Neurological: Negative for dizziness, tingling and focal weakness.  Psychiatric/Behavioral: The patient is nervous/anxious.      Patient Active Problem List   Diagnosis Date Noted  . Pedal edema 03/31/2018  . Anemia 03/31/2018  . Appetite loss 03/08/2018  . Myofascial pain 03/08/2018  . IBS (irritable bowel syndrome) 03/08/2018  . Alcoholic hepatitis without ascites 03/05/2018  . Elevated LFTs 03/03/2018  . Achilles tendonitis 01/28/2018  .  Abnormal cervical Papanicolaou smear 09/29/2017  . Insomnia 09/29/2017  . Pain 09/29/2017  . Palpitations 05/25/2017  . Chest wall pain 05/25/2017  . Elevated transaminase level 05/25/2017  . Depression with anxiety 02/23/2017  . 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 Hyponatremia 02/17/2017  . Hypokalemia 09/24/2016  . H/O alcohol abuse 09/22/2016  . H/O acute pancreatitis 09/22/2016  . GERD (gastroesophageal reflux disease) 04/09/2015  . 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 Chest pain 08/20/2012  . Stress reaction 07/23/2012  . Hypertension 06/09/2012  . History of pulmonary embolism 06/09/2012  . Preop cardiovascular exam 06/08/2012  . Dysmenorrhea   . Dyspareunia   . Pelvic pain    Past Medical History:  Diagnosis Date  . Anxiety   . Cardiomyopathy (HCC)   . Chronic lower back pain   . Complication of anesthesia    "hard to get under"  . Cyst of ovary   . Depression    hx  . DVT complicating pregnancy 08/1998   RLE; RUE  . Dysmenorrhea   . Dyspareunia   . 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 GERD (gastroesophageal reflux disease)   . Hay fever    "fall and spring" (02/17/2017)  . Headache    "2-3 times/.wik" (02/17/2017)  . History of chicken pox   . Hypertension   . Migraine    "monthly" (02/17/2017)  . Pelvic pain   . Pulmonary embolism (HCC) 08/1998   S/P childbirth   Past Surgical History:  Procedure Laterality Date  . CESAREAN SECTION  1994  . ECTOPIC PREGNANCY SURGERY  2010  . LAPAROSCOPY N/A 09/24/2012   Procedure: OPERATIVE LAPROSCOPY WITH LYSIS OF ADHESIONS;  Surgeon: Sherron Monday, MD;  Location: [facility];  Service: Gynecology;  Laterality: N/A;  . TONSILLECTOMY  1984  . TUBAL LIGATION  2010   Social History   Tobacco Use  . Smoking status: Never Smoker  . Smokeless tobacco: Never Used  Substance Use Topics  . Alcohol use: Not Currently    Alcohol/week: 5.0 standard drinks    Types: 5 Cans of beer per week    Frequency: Never     Comment: has quit (March 2019)  . Drug use: No   Family History  Problem Relation Age of Onset  . Cancer - Lung Mother   . Heart failure Father   . Hypertension Father    No Known Allergies Current Outpatient Medications on File Prior to Visit  Medication Sig Dispense Refill  . hydrochlorothiazide (HYDRODIURIL) 25 MG  tablet Take 1 tablet (25 mg total) by mouth daily as needed. 30 tablet 0  . hydrOXYzine (ATARAX/VISTARIL) 25 MG tablet Take 1 tablet (25 mg total) by mouth every 8 (eight) hours as needed for anxiety or nausea. 90 tablet 1  . Multiple Vitamin (MULTIVITAMIN WITH MINERALS) TABS tablet Take 1 tablet by mouth daily. 30 tablet 0   No current facility-administered medications on file prior to visit.       Observations/Objective: Pt sounds clear and generally positive on the phone    Assessment and Plan: Problem List Items Addressed This Visit      Cardiovascular and Mediastinum   Hypertension    With recent addn of hctz for edema  No dizziness Will try to get a bp cuff for home       Relevant Medications   amLODipine (NORVASC) 10 MG tablet   lisinopril (PRINIVIL,ZESTRIL) 10 MG tablet   metoprolol succinate (TOPROL-XL) 50 MG 24 hr tablet   Other Relevant Orders   Basic metabolic panel   CBC with Differential/Platelet     Digestive   Alcoholic hepatitis without ascites    Currently trying to get in with GI No etoh since before last visit with me  Is stuck in the house/cannot go to AA Needs re check of LFTs Will try to put together a tent/drive in blood draw asap      Relevant Orders   Hepatic function panel     Other   H/O alcohol abuse    With etoh hepatitis  Per pt no etoh since before march  Due for labs Cannot go to AA due to staying in with covid crisis      Hyponatremia    Noted with hctz May eventually need to check this (also K since she has cramping)  Will try to set up tent blood draw as soon as possible       Relevant Orders   Basic  metabolic panel   Elevated transaminase level    Due to alcoholic hepatitis  No etoh since before march 1 per pt  Will re check as soon as we can get her on the lab schedule safely        Relevant Orders   Hepatic function panel   Pedal edema - Primary    In setting of alcoholic hepatitis (no ascites) Clinical imp with hctz but na was low and K may be low (cramping) Will try to set up urgent blood draw (tent) asap  ? If we could safely use spironolactone instead (is also on ace) - will have to decide when labs return  Adv elevation of feet  Avoidance of etoh       Relevant Orders   Basic metabolic panel   Hepatic function panel   Anemia    Due to chronic dz or menses or both  Re check with next labs       Relevant Orders   CBC with Differential/Platelet   Ferritin    Other Visit Diagnoses    Abdominal pain, left upper quadrant       Relevant Medications   omeprazole (PRILOSEC) 20 MG capsule        Follow Up Instructions:    I discussed the assessment and treatment plan with the patient. The patient was provided an opportunity to ask questions and all were answered. The patient agreed with the plan and demonstrated an understanding of the instructions.   The patient was advised to call back or seek  an in-person evaluation if the symptoms worsen or if the condition fails to improve as anticipated.  I provided 16 minutes of non-face-to-face time during this encounter.   Roxy Manns, MD

## 2018-04-01 ENCOUNTER — Telehealth: Payer: Self-pay | Admitting: *Deleted

## 2018-04-01 NOTE — Telephone Encounter (Signed)
Left VM requesting pt to call back and schedule lab appt, when pt calls back please screen for Covid-19 and if negative screening please schedule non-fasting lab appt asap

## 2018-04-01 NOTE — Telephone Encounter (Signed)
-----   Message from Judy Pimple, MD sent at 03/31/2018  7:32 PM EDT ----- Please set her up for outside lab draw as soon as possible- I ordered bmet and liver and cbc Thanks

## 2018-04-02 ENCOUNTER — Other Ambulatory Visit: Payer: 59

## 2018-04-02 NOTE — Telephone Encounter (Signed)
Left 2nd VM requesting pt to call and schedule lab appt

## 2018-04-15 ENCOUNTER — Encounter: Payer: Self-pay | Admitting: *Deleted

## 2018-04-15 NOTE — Telephone Encounter (Signed)
Left 3rd VM and letter mailed requesting pt to call the office and schedule a lab appt

## 2018-04-23 ENCOUNTER — Other Ambulatory Visit: Payer: Self-pay | Admitting: Sports Medicine

## 2018-04-23 ENCOUNTER — Telehealth: Payer: Self-pay | Admitting: *Deleted

## 2018-04-23 DIAGNOSIS — M79673 Pain in unspecified foot: Secondary | ICD-10-CM

## 2018-04-23 MED ORDER — HYDROCODONE-ACETAMINOPHEN 5-325 MG PO TABS: 1.0000 | ORAL_TABLET | Freq: Four times a day (QID) | ORAL | 0 refills | Status: DC | PRN

## 2018-04-23 NOTE — Telephone Encounter (Signed)
Pt states she is to have surgery after COVID-19, but is having pain in the right achilles and would like a refill of hydrocodone.

## 2018-04-23 NOTE — Progress Notes (Signed)
Refilled pain medication until we can do surgery -Dr. Kathie Rhodes

## 2018-04-23 NOTE — Telephone Encounter (Signed)
Left message informing pt the medication had been refilled.

## 2018-05-17 ENCOUNTER — Telehealth: Payer: Self-pay | Admitting: *Deleted

## 2018-05-17 NOTE — Telephone Encounter (Signed)
Left message informing pt of Dr. Wynema Birch message of 3:29pm.

## 2018-05-17 NOTE — Telephone Encounter (Signed)
Pt states her right achilles tendon surgery is delayed and her boot is falling apart, she would like a new boot if insurance will pay and the hydrocodone is not working for pain relief.

## 2018-05-17 NOTE — Telephone Encounter (Signed)
All narcotics work similar, hydrocodone is one of the strongest meds that I can give her at this time. Likely the pain medication isn't helping anymore because she needs a new boot and the pain and inflammation is getting worse. I recommend rest, ice, elevation, topical pain patch and taking 2 Aleve and 1 Tylenol every 6 hours to see if this can give her some additional relief -Dr Marylene Land

## 2018-05-17 NOTE — Telephone Encounter (Signed)
Left message informing pt she should get a new boot if her surgery was delayed and Dr. Marylene Land was wanting the foot stabilized, we would bill insurance but what the insurance, and bill for the remainder. I told pt I would send a message to Dr. Marylene Land concerning the change of pain medication.

## 2018-05-17 NOTE — Telephone Encounter (Signed)
Pt called states she is having pain and swelling and will take the hydrocodone, she states the broken down boot may be part of the problem and her husband will pick up a new boot tomorrow. Pt asked for Dr. Marylene Land to refill the hydrocodone.

## 2018-05-18 ENCOUNTER — Other Ambulatory Visit: Payer: Self-pay | Admitting: Sports Medicine

## 2018-05-18 DIAGNOSIS — M79673 Pain in unspecified foot: Secondary | ICD-10-CM

## 2018-05-18 MED ORDER — HYDROCODONE-ACETAMINOPHEN 5-325 MG PO TABS: 1.0000 | ORAL_TABLET | Freq: Four times a day (QID) | ORAL | 0 refills | Status: AC | PRN

## 2018-05-18 NOTE — Progress Notes (Signed)
Refilled Norco for the patient until time for surgery. -Dr. Marylene Land

## 2018-05-18 NOTE — Telephone Encounter (Signed)
Refill sent to her pharmacy -Dr. Marylene Land

## 2018-05-19 ENCOUNTER — Telehealth: Payer: Self-pay | Admitting: *Deleted

## 2018-05-19 NOTE — Telephone Encounter (Signed)
Pt states she got the replacement boot, but the one she got is the pre-op boot and she needs the post-op boot, because she has not had surgery yet, and the surgery coordinator has not called yet.

## 2018-05-19 NOTE — Telephone Encounter (Signed)
Pt called states she got the boot, but no call from the surgery coordinator.

## 2018-05-19 NOTE — Telephone Encounter (Signed)
Left message informing pt I did not know why she would have been given a different boot that what she had been wearing, give me a call to discuss.

## 2018-05-19 NOTE — Telephone Encounter (Signed)
I called pt, she states she received a surgery shoe not a surgery boot that goes half way up her leg. I told pt I would get the small cam boot and put up front with a note to apply to her insurance.

## 2018-05-28 ENCOUNTER — Other Ambulatory Visit: Payer: Self-pay | Admitting: Sports Medicine

## 2018-05-28 ENCOUNTER — Telehealth: Payer: Self-pay | Admitting: *Deleted

## 2018-05-28 DIAGNOSIS — M79673 Pain in unspecified foot: Secondary | ICD-10-CM

## 2018-05-28 DIAGNOSIS — M7661 Achilles tendinitis, right leg: Secondary | ICD-10-CM

## 2018-05-28 MED ORDER — HYDROCODONE-ACETAMINOPHEN 5-325 MG PO TABS: 1.0000 | ORAL_TABLET | Freq: Four times a day (QID) | ORAL | 0 refills | Status: AC | PRN

## 2018-05-28 MED ORDER — PREDNISONE 10 MG (21) PO TBPK: ORAL_TABLET | ORAL | 0 refills | Status: DC

## 2018-05-28 NOTE — Telephone Encounter (Signed)
Pt states she is calling for a refill of the pain medication and wondered if a steroid would help she is currently taking care of her father who has a serious infection and she is having to be up on her foot more.

## 2018-05-28 NOTE — Progress Notes (Signed)
Refilled pain medication and steroid dose pack for achilles pain. -Dr Marylene Land

## 2018-05-28 NOTE — Telephone Encounter (Signed)
"  I need to schedule my surgery.  I am taking care of my daddy so I need to wait until after June."  Dr. Marylene Land can do it on July 6 or 13.  "I can do it on July 6."  I'll get it scheduled.  "Is there anything else I have to do?"  You need to register online with the surgical center via their portal.  The instructions are in the brochure that we gave you.  "I'll have to get my husband to bring it to me because I've been staying with my dad.  I'll do it next week."

## 2018-05-28 NOTE — Telephone Encounter (Signed)
I informed Dr. Marylene Land of pt's call and request. Dr. Marylene Land states she will call in the requested medications and that pt is going to need to rest and get someone else to help with her ftr, and if symptoms continue even with the medications she is going to need to have surgery. Left message informing pt of Dr. Wynema Birch orders.

## 2018-05-28 NOTE — Telephone Encounter (Signed)
Pt called states she received my message, and her ftr will be on the antibiotic for 5 weeks and she would like to go ahead and have the surgery scheduled for once he has completed his medication. I informed pt of Dorene Grebe - Surgery Coordinator's [phone] and transferred to that line.

## 2018-06-16 IMAGING — CT CT RENAL STONE PROTOCOL
2 of 4 series · 16 of 46 positions shown, 18 images · non-contrast
Comparison: CT of the abdomen and pelvis performed 01/02/2017

CLINICAL DATA: Acute onset of right flank pain and left posterior
chest pain.

EXAM:
CT ABDOMEN AND PELVIS WITHOUT CONTRAST
TECHNIQUE: Multidetector CT imaging of the abdomen and pelvis was performed
following the standard protocol without IV contrast.

[Series 2: axial st · axial · 0.73mm/px · z∈[-464,-54]mm · 13 of 94 slices shown, 15 images]
[im 6/94  soft-tissue]
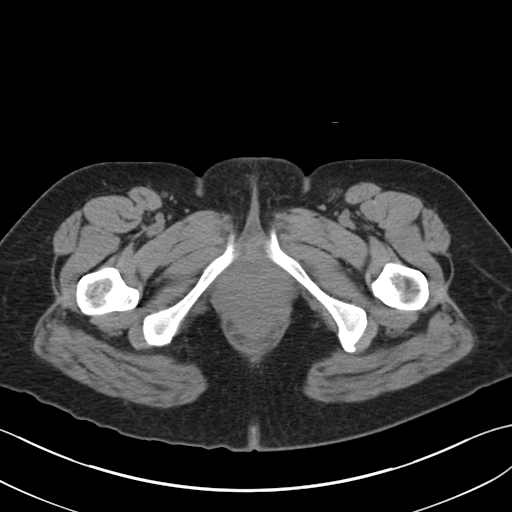
[im 6/94  bone]
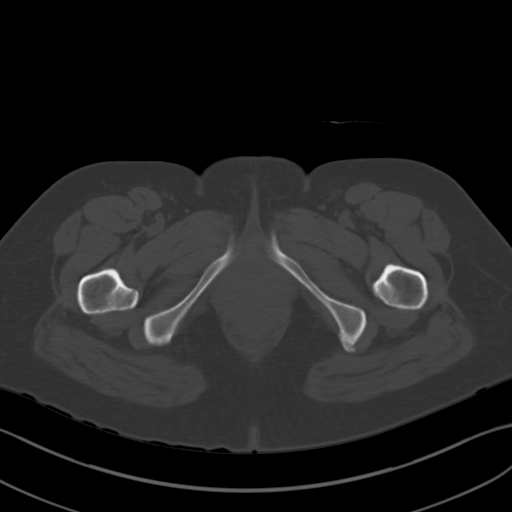
[im 11/94  soft-tissue]
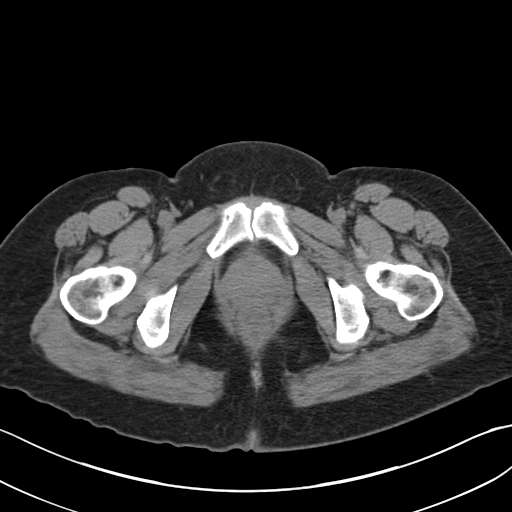
[im 21/94  soft-tissue]
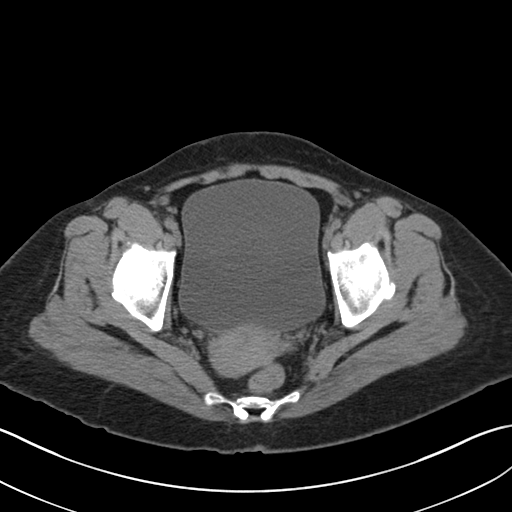
[im 26/94  soft-tissue]
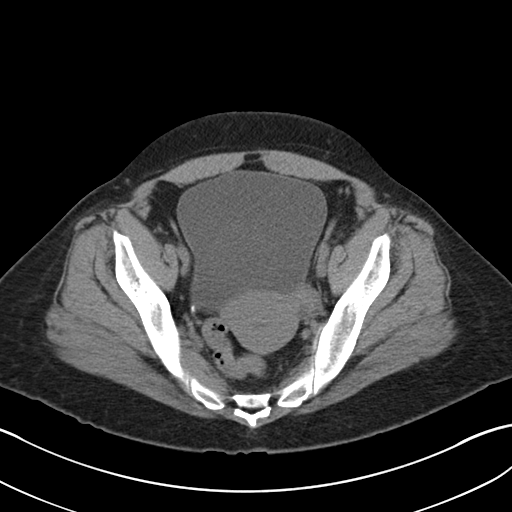
[im 32/94  soft-tissue]
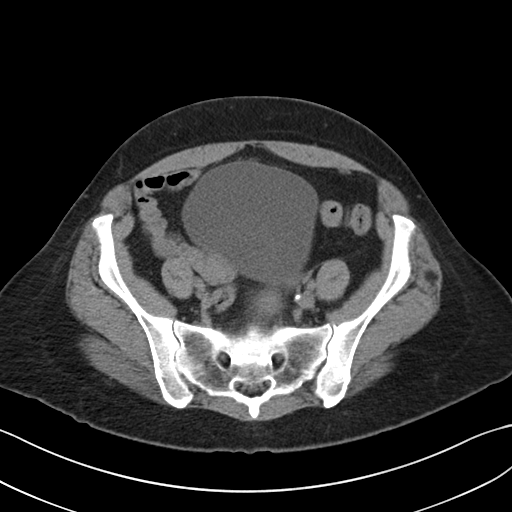
[im 42/94  soft-tissue]
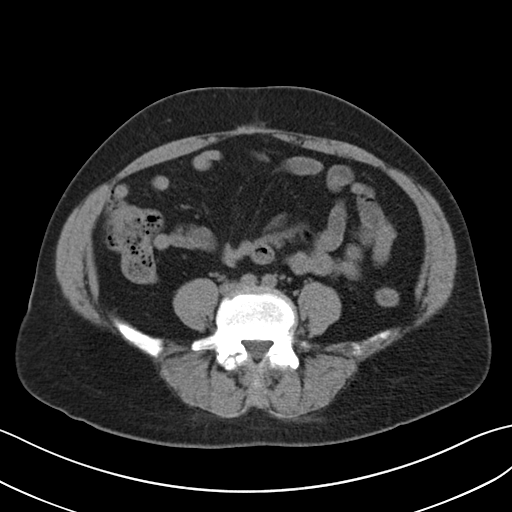
[im 47/94  soft-tissue]
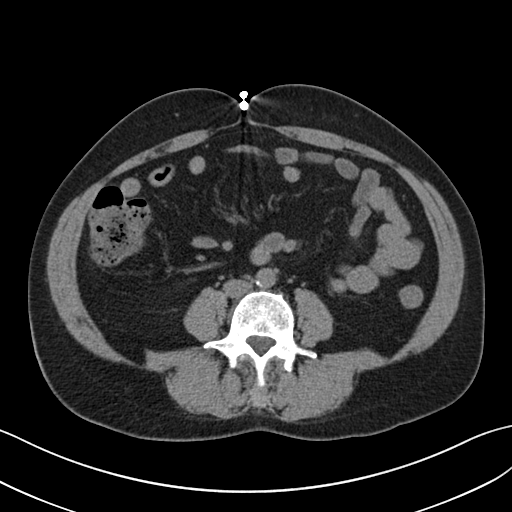
[im 52/94  soft-tissue]
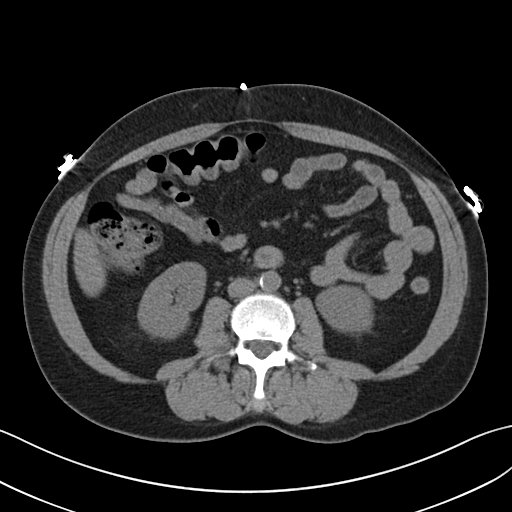
[im 63/94  soft-tissue]
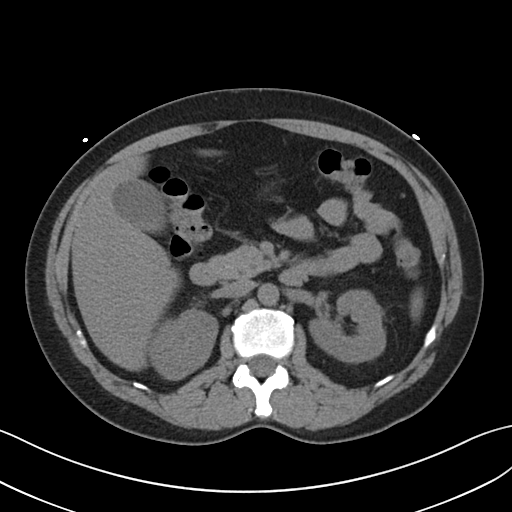
[im 63/94  bone]
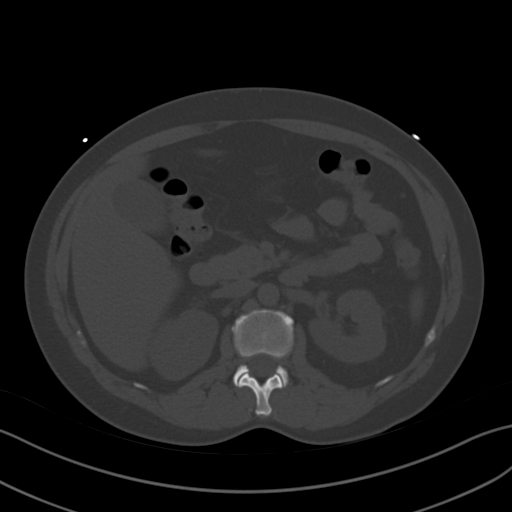
[im 68/94  soft-tissue]
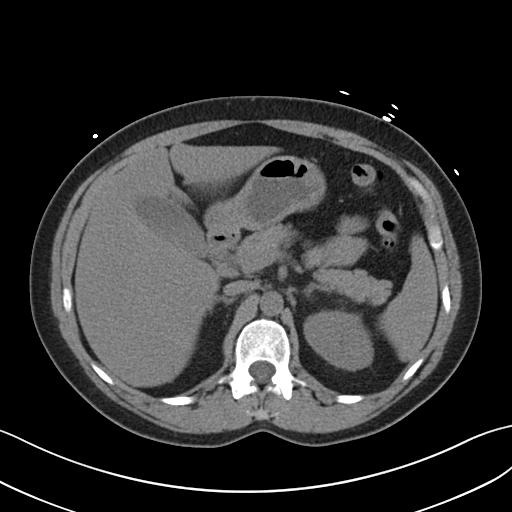
[im 73/94  soft-tissue]
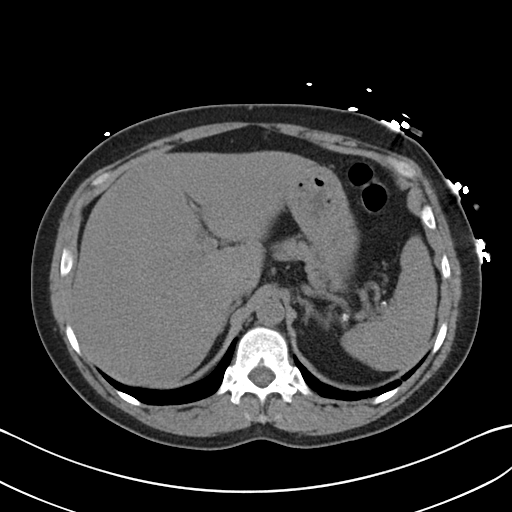
[im 83/94  soft-tissue]
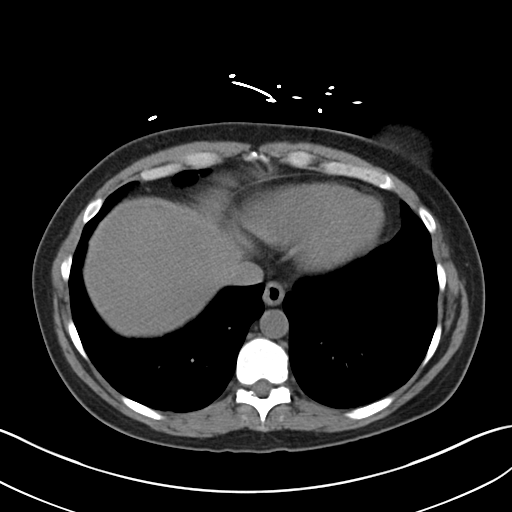
[im 88/94  soft-tissue]
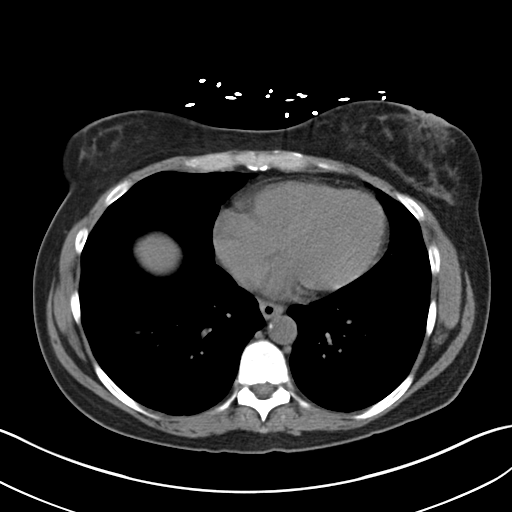

[Series 5: coronal · coronal · 0.70mm/px · 3 of 136 slices shown]
[im 46/136  soft-tissue]
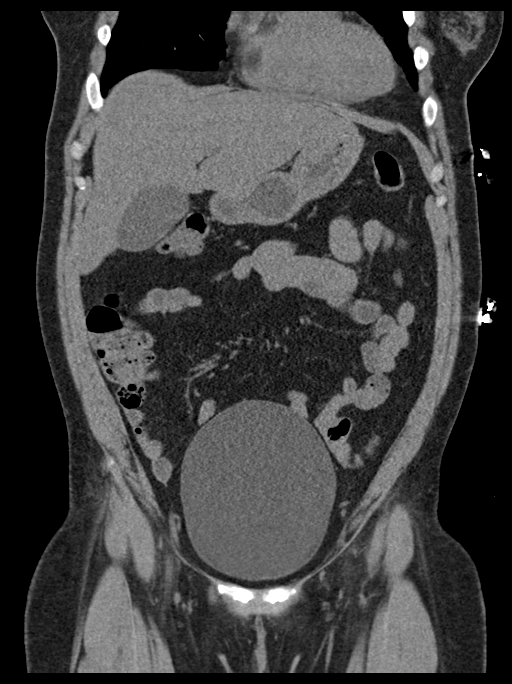
[im 61/136  soft-tissue]
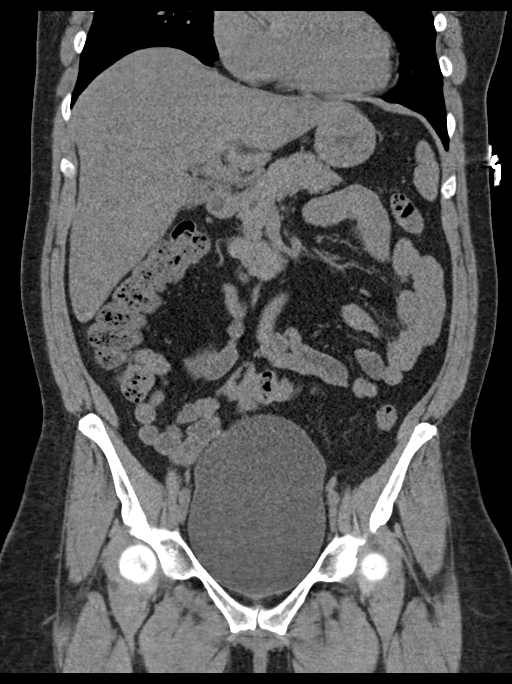
[im 76/136  soft-tissue]
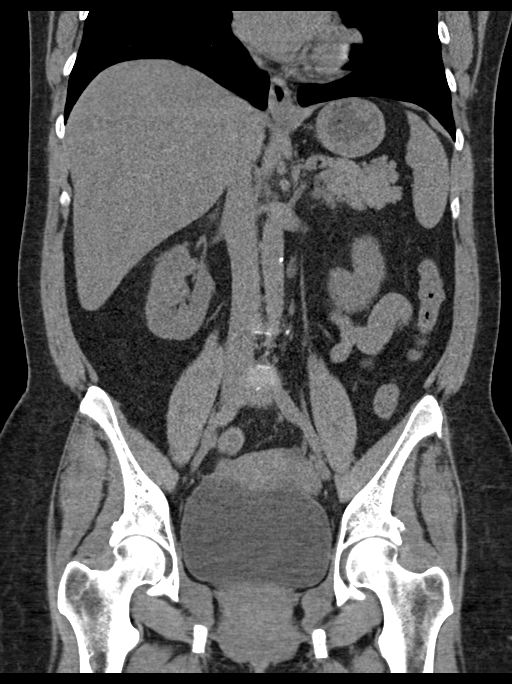

[16 of 46 positions shown; findings below may reference images not displayed]

FINDINGS: Lower chest: Minimal bibasilar atelectasis is noted. The visualized
portions of the mediastinum are unremarkable.

Hepatobiliary: The liver is unremarkable in appearance. The
gallbladder is unremarkable in appearance. The common bile duct
remains normal in caliber.

Pancreas: The pancreas is within normal limits.

Spleen: The spleen is unremarkable in appearance.

Adrenals/Urinary Tract: The adrenal glands are unremarkable in
appearance. Nonspecific perinephric stranding is noted bilaterally.
There is no evidence of hydronephrosis. No renal or ureteral stones
are identified.

Stomach/Bowel: The stomach is unremarkable in appearance. The small
bowel is within normal limits. The appendix is normal in caliber,
without evidence of appendicitis. The colon is unremarkable in
appearance.

Vascular/Lymphatic: Scattered calcification is seen along the
abdominal aorta and its branches. The abdominal aorta is otherwise
grossly unremarkable. The inferior vena cava is grossly
unremarkable. No retroperitoneal lymphadenopathy is seen. No pelvic
sidewall lymphadenopathy is identified.

Reproductive: The bladder is significantly distended and within
normal limits. The uterus is grossly unremarkable in appearance. The
ovaries are relatively symmetric. No suspicious adnexal masses are
seen.

Other: No additional soft tissue abnormalities are seen.

Musculoskeletal: No acute osseous abnormalities are identified. The
visualized musculature is unremarkable in appearance.
IMPRESSION: No acute abnormality seen within the abdomen or pelvis.

Aortic Atherosclerosis (A7M31-D58.8).

## 2018-06-16 IMAGING — CR DG CHEST 2V
2 series · 2 of 2 positions shown · non-contrast
Comparison: 04/15/2017

CLINICAL DATA: Chest pain, right back pain

EXAM:
CHEST - 2 VIEW

[w chest pa]
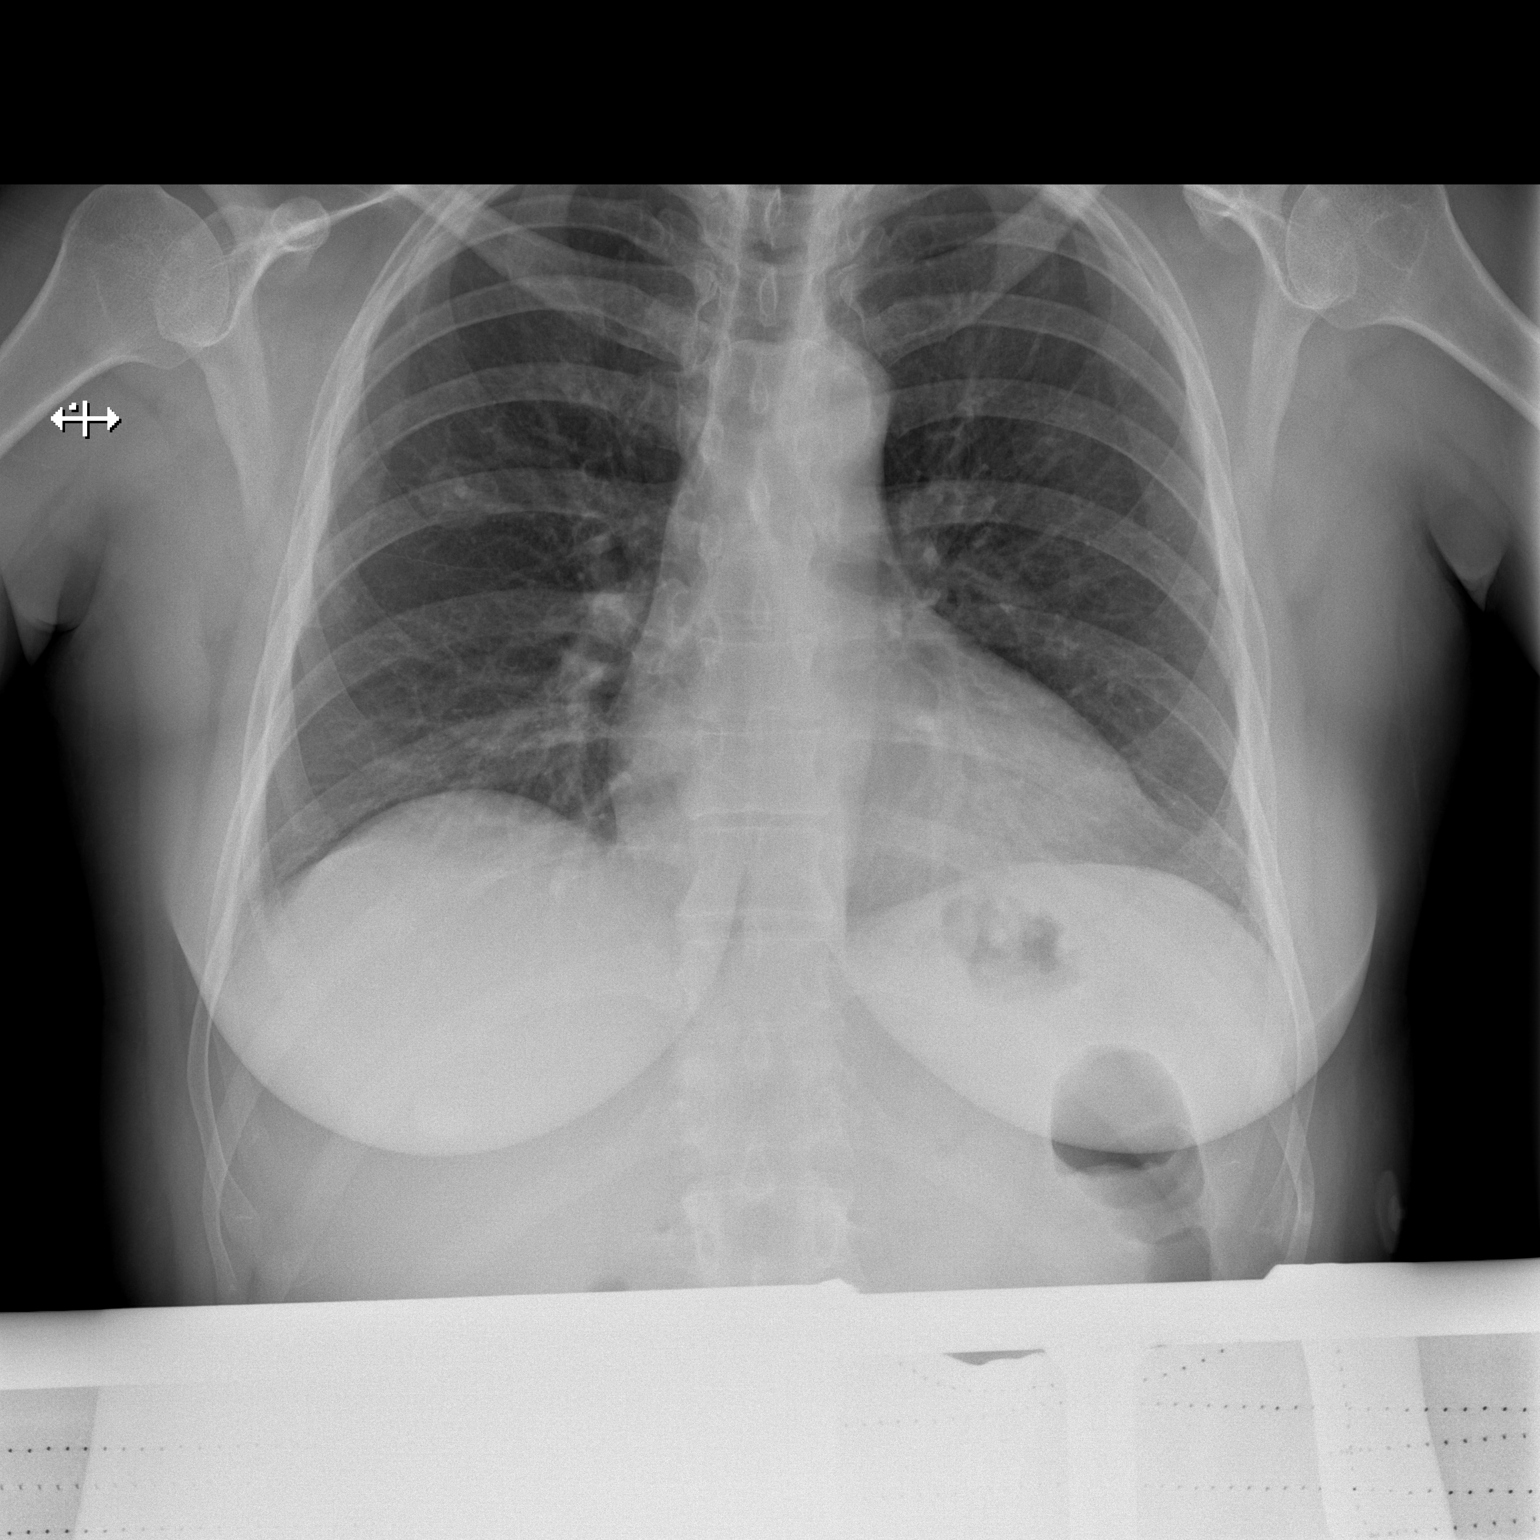

[w chest lat]
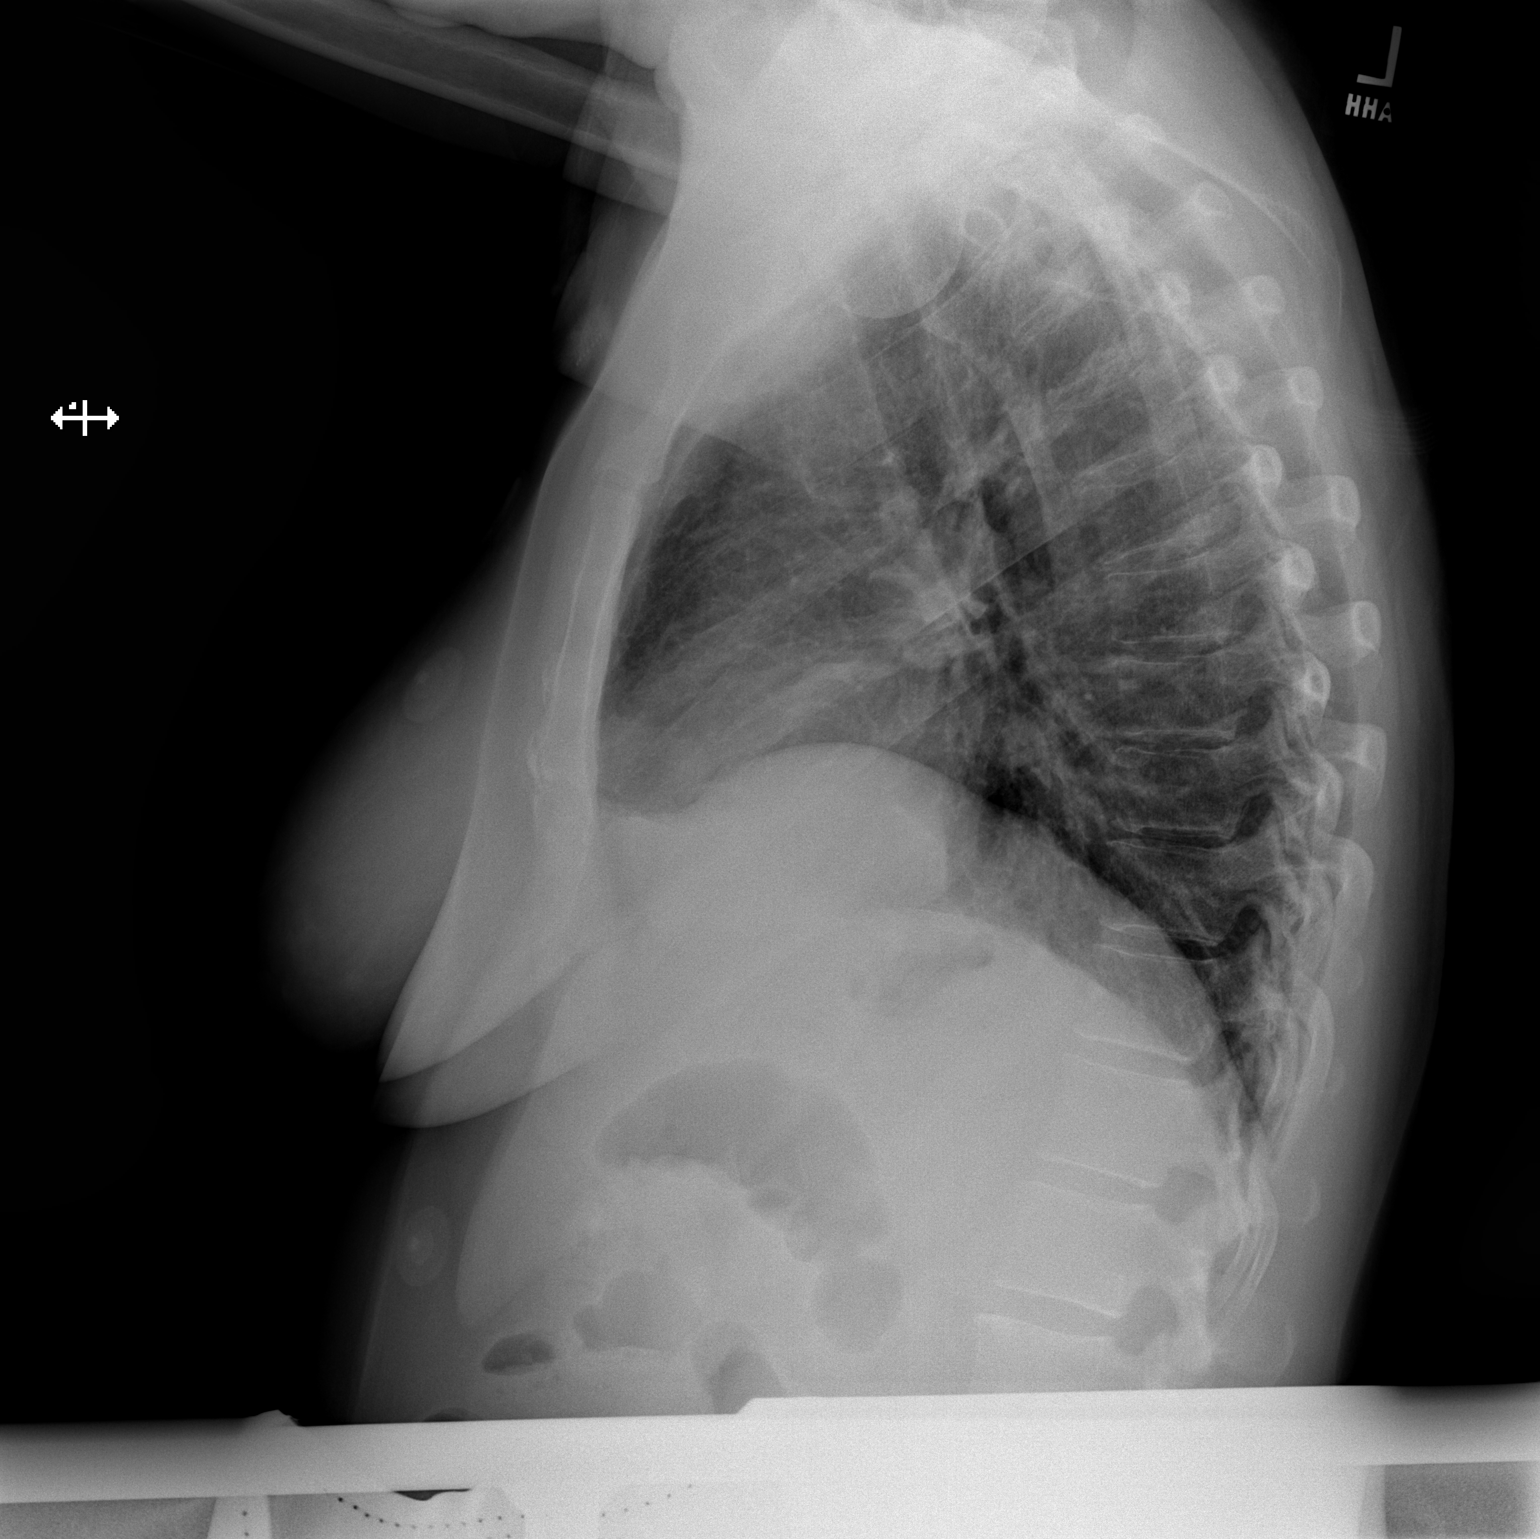

[2 of 2 positions shown; findings below may reference images not displayed]

FINDINGS: Heart and mediastinal contours are within normal limits. No focal
opacities or effusions. No acute bony abnormality.
IMPRESSION: No active cardiopulmonary disease.

## 2018-06-18 ENCOUNTER — Encounter: Payer: Self-pay | Admitting: Family Medicine

## 2018-06-21 ENCOUNTER — Other Ambulatory Visit: Payer: Self-pay

## 2018-06-21 ENCOUNTER — Ambulatory Visit: Payer: 59 | Admitting: Family Medicine

## 2018-06-21 DIAGNOSIS — W57XXXA Bitten or stung by nonvenomous insect and other nonvenomous arthropods, initial encounter: Secondary | ICD-10-CM | POA: Insufficient documentation

## 2018-06-21 NOTE — Progress Notes (Signed)
Virtual Visit via Video Note  I connected with Nicole Hughes on 06/21/18 at  3:00 PM EDT by a video enabled telemedicine application and verified that I am speaking with the correct person using two identifiers.  Location: Patient: home Provider: office    I discussed the limitations of evaluation and management by telemedicine and the availability of in person appointments. The patient expressed understanding and agreed to proceed.  History of Present Illness: Pt presents for tick bite (with red ring around it) Has a headache and feels achy)  Tried to reach pt  She never responded to text for doxy visit   Her phone is busy - called 4 times and had to give up    Observations/Objective:   Assessment and Plan:   Follow Up Instructions:    I discussed the assessment and treatment plan with the patient. The patient was provided an opportunity to ask questions and all were answered. The patient agreed with the plan and demonstrated an understanding of the instructions.   The patient was advised to call back or seek an in-person evaluation if the symptoms worsen or if the condition fails to improve as anticipated.  I provided 0 minutes of non-face-to-face time during this encounter.   Loura Pardon, MD

## 2018-06-23 ENCOUNTER — Telehealth: Payer: Self-pay | Admitting: *Deleted

## 2018-06-23 ENCOUNTER — Telehealth: Payer: Self-pay

## 2018-06-23 ENCOUNTER — Encounter: Payer: Self-pay | Admitting: Family Medicine

## 2018-06-23 ENCOUNTER — Ambulatory Visit (INDEPENDENT_AMBULATORY_CARE_PROVIDER_SITE_OTHER): Payer: 59 | Admitting: Family Medicine

## 2018-06-23 VITALS — BP 168/94 | Temp 99.6°F | Wt 167.0 lb

## 2018-06-23 DIAGNOSIS — R51 Headache: Secondary | ICD-10-CM

## 2018-06-23 DIAGNOSIS — S70362A Insect bite (nonvenomous), left thigh, initial encounter: Secondary | ICD-10-CM | POA: Diagnosis not present

## 2018-06-23 DIAGNOSIS — W57XXXA Bitten or stung by nonvenomous insect and other nonvenomous arthropods, initial encounter: Secondary | ICD-10-CM

## 2018-06-23 DIAGNOSIS — R519 Headache, unspecified: Secondary | ICD-10-CM

## 2018-06-23 DIAGNOSIS — R112 Nausea with vomiting, unspecified: Secondary | ICD-10-CM

## 2018-06-23 MED ORDER — ONDANSETRON 8 MG PO TBDP: 8.0000 mg | ORAL_TABLET | Freq: Three times a day (TID) | ORAL | 0 refills | Status: DC | PRN

## 2018-06-23 MED ORDER — DOXYCYCLINE HYCLATE 100 MG PO CAPS: 100.0000 mg | ORAL_CAPSULE | Freq: Two times a day (BID) | ORAL | 0 refills | Status: DC

## 2018-06-23 NOTE — Patient Instructions (Signed)
Hi Nicole Hughes,  Good to see you for your virtual visit today.  I hope that by the time you get this you are feeling much better!  It is hard to know for certain if you have a tick born illness, but you were prescribed an antibiotic to be on the safe side.  Please follow up if not improved in a couple of days or if your symptoms worsen.  Warm regards,  Tor Netters, FNP-BC     Ehrlichiosis and Anaplasmosis Ehrlichiosis and anaplasmosis are infections that people get from ticks. Symptoms often show up 1-2 weeks after a tick bite. You may feel like you have the flu. Symptoms may include:  Fever.  Chills.  Headache.  Muscle aches.  Joint pain.  Tiredness.  Feeling sick to your stomach (nausea).  Throwing up (vomiting).  Watery poop (diarrhea).  Rash.  Cough.  Tingling hands or feet.  Confusion.  Stiff neck. You may not have any symptoms. How is this diagnosed? Your doctor may diagnose you based on your symptoms, a physical exam, and blood tests. Your doctor will also ask if you:  Were bitten by a tick recently.  Live, work, or travel in areas where ticks are common.  Have family, pets, or co-workers who have a disease from a tick.  Had another disease carried by ticks before.  Are pregnant or breastfeeding. How is this treated? Treatment may start right away. This may include:  Antibiotic medicines for 1-2 weeks.  Staying at the hospital, if your case is very bad.  IV fluids.  Over-the-counter medicine for fever. Follow these instructions at home:   Take over-the-counter and prescription medicines only as told by your doctor.  Take your antibiotic medicine as told by your doctor. Do not stop taking the antibiotic even if you start to feel better.  Drink enough fluid to keep your pee (urine) pale yellow.  Rest at home while you get better. Return to your normal activities as told by your doctor.  Ask your doctor how you can prevent tick bites in the  future.  Keep all follow-up visits as told by your doctor. This is important. Contact a doctor if:  Your symptoms get worse or do not get better.  You still have a headache and weakness after your other symptoms have gone away. Get help right away if:  You have bleeding that does not stop after a few minutes, such as bleeding from a small cut or scrape.  You have a seizure.  You have trouble breathing. Summary  Ehrlichiosis and anaplasmosis are infections that people get from ticks.  You may not have any symptoms. For many people, symptoms feel like the flu.  Treatment may include taking antibiotic medicine for 1-2 weeks. This information is not intended to replace advice given to you by your health care provider. Make sure you discuss any questions you have with your health care provider. Document Released: 10/20/2008 Document Revised: 10/08/2016 Document Reviewed: 10/08/2016 Elsevier Interactive Patient Education  2019 [employer].

## 2018-06-23 NOTE — Telephone Encounter (Signed)
[facility] - Client TELEPHONE ADVICE RECORD [facility] Patient Name: Nicole Hughes Gender: Female DOB: [birth year] Age: 48 Y 2 M 21 D Return Phone Number: [phone] (Primary) Address: City/State/Zip: [place] [address] Client [facility] - Client Client Site [facility], Roque Lias - MD Contact Type Call Who Is Calling Patient / Member / Family / Caregiver Call Type Triage / Clinical Relationship To Patient Self Return Phone Number [phone] (Primary) Chief Complaint Tick Bite Reason for Call Symptomatic / Request for Health Information Initial Comment Caller was supposed to have a virtual appointment yesterday but missed appointment. Has a tick bite, a week ago, headache at base of head. Swelling on right side of face and pressure on throat. No fever, no appetite. Muscle pain on left side near bite and redness. Translation No Nurse Assessment Nurse: Asa Lente, RN, Belenda Cruise Date/Time (Eastern Time): 06/22/2018 5:18:51 PM Confirm and document reason for call. If symptomatic, describe symptoms. ---Tick removed one week ago. Bump under skin on hip. Head ache from base of skull entire head. Pain in hip comes and goes. Body feels heavy. No fever. Started feeling bad since Sunday. Throat swelling on left side and jaw line. All pain and swelling on left side. Virtual appointment was unsuccessful yesterday. Taking care of sick father at home. unable to go to the hospital. Has the patient had close contact with a person known or suspected to have the novel coronavirus illness OR traveled / lives in area with major community spread (including international travel) in the last 14 days from the onset of symptoms? * If Asymptomatic, screen for exposure and travel within the last 14 days. ---No Does the patient have any new or worsening symptoms? ---Yes Will a triage be completed?  ---Yes Related visit to physician within the last 2 weeks? ---Yes Does the PT have any chronic conditions? (i.e. diabetes, asthma, this includes High risk factors for pregnancy, etc.) ---Yes List chronic conditions. ---HTN, heartburn Is the patient pregnant or possibly pregnant? (Ask all females between the ages of 61-55) ---No Is this a behavioral health or substance abuse call? ---No PLEASE NOTE: All timestamps contained within this report are represented as [place] Standard Time. CONFIDENTIALTY NOTICE: This fax transmission is intended only for the addressee. It contains information that is legally privileged, confidential or otherwise protected from use or disclosure. If you are not the intended recipient, you are strictly prohibited from reviewing, disclosing, copying using or disseminating any of this information or taking any action in reliance on or regarding this information. If you have received this fax in error, please notify us immediately by telephone so that we can arrange for its return to [place]. Phone: [phone], Toll-Free: [phone], Fax: [phone] Page: 2 of 2 Call Id: [id number] Guidelines Guideline Title Affirmed Question Affirmed Notes Nurse Date/Time Eilene Ghazi Time) Tick Bite [1] 2 to 14 days following tick bite AND [2] widespread rash or headache AND [3] no fever Rae Mar 06/22/2018 5:24:21 PM Disp. Time Eilene Ghazi Time) Disposition Final User 06/22/2018 5:29:52 PM See PCP within 24 Hours Yes Asa Lente, RN, Berneta Sages Disagree/Comply Comply Caller Understands Yes PreDisposition Call Doctor Care Advice Given Per Guideline SEE PCP WITHIN 24 HOURS: * IF OFFICE WILL BE OPEN: You need to be seen within the next 24 hours. Call your doctor (or NP/PA) when the office opens and make an appointment. * Take 400 mg (two 200 mg pills) by mouth every 6 hours as  needed. IBUPROFEN (E.G., MOTRIN, ADVIL): * Take 650 mg (two 325 mg pills) by mouth every 4-6  hours as needed. Each Regular Strength Tylenol pill has 325 mg of acetaminophen. The most you should take each day is 3,250 mg (10 Regular Strength pills a day). ACETAMINOPHEN (E.G., TYLENOL): CALL BACK IF: * Fever occurs * You become worse. CARE ADVICE given per Tick Bites (Adult) guideline. After Care Instructions Given Call Event Type User Date / Time Description Education document email [place] ParkFaller, RN, Katherine 06/22/2018 5:31:57 PM [facility] Instructions Comments User: Elige KoKatherine, Faller, RN Date/Time Lamount Cohen(Eastern Time): 06/22/2018 5:25:09 PM rash on ankles. small red circle around bite area. Referrals REFERRED TO PCP OFFICE REFERRED TO PCP OFFICE

## 2018-06-23 NOTE — Progress Notes (Signed)
Virtual Visit via Video Note  I connected with Nicole Hughes on 06/23/18 at  4:00 PM EDT by a video enabled telemedicine application and verified that I am speaking with the correct person using two identifiers.  Location: Patient: In her home Provider: [place]   I discussed the limitations of evaluation and management by telemedicine and the availability of in person appointments. The patient expressed understanding and agreed to proceed.  History of Present Illness: This is a 48 year old female who requests virtual visit today for a tick bite.  She reports that she had a tick about a week ago in her left hip area.  She is unsure how long it was there but reports that it was engorged.  It was smaller than eraser head.  She has been having headache that feels like a vice around her head as well as muscle aches, and nausea times several days.  She had a temperature of 100.4 today.  She reports some redness of her legs but no bull's-eye rash.  She has an area of firmness where she removed the tick bite.  She denies any exposures to anyone with coronavirus.  She is primarily been taking care of her father as he needs frequent antibiotics that she administers IV for him.  Past Medical History:  Diagnosis Date  . Anxiety   . Cardiomyopathy ([place])   . Chronic lower back pain   . Complication of anesthesia    "hard to get under"  . Cyst of ovary   . Depression    hx  . DVT complicating pregnancy 29/5284   RLE; RUE  . Dysmenorrhea   . Dyspareunia   . 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 GERD (gastroesophageal reflux disease)   . Hay fever    "fall and spring" (02/17/2017)  . Headache    "2-3 times/.wik" (02/17/2017)  . History of chicken pox   . Hypertension   . Migraine    "monthly" (02/17/2017)  . Pelvic pain   . Pulmonary embolism ([place]) 08/1998   S/P childbirth   Past Surgical History:  Procedure Laterality Date  . CESAREAN SECTION  1994  . ECTOPIC  PREGNANCY SURGERY  2010  . LAPAROSCOPY N/A 09/24/2012   Procedure: OPERATIVE LAPROSCOPY WITH LYSIS OF ADHESIONS;  Surgeon: Thornell Sartorius, MD;  Location: [facility];  Service: Gynecology;  Laterality: N/A;  . TONSILLECTOMY  1984  . TUBAL LIGATION  2010   Family History  Problem Relation Age of Onset  . Cancer - Lung Mother   . Heart failure Father   . Hypertension Father    Social History   Tobacco Use  . Smoking status: Never Smoker  . Smokeless tobacco: Never Used  Substance Use Topics  . Alcohol use: Not Currently    Alcohol/week: 5.0 standard drinks    Types: 5 Cans of beer per week    Frequency: Never    Comment: has quit (March 2019)  . Drug use: No      Observations/Objective: Patient is alert and answers questions appropriately.  She is normally conversive without shortness of breath.  She is able to show me an area on her left lateral thigh that is mildly erythematous and does not appear to have any drainage.  BP (!) 168/94 Comment: per patient  Temp 99.6 F (37.6 C) Comment: per patient  Wt 167 lb (75.8 kg) Comment: per patient  LMP 05/26/2018   BMI 27.79 kg/m  Wt Readings from Last 3 Encounters:  06/23/18  167 lb (75.8 kg)  03/17/18 175 lb 8 oz (79.6 kg)  03/08/18 162 lb 9 oz (73.7 kg)    Assessment and Plan: 1. Tick bite of left thigh, initial encounter -Site of bite does not appear infected, mildly irritated.  Given elevated temperature, myalgias, headache will go ahead and treat for tickborne illness with doxycycline. -Return to clinic/follow-up instructions given to patient - doxycycline (VIBRAMYCIN) 100 MG capsule; Take 1 capsule (100 mg total) by mouth 2 (two) times daily.  Dispense: 14 capsule; Refill: 0  2. Non-intractable vomiting with nausea, unspecified vomiting type - ondansetron (ZOFRAN-ODT) 8 MG disintegrating tablet; Take 1 tablet (8 mg total) by mouth every 8 (eight) hours as needed for nausea.  Dispense: 20 tablet; Refill: 0  3. Acute  nonintractable headache, unspecified headache type -Discussed use over-the-counter analgesics, good fluid intake and follow-up if no improvement in 24 to 48 hours or if symptoms acutely worsen   Olean Reeeborah Ariann Khaimov, FNP-BC  [facility]  06/23/2018 4:39 PM   Follow Up Instructions: Patient without access to her MyChart account so AVS mailed to her home address   I discussed the assessment and treatment plan with the patient. The patient was provided an opportunity to ask questions and all were answered. The patient agreed with the plan and demonstrated an understanding of the instructions.   The patient was advised to call back or seek an in-person evaluation if the symptoms worsen or if the condition fails to improve as anticipated.   Emi Belfasteborah B Oak Dorey, FNP

## 2018-06-23 NOTE — Telephone Encounter (Signed)
"  I'm a patient of Dr. Cannon Kettle.  I need to reschedule my surgery.  Something has happened.  I'm not going to be able to do it until our insurance renews the first of August.  So I'm looking for the first or second week of August if it's possible.  I'm also going to need the information for the surgical center because, since my dad has been in the hospital, I've misplaced it.  Please call me."

## 2018-06-23 NOTE — Telephone Encounter (Signed)
Nicole Hughes triaged pt and since PCP had no openings appt scheduled with Nicole Netters, NP today

## 2018-06-24 NOTE — Telephone Encounter (Signed)
I attempted to return her call.  I left her a message to call me back.  I also informed her that any Mondays in August are available dates.

## 2018-06-30 NOTE — Telephone Encounter (Signed)
"  I'm a patient of Dr. Cannon Kettle.  I'm trying to call and reschedule my surgery with her.  I need to cancel the surgery for July 6 and see if we can reschedule it for the second or third week in August when my insurance renews."

## 2018-07-01 NOTE — Telephone Encounter (Signed)
I attempted to return her call.  I left her a message to give me a call back on tomorrow.

## 2018-07-02 NOTE — Telephone Encounter (Signed)
"  Sorry I missed your call.  We're playing phone tag.  I want to reschedule my surgery to August.  I'm still taking care of my dad from his surgery."  Do you have a date in August that you would like to do it on?  "I'd like to do it anytime after August 6."  Dr. Cannon Kettle can do it on August 16, 2018.  "August 10 will be perfect.  Thank you so much."  I hope your dad recovers soon.  "He's getting there."  I rescheduled her surgery from 07/12/2018 to 08/16/2018 via the surgical center's [employer] Portal.

## 2018-07-20 ENCOUNTER — Telehealth: Payer: Self-pay | Admitting: Sports Medicine

## 2018-07-20 ENCOUNTER — Other Ambulatory Visit (INDEPENDENT_AMBULATORY_CARE_PROVIDER_SITE_OTHER): Payer: 59 | Admitting: Sports Medicine

## 2018-07-20 DIAGNOSIS — M7660 Achilles tendinitis, unspecified leg: Secondary | ICD-10-CM

## 2018-07-20 DIAGNOSIS — M79673 Pain in unspecified foot: Secondary | ICD-10-CM

## 2018-07-20 MED ORDER — HYDROCODONE-ACETAMINOPHEN 5-325 MG PO TABS: 1.0000 | ORAL_TABLET | Freq: Four times a day (QID) | ORAL | 0 refills | Status: AC | PRN

## 2018-07-20 NOTE — Telephone Encounter (Signed)
Left message informing pt of Dr. Stover's orders. 

## 2018-07-20 NOTE — Telephone Encounter (Signed)
I have sent a refilled to her pharmacy for this time only -Dr. Cannon Kettle

## 2018-07-20 NOTE — Progress Notes (Signed)
Refilled pain medication for this time only. Patient is scheduled for surgery. -Dr. Cannon Kettle

## 2018-07-20 NOTE — Telephone Encounter (Signed)
Pt called requesting a refill on her pain medication. Pt is scheduled for surgery but would like to have a refill sent in until she is able to have the surgery.

## 2018-08-09 ENCOUNTER — Telehealth: Payer: Self-pay | Admitting: Sports Medicine

## 2018-08-09 NOTE — Telephone Encounter (Signed)
I'm scheduled for surgery this coming Monday and I just need the time I need to be at the surgical center. I do still have the cleaner to clean my foot the night before but have misplaced my paperwork which states what I need to do and when I cannot eat due to the surgery being rescheduled a couple of times. Please call me back. Thank you.

## 2018-08-09 NOTE — Telephone Encounter (Signed)
Called pt to let her know that [facility] will call her this Thursday or Friday with what time she needs to arrive Monday for her surgery. Pt asked me to mail her the pre-operative instructions so she can have those to follow prior to going in for her surgery. Confirmed pt's mailing address on the phone with her.

## 2018-08-16 ENCOUNTER — Encounter: Payer: Self-pay | Admitting: Sports Medicine

## 2018-08-16 ENCOUNTER — Other Ambulatory Visit: Payer: Self-pay | Admitting: Sports Medicine

## 2018-08-16 ENCOUNTER — Telehealth: Payer: Self-pay | Admitting: *Deleted

## 2018-08-16 DIAGNOSIS — M7661 Achilles tendinitis, right leg: Secondary | ICD-10-CM | POA: Diagnosis not present

## 2018-08-16 DIAGNOSIS — G8918 Other acute postprocedural pain: Secondary | ICD-10-CM

## 2018-08-16 DIAGNOSIS — K5903 Drug induced constipation: Secondary | ICD-10-CM

## 2018-08-16 DIAGNOSIS — R112 Nausea with vomiting, unspecified: Secondary | ICD-10-CM

## 2018-08-16 DIAGNOSIS — T402X5A Adverse effect of other opioids, initial encounter: Secondary | ICD-10-CM

## 2018-08-16 DIAGNOSIS — M79673 Pain in unspecified foot: Secondary | ICD-10-CM

## 2018-08-16 DIAGNOSIS — M7731 Calcaneal spur, right foot: Secondary | ICD-10-CM | POA: Diagnosis not present

## 2018-08-16 MED ORDER — IBUPROFEN 800 MG PO TABS: 800.0000 mg | ORAL_TABLET | Freq: Three times a day (TID) | ORAL | 0 refills | Status: DC | PRN

## 2018-08-16 MED ORDER — DOCUSATE SODIUM 100 MG PO CAPS: 100.0000 mg | ORAL_CAPSULE | Freq: Two times a day (BID) | ORAL | 0 refills | Status: DC

## 2018-08-16 MED ORDER — HYDROMORPHONE HCL 4 MG PO TABS: 4.0000 mg | ORAL_TABLET | ORAL | 0 refills | Status: DC | PRN

## 2018-08-16 MED ORDER — PROMETHAZINE HCL 25 MG PO TABS: 25.0000 mg | ORAL_TABLET | Freq: Three times a day (TID) | ORAL | 0 refills | Status: DC | PRN

## 2018-08-16 NOTE — Progress Notes (Signed)
s/p R Achilles tendon repair with removal of heel spur performed at [facility] on 08-16-18 performed by Dr. Cannon Kettle

## 2018-08-16 NOTE — Telephone Encounter (Signed)
Faxed orders for knee scooter to [facility], emailed to M. Stenson, A. Catron.

## 2018-08-16 NOTE — Progress Notes (Signed)
Please Rx Knee Scooter. Patient is s/p R Achilles tendon repair with removal of heel spur performed at [facility] on 08-16-18. Patient will need scooter for 4-6 weeks and must be NWB on right foot -Dr. Cannon Kettle

## 2018-08-16 NOTE — Progress Notes (Signed)
Post op meds entered  -Dr. Loreda Silverio 

## 2018-08-17 ENCOUNTER — Telehealth: Payer: Self-pay | Admitting: Sports Medicine

## 2018-08-17 ENCOUNTER — Telehealth: Payer: Self-pay | Admitting: *Deleted

## 2018-08-17 NOTE — Telephone Encounter (Signed)
Post op check phone call made to patient. Patient did now answer. Left voicemail with instructions to call office if there are any post op questions or concerns. -Dr. Cannon Kettle

## 2018-08-17 NOTE — Telephone Encounter (Signed)
Ddr. Cannon Kettle states if pt can get in today that would be good if not she can see Gretta Arab, RN tomorrow. I asked pt if she could come in today and she stated no, but could tomorrow. I told pt I would have one of the schedulers call to schedule. I told pt I had ordered the knee scooter yesterday and would check the status.

## 2018-08-17 NOTE — Telephone Encounter (Signed)
Pt called states she was moving around and felt a pop in her surgery foot and wanted to know about getting a knee scooter.

## 2018-08-18 ENCOUNTER — Ambulatory Visit (INDEPENDENT_AMBULATORY_CARE_PROVIDER_SITE_OTHER): Payer: Self-pay | Admitting: Podiatry

## 2018-08-18 ENCOUNTER — Other Ambulatory Visit: Payer: Self-pay

## 2018-08-18 ENCOUNTER — Ambulatory Visit (INDEPENDENT_AMBULATORY_CARE_PROVIDER_SITE_OTHER): Payer: 59

## 2018-08-18 VITALS — Temp 97.6°F

## 2018-08-18 DIAGNOSIS — M7661 Achilles tendinitis, right leg: Secondary | ICD-10-CM | POA: Diagnosis not present

## 2018-08-18 DIAGNOSIS — Z09 Encounter for follow-up examination after completed treatment for conditions other than malignant neoplasm: Secondary | ICD-10-CM

## 2018-08-18 DIAGNOSIS — G8918 Other acute postprocedural pain: Secondary | ICD-10-CM

## 2018-08-18 NOTE — Progress Notes (Signed)
Subjective:   Patient ID: Nicole Hughes, female   DOB: 48 y.o.   MRN: [id number]   HPI Patient states she felt a popping in her heel and she just wanted to get it checked to make sure everything was okay.  2 days after having Achilles tendon surgery by Dr. Cannon Kettle   ROS      Objective:  Physical Exam  Neurovascular status intact negative Homans sign noted with patient's posterior heel showing well-healed surgical site with no drainage noted.  I did check Achilles tendon it appears to be intact with it being difficult to make complete determination now due to the pain that she is still experiencing     Assessment:  Probability that the Achilles tendon is intact right even though I cannot make complete determination currently     Plan:  Continue complete nonweightbearing and posterior splint with dressing reapplied to the foot and ankle and reappoint for regular visit Dr. Cannon Kettle or if issues were to occur  X-rays indicate no pathology currently with everything appearing to be normal after surgery

## 2018-08-23 ENCOUNTER — Other Ambulatory Visit: Payer: Self-pay | Admitting: Sports Medicine

## 2018-08-23 ENCOUNTER — Telehealth: Payer: Self-pay | Admitting: *Deleted

## 2018-08-23 DIAGNOSIS — G8918 Other acute postprocedural pain: Secondary | ICD-10-CM

## 2018-08-23 DIAGNOSIS — R112 Nausea with vomiting, unspecified: Secondary | ICD-10-CM

## 2018-08-23 MED ORDER — HYDROMORPHONE HCL 4 MG PO TABS: 4.0000 mg | ORAL_TABLET | ORAL | 0 refills | Status: DC | PRN

## 2018-08-23 NOTE — Telephone Encounter (Signed)
Left message informing pt Dr. March Rummage had sent the pain medication rx to [facility].

## 2018-08-23 NOTE — Telephone Encounter (Signed)
Refilled post op pain medication ?

## 2018-08-23 NOTE — Telephone Encounter (Signed)
Left message informing pt the pain medication rx had been sent to her pharmacy at 11:34am today to call the pharmacy.

## 2018-08-23 NOTE — Telephone Encounter (Signed)
Pt called to see if her pain medication had been refilled.

## 2018-08-23 NOTE — Telephone Encounter (Signed)
Pt called states she is out of her pain medication and is having burning pain, request refill.

## 2018-08-24 ENCOUNTER — Ambulatory Visit (INDEPENDENT_AMBULATORY_CARE_PROVIDER_SITE_OTHER): Payer: Self-pay | Admitting: Sports Medicine

## 2018-08-24 ENCOUNTER — Encounter: Payer: Self-pay | Admitting: Sports Medicine

## 2018-08-24 ENCOUNTER — Other Ambulatory Visit: Payer: Self-pay

## 2018-08-24 VITALS — Temp 98.0°F

## 2018-08-24 DIAGNOSIS — M7661 Achilles tendinitis, right leg: Secondary | ICD-10-CM

## 2018-08-24 DIAGNOSIS — M7731 Calcaneal spur, right foot: Secondary | ICD-10-CM

## 2018-08-24 DIAGNOSIS — G8918 Other acute postprocedural pain: Secondary | ICD-10-CM

## 2018-08-24 DIAGNOSIS — Z09 Encounter for follow-up examination after completed treatment for conditions other than malignant neoplasm: Secondary | ICD-10-CM

## 2018-08-24 DIAGNOSIS — M9261 Juvenile osteochondrosis of tarsus, right ankle: Secondary | ICD-10-CM

## 2018-08-24 DIAGNOSIS — M79673 Pain in unspecified foot: Secondary | ICD-10-CM

## 2018-08-24 MED ORDER — GABAPENTIN 300 MG PO CAPS: 300.0000 mg | ORAL_CAPSULE | Freq: Every day | ORAL | 1 refills | Status: DC

## 2018-08-24 MED ORDER — IBUPROFEN 800 MG PO TABS: 800.0000 mg | ORAL_TABLET | Freq: Three times a day (TID) | ORAL | 0 refills | Status: DC | PRN

## 2018-08-24 NOTE — Progress Notes (Signed)
Subjective: Nicole Hughes is a 48 y.o. 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  Patient admits pain at surgical site, denies calf pain, denies headache, chest pain, shortness of breath, nausea, vomiting, fever, or chills. Patient states that on Wednesday he experienced a increase in pain and could not feel a pop but is not sure because she was not doing anything that could have caused stress to the foot and ankle.  Patient reports that she is dealing with a hard time trying to make sure she keeps pressure off her foot and ankle and rest comfortably has a hard time sleeping at night and reports increase in sharp shooting burning pain.  No other issues noted.   Patient Active Problem List   Diagnosis Date Noted  . Insect bite 06/21/2018  . Pedal edema 03/31/2018  . Anemia 03/31/2018  . Appetite loss 03/08/2018  . Myofascial pain 03/08/2018  . IBS (irritable bowel syndrome) 03/08/2018  . Alcoholic hepatitis without ascites 03/05/2018  . Elevated LFTs 03/03/2018  . Achilles tendonitis 01/28/2018  . Abnormal cervical Papanicolaou smear 09/29/2017  . Insomnia 09/29/2017  . Pain 09/29/2017  . Palpitations 05/25/2017  . Chest wall pain 05/25/2017  . Elevated transaminase level 05/25/2017  . Depression with anxiety 02/23/2017  . 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 Hyponatremia 02/17/2017  . Hypokalemia 09/24/2016  . H/O alcohol abuse 09/22/2016  . H/O acute pancreatitis 09/22/2016  . GERD (gastroesophageal reflux disease) 04/09/2015  . 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 Chest pain 08/20/2012  . Stress reaction 07/23/2012  . Hypertension 06/09/2012  . History of pulmonary embolism 06/09/2012  . Preop cardiovascular exam 06/08/2012  . Dysmenorrhea   . Dyspareunia   . Pelvic pain     Current Outpatient Medications on File Prior to Visit  Medication Sig Dispense  Refill  . amLODipine (NORVASC) 10 MG tablet Take 1 tablet (10 mg total) by mouth daily. 90 tablet 3  . docusate sodium (COLACE) 100 MG capsule Take 1 capsule (100 mg total) by mouth 2 (two) times daily. 10 capsule 0  . doxycycline (VIBRAMYCIN) 100 MG capsule Take 1 capsule (100 mg total) by mouth 2 (two) times daily. 14 capsule 0  . hydrochlorothiazide (HYDRODIURIL) 25 MG tablet Take 1 tablet (25 mg total) by mouth daily as needed. 30 tablet 0  . HYDROmorphone (DILAUDID) 4 MG tablet Take 1 tablet (4 mg total) by mouth every 4 (four) hours as needed for up to 7 days for severe pain. 30 tablet 0  . hydrOXYzine (ATARAX/VISTARIL) 25 MG tablet Take 1 tablet (25 mg total) by mouth every 8 (eight) hours as needed for anxiety or nausea. 90 tablet 1  . lisinopril (PRINIVIL,ZESTRIL) 10 MG tablet Take 2 tablets (20 mg total) by mouth daily. 180 tablet 3  . metoprolol succinate (TOPROL-XL) 50 MG 24 hr tablet Take 1 tablet (50 mg total) by mouth daily. Take with or immediately following a meal. 90 tablet 3  . mirtazapine (REMERON) 15 MG tablet Take 1 tablet (15 mg total) by mouth at bedtime. 90 tablet 3  . Multiple Vitamin (MULTIVITAMIN WITH MINERALS) TABS tablet Take 1 tablet by mouth daily. 30 tablet 0  . omeprazole (PRILOSEC) 20 MG capsule Take 1 capsule (20 mg total) by mouth daily. 90 capsule 3  . ondansetron (ZOFRAN-ODT) 8 MG disintegrating tablet Take 1 tablet (8 mg total) by mouth every 8 (eight) hours  as needed for nausea. 20 tablet 0  . promethazine (PHENERGAN) 25 MG tablet TAKE 1 TABLET BY MOUTH EVERY 8 HOURS AS NEEDED FOR NAUSEA FOR VOMITING 20 tablet 0  . sertraline (ZOLOFT) 50 MG tablet Take 1 tablet (50 mg total) by mouth daily. 90 tablet 3   No current facility-administered medications on file prior to visit.     No Known Allergies  Objective: There were no vitals filed for this visit.  General: No acute distress, AAOx3  Right foot: Sutures and staples intact with no gapping or dehiscence  at surgical site, mild to moderate swelling to right heel, no erythema, no warmth, no drainage, no signs of infection noted, Capillary fill time <3 seconds in all digits, gross sensation present via light touch to right foot.  Thompson sign negative with squeezing of the calf there is an active dorsi and plantar flexion signifying that the Achilles attachment is intact, there is guarding with palpation to right foot and posterior heel.  No pain with calf compression.   Assessment and Plan:  Problem List Items Addressed This Visit    None    Visit Diagnoses    Tendonitis, Achilles, right    -  Primary   Surgery follow-up       Post-op pain       Relevant Medications   ibuprofen (ADVIL) 800 MG tablet   Inflammatory heel pain, unspecified laterality       Haglund's deformity of right heel       Heel spur, right           -Patient seen and evaluated -Previous x-rays reviewed from last Wednesday -Advised patient that all findings are consistent with normal postoperative changes -Applied dry sterile dressing and Unna boot foot secured with ACE wrap and stockinet  -Advised patient to make sure to keep dressings clean, dry, and intact to right foot -Advised patient to continue with rest ice elevation and prescribed gabapentin for any pain at bedtime that consist of sharp shooting pain -Continue with pain medication as previously was dispensed as well as Motrin in between doses for additional pain relief -Advised patient to limit activity to necessity and to remain nonweightbearing with use of scooter on right -Advised patient to ice and elevate as instructed -Will plan for Unna boot change and possible suture check versus removal at next office visit. In the meantime, patient to call office if any issues or problems arise.   Asencion Islamitorya Envi Eagleson, DPM

## 2018-08-30 ENCOUNTER — Other Ambulatory Visit: Payer: Self-pay | Admitting: Sports Medicine

## 2018-08-30 ENCOUNTER — Telehealth: Payer: Self-pay | Admitting: *Deleted

## 2018-08-30 DIAGNOSIS — G8918 Other acute postprocedural pain: Secondary | ICD-10-CM

## 2018-08-30 MED ORDER — HYDROMORPHONE HCL 4 MG PO TABS: 4.0000 mg | ORAL_TABLET | ORAL | 0 refills | Status: DC | PRN

## 2018-08-30 NOTE — Telephone Encounter (Signed)
Pt states she has an appt tomorrow with Dr. Cannon Kettle, and Dr. Cannon Kettle had wanted her to have pain medication for the appt.

## 2018-08-30 NOTE — Telephone Encounter (Signed)
Pt called to see if pain mediation had been called to the pharmacy. I left message informing pt the medication had been called in the pharmacy and she could pick it up in the evening, and if she called the pharmacy in the future to check for the refill status and not have to wait for a call from our office.

## 2018-08-30 NOTE — Progress Notes (Signed)
I Refilled her pain medication  Sent to her pharmacy -Dr. Chauncey Cruel

## 2018-08-31 ENCOUNTER — Encounter: Payer: Self-pay | Admitting: Sports Medicine

## 2018-08-31 ENCOUNTER — Other Ambulatory Visit: Payer: Self-pay

## 2018-08-31 ENCOUNTER — Ambulatory Visit (INDEPENDENT_AMBULATORY_CARE_PROVIDER_SITE_OTHER): Payer: 59 | Admitting: Sports Medicine

## 2018-08-31 DIAGNOSIS — G8918 Other acute postprocedural pain: Secondary | ICD-10-CM

## 2018-08-31 DIAGNOSIS — M7661 Achilles tendinitis, right leg: Secondary | ICD-10-CM

## 2018-08-31 DIAGNOSIS — M9261 Juvenile osteochondrosis of tarsus, right ankle: Secondary | ICD-10-CM

## 2018-08-31 DIAGNOSIS — M7731 Calcaneal spur, right foot: Secondary | ICD-10-CM

## 2018-08-31 DIAGNOSIS — Z09 Encounter for follow-up examination after completed treatment for conditions other than malignant neoplasm: Secondary | ICD-10-CM

## 2018-08-31 DIAGNOSIS — R112 Nausea with vomiting, unspecified: Secondary | ICD-10-CM

## 2018-08-31 DIAGNOSIS — M79673 Pain in unspecified foot: Secondary | ICD-10-CM

## 2018-08-31 MED ORDER — PROMETHAZINE HCL 25 MG PO TABS: ORAL_TABLET | ORAL | 0 refills | Status: DC

## 2018-08-31 NOTE — Progress Notes (Signed)
Subjective: Nicole Hughes is a 48 y.o. 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  Patient admits pain at surgical site but slowly getting better, denies calf pain, denies headache, chest pain, shortness of breath, nausea, vomiting, fever, or chills.  No other issues noted.   Patient Active Problem List   Diagnosis Date Noted  . Insect bite 06/21/2018  . Pedal edema 03/31/2018  . Anemia 03/31/2018  . Appetite loss 03/08/2018  . Myofascial pain 03/08/2018  . IBS (irritable bowel syndrome) 03/08/2018  . Alcoholic hepatitis without ascites 03/05/2018  . Elevated LFTs 03/03/2018  . Achilles tendonitis 01/28/2018  . Abnormal cervical Papanicolaou smear 09/29/2017  . Insomnia 09/29/2017  . Pain 09/29/2017  . Palpitations 05/25/2017  . Chest wall pain 05/25/2017  . Elevated transaminase level 05/25/2017  . Depression with anxiety 02/23/2017  . 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 Hyponatremia 02/17/2017  . Hypokalemia 09/24/2016  . H/O alcohol abuse 09/22/2016  . H/O acute pancreatitis 09/22/2016  . GERD (gastroesophageal reflux disease) 04/09/2015  . 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 Chest pain 08/20/2012  . Stress reaction 07/23/2012  . Hypertension 06/09/2012  . History of pulmonary embolism 06/09/2012  . Preop cardiovascular exam 06/08/2012  . Dysmenorrhea   . Dyspareunia   . Pelvic pain     Current Outpatient Medications on File Prior to Visit  Medication Sig Dispense Refill  . amLODipine (NORVASC) 10 MG tablet Take 1 tablet (10 mg total) by mouth daily. 90 tablet 3  . docusate sodium (COLACE) 100 MG capsule Take 1 capsule (100 mg total) by mouth 2 (two) times daily. 10 capsule 0  . doxycycline (VIBRAMYCIN) 100 MG capsule Take 1 capsule (100 mg total) by mouth 2 (two) times daily. 14 capsule 0  . gabapentin (NEURONTIN) 300 MG capsule Take 1 capsule  (300 mg total) by mouth at bedtime. 90 capsule 1  . hydrochlorothiazide (HYDRODIURIL) 25 MG tablet Take 1 tablet (25 mg total) by mouth daily as needed. 30 tablet 0  . HYDROmorphone (DILAUDID) 4 MG tablet Take 1 tablet (4 mg total) by mouth every 4 (four) hours as needed for up to 7 days for severe pain. 30 tablet 0  . hydrOXYzine (ATARAX/VISTARIL) 25 MG tablet Take 1 tablet (25 mg total) by mouth every 8 (eight) hours as needed for anxiety or nausea. 90 tablet 1  . ibuprofen (ADVIL) 800 MG tablet Take 1 tablet (800 mg total) by mouth every 8 (eight) hours as needed. 30 tablet 0  . lisinopril (PRINIVIL,ZESTRIL) 10 MG tablet Take 2 tablets (20 mg total) by mouth daily. 180 tablet 3  . metoprolol succinate (TOPROL-XL) 50 MG 24 hr tablet Take 1 tablet (50 mg total) by mouth daily. Take with or immediately following a meal. 90 tablet 3  . mirtazapine (REMERON) 15 MG tablet Take 1 tablet (15 mg total) by mouth at bedtime. 90 tablet 3  . Multiple Vitamin (MULTIVITAMIN WITH MINERALS) TABS tablet Take 1 tablet by mouth daily. 30 tablet 0  . omeprazole (PRILOSEC) 20 MG capsule Take 1 capsule (20 mg total) by mouth daily. 90 capsule 3  . ondansetron (ZOFRAN-ODT) 8 MG disintegrating tablet Take 1 tablet (8 mg total) by mouth every 8 (eight) hours as needed for nausea. 20 tablet 0  . sertraline (ZOLOFT) 50 MG tablet Take 1 tablet (50 mg total) by mouth daily. 90 tablet 3   No current  facility-administered medications on file prior to visit.     No Known Allergies  Objective: There were no vitals filed for this visit.  General: No acute distress, AAOx3  Right foot: Sutures and staples intact with no gapping or dehiscence at surgical site, mild to moderate swelling to right heel, no erythema, no warmth, no drainage, no signs of infection noted, Capillary fill time <3 seconds in all digits, gross sensation present via light touch to right foot.  Thompson sign negative with squeezing of the calf there is an  active dorsi and plantar flexion signifying that the Achilles attachment is intact like before, there is guarding with palpation to right foot and posterior heel.  No pain with calf compression.   Assessment and Plan:  Problem List Items Addressed This Visit    None    Visit Diagnoses    Post-op pain    -  Primary   Nausea and vomiting in adult       Relevant Medications   promethazine (PHENERGAN) 25 MG tablet   Tendonitis, Achilles, right       Surgery follow-up       Haglund's deformity of right heel       Inflammatory heel pain, unspecified laterality       Heel spur, right           -Patient seen and evaluated -Unna boot change performed -Staples left intact -Continue with pain medication as previously was dispensed as well as Motrin in between doses for additional pain relief -Continue Gabapentin -Refilled Phenergan -Advised patient to limit activity to necessity and to remain nonweightbearing with use of scooter on right -Advised patient to ice and elevate as instructed -Will plan for removing a few staples at next office visit. In the meantime, patient to call office if any issues or problems arise.  -As a courtsey trimmed Left 1st toenail for her at no charge using a sterile nail nipper without incident.  Landis Martins, DPM

## 2018-09-03 ENCOUNTER — Other Ambulatory Visit: Payer: Self-pay | Admitting: Sports Medicine

## 2018-09-03 ENCOUNTER — Telehealth: Payer: Self-pay | Admitting: *Deleted

## 2018-09-03 DIAGNOSIS — G8918 Other acute postprocedural pain: Secondary | ICD-10-CM

## 2018-09-03 MED ORDER — HYDROMORPHONE HCL 4 MG PO TABS: 4.0000 mg | ORAL_TABLET | ORAL | 0 refills | Status: DC | PRN

## 2018-09-03 NOTE — Telephone Encounter (Signed)
Sent!

## 2018-09-03 NOTE — Progress Notes (Signed)
Refilled Dilaudid patient is eligible to pick it up on 8/30 -Dr. Cannon Kettle

## 2018-09-03 NOTE — Telephone Encounter (Signed)
Patient called requesting refill of her pain medication

## 2018-09-07 ENCOUNTER — Ambulatory Visit (HOSPITAL_COMMUNITY)
Admission: RE | Admit: 2018-09-07 | Discharge: 2018-09-07 | Disposition: A | Discharge status: Home / Self Care | Payer: 59 | Source: Ambulatory Visit | Attending: Internal Medicine | Admitting: Internal Medicine

## 2018-09-07 ENCOUNTER — Ambulatory Visit (INDEPENDENT_AMBULATORY_CARE_PROVIDER_SITE_OTHER): Payer: Self-pay | Admitting: Podiatry

## 2018-09-07 ENCOUNTER — Telehealth: Payer: Self-pay

## 2018-09-07 ENCOUNTER — Telehealth: Payer: Self-pay | Admitting: *Deleted

## 2018-09-07 ENCOUNTER — Encounter (HOSPITAL_COMMUNITY): Payer: Self-pay | Admitting: Radiology

## 2018-09-07 ENCOUNTER — Other Ambulatory Visit: Payer: Self-pay

## 2018-09-07 VITALS — BP 148/88 | HR 73 | Temp 98.4°F

## 2018-09-07 DIAGNOSIS — M9261 Juvenile osteochondrosis of tarsus, right ankle: Secondary | ICD-10-CM

## 2018-09-07 DIAGNOSIS — R252 Cramp and spasm: Secondary | ICD-10-CM

## 2018-09-07 DIAGNOSIS — R609 Edema, unspecified: Secondary | ICD-10-CM | POA: Diagnosis not present

## 2018-09-07 DIAGNOSIS — M7661 Achilles tendinitis, right leg: Secondary | ICD-10-CM

## 2018-09-07 DIAGNOSIS — G8918 Other acute postprocedural pain: Secondary | ICD-10-CM

## 2018-09-07 DIAGNOSIS — Z09 Encounter for follow-up examination after completed treatment for conditions other than malignant neoplasm: Secondary | ICD-10-CM

## 2018-09-07 MED ORDER — CEPHALEXIN 500 MG PO CAPS: 500.0000 mg | ORAL_CAPSULE | Freq: Three times a day (TID) | ORAL | 0 refills | Status: DC

## 2018-09-07 NOTE — Progress Notes (Unsigned)
Right lower venous has been completed and is negative for DVT.  Preliminary results can be found under CV proc through chart review.   Livvy Spilman, RVT [facility]  

## 2018-09-07 NOTE — Telephone Encounter (Signed)
[facility] states has a 12:30pm today with 12:15pm arrival. Faxed orders to [facility]. I gave pt a copy of the order with directions to [facility].

## 2018-09-07 NOTE — Progress Notes (Signed)
Subjective: Nicole Hughes is a 48 y.o. is seen today in office s/p right Achilles tenodn repair and removal of posterior heel spur preformed on 08/16/2018 with Dr. Cannon Kettle.  She states that her pain is controlled except for the last couple days she started getting increased cramping and cold sensation to her calf as well as surgical site to the posterior heel.  He states that she does have a history of 2 pulmonary embolisms and she is currently not on any anticoagulation.  She is been nonweightbearing in the cam boot.  Denies any systemic complaints such as fevers, chills, nausea, vomiting. No calf pain, chest pain, shortness of breath.   Objective: General: No acute distress, AAOx3  DP/PT pulses palpable 2/4, CRT < 3 sec to all digits.  Protective sensation intact. Motor function intact.  Right foot: Incision is well coapted without any evidence of dehiscence and staples are intact.  There is edema to the dorsal aspect of the foot as well as to the surgical site posteriorly with faint no redness to the posterior heel.  I think his erythema is more inflammation as opposed to infection.  There is no change in pulse.  Calf is supple however she does get pain with calf compression. No pain with calf compression, swelling, warmth, erythema.   Assessment and Plan:  Status post  right posterior heel pain bilateral leg  -Treatment options discussed including all alternatives, risks, and complications -Given her clinical symptoms and history we will order venous duplex which is scheduled for 1230 today. -Check Keflex although the erythema is more prone to infection. -The left staples intact today.  Antibiotic ointment and dressing applied.  She has an Ace bandage for compression. -NWB in CAM boot. -Ice/elevation -Pain medication as needed  RTC as scheduled with Dr. Cannon Kettle or sooner if needed  Trula Slade DPM

## 2018-09-07 NOTE — Telephone Encounter (Signed)
Spoke with patient informing her of negative ultrasound (DVT) results, she is to come in as soon as possible to be put into a posterior splint

## 2018-09-08 ENCOUNTER — Ambulatory Visit (INDEPENDENT_AMBULATORY_CARE_PROVIDER_SITE_OTHER): Payer: 59

## 2018-09-08 DIAGNOSIS — Z09 Encounter for follow-up examination after completed treatment for conditions other than malignant neoplasm: Secondary | ICD-10-CM

## 2018-09-08 DIAGNOSIS — M7660 Achilles tendinitis, unspecified leg: Secondary | ICD-10-CM | POA: Diagnosis not present

## 2018-09-08 NOTE — Progress Notes (Signed)
Patient is here for application of posterior splint, splint applied and patient stated that it was comfortable.  She is to keep her current postop appointment with Dr. Cannon Kettle.

## 2018-09-10 ENCOUNTER — Other Ambulatory Visit: Payer: Self-pay | Admitting: Sports Medicine

## 2018-09-10 ENCOUNTER — Telehealth: Payer: Self-pay | Admitting: *Deleted

## 2018-09-10 DIAGNOSIS — G8918 Other acute postprocedural pain: Secondary | ICD-10-CM

## 2018-09-10 MED ORDER — HYDROMORPHONE HCL 4 MG PO TABS: 4.0000 mg | ORAL_TABLET | ORAL | 0 refills | Status: DC | PRN

## 2018-09-10 NOTE — Telephone Encounter (Signed)
I informed pt that if she could not make the appt today, I did recommend she go to the ED to be Evaluated for DVT, also she could remove the ace to allow for variance of the foot and leg position and then rewrap the ace beginning and the farthest end of the splint and rewrap looser going up the leg. I told pt I had left a message for Dr. Cannon Kettle concerning the pain medication refill.

## 2018-09-10 NOTE — Telephone Encounter (Signed)
Pt called for refill of the pain medication and states she had the splint applied 09/08/2018, and since then the pain has tripled.

## 2018-09-10 NOTE — Telephone Encounter (Signed)
Left message for pt to call our office or to go to the ED with the pain in her leg due to her history of DVT.

## 2018-09-10 NOTE — Telephone Encounter (Signed)
Sent  Thanks Dr. Chauncey Cruel

## 2018-09-10 NOTE — Telephone Encounter (Signed)
Left message instructing pt to go to the ED if she was in severe pain due to her history of DVT or call and we could get her in to see Dr. Cannon Kettle in [place] today at 2:45pm.

## 2018-09-10 NOTE — Telephone Encounter (Signed)
Pt called states she is unable to go to the [place] office today and Dr. Jacqualyn Posey had her checked for DVT and it was okay. Pt states the pain is a severe throbbing on top of the surgery pain that keeps her up.

## 2018-09-10 NOTE — Telephone Encounter (Signed)
Pt called for pain medication. 

## 2018-09-10 NOTE — Progress Notes (Signed)
Refilled pain medication -Dr. Zorana Brockwell 

## 2018-09-12 ENCOUNTER — Emergency Department (HOSPITAL_COMMUNITY): Payer: 59

## 2018-09-12 ENCOUNTER — Emergency Department (HOSPITAL_COMMUNITY)
Admission: EM | Admit: 2018-09-12 | Discharge: 2018-09-12 | Disposition: A | Discharge status: Home / Self Care | Payer: 59 | Attending: Emergency Medicine | Admitting: Emergency Medicine

## 2018-09-12 ENCOUNTER — Emergency Department (HOSPITAL_BASED_OUTPATIENT_CLINIC_OR_DEPARTMENT_OTHER): Payer: 59

## 2018-09-12 ENCOUNTER — Other Ambulatory Visit: Payer: Self-pay

## 2018-09-12 DIAGNOSIS — I10 Essential (primary) hypertension: Secondary | ICD-10-CM | POA: Insufficient documentation

## 2018-09-12 DIAGNOSIS — M79671 Pain in right foot: Secondary | ICD-10-CM | POA: Insufficient documentation

## 2018-09-12 DIAGNOSIS — M79661 Pain in right lower leg: Secondary | ICD-10-CM | POA: Insufficient documentation

## 2018-09-12 DIAGNOSIS — Z79899 Other long term (current) drug therapy: Secondary | ICD-10-CM | POA: Insufficient documentation

## 2018-09-12 DIAGNOSIS — M79609 Pain in unspecified limb: Secondary | ICD-10-CM | POA: Diagnosis not present

## 2018-09-12 DIAGNOSIS — R102 Pelvic and perineal pain: Secondary | ICD-10-CM | POA: Insufficient documentation

## 2018-09-12 LAB — COMPREHENSIVE METABOLIC PANEL
ALT: 39 U/L (ref 0–44)
AST: 39 U/L (ref 15–41)
Albumin: 3.9 g/dL (ref 3.5–5.0)
Alkaline Phosphatase: 36 U/L — ABNORMAL LOW (ref 38–126)
Anion gap: 11 (ref 5–15)
BUN: 11 mg/dL (ref 6–20)
CO2: 23 mmol/L (ref 22–32)
Calcium: 8.9 mg/dL (ref 8.9–10.3)
Chloride: 103 mmol/L (ref 98–111)
Creatinine, Ser: 0.83 mg/dL (ref 0.44–1.00)
GFR calc Af Amer: >60 mL/min (ref >60)
GFR calc non Af Amer: >60 mL/min (ref >60)
Glucose, Bld: 94 mg/dL (ref 70–99)
Potassium: 4.4 mmol/L (ref 3.5–5.1)
Sodium: 137 mmol/L (ref 135–145)
Total Bilirubin: 0.4 mg/dL (ref 0.3–1.2)
Total Protein: 6.4 g/dL — ABNORMAL LOW (ref 6.5–8.1)

## 2018-09-12 LAB — CBC WITH DIFFERENTIAL/PLATELET
Abs Immature Granulocytes: 0.01 10*3/uL (ref 0.00–0.07)
Basophils Absolute: 0.1 10*3/uL (ref 0.0–0.1)
Basophils Relative: 1 %
Eosinophils Absolute: 0.4 10*3/uL (ref 0.0–0.5)
Eosinophils Relative: 7 %
HCT: 36.3 % (ref 36.0–46.0)
Hemoglobin: 10.9 g/dL — ABNORMAL LOW (ref 12.0–15.0)
Immature Granulocytes: 0 %
Lymphocytes Relative: 36 %
Lymphs Abs: 2.1 10*3/uL (ref 0.7–4.0)
MCH: 25.3 pg — ABNORMAL LOW (ref 26.0–34.0)
MCHC: 30 g/dL (ref 30.0–36.0)
MCV: 84.4 fL (ref 80.0–100.0)
Monocytes Absolute: 0.8 10*3/uL (ref 0.1–1.0)
Monocytes Relative: 13 %
Neutro Abs: 2.6 10*3/uL (ref 1.7–7.7)
Neutrophils Relative %: 43 %
Platelets: 236 10*3/uL (ref 150–400)
RBC: 4.3 MIL/uL (ref 3.87–5.11)
RDW: 15.1 % (ref 11.5–15.5)
WBC: 6 10*3/uL (ref 4.0–10.5)
nRBC: 0.3 % — ABNORMAL HIGH (ref 0.0–0.2)

## 2018-09-12 LAB — I-STAT BETA HCG BLOOD, ED (MC, WL, AP ONLY): I-stat hCG, quantitative: <5 m[IU]/mL (ref <5)

## 2018-09-12 LAB — LACTIC ACID, PLASMA: Lactic Acid, Venous: 1 mmol/L (ref 0.5–1.9)

## 2018-09-12 MED ORDER — OXYCODONE HCL 5 MG PO TABS
5.0000 mg | ORAL_TABLET | Freq: Once | ORAL | Status: AC
  Administered 2018-09-12: 5 mg via ORAL
  Filled 2018-09-12: qty 1

## 2018-09-12 NOTE — Progress Notes (Signed)
VASCULAR LAB PRELIMINARY  PRELIMINARY  PRELIMINARY  PRELIMINARY  Right lower extremity venous duplex completed.    Preliminary report:  See CV proc for preliminary results.    Gave report to Tamsen Roers, Facundo Allemand, RVT 09/12/2018, 4:45 PM

## 2018-09-12 NOTE — ED Triage Notes (Signed)
Pt presents with Left foot pain and swelling starting last night. Pt had achilles surgery august 10th. Dr. Cannon Kettle sent pt here to rule out possible DVT and/or infection

## 2018-09-12 NOTE — Discharge Instructions (Addendum)
Continue with medications as prescribed. Follow up with your podiatrist this week.

## 2018-09-12 NOTE — ED Provider Notes (Signed)
[facility]   CSN: [id number] Arrival date & time: 09/12/18  1312     History   Chief Complaint Chief Complaint  Patient presents with  . Foot Pain    HPI Nicole Hughes is a 48 y.o. female.     48 year old female presents with complaint of pain in her right lower leg and foot.  Patient reports having surgery on her Achilles tendon on August 10 with [facility], Dr. Marylene LandStover.  Patient states she had bone spurs, her Achilles tendon was removed, bone spurs removed and Achilles tendon reattached.  Patient has followed up weekly in the office where her splint is removed, wound checked and re-bandaged.  Patient has been taking Keflex as well as 4 mg hydromorphone every 4 hours since her surgery, states this is not helping with her pain at this time.  Patient reports a searing pain in the bottom of her foot and into her calf.  Patient had a venous Doppler of her right lower leg on Tuesday last week which was negative for DVT.  Patient called the office today due to ongoing pain not controlled with pain medication and was sent to the ER to evaluate for possible DVT.  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  Patient has been nonweightbearing since her surgery, uses a knee scooter, leg has been splinted since time of surgery.  Patient states staples have not been removed as of yet due to swelling.  Reports chills and fever of 100.2 at home.  No other complaints or concerns.     Past Medical History:  Diagnosis Date  . Anxiety   . Cardiomyopathy (HCC)   . Chronic lower back pain   . Complication of anesthesia    "hard to get under"  . Cyst of ovary   . Depression    hx  . DVT complicating pregnancy 08/1998   RLE; RUE  . Dysmenorrhea   . Dyspareunia   . 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 GERD (gastroesophageal reflux disease)   . Hay fever    "fall and spring"  (02/17/2017)  . Headache    "2-3 times/.wik" (02/17/2017)  . History of chicken pox   . Hypertension   . Migraine    "monthly" (02/17/2017)  . Pelvic pain   . Pulmonary embolism (HCC) 08/1998   S/P childbirth    Patient Active Problem List   Diagnosis Date Noted  . Insect bite 06/21/2018  . Pedal edema 03/31/2018  . Anemia 03/31/2018  . Appetite loss 03/08/2018  . Myofascial pain 03/08/2018  . IBS (irritable bowel syndrome) 03/08/2018  . Alcoholic hepatitis without ascites 03/05/2018  . Elevated LFTs 03/03/2018  . Achilles tendonitis 01/28/2018  . Abnormal cervical Papanicolaou smear 09/29/2017  . Insomnia 09/29/2017  . Pain 09/29/2017  . Palpitations 05/25/2017  . Chest wall pain 05/25/2017  . Elevated transaminase level 05/25/2017  . Depression with anxiety 02/23/2017  . 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 Hyponatremia 02/17/2017  . Hypokalemia 09/24/2016  . H/O alcohol abuse 09/22/2016  . H/O acute pancreatitis 09/22/2016  . GERD (gastroesophageal reflux disease) 04/09/2015  . 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 Chest pain 08/20/2012  . Stress reaction 07/23/2012  . Hypertension 06/09/2012  . History of pulmonary embolism 06/09/2012  . Preop cardiovascular exam 06/08/2012  . Dysmenorrhea   . Dyspareunia   . Pelvic pain     Past  Surgical History:  Procedure Laterality Date  . CESAREAN SECTION  1994  . ECTOPIC PREGNANCY SURGERY  2010  . LAPAROSCOPY N/A 09/24/2012   Procedure: OPERATIVE LAPROSCOPY WITH LYSIS OF ADHESIONS;  Surgeon: Sherron Monday, MD;  Location: [facility];  Service: Gynecology;  Laterality: N/A;  . TONSILLECTOMY  1984  . TUBAL LIGATION  2010     OB History   No obstetric history on file.      Home Medications    Prior to Admission medications   Medication Sig Start Date End Date Taking? Authorizing Provider  amLODipine (NORVASC) 10 MG tablet Take 1 tablet (10 mg total) by mouth daily. 03/31/18   Tower, Audrie Gallus, MD  cephALEXin (KEFLEX) 500  MG capsule Take 1 capsule (500 mg total) by mouth 3 (three) times daily. 09/07/18   Vivi Barrack, DPM  docusate sodium (COLACE) 100 MG capsule Take 1 capsule (100 mg total) by mouth 2 (two) times daily. 08/16/18   Asencion Islam, DPM  doxycycline (VIBRAMYCIN) 100 MG capsule Take 1 capsule (100 mg total) by mouth 2 (two) times daily. 06/23/18   Emi Belfast, FNP  gabapentin (NEURONTIN) 300 MG capsule Take 1 capsule (300 mg total) by mouth at bedtime. 08/24/18   Asencion Islam, DPM  hydrochlorothiazide (HYDRODIURIL) 25 MG tablet Take 1 tablet (25 mg total) by mouth daily as needed. 03/17/18   Emi Belfast, FNP  HYDROmorphone (DILAUDID) 4 MG tablet Take 1 tablet (4 mg total) by mouth every 4 (four) hours as needed for up to 7 days for severe pain. 09/10/18 09/17/18  Asencion Islam, DPM  hydrOXYzine (ATARAX/VISTARIL) 25 MG tablet Take 1 tablet (25 mg total) by mouth every 8 (eight) hours as needed for anxiety or nausea. 01/28/18   Tower, Audrie Gallus, MD  ibuprofen (ADVIL) 800 MG tablet Take 1 tablet (800 mg total) by mouth every 8 (eight) hours as needed. 08/24/18   Asencion Islam, DPM  lisinopril (PRINIVIL,ZESTRIL) 10 MG tablet Take 2 tablets (20 mg total) by mouth daily. 03/31/18   Tower, Audrie Gallus, MD  metoprolol succinate (TOPROL-XL) 50 MG 24 hr tablet Take 1 tablet (50 mg total) by mouth daily. Take with or immediately following a meal. 03/31/18   Tower, Audrie Gallus, MD  mirtazapine (REMERON) 15 MG tablet Take 1 tablet (15 mg total) by mouth at bedtime. 03/31/18   Tower, Audrie Gallus, MD  Multiple Vitamin (MULTIVITAMIN WITH MINERALS) TABS tablet Take 1 tablet by mouth daily. 02/19/17   Barnetta Chapel, MD  omeprazole (PRILOSEC) 20 MG capsule Take 1 capsule (20 mg total) by mouth daily. 03/31/18   Tower, Audrie Gallus, MD  ondansetron (ZOFRAN-ODT) 8 MG disintegrating tablet Take 1 tablet (8 mg total) by mouth every 8 (eight) hours as needed for nausea. 06/23/18   Emi Belfast, FNP  promethazine (PHENERGAN)  25 MG tablet TAKE 1 TABLET BY MOUTH EVERY 8 HOURS AS NEEDED FOR NAUSEA FOR VOMITING 08/31/18   Asencion Islam, DPM  sertraline (ZOLOFT) 50 MG tablet Take 1 tablet (50 mg total) by mouth daily. 03/31/18   Tower, Audrie Gallus, MD    Family History Family History  Problem Relation Age of Onset  . Cancer - Lung Mother   . Heart failure Father   . Hypertension Father     Social History Social History   Tobacco Use  . Smoking status: Never Smoker  . Smokeless tobacco: Never Used  Substance Use Topics  . Alcohol use: Not Currently    Alcohol/week: 5.0  standard drinks    Types: 5 Cans of beer per week    Frequency: Never    Comment: has quit (March 2019)  . Drug use: No     Allergies   Patient has no known allergies.   Review of Systems Review of Systems  Constitutional: Positive for chills and fever.  Musculoskeletal: Positive for arthralgias and myalgias.  Skin: Positive for wound.  Allergic/Immunologic: Negative for immunocompromised state.  Neurological: Negative for weakness and numbness.  All other systems reviewed and are negative.    Physical Exam Updated Vital Signs BP 140/78   Pulse 77   Temp 98.2 F (36.8 C) (Axillary)   Resp 20   SpO2 95%   Physical Exam Vitals signs and nursing note reviewed.  Constitutional:      General: She is not in acute distress.    Appearance: She is well-developed. She is not diaphoretic.  HENT:     Head: Normocephalic and atraumatic.  Cardiovascular:     Pulses: Normal pulses.  Pulmonary:     Effort: Pulmonary effort is normal.  Musculoskeletal:        General: Tenderness present.       Feet:  Skin:    General: Skin is warm and dry.     Findings: No rash.  Neurological:     Mental Status: She is alert and oriented to person, place, and time.  Psychiatric:        Behavior: Behavior normal.      ED Treatments / Results  Labs (all labs ordered are listed, but only abnormal results are displayed) Labs Reviewed  CBC  WITH DIFFERENTIAL/PLATELET - Abnormal; Notable for the following components:      Result Value   Hemoglobin 10.9 (*)    MCH 25.3 (*)    nRBC 0.3 (*)    All other components within normal limits  COMPREHENSIVE METABOLIC PANEL - Abnormal; Notable for the following components:   Total Protein 6.4 (*)    Alkaline Phosphatase 36 (*)    All other components within normal limits  LACTIC ACID, PLASMA  LACTIC ACID, PLASMA  I-STAT BETA HCG BLOOD, ED ([facility], [facility], [facility] ONLY)    EKG None  Radiology Dg Ankle Complete Right  Result Date: 09/12/2018 CLINICAL DATA:  Continuous posterior right ankle pain and burning. Status post Achilles tendon surgery on 08/16/2018. EXAM: RIGHT ANKLE - COMPLETE 3+ VIEW COMPARISON:  Right foot radiographs dated 08/18/2018. FINDINGS: Vertically-oriented skin clips are again demonstrated posteriorly. Small amount of calcification in ill-defined bone margins involving the posterior calcaneus along the course of the Achilles tendon insertion. This area was partially obscured by overlying skin clips and fiberglass splint on the previous radiographs. No soft tissue gas or periosteal reaction. Small inferior calcaneal enthesophyte. Minimal diffuse soft tissue swelling, most pronounced laterally. No effusion seen. IMPRESSION: 1. Probable normal postoperative changes involving the posterior aspect of the calcaneus. Mild changes of osteomyelitis could also have this appearance. 2. Posterior skin clips remain in place. Electronically Signed   By: Claudie Revering M.D.   On: 09/12/2018 14:39    Procedures Procedures (including critical care time)  Medications Ordered in ED Medications  oxyCODONE (Oxy IR/ROXICODONE) immediate release tablet 5 mg (has no administration in time range)     Initial Impression / Assessment and Plan / ED Course  I have reviewed the triage vital signs and the nursing notes.  Pertinent labs & imaging results that were available during my care of the patient were  reviewed  by me and considered in my medical decision making (see chart for details).  Clinical Course as of Sep 11 1532  Wynelle LinkSun Sep 12, 2018  1530 48yo female with pain in the right foot and calf, 1 month post op surgery for bone spurs. Reports temp 100.2 at home, is afebrile throughout ER visit. WBC normal, CMP without significant changes. XR with post op changes, consider osteomyelitis however with labs otherwise normal recommend recheck with podiatry this week. Venous doppler negative for DVT. Patient will be given 1 time dose of oxycodone with plan to continue on her prior pain meds. Case discussed with Dr. Jeraldine LootsLockwood, ER attending, agrees with plan of care. Patient feels reassured with results today and will follow up with her podiatrist.    [LM]    Clinical Course User Index [LM] Jeannie FendMurphy, Laura A, PA-C      Final Clinical Impressions(s) / ED Diagnoses   Final diagnoses:  Foot pain, right    ED Discharge Orders    None       Alden HippMurphy, Laura A, PA-C 09/12/18 1534    Gerhard MunchLockwood, Robert, MD 09/13/18 [phone]

## 2018-09-12 NOTE — ED Notes (Signed)
Patient verbalizes understanding of discharge instructions. Opportunity for questioning and answers were provided. Armband removed by staff, pt discharged from ED.  

## 2018-09-14 ENCOUNTER — Ambulatory Visit (INDEPENDENT_AMBULATORY_CARE_PROVIDER_SITE_OTHER): Payer: 59

## 2018-09-14 ENCOUNTER — Encounter: Payer: Self-pay | Admitting: Sports Medicine

## 2018-09-14 ENCOUNTER — Other Ambulatory Visit: Payer: Self-pay

## 2018-09-14 ENCOUNTER — Ambulatory Visit (INDEPENDENT_AMBULATORY_CARE_PROVIDER_SITE_OTHER): Payer: 59 | Admitting: Sports Medicine

## 2018-09-14 DIAGNOSIS — M7661 Achilles tendinitis, right leg: Secondary | ICD-10-CM

## 2018-09-14 DIAGNOSIS — Z09 Encounter for follow-up examination after completed treatment for conditions other than malignant neoplasm: Secondary | ICD-10-CM

## 2018-09-14 DIAGNOSIS — M792 Neuralgia and neuritis, unspecified: Secondary | ICD-10-CM

## 2018-09-14 DIAGNOSIS — M7660 Achilles tendinitis, unspecified leg: Secondary | ICD-10-CM

## 2018-09-14 DIAGNOSIS — G8918 Other acute postprocedural pain: Secondary | ICD-10-CM

## 2018-09-14 DIAGNOSIS — R252 Cramp and spasm: Secondary | ICD-10-CM

## 2018-09-14 MED ORDER — CYCLOBENZAPRINE HCL 10 MG PO TABS: 10.0000 mg | ORAL_TABLET | Freq: Every day | ORAL | 0 refills | Status: DC

## 2018-09-14 MED ORDER — GABAPENTIN 300 MG PO CAPS: 300.0000 mg | ORAL_CAPSULE | Freq: Four times a day (QID) | ORAL | 3 refills | Status: DC

## 2018-09-14 MED ORDER — HYDROMORPHONE HCL 4 MG PO TABS: 4.0000 mg | ORAL_TABLET | ORAL | 0 refills | Status: DC | PRN

## 2018-09-14 NOTE — Progress Notes (Signed)
Subjective: Nicole Hughes is a 48 y.o. 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  Patient admits severe pain at surgical site since Thursday feeling like heel is going to burst with swelling and warmth and redness, Went to ER on 9-6 they Rx her antibititcs and told her to follow up in office, Cant sleep at night, sharp cramping pain, feels like hot coals, denies calf pain, denies headache, chest pain, shortness of breath, nausea, vomiting, fever, or chills.  No other issues noted.   Patient Active Problem List   Diagnosis Date Noted  . Insect bite 06/21/2018  . Pedal edema 03/31/2018  . Anemia 03/31/2018  . Appetite loss 03/08/2018  . Myofascial pain 03/08/2018  . IBS (irritable bowel syndrome) 03/08/2018  . Alcoholic hepatitis without ascites 03/05/2018  . Elevated LFTs 03/03/2018  . Achilles tendonitis 01/28/2018  . Abnormal cervical Papanicolaou smear 09/29/2017  . Insomnia 09/29/2017  . Pain 09/29/2017  . Palpitations 05/25/2017  . Chest wall pain 05/25/2017  . Elevated transaminase level 05/25/2017  . Depression with anxiety 02/23/2017  . 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 Hyponatremia 02/17/2017  . Hypokalemia 09/24/2016  . H/O alcohol abuse 09/22/2016  . H/O acute pancreatitis 09/22/2016  . GERD (gastroesophageal reflux disease) 04/09/2015  . 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 Chest pain 08/20/2012  . Stress reaction 07/23/2012  . Hypertension 06/09/2012  . History of pulmonary embolism 06/09/2012  . Preop cardiovascular exam 06/08/2012  . Dysmenorrhea   . Dyspareunia   . Pelvic pain     Current Outpatient Medications on File Prior to Visit  Medication Sig Dispense Refill  . amLODipine (NORVASC) 10 MG tablet Take 1 tablet (10 mg total) by mouth daily. 90 tablet 3  . cephALEXin (KEFLEX) 500 MG capsule Take 1 capsule (500 mg total) by mouth 3  (three) times daily. 21 capsule 0  . docusate sodium (COLACE) 100 MG capsule Take 1 capsule (100 mg total) by mouth 2 (two) times daily. (Patient taking differently: Take 100 mg by mouth 2 (two) times daily as needed (constipation). ) 10 capsule 0  . hydrochlorothiazide (HYDRODIURIL) 25 MG tablet Take 1 tablet (25 mg total) by mouth daily as needed. 30 tablet 0  . hydrOXYzine (ATARAX/VISTARIL) 25 MG tablet Take 1 tablet (25 mg total) by mouth every 8 (eight) hours as needed for anxiety or nausea. 90 tablet 1  . ibuprofen (ADVIL) 800 MG tablet Take 1 tablet (800 mg total) by mouth every 8 (eight) hours as needed. (Patient taking differently: Take 800 mg by mouth every 8 (eight) hours as needed (pain). ) 30 tablet 0  . lisinopril (PRINIVIL,ZESTRIL) 10 MG tablet Take 2 tablets (20 mg total) by mouth daily. (Patient taking differently: Take 10 mg by mouth daily. ) 180 tablet 3  . metoprolol succinate (TOPROL-XL) 50 MG 24 hr tablet Take 1 tablet (50 mg total) by mouth daily. Take with or immediately following a meal. 90 tablet 3  . mirtazapine (REMERON) 15 MG tablet Take 1 tablet (15 mg total) by mouth at bedtime. (Patient taking differently: Take 15 mg by mouth at bedtime as needed. ) 90 tablet 3  . Multiple Vitamin (MULTIVITAMIN WITH MINERALS) TABS tablet Take 1 tablet by mouth daily. 30 tablet 0  . omeprazole (PRILOSEC) 20 MG capsule Take 1 capsule (20 mg total) by mouth daily. 90 capsule 3  . ondansetron (ZOFRAN-ODT) 8 MG disintegrating tablet  Take 1 tablet (8 mg total) by mouth every 8 (eight) hours as needed for nausea. 20 tablet 0  . promethazine (PHENERGAN) 25 MG tablet TAKE 1 TABLET BY MOUTH EVERY 8 HOURS AS NEEDED FOR NAUSEA FOR VOMITING (Patient taking differently: Take 25 mg by mouth every 8 (eight) hours as needed for nausea or vomiting. ) 20 tablet 0  . sertraline (ZOLOFT) 50 MG tablet Take 1 tablet (50 mg total) by mouth daily. (Patient taking differently: Take 50 mg by mouth at bedtime. ) 90  tablet 3   No current facility-administered medications on file prior to visit.     No Known Allergies  Objective: There were no vitals filed for this visit.  General: No acute distress, AAOx3  Right foot: Sutures and staples intact with no gapping or dehiscence at surgical site, mild to moderate swelling to right heel, mild focal erythema, no warmth, no drainage, no other signs of infection noted, Capillary fill time <3 seconds in all digits, gross sensation present via light touch to right foot.  Thompson sign negative with squeezing of the calf there is an active dorsi and plantar flexion signifying that the Achilles attachment is intact like before, there is guarding with palpation to right foot and posterior heel.  No pain with calf compression.   Xrays normal for post op status, anchor shadow at calcaneus  Assessment and Plan:  Problem List Items Addressed This Visit      Musculoskeletal and Integument   Achilles tendonitis - Primary   Relevant Orders   DG Foot Complete Right (Completed)    Other Visit Diagnoses    Post-op pain       Relevant Medications   HYDROmorphone (DILAUDID) 4 MG tablet   Surgery follow-up       Leg cramping       Neuritis           -Patient seen and evaluated -Unna boot applied -Staples left intact will take out next week if patinet pain is better -Continue with pain medication as previously was dispensed as well as Motrin in between doses for additional pain relief -Continue Gabapentin increase to QID and Rx Flexiril at bedtime for spasms  -Continue with PO antibiotics until completed  -Advised patient to limit activity to necessity and to remain nonweightbearing with use of scooter on right -Advised patient to ice and elevate as instructed -Will plan for staples at next office visit. In the meantime, patient to call office if any issues or problems arise.    Landis Martins, DPM

## 2018-09-20 ENCOUNTER — Other Ambulatory Visit: Payer: Self-pay | Admitting: Sports Medicine

## 2018-09-20 ENCOUNTER — Telehealth: Payer: Self-pay | Admitting: *Deleted

## 2018-09-20 DIAGNOSIS — R112 Nausea with vomiting, unspecified: Secondary | ICD-10-CM

## 2018-09-20 DIAGNOSIS — G8918 Other acute postprocedural pain: Secondary | ICD-10-CM

## 2018-09-20 MED ORDER — HYDROMORPHONE HCL 4 MG PO TABS: 4.0000 mg | ORAL_TABLET | ORAL | 0 refills | Status: DC | PRN

## 2018-09-20 MED ORDER — IBUPROFEN 800 MG PO TABS: 800.0000 mg | ORAL_TABLET | Freq: Three times a day (TID) | ORAL | 0 refills | Status: DC | PRN

## 2018-09-20 MED ORDER — PROMETHAZINE HCL 25 MG PO TABS: 25.0000 mg | ORAL_TABLET | Freq: Three times a day (TID) | ORAL | 0 refills | Status: DC | PRN

## 2018-09-20 NOTE — Telephone Encounter (Signed)
Pt called states the muscle relaxer knocks her out for a while, but "basically this foot feels like it is going to explode". I told pt that Dr. Cannon Kettle was in surgery today, but often reviewed messages, and for her to check with her pharmacy later today. Pt states understanding.

## 2018-09-20 NOTE — Telephone Encounter (Signed)
Pt called twice requesting pain medication and nausea medication.

## 2018-09-20 NOTE — Telephone Encounter (Signed)
Left message informing pt Dr. Cannon Kettle had asked if she is totally out of the medication if so she can refill one day early, but should use the muscle relaxant and gabapentin to help and rest, ice and elevation, and to call.

## 2018-09-20 NOTE — Telephone Encounter (Signed)
Left message informing pt the pain medication and nausea medication had been called and as well as motrin for pain and inflammation.

## 2018-09-20 NOTE — Progress Notes (Signed)
I also refilled motrin for patient to take in between doses of the pain medication to help with swelling and inflammation to heel. -Dr. Cannon Kettle

## 2018-09-20 NOTE — Telephone Encounter (Signed)
-----   Message from Landis Martins, [place] sent at 09/20/2018  2:51 PM EDT ----- Juluis Rainier I sent Motrin to her pharmacy as well to help with inflammation and pain to heel -Dr. Cannon Kettle

## 2018-09-20 NOTE — Telephone Encounter (Signed)
Left message informing pt I needed to know the status of the discomfort to inform Dr. Cannon Kettle, I had reviewed her pain medication and the Dilaudid was not due to be refilled until 09/21/2018 and Dr. Cannon Kettle would need to know her current status to decide if the medication was to be refill earlier.

## 2018-09-20 NOTE — Telephone Encounter (Signed)
Is she all out of pain medication? If so I can refill it this one time 1 day earlier. Meanwhile she should suppliment with her muscle relaxant or Gabapentin to help and continue with rest, ice, and elevation of the extremity  -Dr. Cannon Kettle

## 2018-09-20 NOTE — Progress Notes (Signed)
Refilled Dilaudid. 1 day early due to patient having an increase episode of pain -Dr. Cannon Kettle

## 2018-09-21 ENCOUNTER — Other Ambulatory Visit: Payer: Self-pay

## 2018-09-21 ENCOUNTER — Ambulatory Visit (INDEPENDENT_AMBULATORY_CARE_PROVIDER_SITE_OTHER): Payer: Self-pay | Admitting: Sports Medicine

## 2018-09-21 ENCOUNTER — Encounter: Payer: Self-pay | Admitting: Sports Medicine

## 2018-09-21 DIAGNOSIS — G8918 Other acute postprocedural pain: Secondary | ICD-10-CM

## 2018-09-21 DIAGNOSIS — M792 Neuralgia and neuritis, unspecified: Secondary | ICD-10-CM

## 2018-09-21 DIAGNOSIS — M7660 Achilles tendinitis, unspecified leg: Secondary | ICD-10-CM

## 2018-09-21 DIAGNOSIS — R252 Cramp and spasm: Secondary | ICD-10-CM

## 2018-09-21 DIAGNOSIS — Z09 Encounter for follow-up examination after completed treatment for conditions other than malignant neoplasm: Secondary | ICD-10-CM

## 2018-09-21 NOTE — Progress Notes (Signed)
Subjective: Nicole Hughes is a 48 y.o. 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  Patient admits pain at surgical site that is slowly getting better since adding on the Motrin every 8 hours reports that she still has episodes when she is up scooting around on her scooter too much that the heel feels like it is going to swell with a burning sensation at the incision site and feeling like the back of her heel is going to burst, denies calf pain, denies headache, chest pain, shortness of breath, nausea, vomiting, fever, or chills.  No other issues noted.   Patient Active Problem List   Diagnosis Date Noted  . Insect bite 06/21/2018  . Pedal edema 03/31/2018  . Anemia 03/31/2018  . Appetite loss 03/08/2018  . Myofascial pain 03/08/2018  . IBS (irritable bowel syndrome) 03/08/2018  . Alcoholic hepatitis without ascites 03/05/2018  . Elevated LFTs 03/03/2018  . Achilles tendonitis 01/28/2018  . Abnormal cervical Papanicolaou smear 09/29/2017  . Insomnia 09/29/2017  . Pain 09/29/2017  . Palpitations 05/25/2017  . Chest wall pain 05/25/2017  . Elevated transaminase level 05/25/2017  . Depression with anxiety 02/23/2017  . 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 Hyponatremia 02/17/2017  . Hypokalemia 09/24/2016  . H/O alcohol abuse 09/22/2016  . H/O acute pancreatitis 09/22/2016  . GERD (gastroesophageal reflux disease) 04/09/2015  . 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 Chest pain 08/20/2012  . Stress reaction 07/23/2012  . Hypertension 06/09/2012  . History of pulmonary embolism 06/09/2012  . Preop cardiovascular exam 06/08/2012  . Dysmenorrhea   . Dyspareunia   . Pelvic pain     Current Outpatient Medications on File Prior to Visit  Medication Sig Dispense Refill  . amLODipine (NORVASC) 10 MG tablet Take 1 tablet (10 mg total) by mouth daily. 90 tablet 3  .  cyclobenzaprine (FLEXERIL) 10 MG tablet Take 1 tablet (10 mg total) by mouth at bedtime. DO NOT MIX WITH PAIN MEDICATION 30 tablet 0  . docusate sodium (COLACE) 100 MG capsule Take 1 capsule (100 mg total) by mouth 2 (two) times daily. (Patient taking differently: Take 100 mg by mouth 2 (two) times daily as needed (constipation). ) 10 capsule 0  . gabapentin (NEURONTIN) 300 MG capsule Take 1 capsule (300 mg total) by mouth 4 (four) times daily. 90 capsule 3  . hydrochlorothiazide (HYDRODIURIL) 25 MG tablet Take 1 tablet (25 mg total) by mouth daily as needed. 30 tablet 0  . HYDROmorphone (DILAUDID) 4 MG tablet Take 1 tablet (4 mg total) by mouth every 4 (four) hours as needed for up to 7 days for severe pain. 30 tablet 0  . hydrOXYzine (ATARAX/VISTARIL) 25 MG tablet Take 1 tablet (25 mg total) by mouth every 8 (eight) hours as needed for anxiety or nausea. 90 tablet 1  . ibuprofen (ADVIL) 800 MG tablet Take 1 tablet (800 mg total) by mouth every 8 (eight) hours as needed. 30 tablet 0  . lisinopril (PRINIVIL,ZESTRIL) 10 MG tablet Take 2 tablets (20 mg total) by mouth daily. (Patient taking differently: Take 10 mg by mouth daily. ) 180 tablet 3  . metoprolol succinate (TOPROL-XL) 50 MG 24 hr tablet Take 1 tablet (50 mg total) by mouth daily. Take with or immediately following a meal. 90 tablet 3  . mirtazapine (REMERON) 15 MG tablet Take 1 tablet (15 mg total) by mouth at bedtime. (Patient taking  differently: Take 15 mg by mouth at bedtime as needed. ) 90 tablet 3  . Multiple Vitamin (MULTIVITAMIN WITH MINERALS) TABS tablet Take 1 tablet by mouth daily. 30 tablet 0  . omeprazole (PRILOSEC) 20 MG capsule Take 1 capsule (20 mg total) by mouth daily. 90 capsule 3  . ondansetron (ZOFRAN-ODT) 8 MG disintegrating tablet Take 1 tablet (8 mg total) by mouth every 8 (eight) hours as needed for nausea. 20 tablet 0  . promethazine (PHENERGAN) 25 MG tablet Take 1 tablet (25 mg total) by mouth every 8 (eight) hours as  needed for nausea or vomiting. 20 tablet 0  . sertraline (ZOLOFT) 50 MG tablet Take 1 tablet (50 mg total) by mouth daily. (Patient taking differently: Take 50 mg by mouth at bedtime. ) 90 tablet 3   No current facility-administered medications on file prior to visit.     No Known Allergies  Objective: There were no vitals filed for this visit.  General: No acute distress, AAOx3  Right foot: Sutures and staples intact with no gapping or dehiscence at surgical site, mild swelling to right heel, mild focal erythema, no warmth, no drainage, no other signs of infection noted, Capillary fill time <3 seconds in all digits, gross sensation present via light touch to right foot.  Thompson sign negative with squeezing of the calf there is an active dorsi and plantar flexion signifying that the Achilles attachment is intact like before, there is guarding with palpation to right foot and posterior heel but less than before.  No pain with calf compression.   Assessment and Plan:  Problem List Items Addressed This Visit      Musculoskeletal and Integument   Achilles tendonitis    Other Visit Diagnoses    Surgery follow-up    -  Primary   Neuritis       Leg cramping       Post-op pain          -Patient seen and evaluated -Every other staple was removed -Unna boot applied -Continue with pain medication as previously was dispensed as well as Motrin in between doses for additional pain relief -Continue Gabapentin increase to QID and Rx Flexiril at bedtime for spasms  -Oral antibiotics to be completed this week -Advised patient to limit activity to necessity and to remain nonweightbearing with use of scooter on right -Advised patient to ice and elevate as instructed -Will plan for possibly finishing her staples and allowing partial weightbearing with cam boot at next office visit. In the meantime, patient to call office if any issues or problems arise.    Asencion Islam, DPM

## 2018-09-24 ENCOUNTER — Other Ambulatory Visit: Payer: Self-pay | Admitting: Sports Medicine

## 2018-09-24 ENCOUNTER — Telehealth: Payer: Self-pay | Admitting: *Deleted

## 2018-09-24 DIAGNOSIS — G8918 Other acute postprocedural pain: Secondary | ICD-10-CM

## 2018-09-24 MED ORDER — HYDROMORPHONE HCL 4 MG PO TABS: 4.0000 mg | ORAL_TABLET | ORAL | 0 refills | Status: DC | PRN

## 2018-09-24 NOTE — Telephone Encounter (Signed)
Pt states her pain medication will need to be refilled tomorrow.

## 2018-09-24 NOTE — Progress Notes (Signed)
Post-op pain medication refilled.

## 2018-09-24 NOTE — Telephone Encounter (Signed)
Refill sent.

## 2018-09-28 ENCOUNTER — Other Ambulatory Visit: Payer: Self-pay

## 2018-09-28 ENCOUNTER — Ambulatory Visit (INDEPENDENT_AMBULATORY_CARE_PROVIDER_SITE_OTHER): Payer: 59 | Admitting: Podiatry

## 2018-09-28 DIAGNOSIS — Z09 Encounter for follow-up examination after completed treatment for conditions other than malignant neoplasm: Secondary | ICD-10-CM

## 2018-09-28 DIAGNOSIS — M7660 Achilles tendinitis, unspecified leg: Secondary | ICD-10-CM

## 2018-09-28 NOTE — Progress Notes (Signed)
Subjective: Nicole Hughes is a 48 y.o. is seen today in office s/p right Achilles tendon repair with removal of posterior heel spur performed on August 16, 2018 with Dr. Cannon Kettle.  She states that she still having pain but overall it is improving.  She presents today for suture, staple removal.  Denies any systemic complaints such as fevers, chills, nausea, vomiting. No calf pain, chest pain, shortness of breath.   Objective: General: No acute distress, AAOx3  DP/PT pulses palpable 2/4, CRT < 3 sec to all digits.  Protective sensation intact. Motor function intact.  Right foot: Incision is well coapted without any evidence of dehiscence with sutures, staples intact. There is no surrounding erythema, ascending cellulitis, fluctuance, crepitus, malodor, drainage/purulence. There is mild edema around the surgical site. There is mild pain along the surgical site.  Thompson's is negative.  Overall appears to be improved compared to when I last saw her. No other areas of tenderness to bilateral lower extremities.  No other open lesions or pre-ulcerative lesions.  No pain with calf compression, swelling, warmth, erythema.   Assessment and Plan:  Status post right foot surgery, doing well with no complications   -Treatment options discussed including all alternatives, risks, and complications -Sutures, staples were removed today.  Incision was dressed followed by Louretta Parma boot.  Precautions were advised which remove this and the one is now.  She is to continue nonweightbearing -Ice/elevation -Pain medication as needed. -Monitor for any clinical signs or symptoms of infection and DVT/PE and directed to call the office immediately should any occur or go to the ER. -Follow-up as scheduled with Dr. Cannon Kettle or sooner if any problems arise. In the meantime, encouraged to call the office with any questions, concerns, change in symptoms.   Celesta Gentile, DPM

## 2018-09-29 ENCOUNTER — Other Ambulatory Visit: Payer: Self-pay | Admitting: Sports Medicine

## 2018-09-29 ENCOUNTER — Telehealth: Payer: Self-pay | Admitting: *Deleted

## 2018-09-29 ENCOUNTER — Ambulatory Visit: Payer: Self-pay | Admitting: *Deleted

## 2018-09-29 DIAGNOSIS — G8918 Other acute postprocedural pain: Secondary | ICD-10-CM

## 2018-09-29 MED ORDER — HYDROMORPHONE HCL 4 MG PO TABS: 4.0000 mg | ORAL_TABLET | ORAL | 0 refills | Status: DC | PRN

## 2018-09-29 NOTE — Telephone Encounter (Signed)
I tried to call pt since pain level at night is a 10 but was unable to reach pt by phone. Pt has 30' appt with Dr Glori Bickers on 10/01/18 at 8 AM which was scheduled by [facility]; appears from [facility] note due to transportation issues Ludwig Clarks was first appt pt could make and ED precautions given.FYI to Dr Glori Bickers.

## 2018-09-29 NOTE — Progress Notes (Signed)
Post op pain med Rx refilled. -Dr. Cannon Kettle

## 2018-09-29 NOTE — Telephone Encounter (Signed)
Pt is calling to get pain medication refilled.

## 2018-09-29 NOTE — Telephone Encounter (Addendum)
Patient reports she has numbness in hand- R - for 1 week. Patient reports she also has pain at night- patient states hand feels like it it is going to bust. (no swelling) Patient states she is having pain in her elbow and shoulder for 2 days. Patient has had recent surgery on her foot and she has been using a scooter to get around. Per protocol- patient should be seen in 24 hours- but patient states she can only come in on Friday due to transportation. (She is not driving now and relies on others) Patient has been scheduled- and advised if her symptoms get worse she needs to be seen at ED- she agrees.  Note sent to office for review.  Patient denies: fever, cough, respiratory symptom, diarrhea. Patient has not been exposed to [place]. Patient was tested before surgery and was negative.  Reason for Disposition . Numbness (i.e., loss of sensation) in hand or fingers (Exception: just tingling; numbness present > 2 weeks)  Answer Assessment - Initial Assessment Questions 1. ONSET: "When did the pain start?"     1 week 2. LOCATION: "Where is the pain located?"     R hand, elbow and shoulder over the past couple days 3. PAIN: "How bad is the pain?" (Scale 1-10; or mild, moderate, severe)   - MILD (1-3): doesn't interfere with normal activities   - MODERATE (4-7): interferes with normal activities (e.g., work or school) or awakens from sleep   - SEVERE (8-10): excruciating pain, unable to use hand at all     At night- 10  During the day-4 4. WORK OR EXERCISE: "Has there been any recent work or exercise that involved this part of the body?"     Patient has been suing scooter since 8/11 due to foot surgery 5. CAUSE: "What do you think is causing the pain?"     Pinched nerve 6. AGGRAVATING FACTORS: "What makes the pain worse?" (e.g., using computer)     Not really 7. OTHER SYMPTOMS: "Do you have any other symptoms?" (e.g., neck pain, swelling, rash, numbness, fever)     no 8. PREGNANCY: "Is there any  chance you are pregnant?" "When was your last menstrual period?"     n/a  Protocols used: HAND AND WRIST PAIN-A-AH

## 2018-10-01 ENCOUNTER — Ambulatory Visit: Payer: 59 | Admitting: Family Medicine

## 2018-10-01 ENCOUNTER — Telehealth: Payer: Self-pay

## 2018-10-01 NOTE — Telephone Encounter (Signed)
I left message on patient's voice mail to call back and rescheduled today's appointment and I cancelled her appointment for today.

## 2018-10-01 NOTE — Telephone Encounter (Signed)
Aware, appt cancelled

## 2018-10-01 NOTE — Telephone Encounter (Signed)
Nicole Hughes - Client Nonclinical Telephone Record AccessNurse Client Nicole Hughes - Client Client Site [place] Physician Nicole Hughes - MD Contact Type Call Who Is Calling Patient / Member / Family / Caregiver Caller Name [place] Phone Number [phone] Patient Name Nicole Hughes Patient DOB [birth year] Call Type Message Only Information Provided Reason for Call Request for General Office Information Initial Comment Caller has an appt at 8, her ride is not ther. will not make it to appt Additional Comment Call Closed By: Felecia Jan Transaction Date/Time: 10/01/2018 7:51:37 AM (ET)

## 2018-10-05 ENCOUNTER — Telehealth: Payer: Self-pay | Admitting: *Deleted

## 2018-10-05 ENCOUNTER — Ambulatory Visit (INDEPENDENT_AMBULATORY_CARE_PROVIDER_SITE_OTHER): Payer: 59

## 2018-10-05 ENCOUNTER — Encounter: Payer: Self-pay | Admitting: Sports Medicine

## 2018-10-05 ENCOUNTER — Ambulatory Visit (INDEPENDENT_AMBULATORY_CARE_PROVIDER_SITE_OTHER): Payer: 59 | Admitting: Sports Medicine

## 2018-10-05 ENCOUNTER — Other Ambulatory Visit: Payer: Self-pay

## 2018-10-05 DIAGNOSIS — R112 Nausea with vomiting, unspecified: Secondary | ICD-10-CM

## 2018-10-05 DIAGNOSIS — Z09 Encounter for follow-up examination after completed treatment for conditions other than malignant neoplasm: Secondary | ICD-10-CM

## 2018-10-05 DIAGNOSIS — M7660 Achilles tendinitis, unspecified leg: Secondary | ICD-10-CM

## 2018-10-05 DIAGNOSIS — M7661 Achilles tendinitis, right leg: Secondary | ICD-10-CM

## 2018-10-05 DIAGNOSIS — M792 Neuralgia and neuritis, unspecified: Secondary | ICD-10-CM

## 2018-10-05 DIAGNOSIS — G8918 Other acute postprocedural pain: Secondary | ICD-10-CM

## 2018-10-05 MED ORDER — HYDROMORPHONE HCL 4 MG PO TABS: 4.0000 mg | ORAL_TABLET | ORAL | 0 refills | Status: DC | PRN

## 2018-10-05 MED ORDER — PROMETHAZINE HCL 25 MG PO TABS: 25.0000 mg | ORAL_TABLET | Freq: Three times a day (TID) | ORAL | 0 refills | Status: DC | PRN

## 2018-10-05 NOTE — Progress Notes (Signed)
Subjective: Nicole Hughes is a 48 y.o. 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  Patient admits pain at surgical site that is getting better. Reports that she will go for her hystrectomy soon. No other issues noted.   Patient Active Problem List   Diagnosis Date Noted  . Insect bite 06/21/2018  . Pedal edema 03/31/2018  . Anemia 03/31/2018  . Appetite loss 03/08/2018  . Myofascial pain 03/08/2018  . IBS (irritable bowel syndrome) 03/08/2018  . Alcoholic hepatitis without ascites 03/05/2018  . Elevated LFTs 03/03/2018  . Achilles tendonitis 01/28/2018  . Abnormal cervical Papanicolaou smear 09/29/2017  . Insomnia 09/29/2017  . Pain 09/29/2017  . Palpitations 05/25/2017  . Chest wall pain 05/25/2017  . Elevated transaminase level 05/25/2017  . Depression with anxiety 02/23/2017  . 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 Hyponatremia 02/17/2017  . Hypokalemia 09/24/2016  . H/O alcohol abuse 09/22/2016  . H/O acute pancreatitis 09/22/2016  . GERD (gastroesophageal reflux disease) 04/09/2015  . 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 Chest pain 08/20/2012  . Stress reaction 07/23/2012  . Hypertension 06/09/2012  . History of pulmonary embolism 06/09/2012  . Preop cardiovascular exam 06/08/2012  . Dysmenorrhea   . Dyspareunia   . Pelvic pain     Current Outpatient Medications on File Prior to Visit  Medication Sig Dispense Refill  . amLODipine (NORVASC) 10 MG tablet Take 1 tablet (10 mg total) by mouth daily. 90 tablet 3  . cyclobenzaprine (FLEXERIL) 10 MG tablet Take 1 tablet (10 mg total) by mouth at bedtime. DO NOT MIX WITH PAIN MEDICATION 30 tablet 0  . docusate sodium (COLACE) 100 MG capsule Take 1 capsule (100 mg total) by mouth 2 (two) times daily. (Patient taking differently: Take 100 mg by mouth 2 (two) times daily as needed (constipation). ) 10 capsule 0   . gabapentin (NEURONTIN) 300 MG capsule Take 1 capsule (300 mg total) by mouth 4 (four) times daily. 90 capsule 3  . hydrochlorothiazide (HYDRODIURIL) 25 MG tablet Take 1 tablet (25 mg total) by mouth daily as needed. 30 tablet 0  . hydrOXYzine (ATARAX/VISTARIL) 25 MG tablet Take 1 tablet (25 mg total) by mouth every 8 (eight) hours as needed for anxiety or nausea. 90 tablet 1  . ibuprofen (ADVIL) 800 MG tablet Take 1 tablet (800 mg total) by mouth every 8 (eight) hours as needed. 30 tablet 0  . lisinopril (PRINIVIL,ZESTRIL) 10 MG tablet Take 2 tablets (20 mg total) by mouth daily. (Patient taking differently: Take 10 mg by mouth daily. ) 180 tablet 3  . metoprolol succinate (TOPROL-XL) 50 MG 24 hr tablet Take 1 tablet (50 mg total) by mouth daily. Take with or immediately following a meal. 90 tablet 3  . mirtazapine (REMERON) 15 MG tablet Take 1 tablet (15 mg total) by mouth at bedtime. (Patient taking differently: Take 15 mg by mouth at bedtime as needed. ) 90 tablet 3  . Multiple Vitamin (MULTIVITAMIN WITH MINERALS) TABS tablet Take 1 tablet by mouth daily. 30 tablet 0  . omeprazole (PRILOSEC) 20 MG capsule Take 1 capsule (20 mg total) by mouth daily. 90 capsule 3  . ondansetron (ZOFRAN-ODT) 8 MG disintegrating tablet Take 1 tablet (8 mg total) by mouth every 8 (eight) hours as needed for nausea. 20 tablet 0  . sertraline (ZOLOFT) 50 MG tablet Take 1 tablet (50 mg total) by mouth daily. (Patient  taking differently: Take 50 mg by mouth at bedtime. ) 90 tablet 3   No current facility-administered medications on file prior to visit.     No Known Allergies  Objective: There were no vitals filed for this visit.  General: No acute distress, AAOx3  Right foot: Incision site scabbed over and intact with no gapping or dehiscence at surgical site, mild swelling to right heel, mild focal erythema, no warmth, no drainage, no other signs of infection noted, Capillary fill time <3 seconds in all digits,  gross sensation present via light touch to right foot.  Thompson sign negative.  No pain with calf compression.   Assessment and Plan:  Problem List Items Addressed This Visit      Musculoskeletal and Integument   Achilles tendonitis - Primary   Relevant Orders   DG Foot Complete Right    Other Visit Diagnoses    Surgery follow-up       Post-op pain       Relevant Medications   HYDROmorphone (DILAUDID) 4 MG tablet   Neuritis       Nausea and vomiting in adult       Relevant Medications   promethazine (PHENERGAN) 25 MG tablet      -Patient seen and evaluated -Surgitube compression sleeve applied to right  -Continue with pain medication as dispensed as well as Motrin in between doses for additional pain relief -Continue Gabapentin as needed -Refilled Phenergan  -Advised patient to limit activity to necessity and may now bear weight with tall cam boot for short distances -Rx PT for the right -Advised patient to continue with ice and elevate as instructed -Will plan for check after starting PT. In the meantime, patient to call office if any issues or problems arise.    Landis Martins, DPM

## 2018-10-05 NOTE — Telephone Encounter (Signed)
Pt seen in office today, called back for refill of the pain medication.

## 2018-10-05 NOTE — Telephone Encounter (Signed)
Delivered orders to [facility] - In-office.

## 2018-10-05 NOTE — Telephone Encounter (Signed)
-----   Message from Landis Martins, [place] sent at 10/05/2018  1:54 PM EDT ----- Regarding: PT at [facility] S/p R achilles tendon surgery Start PT next week for gait, strengthening, and mobility

## 2018-10-05 NOTE — Telephone Encounter (Signed)
Pt called for refill of the pain medication. 

## 2018-10-08 ENCOUNTER — Telehealth: Payer: Self-pay

## 2018-10-08 ENCOUNTER — Other Ambulatory Visit: Payer: Self-pay | Admitting: Sports Medicine

## 2018-10-08 DIAGNOSIS — G8918 Other acute postprocedural pain: Secondary | ICD-10-CM

## 2018-10-08 MED ORDER — HYDROMORPHONE HCL 4 MG PO TABS: 4.0000 mg | ORAL_TABLET | ORAL | 0 refills | Status: DC | PRN

## 2018-10-08 NOTE — Progress Notes (Signed)
Dilaudid refilled  -Dr. Cannon Kettle

## 2018-10-08 NOTE — Telephone Encounter (Signed)
Patient called requesting a refill of her pain medication

## 2018-10-11 NOTE — Progress Notes (Signed)
CARDIOLOGY OFFICE NOTE  Date:  10/12/2018    Nicole RossettiMargaret M Gloria Date of Birth: 08-04-1970 Medical Record #[id number]  PCP:  Judy Pimpleower, Marne A, MD  Cardiologist:  Katrinka BlazingSmith    Chief Complaint  Patient presents with  . Pre-op Exam    History of Present Illness: Nicole Hughes is a 48 y.o. female who presents today for a work in/pre op clearance visit. Seen for Dr. Katrinka BlazingSmith.   Prior history consistent with post partum cardiomyopathy/heart failure as well as PE in 2000.  No recurrence of cardiac dysfunction since that time.  Last echocardiogram in 2014 demonstrated normal LV function.   Seen here in June of 2019 by Dr. Katrinka BlazingSmith after referral from PCP for atypical chest pain. Echo and GXT were obtained. She was treated with a short course of Colchicine.   The patient does not have symptoms concerning for COVID-19 infection (fever, chills, cough, or new shortness of breath).  Had right Achilles tendon repair with removal of bone spur back in August.    Comes in today. Here alone. Needing hysterectomy for vaginal issues - bleeding/pain/etc. Not scheduled - she says this is pending our clearance. No cardiac issues. No chest pain. Breathing is fine. Not dizzy. Very active as a general rule up until this surgery on her right foot. Also takes care of her father who has an LVAD. BP typically better at home - has been up some with the pain in her foot. She feels like she is doing well.  She is no longer drinking alcohol and attributes this to her doing so well.    Past Medical History:  Diagnosis Date  . Anxiety   . Cardiomyopathy (HCC)   . Chronic lower back pain   . Complication of anesthesia    "hard to get under"  . Cyst of ovary   . Depression    hx  . DVT complicating pregnancy 08/1998   RLE; RUE  . Dysmenorrhea   . Dyspareunia   . 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 GERD (gastroesophageal reflux disease)   . Hay fever    "fall and spring" (02/17/2017)  . Headache    "2-3 times/.wik" (02/17/2017)  . History of chicken pox   . Hypertension   . Migraine    "monthly" (02/17/2017)  . Pelvic pain   . Pulmonary embolism (HCC) 08/1998   S/P childbirth    Past Surgical History:  Procedure Laterality Date  . CESAREAN SECTION  1994  . ECTOPIC PREGNANCY SURGERY  2010  . LAPAROSCOPY N/A 09/24/2012   Procedure: OPERATIVE LAPROSCOPY WITH LYSIS OF ADHESIONS;  Surgeon: Sherron MondayJody [name], MD;  Location: [facility];  Service: Gynecology;  Laterality: N/A;  . TONSILLECTOMY  1984  . TUBAL LIGATION  2010     Medications: Current Meds  Medication Sig  . amLODipine (NORVASC) 10 MG tablet Take 1 tablet (10 mg total) by mouth daily.  Marland Kitchen. gabapentin (NEURONTIN) 300 MG capsule Take 1 capsule (300 mg total) by mouth 4 (four) times daily.  . hydrochlorothiazide (HYDRODIURIL) 25 MG tablet Take 1 tablet (25 mg total) by mouth daily as needed.  Marland Kitchen. HYDROmorphone (DILAUDID) 4 MG tablet Take 1 tablet (4 mg total) by mouth every 4 (four) hours as needed for up to 7 days for severe pain.  . hydrOXYzine (ATARAX/VISTARIL) 25 MG tablet Take 1 tablet (25 mg total) by mouth every 8 (eight) hours as needed for anxiety or nausea.  Marland Kitchen. ibuprofen (ADVIL) 800 MG tablet Take 1  tablet (800 mg total) by mouth every 8 (eight) hours as needed.  Marland Kitchen lisinopril (PRINIVIL,ZESTRIL) 10 MG tablet Take 2 tablets (20 mg total) by mouth daily.  . metoprolol succinate (TOPROL-XL) 50 MG 24 hr tablet Take 1 tablet (50 mg total) by mouth daily. Take with or immediately following a meal.  . mirtazapine (REMERON) 15 MG tablet Take 1 tablet (15 mg total) by mouth at bedtime.  . Multiple Vitamin (MULTIVITAMIN WITH MINERALS) TABS tablet Take 1 tablet by mouth daily.  Marland Kitchen omeprazole (PRILOSEC) 20 MG capsule Take 1 capsule (20 mg total) by mouth daily.  . ondansetron (ZOFRAN-ODT) 8 MG disintegrating tablet Take 1 tablet (8 mg total) by mouth every 8 (eight) hours as needed for nausea.  . promethazine (PHENERGAN) 25 MG tablet Take 1  tablet (25 mg total) by mouth every 8 (eight) hours as needed for nausea or vomiting.  . sertraline (ZOLOFT) 50 MG tablet Take 1 tablet (50 mg total) by mouth daily.     Allergies: No Known Allergies  Social History: The patient  reports that she has never smoked. She has never used smokeless tobacco. She reports previous alcohol use of about 5.0 standard drinks of alcohol per week. She reports that she does not use drugs.   Family History: The patient's family history includes Cancer - Lung in her mother; Heart failure in her father; Hypertension in her father.   Review of Systems: Please see the history of present illness.   All other systems are reviewed and negative.   Physical Exam: VS:  BP (!) 146/80   Pulse 84   Ht 5\' 5"  (1.651 m)   Wt 162 lb (73.5 kg)   SpO2 99%   BMI 26.96 kg/m  .  BMI Body mass index is 26.96 kg/m.  Wt Readings from Last 3 Encounters:  10/12/18 162 lb (73.5 kg)  06/23/18 167 lb (75.8 kg)  03/17/18 175 lb 8 oz (79.6 kg)    General: Pleasant. Alert and in no acute distress. Quite upbeat.  HEENT: Normal.  Neck: Supple, no JVD, carotid bruits, or masses noted.  Cardiac: Regular rate and rhythm. No murmurs, rubs, or gallops. No edema.  Respiratory:  Lungs are clear to auscultation bilaterally with normal work of breathing.  GI: Soft and nontender.  MS: No deformity or atrophy. Gait and ROM intact. She is hobbling - right leg with dressing in place.  Skin: Warm and dry. Color is normal.  Neuro:  Strength and sensation are intact and no gross focal deficits noted.  Psych: Alert, appropriate and with normal affect.   LABORATORY DATA:  EKG:  EKG is ordered today. This demonstrates NSR.  Lab Results  Component Value Date   WBC 6.0 09/12/2018   HGB 10.9 (L) 09/12/2018   HCT 36.3 09/12/2018   PLT 236 09/12/2018   GLUCOSE 94 09/12/2018   CHOL 199 04/09/2015   TRIG 95.0 04/09/2015   HDL 68.90 04/09/2015   LDLCALC 111 (H) 04/09/2015   ALT 39  09/12/2018   AST 39 09/12/2018   NA 137 09/12/2018   K 4.4 09/12/2018   CL 103 09/12/2018   CREATININE 0.83 09/12/2018   BUN 11 09/12/2018   CO2 23 09/12/2018   TSH 0.685 06/22/2017   INR 1.0 03/03/2018     BNP (last 3 results) No results for input(s): BNP in the last 8760 hours.  ProBNP (last 3 results) Recent Labs    03/17/18 1606  PROBNP 113.0*     Other Studies  Reviewed Today:  Echo Study Conclusions 06/2017  - Left ventricle: The cavity size was normal. Wall thickness was   normal. Systolic function was normal. The estimated ejection   fraction was in the range of 60% to 65%. Wall motion was normal;   there were no regional wall motion abnormalities. Features are   consistent with a pseudonormal left ventricular filling pattern,   with concomitant abnormal relaxation and increased filling   pressure (grade 2 diastolic dysfunction).   GXT Study Highlights 06/2017   Blood pressure demonstrated a normal response to exercise.  There was no ST segment deviation noted during stress.  No T wave inversion was noted during stress.  Negative, adequate stress test.      Assessment/Plan: 1. Pre op clearance for hysterectomy - she is ok to proceed from our standpoint. She is doing well without any cardiac complaint and can achieve over 4 mets of activity.   2. Prior history of PE and post partum cardiomyopathy - last echo with normal EF - she is doing very well clinically.   3. COVID-19 Education: The signs and symptoms of COVID-19 were discussed with the patient and how to seek care for testing (follow up with PCP or arrange E-visit).  The importance of social distancing, staying at home, hand hygiene and wearing a mask when out in public were discussed today.  Current medicines are reviewed with the patient today.  The patient does not have concerns regarding medicines other than what has been noted above.  The following changes have been made:  See above.   Labs/ tests ordered today include:    Orders Placed This Encounter  Procedures  . EKG 12-Lead     Disposition:   FU with [place] prn.   Patient is agreeable to this plan and will call if any problems develop in the interim.   SignedTruitt Merle, NP  10/12/2018 2:40 PM  [facility] [address] [place], [place]  [address] Phone: [phone] Fax: [phone]

## 2018-10-12 ENCOUNTER — Ambulatory Visit (INDEPENDENT_AMBULATORY_CARE_PROVIDER_SITE_OTHER): Payer: 59 | Admitting: Sports Medicine

## 2018-10-12 ENCOUNTER — Ambulatory Visit: Payer: 59 | Admitting: Nurse Practitioner

## 2018-10-12 ENCOUNTER — Encounter: Payer: Self-pay | Admitting: Nurse Practitioner

## 2018-10-12 ENCOUNTER — Other Ambulatory Visit: Payer: Self-pay

## 2018-10-12 VITALS — BP 146/80 | HR 84 | Ht 65.0 in | Wt 162.0 lb

## 2018-10-12 DIAGNOSIS — M7661 Achilles tendinitis, right leg: Secondary | ICD-10-CM | POA: Diagnosis not present

## 2018-10-12 DIAGNOSIS — Z01810 Encounter for preprocedural cardiovascular examination: Secondary | ICD-10-CM | POA: Diagnosis not present

## 2018-10-12 DIAGNOSIS — R609 Edema, unspecified: Secondary | ICD-10-CM

## 2018-10-12 DIAGNOSIS — Z09 Encounter for follow-up examination after completed treatment for conditions other than malignant neoplasm: Secondary | ICD-10-CM

## 2018-10-12 DIAGNOSIS — M792 Neuralgia and neuritis, unspecified: Secondary | ICD-10-CM

## 2018-10-12 DIAGNOSIS — M7660 Achilles tendinitis, unspecified leg: Secondary | ICD-10-CM

## 2018-10-12 DIAGNOSIS — G8918 Other acute postprocedural pain: Secondary | ICD-10-CM

## 2018-10-12 NOTE — Progress Notes (Signed)
Subjective: Nicole Hughes is a 48 y.o. 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  Patient admits pain at surgical site that is getting better but after the weekend did have an increase in pain because she did a little bit more walking and noticed more swelling. Reports that she will go for her hystrectomy soon and recently got approved for the surgery.  Patient is awaiting starting physical therapy is to start next week. No other issues noted.   Patient Active Problem List   Diagnosis Date Noted  . Insect bite 06/21/2018  . Pedal edema 03/31/2018  . Anemia 03/31/2018  . Appetite loss 03/08/2018  . Myofascial pain 03/08/2018  . IBS (irritable bowel syndrome) 03/08/2018  . Alcoholic hepatitis without ascites 03/05/2018  . Elevated LFTs 03/03/2018  . Achilles tendonitis 01/28/2018  . Abnormal cervical Papanicolaou smear 09/29/2017  . Insomnia 09/29/2017  . Pain 09/29/2017  . Palpitations 05/25/2017  . Chest wall pain 05/25/2017  . Elevated transaminase level 05/25/2017  . Depression with anxiety 02/23/2017  . 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 Hyponatremia 02/17/2017  . Hypokalemia 09/24/2016  . H/O alcohol abuse 09/22/2016  . H/O acute pancreatitis 09/22/2016  . GERD (gastroesophageal reflux disease) 04/09/2015  . 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 Chest pain 08/20/2012  . Stress reaction 07/23/2012  . Hypertension 06/09/2012  . History of pulmonary embolism 06/09/2012  . Preop cardiovascular exam 06/08/2012  . Dysmenorrhea   . Dyspareunia   . Pelvic pain     Current Outpatient Medications on File Prior to Visit  Medication Sig Dispense Refill  . amLODipine (NORVASC) 10 MG tablet Take 1 tablet (10 mg total) by mouth daily. 90 tablet 3  . gabapentin (NEURONTIN) 300 MG capsule Take 1 capsule (300 mg total) by mouth 4 (four) times daily. 90 capsule 3  .  hydrochlorothiazide (HYDRODIURIL) 25 MG tablet Take 1 tablet (25 mg total) by mouth daily as needed. 30 tablet 0  . HYDROmorphone (DILAUDID) 4 MG tablet Take 1 tablet (4 mg total) by mouth every 4 (four) hours as needed for up to 7 days for severe pain. 30 tablet 0  . hydrOXYzine (ATARAX/VISTARIL) 25 MG tablet Take 1 tablet (25 mg total) by mouth every 8 (eight) hours as needed for anxiety or nausea. 90 tablet 1  . ibuprofen (ADVIL) 800 MG tablet Take 1 tablet (800 mg total) by mouth every 8 (eight) hours as needed. 30 tablet 0  . lisinopril (PRINIVIL,ZESTRIL) 10 MG tablet Take 2 tablets (20 mg total) by mouth daily. 180 tablet 3  . metoprolol succinate (TOPROL-XL) 50 MG 24 hr tablet Take 1 tablet (50 mg total) by mouth daily. Take with or immediately following a meal. 90 tablet 3  . mirtazapine (REMERON) 15 MG tablet Take 1 tablet (15 mg total) by mouth at bedtime. 90 tablet 3  . Multiple Vitamin (MULTIVITAMIN WITH MINERALS) TABS tablet Take 1 tablet by mouth daily. 30 tablet 0  . omeprazole (PRILOSEC) 20 MG capsule Take 1 capsule (20 mg total) by mouth daily. 90 capsule 3  . ondansetron (ZOFRAN-ODT) 8 MG disintegrating tablet Take 1 tablet (8 mg total) by mouth every 8 (eight) hours as needed for nausea. 20 tablet 0  . promethazine (PHENERGAN) 25 MG tablet Take 1 tablet (25 mg total) by mouth every 8 (eight) hours as needed for nausea or vomiting. 20 tablet 0  . sertraline (ZOLOFT)  50 MG tablet Take 1 tablet (50 mg total) by mouth daily. 90 tablet 3   No current facility-administered medications on file prior to visit.     No Known Allergies  Objective: There were no vitals filed for this visit.  General: No acute distress, AAOx3  Right foot: Incision site scabbed over and intact with no gapping or dehiscence at surgical site, mild swelling to right heel, mild focal erythema, no warmth, no drainage, no other signs of infection noted, Capillary fill time <3 seconds in all digits, gross  sensation present via light touch to right foot.  Thompson sign negative.  No pain with calf compression.   Assessment and Plan:  Problem List Items Addressed This Visit      Musculoskeletal and Integument   Achilles tendonitis    Other Visit Diagnoses    Swelling    -  Primary   Post-op pain       Surgery follow-up       Neuritis          -Patient seen and evaluated -Applied Unna boot to right foot and lower leg to keep intact for 5 days after 5 days may remove and continue with compression sleeve -Continue with pain medication as dispensed as well as Motrin in between doses for additional pain relief -Continue Gabapentin as needed -Advised patient to limit activity to necessity and may now bear weight with tall cam boot for short distances like before but should still limit it to prevent swelling  -Awating PT for the right -Advised patient to continue with ice and elevate as instructed -Will plan for check after starting PT. In the meantime, patient to call office if any issues or problems arise.    Asencion Islam, DPM

## 2018-10-12 NOTE — Patient Instructions (Addendum)
After Visit Summary:  We will be checking the following labs today - NONE   Medication Instructions:    Continue with your current medicines.    If you need a refill on your cardiac medications before your next appointment, please call your pharmacy.     Testing/Procedures To Be Arranged:  N/A  Follow-Up:   See [place] back as needed.     At [facility], you and your health needs are our priority.  As part of our continuing mission to provide you with exceptional heart care, we have created designated Provider Care Teams.  These Care Teams include your primary Cardiologist (physician) and Advanced Practice Providers (APPs -  Physician Assistants and Nurse Practitioners) who all work together to provide you with the care you need, when you need it.  Special Instructions:  . Stay safe, stay home, wash your hands for at least 20 seconds and wear a mask when out in public.  . It was good to talk with you today.  . I will send a note to your surgeon - you are cleared for your hysterectomy.    Call the [place] office at [phone] if you have any questions, problems or concerns.

## 2018-10-14 ENCOUNTER — Other Ambulatory Visit: Payer: Self-pay | Admitting: Sports Medicine

## 2018-10-14 ENCOUNTER — Telehealth: Payer: Self-pay | Admitting: *Deleted

## 2018-10-14 DIAGNOSIS — G8918 Other acute postprocedural pain: Secondary | ICD-10-CM

## 2018-10-14 MED ORDER — HYDROMORPHONE HCL 4 MG PO TABS: 4.0000 mg | ORAL_TABLET | ORAL | 0 refills | Status: DC | PRN

## 2018-10-14 NOTE — Telephone Encounter (Signed)
Refill sent.

## 2018-10-14 NOTE — Progress Notes (Signed)
Refilled pain medication -Dr. Afia Messenger 

## 2018-10-14 NOTE — Telephone Encounter (Signed)
Pt called for a refill of her pain medication 

## 2018-10-19 ENCOUNTER — Other Ambulatory Visit: Payer: Self-pay | Admitting: Sports Medicine

## 2018-10-19 ENCOUNTER — Telehealth: Payer: Self-pay | Admitting: *Deleted

## 2018-10-19 DIAGNOSIS — G8918 Other acute postprocedural pain: Secondary | ICD-10-CM

## 2018-10-19 MED ORDER — HYDROMORPHONE HCL 4 MG PO TABS: 4.0000 mg | ORAL_TABLET | ORAL | 0 refills | Status: DC | PRN

## 2018-10-19 NOTE — Telephone Encounter (Signed)
Pt request refill of the pain medication, for tomorrow morning and she is starting PT today.

## 2018-10-19 NOTE — Progress Notes (Signed)
Refilled post op pain mediation  -Dr. Cannon Kettle

## 2018-10-19 NOTE — Telephone Encounter (Signed)
Refill sent.

## 2018-10-25 ENCOUNTER — Other Ambulatory Visit: Payer: Self-pay | Admitting: Sports Medicine

## 2018-10-25 ENCOUNTER — Telehealth: Payer: Self-pay | Admitting: *Deleted

## 2018-10-25 DIAGNOSIS — G8918 Other acute postprocedural pain: Secondary | ICD-10-CM

## 2018-10-25 MED ORDER — HYDROMORPHONE HCL 4 MG PO TABS: 4.0000 mg | ORAL_TABLET | Freq: Four times a day (QID) | ORAL | 0 refills | Status: DC | PRN

## 2018-10-25 NOTE — Progress Notes (Signed)
Refilled Dilaudid for post op pain

## 2018-10-25 NOTE — Telephone Encounter (Signed)
Pt called for refill of the hydromorphone.

## 2018-10-26 ENCOUNTER — Encounter: Payer: 59 | Admitting: Sports Medicine

## 2018-11-01 ENCOUNTER — Telehealth: Payer: Self-pay | Admitting: *Deleted

## 2018-11-01 ENCOUNTER — Other Ambulatory Visit: Payer: Self-pay | Admitting: Sports Medicine

## 2018-11-01 DIAGNOSIS — G8918 Other acute postprocedural pain: Secondary | ICD-10-CM

## 2018-11-01 MED ORDER — HYDROMORPHONE HCL 4 MG PO TABS: 4.0000 mg | ORAL_TABLET | Freq: Four times a day (QID) | ORAL | 0 refills | Status: DC | PRN

## 2018-11-01 MED ORDER — IBUPROFEN 800 MG PO TABS: 800.0000 mg | ORAL_TABLET | Freq: Three times a day (TID) | ORAL | 0 refills | Status: DC | PRN

## 2018-11-01 NOTE — Progress Notes (Signed)
Post op pain med refilled

## 2018-11-01 NOTE — Telephone Encounter (Signed)
Refilled pain medication. She needs to keep taking her motrin as well for inflammation control. She need to read her Rx and take it as instructed. I have to wean her off the pain medication slowly so that way her body does not get addicted or use to the same dose safely -Dr. Cannon Kettle

## 2018-11-01 NOTE — Telephone Encounter (Signed)
Pt states she needs a refill of the pain medication, PT at home is not helping, pain is worse in the morning and is very swollen and hot. I offered pt an appt, and she states she has not made an appt due to taking care of ftr who was tested last week for COVID, but has no results yet. Pt complained she did not know the change of dosage and was taking it as previously. I told pt that she should always read the medication instructions when getting refills.

## 2018-11-01 NOTE — Telephone Encounter (Signed)
Left message informing pt of Dr. Leeanne Rio refill and instructions.

## 2018-11-08 ENCOUNTER — Other Ambulatory Visit: Payer: Self-pay | Admitting: Sports Medicine

## 2018-11-08 ENCOUNTER — Telehealth: Payer: Self-pay | Admitting: *Deleted

## 2018-11-08 DIAGNOSIS — G8918 Other acute postprocedural pain: Secondary | ICD-10-CM

## 2018-11-08 NOTE — Telephone Encounter (Signed)
You may see it as pending. For some reason it will not let me sign it from home. I will try again as soon as I get to office in the morning. -Dr. Cannon Kettle

## 2018-11-08 NOTE — Telephone Encounter (Signed)
Pt request refill of the pain medication, has shooting pain in the side of the foot when walking.

## 2018-11-08 NOTE — Telephone Encounter (Signed)
Left message informing pt of Dr. Leeanne Rio change of Dilaudid orders and to follow the instructions and to wear the ace wrap and cam boot, decrease activity, and to rest.

## 2018-11-08 NOTE — Telephone Encounter (Signed)
Decreased pain medication from 4mg  to 2mg  of Dilaudid. Advise use of ace wrap and cam boot to help with sharp pain when walking and to decrease activities/rest. -Dr. Cannon Kettle

## 2018-11-08 NOTE — Progress Notes (Signed)
Decreased pain medication from 4mg to 2mg of Dilaudid. Advise use of ace wrap and cam boot to help with sharp pain when walking and to decrease activities/rest. -Dr. Terianne Thaker 

## 2018-11-09 MED ORDER — HYDROMORPHONE HCL 2 MG PO TABS: 2.0000 mg | ORAL_TABLET | ORAL | 0 refills | Status: DC | PRN

## 2018-11-15 ENCOUNTER — Telehealth: Payer: Self-pay | Admitting: Sports Medicine

## 2018-11-15 ENCOUNTER — Other Ambulatory Visit: Payer: Self-pay | Admitting: Sports Medicine

## 2018-11-15 DIAGNOSIS — G8918 Other acute postprocedural pain: Secondary | ICD-10-CM

## 2018-11-15 MED ORDER — HYDROMORPHONE HCL 2 MG PO TABS: 2.0000 mg | ORAL_TABLET | Freq: Four times a day (QID) | ORAL | 0 refills | Status: AC | PRN

## 2018-11-15 NOTE — Telephone Encounter (Signed)
Leland Her called for a refill on her pain medication.

## 2018-11-15 NOTE — Progress Notes (Signed)
Refilled pain medication. Patient is eligible for 1 more refill then after will have to be referred to pain management. Thanks Dr. Cannon Kettle

## 2018-11-16 NOTE — Telephone Encounter (Signed)
I informed pt the medication dilaudid was filled yesterday and not available for refill prior to 11/22/2018. Pt states she just had not called the pharmacy to see if that was the medication the pharmacy messaged her on.

## 2018-11-23 ENCOUNTER — Telehealth: Payer: Self-pay | Admitting: *Deleted

## 2018-11-23 ENCOUNTER — Other Ambulatory Visit: Payer: Self-pay | Admitting: Sports Medicine

## 2018-11-23 MED ORDER — HYDROMORPHONE HCL 2 MG PO TABS: 2.0000 mg | ORAL_TABLET | Freq: Four times a day (QID) | ORAL | 0 refills | Status: DC | PRN

## 2018-11-23 NOTE — Telephone Encounter (Signed)
I will refill medication and assess her at next visit to discuss her pain -Dr. Cannon Kettle

## 2018-11-23 NOTE — Progress Notes (Signed)
Refilled pain medication and will discuss sharp pain at next office visit

## 2018-11-23 NOTE — Telephone Encounter (Signed)
Pt request a refill of the pain medication, states she has an appt next week, but continues to have a sharp pain when stepping down, is performing the PT and wearing the boot as recommended by Lynn Ito, but is still having pain and would like to know what else she can do.

## 2018-11-30 ENCOUNTER — Ambulatory Visit: Payer: 59 | Admitting: Sports Medicine

## 2018-11-30 ENCOUNTER — Other Ambulatory Visit: Payer: Self-pay

## 2018-11-30 ENCOUNTER — Encounter: Payer: Self-pay | Admitting: Sports Medicine

## 2018-11-30 DIAGNOSIS — G8918 Other acute postprocedural pain: Secondary | ICD-10-CM

## 2018-11-30 DIAGNOSIS — M792 Neuralgia and neuritis, unspecified: Secondary | ICD-10-CM

## 2018-11-30 DIAGNOSIS — M7660 Achilles tendinitis, unspecified leg: Secondary | ICD-10-CM

## 2018-11-30 DIAGNOSIS — R609 Edema, unspecified: Secondary | ICD-10-CM

## 2018-11-30 DIAGNOSIS — M7661 Achilles tendinitis, right leg: Secondary | ICD-10-CM | POA: Diagnosis not present

## 2018-11-30 DIAGNOSIS — Z09 Encounter for follow-up examination after completed treatment for conditions other than malignant neoplasm: Secondary | ICD-10-CM

## 2018-11-30 MED ORDER — IBUPROFEN 800 MG PO TABS: 800.0000 mg | ORAL_TABLET | Freq: Three times a day (TID) | ORAL | 0 refills | Status: DC | PRN

## 2018-11-30 MED ORDER — HYDROMORPHONE HCL 2 MG PO TABS: 2.0000 mg | ORAL_TABLET | Freq: Four times a day (QID) | ORAL | 0 refills | Status: DC | PRN

## 2018-11-30 MED ORDER — PREDNISONE 10 MG (21) PO TBPK: ORAL_TABLET | ORAL | 0 refills | Status: DC

## 2018-11-30 MED ORDER — GABAPENTIN 300 MG PO CAPS: 300.0000 mg | ORAL_CAPSULE | Freq: Four times a day (QID) | ORAL | 3 refills | Status: DC

## 2018-11-30 NOTE — Progress Notes (Signed)
Subjective: Nicole Hughes is a 48 y.o. 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  Patient admits pain at surgical site that is still bothering her with sharp pain. Reports that she is worried about her dad. No other issues noted.   Patient Active Problem List   Diagnosis Date Noted  . Insect bite 06/21/2018  . Pedal edema 03/31/2018  . Anemia 03/31/2018  . Appetite loss 03/08/2018  . Myofascial pain 03/08/2018  . IBS (irritable bowel syndrome) 03/08/2018  . Alcoholic hepatitis without ascites 03/05/2018  . Elevated LFTs 03/03/2018  . Achilles tendonitis 01/28/2018  . Abnormal cervical Papanicolaou smear 09/29/2017  . Insomnia 09/29/2017  . Pain 09/29/2017  . Palpitations 05/25/2017  . Chest wall pain 05/25/2017  . Elevated transaminase level 05/25/2017  . Depression with anxiety 02/23/2017  . 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 Hyponatremia 02/17/2017  . Hypokalemia 09/24/2016  . H/O alcohol abuse 09/22/2016  . H/O acute pancreatitis 09/22/2016  . GERD (gastroesophageal reflux disease) 04/09/2015  . 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 Chest pain 08/20/2012  . Stress reaction 07/23/2012  . Hypertension 06/09/2012  . History of pulmonary embolism 06/09/2012  . Preop cardiovascular exam 06/08/2012  . Dysmenorrhea   . Dyspareunia   . Pelvic pain     Current Outpatient Medications on File Prior to Visit  Medication Sig Dispense Refill  . amLODipine (NORVASC) 10 MG tablet Take 1 tablet (10 mg total) by mouth daily. 90 tablet 3  . hydrochlorothiazide (HYDRODIURIL) 25 MG tablet Take 1 tablet (25 mg total) by mouth daily as needed. 30 tablet 0  . hydrOXYzine (ATARAX/VISTARIL) 25 MG tablet Take 1 tablet (25 mg total) by mouth every 8 (eight) hours as needed for anxiety or nausea. 90 tablet 1  . lisinopril (PRINIVIL,ZESTRIL) 10 MG tablet Take 2 tablets (20 mg  total) by mouth daily. 180 tablet 3  . metoprolol succinate (TOPROL-XL) 50 MG 24 hr tablet Take 1 tablet (50 mg total) by mouth daily. Take with or immediately following a meal. 90 tablet 3  . mirtazapine (REMERON) 15 MG tablet Take 1 tablet (15 mg total) by mouth at bedtime. 90 tablet 3  . Multiple Vitamin (MULTIVITAMIN WITH MINERALS) TABS tablet Take 1 tablet by mouth daily. 30 tablet 0  . omeprazole (PRILOSEC) 20 MG capsule Take 1 capsule (20 mg total) by mouth daily. 90 capsule 3  . ondansetron (ZOFRAN-ODT) 8 MG disintegrating tablet Take 1 tablet (8 mg total) by mouth every 8 (eight) hours as needed for nausea. 20 tablet 0  . promethazine (PHENERGAN) 25 MG tablet Take 1 tablet (25 mg total) by mouth every 8 (eight) hours as needed for nausea or vomiting. 20 tablet 0  . sertraline (ZOLOFT) 50 MG tablet Take 1 tablet (50 mg total) by mouth daily. 90 tablet 3   No current facility-administered medications on file prior to visit.     No Known Allergies  Objective: There were no vitals filed for this visit.  General: No acute distress, AAOx3  Right foot: Incision site healed, mild swelling to right heel, no warmth, no drainage, no other signs of infection noted, Capillary fill time <3 seconds in all digits, gross sensation present via light touch to right foot.  Thompson sign negative.  No pain with calf compression.   Assessment and Plan:  Problem List Items Addressed This Visit      Musculoskeletal and  Integument   Achilles tendonitis    Other Visit Diagnoses    Post-op pain    -  Primary   Swelling       Surgery follow-up       Neuritis          -Patient seen and evaluated -Surgitube compression sleeve applied to right  -Continue with pain medication as dispensed as well as Motrin in between doses for additional pain relief -Continue Gabapentin as needed -Rx Prednisone -Refilled all medications as above -Dispensed Night splint to use as instructed -Advised patient to limit  activity to tolerance -Continue with PT for the right -Advised patient to continue with ice and elevate as previous -May use scar creams or pain patches as needed -Will plan for post op check and xray in 6-8 weeks.  In the meantime, patient to call office if any issues or problems arise.    Landis Martins, DPM

## 2018-12-06 ENCOUNTER — Telehealth: Payer: Self-pay | Admitting: *Deleted

## 2018-12-06 ENCOUNTER — Other Ambulatory Visit: Payer: Self-pay | Admitting: Sports Medicine

## 2018-12-06 MED ORDER — HYDROMORPHONE HCL 2 MG PO TABS: 2.0000 mg | ORAL_TABLET | Freq: Four times a day (QID) | ORAL | 0 refills | Status: DC | PRN

## 2018-12-06 NOTE — Telephone Encounter (Signed)
Left message informing pt of Dr. Stover's orders. 

## 2018-12-06 NOTE — Telephone Encounter (Signed)
Yes have her apply neosporin pain relief once a day to the area. Cover with bandaid during the day and remove it at night to let it get some air. Closely watch it. If worsens call us back. Sending in pain med refill now -Dr. Cannon Kettle

## 2018-12-06 NOTE — Telephone Encounter (Signed)
Pt states the last bit of the scabbing has opened, there is no longer any drainage, the the area is painful, and she needs pain medication for tomorrow and wanted to know if she needed an antibiotic cream to the site and a dressing.

## 2018-12-06 NOTE — Progress Notes (Signed)
Refilled pain medication

## 2018-12-13 ENCOUNTER — Telehealth: Payer: Self-pay | Admitting: *Deleted

## 2018-12-13 NOTE — Telephone Encounter (Signed)
Pt called and stated the skin comes off the knot in the shower and it looks raw and has a burning pain that will wake her at night. I asked pt to place pillows beneath her entire leg and allow the heel to hang off and that would offload the back of the heel when resting and help decrease some of her discomfort.

## 2018-12-13 NOTE — Telephone Encounter (Signed)
Pt called states she is using the neosporin with pain relief, but when she shower's the skin comes off and is painful, and she would like a refill of the pain medication and nausea medication, she has the gabapentin.

## 2018-12-13 NOTE — Telephone Encounter (Signed)
Left message requesting more information concerning the pain with the peeling skin, and that Dr. Cannon Kettle was in surgery today so I would need to send a message.

## 2018-12-14 ENCOUNTER — Other Ambulatory Visit: Payer: Self-pay | Admitting: Sports Medicine

## 2018-12-14 ENCOUNTER — Telehealth: Payer: Self-pay | Admitting: *Deleted

## 2018-12-14 MED ORDER — HYDROMORPHONE HCL 2 MG PO TABS: 2.0000 mg | ORAL_TABLET | Freq: Four times a day (QID) | ORAL | 0 refills | Status: DC | PRN

## 2018-12-14 NOTE — Telephone Encounter (Signed)
Left message for pt to dress the surgery foot after painting with betadine and cover with bandaid and if Dr. Cannon Kettle said she did not have to walk or sleep in the boot then she didn't have to be in the boot and gabapentin was the pain medication.

## 2018-12-14 NOTE — Telephone Encounter (Signed)
Left message with Dr. Leeanne Rio 12/14/2018 7:12am orders.

## 2018-12-14 NOTE — Telephone Encounter (Signed)
Bandaid off at night. Continue with boot if it is not rubbing the area. Pain meds have been sent

## 2018-12-14 NOTE — Telephone Encounter (Signed)
She can switch from using neosporin to betadine there and leave open to air at night to help the skin blister/thats peeling and looks raw dry up Thanks Dr. Chauncey Cruel

## 2018-12-14 NOTE — Telephone Encounter (Signed)
Pt called asked if she should put on a bandaid after using applying the betadine, and should she continue the boot at night, and was Dr. Cannon Kettle going to send in more pain medication.

## 2018-12-14 NOTE — Progress Notes (Signed)
Pain med refilled 

## 2018-12-15 NOTE — Telephone Encounter (Signed)
Left message informing pt of Dr. Leeanne Rio 12/14/2018 5:51pm.

## 2018-12-21 ENCOUNTER — Telehealth: Payer: Self-pay | Admitting: *Deleted

## 2018-12-21 ENCOUNTER — Other Ambulatory Visit: Payer: Self-pay | Admitting: Sports Medicine

## 2018-12-21 MED ORDER — HYDROMORPHONE HCL 2 MG PO TABS: 2.0000 mg | ORAL_TABLET | Freq: Four times a day (QID) | ORAL | 0 refills | Status: DC | PRN

## 2018-12-21 MED ORDER — IBUPROFEN 800 MG PO TABS: 800.0000 mg | ORAL_TABLET | Freq: Three times a day (TID) | ORAL | 0 refills | Status: DC | PRN

## 2018-12-21 NOTE — Telephone Encounter (Signed)
Left message requesting a call to discuss her comfort level.

## 2018-12-21 NOTE — Telephone Encounter (Signed)
Pt states she would like a refill of the ibuprofen and pain medication.

## 2018-12-21 NOTE — Telephone Encounter (Signed)
Pt called and states she is not doing well, she is staying with her ftr and his heat and water have gone out and she is doing more for him. Pt asked how often can she take the steroids, because she really got relief with them. I told pt I had refilled the ibuprofen and if Dr. Cannon Kettle refilled the steroid she could not take the ibuprofen until the steroid pack was completed. Pt states understanding.

## 2018-12-21 NOTE — Progress Notes (Signed)
Pain medication refilled

## 2018-12-22 NOTE — Telephone Encounter (Signed)
Pt called to see if her pain medication prescription had been called to the pharmacy. I left message informing pt the [facility] at 6:01[facility].

## 2018-12-28 ENCOUNTER — Other Ambulatory Visit: Payer: Self-pay | Admitting: Sports Medicine

## 2018-12-28 ENCOUNTER — Telehealth: Payer: Self-pay | Admitting: *Deleted

## 2018-12-28 MED ORDER — HYDROMORPHONE HCL 2 MG PO TABS: 2.0000 mg | ORAL_TABLET | Freq: Four times a day (QID) | ORAL | 0 refills | Status: AC | PRN

## 2018-12-28 MED ORDER — PREDNISONE 10 MG (21) PO TBPK: ORAL_TABLET | ORAL | 0 refills | Status: DC

## 2018-12-28 NOTE — Telephone Encounter (Signed)
Pt called states she was able to get help from the [place], and will have a nurse appointed to her ftr's care the 1st week of January, pt would like refill of the pain medication for another week, and if possible a refill of the steroid pack for next week.

## 2018-12-28 NOTE — Telephone Encounter (Signed)
That's great news. Meds refilled. -Dr. Chauncey Cruel

## 2018-12-28 NOTE — Telephone Encounter (Signed)
Left message informing pt the medications had been sent to the pharmacy.

## 2018-12-28 NOTE — Progress Notes (Signed)
Meds refilled.

## 2019-01-11 ENCOUNTER — Ambulatory Visit (INDEPENDENT_AMBULATORY_CARE_PROVIDER_SITE_OTHER): Payer: 59 | Admitting: Sports Medicine

## 2019-01-11 ENCOUNTER — Ambulatory Visit (INDEPENDENT_AMBULATORY_CARE_PROVIDER_SITE_OTHER): Payer: 59

## 2019-01-11 ENCOUNTER — Encounter: Payer: Self-pay | Admitting: Sports Medicine

## 2019-01-11 ENCOUNTER — Other Ambulatory Visit: Payer: Self-pay

## 2019-01-11 DIAGNOSIS — G8918 Other acute postprocedural pain: Secondary | ICD-10-CM

## 2019-01-11 DIAGNOSIS — M7671 Peroneal tendinitis, right leg: Secondary | ICD-10-CM | POA: Diagnosis not present

## 2019-01-11 DIAGNOSIS — R609 Edema, unspecified: Secondary | ICD-10-CM

## 2019-01-11 DIAGNOSIS — Z09 Encounter for follow-up examination after completed treatment for conditions other than malignant neoplasm: Secondary | ICD-10-CM | POA: Diagnosis not present

## 2019-01-11 DIAGNOSIS — M7661 Achilles tendinitis, right leg: Secondary | ICD-10-CM

## 2019-01-11 DIAGNOSIS — M792 Neuralgia and neuritis, unspecified: Secondary | ICD-10-CM

## 2019-01-11 MED ORDER — IBUPROFEN 800 MG PO TABS: 800.0000 mg | ORAL_TABLET | Freq: Three times a day (TID) | ORAL | 0 refills | Status: DC | PRN

## 2019-01-11 MED ORDER — HYDROMORPHONE HCL 2 MG PO TABS: 2.0000 mg | ORAL_TABLET | Freq: Four times a day (QID) | ORAL | 0 refills | Status: AC | PRN

## 2019-01-11 MED ORDER — TRIAMCINOLONE ACETONIDE 10 MG/ML IJ SUSP
10.0000 mg | Freq: Once | INTRAMUSCULAR | Status: AC
  Administered 2019-01-11: 22:00:00 10 mg

## 2019-01-11 NOTE — Progress Notes (Signed)
Subjective: Nicole Hughes is a 49 y.o. 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  Patient admits pain on side of heel on right with increased constant pain since Wednesday of burning and pulling sensation with each step that has been hurting more. Reports that she is worried about her dad and still has to do a lot even though he has a aide that comes 2x per week. No other issues noted.   Patient Active Problem List   Diagnosis Date Noted  . Insect bite 06/21/2018  . Pedal edema 03/31/2018  . Anemia 03/31/2018  . Appetite loss 03/08/2018  . Myofascial pain 03/08/2018  . IBS (irritable bowel syndrome) 03/08/2018  . Alcoholic hepatitis without ascites 03/05/2018  . Elevated LFTs 03/03/2018  . Achilles tendonitis 01/28/2018  . Abnormal cervical Papanicolaou smear 09/29/2017  . Insomnia 09/29/2017  . Pain 09/29/2017  . Palpitations 05/25/2017  . Chest wall pain 05/25/2017  . Elevated transaminase level 05/25/2017  . Depression with anxiety 02/23/2017  . 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 Hyponatremia 02/17/2017  . Hypokalemia 09/24/2016  . H/O alcohol abuse 09/22/2016  . H/O acute pancreatitis 09/22/2016  . GERD (gastroesophageal reflux disease) 04/09/2015  . 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 Chest pain 08/20/2012  . Stress reaction 07/23/2012  . Hypertension 06/09/2012  . History of pulmonary embolism 06/09/2012  . Preop cardiovascular exam 06/08/2012  . Dysmenorrhea   . Dyspareunia   . Pelvic pain     Current Outpatient Medications on File Prior to Visit  Medication Sig Dispense Refill  . amLODipine (NORVASC) 10 MG tablet Take 1 tablet (10 mg total) by mouth daily. 90 tablet 3  . gabapentin (NEURONTIN) 300 MG capsule Take 1 capsule (300 mg total) by mouth 4 (four) times daily. 90 capsule 3  . hydrochlorothiazide (HYDRODIURIL) 25 MG tablet Take 1  tablet (25 mg total) by mouth daily as needed. 30 tablet 0  . hydrOXYzine (ATARAX/VISTARIL) 25 MG tablet Take 1 tablet (25 mg total) by mouth every 8 (eight) hours as needed for anxiety or nausea. 90 tablet 1  . ibuprofen (ADVIL) 800 MG tablet Take 1 tablet (800 mg total) by mouth every 8 (eight) hours as needed. 30 tablet 0  . lisinopril (PRINIVIL,ZESTRIL) 10 MG tablet Take 2 tablets (20 mg total) by mouth daily. 180 tablet 3  . metoprolol succinate (TOPROL-XL) 50 MG 24 hr tablet Take 1 tablet (50 mg total) by mouth daily. Take with or immediately following a meal. 90 tablet 3  . mirtazapine (REMERON) 15 MG tablet Take 1 tablet (15 mg total) by mouth at bedtime. 90 tablet 3  . Multiple Vitamin (MULTIVITAMIN WITH MINERALS) TABS tablet Take 1 tablet by mouth daily. 30 tablet 0  . omeprazole (PRILOSEC) 20 MG capsule Take 1 capsule (20 mg total) by mouth daily. 90 capsule 3  . ondansetron (ZOFRAN-ODT) 8 MG disintegrating tablet Take 1 tablet (8 mg total) by mouth every 8 (eight) hours as needed for nausea. 20 tablet 0  . predniSONE (STERAPRED UNI-PAK 21 TAB) 10 MG (21) TBPK tablet Take as directed 21 tablet 0  . promethazine (PHENERGAN) 25 MG tablet Take 1 tablet (25 mg total) by mouth every 8 (eight) hours as needed for nausea or vomiting. 20 tablet 0  . sertraline (ZOLOFT) 50 MG tablet Take 1 tablet (50 mg total) by mouth daily. 90 tablet 3   No current facility-administered  medications on file prior to visit.    No Known Allergies  Objective: There were no vitals filed for this visit.  General: No acute distress, AAOx3  Right foot: Incision site healed, mild swelling to right heel, no warmth, no drainage, no other signs of infection noted, Capillary fill time <3 seconds in all digits, gross sensation present via light touch to right foot.  Thompson sign negative.  Pain at lateral heel at peroneal tendon course. No pain with calf compression.   Assessment and Plan:  Problem List Items  Addressed This Visit    None    Visit Diagnoses    Post-op pain    -  Primary   Relevant Orders   DG Foot Complete Right (Completed)   Tendonitis, Achilles, right       Relevant Orders   DG Foot Complete Right (Completed)   Swelling       Surgery follow-up       Neuritis       Peroneal tendonitis, right       Relevant Medications   triamcinolone acetonide (KENALOG) 10 MG/ML injection 10 mg (Start on 01/11/2019 10:00 PM)      -Patient seen and evaluated -After oral consent and aseptic prep, injected a mixture containing 1 ml of 2%  plain lidocaine, 1 ml 0.5% plain marcaine, 0.5 ml of kenalog 10 and 0.5 ml of dexamethasone phosphate into R peroneal tendon course without complication. Post-injection care discussed with patient.  -Dispensed achilles sleeve to use as instructed on right  -Continue with pain medication as dispensed as well as Motrin in between doses for additional pain relief -Continue Gabapentin as needed -Refilled all medications as above -Hold off on using Night splint if makes pain worse -May continue PT at home to tolerance avoid if painful  -Advised patient to limit activity to tolerance  -Advised patient to continue with ice and elevate as previous -May use scar creams or pain patches as needed -Rx MRI to r/o new problem with Peroneal or any problem with achilles at previous surgery site -Will plan for review of MRI results at next visit. In the meantime, patient to call office if any issues or problems arise.    Asencion Islam, DPM

## 2019-01-12 ENCOUNTER — Telehealth: Payer: Self-pay | Admitting: *Deleted

## 2019-01-12 DIAGNOSIS — M7671 Peroneal tendinitis, right leg: Secondary | ICD-10-CM

## 2019-01-12 DIAGNOSIS — R609 Edema, unspecified: Secondary | ICD-10-CM

## 2019-01-12 DIAGNOSIS — M7661 Achilles tendinitis, right leg: Secondary | ICD-10-CM

## 2019-01-12 DIAGNOSIS — G8918 Other acute postprocedural pain: Secondary | ICD-10-CM

## 2019-01-12 NOTE — Telephone Encounter (Signed)
-----   Message from [place], [place] sent at 01/11/2019  4:16 PM EST ----- Regarding: MRI R heel Evaluate Peroneal tendon, pain at lateral aspect on ankle History of previous achilles surgery

## 2019-01-12 NOTE — Telephone Encounter (Signed)
Faxed orders to [facility], given to Dr. Wynema Birch assistant.

## 2019-01-18 ENCOUNTER — Other Ambulatory Visit: Payer: 59

## 2019-01-20 ENCOUNTER — Other Ambulatory Visit: Payer: Self-pay | Admitting: Sports Medicine

## 2019-01-20 ENCOUNTER — Telehealth: Payer: Self-pay | Admitting: *Deleted

## 2019-01-20 DIAGNOSIS — M7671 Peroneal tendinitis, right leg: Secondary | ICD-10-CM

## 2019-01-20 DIAGNOSIS — Z09 Encounter for follow-up examination after completed treatment for conditions other than malignant neoplasm: Secondary | ICD-10-CM

## 2019-01-20 DIAGNOSIS — M7661 Achilles tendinitis, right leg: Secondary | ICD-10-CM

## 2019-01-20 DIAGNOSIS — R609 Edema, unspecified: Secondary | ICD-10-CM

## 2019-01-20 DIAGNOSIS — M792 Neuralgia and neuritis, unspecified: Secondary | ICD-10-CM

## 2019-01-20 DIAGNOSIS — G8918 Other acute postprocedural pain: Secondary | ICD-10-CM

## 2019-01-20 MED ORDER — HYDROMORPHONE HCL 2 MG PO TABS: 2.0000 mg | ORAL_TABLET | Freq: Four times a day (QID) | ORAL | 0 refills | Status: DC | PRN

## 2019-01-20 NOTE — Telephone Encounter (Signed)
Refill sent. Since her MRI was not approved. Please order a [facility] ultrasound of her surgical foot at the achilles and peroneal tendon, r/o tear vs tendonitis Thanks Dr. Marylene Land

## 2019-01-20 NOTE — Telephone Encounter (Signed)
Faxed orders to [facility]. 

## 2019-01-20 NOTE — Progress Notes (Signed)
Refilled pain medication

## 2019-01-20 NOTE — Telephone Encounter (Signed)
Pt states  Imaging said her MRI had not been approved, pt is requesting pain medication.

## 2019-01-20 NOTE — Telephone Encounter (Signed)
Message left informing pt the pain medication had been sent to the pharmacy and Dr. Marylene Land had ordered US of right ankle.

## 2019-01-26 ENCOUNTER — Telehealth: Payer: Self-pay | Admitting: *Deleted

## 2019-01-26 ENCOUNTER — Other Ambulatory Visit: Payer: Self-pay | Admitting: Sports Medicine

## 2019-01-26 DIAGNOSIS — G8918 Other acute postprocedural pain: Secondary | ICD-10-CM

## 2019-01-26 MED ORDER — HYDROMORPHONE HCL 2 MG PO TABS: 2.0000 mg | ORAL_TABLET | Freq: Four times a day (QID) | ORAL | 0 refills | Status: DC | PRN

## 2019-01-26 NOTE — Telephone Encounter (Signed)
Refill sent.

## 2019-01-26 NOTE — Telephone Encounter (Signed)
Left message informing pt the [place] had been ordered and she could contact [facility] and schedule her appt [phone].

## 2019-01-26 NOTE — Telephone Encounter (Signed)
Pt called states she knows she can get her pain medication tomorrow, and still has not scheduled the [place].

## 2019-01-26 NOTE — Progress Notes (Signed)
Refilled pain med 

## 2019-02-01 ENCOUNTER — Telehealth: Payer: Self-pay | Admitting: *Deleted

## 2019-02-01 ENCOUNTER — Other Ambulatory Visit: Payer: Self-pay | Admitting: Sports Medicine

## 2019-02-01 DIAGNOSIS — G8918 Other acute postprocedural pain: Secondary | ICD-10-CM

## 2019-02-01 MED ORDER — HYDROMORPHONE HCL 2 MG PO TABS: 2.0000 mg | ORAL_TABLET | Freq: Four times a day (QID) | ORAL | 0 refills | Status: DC | PRN

## 2019-02-01 NOTE — Telephone Encounter (Signed)
Pt states she has [place] 02/04/2019, picked up her Gabapentin, and would like a refill of the pain medication.

## 2019-02-01 NOTE — Telephone Encounter (Signed)
Refill sent.

## 2019-02-01 NOTE — Progress Notes (Signed)
Refilled pain medication

## 2019-02-04 ENCOUNTER — Other Ambulatory Visit: Payer: 59

## 2019-02-08 ENCOUNTER — Telehealth: Payer: Self-pay | Admitting: *Deleted

## 2019-02-08 ENCOUNTER — Other Ambulatory Visit: Payer: Self-pay | Admitting: Sports Medicine

## 2019-02-08 DIAGNOSIS — G8918 Other acute postprocedural pain: Secondary | ICD-10-CM

## 2019-02-08 MED ORDER — HYDROMORPHONE HCL 2 MG PO TABS: 2.0000 mg | ORAL_TABLET | Freq: Four times a day (QID) | ORAL | 0 refills | Status: AC | PRN

## 2019-02-08 MED ORDER — IBUPROFEN 800 MG PO TABS: 800.0000 mg | ORAL_TABLET | Freq: Three times a day (TID) | ORAL | 0 refills | Status: DC | PRN

## 2019-02-08 NOTE — Telephone Encounter (Signed)
Called and left a message for the patient stating that I left a message for the doctor to refill medicine. Misty Stanley

## 2019-02-08 NOTE — Progress Notes (Signed)
Refilled pain meds 

## 2019-02-09 ENCOUNTER — Telehealth: Payer: Self-pay | Admitting: *Deleted

## 2019-02-09 NOTE — Telephone Encounter (Signed)
Called and left a message for the patient that the refill was sent and that patient would have to call the imaging center to reschedule. Misty Stanley

## 2019-02-09 NOTE — Telephone Encounter (Signed)
-----   Message from Asencion Islam, [place] sent at 02/08/2019  5:41 PM EST ----- Refill sent. But patient has to call imaging center herself to get her ultrasound rescheduled ----- Message ----- From: Lanney Gins, [facility] Sent: 02/08/2019   4:29 PM EST To: Asencion Islam, DPM  Hey Dr Marylene Land, patient called and had to cancel the ultrasound on Friday due to car broke down and would like a refill of the pain medicine and the ibuprofen and to re-schedule the ultrasound. Misty Stanley

## 2019-02-10 ENCOUNTER — Telehealth: Payer: Self-pay | Admitting: *Deleted

## 2019-02-10 NOTE — Telephone Encounter (Signed)
[facility] and relayed the message per Dr. Marylene Land about the pain medicine and that this would be the last refill. Misty Stanley

## 2019-02-10 NOTE — Telephone Encounter (Signed)
-----   Message from [place], [place] sent at 02/09/2019  4:08 PM EST ----- Let them know that she will be on pain medication until she gets her Ultrasound which is scheduled for 2/8. This refill should last her to next week and thenafter NO MORE REFILLS  ----- Message ----- From: Lanney Gins, [facility] Sent: 02/09/2019   3:59 PM EST To: Asencion Islam, DPM  Hey Dr Marylene Land, [facility] [address] stated that the patient was getting too many refills and just wanted to see how much longer the patient is going to be on it and for how much longer. [phone] and I will call if you tell me . Misty Stanley

## 2019-02-14 ENCOUNTER — Ambulatory Visit
Admission: RE | Admit: 2019-02-14 | Discharge: 2019-02-14 | Disposition: A | Discharge status: Home / Self Care | Payer: 59 | Source: Ambulatory Visit | Attending: Sports Medicine | Admitting: Sports Medicine

## 2019-02-14 DIAGNOSIS — M7661 Achilles tendinitis, right leg: Secondary | ICD-10-CM

## 2019-02-14 DIAGNOSIS — M792 Neuralgia and neuritis, unspecified: Secondary | ICD-10-CM

## 2019-02-14 DIAGNOSIS — Z09 Encounter for follow-up examination after completed treatment for conditions other than malignant neoplasm: Secondary | ICD-10-CM

## 2019-02-14 DIAGNOSIS — M7671 Peroneal tendinitis, right leg: Secondary | ICD-10-CM

## 2019-02-14 DIAGNOSIS — G8918 Other acute postprocedural pain: Secondary | ICD-10-CM

## 2019-02-14 DIAGNOSIS — R609 Edema, unspecified: Secondary | ICD-10-CM

## 2019-02-15 ENCOUNTER — Telehealth: Payer: Self-pay | Admitting: *Deleted

## 2019-02-15 ENCOUNTER — Other Ambulatory Visit: Payer: Self-pay | Admitting: Sports Medicine

## 2019-02-15 MED ORDER — IBUPROFEN 800 MG PO TABS: 800.0000 mg | ORAL_TABLET | Freq: Three times a day (TID) | ORAL | 0 refills | Status: DC | PRN

## 2019-02-15 NOTE — Telephone Encounter (Signed)
Patient is wanting to inform Dr.Stover that she completed her Ultra Sound on 02/14/19.She was diagnosed w/ swollen, stressed  Tendons.Should she use her scooter to calm the swelling for a week or so? She is also requesting refill (Hydromorphone-Dilaudid, 2mg ) for pain.

## 2019-02-15 NOTE — Progress Notes (Signed)
Sent Motrin for her pain Per Pharmacy can not continue to Rx narcotics for her pain MUST see pain management

## 2019-02-15 NOTE — Telephone Encounter (Signed)
I have spoken with the pharmacist and they will not continue to fill rx from me for her chronic pain. Patient will have to see a pain management doctor. Please refer her. The only thing that I can offer and have sent in is motrin to help meanwhile. Thank Dr. Marylene Land

## 2019-02-18 NOTE — Telephone Encounter (Signed)
Dr. Marylene Land:  I will work on this Monday. I texted Joya San regarding how to do this. She did sent me a text. Joya San will be out this coming Monday as well. Morrie Sheldon recommended patient to go to:  [facility] [address], [address] [place], [place] [address]  Patient lives in [place] so I could call her to find out if going to [place] will be too far. Joya San said I need to check on her insurance as well.  I hope you have a great weekend.  Thank you, Hannah Beat, CMA (AAMA)

## 2019-02-18 NOTE — Telephone Encounter (Signed)
Thank you :)

## 2019-02-21 NOTE — Progress Notes (Signed)
Faxed referral form to [facility] (pain management), with patient's last 3 office visit notes, last H&P, Problem list, Meds, and imaging report/ labs from last year. Everything faxed 02/21/19 at 2:09PM

## 2019-02-24 ENCOUNTER — Other Ambulatory Visit: Payer: Self-pay | Admitting: Family Medicine

## 2019-02-25 NOTE — Telephone Encounter (Signed)
No recent or future CPE or f/u appts scheduled, last filled on 01/28/18 #90 tabs with 1 refill, please advise

## 2019-03-01 ENCOUNTER — Encounter: Payer: Self-pay | Admitting: Family Medicine

## 2019-03-01 ENCOUNTER — Other Ambulatory Visit: Payer: Self-pay

## 2019-03-01 ENCOUNTER — Ambulatory Visit (INDEPENDENT_AMBULATORY_CARE_PROVIDER_SITE_OTHER): Payer: 59 | Admitting: Family Medicine

## 2019-03-01 ENCOUNTER — Telehealth: Payer: Self-pay | Admitting: Family Medicine

## 2019-03-01 VITALS — BP 142/79

## 2019-03-01 DIAGNOSIS — I10 Essential (primary) hypertension: Secondary | ICD-10-CM | POA: Diagnosis not present

## 2019-03-01 DIAGNOSIS — F1011 Alcohol abuse, in remission: Secondary | ICD-10-CM

## 2019-03-01 DIAGNOSIS — F418 Other specified anxiety disorders: Secondary | ICD-10-CM

## 2019-03-01 MED ORDER — SERTRALINE HCL 50 MG PO TABS: ORAL_TABLET | ORAL | 3 refills | Status: DC

## 2019-03-01 NOTE — Assessment & Plan Note (Signed)
bp is up a bit in the context of pain and anxiety  Will continue to monitor  May add tx if no imp with imp of mood

## 2019-03-01 NOTE — Assessment & Plan Note (Signed)
No etoh since the fall  Still working with AA and her sponsor

## 2019-03-01 NOTE — Progress Notes (Signed)
Virtual Visit via Video Note  I connected with Nicole Hughes on 03/01/19 at  9:00 AM EST by a video enabled telemedicine application and verified that I am speaking with the correct person using two identifiers.  Location: Patient: home Provider: office    I discussed the limitations of evaluation and management by telemedicine and the availability of in person appointments. The patient expressed understanding and agreed to proceed.  Parties involved in encounter  Patient: Nicole Hughes  Provider:  Loura Pardon MD    History of Present Illness: Pt presents for anxiety/depression   She moved into Hughes dad's house last may  His health is declining  (she worries about his memory) -has to take away his driving  Yolanda Bonine has a "hole in his heart"   Husband is no longer controlling Hughes life   Does not want to go back to drinking  Last drink was months ago (fell off the wagon once)  Has continued Hughes 12 step program - AA and online group work as well   Still takes mirtazapine 15 mg at night  zoloft used to work very well for Hughes - then it stopped (in the fall) after Hughes foot surgery Appetite is good  Has gained weight (was unable to walk) - she is now able to start walking    Would like a referral to see a counselor  She does talk to Hughes sponsor   Tendon surgery for foot Just got out of rehab for that but she is improved  No SI at all /never has   bp is up due to Hughes anxiety   Self care- 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 Insect bite 06/21/2018  . Pedal edema 03/31/2018  . Anemia 03/31/2018  . Appetite loss 03/08/2018  . Myofascial pain 03/08/2018  . IBS (irritable bowel syndrome) 03/08/2018  . Alcoholic hepatitis without ascites 03/05/2018  . Elevated LFTs 03/03/2018  . Achilles tendonitis 01/28/2018  . Abnormal cervical  Papanicolaou smear 09/29/2017  . Insomnia 09/29/2017  . Pain 09/29/2017  . Palpitations 05/25/2017  . Chest wall pain 05/25/2017  . Elevated transaminase level 05/25/2017  . Depression with anxiety 02/23/2017  . 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 Hyponatremia 02/17/2017  . Hypokalemia 09/24/2016  . H/O alcohol abuse 09/22/2016  . H/O acute pancreatitis 09/22/2016  . GERD (gastroesophageal reflux disease) 04/09/2015  . 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 Chest pain 08/20/2012  . Stress reaction 07/23/2012  . Hypertension 06/09/2012  . History of pulmonary embolism 06/09/2012  . Preop cardiovascular exam 06/08/2012  . Dysmenorrhea   . Dyspareunia   . Pelvic pain    Past Medical History:  Diagnosis Date  . Anxiety   . Cardiomyopathy ([place])   . Chronic lower back pain   . Complication of anesthesia    "hard to get under"  . Cyst of ovary   . Depression    hx  . DVT complicating pregnancy 02/7739   RLE; RUE  . Dysmenorrhea   . Dyspareunia   . 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 GERD (gastroesophageal reflux disease)   . Hay fever    "fall and spring" (02/17/2017)  . Headache    "2-3 times/.wik" (02/17/2017)  . History of chicken pox   . Hypertension   . Migraine    "monthly" (02/17/2017)  .  Pelvic pain   . Pulmonary embolism ([facility]) 08/1998   S/P childbirth   Past Surgical History:  Procedure Laterality Date  . CESAREAN SECTION  1994  . ECTOPIC PREGNANCY SURGERY  2010  . LAPAROSCOPY N/A 09/24/2012   Procedure: OPERATIVE LAPROSCOPY WITH LYSIS OF ADHESIONS;  Surgeon: Sherron Monday, MD;  Location: [facility];  Service: Gynecology;  Laterality: N/A;  . TONSILLECTOMY  1984  . TUBAL LIGATION  2010   Social History   Tobacco Use  . Smoking status: Never Smoker  . Smokeless tobacco: Never Used  Substance Use Topics  . Alcohol use: Not Currently    Alcohol/week: 5.0 standard drinks    Types: 5 Cans of beer per week    Comment: has quit (March 2019)  . Drug  use: No   Family History  Problem Relation Age of Onset  . Cancer - Lung Mother   . Heart failure Father   . Hypertension Father    No Known Allergies Current Outpatient Medications on File Prior to Visit  Medication Sig Dispense Refill  . amLODipine (NORVASC) 10 MG tablet Take 1 tablet (10 mg total) by mouth daily. 90 tablet 3  . gabapentin (NEURONTIN) 300 MG capsule Take 1 capsule (300 mg total) by mouth 4 (four) times daily. 90 capsule 3  . hydrOXYzine (ATARAX/VISTARIL) 25 MG tablet TAKE 1 TABLET BY MOUTH EVERY 8 HOURS AS NEEDED FOR ANXIETY OR NAUSEA 90 tablet 0  . ibuprofen (ADVIL) 800 MG tablet Take 1 tablet (800 mg total) by mouth every 8 (eight) hours as needed. 30 tablet 0  . lisinopril (PRINIVIL,ZESTRIL) 10 MG tablet Take 2 tablets (20 mg total) by mouth daily. 180 tablet 3  . metoprolol succinate (TOPROL-XL) 50 MG 24 hr tablet Take 1 tablet (50 mg total) by mouth daily. Take with or immediately following a meal. 90 tablet 3  . mirtazapine (REMERON) 15 MG tablet Take 1 tablet (15 mg total) by mouth at bedtime. 90 tablet 3  . Multiple Vitamin (MULTIVITAMIN WITH MINERALS) TABS tablet Take 1 tablet by mouth daily. 30 tablet 0  . omeprazole (PRILOSEC) 20 MG capsule Take 1 capsule (20 mg total) by mouth daily. 90 capsule 3   No current facility-administered medications on file prior to visit.    Review of Systems  Constitutional: Negative for chills, fever and malaise/fatigue.  HENT: Negative for congestion, ear pain, sinus pain and sore throat.   Eyes: Negative for blurred vision, discharge and redness.  Respiratory: Negative for cough, shortness of breath and stridor.   Cardiovascular: Negative for chest pain, palpitations and leg swelling.  Gastrointestinal: Negative for abdominal pain, diarrhea, nausea and vomiting.  Genitourinary:       Chronic pelvic pain  Musculoskeletal: Positive for joint pain. Negative for myalgias.       Recovering from tendon surgery in Hughes foot    Skin: Negative for rash.  Neurological: Negative for dizziness and headaches.  Psychiatric/Behavioral: Positive for depression. Negative for hallucinations, memory loss, substance abuse and suicidal ideas. The patient is nervous/anxious and has insomnia.     Observations/Objective: Patient appears well, in no distress Weight is baseline  No facial swelling or asymmetry Normal voice-not hoarse and no slurred speech No obvious tremor or mobility impairment Moving neck and UEs normally Able to hear the call well  No cough or shortness of breath during interview  Talkative and mentally sharp with no cognitive changes-good historian No skin changes on face or neck , no rash or pallor Affect is  depressed and tearful/ also anxious  (speaks candidly about stressors)  Speech is not pressured  Nl behavior/ no hallucinations or delusions  No SI at all   Assessment and Plan: Problem List Items Addressed This Visit      Cardiovascular and Mediastinum   Hypertension    bp is up a bit in the context of pain and anxiety  Will continue to monitor  May add tx if no imp with imp of mood        Other   H/O alcohol abuse    No etoh since the fall  Still working with AA and Hughes sponsor      Depression with anxiety - Primary    Worse with stressors- caring for father/worry about grandson and recovery from foot surgery  Also chronic pelvic pain- planning a hysterectomy  Reviewed stressors/ coping techniques/symptoms/ support sources/ tx options and side effects in detail today  Will be able to start walking with a boot Art is a coping mechanism Still goes to AA -not drinking  Continues mitrazapine for sleep/dep/appetite  Will add back sertraline and then titrate up to 100 in 2 week intervals  Disc poss side eff (she tolerated well in the past) Disc poss interaction with nsaids and mirtazapine- voiced awareness  Counseling referral made  No SI  Adv if symptoms become severe to go to [facility]  ER (she agreed)  F/u 4-6 wk virtually        Relevant Medications   sertraline (ZOLOFT) 50 MG tablet   Other Relevant Orders   Ambulatory referral to Psychology       Follow Up Instructions: Get started walking  I placed a counseling referral-the office will call you  Watch your blood pressure readings   Start sertraline back  1 pill daily for 2 weeks Then increase to 1 1/2 pills daily for 2 weeks Then increase to 2 pills daily for 2 weeks   Follow up for a virtual appt. In 4-6 weeks but call earlier if any problems  Continue AA and working with sponsor   I discussed the assessment and treatment plan with the patient. The patient was provided an opportunity to ask questions and all were answered. The patient agreed with the plan and demonstrated an understanding of the instructions.   The patient was advised to call back or seek an in-person evaluation if the symptoms worsen or if the condition fails to improve as anticipated.   Roxy Manns, MD

## 2019-03-01 NOTE — Telephone Encounter (Signed)
I left a detailed message on patient's voice mail to call back set up a virtual f/u for depression in 4-6 weeks.

## 2019-03-01 NOTE — Patient Instructions (Signed)
Get started walking  I placed a counseling referral-the office will call you  Watch your blood pressure readings   Start sertraline back  1 pill daily for 2 weeks Then increase to 1 1/2 pills daily for 2 weeks Then increase to 2 pills daily for 2 weeks   Follow up for a virtual appt. In 4-6 weeks but call earlier if any problems  Continue AA and working with sponsor

## 2019-03-01 NOTE — Assessment & Plan Note (Signed)
Worse with stressors- caring for father/worry about grandson and recovery from foot surgery  Also chronic pelvic pain- planning a hysterectomy  Reviewed stressors/ coping techniques/symptoms/ support sources/ tx options and side effects in detail today  Will be able to start walking with a boot Art is a coping mechanism Still goes to AA -not drinking  Continues mitrazapine for sleep/dep/appetite  Will add back sertraline and then titrate up to 100 in 2 week intervals  Disc poss side eff (she tolerated well in the past) Disc poss interaction with nsaids and mirtazapine- voiced awareness  Counseling referral made  No SI  Adv if symptoms become severe to go to [facility] ER (she agreed)  F/u 4-6 wk virtually

## 2019-04-11 ENCOUNTER — Other Ambulatory Visit: Payer: Self-pay

## 2019-04-11 ENCOUNTER — Emergency Department (HOSPITAL_COMMUNITY)
Admission: EM | Admit: 2019-04-11 | Discharge: 2019-04-11 | Disposition: A | Discharge status: Home / Self Care | Payer: 59 | Attending: Emergency Medicine | Admitting: Emergency Medicine

## 2019-04-11 ENCOUNTER — Emergency Department (HOSPITAL_COMMUNITY): Payer: 59

## 2019-04-11 DIAGNOSIS — Y907 Blood alcohol level of 200-239 mg/100 ml: Secondary | ICD-10-CM | POA: Insufficient documentation

## 2019-04-11 DIAGNOSIS — Z79899 Other long term (current) drug therapy: Secondary | ICD-10-CM | POA: Diagnosis not present

## 2019-04-11 DIAGNOSIS — I10 Essential (primary) hypertension: Secondary | ICD-10-CM | POA: Insufficient documentation

## 2019-04-11 DIAGNOSIS — R0789 Other chest pain: Secondary | ICD-10-CM | POA: Diagnosis not present

## 2019-04-11 DIAGNOSIS — Z86711 Personal history of pulmonary embolism: Secondary | ICD-10-CM | POA: Insufficient documentation

## 2019-04-11 DIAGNOSIS — F1010 Alcohol abuse, uncomplicated: Secondary | ICD-10-CM

## 2019-04-11 DIAGNOSIS — I429 Cardiomyopathy, unspecified: Secondary | ICD-10-CM | POA: Insufficient documentation

## 2019-04-11 DIAGNOSIS — M7918 Myalgia, other site: Secondary | ICD-10-CM | POA: Diagnosis present

## 2019-04-11 DIAGNOSIS — F10129 Alcohol abuse with intoxication, unspecified: Secondary | ICD-10-CM | POA: Insufficient documentation

## 2019-04-11 DIAGNOSIS — K219 Gastro-esophageal reflux disease without esophagitis: Secondary | ICD-10-CM | POA: Insufficient documentation

## 2019-04-11 LAB — RAPID URINE DRUG SCREEN, HOSP PERFORMED
Amphetamines: NOT DETECTED
Barbiturates: NOT DETECTED
Benzodiazepines: NOT DETECTED
Cocaine: NOT DETECTED
Opiates: NOT DETECTED
Tetrahydrocannabinol: POSITIVE — AB

## 2019-04-11 LAB — CBC WITH DIFFERENTIAL/PLATELET
Abs Immature Granulocytes: 0.02 10*3/uL (ref 0.00–0.07)
Basophils Absolute: 0.1 10*3/uL (ref 0.0–0.1)
Basophils Relative: 1 %
Eosinophils Absolute: 0.2 10*3/uL (ref 0.0–0.5)
Eosinophils Relative: 2 %
HCT: 29.8 % — ABNORMAL LOW (ref 36.0–46.0)
Hemoglobin: 9.3 g/dL — ABNORMAL LOW (ref 12.0–15.0)
Immature Granulocytes: 0 %
Lymphocytes Relative: 36 %
Lymphs Abs: 2.9 10*3/uL (ref 0.7–4.0)
MCH: 25.4 pg — ABNORMAL LOW (ref 26.0–34.0)
MCHC: 31.2 g/dL (ref 30.0–36.0)
MCV: 81.4 fL (ref 80.0–100.0)
Monocytes Absolute: 0.7 10*3/uL (ref 0.1–1.0)
Monocytes Relative: 9 %
Neutro Abs: 4.1 10*3/uL (ref 1.7–7.7)
Neutrophils Relative %: 52 %
Platelets: 247 10*3/uL (ref 150–400)
RBC: 3.66 MIL/uL — ABNORMAL LOW (ref 3.87–5.11)
RDW: 18.6 % — ABNORMAL HIGH (ref 11.5–15.5)
WBC: 7.8 10*3/uL (ref 4.0–10.5)
nRBC: 0 % (ref 0.0–0.2)

## 2019-04-11 LAB — BASIC METABOLIC PANEL
Anion gap: 11 (ref 5–15)
BUN: 13 mg/dL (ref 6–20)
CO2: 21 mmol/L — ABNORMAL LOW (ref 22–32)
Calcium: 8.6 mg/dL — ABNORMAL LOW (ref 8.9–10.3)
Chloride: 106 mmol/L (ref 98–111)
Creatinine, Ser: 0.76 mg/dL (ref 0.44–1.00)
GFR calc Af Amer: >60 mL/min (ref >60)
GFR calc non Af Amer: >60 mL/min (ref >60)
Glucose, Bld: 105 mg/dL — ABNORMAL HIGH (ref 70–99)
Potassium: 3.3 mmol/L — ABNORMAL LOW (ref 3.5–5.1)
Sodium: 138 mmol/L (ref 135–145)

## 2019-04-11 LAB — SALICYLATE LEVEL: Salicylate Lvl: <7 mg/dL — ABNORMAL LOW (ref 7.0–30.0)

## 2019-04-11 LAB — ETHANOL: Alcohol, Ethyl (B): 205 mg/dL — ABNORMAL HIGH (ref <10)

## 2019-04-11 LAB — ACETAMINOPHEN LEVEL: Acetaminophen (Tylenol), Serum: 12 ug/mL (ref 10–30)

## 2019-04-11 MED ORDER — ACETAMINOPHEN 325 MG PO TABS
650.0000 mg | ORAL_TABLET | Freq: Once | ORAL | Status: AC
  Administered 2019-04-11: 650 mg via ORAL
  Filled 2019-04-11: qty 2

## 2019-04-11 MED ORDER — CHLORDIAZEPOXIDE HCL 25 MG PO CAPS: ORAL_CAPSULE | ORAL | 0 refills | Status: DC

## 2019-04-11 MED ORDER — CHLORDIAZEPOXIDE HCL 25 MG PO CAPS
25.0000 mg | ORAL_CAPSULE | Freq: Once | ORAL | Status: AC
  Administered 2019-04-11: 25 mg via ORAL
  Filled 2019-04-11: qty 1

## 2019-04-11 MED ORDER — ONDANSETRON HCL 4 MG PO TABS
4.0000 mg | ORAL_TABLET | Freq: Once | ORAL | Status: AC
  Administered 2019-04-11: 4 mg via ORAL
  Filled 2019-04-11: qty 1

## 2019-04-11 NOTE — ED Provider Notes (Signed)
[facility]-EMERGENCY DEPT Provider Note   CSN: [id number] Arrival date & time: 04/11/19  1702     History Chief Complaint  Patient presents with  . Medical Clearance    Nicole Hughes is a 49 y.o. female.  58 female with prior medical history as detailed below presents for evaluation.  Patient reports that she has been drinking excessively for the last 3 months.  She has a plan to become sober over the next several days.  She has contact with alcoholics anonymous.  She complains of diffuse achy pain all over.  She is concerned that the excessive alcohol use has damaged her body.  She requests labs and evaluation determined that she has no physical damage from her alcohol consumption over the last several weeks.  The history is provided by the patient and medical records.  Illness Location:  Alcohol use and abuse Severity:  Mild Onset quality:  Gradual Duration:  4 months Timing:  Constant Progression:  Waxing and waning Chronicity:  New Associated symptoms: no fever        Past Medical History:  Diagnosis Date  . Anxiety   . Cardiomyopathy (HCC)   . Chronic lower back pain   . Complication of anesthesia    "hard to get under"  . Cyst of ovary   . Depression    hx  . DVT complicating pregnancy 08/1998   RLE; RUE  . Dysmenorrhea   . Dyspareunia   . 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 GERD (gastroesophageal reflux disease)   . Hay fever    "fall and spring" (02/17/2017)  . Headache    "2-3 times/.wik" (02/17/2017)  . History of chicken pox   . Hypertension   . Migraine    "monthly" (02/17/2017)  . Pelvic pain   . Pulmonary embolism (HCC) 08/1998   S/P childbirth    Patient Active Problem List   Diagnosis Date Noted  . Insect bite 06/21/2018  . Pedal edema 03/31/2018  . Anemia 03/31/2018  . Appetite loss 03/08/2018  . Myofascial pain 03/08/2018  . IBS (irritable bowel syndrome) 03/08/2018  . Alcoholic hepatitis without  ascites 03/05/2018  . Elevated LFTs 03/03/2018  . Achilles tendonitis 01/28/2018  . Abnormal cervical Papanicolaou smear 09/29/2017  . Insomnia 09/29/2017  . Pain 09/29/2017  . Palpitations 05/25/2017  . Chest wall pain 05/25/2017  . Elevated transaminase level 05/25/2017  . Depression with anxiety 02/23/2017  . 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 Hyponatremia 02/17/2017  . Hypokalemia 09/24/2016  . H/O alcohol abuse 09/22/2016  . H/O acute pancreatitis 09/22/2016  . GERD (gastroesophageal reflux disease) 04/09/2015  . 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 Chest pain 08/20/2012  . Stress reaction 07/23/2012  . Hypertension 06/09/2012  . History of pulmonary embolism 06/09/2012  . Preop cardiovascular exam 06/08/2012  . Dysmenorrhea   . Dyspareunia   . Pelvic pain     Past Surgical History:  Procedure Laterality Date  . CESAREAN SECTION  1994  . ECTOPIC PREGNANCY SURGERY  2010  . LAPAROSCOPY N/A 09/24/2012   Procedure: OPERATIVE LAPROSCOPY WITH LYSIS OF ADHESIONS;  Surgeon: Sherron Monday, MD;  Location: [facility];  Service: Gynecology;  Laterality: N/A;  . TONSILLECTOMY  1984  . TUBAL LIGATION  2010     OB History   No obstetric history on file.     Family History  Problem Relation Age of Onset  . Cancer - Lung Mother   . Heart failure Father   .  Hypertension Father     Social History   Tobacco Use  . Smoking status: Never Smoker  . Smokeless tobacco: Never Used  Substance Use Topics  . Alcohol use: Not Currently    Alcohol/week: 5.0 standard drinks    Types: 5 Cans of beer per week    Comment: has quit (March 2019)  . Drug use: No    Home Medications Prior to Admission medications   Medication Sig Start Date End Date Taking? Authorizing Provider  amLODipine (NORVASC) 10 MG tablet Take 1 tablet (10 mg total) by mouth daily. 03/31/18  Yes Tower, Wynelle Fanny, MD  gabapentin (NEURONTIN) 300 MG capsule Take 1 capsule (300 mg total) by mouth 4 (four) times  daily. Patient taking differently: Take 300 mg by mouth 4 (four) times daily as needed (pain).  11/30/18  Yes Stover, Titorya, DPM  hydrOXYzine (ATARAX/VISTARIL) 25 MG tablet TAKE 1 TABLET BY MOUTH EVERY 8 HOURS AS NEEDED FOR ANXIETY OR NAUSEA Patient taking differently: Take 25 mg by mouth every 8 (eight) hours as needed for anxiety.  02/25/19  Yes Tower, Wynelle Fanny, MD  ibuprofen (ADVIL) 800 MG tablet Take 1 tablet (800 mg total) by mouth every 8 (eight) hours as needed. Patient taking differently: Take 800 mg by mouth every 8 (eight) hours as needed for headache or moderate pain.  02/15/19  Yes Stover, Titorya, DPM  lisinopril (PRINIVIL,ZESTRIL) 10 MG tablet Take 2 tablets (20 mg total) by mouth daily. 03/31/18  Yes Tower, Wynelle Fanny, MD  metoprolol succinate (TOPROL-XL) 50 MG 24 hr tablet Take 1 tablet (50 mg total) by mouth daily. Take with or immediately following a meal. 03/31/18  Yes Tower, Wynelle Fanny, MD  mirtazapine (REMERON) 15 MG tablet Take 1 tablet (15 mg total) by mouth at bedtime. 03/31/18  Yes Tower, Wynelle Fanny, MD  Multiple Vitamin (MULTIVITAMIN WITH MINERALS) TABS tablet Take 1 tablet by mouth daily. 02/19/17  Yes Dana Allan I, MD  omeprazole (PRILOSEC) 20 MG capsule Take 1 capsule (20 mg total) by mouth daily. 03/31/18  Yes Tower, Wynelle Fanny, MD  sertraline (ZOLOFT) 50 MG tablet Take one pill daily by mouth for 2 weeks, then increase to 1 1/2 pills daily for 2 weeks , then 2 pills daily Patient taking differently: Take 100 mg by mouth daily.  03/01/19  Yes Tower, Wynelle Fanny, MD  chlordiazePOXIDE (LIBRIUM) 25 MG capsule 50mg  PO TID x 1D, then 25-50mg  PO BID X 1D, then 25-50mg  PO QD X 1D 04/11/19   Valarie Merino, MD    Allergies    Patient has no known allergies.  Review of Systems   Review of Systems  Constitutional: Negative for fever.  All other systems reviewed and are negative.   Physical Exam Updated Vital Signs BP (!) 141/87 (BP Location: Left Arm)   Pulse 84   Temp 98.8 F (37.1  C) (Oral)   Resp 20   Ht 5\' 5"  (1.651 m)   Wt 71.7 kg   LMP 04/04/2019 (Approximate)   SpO2 99%   BMI 26.29 kg/m   Physical Exam Vitals and nursing note reviewed.  Constitutional:      General: She is not in acute distress.    Appearance: Normal appearance. She is well-developed.  HENT:     Head: Normocephalic and atraumatic.  Eyes:     Conjunctiva/sclera: Conjunctivae normal.     Pupils: Pupils are equal, round, and reactive to light.  Cardiovascular:     Rate and Rhythm: Normal rate  and regular rhythm.     Heart sounds: Normal heart sounds.  Pulmonary:     Effort: Pulmonary effort is normal. No respiratory distress.     Breath sounds: Normal breath sounds.  Abdominal:     General: There is no distension.     Palpations: Abdomen is soft.     Tenderness: There is no abdominal tenderness.  Musculoskeletal:        General: No deformity. Normal range of motion.     Cervical back: Normal range of motion and neck supple.  Skin:    General: Skin is warm and dry.  Neurological:     General: No focal deficit present.     Mental Status: She is alert and oriented to person, place, and time. Mental status is at baseline.     ED Results / Procedures / Treatments   Labs (all labs ordered are listed, but only abnormal results are displayed) Labs Reviewed  BASIC METABOLIC PANEL - Abnormal; Notable for the following components:      Result Value   Potassium 3.3 (*)    CO2 21 (*)    Glucose, Bld 105 (*)    Calcium 8.6 (*)    All other components within normal limits  ETHANOL - Abnormal; Notable for the following components:   Alcohol, Ethyl (B) 205 (*)    All other components within normal limits  SALICYLATE LEVEL - Abnormal; Notable for the following components:   Salicylate Lvl <7.0 (*)    All other components within normal limits  CBC WITH DIFFERENTIAL/PLATELET - Abnormal; Notable for the following components:   RBC 3.66 (*)    Hemoglobin 9.3 (*)    HCT 29.8 (*)    MCH  25.4 (*)    RDW 18.6 (*)    All other components within normal limits  RAPID URINE DRUG SCREEN, HOSP PERFORMED - Abnormal; Notable for the following components:   Tetrahydrocannabinol POSITIVE (*)    All other components within normal limits  ACETAMINOPHEN LEVEL    EKG EKG Interpretation  Date/Time:  Monday April 11 2019 18:42:01 EDT Ventricular Rate:  88 PR Interval:  146 QRS Duration: 84 QT Interval:  382 QTC Calculation: 462 R Axis:   51 Text Interpretation: Normal sinus rhythm Normal ECG Confirmed by Kristine Royal [id number]) on 04/11/2019 6:45:46 PM   Radiology DG Chest Port 1 View  Result Date: 04/11/2019 CLINICAL DATA:  Back and chest pain, 3 month relapse on alcohol, history cardiomyopathy, GERD, hypertension EXAM: PORTABLE CHEST 1 VIEW COMPARISON:  Portable exam 1828 hours without priors for comparison FINDINGS: Normal heart size, mediastinal contours, and pulmonary vascularity. Lungs clear. No pleural effusion or pneumothorax. Bones unremarkable. IMPRESSION: Normal exam. Electronically Signed   By: Ulyses Southward M.D.   On: 04/11/2019 18:55    Procedures Procedures (including critical care time)  Medications Ordered in ED Medications  chlordiazePOXIDE (LIBRIUM) capsule 25 mg (has no administration in time range)    ED Course  I have reviewed the triage vital signs and the nursing notes.  Pertinent labs & imaging results that were available during my care of the patient were reviewed by me and considered in my medical decision making (see chart for details).    MDM Rules/Calculators/A&P                      MDM  Screen complete  MIKAIAH STOFFER was evaluated in Emergency Department on 04/11/2019 for the symptoms described in the history of present  illness. She was evaluated in the context of the global COVID-19 pandemic, which necessitated consideration that the patient might be at risk for infection with the SARS-CoV-2 virus that causes COVID-19. Institutional  protocols and algorithms that pertain to the evaluation of patients at risk for COVID-19 are in a state of rapid change based on information released by regulatory bodies including the CDC and federal and state organizations. These policies and algorithms were followed during the patient's care in the ED.   Patient is presenting for evaluation.  Patient reports significant and excessive alcohol consumption over the last several months.  She is concerned that her alcohol abuse is damaged her body.  Patient is without specific findings on exam that are suggestive of significant acute pathology.  Screening labs are without significant abnormality.  Patient does appear to be appropriate for discharge.  Strict return precautions given and understood.   Final Clinical Impression(s) / ED Diagnoses Final diagnoses:  Alcohol abuse    Rx / DC Orders ED Discharge Orders         Ordered    chlordiazePOXIDE (LIBRIUM) 25 MG capsule     04/11/19 2109           Wynetta Fines, MD 04/11/19 2112

## 2019-04-11 NOTE — ED Triage Notes (Signed)
Pt is here after a three month relapse on alcohol.  She is very scared and tearful stating that she is having back and chest pain as well.  States " I am scared I have damaged my body so bad that I am going to have a heart attack"

## 2019-04-11 NOTE — Discharge Instructions (Signed)
Return for any problem.  Follow-up with your regular care provider as instructed.  Drink alcohol in moderation.  Use Librium taper as prescribed instead of drinking alcohol.  It is important to not drink alcohol and take Librium at the same time.

## 2019-04-11 NOTE — Progress Notes (Signed)
Received Nicole Hughes this PM awake in her room without distress. Later she verbalized several c/o discomfort. Mid back pain, chest pain and feeling nausea. She was medicated per order. Dr. Rodena Medin arrived to talk with her related to her treatment and discharge. She was presented with her AVS, questions answered.  She called a relative and was discharged at 2255 with her personal belongings.

## 2019-04-12 ENCOUNTER — Other Ambulatory Visit: Payer: Self-pay | Admitting: Family Medicine

## 2019-04-12 DIAGNOSIS — R1012 Left upper quadrant pain: Secondary | ICD-10-CM

## 2019-04-13 ENCOUNTER — Other Ambulatory Visit: Payer: Self-pay | Admitting: Family Medicine

## 2019-04-15 ENCOUNTER — Telehealth: Payer: Self-pay

## 2019-04-15 MED ORDER — CHLORDIAZEPOXIDE HCL 25 MG PO CAPS: ORAL_CAPSULE | ORAL | 0 refills | Status: DC

## 2019-04-15 NOTE — Telephone Encounter (Signed)
Caution of sedation and habit with this  I wrote for 1-2 pills bid for 2 days then 1-2 pills once daily  Goal is to taper off   Follow up as planned

## 2019-04-15 NOTE — Telephone Encounter (Signed)
Patient l/m on triage line asking if Dr Milinda Antis can refill her Librium that was started by ER doctor on 04/11/19 for alcohol detox. Patient has an appointment with Dr Milinda Antis on 04/18/19 and she will not have enough medication to last. Patient states she has been taking Ibuprofen/Tylenol but it does not help. Patient states going through the detox is quite painful. Please review. Patient states it says to take 1 to 2 tablets once daily.

## 2019-04-15 NOTE — Telephone Encounter (Signed)
Left VM letting pt know Rx was sent in and I advised pt of Dr. Royden Purl comments on VM

## 2019-04-18 ENCOUNTER — Other Ambulatory Visit: Payer: Self-pay

## 2019-04-18 ENCOUNTER — Telehealth: Payer: 59 | Admitting: Family Medicine

## 2019-04-25 ENCOUNTER — Other Ambulatory Visit: Payer: Self-pay | Admitting: Family Medicine

## 2019-04-26 NOTE — Telephone Encounter (Signed)
Pt no-showed her doxy to discuss her alcohol abuse, please advise

## 2019-04-26 NOTE — Telephone Encounter (Signed)
Please decline She no showed for her visit

## 2019-04-27 ENCOUNTER — Other Ambulatory Visit: Payer: Self-pay | Admitting: Family Medicine

## 2019-04-28 ENCOUNTER — Telehealth: Payer: Self-pay

## 2019-04-28 ENCOUNTER — Other Ambulatory Visit: Payer: Self-pay | Admitting: Family Medicine

## 2019-04-28 ENCOUNTER — Telehealth: Payer: 59 | Admitting: Family Medicine

## 2019-04-28 NOTE — Telephone Encounter (Signed)
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 Pt could not come to office this morning for in office ED FU;pt scheduled 30' in office appt on 04/29/19 at 3:15.Pt said she is an alcoholic and just started online AA meetings. No SI/HI and no withdrawal symptoms per pt. Today BP 132/79 T 97.6. ED precautions given and pt voiced understanding.Pt has no covid symptoms, no travel and no known exposure to + covid. FYI to Dr Milinda Antis.

## 2019-04-28 NOTE — Telephone Encounter (Signed)
I will see her then  

## 2019-04-29 ENCOUNTER — Other Ambulatory Visit: Payer: Self-pay

## 2019-04-29 ENCOUNTER — Ambulatory Visit: Payer: 59 | Admitting: Family Medicine

## 2019-04-29 ENCOUNTER — Encounter: Payer: Self-pay | Admitting: Family Medicine

## 2019-04-29 VITALS — BP 148/80 | HR 98 | Temp 98.4°F | Ht 65.0 in | Wt 159.3 lb

## 2019-04-29 DIAGNOSIS — M7918 Myalgia, other site: Secondary | ICD-10-CM | POA: Diagnosis not present

## 2019-04-29 DIAGNOSIS — F1010 Alcohol abuse, uncomplicated: Secondary | ICD-10-CM | POA: Diagnosis not present

## 2019-04-29 DIAGNOSIS — F418 Other specified anxiety disorders: Secondary | ICD-10-CM | POA: Diagnosis not present

## 2019-04-29 DIAGNOSIS — D649 Anemia, unspecified: Secondary | ICD-10-CM | POA: Diagnosis not present

## 2019-04-29 MED ORDER — NALTREXONE HCL 50 MG PO TABS: 50.0000 mg | ORAL_TABLET | Freq: Every day | ORAL | 3 refills | Status: DC

## 2019-04-29 MED ORDER — HYDROXYZINE HCL 25 MG PO TABS: 25.0000 mg | ORAL_TABLET | Freq: Three times a day (TID) | ORAL | 0 refills | Status: DC | PRN

## 2019-04-29 NOTE — Progress Notes (Signed)
Subjective:    Patient ID: Nicole Hughes, female    DOB: 1970/08/27, 49 y.o.   MRN: [id number]  This visit occurred during the SARS-CoV-2 public health emergency.  Safety protocols were in place, including screening questions prior to the visit, additional usage of staff PPE, and extensive cleaning of exam room while observing appropriate contact time as indicated for disinfecting solutions.    HPI Pt presents for ER f/u for etoh withdrawal  Wt Readings from Last 3 Encounters:  04/29/19 159 lb 5 oz (72.3 kg)  04/11/19 158 lb (71.7 kg)  10/12/18 162 lb (73.5 kg)   26.51 kg/m   She ws seen on 4/5 in ED for etoh problems and withdrawal At that time sited excessive intake for 3 mo  Had planned to become sober  Did reach out to AA  C/o diffuse achey pain all over  Was interested in lab work   Labs: Results for orders placed or performed during the hospital encounter of 04/11/19  Basic metabolic panel  Result Value Ref Range   Sodium 138 135 - 145 mmol/L   Potassium 3.3 (L) 3.5 - 5.1 mmol/L   Chloride 106 98 - 111 mmol/L   CO2 21 (L) 22 - 32 mmol/L   Glucose, Bld 105 (H) 70 - 99 mg/dL   BUN 13 6 - 20 mg/dL   Creatinine, Ser 5.46 0.44 - 1.00 mg/dL   Calcium 8.6 (L) 8.9 - 10.3 mg/dL   GFR calc non Af Amer >60 >60 mL/min   GFR calc Af Amer >60 >60 mL/min   Anion gap 11 5 - 15  Ethanol  Result Value Ref Range   Alcohol, Ethyl (B) 205 (H) <10 mg/dL  Acetaminophen level  Result Value Ref Range   Acetaminophen (Tylenol), Serum 12 10 - 30 ug/mL  Salicylate level  Result Value Ref Range   Salicylate Lvl <7.0 (L) 7.0 - 30.0 mg/dL  CBC with Differential  Result Value Ref Range   WBC 7.8 4.0 - 10.5 K/uL   RBC 3.66 (L) 3.87 - 5.11 MIL/uL   Hemoglobin 9.3 (L) 12.0 - 15.0 g/dL   HCT 56.8 (L) 12.7 - 51.7 %   MCV 81.4 80.0 - 100.0 fL   MCH 25.4 (L) 26.0 - 34.0 pg   MCHC 31.2 30.0 - 36.0 g/dL   RDW 00.1 (H) 74.9 - 44.9 %   Platelets 247 150 - 400 K/uL   nRBC 0.0 0.0 - 0.2 %     Neutrophils Relative % 52 %   Neutro Abs 4.1 1.7 - 7.7 K/uL   Lymphocytes Relative 36 %   Lymphs Abs 2.9 0.7 - 4.0 K/uL   Monocytes Relative 9 %   Monocytes Absolute 0.7 0.1 - 1.0 K/uL   Eosinophils Relative 2 %   Eosinophils Absolute 0.2 0.0 - 0.5 K/uL   Basophils Relative 1 %   Basophils Absolute 0.1 0.0 - 0.1 K/uL   Immature Granulocytes 0 %   Abs Immature Granulocytes 0.02 0.00 - 0.07 K/uL  Rapid urine drug screen (hospital performed)  Result Value Ref Range   Opiates NONE DETECTED NONE DETECTED   Cocaine NONE DETECTED NONE DETECTED   Benzodiazepines NONE DETECTED NONE DETECTED   Amphetamines NONE DETECTED NONE DETECTED   Tetrahydrocannabinol POSITIVE (A) NONE DETECTED   Barbiturates NONE DETECTED NONE DETECTED    Lab Results  Component Value Date   ALT 39 09/12/2018   AST 39 09/12/2018   ALKPHOS 36 (L) 09/12/2018   BILITOT  0.4 09/12/2018     EKG NSR rate of 88  CXR DG Chest Port 1 View  Result Date: 04/11/2019 CLINICAL DATA:  Back and chest pain, 3 month relapse on alcohol, history cardiomyopathy, GERD, hypertension EXAM: PORTABLE CHEST 1 VIEW COMPARISON:  Portable exam 1828 hours without priors for comparison FINDINGS: Normal heart size, mediastinal contours, and pulmonary vascularity. Lungs clear. No pleural effusion or pneumothorax. Bones unremarkable. IMPRESSION: Normal exam. Electronically Signed   By: Ulyses SouthwardMark  Boles M.D.   On: 04/11/2019 18:55     She was px librium for help with withdrawal    Today:  BP Readings from Last 3 Encounters:  04/29/19 (!) 156/84  04/11/19 (!) 149/91  03/01/19 (!) 142/79   Pulse Readings from Last 3 Encounters:  04/29/19 98  04/11/19 81  10/12/18 84   She started AA on line -helpful  Finding self worth Making amends  Standard 12 step process She will get a sponsor this week   Stress is high-father is more sicker (loosing his memory)  Still lives with her dad Still married but not living with husband (he drinks  socially)    When she stopped her pain med for foot -started drinking again  She got up to a 5th per day  Her last drink was Saturday (because it was in the house) -she is no longer keeping etoh in the house  Had 4 shots of alcohol on Saturday   Withdrawal through the week - shaky and chest pain  Aches in her back  Sweating  Nausea/vomiting   (her father helped take care of her)    Librium helps tremendously    2 periods in 3 weeks  Planning hysterectomy - after she pays money owned - will get that soon  Stays anemic   Also feel and re injured foot (one that had surgery in august)   Has had alcoholic hepatitis in the past No symptoms of that today   [facility] [employer]  Knowledgeable about substance disorders   Does not think she needs intensive outpatient treatment   Taking a multi vitamin   meds Vistaril 25mg - helps with nausea and anxiety  Gabapentin  mirtazpine at night-helps sleep  Takes 100 mg zoloft daily (inc to 100 three weeks ago )  Would be open to psychiatry ref   Patient Active Problem List   Diagnosis Date Noted  . Insect bite 06/21/2018  . Pedal edema 03/31/2018  . Anemia 03/31/2018  . Appetite loss 03/08/2018  . Myofascial pain 03/08/2018  . IBS (irritable bowel syndrome) 03/08/2018  . Alcoholic hepatitis without ascites 03/05/2018  . Elevated LFTs 03/03/2018  . Achilles tendonitis 01/28/2018  . Abnormal cervical Papanicolaou smear 09/29/2017  . Insomnia 09/29/2017  . Pain 09/29/2017  . Palpitations 05/25/2017  . Chest wall pain 05/25/2017  . Elevated transaminase level 05/25/2017  . Depression with anxiety 02/23/2017  . 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 Hyponatremia 02/17/2017  . Hypokalemia 09/24/2016  . Alcohol abuse 09/22/2016  . H/O acute pancreatitis 09/22/2016  . GERD (gastroesophageal reflux disease) 04/09/2015  . 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 Chest pain 08/20/2012  . Stress reaction 07/23/2012  .  Hypertension 06/09/2012  . History of pulmonary embolism 06/09/2012  . Preop cardiovascular exam 06/08/2012  . Dysmenorrhea   . Dyspareunia   . Pelvic pain    Past Medical History:  Diagnosis Date  . Anxiety   . Cardiomyopathy (HCC)   . Chronic lower back pain   .  Complication of anesthesia    "hard to get under"  . Cyst of ovary   . Depression    hx  . DVT complicating pregnancy 08/1998   RLE; RUE  . Dysmenorrhea   . Dyspareunia   . 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 GERD (gastroesophageal reflux disease)   . Hay fever    "fall and spring" (02/17/2017)  . Headache    "2-3 times/.wik" (02/17/2017)  . History of chicken pox   . Hypertension   . Migraine    "monthly" (02/17/2017)  . Pelvic pain   . Pulmonary embolism (HCC) 08/1998   S/P childbirth   Past Surgical History:  Procedure Laterality Date  . CESAREAN SECTION  1994  . ECTOPIC PREGNANCY SURGERY  2010  . LAPAROSCOPY N/A 09/24/2012   Procedure: OPERATIVE LAPROSCOPY WITH LYSIS OF ADHESIONS;  Surgeon: Sherron Monday, MD;  Location: [facility];  Service: Gynecology;  Laterality: N/A;  . TONSILLECTOMY  1984  . TUBAL LIGATION  2010   Social History   Tobacco Use  . Smoking status: Never Smoker  . Smokeless tobacco: Never Used  Substance Use Topics  . Alcohol use: Not Currently    Alcohol/week: 5.0 standard drinks    Types: 5 Cans of beer per week    Comment: has quit (March 2019)  . Drug use: No   Family History  Problem Relation Age of Onset  . Cancer - Lung Mother   . Heart failure Father   . Hypertension Father    No Known Allergies Current Outpatient Medications on File Prior to Visit  Medication Sig Dispense Refill  . amLODipine (NORVASC) 10 MG tablet Take 1 tablet (10 mg total) by mouth daily. 90 tablet 3  . gabapentin (NEURONTIN) 300 MG capsule Take 1 capsule (300 mg total) by mouth 4 (four) times daily. (Patient taking differently: Take 300 mg by mouth 4 (four) times daily as needed (pain). )  90 capsule 3  . ibuprofen (ADVIL) 800 MG tablet Take 1 tablet (800 mg total) by mouth every 8 (eight) hours as needed. (Patient taking differently: Take 800 mg by mouth every 8 (eight) hours as needed for headache or moderate pain. ) 30 tablet 0  . lisinopril (PRINIVIL,ZESTRIL) 10 MG tablet Take 2 tablets (20 mg total) by mouth daily. 180 tablet 3  . metoprolol succinate (TOPROL-XL) 50 MG 24 hr tablet TAKE 1 TABLET BY MOUTH ONCE DAILY(TAKE WITH OR IMMEDIATELY FOLLOWING A MEAL) 90 tablet 0  . mirtazapine (REMERON) 15 MG tablet Take 1 tablet (15 mg total) by mouth at bedtime. 90 tablet 3  . Multiple Vitamin (MULTIVITAMIN WITH MINERALS) TABS tablet Take 1 tablet by mouth daily. 30 tablet 0  . omeprazole (PRILOSEC) 20 MG capsule Take 1 capsule by mouth once daily 90 capsule 1  . sertraline (ZOLOFT) 50 MG tablet Take one pill daily by mouth for 2 weeks, then increase to 1 1/2 pills daily for 2 weeks , then 2 pills daily (Patient taking differently: Take 100 mg by mouth daily. ) 60 tablet 3   No current facility-administered medications on file prior to visit.    Review of Systems  Constitutional: Negative for activity change, appetite change, fatigue, fever and unexpected weight change.  HENT: Negative for congestion, ear pain, rhinorrhea, sinus pressure and sore throat.   Eyes: Negative for pain, redness and visual disturbance.  Respiratory: Negative for cough, shortness of breath and wheezing.   Cardiovascular: Negative for chest pain and palpitations.  Gastrointestinal: Negative for  abdominal pain, blood in stool, constipation and diarrhea.  Endocrine: Negative for polydipsia and polyuria.  Genitourinary: Positive for menstrual problem and pelvic pain. Negative for dysuria, frequency and urgency.       Chronic pelvic pain and heavy menses with endometriosis  Musculoskeletal: Negative for arthralgias, back pain and myalgias.  Skin: Negative for pallor and rash.  Allergic/Immunologic: Negative for  environmental allergies.  Neurological: Negative for dizziness, syncope and headaches.  Hematological: Negative for adenopathy. Does not bruise/bleed easily.  Psychiatric/Behavioral: Positive for decreased concentration, dysphoric mood and sleep disturbance. Negative for hallucinations, self-injury and suicidal ideas. The patient is nervous/anxious.        Objective:   Physical Exam Constitutional:      General: She is not in acute distress.    Appearance: Normal appearance. She is normal weight. She is not ill-appearing or diaphoretic.  Eyes:     General: No scleral icterus.    Conjunctiva/sclera: Conjunctivae normal.     Pupils: Pupils are equal, round, and reactive to light.  Neck:     Vascular: No carotid bruit.  Cardiovascular:     Rate and Rhythm: Regular rhythm. Tachycardia present.     Pulses: Normal pulses.     Heart sounds: Normal heart sounds. No murmur.  Pulmonary:     Effort: Pulmonary effort is normal. No respiratory distress.     Breath sounds: Normal breath sounds. No wheezing or rales.  Abdominal:     General: Abdomen is flat. Bowel sounds are normal. There is no distension or abdominal bruit.     Palpations: Abdomen is soft. There is no shifting dullness, fluid wave, hepatomegaly, splenomegaly, mass or pulsatile mass.     Comments: Suprapubic tenderness (baseline)  Musculoskeletal:     Cervical back: Normal range of motion and neck supple.     Right lower leg: No edema.     Left lower leg: No edema.  Lymphadenopathy:     Cervical: No cervical adenopathy.  Skin:    General: Skin is warm and dry.     Coloration: Skin is not pale.     Findings: No erythema or rash.  Neurological:     Mental Status: She is alert.     Sensory: No sensory deficit.     Motor: Tremor present.     Coordination: Coordination normal.     Deep Tendon Reflexes: Reflexes normal.     Comments: bilat hand tremor   Psychiatric:        Attention and Perception: Attention normal.         Mood and Affect: Mood is anxious and depressed.        Speech: Speech normal.        Behavior: Behavior normal.        Cognition and Memory: Cognition and memory normal.     Comments: Less tearful than usual           Assessment & Plan:   Problem List Items Addressed This Visit      Other   Alcohol abuse - Primary    Reviewed recent ED notes  Reviewed hospital records, lab results and studies in detail   Pt had last drink on Saturday and was up to a fifth per day per her report Liver labs today  In AA currently -reviewed goals for her 12 step program Librium helped acute withdrawl -pt did not have seizures and was able to go through at home  Interested in medication to help craving- px naltrexone an  disc potential side eff Will continue current medications (refilled hydroxyzine)  Declines inpatient or outpatient rehab at this time  Psychiatry ref placed for help with anxiety and depression       Relevant Orders   Hepatic function panel (Completed)   Depression with anxiety    Pt currently takes sertraline, mirtazapine and hydroxyzine She is still struggling with mood  Reviewed stressors/ coping techniques/symptoms/ support sources/ tx options and side effects in detail today Recent withdraw from etoh (also h/o narcotic abuse)  No SI today Ref made for psychiatry-pt promised she would go  Also going to AA (online) as well as counseling at her church      Relevant Medications   hydrOXYzine (ATARAX/VISTARIL) 25 MG tablet   Other Relevant Orders   Ambulatory referral to Psychiatry   Myofascial pain   Anemia    Suspect both anemia of chronic disease and from heavy menses  Will add iron to her mvi and watch  Is pursuing a hysterectomy for endometriosis

## 2019-04-29 NOTE — Patient Instructions (Addendum)
Our office will call you about the psychiatry referral   I refilled the vistaril  Take other medicines as prescribed   Start the naltrexone 50 mg daily   Liver labs today  Follow up with me in 1-2 months

## 2019-04-30 LAB — HEPATIC FUNCTION PANEL
AG Ratio: 1.8 (calc) (ref 1.0–2.5)
ALT: 50 U/L — ABNORMAL HIGH (ref 6–29)
AST: 55 U/L — ABNORMAL HIGH (ref 10–35)
Albumin: 4.6 g/dL (ref 3.6–5.1)
Alkaline phosphatase (APISO): 43 U/L (ref 31–125)
Bilirubin, Direct: 0.1 mg/dL (ref 0.0–0.2)
Globulin: 2.6 g/dL (calc) (ref 1.9–3.7)
Indirect Bilirubin: 0.4 mg/dL (calc) (ref 0.2–1.2)
Total Bilirubin: 0.5 mg/dL (ref 0.2–1.2)
Total Protein: 7.2 g/dL (ref 6.1–8.1)

## 2019-04-30 NOTE — Assessment & Plan Note (Addendum)
Pt currently takes sertraline, mirtazapine and hydroxyzine She is still struggling with mood  Reviewed stressors/ coping techniques/symptoms/ support sources/ tx options and side effects in detail today Recent withdraw from etoh (also h/o narcotic abuse)  No SI today Ref made for psychiatry-pt promised she would go  Also going to AA (online) as well as counseling at her church

## 2019-04-30 NOTE — Assessment & Plan Note (Signed)
Suspect both anemia of chronic disease and from heavy menses  Will add iron to her mvi and watch  Is pursuing a hysterectomy for endometriosis

## 2019-04-30 NOTE — Assessment & Plan Note (Addendum)
Reviewed recent ED notes  Reviewed hospital records, lab results and studies in detail   Pt had last drink on Saturday and was up to a fifth per day per her report Liver labs today  In AA currently -reviewed goals for her 12 step program Librium helped acute withdrawl -pt did not have seizures and was able to go through at home  Interested in medication to help craving- px naltrexone an disc potential side eff Will continue current medications (refilled hydroxyzine)  Declines inpatient or outpatient rehab at this time  Psychiatry ref placed for help with anxiety and depression

## 2019-05-01 ENCOUNTER — Other Ambulatory Visit: Payer: Self-pay | Admitting: Family Medicine

## 2019-05-02 NOTE — Telephone Encounter (Signed)
Refill request Mirtazapine Last refill 03/31/18 #90/3 Last office visit 04/29/19

## 2019-05-17 ENCOUNTER — Telehealth: Payer: Self-pay

## 2019-05-17 NOTE — Telephone Encounter (Signed)
NOTES ON FILE FROM [facility] [phone] SENT REFERRAL TO SCHEDULING

## 2019-05-27 ENCOUNTER — Telehealth: Payer: Self-pay | Admitting: Interventional Cardiology

## 2019-05-27 ENCOUNTER — Ambulatory Visit
Admission: EM | Admit: 2019-05-27 | Discharge: 2019-05-27 | Disposition: A | Discharge status: Home / Self Care | Payer: 59 | Attending: Emergency Medicine | Admitting: Emergency Medicine

## 2019-05-27 DIAGNOSIS — R079 Chest pain, unspecified: Secondary | ICD-10-CM

## 2019-05-27 MED ORDER — ALUM & MAG HYDROXIDE-SIMETH 200-200-20 MG/5ML PO SUSP
30.0000 mL | Freq: Once | ORAL | Status: AC
  Administered 2019-05-27: 30 mL via ORAL

## 2019-05-27 MED ORDER — LIDOCAINE VISCOUS HCL 2 % MT SOLN
15.0000 mL | Freq: Once | OROMUCOSAL | Status: AC
  Administered 2019-05-27: 15 mL via ORAL

## 2019-05-27 MED ORDER — KETOROLAC TROMETHAMINE 30 MG/ML IJ SOLN: 30.0000 mg | Freq: Once | INTRAMUSCULAR | Status: DC

## 2019-05-27 NOTE — ED Triage Notes (Signed)
Pt c/o lt sided chest pain radiating up to neck off and on since last night. Denies SOB, nausea, or diaphoresis. States she is a recovering alcoholic, last drink 05/07/2019. States has had this before and dx with anxiety. States this feels different. No acute distress noted at this time.

## 2019-05-27 NOTE — Discharge Instructions (Addendum)
To ER for worsening pain, difficulty breathing, palpitations, lightheadedness, weakness, nausea, vomiting, severe abdominal pain.

## 2019-05-27 NOTE — Telephone Encounter (Signed)
New message  Pt c/o of Chest Pain: STAT if CP now or developed within 24 hours  1. Are you having CP right now? No   2. Are you experiencing any other symptoms (ex. SOB, nausea, vomiting, sweating)? No   3. How long have you been experiencing CP? Last night per patient   4. Is your CP continuous or coming and going? Coming and going   5. Have you taken Nitroglycerin? No, patient took 4... 81 mg aspirin  ?

## 2019-05-27 NOTE — ED Provider Notes (Signed)
[facility]    CSN: [id number] Arrival date & time: 05/27/19  1513      History   Chief Complaint Chief Complaint  Patient presents with  . Chest Pain    HPI Nicole Hughes is a 49 y.o. female with history of cardiomyopathy, anxiety, DVT/PE during pregnancy, hypertension, migraines presenting for left-sided chest pain.  Endorsing radiation up to side of neck.  States this has been intermittent since last night.  Denies inciting event.  No difficulty breathing, nausea, vomiting, lightheadedness, dizziness, weakness.  Patient denied abdominal pain, recent change in medications or lifestyle.  Patient has been sober since 5/1: Denies tremulousness, seizure.  Denies narcotic use.  Patient states she is been seen for this before and diagnosed with anxiety, though states "this feels different ".  Patient denies history of stroke or heart attack.  No lower extremity edema, prolonged stasis, surgery, injury.  Has tried 325 mg aspirin, chewed, without relief.    Past Medical History:  Diagnosis Date  . Anxiety   . Cardiomyopathy (HCC)   . Chronic lower back pain   . Complication of anesthesia    "hard to get under"  . Cyst of ovary   . Depression    hx  . DVT complicating pregnancy 08/1998   RLE; RUE  . Dysmenorrhea   . Dyspareunia   . 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 GERD (gastroesophageal reflux disease)   . Hay fever    "fall and spring" (02/17/2017)  . Headache    "2-3 times/.wik" (02/17/2017)  . History of chicken pox   . Hypertension   . Migraine    "monthly" (02/17/2017)  . Pelvic pain   . Pulmonary embolism (HCC) 08/1998   S/P childbirth    Patient Active Problem List   Diagnosis Date Noted  . Insect bite 06/21/2018  . Pedal edema 03/31/2018  . Anemia 03/31/2018  . Appetite loss 03/08/2018  . Myofascial pain 03/08/2018  . IBS (irritable bowel syndrome) 03/08/2018  . Alcoholic hepatitis without ascites 03/05/2018  . Elevated LFTs  03/03/2018  . Achilles tendonitis 01/28/2018  . Abnormal cervical Papanicolaou smear 09/29/2017  . Insomnia 09/29/2017  . Pain 09/29/2017  . Palpitations 05/25/2017  . Chest wall pain 05/25/2017  . Elevated transaminase level 05/25/2017  . Depression with anxiety 02/23/2017  . 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 Hyponatremia 02/17/2017  . Hypokalemia 09/24/2016  . Alcohol abuse 09/22/2016  . H/O acute pancreatitis 09/22/2016  . GERD (gastroesophageal reflux disease) 04/09/2015  . 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 Chest pain 08/20/2012  . Stress reaction 07/23/2012  . Hypertension 06/09/2012  . History of pulmonary embolism 06/09/2012  . Preop cardiovascular exam 06/08/2012  . Dysmenorrhea   . Dyspareunia   . Pelvic pain     Past Surgical History:  Procedure Laterality Date  . CESAREAN SECTION  1994  . ECTOPIC PREGNANCY SURGERY  2010  . LAPAROSCOPY N/A 09/24/2012   Procedure: OPERATIVE LAPROSCOPY WITH LYSIS OF ADHESIONS;  Surgeon: Sherron Monday, MD;  Location: [facility];  Service: Gynecology;  Laterality: N/A;  . TONSILLECTOMY  1984  . TUBAL LIGATION  2010    OB History   No obstetric history on file.      Home Medications    Prior to Admission medications   Medication Sig Start Date End Date Taking? Authorizing Provider  amLODipine (NORVASC) 10 MG tablet Take 1 tablet (10 mg total) by mouth daily. 03/31/18   Tower, Audrie Gallus, MD  gabapentin (  NEURONTIN) 300 MG capsule Take 1 capsule (300 mg total) by mouth 4 (four) times daily. Patient taking differently: Take 300 mg by mouth 4 (four) times daily as needed (pain).  11/30/18   Landis Martins, DPM  hydrOXYzine (ATARAX/VISTARIL) 25 MG tablet Take 1 tablet (25 mg total) by mouth every 8 (eight) hours as needed for anxiety or nausea. Caution of sedation 04/29/19   Tower, Wynelle Fanny, MD  ibuprofen (ADVIL) 800 MG tablet Take 1 tablet (800 mg total) by mouth every 8 (eight) hours as needed. Patient taking differently: Take 800 mg  by mouth every 8 (eight) hours as needed for headache or moderate pain.  02/15/19   Landis Martins, DPM  lisinopril (PRINIVIL,ZESTRIL) 10 MG tablet Take 2 tablets (20 mg total) by mouth daily. 03/31/18   Tower, Wynelle Fanny, MD  metoprolol succinate (TOPROL-XL) 50 MG 24 hr tablet TAKE 1 TABLET BY MOUTH ONCE DAILY(TAKE WITH OR IMMEDIATELY FOLLOWING A MEAL) 04/13/19   Tower, Wynelle Fanny, MD  mirtazapine (REMERON) 15 MG tablet TAKE 1 TABLET BY MOUTH AT BEDTIME 05/02/19   Tower, Wynelle Fanny, MD  Multiple Vitamin (MULTIVITAMIN WITH MINERALS) TABS tablet Take 1 tablet by mouth daily. 02/19/17   Bonnell Public, MD  naltrexone (DEPADE) 50 MG tablet Take 1 tablet (50 mg total) by mouth daily. 04/29/19   Tower, Wynelle Fanny, MD  omeprazole (PRILOSEC) 20 MG capsule Take 1 capsule by mouth once daily 04/13/19   Tower, Wynelle Fanny, MD  sertraline (ZOLOFT) 50 MG tablet Take one pill daily by mouth for 2 weeks, then increase to 1 1/2 pills daily for 2 weeks , then 2 pills daily Patient taking differently: Take 100 mg by mouth daily.  03/01/19   Tower, Wynelle Fanny, MD    Family History Family History  Problem Relation Age of Onset  . Cancer - Lung Mother   . Heart failure Father   . Hypertension Father     Social History Social History   Tobacco Use  . Smoking status: Never Smoker  . Smokeless tobacco: Never Used  Substance Use Topics  . Alcohol use: Not Currently    Alcohol/week: 5.0 standard drinks    Types: 5 Cans of beer per week    Comment: has quit May 07, 2019  . Drug use: No     Allergies   Patient has no known allergies.   Review of Systems As per HPI   Physical Exam Triage Vital Signs ED Triage Vitals  Enc Vitals Group     BP      Pulse      Resp      Temp      Temp src      SpO2      Weight      Height      Head Circumference      Peak Flow      Pain Score      Pain Loc      Pain Edu?      Excl. in [place]?    No data found.  Updated Vital Signs BP (!) 167/98 (BP Location: Left Arm)   Pulse 90    Temp 98 F (36.7 C) (Oral)   Resp 20   LMP 05/20/2019   SpO2 100%   Visual Acuity Right Eye Distance:   Left Eye Distance:   Bilateral Distance:    Right Eye Near:   Left Eye Near:    Bilateral Near:     Physical Exam  Constitutional:      General: She is not in acute distress.    Appearance: She is well-developed. She is not ill-appearing or diaphoretic.  HENT:     Head: Normocephalic and atraumatic.  Eyes:     General: No scleral icterus.    Pupils: Pupils are equal, round, and reactive to light.  Neck:     Vascular: No JVD.     Trachea: No tracheal deviation.  Cardiovascular:     Rate and Rhythm: Normal rate and regular rhythm.  No extrasystoles are present.    Chest Wall: PMI is not displaced.     Heart sounds: Normal heart sounds.  Pulmonary:     Effort: Pulmonary effort is normal. No tachypnea, accessory muscle usage or respiratory distress.     Breath sounds: Normal breath sounds.  Chest:     Chest wall: No mass, deformity, tenderness, crepitus or edema.  Musculoskeletal:     Cervical back: Neck supple.  Skin:    General: Skin is warm.     Capillary Refill: Capillary refill takes less than 2 seconds.     Coloration: Skin is not cyanotic, jaundiced or pale.  Neurological:     General: No focal deficit present.     Mental Status: She is alert and oriented to person, place, and time.      UC Treatments / Results  Labs (all labs ordered are listed, but only abnormal results are displayed) Labs Reviewed - No data to display  EKG   Radiology No results found.  Procedures Procedures (including critical care time)  Medications Ordered in UC Medications  alum & mag hydroxide-simeth (MAALOX/MYLANTA) 200-200-20 MG/5ML suspension 30 mL (30 mLs Oral Given 05/27/19 1601)    And  lidocaine (XYLOCAINE) 2 % viscous mouth solution 15 mL (15 mLs Oral Given 05/27/19 1601)    Initial Impression / Assessment and Plan / UC Course  I have reviewed the triage  vital signs and the nursing notes.  Pertinent labs & imaging results that were available during my care of the patient were reviewed by me and considered in my medical decision making (see chart for details).     Patient afebrile, nontoxic in office today.  EKG done in office, reviewed by me and compared to previous from 04/12/2019: NSR with ventricular rate 77 bpm.  No QTC prolongation, ST elevation or depression.  Overall stable EKG: Relayed findings to patient verbalized understanding.  Patient given GI cocktail with lidocaine without relief.  Patient did take 325 mg of aspirin (chewed) PTA without relief.  Patient denies history of MI, CVA.  Low concern for DVT, PE as patient has not had inciting event-last PE was during pregnancy.  Discussed limited role of EKG and cardiac rule out.  Patient requesting additional medication for pain: Stated that Toradol would be contraindicated due to risk of cardiac thrombotic events and steroids can increase bleeding risk.  Advised that if patient is feeling this much left-sided chest pain, she should go to the ER despite reassuring EKG.  Patient states she does not want to go to the ER at this time: Electing to discharge to home.  Return precautions discussed, patient verbalized understanding and is agreeable to plan. Final Clinical Impressions(s) / UC Diagnoses   Final diagnoses:  Chest pain, unspecified type     Discharge Instructions     To ER for worsening pain, difficulty breathing, palpitations, lightheadedness, weakness, nausea, vomiting, severe abdominal pain.    ED Prescriptions    None  PDMP not reviewed this encounter.   Hall-Potvin, [place], [place] 05/28/19 [phone]

## 2019-05-27 NOTE — Telephone Encounter (Signed)
The patient has been experiencing intermittent chest pain since last night. She reports that she got her first COVID shot on Monday. She reports left sided chest pain with numbness and tingling in the left side of the neck and both of her hands.She does have a history of anxiety attacks but states that this feels different from the chest pain she experiences when having anxiety. She has taken 4 aspirin in the last hour with no relief. I advised the patient to call EMS or go to the ED if she has someone who can drive her so that she can be evaluate immediately. Pt is hesitant to go to ED--this RN reiterated the importance of being evaluated as soon as possible. Pt verbalized understanding.

## 2019-05-28 ENCOUNTER — Encounter: Payer: Self-pay | Admitting: Emergency Medicine

## 2019-05-29 ENCOUNTER — Other Ambulatory Visit: Payer: Self-pay

## 2019-05-29 ENCOUNTER — Emergency Department (HOSPITAL_COMMUNITY)
Admission: EM | Admit: 2019-05-29 | Discharge: 2019-05-29 | Disposition: A | Discharge status: Home / Self Care | Payer: 59 | Attending: Emergency Medicine | Admitting: Emergency Medicine

## 2019-05-29 ENCOUNTER — Emergency Department (HOSPITAL_COMMUNITY): Payer: 59

## 2019-05-29 ENCOUNTER — Encounter (HOSPITAL_COMMUNITY): Payer: Self-pay | Admitting: Emergency Medicine

## 2019-05-29 DIAGNOSIS — R1013 Epigastric pain: Secondary | ICD-10-CM | POA: Diagnosis not present

## 2019-05-29 DIAGNOSIS — R0789 Other chest pain: Secondary | ICD-10-CM | POA: Insufficient documentation

## 2019-05-29 DIAGNOSIS — I10 Essential (primary) hypertension: Secondary | ICD-10-CM | POA: Diagnosis not present

## 2019-05-29 DIAGNOSIS — Z86711 Personal history of pulmonary embolism: Secondary | ICD-10-CM | POA: Diagnosis not present

## 2019-05-29 DIAGNOSIS — Z86718 Personal history of other venous thrombosis and embolism: Secondary | ICD-10-CM | POA: Insufficient documentation

## 2019-05-29 DIAGNOSIS — R079 Chest pain, unspecified: Secondary | ICD-10-CM

## 2019-05-29 DIAGNOSIS — Z79899 Other long term (current) drug therapy: Secondary | ICD-10-CM | POA: Insufficient documentation

## 2019-05-29 LAB — COMPREHENSIVE METABOLIC PANEL
ALT: 34 U/L (ref 0–44)
AST: 35 U/L (ref 15–41)
Albumin: 4.5 g/dL (ref 3.5–5.0)
Alkaline Phosphatase: 41 U/L (ref 38–126)
Anion gap: 14 (ref 5–15)
BUN: 12 mg/dL (ref 6–20)
CO2: 22 mmol/L (ref 22–32)
Calcium: 9.5 mg/dL (ref 8.9–10.3)
Chloride: 101 mmol/L (ref 98–111)
Creatinine, Ser: 0.73 mg/dL (ref 0.44–1.00)
GFR calc Af Amer: >60 mL/min (ref >60)
GFR calc non Af Amer: >60 mL/min (ref >60)
Glucose, Bld: 95 mg/dL (ref 70–99)
Potassium: 4.2 mmol/L (ref 3.5–5.1)
Sodium: 137 mmol/L (ref 135–145)
Total Bilirubin: 0.8 mg/dL (ref 0.3–1.2)
Total Protein: 7.6 g/dL (ref 6.5–8.1)

## 2019-05-29 LAB — ETHANOL: Alcohol, Ethyl (B): <10 mg/dL (ref <10)

## 2019-05-29 LAB — CBC WITH DIFFERENTIAL/PLATELET
Abs Immature Granulocytes: 0.02 10*3/uL (ref 0.00–0.07)
Basophils Absolute: 0.1 10*3/uL (ref 0.0–0.1)
Basophils Relative: 1 %
Eosinophils Absolute: 0.1 10*3/uL (ref 0.0–0.5)
Eosinophils Relative: 1 %
HCT: 36.1 % (ref 36.0–46.0)
Hemoglobin: 11.2 g/dL — ABNORMAL LOW (ref 12.0–15.0)
Immature Granulocytes: 0 %
Lymphocytes Relative: 22 %
Lymphs Abs: 1.7 10*3/uL (ref 0.7–4.0)
MCH: 23.8 pg — ABNORMAL LOW (ref 26.0–34.0)
MCHC: 31 g/dL (ref 30.0–36.0)
MCV: 76.6 fL — ABNORMAL LOW (ref 80.0–100.0)
Monocytes Absolute: 0.6 10*3/uL (ref 0.1–1.0)
Monocytes Relative: 7 %
Neutro Abs: 5.3 10*3/uL (ref 1.7–7.7)
Neutrophils Relative %: 69 %
Platelets: 260 10*3/uL (ref 150–400)
RBC: 4.71 MIL/uL (ref 3.87–5.11)
RDW: 17.9 % — ABNORMAL HIGH (ref 11.5–15.5)
WBC: 7.7 10*3/uL (ref 4.0–10.5)
nRBC: 0 % (ref 0.0–0.2)

## 2019-05-29 LAB — TROPONIN I (HIGH SENSITIVITY)
Troponin I (High Sensitivity): <2 ng/L (ref <18)
Troponin I (High Sensitivity): <2 ng/L (ref <18)

## 2019-05-29 LAB — PROTIME-INR
INR: 1.9 — ABNORMAL HIGH (ref 0.8–1.2)
Prothrombin Time: 21 seconds — ABNORMAL HIGH (ref 11.4–15.2)

## 2019-05-29 LAB — D-DIMER, QUANTITATIVE: D-Dimer, Quant: <0.27 ug/mL-FEU (ref 0.00–0.50)

## 2019-05-29 MED ORDER — ONDANSETRON HCL 4 MG/2ML IJ SOLN
4.0000 mg | Freq: Once | INTRAMUSCULAR | Status: AC
  Administered 2019-05-29: 4 mg via INTRAVENOUS
  Filled 2019-05-29: qty 2

## 2019-05-29 MED ORDER — LORAZEPAM 2 MG/ML IJ SOLN
0.5000 mg | Freq: Once | INTRAMUSCULAR | Status: AC
  Administered 2019-05-29: 0.5 mg via INTRAVENOUS
  Filled 2019-05-29: qty 1

## 2019-05-29 MED ORDER — FAMOTIDINE 20 MG PO TABS
20.0000 mg | ORAL_TABLET | Freq: Two times a day (BID) | ORAL | 0 refills | Status: DC
Start: 2019-05-29 — End: 2019-06-21

## 2019-05-29 MED ORDER — ALUM & MAG HYDROXIDE-SIMETH 200-200-20 MG/5ML PO SUSP
30.0000 mL | Freq: Once | ORAL | Status: AC
  Administered 2019-05-29: 30 mL via ORAL
  Filled 2019-05-29: qty 30

## 2019-05-29 MED ORDER — MORPHINE SULFATE (PF) 4 MG/ML IV SOLN
4.0000 mg | Freq: Once | INTRAVENOUS | Status: AC
  Administered 2019-05-29: 4 mg via INTRAVENOUS
  Filled 2019-05-29: qty 1

## 2019-05-29 MED ORDER — FAMOTIDINE IN NACL 20-0.9 MG/50ML-% IV SOLN
20.0000 mg | Freq: Once | INTRAVENOUS | Status: AC
  Administered 2019-05-29: 20 mg via INTRAVENOUS
  Filled 2019-05-29: qty 50

## 2019-05-29 MED ORDER — LIDOCAINE VISCOUS HCL 2 % MT SOLN
15.0000 mL | Freq: Once | OROMUCOSAL | Status: AC
  Administered 2019-05-29: 15 mL via ORAL
  Filled 2019-05-29: qty 15

## 2019-05-29 NOTE — ED Triage Notes (Signed)
Pt to triage via [facility].  Pt reports tightness to center of chest x 2 days with nausea and SOB.  EMS states pt stopped heavy ETOH use on 5/1.  States she was seen at [facility] and treated for GERD without any improvement.

## 2019-05-29 NOTE — ED Notes (Signed)
Pt would like RN to ask EDP for discharge pain medications. EDP aware of request, will not be able to meet this request. Instructed to use tylenol OTC for pain.  Patient verbalizes understanding of discharge instructions. Opportunity for questioning and answers were provided. Armband removed by staff, pt discharged from ED to home with friend.

## 2019-05-29 NOTE — Discharge Instructions (Addendum)
Please return for any problem.  Follow-up with your regular care provider as instructed. °

## 2019-05-29 NOTE — ED Provider Notes (Signed)
[facility] EMERGENCY DEPARTMENT Provider Note   CSN: [id number] Arrival date & time: 05/29/19  1058     History Chief Complaint  Patient presents with  . Chest Pain    Nicole Hughes is a 49 y.o. female.  49 year old female with prior medical history as detailed below presents for evaluation of chest discomfort. Patient reports constant anterior chest discomfort for the last 3 days. Patient reports that symptoms started on approximately Thursday of this past week. She reports the pain is persistent. Pain radiates to the left shoulder. She denies fever or cough. She denies shortness of breath. She does report distant history of PE. She is not currently on antithrombotic medications. Her prior PE was related to being pregnant.  She reports prior history of GERD with reported full compliance with omeprazole.  The history is provided by the patient and medical records.  Chest Pain Pain location:  L chest, R chest, epigastric and substernal area Pain quality: aching   Pain radiates to:  Does not radiate Pain severity:  Mild Onset quality:  Gradual Duration:  3 days Timing:  Constant Progression:  Unchanged Chronicity:  New Relieved by:  Nothing Worsened by:  Nothing      Past Medical History:  Diagnosis Date  . Anxiety   . Cardiomyopathy (HCC)   . Chronic lower back pain   . Complication of anesthesia    "hard to get under"  . Cyst of ovary   . Depression    hx  . DVT complicating pregnancy 08/1998   RLE; RUE  . Dysmenorrhea   . Dyspareunia   . 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 GERD (gastroesophageal reflux disease)   . Hay fever    "fall and spring" (02/17/2017)  . Headache    "2-3 times/.wik" (02/17/2017)  . History of chicken pox   . Hypertension   . Migraine    "monthly" (02/17/2017)  . Pelvic pain   . Pulmonary embolism (HCC) 08/1998   S/P childbirth    Patient Active Problem List   Diagnosis Date Noted  . Insect bite  06/21/2018  . Pedal edema 03/31/2018  . Anemia 03/31/2018  . Appetite loss 03/08/2018  . Myofascial pain 03/08/2018  . IBS (irritable bowel syndrome) 03/08/2018  . Alcoholic hepatitis without ascites 03/05/2018  . Elevated LFTs 03/03/2018  . Achilles tendonitis 01/28/2018  . Abnormal cervical Papanicolaou smear 09/29/2017  . Insomnia 09/29/2017  . Pain 09/29/2017  . Palpitations 05/25/2017  . Chest wall pain 05/25/2017  . Elevated transaminase level 05/25/2017  . Depression with anxiety 02/23/2017  . 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 Hyponatremia 02/17/2017  . Hypokalemia 09/24/2016  . Alcohol abuse 09/22/2016  . H/O acute pancreatitis 09/22/2016  . GERD (gastroesophageal reflux disease) 04/09/2015  . 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 Chest pain 08/20/2012  . Stress reaction 07/23/2012  . Hypertension 06/09/2012  . History of pulmonary embolism 06/09/2012  . Preop cardiovascular exam 06/08/2012  . Dysmenorrhea   . Dyspareunia   . Pelvic pain     Past Surgical History:  Procedure Laterality Date  . CESAREAN SECTION  1994  . ECTOPIC PREGNANCY SURGERY  2010  . LAPAROSCOPY N/A 09/24/2012   Procedure: OPERATIVE LAPROSCOPY WITH LYSIS OF ADHESIONS;  Surgeon: Sherron Monday, MD;  Location: [facility];  Service: Gynecology;  Laterality: N/A;  . TONSILLECTOMY  1984  . TUBAL LIGATION  2010     OB History   No obstetric history on file.  Family History  Problem Relation Age of Onset  . Cancer - Lung Mother   . Heart failure Father   . Hypertension Father     Social History   Tobacco Use  . Smoking status: Never Smoker  . Smokeless tobacco: Never Used  Substance Use Topics  . Alcohol use: Not Currently    Alcohol/week: 5.0 standard drinks    Types: 5 Cans of beer per week    Comment: has quit May 07, 2019  . Drug use: No    Home Medications Prior to Admission medications   Medication Sig Start Date End Date Taking? Authorizing Provider  amLODipine (NORVASC)  10 MG tablet Take 1 tablet (10 mg total) by mouth daily. 03/31/18   Tower, Wynelle Fanny, MD  gabapentin (NEURONTIN) 300 MG capsule Take 1 capsule (300 mg total) by mouth 4 (four) times daily. Patient taking differently: Take 300 mg by mouth 4 (four) times daily as needed (pain).  11/30/18   Landis Martins, DPM  hydrOXYzine (ATARAX/VISTARIL) 25 MG tablet Take 1 tablet (25 mg total) by mouth every 8 (eight) hours as needed for anxiety or nausea. Caution of sedation 04/29/19   Tower, Wynelle Fanny, MD  ibuprofen (ADVIL) 800 MG tablet Take 1 tablet (800 mg total) by mouth every 8 (eight) hours as needed. Patient taking differently: Take 800 mg by mouth every 8 (eight) hours as needed for headache or moderate pain.  02/15/19   Landis Martins, DPM  lisinopril (PRINIVIL,ZESTRIL) 10 MG tablet Take 2 tablets (20 mg total) by mouth daily. 03/31/18   Tower, Wynelle Fanny, MD  metoprolol succinate (TOPROL-XL) 50 MG 24 hr tablet TAKE 1 TABLET BY MOUTH ONCE DAILY(TAKE WITH OR IMMEDIATELY FOLLOWING A MEAL) 04/13/19   Tower, Wynelle Fanny, MD  mirtazapine (REMERON) 15 MG tablet TAKE 1 TABLET BY MOUTH AT BEDTIME 05/02/19   Tower, Wynelle Fanny, MD  Multiple Vitamin (MULTIVITAMIN WITH MINERALS) TABS tablet Take 1 tablet by mouth daily. 02/19/17   Bonnell Public, MD  naltrexone (DEPADE) 50 MG tablet Take 1 tablet (50 mg total) by mouth daily. 04/29/19   Tower, Wynelle Fanny, MD  omeprazole (PRILOSEC) 20 MG capsule Take 1 capsule by mouth once daily 04/13/19   Tower, Wynelle Fanny, MD  sertraline (ZOLOFT) 50 MG tablet Take one pill daily by mouth for 2 weeks, then increase to 1 1/2 pills daily for 2 weeks , then 2 pills daily Patient taking differently: Take 100 mg by mouth daily.  03/01/19   Tower, Wynelle Fanny, MD    Allergies    Patient has no known allergies.  Review of Systems   Review of Systems  Cardiovascular: Positive for chest pain.  All other systems reviewed and are negative.   Physical Exam Updated Vital Signs BP (!) 103/59 (BP Location: Right  Arm)   Pulse 80   Temp 98.3 F (36.8 C) (Oral)   Resp 12   Ht 5\' 5"  (1.651 m)   Wt 71.2 kg   LMP 05/20/2019   SpO2 99%   BMI 26.13 kg/m   Physical Exam Vitals and nursing note reviewed.  Constitutional:      General: She is not in acute distress.    Appearance: She is well-developed.  HENT:     Head: Normocephalic and atraumatic.  Eyes:     Conjunctiva/sclera: Conjunctivae normal.     Pupils: Pupils are equal, round, and reactive to light.  Cardiovascular:     Rate and Rhythm: Normal rate and regular rhythm.  Heart sounds: Normal heart sounds.  Pulmonary:     Effort: Pulmonary effort is normal. No respiratory distress.     Breath sounds: Normal breath sounds.  Abdominal:     General: There is no distension.     Palpations: Abdomen is soft.     Tenderness: There is no abdominal tenderness.  Musculoskeletal:        General: No deformity. Normal range of motion.     Cervical back: Normal range of motion and neck supple.  Skin:    General: Skin is warm and dry.  Neurological:     Mental Status: She is alert and oriented to person, place, and time.     ED Results / Procedures / Treatments   Labs (all labs ordered are listed, but only abnormal results are displayed) Labs Reviewed  CBC WITH DIFFERENTIAL/PLATELET - Abnormal; Notable for the following components:      Result Value   Hemoglobin 11.2 (*)    MCV 76.6 (*)    MCH 23.8 (*)    RDW 17.9 (*)    All other components within normal limits  PROTIME-INR - Abnormal; Notable for the following components:   Prothrombin Time 21.0 (*)    INR 1.9 (*)    All other components within normal limits  COMPREHENSIVE METABOLIC PANEL  ETHANOL  D-DIMER, QUANTITATIVE (NOT AT [facility])  RAPID URINE DRUG SCREEN, HOSP PERFORMED  TROPONIN I (HIGH SENSITIVITY)  TROPONIN I (HIGH SENSITIVITY)    EKG EKG Interpretation  Date/Time:  Sunday May 29 2019 12:03:56 EDT Ventricular Rate:  94 PR Interval:    QRS Duration: 84 QT  Interval:  363 QTC Calculation: 454 R Axis:   18 Text Interpretation: Sinus rhythm Confirmed by Kristine Royal [id number]) on 05/29/2019 12:10:09 PM   Radiology DG Chest Port 1 View  Result Date: 05/29/2019 CLINICAL DATA:  Chest pain. EXAM: PORTABLE CHEST 1 VIEW COMPARISON:  April 11, 2019 FINDINGS: Cardiomediastinal silhouette is normal. Mediastinal contours appear intact. There is no evidence of focal airspace consolidation, pleural effusion or pneumothorax. Osseous structures are without acute abnormality. Soft tissues are grossly normal. IMPRESSION: No active disease. Electronically Signed   By: Ted Mcalpine M.D.   On: 05/29/2019 12:22    Procedures Procedures (including critical care time)  Medications Ordered in ED Medications  famotidine (PEPCID) IVPB 20 mg premix (has no administration in time range)  alum & mag hydroxide-simeth (MAALOX/MYLANTA) 200-200-20 MG/5ML suspension 30 mL (has no administration in time range)    And  lidocaine (XYLOCAINE) 2 % viscous mouth solution 15 mL (has no administration in time range)  ondansetron (ZOFRAN) injection 4 mg (4 mg Intravenous Given 05/29/19 1154)  morphine 4 MG/ML injection 4 mg (4 mg Intravenous Given 05/29/19 1154)  LORazepam (ATIVAN) injection 0.5 mg (0.5 mg Intravenous Given 05/29/19 1249)    ED Course  I have reviewed the triage vital signs and the nursing notes.  Pertinent labs & imaging results that were available during my care of the patient were reviewed by me and considered in my medical decision making (see chart for details).    MDM Rules/Calculators/A&P                     MDM  Screen complete  Nicole Hughes was evaluated in Emergency Department on 05/29/2019 for the symptoms described in the history of present illness. She was evaluated in the context of the global COVID-19 pandemic, which necessitated consideration that the patient might be at risk  for infection with the SARS-CoV-2 virus that causes COVID-19.  Institutional protocols and algorithms that pertain to the evaluation of patients at risk for COVID-19 are in a state of rapid change based on information released by regulatory bodies including the CDC and federal and state organizations. These policies and algorithms were followed during the patient's care in the ED.   Patient is presenting for evaluation of atypical chest discomfort.  Patient's describe symptoms not consistent with likely ACS. EKG is without evidence of acute ischemia. Troponin is less than 2.  D-dimer is negative. Chest x-ray does not reveal significant pathology.  Patient's describe symptoms are perhaps more consistent with likely GERD. Patient reports that she is compliant with omeprazole. Patient does have a history of alcohol use and abuse.  Patient is appropriate for discharge. Patient is advised to limit any alcohol consumption. Patient will be encouraged to closely follow-up with her primary care and GI.   Final Clinical Impression(s) / ED Diagnoses Final diagnoses:  Chest pain, unspecified type    Rx / DC Orders ED Discharge Orders         Ordered    famotidine (PEPCID) 20 MG tablet  2 times daily     05/29/19 1453           Wynetta Fines, MD 05/29/19 1552

## 2019-05-29 NOTE — ED Notes (Signed)
Pt reports increased CP, worse after morphine. EKG repeated and EDP notified. New orders placed.

## 2019-05-30 ENCOUNTER — Telehealth: Payer: Self-pay

## 2019-05-30 ENCOUNTER — Telehealth: Payer: Self-pay | Admitting: *Deleted

## 2019-05-30 NOTE — Telephone Encounter (Signed)
   [facility] Pre-operative Risk Assessment    HEARTCARE STAFF: - Please ensure there is not already an duplicate clearance open for this procedure. - Under Visit Info/Reason for Call, type in Other and utilize the format Clearance MM/DD/YY or Clearance TBD. Do not use dashes or single digits. - If request is for dental extraction, please clarify the # of teeth to be extracted.  Request for surgical clearance:  1. What type of surgery is being performed? HYSTERECTOMY   2. When is this surgery scheduled? TBD   3. What type of clearance is required (medical clearance vs. Pharmacy clearance to hold med vs. Both)? MEDICAL  4. Are there any medications that need to be held prior to surgery and how long? NONE LISTED   5. Practice name and name of physician performing surgery? [facility]; DR. Jeral Fruit [name]   6. What is the office phone number? [phone]   7.   What is the office fax number?  [phone]  8.   Anesthesia type (None, local, MAC, general) ? GENERAL   Julaine Hua 05/30/2019, 4:23 PM  _________________________________________________________________   (provider comments below)

## 2019-05-30 NOTE — Telephone Encounter (Signed)
Pt already has 2 30' appts scheduled;  05/31/19 at 8 AM for HFU and  06/01/19 at 10:45 for BP FU.

## 2019-05-30 NOTE — Telephone Encounter (Signed)
[place]

## 2019-05-30 NOTE — Telephone Encounter (Signed)
[facility] Night - Client TELEPHONE ADVICE RECORD [facility]urse Patient Name: Nicole Hughes Gender: Female DOB: Nov 05, 1970 Age: 49 Y 1 M 27 D Return Phone Number: [phone] (Primary) Address: City/State/Zip: [place] [address] Client [facility] Night - Client Client Site [facility] - Night Physician Tower, Idamae Schuller - MD Contact Type Call Who Is Calling Patient / Member / Family / Caregiver Call Type Triage / Clinical Relationship To Patient Self Return Phone Number [phone] (Primary) Chief Complaint CHEST PAIN (>=21 years) - pain, pressure, heaviness or tightness Reason for Call Request to Speak to a Physician Initial Comment Caller is at the ER at [facility] with chest pains. She would like to have her doctor paged. Translation No No Triage Reason Other Nurse Assessment Nurse: Solocinski, RN, Beth Date/Time (Eastern Time): 05/29/2019 4:20:26 PM Confirm and document reason for call. If symptomatic, describe symptoms. ---Caller states she is at ED for chest pain and being evaluated at the ED now. Caller states she is wanting to see Dr. Milinda Antis before Wednesday and was hoping to make an appointment. Has the patient had close contact with a person known or suspected to have the novel coronavirus illness OR traveled / lives in area with major community spread (including international travel) in the last 14 days from the onset of symptoms? * If Asymptomatic, screen for exposure and travel within the last 14 days. ---No Does the patient have any new or worsening symptoms? ---Yes Will a triage be completed? ---No Select reason for no triage. ---Other Please document clinical information provided and list any resource used. ---Informed caller to follow up at 8 am for an appointment per office hours. Guidelines Guideline Title Affirmed Question Affirmed Notes Nurse Date/Time (Eastern Time) Disp. Time Lamount Cohen  Time) Disposition Final User 05/29/2019 4:19:36 PM Send to Urgent Remi Haggard 05/29/2019 4:28:04 PM Clinical Call Yes Solocinski, RN, Beth PLEASE NOTE: All timestamps contained within this report are represented as [place] Standard Time. CONFIDENTIALTY NOTICE: This fax transmission is intended only for the addressee. It contains information that is legally privileged, confidential or otherwise protected from use or disclosure. If you are not the intended recipient, you are strictly prohibited from reviewing, disclosing, copying using or disseminating any of this information or taking any action in reliance on or regarding this information. If you have received this fax in error, please notify us immediately by telephone so that we can arrange for its return to [place]. Phone: [phone], Toll-Free: [phone], Fax: [phone] Page: 2 of 2 Call Id:

## 2019-05-30 NOTE — Telephone Encounter (Signed)
Pt wanting to know what to do until appt. Pt is having mid chest pain that goes up into lt jaw and then pain also goes up into rt jaw. Pt is light headed. Pt said pain level is 7. Feels like something is choking her in her throat and having difficulty breathing. Throat feels swollen on lt side. Dr Milinda Antis said for pt to go back to ED for eval and keep appt with DR Tower on 05/31/19. Pt said she is not going back to ED. Advised again with the symptoms she is having that is the best medical advice we can give. Pt voiced understanding and will see pt on 05/08/19. FYI to Dr Milinda Antis.

## 2019-05-30 NOTE — Telephone Encounter (Signed)
I have left a message for [facility] to please call our office to discuss notes sent to our office today. Left message to please fax over official clearance form to our office fax # [phone].   Notes from [facility] states: Patient was in last year to get medical clearance for a hysterectomy. She is ready to schedule but we want to make sure the patient is still cleared with you all since it has been over 6 months. Please update [place] as soon as you can. Thanks

## 2019-05-31 ENCOUNTER — Other Ambulatory Visit: Payer: Self-pay

## 2019-05-31 ENCOUNTER — Encounter: Payer: Self-pay | Admitting: Physician Assistant

## 2019-05-31 ENCOUNTER — Ambulatory Visit: Payer: 59 | Admitting: Family Medicine

## 2019-05-31 ENCOUNTER — Encounter: Payer: Self-pay | Admitting: Family Medicine

## 2019-05-31 VITALS — BP 135/82 | HR 99 | Temp 97.9°F | Ht 65.0 in | Wt 151.0 lb

## 2019-05-31 DIAGNOSIS — M7918 Myalgia, other site: Secondary | ICD-10-CM

## 2019-05-31 DIAGNOSIS — R079 Chest pain, unspecified: Secondary | ICD-10-CM | POA: Diagnosis not present

## 2019-05-31 DIAGNOSIS — I10 Essential (primary) hypertension: Secondary | ICD-10-CM

## 2019-05-31 DIAGNOSIS — R1314 Dysphagia, pharyngoesophageal phase: Secondary | ICD-10-CM

## 2019-05-31 DIAGNOSIS — R1310 Dysphagia, unspecified: Secondary | ICD-10-CM | POA: Insufficient documentation

## 2019-05-31 DIAGNOSIS — R1013 Epigastric pain: Secondary | ICD-10-CM | POA: Diagnosis not present

## 2019-05-31 LAB — BASIC METABOLIC PANEL
BUN: 8 mg/dL (ref 6–23)
CO2: 25 mEq/L (ref 19–32)
Calcium: 9.4 mg/dL (ref 8.4–10.5)
Chloride: 104 mEq/L (ref 96–112)
Creatinine, Ser: 0.78 mg/dL (ref 0.40–1.20)
GFR: 79.11 mL/min (ref >60.00)
Glucose, Bld: 107 mg/dL — ABNORMAL HIGH (ref 70–99)
Potassium: 4.5 mEq/L (ref 3.5–5.1)
Sodium: 136 mEq/L (ref 135–145)

## 2019-05-31 LAB — LIPASE: Lipase: 50 U/L (ref 11.0–59.0)

## 2019-05-31 MED ORDER — SUCRALFATE 1 G PO TABS
1.0000 g | ORAL_TABLET | Freq: Three times a day (TID) | ORAL | 1 refills | Status: DC | PRN
Start: 2019-05-31 — End: 2019-06-16

## 2019-05-31 NOTE — Assessment & Plan Note (Signed)
Checking lipase today in light of etoh hx Also some dysphagia  inst to stop nsaids  Sent in carafate to try Continue ppi and H2 blocker  Ref made to GI

## 2019-05-31 NOTE — Telephone Encounter (Signed)
FAXED TO REQUESTING PARTY'S OFFICE TO NOTIFY PT NEEDS APPT BEFORE CARDIAC CLEARANCE.  LEFT MESSAGE FOR PT TO CALL BACK AND SCHEDULE PRE-OP APPT

## 2019-05-31 NOTE — Patient Instructions (Signed)
Continue current medicines but stop ibuprofen   Drink water- try to avoid dehydration   Try carafate for the abdominal pain   I placed an urgent referral to GI - and our office will call you to set that up   You can try gabapentin again to see if it helps some of this pain   Keep deep breathing as well

## 2019-05-31 NOTE — Telephone Encounter (Signed)
   Primary Cardiologist: Dr. Katrinka Blazing Chart reviewed as part of pre-operative protocol coverage. Patient has not been seen since 10/2018 and she was recently seen in both the Urgent Care and ED for chest pain. Therefore,  will require a follow-up visit in order to better assess preoperative cardiovascular risk.  Pre-op covering staff: - Please schedule appointment and call patient to inform them. - Please contact requesting surgeon's office via preferred method (i.e, phone, fax) to inform them of need for appointment prior to surgery.  If applicable, this message will also be routed to pharmacy pool and/or primary cardiologist for input on holding anticoagulant/antiplatelet agent as requested below so that this information is available at time of patient's appointment.   Corrin Parker, PA-C  05/31/2019, 11:24 AM

## 2019-05-31 NOTE — Assessment & Plan Note (Signed)
Suspect atypical chest pain is caused by both this and reflux/esophageal problems along with stress  Enc her to re start gabapentin which may help pain  Avoid nsaids Continue ppi and H2 blocker  Continue to work on controlling anxiety and getting a psychiatrist  Ref to GI done

## 2019-05-31 NOTE — Assessment & Plan Note (Signed)
Elevated at first suspect due to pain and anxiety  Better on 2nd check  BP: 135/82

## 2019-05-31 NOTE — Assessment & Plan Note (Signed)
Atypical Reviewed hospital records, lab results and studies in detail  -reassuring w/u  Suspect GI issues along with stress and myofacial pain are adding  Gi ref done  carafate ordered  Enc to re start gabapentin

## 2019-05-31 NOTE — Progress Notes (Signed)
Subjective:    Patient ID: Nicole Hughes, female    DOB: [birth year], 49 y.o.   MRN: [id number]  This visit occurred during the SARS-CoV-2 public health emergency.  Safety protocols were in place, including screening questions prior to the visit, additional usage of staff PPE, and extensive cleaning of exam room while observing appropriate contact time as indicated for disinfecting solutions.    HPI  Pt presents for f/u of ER visit for chest pain and also BP  Wt Readings from Last 3 Encounters:  05/31/19 151 lb (68.5 kg)  05/29/19 157 lb (71.2 kg)  04/29/19 159 lb 5 oz (72.3 kg)  down 6 lbs   Having a hard time eating   25.13 kg/m   She had been doing better generally and sober  Then she began to walk for exercise  Symptoms started on Friday   Pt was seen in ED on 5/23 for chest discomfort /anterior -persistent and radiating to the L shoulder   A/P as follows Patient is presenting for evaluation of atypical chest discomfort.  Patient's describe symptoms not consistent with likely ACS. EKG is without evidence of acute ischemia. Troponin is less than 2.  D-dimer is negative. Chest x-ray does not reveal significant pathology.  Patient's describe symptoms are perhaps more consistent with likely GERD. Patient reports that she is compliant with omeprazole. Patient does have a history of alcohol use and abuse.  Patient is appropriate for discharge. Patient is advised to limit any alcohol consumption. Patient will be encouraged to closely follow-up with her primary care and GI.  She was given pepcid in the ER    DG Chest Port 1 View  Result Date: 05/29/2019 CLINICAL DATA:  Chest pain. EXAM: PORTABLE CHEST 1 VIEW COMPARISON:  April 11, 2019 FINDINGS: Cardiomediastinal silhouette is normal. Mediastinal contours appear intact. There is no evidence of focal airspace consolidation, pleural effusion or pneumothorax. Osseous structures are without acute abnormality. Soft tissues are  grossly normal. IMPRESSION: No active disease. Electronically Signed   By: Ted Mcalpineobrinka  Dimitrova M.D.   On: 05/29/2019 12:22      Chemistry      Component Value Date/Time   NA 137 05/29/2019 1131   NA 138 06/22/2017 1015   K 4.2 05/29/2019 1131   CL 101 05/29/2019 1131   CO2 22 05/29/2019 1131   BUN 12 05/29/2019 1131   BUN 6 06/22/2017 1015   CREATININE 0.73 05/29/2019 1131      Component Value Date/Time   CALCIUM 9.5 05/29/2019 1131   ALKPHOS 41 05/29/2019 1131   AST 35 05/29/2019 1131   ALT 34 05/29/2019 1131   BILITOT 0.8 05/29/2019 1131     Trop less than 2 Lab Results  Component Value Date   WBC 7.7 05/29/2019   HGB 11.2 (L) 05/29/2019   HCT 36.1 05/29/2019   MCV 76.6 (L) 05/29/2019   PLT 260 05/29/2019   Lab Results  Component Value Date   DDIMER <0.27 05/29/2019   Ethanol level negative   INR 1.9  Currently takes omeprazole and pepcid   Nsaid?-took advil 800 this am (had not had in 2 weeks)   Naltrexone to prevent alcohol relapse  remeron and hydroxyzine for sleep and anxiety along with zoloft   Gabapentin for chronic foot pain   bp is stable today  No cp or palpitations or headaches or edema  No side effects to medicines  BP Readings from Last 3 Encounters:  05/31/19 (!) 146/86  05/29/19 (!) 146/77  05/27/19 (!) 167/98    Lisinopril  toprol xl   2nd check today BP: 135/82    Pulse Readings from Last 3 Encounters:  05/31/19 99  05/29/19 100  05/27/19 90   Lab Results  Component Value Date   CHOL 199 04/09/2015   HDL 68.90 04/09/2015   LDLCALC 111 (H) 04/09/2015   TRIG 95.0 04/09/2015   CHOLHDL 3 04/09/2015   Today her chest discomfort goes from chest up neck to her head [place] in nature  Feels like there is something stuck when she swallows  Hard time swallowing   GI cocktail did not touch it   Feels sob when pain is bad/ gets lightheaded and shaky at times  Very anxious   Trying to take deep breaths and talk herself down    Has not seen a psychiatrist yet  Her medicines were helping and she wondered if she did not need to go   Has finally adj to the naltrexone    Patient Active Problem List   Diagnosis Date Noted  . Dysphagia 05/31/2019  . Insect bite 06/21/2018  . Pedal edema 03/31/2018  . Anemia 03/31/2018  . Appetite loss 03/08/2018  . Myofascial pain 03/08/2018  . IBS (irritable bowel syndrome) 03/08/2018  . Alcoholic hepatitis without ascites 03/05/2018  . Elevated LFTs 03/03/2018  . Achilles tendonitis 01/28/2018  . Abnormal cervical Papanicolaou smear 09/29/2017  . Insomnia 09/29/2017  . Pain 09/29/2017  . Palpitations 05/25/2017  . Chest wall pain 05/25/2017  . Elevated transaminase level 05/25/2017  . Depression with anxiety 02/23/2017  . 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 Hyponatremia 02/17/2017  . Hypokalemia 09/24/2016  . Alcohol abuse 09/22/2016  . H/O acute pancreatitis 09/22/2016  . GERD (gastroesophageal reflux disease) 04/09/2015  . Epigastric pain 01/17/2014  . 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 Chest pain 08/20/2012  . Stress reaction 07/23/2012  . Hypertension 06/09/2012  . History of pulmonary embolism 06/09/2012  . Preop cardiovascular exam 06/08/2012  . Dysmenorrhea   . Dyspareunia   . Pelvic pain    Past Medical History:  Diagnosis Date  . Anxiety   . Cardiomyopathy ([place])   . Chronic lower back pain   . Complication of anesthesia    "hard to get under"  . Cyst of ovary   . Depression    hx  . DVT complicating pregnancy [id number]   RLE; RUE  . Dysmenorrhea   . Dyspareunia   . 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 GERD (gastroesophageal reflux disease)   . Hay fever    "fall and spring" (02/17/2017)  . Headache    "2-3 times/.wik" (02/17/2017)  . History of chicken pox   . Hypertension   . Migraine    "monthly" (02/17/2017)  . Pelvic pain   . Pulmonary embolism ([place]) 08/1998   S/P childbirth   Past Surgical History:  Procedure  Laterality Date  . CESAREAN SECTION  1994  . ECTOPIC PREGNANCY SURGERY  2010  . LAPAROSCOPY N/A 09/24/2012   Procedure: OPERATIVE LAPROSCOPY WITH LYSIS OF ADHESIONS;  Surgeon: Thornell Sartorius, MD;  Location: [facility];  Service: Gynecology;  Laterality: N/A;  . TONSILLECTOMY  1984  . TUBAL LIGATION  2010   Social History   Tobacco Use  . Smoking status: Never Smoker  . Smokeless tobacco: Never Used  Substance Use Topics  . Alcohol use: Not Currently    Alcohol/week: 5.0 standard drinks    Types: 5 Cans of beer per week  Comment: has quit May 07, 2019  . Drug use: No   Family History  Problem Relation Age of Onset  . Cancer - Lung Mother   . Heart failure Father   . Hypertension Father    No Known Allergies Current Outpatient Medications on File Prior to Visit  Medication Sig Dispense Refill  . amLODipine (NORVASC) 10 MG tablet Take 1 tablet (10 mg total) by mouth daily. 90 tablet 3  . famotidine (PEPCID) 20 MG tablet Take 1 tablet (20 mg total) by mouth 2 (two) times daily. 30 tablet 0  . gabapentin (NEURONTIN) 300 MG capsule Take 1 capsule (300 mg total) by mouth 4 (four) times daily. (Patient taking differently: Take 300 mg by mouth 4 (four) times daily as needed (pain). ) 90 capsule 3  . hydrOXYzine (ATARAX/VISTARIL) 25 MG tablet Take 1 tablet (25 mg total) by mouth every 8 (eight) hours as needed for anxiety or nausea. Caution of sedation 90 tablet 0  . ibuprofen (ADVIL) 800 MG tablet Take 1 tablet (800 mg total) by mouth every 8 (eight) hours as needed. (Patient taking differently: Take 800 mg by mouth every 8 (eight) hours as needed for headache or moderate pain. ) 30 tablet 0  . lisinopril (PRINIVIL,ZESTRIL) 10 MG tablet Take 2 tablets (20 mg total) by mouth daily. 180 tablet 3  . metoprolol succinate (TOPROL-XL) 50 MG 24 hr tablet TAKE 1 TABLET BY MOUTH ONCE DAILY(TAKE WITH OR IMMEDIATELY FOLLOWING A MEAL) 90 tablet 0  . mirtazapine (REMERON) 15 MG tablet TAKE 1 TABLET BY MOUTH  AT BEDTIME 90 tablet 3  . Multiple Vitamin (MULTIVITAMIN WITH MINERALS) TABS tablet Take 1 tablet by mouth daily. 30 tablet 0  . naltrexone (DEPADE) 50 MG tablet Take 1 tablet (50 mg total) by mouth daily. 30 tablet 3  . omeprazole (PRILOSEC) 20 MG capsule Take 1 capsule by mouth once daily 90 capsule 1  . sertraline (ZOLOFT) 50 MG tablet Take one pill daily by mouth for 2 weeks, then increase to 1 1/2 pills daily for 2 weeks , then 2 pills daily (Patient taking differently: Take 100 mg by mouth daily. ) 60 tablet 3   No current facility-administered medications on file prior to visit.    Review of Systems  Constitutional: Positive for appetite change and fatigue. Negative for activity change, fever and unexpected weight change.  HENT: Positive for trouble swallowing. Negative for congestion, ear pain, rhinorrhea, sinus pressure and sore throat.   Eyes: Negative for pain, redness and visual disturbance.  Respiratory: Negative for cough, choking, shortness of breath and wheezing.   Cardiovascular: Positive for chest pain. Negative for palpitations and leg swelling.  Gastrointestinal: Positive for abdominal pain and nausea. Negative for abdominal distention, blood in stool, constipation, diarrhea, rectal pain and vomiting.       Epigastric pain  Food/pills get stuck   Endocrine: Negative for polydipsia and polyuria.  Genitourinary: Negative for dysuria, frequency and urgency.  Musculoskeletal: Positive for arthralgias, back pain and myalgias.  Skin: Negative for pallor and rash.  Allergic/Immunologic: Negative for environmental allergies.  Neurological: Positive for headaches. Negative for dizziness and syncope.  Hematological: Negative for adenopathy. Does not bruise/bleed easily.  Psychiatric/Behavioral: Positive for dysphoric mood and sleep disturbance. Negative for decreased concentration. The patient is nervous/anxious.        Objective:   Physical Exam Constitutional:      General:  She is in acute distress.     Appearance: Normal appearance. She is well-developed and  normal weight. She is not ill-appearing or diaphoretic.     Comments: Distressed with chest pain and headache early in the visit  This seemed to improved by end of the visit   HENT:     Head: Normocephalic and atraumatic.     Mouth/Throat:     Mouth: Mucous membranes are moist.  Eyes:     General: No scleral icterus.    Conjunctiva/sclera: Conjunctivae normal.     Pupils: Pupils are equal, round, and reactive to light.  Neck:     Thyroid: No thyromegaly.     Vascular: No carotid bruit or JVD.  Cardiovascular:     Rate and Rhythm: Regular rhythm. Tachycardia present.     Pulses: Normal pulses.     Heart sounds: Normal heart sounds. No gallop.   Pulmonary:     Effort: Pulmonary effort is normal. No respiratory distress.     Breath sounds: Normal breath sounds. No wheezing or rales.     Comments: No rales or crackles   Chest wall is tender anteriorly worse on the L No crepitus or step off in ribs Chest:     Chest wall: Tenderness present.  Abdominal:     General: Bowel sounds are normal. There is no distension or abdominal bruit.     Palpations: Abdomen is soft. There is no shifting dullness, fluid wave, hepatomegaly, splenomegaly or mass.     Tenderness: There is abdominal tenderness in the epigastric area. There is no right CVA tenderness, left CVA tenderness, guarding or rebound. Negative signs include Murphy's sign and McBurney's sign.  Musculoskeletal:     Cervical back: Normal range of motion and neck supple. No rigidity or tenderness.     Right lower leg: No edema.     Left lower leg: No edema.  Lymphadenopathy:     Cervical: No cervical adenopathy.  Skin:    General: Skin is warm and dry.     Coloration: Skin is not jaundiced or pale.     Findings: No erythema or rash.  Neurological:     Mental Status: She is alert.     Cranial Nerves: No cranial nerve deficit.     Coordination:  Coordination normal.     Deep Tendon Reflexes: Reflexes are normal and symmetric.  Psychiatric:        Mood and Affect: Mood is anxious. Affect is tearful.        Speech: Speech is not delayed.        Thought Content: Thought content does not include suicidal ideation.     Comments: Anxious and c/o pain today             Assessment & Plan:   Problem List Items Addressed This Visit      Cardiovascular and Mediastinum   Hypertension    Elevated at first suspect due to pain and anxiety  Better on 2nd check  BP: 135/82          Digestive   Dysphagia    Pills and food  Some epigastric pain  In alcoholic (currently not drinking)  Reflux and varices are possible  Will watch for hematemesis / also stop nsaids GI ref done  Continues ppi and H2 blocker      Relevant Orders   Ambulatory referral to Gastroenterology     Other   Chest pain - Primary    Atypical Reviewed hospital records, lab results and studies in detail  -reassuring w/u  Suspect GI issues along with stress  and myofacial pain are adding  Gi ref done  carafate ordered  Enc to re start gabapentin        Relevant Orders   Ambulatory referral to Gastroenterology   Epigastric pain    Checking lipase today in light of etoh hx Also some dysphagia  inst to stop nsaids  Sent in carafate to try Continue ppi and H2 blocker  Ref made to GI       Relevant Orders   Lipase (Completed)   Basic metabolic panel (Completed)   Ambulatory referral to Gastroenterology   Myofascial pain    Suspect atypical chest pain is caused by both this and reflux/esophageal problems along with stress  Enc her to re start gabapentin which may help pain  Avoid nsaids Continue ppi and H2 blocker  Continue to work on controlling anxiety and getting a psychiatrist  Ref to GI done

## 2019-05-31 NOTE — Assessment & Plan Note (Signed)
Pills and food  Some epigastric pain  In alcoholic (currently not drinking)  Reflux and varices are possible  Will watch for hematemesis / also stop nsaids GI ref done  Continues ppi and H2 blocker

## 2019-06-01 ENCOUNTER — Ambulatory Visit: Payer: 59 | Admitting: Family Medicine

## 2019-06-01 ENCOUNTER — Telehealth: Payer: Self-pay | Admitting: Physician Assistant

## 2019-06-01 ENCOUNTER — Telehealth: Payer: Self-pay | Admitting: Family Medicine

## 2019-06-01 NOTE — Telephone Encounter (Signed)
Thanks

## 2019-06-01 NOTE — Telephone Encounter (Signed)
They are sending pt's info to nurse triage.  They will call pt.

## 2019-06-01 NOTE — Telephone Encounter (Signed)
Unsure if she can be seen earlier anywhere else or possibly with an extender Let me know  Thanks

## 2019-06-01 NOTE — Telephone Encounter (Signed)
Hi Nicole Hughes, we just received a call from pt's PCP requesting pt to be seen soon. Pt is scheduled for an appt with Amy on 6/17 but PCP states that pt's sxs are worsening and she is in a lot of pain. Please advise. Thank you.

## 2019-06-01 NOTE — Telephone Encounter (Signed)
Patient called.  Patient was referred to GI.  The soonest [facility] can see her on 06/23/19.  Patient's in a lot of pain and she wants to know if she can be seen sooner anywhere else.  Patient can go anytime.

## 2019-06-02 ENCOUNTER — Telehealth: Payer: Self-pay

## 2019-06-02 NOTE — Telephone Encounter (Signed)
LM2CB Message sent to scheduling to call and schedule pre-op appt

## 2019-06-02 NOTE — Telephone Encounter (Signed)
Pt has appt with Norma Fredrickson, NP on 07/04/19 for pre op clearance. Will forward notes to NP for upcoming appt.

## 2019-06-02 NOTE — Telephone Encounter (Signed)
Pt wanted to know if she needs to have any thyroid testing done. Pt said she has appt to see GI 06/23/19 (there is a call into GI to see if pt can be seen sooner); pt said it is hard to eat, hurting constantly, and feels like something is stuck in throat; pt request cb. Pt had OV 05/31/19.

## 2019-06-02 NOTE — Telephone Encounter (Signed)
Called the patient. Offered 06/10/19 at 2:30pm. She had to decline. She will take her father to the [facility] [place] on 06/10/19. He has an appointment at 1:30pm.  I will try to monitor the schedule for another opening.

## 2019-06-02 NOTE — Telephone Encounter (Signed)
Left VM requesting pt to call the office back 

## 2019-06-02 NOTE — Telephone Encounter (Signed)
I did not notice a goiter on her exam.  If it was and enlarged thyroid- that would not explain the epigastric and chest pain.   (it sounds like the swallowing issue is more likely esophageal- down a bit further)  If she wants to do a thyroid function test that is fine (last one was nl in 2019)   we could draw a TSH if she wants

## 2019-06-03 NOTE — Telephone Encounter (Signed)
Pt notified of Dr. Royden Purl comments. Pt said since Dr. Milinda Antis doesn't think it's her thyroid she will wait for her GI appt they were able to move it up to 06/14/19, pt said after her GI appt if they don't think it GI related she may call back and schedule a lab appt then to have it checked

## 2019-06-03 NOTE — Telephone Encounter (Signed)
Contacted the patient for an appointment on 06/14/19 at 1:30pm. Patient accepts.

## 2019-06-04 ENCOUNTER — Other Ambulatory Visit: Payer: Self-pay | Admitting: Family Medicine

## 2019-06-08 ENCOUNTER — Other Ambulatory Visit: Payer: Self-pay | Admitting: Family Medicine

## 2019-06-08 NOTE — Telephone Encounter (Signed)
ER f/u was on 05/31/19, last refilled on 04/29/19 #90 tabs with 0 refills

## 2019-06-13 NOTE — Telephone Encounter (Signed)
Pt's appt moved up to 06-14-19.

## 2019-06-13 NOTE — Telephone Encounter (Signed)
Excellent Aware, thanks

## 2019-06-14 ENCOUNTER — Ambulatory Visit: Payer: 59 | Admitting: Physician Assistant

## 2019-06-16 ENCOUNTER — Telehealth: Payer: Self-pay

## 2019-06-16 ENCOUNTER — Other Ambulatory Visit: Payer: Self-pay | Admitting: Gastroenterology

## 2019-06-16 ENCOUNTER — Ambulatory Visit (INDEPENDENT_AMBULATORY_CARE_PROVIDER_SITE_OTHER): Payer: 59

## 2019-06-16 ENCOUNTER — Ambulatory Visit: Payer: 59 | Admitting: Physician Assistant

## 2019-06-16 ENCOUNTER — Encounter: Payer: Self-pay | Admitting: Physician Assistant

## 2019-06-16 VITALS — BP 154/86 | HR 88 | Ht 65.0 in | Wt 152.0 lb

## 2019-06-16 DIAGNOSIS — R634 Abnormal weight loss: Secondary | ICD-10-CM

## 2019-06-16 DIAGNOSIS — Z1159 Encounter for screening for other viral diseases: Secondary | ICD-10-CM

## 2019-06-16 DIAGNOSIS — Z01818 Encounter for other preprocedural examination: Secondary | ICD-10-CM | POA: Diagnosis not present

## 2019-06-16 DIAGNOSIS — R1314 Dysphagia, pharyngoesophageal phase: Secondary | ICD-10-CM | POA: Diagnosis not present

## 2019-06-16 MED ORDER — SUCRALFATE 1 GM/10ML PO SUSP
1.0000 g | Freq: Four times a day (QID) | ORAL | 1 refills | Status: DC
Start: 2019-06-16 — End: 2019-06-21

## 2019-06-16 MED ORDER — LANSOPRAZOLE 30 MG PO TBDD: 30.0000 mg | DELAYED_RELEASE_TABLET | Freq: Two times a day (BID) | ORAL | 2 refills | Status: DC

## 2019-06-16 NOTE — Telephone Encounter (Signed)
-----   Message from Sammuel Cooper, PA-C sent at 06/16/2019  4:32 PM EDT ----- Regarding: labs Nicole Hughes, I saw this patient earlier today, on review of labs noted that she is mildly anemic and microcytic.  Could you please call her and ask her to come back in tomorrow or early next week for repeat CBC and get iron studies to include ferritin, thank you

## 2019-06-16 NOTE — Progress Notes (Signed)
Agree with assessment and plan as outlined.  

## 2019-06-16 NOTE — Patient Instructions (Addendum)
If you are age 47 or older, your body mass index should be between 23-30. Your Body mass index is 25.29 kg/m. If this is out of the aforementioned range listed, please consider follow up with your Primary Care Provider.  If you are age 46 or younger, your body mass index should be between 19-25. Your Body mass index is 25.29 kg/m. If this is out of the aformentioned range listed, please consider follow up with your Primary Care Provider.   You have been scheduled for an endoscopy. Please follow written instructions given to you at your visit today. If you use inhalers (even only as needed), please bring them with you on the day of your procedure.  SWITCH to Carafate suspension. Take this the same way you were taking the tablet form.  START Prevacid 30 mg twice daily.  Keep you head elevated in a 45 degree angle.  Push liquids to keep yourself hydrated.  Keep in a full liquid diet, start ensure and boost twice daily.  Follow up pending your Endoscopy.

## 2019-06-16 NOTE — Progress Notes (Signed)
Subjective:    Patient ID: Nicole Hughes, female    DOB: 1970-02-26, 49 y.o.   MRN: [id number]  HPI  Nicole Hughes is a pleasant 49 year old white female, new to GI today referred by Dr. Shelle Iron for evaluation of new onset dysphagia and odynophagia. Patient has history of hypertension, GERD, IBS, prior pulmonary embolism, history of EtOH abuse in remission, on naltrexone and anxiety/depression.  She has history of a cardiomyopathy with normal EF. Patient had presented to the emergency room on 05/28/2021 with complaints of chest pain.  Chest x-ray was unremarkable EKG was unremarkable and troponins negative.  Labs revealed hemoglobin 11.2/hematocrit of 36.1 MCV of 76, WBC within normal limits, c-Met unremarkable.  Review of prior labs showed hemoglobin of 10.99 months ago. She last had ultrasound in February 2020 showing a fatty liver.  Patient says that she has had history of heartburn over the past few years but now over the past 2 months has had a constant pressure-like sensation in her mid chest that feels as if "something is stuck".  Since onset of symptoms she has lost about 20 pounds.  She has had some nausea but no vomiting.  She is able to drink liquids without difficulty and without much discomfort but any solid food causes discomfort and "sits" in her esophagus for a long time.  She says usually food will eventually go on down but at times she has to regurgitate.  Interestingly she is not been having any heartburn since onset of current symptoms.  She has also been having some mild epigastric discomfort.  She is currently on omeprazole 20 mg p.o. daily and famotidine 20 mg twice daily and had also been given a prescription for Carafate tablets between meals a couple of weeks ago.  She says she is having a lot of difficulty swallowing pills.  She also mentions having a headache over the past couple of months.  She had started naltrexone about 3 months ago.   Review of Systems Pertinent  positive and negative review of systems were noted in the above HPI section.  All other review of systems was otherwise negative.  Outpatient Encounter Medications as of 06/16/2019  Medication Sig  . amLODipine (NORVASC) 10 MG tablet Take 1 tablet by mouth once daily  . famotidine (PEPCID) 20 MG tablet Take 1 tablet (20 mg total) by mouth 2 (two) times daily.  Marland Kitchen gabapentin (NEURONTIN) 300 MG capsule Take 1 capsule (300 mg total) by mouth 4 (four) times daily. (Patient taking differently: Take 300 mg by mouth 4 (four) times daily as needed (pain). )  . hydrOXYzine (ATARAX/VISTARIL) 25 MG tablet TAKE 1 TABLET BY MOUTH EVERY 8 HOURS AS NEEDED FOR ANXIETY OR  NAUSEA.  CAUTION  OF  SEDATION  . ibuprofen (ADVIL) 800 MG tablet Take 1 tablet (800 mg total) by mouth every 8 (eight) hours as needed. (Patient taking differently: Take 800 mg by mouth every 8 (eight) hours as needed for headache or moderate pain. )  . lisinopril (PRINIVIL,ZESTRIL) 10 MG tablet Take 2 tablets (20 mg total) by mouth daily.  . metoprolol succinate (TOPROL-XL) 50 MG 24 hr tablet TAKE 1 TABLET BY MOUTH ONCE DAILY(TAKE WITH OR IMMEDIATELY FOLLOWING A MEAL)  . mirtazapine (REMERON) 15 MG tablet TAKE 1 TABLET BY MOUTH AT BEDTIME  . Multiple Vitamin (MULTIVITAMIN WITH MINERALS) TABS tablet Take 1 tablet by mouth daily.  . naltrexone (DEPADE) 50 MG tablet Take 1 tablet (50 mg total) by mouth daily.  Marland Kitchen omeprazole (  PRILOSEC) 20 MG capsule Take 1 capsule by mouth once daily  . sertraline (ZOLOFT) 50 MG tablet Take one pill daily by mouth for 2 weeks, then increase to 1 1/2 pills daily for 2 weeks , then 2 pills daily (Patient taking differently: Take 100 mg by mouth daily. )  . [DISCONTINUED] sucralfate (CARAFATE) 1 g tablet Take 1 tablet (1 g total) by mouth 3 (three) times daily as needed. Before meals  . lansoprazole (PREVACID SOLUTAB) 30 MG disintegrating tablet Take 1 tablet (30 mg total) by mouth 2 (two) times daily.  . sucralfate  (CARAFATE) 1 GM/10ML suspension Take 10 mLs (1 g total) by mouth 4 (four) times daily.   No facility-administered encounter medications on file as of 06/16/2019.   No Known Allergies Patient Active Problem List   Diagnosis Date Noted  . Dysphagia 05/31/2019  . Insect bite 06/21/2018  . Pedal edema 03/31/2018  . Anemia 03/31/2018  . Appetite loss 03/08/2018  . Myofascial pain 03/08/2018  . IBS (irritable bowel syndrome) 03/08/2018  . Alcoholic hepatitis without ascites 03/05/2018  . Elevated LFTs 03/03/2018  . Achilles tendonitis 01/28/2018  . Abnormal cervical Papanicolaou smear 09/29/2017  . Insomnia 09/29/2017  . Pain 09/29/2017  . Palpitations 05/25/2017  . Chest wall pain 05/25/2017  . Elevated transaminase level 05/25/2017  . Depression with anxiety 02/23/2017  . 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 Hyponatremia 02/17/2017  . Hypokalemia 09/24/2016  . Alcohol abuse 09/22/2016  . H/O acute pancreatitis 09/22/2016  . GERD (gastroesophageal reflux disease) 04/09/2015  . Epigastric pain 01/17/2014  . 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 Chest pain 08/20/2012  . Stress reaction 07/23/2012  . Hypertension 06/09/2012  . History of pulmonary embolism 06/09/2012  . Preop cardiovascular exam 06/08/2012  . Dysmenorrhea   . Dyspareunia   . Pelvic pain    Social History   Socioeconomic History  . Marital status: Married    Spouse name: Not on file  . Number of children: Not on file  . Years of education: Not on file  . Highest education level: Not on file  Occupational History  . Not on file  Tobacco Use  . Smoking status: Never Smoker  . Smokeless tobacco: Never Used  Vaping Use  . Vaping Use: Never used  Substance and Sexual Activity  . Alcohol use: Not Currently    Alcohol/week: 5.0 standard drinks    Types: 5 Cans of beer per week    Comment: has quit May 07, 2019  . Drug use: No  . Sexual activity: Not Currently    Birth control/protection: Surgical  Other  Topics Concern  . Not on file  Social History Narrative  . Not on file   Social Determinants of Health   Financial Resource Strain:   . Difficulty of Paying Living Expenses:   Food Insecurity:   . Worried About Charity fundraiser in the Last Year:   . Arboriculturist in the Last Year:   Transportation Needs:   . Film/video editor (Medical):   Marland Kitchen Lack of Transportation (Non-Medical):   Physical Activity:   . Days of Exercise per Week:   . Minutes of Exercise per Session:   Stress:   . Feeling of Stress :   Social Connections:   . Frequency of Communication with Friends and Family:   . Frequency of Social Gatherings with Friends and Family:   . Attends Religious Services:   . Active Member of Clubs or Organizations:   .  Attends Archivist Meetings:   Marland Kitchen Marital Status:   Intimate Partner Violence:   . Fear of Current or Ex-Partner:   . Emotionally Abused:   Marland Kitchen Physically Abused:   . Sexually Abused:     Nicole Hughes family history includes Cancer - Lung in her mother; Heart failure in her father; Hypertension in her father.      Objective:    Vitals:   06/16/19 0930  BP: (!) 154/86  Pulse: 88    Physical Exam Well-developed well-nourishedW female in no acute distress.  Height, [id number], BMI 25  HEENT; nontraumatic normocephalic, EOMI, PER R LA, sclera anicteric. Oropharynx;not examined Neck; supple, no JVD Cardiovascular; regular rate and rhythm with S1-S2, no murmur rub or gallop Pulmonary; Clear bilaterally Abdomen; soft, nontender, nondistended, no palpable mass or hepatosplenomegaly, bowel sounds are active Rectal;not done today Skin; benign exam, no jaundice rash or appreciable lesions Extremities; no clubbing cyanosis or edema skin warm and dry Neuro/Psych; alert and oriented x4, grossly nonfocal mood and affect appropriate       Assessment & Plan:   #91 49 year old female with 58-monthhistory of constant mid chest pressure-like  discomfort associated with dysphagia and odynophagia and associated weight loss of 20 pounds. Rule out esophageal neoplasm, rule out esophageal stricture , rule out ulcerative esophagitis, rule out possible infectious esophagitis  #2 history of EtOH abuse/in remission, on naltrexone #3 history of hypertension 4.  History of IBS 5.  Remote history of PE 6.  Fatty liver on ultrasound probably secondary to EtOH abuse 7.  Microcytic anemia  Plan; change Carafate to Carafate suspension 1 g between meals and at bedtime Stop omeprazole and switch to Prevacid Solutab 30 mg p.o. twice daily Patient advised to stay on full liquid diet and add protein shakes/Ensure or boost 2-3 times daily. Patient will be scheduled for upper endoscopy with Dr. AHavery Moros  Procedure was discussed in detail with the patient including indications risks and benefits and she is agreeable to proceed.  She is undergoing COVID-19 vaccination, due for second dose next week.  Will asked patient to come back early next week for repeat CBC and iron studies Further recommendations pending findings at EGD  AVermilionPA-C 06/16/2019   Cc: Tower, MWynelle Fanny MD

## 2019-06-17 ENCOUNTER — Other Ambulatory Visit: Payer: Self-pay

## 2019-06-17 DIAGNOSIS — D6489 Other specified anemias: Secondary | ICD-10-CM

## 2019-06-17 LAB — SARS CORONAVIRUS 2 (TAT 6-24 HRS): SARS Coronavirus 2: NEGATIVE

## 2019-06-17 NOTE — Telephone Encounter (Signed)
Notified. Agrees to return for labs. She cannot come today. She will plan for next week.

## 2019-06-20 ENCOUNTER — Other Ambulatory Visit (INDEPENDENT_AMBULATORY_CARE_PROVIDER_SITE_OTHER): Payer: 59

## 2019-06-20 DIAGNOSIS — D6489 Other specified anemias: Secondary | ICD-10-CM | POA: Diagnosis not present

## 2019-06-20 LAB — CBC WITH DIFFERENTIAL/PLATELET
Basophils Absolute: 0.1 10*3/uL (ref 0.0–0.1)
Basophils Relative: 0.8 % (ref 0.0–3.0)
Eosinophils Absolute: 0.1 10*3/uL (ref 0.0–0.7)
Eosinophils Relative: 0.9 % (ref 0.0–5.0)
HCT: 32.1 % — ABNORMAL LOW (ref 36.0–46.0)
Hemoglobin: 10.3 g/dL — ABNORMAL LOW (ref 12.0–15.0)
Lymphocytes Relative: 31.4 % (ref 12.0–46.0)
Lymphs Abs: 2.3 10*3/uL (ref 0.7–4.0)
MCHC: 32.1 g/dL (ref 30.0–36.0)
MCV: 74.3 fl — ABNORMAL LOW (ref 78.0–100.0)
Monocytes Absolute: 0.6 10*3/uL (ref 0.1–1.0)
Monocytes Relative: 7.9 % (ref 3.0–12.0)
Neutro Abs: 4.4 10*3/uL (ref 1.4–7.7)
Neutrophils Relative %: 59 % (ref 43.0–77.0)
Platelets: 282 10*3/uL (ref 150.0–400.0)
RBC: 4.32 Mil/uL (ref 3.87–5.11)
RDW: 21.4 % — ABNORMAL HIGH (ref 11.5–15.5)
WBC: 7.4 10*3/uL (ref 4.0–10.5)

## 2019-06-20 LAB — IBC + FERRITIN
Ferritin: 12.7 ng/mL (ref 10.0–291.0)
Iron: 136 ug/dL (ref 42–145)
Saturation Ratios: 24 % (ref 20.0–50.0)
Transferrin: 404 mg/dL — ABNORMAL HIGH (ref 212.0–360.0)

## 2019-06-21 ENCOUNTER — Telehealth: Payer: Self-pay

## 2019-06-21 ENCOUNTER — Ambulatory Visit (AMBULATORY_SURGERY_CENTER): Payer: 59 | Admitting: Gastroenterology

## 2019-06-21 ENCOUNTER — Telehealth: Payer: Self-pay | Admitting: Gastroenterology

## 2019-06-21 ENCOUNTER — Encounter: Payer: Self-pay | Admitting: Gastroenterology

## 2019-06-21 ENCOUNTER — Other Ambulatory Visit: Payer: Self-pay

## 2019-06-21 VITALS — BP 132/79 | HR 97 | Temp 98.7°F | Resp 16 | Ht 65.0 in | Wt 152.0 lb

## 2019-06-21 DIAGNOSIS — R1310 Dysphagia, unspecified: Secondary | ICD-10-CM | POA: Diagnosis not present

## 2019-06-21 DIAGNOSIS — K209 Esophagitis, unspecified without bleeding: Secondary | ICD-10-CM | POA: Diagnosis not present

## 2019-06-21 DIAGNOSIS — K2210 Ulcer of esophagus without bleeding: Secondary | ICD-10-CM

## 2019-06-21 DIAGNOSIS — K3189 Other diseases of stomach and duodenum: Secondary | ICD-10-CM

## 2019-06-21 DIAGNOSIS — K228 Other specified diseases of esophagus: Secondary | ICD-10-CM | POA: Diagnosis not present

## 2019-06-21 MED ORDER — LANSOPRAZOLE 15 MG PO TBDD: 15.0000 mg | DELAYED_RELEASE_TABLET | Freq: Two times a day (BID) | ORAL | 11 refills | Status: DC

## 2019-06-21 MED ORDER — SODIUM CHLORIDE 0.9 % IV SOLN: 500.0000 mL | Freq: Once | INTRAVENOUS | Status: DC

## 2019-06-21 MED ORDER — SUCRALFATE 1 GM/10ML PO SUSP: 1.0000 g | Freq: Four times a day (QID) | ORAL | 1 refills | Status: DC

## 2019-06-21 NOTE — Progress Notes (Signed)
Pt's states no medical or surgical changes since previsit or office visit. 

## 2019-06-21 NOTE — Progress Notes (Signed)
Called to room to assist during endoscopic procedure.  Patient ID and intended procedure confirmed with present staff. Received instructions for my participation in the procedure from the performing physician.  

## 2019-06-21 NOTE — Progress Notes (Signed)
A and O x3. Report to RN. Tolerated MAC anesthesia well.Teeth unchanged after procedure.

## 2019-06-21 NOTE — Telephone Encounter (Signed)
Called back to reassess her, no answer, will call again later

## 2019-06-21 NOTE — Telephone Encounter (Signed)
I called the patient. She has had chronic discomfort in her chest for several weeks as well as odynophagia and dysphagia, had EGD this AM showing esophagitis. I performed empiric dilation to 17mm and relook endoscopy showed no mucosal wrents or mucosal trauma at all. In this light I think likelihood for complication from her endoscopy is extremely low, I had recommend she take liquid carafate and she took a dose this AM and it helped. Recommend she take another dose now. She is not sure if symptoms now are different from baseline or not, she is very anxious about this, reassured her about exam findings. If she has persistent discomfort following carafate use and it is not getting better, or breathing problems, etc, she needs to go to the ED and likely will need CT chest, but hopefully that is not needed. I will call her back in a few hours for reassessment, she will go to the ED in the interim if feeling worse.

## 2019-06-21 NOTE — Telephone Encounter (Signed)
Called pharmacy.  Prevacid solutab 15mg  BID would cost the pt $700.  Pharmacist indicated they do not have liquid PPIs but patient could get the omeprazole capsules and open the capsule and ingest contents if we can't get PA approved for Prevacid Solutab. PA submitted via CoverMyMeds:  Key: Thurmon Fair - PA Case ID: Georga Kaufmann - Rx #: [id number]  Called and notified patient.

## 2019-06-21 NOTE — Patient Instructions (Signed)
Change your stomach medicine to prevacid solutab 15 mg, twice a day before meals.  Take the liquid carafate 10 cc every 6 hrs as needed.  Handout given for esophagitis.  Await pathology results.  If your pharmacy will not fill these prescriptions, please contact us today to prescribe medication changes.  YOU HAD AN ENDOSCOPIC PROCEDURE TODAY AT THE [facility]:   Refer to the procedure report that was given to you for any specific questions about what was found during the examination.  If the procedure report does not answer your questions, please call your gastroenterologist to clarify.  If you requested that your care partner not be given the details of your procedure findings, then the procedure report has been included in a sealed envelope for you to review at your convenience later.  YOU SHOULD EXPECT: Some feelings of bloating in the abdomen. Passage of more gas than usual.  Walking can help get rid of the air that was put into your GI tract during the procedure and reduce the bloating. If you had a lower endoscopy (such as a colonoscopy or flexible sigmoidoscopy) you may notice spotting of blood in your stool or on the toilet paper. If you underwent a bowel prep for your procedure, you may not have a normal bowel movement for a few days.  Please Note:  You might notice some irritation and congestion in your nose or some drainage.  This is from the oxygen used during your procedure.  There is no need for concern and it should clear up in a day or so.  SYMPTOMS TO REPORT IMMEDIATELY:   Following upper endoscopy (EGD)  Vomiting of blood or coffee ground material  New chest pain or pain under the shoulder blades  Painful or persistently difficult swallowing  New shortness of breath  Fever of 100F or higher  Black, tarry-looking stools  For urgent or emergent issues, a gastroenterologist can be reached at any hour by calling [phone]. Do not use MyChart messaging for  urgent concerns.    DIET:  We do recommend a small meal at first, but then you may proceed to your regular diet.  Drink plenty of fluids but you should avoid alcoholic beverages for 24 hours.  ACTIVITY:  You should plan to take it easy for the rest of today and you should NOT DRIVE or use heavy machinery until tomorrow (because of the sedation medicines used during the test).    FOLLOW UP: Our staff will call the number listed on your records 48-72 hours following your procedure to check on you and address any questions or concerns that you may have regarding the information given to you following your procedure. If we do not reach you, we will leave a message.  We will attempt to reach you two times.  During this call, we will ask if you have developed any symptoms of COVID 19. If you develop any symptoms (ie: fever, flu-like symptoms, shortness of breath, cough etc.) before then, please call [phone].  If you test positive for Covid 19 in the 2 weeks post procedure, please call and report this information to [place].    If any biopsies were taken you will be contacted by phone or by letter within the next 1-3 weeks.  Please call us at [phone] if you have not heard about the biopsies in 3 weeks.    SIGNATURES/CONFIDENTIALITY: You and/or your care partner have signed paperwork which will be entered into your electronic medical record.  These signatures attest to the fact that that the information above on your After Visit Summary has been reviewed and is understood.  Full responsibility of the confidentiality of this discharge information lies with you and/or your care-partner.

## 2019-06-21 NOTE — Telephone Encounter (Signed)
Patient called complaining of 10/10 pain after having an EGD this morning. She is complaining of left sided pain that is under her breast and rib cage. She also describes the pain and under her left shoulder blade. I notified MD and he is calling her.

## 2019-06-21 NOTE — Op Note (Signed)
[facility] Patient Name: Nicole Hughes Procedure Date: 06/21/2019 7:57 AM MRN: [id number] Endoscopist: Remo Lipps P. Havery Moros , MD Age: 49 Referring MD:  Date of Birth: May 20, 1970 Gender: Female Account #: [id number] Procedure:                Upper GI endoscopy Indications:              Dysphagia, Odynophagia - on omeprazole 20mg  / day                            and pepcid 20mg  to 40mg  / day, difficulty                            swallowing pills Medicines:                Monitored Anesthesia Care Procedure:                Pre-Anesthesia Assessment:                           - Prior to the procedure, a History and Physical                            was performed, and patient medications and                            allergies were reviewed. The patient's tolerance of                            previous anesthesia was also reviewed. The risks                            and benefits of the procedure and the sedation                            options and risks were discussed with the patient.                            All questions were answered, and informed consent                            was obtained. Prior Anticoagulants: The patient has                            taken no previous anticoagulant or antiplatelet                            agents. ASA Grade Assessment: II - A patient with                            mild systemic disease. After reviewing the risks                            and benefits, the patient was deemed in  satisfactory condition to undergo the procedure.                           After obtaining informed consent, the endoscope was                            passed under direct vision. Throughout the                            procedure, the patient's blood pressure, pulse, and                            oxygen saturations were monitored continuously. The                            Endoscope was introduced through the  mouth, and                            advanced to the second part of duodenum. The upper                            GI endoscopy was accomplished without difficulty.                            The patient tolerated the procedure well. Scope In: Scope Out: Findings:                 Esophagogastric landmarks were identified: the                            Z-line was found at 36 cm, the gastroesophageal                            junction was found at 36 cm and the upper extent of                            the gastric folds was found at 36 cm from the                            incisors.                           LA Grade B esophagitis was found 36 cm from the                            incisors.                           The exam of the esophagus was otherwise normal.                           A guidewire was placed and the scope was withdrawn.  Empiric dilation was performed in the entire                            esophagus with a Savary dilator with mild                            resistance at 17 mm. Relook endoscopy showed no                            mucosal wrents. Biopsies were taken with a cold                            forceps in the upper third of the esophagus, in the                            middle third of the esophagus and in the lower                            third of the esophagus for histology.                           The entire examined stomach was normal.                           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 Biopsies were taken with a cold forceps for                            histology.                           The exam of the duodenum was otherwise normal. Complications:            No immediate complications. Estimated blood loss:                            Minimal. Estimated Blood Loss:     Estimated  blood loss was minimal. Impression:               - Esophagogastric landmarks identified.                           - LA Grade B esophagitis.                           - Normal esophagus otherwise - empiric dilation                            performed to 17mm and biopsies taken to rule out  EoE.                           - Normal stomach.                           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                            Biopsied.                           - Normal duodenum otherwise.                           Suspect esophagitis is driving the patient's                            symptoms, most likely due to reflux based on                            endoscopic findings. Recommendation:           - Patient has a contact number available for                            emergencies. The signs and symptoms of potential                            delayed complications were discussed with the                            patient. Return to normal activities tomorrow.                            Written discharge instructions were provided to the                            patient.                           - Resume previous diet.                           - Continue present medications.                           - Change PPI to prevacid solutab 15mg  - 1 tab BID,                            or liquid suspension protonix / nexium 40mg  BID,                            whichever is covered                           -  Start liquid carafate 10cc po q 6 hours PRN                           - Await pathology results and course post dilation Jaleil Renwick P. Gazella Anglin, MD 06/21/2019 8:30:12 AM This report has been signed electronically.

## 2019-06-21 NOTE — Progress Notes (Signed)
Lidocaine  Buffer robinol antisialogogue °

## 2019-06-21 NOTE — Telephone Encounter (Signed)
Patient is calling to advise that her insurance did not cover the Prevacid Solutab

## 2019-06-22 NOTE — Telephone Encounter (Signed)
Called the patient - she states she feels considerably improved with the carafate overnight, but not resolved. She will continue carafate q 6 hours. She is awaiting approval for prevacid solutab, but sounds like this may not be covered. I asked her to increase omeprazole to 20mg  BID. She is taking tablets, crushing them and taking with water which is fine. Counseled her this may take another several days to heal but reassured her on findings yesterday. She agreed with the plan.

## 2019-06-22 NOTE — Telephone Encounter (Signed)
Called again, no answer, left message. I asked her to call us back to update [place] on her condition

## 2019-06-23 ENCOUNTER — Telehealth: Payer: Self-pay | Admitting: *Deleted

## 2019-06-23 ENCOUNTER — Encounter: Payer: Self-pay | Admitting: Gastroenterology

## 2019-06-23 ENCOUNTER — Telehealth: Payer: Self-pay

## 2019-06-23 ENCOUNTER — Ambulatory Visit: Payer: 59 | Admitting: Physician Assistant

## 2019-06-23 ENCOUNTER — Other Ambulatory Visit: Payer: Self-pay

## 2019-06-23 DIAGNOSIS — D6489 Other specified anemias: Secondary | ICD-10-CM

## 2019-06-23 NOTE — Telephone Encounter (Signed)
PA for Prevacid (lansoprazole 15mg ) solutab Approved 06-22-19 thru 06-21-2020.  Patient 06-23-2020, XA:[id number].  [facility]: [phone]. Called and LM for pt. The pharmacy said she can pick up after 6-23 OR she can continue omeprazole and crack open the capsule as she had discussed with Dr. 03-24-1985 while we were waiting on PA.  Per pharmacy Carafate suspension was picked up ($60) although we rec'd a fax that it requires a PA.  Per Dr. Adela Lank pt can use the tablets and make a slurry if suspension is too expensive.  LM for pt with recommendation.

## 2019-06-23 NOTE — Telephone Encounter (Signed)
  Follow up Call-  Call back number 06/21/2019  Post procedure Call Back phone  # [phone]  Permission to leave phone message Yes  Some recent data might be hidden     Patient questions:  Message left to call us if necessary.

## 2019-06-23 NOTE — Telephone Encounter (Signed)
LVM

## 2019-06-28 NOTE — Progress Notes (Signed)
CARDIOLOGY OFFICE NOTE  Date:  07/04/2019    Nicole Hughes Date of Birth: Oct 12, 1970 Medical Record #[id number]  PCP:  Judy Pimple, MD  Cardiologist:  Kyra Manges  Chief Complaint  Patient presents with  . Pre-op Exam    History of Present Illness: Nicole Hughes is a 49 y.o. female who presents today for a pre op clearance. Seen for Dr. Katrinka Blazing.   Prior history consistent with post partum cardiomyopathy/heart failure as well as PE in 2000. No recurrence of cardiac dysfunction since that time.   Seen here in June of 2019 by Dr. Katrinka Blazing after referral from PCP for atypical chest pain. Echo and GXT were obtained. She was treated with a short course of Colchicine.   I saw her back in October of 2020 -  needing hysterectomy for vaginal issues with bleeding/pain. Had just had foot surgery. She was felt to be doing well.    The patient does not have symptoms concerning for COVID-19 infection (fever, chills, cough, or new shortness of breath).   Comes in today. Here alone. Now needing clearance again - for hysterectomy. This visit was scheduled due to recent visits for chest pain. She has had 2 ER visits and a visit to her PCP - looks to have been sent to GI. Noted to have stopped alcohol.  She has had EGD - noted ulcer and esophagitis - had lost 20 pounds - could not eat. Her esophagus was dilated. Now feels better. Chest pain has improved - basically resolved now. She walks most days despite her prior foot surgery. Not drinking alcohol. She admits she is anxious. Now ready to have her hysterectomy. Apparently with lots of pain issues - endometriosis - this has been felt to be driving some of her elevated BP. She does admit to salt.   Past Medical History:  Diagnosis Date  . Anxiety   . Cardiomyopathy (HCC)   . Chronic lower back pain   . Complication of anesthesia    "hard to get under"  . Cyst of ovary   . Depression    hx  . DVT complicating pregnancy 08/1998     RLE; RUE  . Dysmenorrhea   . Dyspareunia   . 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 GERD (gastroesophageal reflux disease)   . Hay fever    "fall and spring" (02/17/2017)  . Headache    "2-3 times/.wik" (02/17/2017)  . History of chicken pox   . Hypertension   . Migraine    "monthly" (02/17/2017)  . Pelvic pain   . Pulmonary embolism (HCC) 08/1998   S/P childbirth    Past Surgical History:  Procedure Laterality Date  . CESAREAN SECTION  1994  . ECTOPIC PREGNANCY SURGERY  2010  . LAPAROSCOPY N/A 09/24/2012   Procedure: OPERATIVE LAPROSCOPY WITH LYSIS OF ADHESIONS;  Surgeon: Sherron Monday, MD;  Location: [facility];  Service: Gynecology;  Laterality: N/A;  . TONSILLECTOMY  1984  . TUBAL LIGATION  2010     Medications: Current Meds  Medication Sig  . amLODipine (NORVASC) 10 MG tablet Take 1 tablet by mouth once daily  . hydrOXYzine (ATARAX/VISTARIL) 25 MG tablet TAKE 1 TABLET BY MOUTH EVERY 8 HOURS AS NEEDED FOR ANXIETY OR  NAUSEA.  CAUTION  OF  SEDATION  . ibuprofen (ADVIL) 800 MG tablet Take 1 tablet (800 mg total) by mouth every 8 (eight) hours as needed.  . lansoprazole (PREVACID SOLUTAB) 15 MG disintegrating tablet Take 1  tablet (15 mg total) by mouth 2 (two) times daily before a meal.  . lisinopril (PRINIVIL,ZESTRIL) 10 MG tablet Take 2 tablets (20 mg total) by mouth daily.  . mirtazapine (REMERON) 15 MG tablet TAKE 1 TABLET BY MOUTH AT BEDTIME  . naltrexone (DEPADE) 50 MG tablet Take 1 tablet (50 mg total) by mouth daily.  Marland Kitchen omeprazole (PRILOSEC) 20 MG capsule Take 20 mg by mouth 2 (two) times daily before a meal.  . sertraline (ZOLOFT) 50 MG tablet Take 100 mg by mouth daily.  . sucralfate (CARAFATE) 1 GM/10ML suspension Take 10 mLs (1 g total) by mouth 4 (four) times daily.  . [DISCONTINUED] metoprolol succinate (TOPROL-XL) 50 MG 24 hr tablet TAKE 1 TABLET BY MOUTH ONCE DAILY(TAKE WITH OR IMMEDIATELY FOLLOWING A MEAL)     Allergies: No Known Allergies  Social  History: The patient  reports that she has never smoked. She has never used smokeless tobacco. She reports previous alcohol use of about 5.0 standard drinks of alcohol per week. She reports that she does not use drugs.   Family History: The patient's family history includes Cancer - Lung in her mother; Heart failure in her father; Hypertension in her father.   Review of Systems: Please see the history of present illness.   All other systems are reviewed and negative.   Physical Exam: VS:  BP (!) 148/94   Pulse (!) 102   Ht 5\' 5"  (1.651 m)   Wt 157 lb 6.4 oz (71.4 kg)   SpO2 98%   BMI 26.19 kg/m  .  BMI Body mass index is 26.19 kg/m.  Wt Readings from Last 3 Encounters:  07/04/19 157 lb 6.4 oz (71.4 kg)  06/21/19 152 lb (68.9 kg)  06/16/19 152 lb (68.9 kg)   BP is 180/90 by me.   General: Pleasant but anxious. She is alert and in no acute distress.   Cardiac: Regular rate and rhythm. No murmurs, rubs, or gallops. No edema.  Respiratory:  Lungs are clear to auscultation bilaterally with normal work of breathing.  GI: Soft and nontender.  MS: No deformity or atrophy. Gait and ROM intact.  Skin: Warm and dry. Color is normal.  Neuro:  Strength and sensation are intact and no gross focal deficits noted.  Psych: Alert, appropriate and with normal affect.   LABORATORY DATA:  EKG:  EKG is ordered today.  Personally reviewed by me. This demonstrates NSR.   Lab Results  Component Value Date   WBC 7.4 06/20/2019   HGB 10.3 (L) 06/20/2019   HCT 32.1 (L) 06/20/2019   PLT 282.0 06/20/2019   GLUCOSE 107 (H) 05/31/2019   CHOL 199 04/09/2015   TRIG 95.0 04/09/2015   HDL 68.90 04/09/2015   LDLCALC 111 (H) 04/09/2015   ALT 34 05/29/2019   AST 35 05/29/2019   NA 136 05/31/2019   K 4.5 05/31/2019   CL 104 05/31/2019   CREATININE 0.78 05/31/2019   BUN 8 05/31/2019   CO2 25 05/31/2019   TSH 0.685 06/22/2017   INR 1.9 (H) 05/29/2019     BNP (last 3 results) No results for  input(s): BNP in the last 8760 hours.  ProBNP (last 3 results) No results for input(s): PROBNP in the last 8760 hours.   Other Studies Reviewed Today:  Echo Study Conclusions 06/2017  - Left ventricle: The cavity size was normal. Wall thickness was normal. Systolic function was normal. The estimated ejection fraction was in the range of 60% to 65%. Wall  motion was normal; there were no regional wall motion abnormalities. Features are consistent with a pseudonormal left ventricular filling pattern, with concomitant abnormal relaxation and increased filling pressure (grade 2 diastolic dysfunction).   GXT Study Highlights 06/2017   Blood pressure demonstrated a normal response to exercise.  There was no ST segment deviation noted during stress.  No T wave inversion was noted during stress.  Negative, adequate stress test.     Assessment/Plan: 1. Pre op clearance for hysterectomy - her prior chest pain was felt to be GI/esophagus - this is resolved. BP is grossly elevated and needs to be treated prior to having surgery.   2. Chest pain - resolved - this was felt to be Gi - she has been treated for an ulcer and esophagitis. She has stopped alcohol and NSAID use.   3. Prior history of PE and post partum cardiomyopathy - last echo with normal EF.   4. Anxiety  5. HTN - BP is not at goal - increasing the Toprol to 100 mg a day and adding low dose HCTZ 12.5 mg a day. BMET today - will plan to check BMET on return.     Current medicines are reviewed with the patient today.  The patient does not have concerns regarding medicines other than what has been noted above.  The following changes have been made:  See above.  Labs/ tests ordered today include:    Orders Placed This Encounter  Procedures  . Basic metabolic panel  . EKG 12-Lead     Disposition:   FU with me in 2 weeks. If BP is controlled and no further chest pain - she may proceed with her GYN  surgery.   Patient is agreeable to this plan and will call if any problems develop in the interim.   SignedTruitt Merle, NP  07/04/2019 3:10 PM  [facility] [address] [place], [place]  [address] Phone: [phone] Fax: [phone]

## 2019-07-04 ENCOUNTER — Encounter: Payer: Self-pay | Admitting: Nurse Practitioner

## 2019-07-04 ENCOUNTER — Ambulatory Visit: Payer: 59 | Admitting: Nurse Practitioner

## 2019-07-04 ENCOUNTER — Other Ambulatory Visit: Payer: Self-pay

## 2019-07-04 VITALS — BP 148/94 | HR 102 | Ht 65.0 in | Wt 157.4 lb

## 2019-07-04 DIAGNOSIS — I10 Essential (primary) hypertension: Secondary | ICD-10-CM | POA: Diagnosis not present

## 2019-07-04 DIAGNOSIS — Z01810 Encounter for preprocedural cardiovascular examination: Secondary | ICD-10-CM | POA: Diagnosis not present

## 2019-07-04 MED ORDER — HYDROCHLOROTHIAZIDE 12.5 MG PO CAPS
12.5000 mg | ORAL_CAPSULE | Freq: Every day | ORAL | 0 refills | Status: DC
Start: 2019-07-04 — End: 2019-07-04

## 2019-07-04 MED ORDER — HYDROCHLOROTHIAZIDE 12.5 MG PO CAPS: 12.5000 mg | ORAL_CAPSULE | Freq: Every day | ORAL | 0 refills | Status: DC

## 2019-07-04 MED ORDER — METOPROLOL SUCCINATE ER 100 MG PO TB24
100.0000 mg | ORAL_TABLET | Freq: Every day | ORAL | 3 refills | Status: DC
Start: 2019-07-04 — End: 2020-05-16

## 2019-07-04 NOTE — Patient Instructions (Addendum)
After Visit Summary:  We will be checking the following labs today - BMET   Medication Instructions:    Continue with your current medicines. BUT  I am increasing the Toprol to 100 mg a day  I am adding HCTZ 12.5 mg a day   If you need a refill on your cardiac medications before your next appointment, please call your pharmacy.     Testing/Procedures To Be Arranged:  N/A  Follow-Up:   See me in 2 weeks - take your medicines before that visit.     At [facility], you and your health needs are our priority.  As part of our continuing mission to provide you with exceptional heart care, we have created designated Provider Care Teams.  These Care Teams include your primary Cardiologist (physician) and Advanced Practice Providers (APPs -  Physician Assistants and Nurse Practitioners) who all work together to provide you with the care you need, when you need it.  Special Instructions:  . Stay safe, wash your hands for at least 20 seconds and wear a mask when needed.  . It was good to talk with you today.    Call the [facility] office at [phone] if you have any questions, problems or concerns.

## 2019-07-05 LAB — BASIC METABOLIC PANEL
BUN/Creatinine Ratio: 11 (ref 9–23)
BUN: 9 mg/dL (ref 6–24)
CO2: 21 mmol/L (ref 20–29)
Calcium: 9.5 mg/dL (ref 8.7–10.2)
Chloride: 99 mmol/L (ref 96–106)
Creatinine, Ser: 0.85 mg/dL (ref 0.57–1.00)
GFR calc Af Amer: 94 mL/min/{1.73_m2} (ref >59)
GFR calc non Af Amer: 82 mL/min/{1.73_m2} (ref >59)
Glucose: 102 mg/dL — ABNORMAL HIGH (ref 65–99)
Potassium: 4.4 mmol/L (ref 3.5–5.2)
Sodium: 136 mmol/L (ref 134–144)

## 2019-07-05 NOTE — Progress Notes (Deleted)
CARDIOLOGY OFFICE NOTE  Date:  07/18/2019    Nicole Hughes Date of Birth: 1970/04/06 Medical Record #[id number]  PCP:  Nicole Pimple, MD  Cardiologist:  Nicole Hughes   No chief complaint on file.   History of Present Illness: Nicole Hughes is a 49 y.o. female who presents today for a follow up visit. Seen for Dr. Katrinka Hughes.  Prior history consistent with post partum cardiomyopathy/heart failure as well as PE in 2000. No recurrence of cardiac dysfunction since that time.   Seen here in June of 2019 by Dr. Katrinka Hughes after referral from PCP for atypical chest pain. Echo and GXT were obtained. She was treated with a short course of Colchicine.  I saw her back in October of 2020 -  needing hysterectomy for vaginal issues with bleeding/pain. Had just had foot surgery. She was felt to be doing well.    Then saw a few weeks ago - needed clearance again - for hysterectomy that she had not had in 2020 - had had 2 ER visits for chest pain - felt to be GI - stopped alcohol and had EGD - noted ulcer and esophagitis - had lost 20 pounds - could not eat. Her esophagus was dilated. Was now feeling better and no more chest pain. BP was quite elevated however. She was anxious.   Comes in today. Here with   Past Medical History:  Diagnosis Date  . Anxiety   . Cardiomyopathy (HCC)   . Chronic lower back pain   . Complication of anesthesia    "hard to get under"  . Cyst of ovary   . Depression    hx  . DVT complicating pregnancy 08/1998   RLE; RUE  . Dysmenorrhea   . Dyspareunia   . 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 GERD (gastroesophageal reflux disease)   . Hay fever    "fall and spring" (02/17/2017)  . Headache    "2-3 times/.wik" (02/17/2017)  . History of chicken pox   . Hypertension   . Migraine    "monthly" (02/17/2017)  . Pelvic pain   . Pulmonary embolism (HCC) 08/1998   S/P childbirth    Past Surgical History:  Procedure Laterality Date  .  CESAREAN SECTION  1994  . ECTOPIC PREGNANCY SURGERY  2010  . LAPAROSCOPY N/A 09/24/2012   Procedure: OPERATIVE LAPROSCOPY WITH LYSIS OF ADHESIONS;  Surgeon: Sherron Monday, MD;  Location: [facility];  Service: Gynecology;  Laterality: N/A;  . TONSILLECTOMY  1984  . TUBAL LIGATION  2010     Medications: No outpatient medications have been marked as taking for the 07/19/19 encounter (Appointment) with Rosalio Macadamia, NP.     Allergies: No Known Allergies  Social History: The patient  reports that she has never smoked. She has never used smokeless tobacco. She reports previous alcohol use of about 5.0 standard drinks of alcohol per week. She reports that she does not use drugs.   Family History: The patient's family history includes Cancer - Lung in her mother; Heart failure in her father; Hypertension in her father.   Review of Systems: Please see the history of present illness.   All other systems are reviewed and negative.   Physical Exam: VS:  There were no vitals taken for this visit. Marland Kitchen  BMI There is no height or weight on file to calculate BMI.  Wt Readings from Last 3 Encounters:  07/04/19 157 lb 6.4 oz (71.4 kg)  06/21/19  152 lb (68.9 kg)  06/16/19 152 lb (68.9 kg)    General: Pleasant. Well developed, well nourished and in no acute distress.   HEENT: Normal.  Neck: Supple, no JVD, carotid bruits, or masses noted.  Cardiac: ***Regular rate and rhythm. No murmurs, rubs, or gallops. No edema.  Respiratory:  Lungs are clear to auscultation bilaterally with normal work of breathing.  GI: Soft and nontender.  MS: No deformity or atrophy. Gait and ROM intact.  Skin: Warm and dry. Color is normal.  Neuro:  Strength and sensation are intact and no gross focal deficits noted.  Psych: Alert, appropriate and with normal affect.   LABORATORY DATA:  EKG:  EKG {ACTION; IS/IS [id number]} ordered today.  Personally reviewed by me. This demonstrates ***.  Lab Results  Component  Value Date   WBC 7.4 06/20/2019   HGB 10.3 (L) 06/20/2019   HCT 32.1 (L) 06/20/2019   PLT 282.0 06/20/2019   GLUCOSE 102 (H) 07/04/2019   CHOL 199 04/09/2015   TRIG 95.0 04/09/2015   HDL 68.90 04/09/2015   LDLCALC 111 (H) 04/09/2015   ALT 34 05/29/2019   AST 35 05/29/2019   NA 136 07/04/2019   K 4.4 07/04/2019   CL 99 07/04/2019   CREATININE 0.85 07/04/2019   BUN 9 07/04/2019   CO2 21 07/04/2019   TSH 0.685 06/22/2017   INR 1.9 (H) 05/29/2019     BNP (last 3 results) No results for input(s): BNP in the last 8760 hours.  ProBNP (last 3 results) No results for input(s): PROBNP in the last 8760 hours.   Other Studies Reviewed Today:   EchoStudy Conclusions6/2019  - Left ventricle: The cavity size was normal. Wall thickness was normal. Systolic function was normal. The estimated ejection fraction was in the range of 60% to 65%. Wall motion was normal; there were no regional wall motion abnormalities. Features are consistent with a pseudonormal left ventricular filling pattern, with concomitant abnormal relaxation and increased filling pressure (grade 2 diastolic dysfunction).   GXTStudy Highlights6/2019   Blood pressure demonstrated a normal response to exercise.  There was no ST segment deviation noted during stress.  No T wave inversion was noted during stress.  Negative, adequate stress test.     Assessment/Plan: 1.Pre op clearance for hysterectomy - her prior chest pain was felt to be GI/esophagus - this is resolved. BP is grossly elevated and needs to be treated prior to having surgery.   2. Chest pain - resolved - this was felt to be Gi - she has been treated for an ulcer and esophagitis. She has stopped alcohol and NSAID use.   3. Prior history of PE and post partum cardiomyopathy - last echo with normal EF.   4. Anxiety  5. HTN - BP is not at goal - increasing the Toprol to 100 mg a day and adding low dose HCTZ 12.5 mg  a day. BMET today - will plan to check BMET on return.     Current medicines are reviewed with the patient today.  The patient does not have concerns regarding medicines other than what has been noted above.  The following changes have been made:  See above.  Labs/ tests ordered today include:   No orders of the defined types were placed in this encounter.    Disposition:   FU with *** in {gen number 8-17:711657} {Days to years:10300}.   Patient is agreeable to this plan and will call if any problems develop in the interim.  SignedNorma Fredrickson, NP  07/18/2019 8:56 AM  [facility] [address] [place], [place]  [address] Phone: [phone] Fax: [phone]

## 2019-07-07 ENCOUNTER — Ambulatory Visit: Payer: 59 | Admitting: Nurse Practitioner

## 2019-07-19 ENCOUNTER — Ambulatory Visit: Payer: 59 | Admitting: Nurse Practitioner

## 2019-08-24 ENCOUNTER — Other Ambulatory Visit: Payer: Self-pay | Admitting: Family Medicine

## 2019-08-25 ENCOUNTER — Telehealth: Payer: Self-pay

## 2019-08-25 NOTE — Telephone Encounter (Signed)
Called the patient. Got her voicemail. Left a message to come by for the follow up lab work.

## 2019-08-25 NOTE — Telephone Encounter (Signed)
-----   Message from Chrystie Nose, RN sent at 06/23/2019  1:40 PM EDT ----- Regarding: cbc Pt needs CBC, order in epic

## 2019-08-26 ENCOUNTER — Encounter (HOSPITAL_COMMUNITY): Payer: Self-pay | Admitting: Emergency Medicine

## 2019-08-26 ENCOUNTER — Emergency Department (HOSPITAL_COMMUNITY)
Admission: EM | Admit: 2019-08-26 | Discharge: 2019-08-26 | Disposition: A | Discharge status: Home / Self Care | Payer: 59 | Attending: Emergency Medicine | Admitting: Emergency Medicine

## 2019-08-26 DIAGNOSIS — Z79899 Other long term (current) drug therapy: Secondary | ICD-10-CM | POA: Insufficient documentation

## 2019-08-26 DIAGNOSIS — I10 Essential (primary) hypertension: Secondary | ICD-10-CM | POA: Diagnosis not present

## 2019-08-26 DIAGNOSIS — R110 Nausea: Secondary | ICD-10-CM | POA: Insufficient documentation

## 2019-08-26 DIAGNOSIS — R079 Chest pain, unspecified: Secondary | ICD-10-CM | POA: Insufficient documentation

## 2019-08-26 DIAGNOSIS — N938 Other specified abnormal uterine and vaginal bleeding: Secondary | ICD-10-CM | POA: Diagnosis not present

## 2019-08-26 LAB — COMPREHENSIVE METABOLIC PANEL
ALT: 30 U/L (ref 0–44)
AST: 37 U/L (ref 15–41)
Albumin: 4.9 g/dL (ref 3.5–5.0)
Alkaline Phosphatase: 30 U/L — ABNORMAL LOW (ref 38–126)
Anion gap: 12 (ref 5–15)
BUN: 12 mg/dL (ref 6–20)
CO2: 20 mmol/L — ABNORMAL LOW (ref 22–32)
Calcium: 9.4 mg/dL (ref 8.9–10.3)
Chloride: 103 mmol/L (ref 98–111)
Creatinine, Ser: 0.71 mg/dL (ref 0.44–1.00)
GFR calc Af Amer: >60 mL/min (ref >60)
GFR calc non Af Amer: >60 mL/min (ref >60)
Glucose, Bld: 90 mg/dL (ref 70–99)
Potassium: 3.6 mmol/L (ref 3.5–5.1)
Sodium: 135 mmol/L (ref 135–145)
Total Bilirubin: 0.7 mg/dL (ref 0.3–1.2)
Total Protein: 8.4 g/dL — ABNORMAL HIGH (ref 6.5–8.1)

## 2019-08-26 LAB — URINALYSIS, ROUTINE W REFLEX MICROSCOPIC
Bilirubin Urine: NEGATIVE
Glucose, UA: NEGATIVE mg/dL
Hgb urine dipstick: NEGATIVE
Ketones, ur: NEGATIVE mg/dL
Leukocytes,Ua: NEGATIVE
Nitrite: NEGATIVE
Protein, ur: NEGATIVE mg/dL
Specific Gravity, Urine: 1.009 (ref 1.005–1.030)
pH: 6 (ref 5.0–8.0)

## 2019-08-26 LAB — CBC WITH DIFFERENTIAL/PLATELET
Abs Immature Granulocytes: 0.01 10*3/uL (ref 0.00–0.07)
Basophils Absolute: 0.1 10*3/uL (ref 0.0–0.1)
Basophils Relative: 1 %
Eosinophils Absolute: 0 10*3/uL (ref 0.0–0.5)
Eosinophils Relative: 1 %
HCT: 32.5 % — ABNORMAL LOW (ref 36.0–46.0)
Hemoglobin: 9.9 g/dL — ABNORMAL LOW (ref 12.0–15.0)
Immature Granulocytes: 0 %
Lymphocytes Relative: 30 %
Lymphs Abs: 2.2 10*3/uL (ref 0.7–4.0)
MCH: 23 pg — ABNORMAL LOW (ref 26.0–34.0)
MCHC: 30.5 g/dL (ref 30.0–36.0)
MCV: 75.6 fL — ABNORMAL LOW (ref 80.0–100.0)
Monocytes Absolute: 0.7 10*3/uL (ref 0.1–1.0)
Monocytes Relative: 10 %
Neutro Abs: 4.5 10*3/uL (ref 1.7–7.7)
Neutrophils Relative %: 58 %
Platelets: 281 10*3/uL (ref 150–400)
RBC: 4.3 MIL/uL (ref 3.87–5.11)
RDW: 17.6 % — ABNORMAL HIGH (ref 11.5–15.5)
WBC: 7.6 10*3/uL (ref 4.0–10.5)
nRBC: 0 % (ref 0.0–0.2)

## 2019-08-26 LAB — I-STAT BETA HCG BLOOD, ED (MC, WL, AP ONLY): I-stat hCG, quantitative: <5 m[IU]/mL (ref <5)

## 2019-08-26 LAB — LIPASE, BLOOD: Lipase: 59 U/L — ABNORMAL HIGH (ref 11–51)

## 2019-08-26 MED ORDER — SODIUM CHLORIDE 0.9 % IV BOLUS
1000.0000 mL | Freq: Once | INTRAVENOUS | Status: AC
  Administered 2019-08-26: 1000 mL via INTRAVENOUS

## 2019-08-26 MED ORDER — PROMETHAZINE HCL 25 MG/ML IJ SOLN
12.5000 mg | Freq: Once | INTRAMUSCULAR | Status: AC
  Administered 2019-08-26: 12.5 mg via INTRAVENOUS
  Filled 2019-08-26: qty 1

## 2019-08-26 MED ORDER — HYDROMORPHONE HCL 1 MG/ML IJ SOLN
1.0000 mg | Freq: Once | INTRAMUSCULAR | Status: AC
  Administered 2019-08-26: 1 mg via INTRAVENOUS
  Filled 2019-08-26: qty 1

## 2019-08-26 MED ORDER — KETOROLAC TROMETHAMINE 30 MG/ML IJ SOLN
15.0000 mg | Freq: Once | INTRAMUSCULAR | Status: AC
  Administered 2019-08-26: 15 mg via INTRAVENOUS
  Filled 2019-08-26: qty 1

## 2019-08-26 MED ORDER — ONDANSETRON HCL 4 MG/2ML IJ SOLN
4.0000 mg | Freq: Once | INTRAMUSCULAR | Status: AC
  Administered 2019-08-26: 4 mg via INTRAVENOUS
  Filled 2019-08-26: qty 2

## 2019-08-26 MED ORDER — ETODOLAC 300 MG PO CAPS: 300.0000 mg | ORAL_CAPSULE | Freq: Three times a day (TID) | ORAL | 0 refills | Status: DC

## 2019-08-26 NOTE — Discharge Instructions (Signed)
Follow up with your OB doctor.  Take the medications as needed for pain instead of the ibuprofen

## 2019-08-26 NOTE — ED Triage Notes (Addendum)
Per pt, states she has been bleeding since 7/14-saw GYN last week and they put her "adjustin" which has slowed down her bleeding some-states she is nauseated and having pelvic pain-states she cant get an ultrasound till september

## 2019-08-26 NOTE — ED Provider Notes (Signed)
[facility]-EMERGENCY DEPT Provider Note   CSN: [id number] Arrival date & time: 08/26/19  1314     History Chief Complaint  Patient presents with   Vaginal Bleeding    Nicole Hughes is a 49 y.o. female.  HPI   Pt had been having vaginal bleeding since July 14.  Patient also has been having cramping pelvic pain.  Patient states she has a history of endometriosis.  She did see her OB/GYN doctor for this and was started on a medication. She started the medication on the 9th.  Her bleeding persisted and thi Tuesday it was increased to twice daily.  Pt also had a pelvic exam at that time at her OB's office. The bleeding has decreased since then but she continues to have pain and cramping.  Pt is scheduled for an ultrasound but it is not until September.  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   GERD (gastroesophageal reflux disease)    Hay fever    "fall and spring" (02/17/2017)   Headache    "2-3 times/.wik" (02/17/2017)   History of chicken pox    Hypertension    Migraine    "monthly" (02/17/2017)   Pelvic pain    Pulmonary embolism (HCC) 08/1998   S/P childbirth    Patient Active Problem List   Diagnosis Date Noted   Dysphagia 05/31/2019   Insect bite 06/21/2018   Pedal edema 03/31/2018   Anemia 03/31/2018   Appetite loss 03/08/2018   Myofascial pain 03/08/2018   IBS (irritable bowel syndrome) 03/08/2018   Alcoholic hepatitis without ascites 03/05/2018   Elevated LFTs 03/03/2018   Achilles tendonitis 01/28/2018   Abnormal cervical Papanicolaou smear 09/29/2017   Insomnia 09/29/2017   Pain 09/29/2017   Palpitations 05/25/2017    Chest wall pain 05/25/2017   Elevated transaminase level 05/25/2017   Depression with anxiety 02/23/2017   MRSA carrier 02/23/2017   Hyponatremia 02/17/2017   Hypokalemia 09/24/2016   Alcohol abuse 09/22/2016   H/O acute pancreatitis 09/22/2016   GERD (gastroesophageal reflux disease) 04/09/2015   Epigastric pain 01/17/2014   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/24/2012   Chest pain 08/20/2012   Stress reaction 07/23/2012   Hypertension 06/09/2012   History of pulmonary embolism 06/09/2012   Preop cardiovascular exam 06/08/2012   Dysmenorrhea    Dyspareunia    Pelvic pain     Past Surgical History:  Procedure Laterality Date   CESAREAN SECTION  1994   ECTOPIC PREGNANCY SURGERY  2010   LAPAROSCOPY N/A 09/24/2012   Procedure: OPERATIVE LAPROSCOPY WITH LYSIS OF ADHESIONS;  Surgeon: Sherron MondayJody [name], MD;  Location: [facility];  Service: Gynecology;  Laterality: N/A;   TONSILLECTOMY  1984   TUBAL LIGATION  2010     OB History   No obstetric history on file.     Family History  Problem Relation Age of Onset   Cancer - Lung Mother    Heart failure Father    Hypertension Father     Social History   Tobacco Use   Smoking status: Never Smoker   Smokeless tobacco: Never Used  Advertising account plannerVaping Use  Vaping Use: Never used  Substance Use Topics   Alcohol use: Not Currently    Alcohol/week: 5.0 standard drinks    Types: 5 Cans of beer per week    Comment: has quit May 07, 2019   Drug use: No    Home Medications Prior to Admission medications   Medication Sig Start Date End Date Taking? Authorizing Provider  amLODipine (NORVASC) 10 MG tablet Take 1 tablet by mouth once daily 06/07/19  Yes Tower, Idamae Schuller A, MD  hydrochlorothiazide (MICROZIDE) 12.5 MG capsule Take 1 capsule (12.5 mg total) by mouth daily. 07/04/19 10/02/19 Yes Rosalio Macadamia, NP  hydrOXYzine (ATARAX/VISTARIL) 25 MG tablet TAKE 1 TABLET BY MOUTH EVERY 8 HOURS AS NEEDED FOR ANXIETY OR  NAUSEA.   CAUTION  OF  SEDATION Patient taking differently: Take 25 mg by mouth every 8 (eight) hours as needed for nausea or vomiting.  06/08/19  Yes Tower, Audrie Gallus, MD  lisinopril (ZESTRIL) 10 MG tablet Take 2 tablets by mouth once daily 08/24/19  Yes Tower, Audrie Gallus, MD  metoprolol succinate (TOPROL-XL) 100 MG 24 hr tablet Take 1 tablet (100 mg total) by mouth daily. Take with or immediately following a meal. 07/04/19 10/02/19 Yes Gerhardt, Jennet Maduro, NP  mirtazapine (REMERON) 15 MG tablet TAKE 1 TABLET BY MOUTH AT BEDTIME Patient taking differently: Take 15 mg by mouth at bedtime as needed (sleep).  05/02/19  Yes Tower, Audrie Gallus, MD  norethindrone (AYGESTIN) 5 MG tablet Take 5 mg by mouth daily. 08/08/19  Yes [provider]  omeprazole (PRILOSEC) 20 MG capsule Take 20 mg by mouth 2 (two) times daily before a meal.   Yes [provider]  sertraline (ZOLOFT) 50 MG tablet Take 100 mg by mouth daily.   Yes [provider]  sucralfate (CARAFATE) 1 GM/10ML suspension Take 10 mLs (1 g total) by mouth 4 (four) times daily. 06/21/19  Yes Armbruster, Willaim Rayas, MD  etodolac (LODINE) 300 MG capsule Take 1 capsule (300 mg total) by mouth 3 (three) times daily. 08/26/19   Linwood Dibbles, MD  naltrexone (DEPADE) 50 MG tablet Take 1 tablet (50 mg total) by mouth daily. Patient not taking: Reported on 08/26/2019 04/29/19   Judy Pimple, MD    Allergies    Patient has no known allergies.  Review of Systems   Review of Systems  Constitutional: Negative for fever.  Cardiovascular: Positive for chest pain.  Gastrointestinal: Positive for nausea.  All other systems reviewed and are negative.   Physical Exam Updated Vital Signs BP (!) 165/88    Pulse 90    Temp 99.8 F (37.7 C) (Oral)    Resp 19    SpO2 99%   Physical Exam Vitals and nursing note reviewed.  Constitutional:      Appearance: She is well-developed.     Comments: Appears to be in pain  HENT:     Head: Normocephalic and atraumatic.      Right Ear: External ear normal.     Left Ear: External ear normal.  Eyes:     General: No scleral icterus.       Right eye: No discharge.        Left eye: No discharge.     Conjunctiva/sclera: Conjunctivae normal.  Neck:     Trachea: No tracheal deviation.  Cardiovascular:     Rate and Rhythm: Normal rate and regular rhythm.  Pulmonary:     Effort: Pulmonary effort is normal. No respiratory distress.  Breath sounds: Normal breath sounds. No stridor. No wheezing or rales.  Abdominal:     General: Bowel sounds are normal. There is no distension.     Palpations: Abdomen is soft.     Tenderness: There is abdominal tenderness. There is no guarding or rebound.     Comments: suprapubic  Musculoskeletal:        General: No tenderness.     Cervical back: Neck supple.  Skin:    General: Skin is warm and dry.     Findings: No rash.  Neurological:     Mental Status: She is alert.     Cranial Nerves: No cranial nerve deficit (no facial droop, extraocular movements intact, no slurred speech).     Sensory: No sensory deficit.     Motor: No abnormal muscle tone or seizure activity.     Coordination: Coordination normal.     ED Results / Procedures / Treatments   Labs (all labs ordered are listed, but only abnormal results are displayed) Labs Reviewed  CBC WITH DIFFERENTIAL/PLATELET - Abnormal; Notable for the following components:      Result Value   Hemoglobin 9.9 (*)    HCT 32.5 (*)    MCV 75.6 (*)    MCH 23.0 (*)    RDW 17.6 (*)    All other components within normal limits  URINALYSIS, ROUTINE W REFLEX MICROSCOPIC - Abnormal; Notable for the following components:   Color, Urine STRAW (*)    All other components within normal limits  COMPREHENSIVE METABOLIC PANEL - Abnormal; Notable for the following components:   CO2 20 (*)    Total Protein 8.4 (*)    Alkaline Phosphatase 30 (*)    All other components within normal limits  LIPASE, BLOOD - Abnormal; Notable for the  following components:   Lipase 59 (*)    All other components within normal limits  I-STAT BETA HCG BLOOD, ED ([facility], [facility], [facility] ONLY)    EKG EKG Interpretation  Date/Time:  Friday August 26 2019 15:48:23 EDT Ventricular Rate:  106 PR Interval:    QRS Duration: 74 QT Interval:  343 QTC Calculation: 456 R Axis:   29 Text Interpretation: Sinus tachycardia 12 Lead; Mason-Likar No significant change since last tracing Confirmed by Linwood Dibbles [phone]) on 08/26/2019 8:38:46 PM   Radiology No results found.  Procedures Procedures (including critical care time)  Medications Ordered in ED Medications  ondansetron (ZOFRAN) injection 4 mg (4 mg Intravenous Given 08/26/19 2059)  ketorolac (TORADOL) 30 MG/ML injection 15 mg (15 mg Intravenous Given 08/26/19 2100)  sodium chloride 0.9 % bolus 1,000 mL (1,000 mLs Intravenous New Bag/Given 08/26/19 2059)  HYDROmorphone (DILAUDID) injection 1 mg (1 mg Intravenous Given 08/26/19 2235)  promethazine (PHENERGAN) injection 12.5 mg (12.5 mg Intravenous Given 08/26/19 2237)    ED Course  I have reviewed the triage vital signs and the nursing notes.  Pertinent labs & imaging results that were available during my care of the patient were reviewed by me and considered in my medical decision making (see chart for details).  Clinical Course as of Aug 25 2317  Caleen Essex Aug 26, 2019  2208 Hemoglobin is 9.9.  Similar to previous values.   [JK]  2208 Lipase slightly elevated at 59   [JK]  2208 Metabolic panel without significant abnormalities.   [JK]  2208 Urinalysis without signs of infection.  Pregnancy test is negative.   [JK]  2231 Patient still having pain.  Requests additional medications.   [JK]  2318 Patient is feeling better now.  Symptoms have improved with treatment   [JK]    Clinical Course User Index [JK] Linwood Dibbles, MD   MDM Rules/Calculators/A&P                           Pt presented to the ED with abdominal pain, persistent vaginal bleeding.   Hx of endometriosis.  Saw her OB 2 days ago for the same sx.  Plan is for outpt ultrasound, possible hysterectomy per patient.  No signs of acute infection.   Doubt appendicitis, renal colic, acute infection. Labs reassuring.  HGB stable.  Pt;s pain treated in the ED.  Follow up with ob Final Clinical Impression(s) / ED Diagnoses Final diagnoses:  Dysfunctional uterine bleeding    Rx / DC Orders ED Discharge Orders         Ordered    etodolac (LODINE) 300 MG capsule  3 times daily       Note to Pharmacy: As needed for pain   08/26/19 2248           Linwood Dibbles, MD 08/26/19 2319

## 2019-08-26 NOTE — ED Notes (Signed)
Patient reporting chest pain. EKG ordered at this time.

## 2019-09-19 NOTE — Progress Notes (Deleted)
CARDIOLOGY OFFICE NOTE  Date:  09/19/2019    Nicole Hughes Date of Birth: [birth year] Medical Record #[id number]  PCP:  Judy Pimple, MD  Cardiologist:  Tyrone Sage & ***    No chief complaint on file.   History of Present Illness: Nicole Hughes is a 49 y.o. female who presents today for a ***  Seen for Dr. Katrinka Blazing.  Prior history consistent with post partum cardiomyopathy/heart failure as well as PE in 2000. No recurrence of cardiac dysfunction since that time.   Seen here in June of 2019 by Dr. Katrinka Blazing after referral from PCP for atypical chest pain. Echo and GXT were obtained. She was treated with a short course of Colchicine.  I saw her back in October of 2020 -  needing hysterectomy for vaginal issues with bleeding/pain. Had just had foot surgery. She was felt to be doing well.    The patient does not have symptoms concerning for COVID-19 infection (fever, chills, cough, or new shortness of breath).   Comes in today. Here alone. Now needing clearance again - for hysterectomy. This visit was scheduled due to recent visits for chest pain. She has had 2 ER visits and a visit to her PCP - looks to have been sent to GI. Noted to have stopped alcohol.  She has had EGD - noted ulcer and esophagitis - had lost 20 pounds - could not eat. Her esophagus was dilated. Now feels better. Chest pain has improved - basically resolved now. She walks most days despite her prior foot surgery. Not drinking alcohol. She admits she is anxious. Now ready to have her hysterectomy. Apparently with lots of pain issues - endometriosis - this has been felt to be driving some of her elevated BP. She does admit to salt.    Comes in today. Here with   Past Medical History:  Diagnosis Date  . Anxiety   . Cardiomyopathy (HCC)   . Chronic lower back pain   . Complication of anesthesia    "hard to get under"  . Cyst of ovary   . Depression    hx  . DVT complicating pregnancy 08/1998    RLE; RUE  . Dysmenorrhea   . Dyspareunia   . 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 GERD (gastroesophageal reflux disease)   . Hay fever    "fall and spring" (02/17/2017)  . Headache    "2-3 times/.wik" (02/17/2017)  . History of chicken pox   . Hypertension   . Migraine    "monthly" (02/17/2017)  . Pelvic pain   . Pulmonary embolism (HCC) 08/1998   S/P childbirth    Past Surgical History:  Procedure Laterality Date  . CESAREAN SECTION  1994  . ECTOPIC PREGNANCY SURGERY  2010  . LAPAROSCOPY N/A 09/24/2012   Procedure: OPERATIVE LAPROSCOPY WITH LYSIS OF ADHESIONS;  Surgeon: Sherron Monday, MD;  Location: [facility];  Service: Gynecology;  Laterality: N/A;  . TONSILLECTOMY  1984  . TUBAL LIGATION  2010     Medications: No outpatient medications have been marked as taking for the 09/28/19 encounter (Appointment) with Rosalio Macadamia, NP.     Allergies: No Known Allergies  Social History: The patient  reports that she has never smoked. She has never used smokeless tobacco. She reports previous alcohol use of about 5.0 standard drinks of alcohol per week. She reports that she does not use drugs.   Family History: The patient's ***family history includes Cancer - Lung in  her mother; Heart failure in her father; Hypertension in her father.   Review of Systems: Please see the history of present illness.   All other systems are reviewed and negative.   Physical Exam: VS:  There were no vitals taken for this visit. Marland Kitchen  BMI There is no height or weight on file to calculate BMI.  Wt Readings from Last 3 Encounters:  07/04/19 157 lb 6.4 oz (71.4 kg)  06/21/19 152 lb (68.9 kg)  06/16/19 152 lb (68.9 kg)    General: Pleasant. Well developed, well nourished and in no acute distress.   HEENT: Normal.  Neck: Supple, no JVD, carotid bruits, or masses noted.  Cardiac: ***Regular rate and rhythm. No murmurs, rubs, or gallops. No edema.  Respiratory:  Lungs are clear to auscultation  bilaterally with normal work of breathing.  GI: Soft and nontender.  MS: No deformity or atrophy. Gait and ROM intact.  Skin: Warm and dry. Color is normal.  Neuro:  Strength and sensation are intact and no gross focal deficits noted.  Psych: Alert, appropriate and with normal affect.   LABORATORY DATA:  EKG:  EKG {ACTION; IS/IS [id number]} ordered today.  Personally reviewed by me. This demonstrates ***.  Lab Results  Component Value Date   WBC 7.6 08/26/2019   HGB 9.9 (L) 08/26/2019   HCT 32.5 (L) 08/26/2019   PLT 281 08/26/2019   GLUCOSE 90 08/26/2019   CHOL 199 04/09/2015   TRIG 95.0 04/09/2015   HDL 68.90 04/09/2015   LDLCALC 111 (H) 04/09/2015   ALT 30 08/26/2019   AST 37 08/26/2019   NA 135 08/26/2019   K 3.6 08/26/2019   CL 103 08/26/2019   CREATININE 0.71 08/26/2019   BUN 12 08/26/2019   CO2 20 (L) 08/26/2019   TSH 0.685 06/22/2017   INR 1.9 (H) 05/29/2019     BNP (last 3 results) No results for input(s): BNP in the last 8760 hours.  ProBNP (last 3 results) No results for input(s): PROBNP in the last 8760 hours.   Other Studies Reviewed Today:   Assessment/Plan:  EchoStudy Conclusions6/2019  - Left ventricle: The cavity size was normal. Wall thickness was normal. Systolic function was normal. The estimated ejection fraction was in the range of 60% to 65%. Wall motion was normal; there were no regional wall motion abnormalities. Features are consistent with a pseudonormal left ventricular filling pattern, with concomitant abnormal relaxation and increased filling pressure (grade 2 diastolic dysfunction).   GXTStudy Highlights6/2019   Blood pressure demonstrated a normal response to exercise.  There was no ST segment deviation noted during stress.  No T wave inversion was noted during stress.  Negative, adequate stress test.     Assessment/Plan: 1.Pre op clearance for hysterectomy - her prior chest pain was felt  to be GI/esophagus - this is resolved. BP is grossly elevated and needs to be treated prior to having surgery.   2. Chest pain - resolved - this was felt to be Gi - she has been treated for an ulcer and esophagitis. She has stopped alcohol and NSAID use.   3. Prior history of PE and post partum cardiomyopathy - last echo with normal EF.   4. Anxiety  5. HTN - BP is not at goal - increasing the Toprol to 100 mg a day and adding low dose HCTZ 12.5 mg a day. BMET today - will plan to check BMET on return.       Current medicines are reviewed with the  patient today.  The patient does not have concerns regarding medicines other than what has been noted above.  The following changes have been made:  See above.  Labs/ tests ordered today include:   No orders of the defined types were placed in this encounter.    Disposition:   FU with *** in {gen number 3-14:970263} {Days to years:10300}.   Patient is agreeable to this plan and will call if any problems develop in the interim.   SignedNorma Fredrickson, NP  09/19/2019 8:41 AM  [facility] [address] [place], [place]  [address] Phone: [phone] Fax: [phone]

## 2019-09-28 ENCOUNTER — Ambulatory Visit: Payer: 59 | Admitting: Nurse Practitioner

## 2019-10-02 ENCOUNTER — Other Ambulatory Visit: Payer: Self-pay | Admitting: Nurse Practitioner

## 2019-10-19 ENCOUNTER — Emergency Department (HOSPITAL_COMMUNITY): Payer: 59

## 2019-10-19 ENCOUNTER — Other Ambulatory Visit: Payer: Self-pay

## 2019-10-19 ENCOUNTER — Emergency Department (HOSPITAL_COMMUNITY)
Admission: EM | Admit: 2019-10-19 | Discharge: 2019-10-20 | Disposition: A | Discharge status: Home / Self Care | Payer: 59 | Attending: Emergency Medicine | Admitting: Emergency Medicine

## 2019-10-19 ENCOUNTER — Encounter (HOSPITAL_COMMUNITY): Payer: Self-pay

## 2019-10-19 DIAGNOSIS — R079 Chest pain, unspecified: Secondary | ICD-10-CM | POA: Diagnosis present

## 2019-10-19 DIAGNOSIS — K219 Gastro-esophageal reflux disease without esophagitis: Secondary | ICD-10-CM | POA: Insufficient documentation

## 2019-10-19 DIAGNOSIS — I10 Essential (primary) hypertension: Secondary | ICD-10-CM | POA: Insufficient documentation

## 2019-10-19 DIAGNOSIS — R0602 Shortness of breath: Secondary | ICD-10-CM | POA: Diagnosis not present

## 2019-10-19 DIAGNOSIS — K644 Residual hemorrhoidal skin tags: Secondary | ICD-10-CM | POA: Insufficient documentation

## 2019-10-19 DIAGNOSIS — R519 Headache, unspecified: Secondary | ICD-10-CM | POA: Insufficient documentation

## 2019-10-19 DIAGNOSIS — D649 Anemia, unspecified: Secondary | ICD-10-CM | POA: Diagnosis not present

## 2019-10-19 DIAGNOSIS — Z20822 Contact with and (suspected) exposure to covid-19: Secondary | ICD-10-CM | POA: Diagnosis not present

## 2019-10-19 DIAGNOSIS — Z79899 Other long term (current) drug therapy: Secondary | ICD-10-CM | POA: Diagnosis not present

## 2019-10-19 DIAGNOSIS — R112 Nausea with vomiting, unspecified: Secondary | ICD-10-CM | POA: Insufficient documentation

## 2019-10-19 DIAGNOSIS — M62838 Other muscle spasm: Secondary | ICD-10-CM

## 2019-10-19 DIAGNOSIS — R0789 Other chest pain: Secondary | ICD-10-CM | POA: Insufficient documentation

## 2019-10-19 DIAGNOSIS — R197 Diarrhea, unspecified: Secondary | ICD-10-CM | POA: Insufficient documentation

## 2019-10-19 DIAGNOSIS — Z789 Other specified health status: Secondary | ICD-10-CM

## 2019-10-19 DIAGNOSIS — F1020 Alcohol dependence, uncomplicated: Secondary | ICD-10-CM

## 2019-10-19 DIAGNOSIS — R10816 Epigastric abdominal tenderness: Secondary | ICD-10-CM | POA: Insufficient documentation

## 2019-10-19 LAB — COMPREHENSIVE METABOLIC PANEL
ALT: 33 U/L (ref 0–44)
AST: 44 U/L — ABNORMAL HIGH (ref 15–41)
Albumin: 3.8 g/dL (ref 3.5–5.0)
Alkaline Phosphatase: 36 U/L — ABNORMAL LOW (ref 38–126)
Anion gap: 12 (ref 5–15)
BUN: 8 mg/dL (ref 6–20)
CO2: 22 mmol/L (ref 22–32)
Calcium: 9.5 mg/dL (ref 8.9–10.3)
Chloride: 100 mmol/L (ref 98–111)
Creatinine, Ser: 0.68 mg/dL (ref 0.44–1.00)
GFR, Estimated: >60 mL/min (ref >60)
Glucose, Bld: 131 mg/dL — ABNORMAL HIGH (ref 70–99)
Potassium: 3.2 mmol/L — ABNORMAL LOW (ref 3.5–5.1)
Sodium: 134 mmol/L — ABNORMAL LOW (ref 135–145)
Total Bilirubin: 0.3 mg/dL (ref 0.3–1.2)
Total Protein: 6.7 g/dL (ref 6.5–8.1)

## 2019-10-19 LAB — URINALYSIS, COMPLETE (UACMP) WITH MICROSCOPIC
Bilirubin Urine: NEGATIVE
Glucose, UA: NEGATIVE mg/dL
Hgb urine dipstick: NEGATIVE
Ketones, ur: NEGATIVE mg/dL
Leukocytes,Ua: NEGATIVE
Nitrite: NEGATIVE
Protein, ur: NEGATIVE mg/dL
Specific Gravity, Urine: 1.002 — ABNORMAL LOW (ref 1.005–1.030)
pH: 6 (ref 5.0–8.0)

## 2019-10-19 LAB — CBC WITH DIFFERENTIAL/PLATELET
Abs Immature Granulocytes: 0.01 10*3/uL (ref 0.00–0.07)
Basophils Absolute: 0.1 10*3/uL (ref 0.0–0.1)
Basophils Relative: 1 %
Eosinophils Absolute: 0.1 10*3/uL (ref 0.0–0.5)
Eosinophils Relative: 1 %
HCT: 30.2 % — ABNORMAL LOW (ref 36.0–46.0)
Hemoglobin: 8.8 g/dL — ABNORMAL LOW (ref 12.0–15.0)
Immature Granulocytes: 0 %
Lymphocytes Relative: 35 %
Lymphs Abs: 2 10*3/uL (ref 0.7–4.0)
MCH: 22.2 pg — ABNORMAL LOW (ref 26.0–34.0)
MCHC: 29.1 g/dL — ABNORMAL LOW (ref 30.0–36.0)
MCV: 76.3 fL — ABNORMAL LOW (ref 80.0–100.0)
Monocytes Absolute: 0.6 10*3/uL (ref 0.1–1.0)
Monocytes Relative: 11 %
Neutro Abs: 2.8 10*3/uL (ref 1.7–7.7)
Neutrophils Relative %: 52 %
Platelets: 266 10*3/uL (ref 150–400)
RBC: 3.96 MIL/uL (ref 3.87–5.11)
RDW: 17.7 % — ABNORMAL HIGH (ref 11.5–15.5)
WBC: 5.5 10*3/uL (ref 4.0–10.5)
nRBC: 0 % (ref 0.0–0.2)

## 2019-10-19 LAB — D-DIMER, QUANTITATIVE: D-Dimer, Quant: 0.3 ug/mL-FEU (ref 0.00–0.50)

## 2019-10-19 LAB — RESPIRATORY PANEL BY RT PCR (FLU A&B, COVID)
Influenza A by PCR: NEGATIVE
Influenza B by PCR: NEGATIVE
SARS Coronavirus 2 by RT PCR: NEGATIVE

## 2019-10-19 LAB — RAPID URINE DRUG SCREEN, HOSP PERFORMED
Amphetamines: NOT DETECTED
Barbiturates: NOT DETECTED
Benzodiazepines: NOT DETECTED
Cocaine: NOT DETECTED
Opiates: POSITIVE — AB
Tetrahydrocannabinol: POSITIVE — AB

## 2019-10-19 LAB — ETHANOL: Alcohol, Ethyl (B): 29 mg/dL — ABNORMAL HIGH (ref <10)

## 2019-10-19 LAB — LIPASE, BLOOD: Lipase: 88 U/L — ABNORMAL HIGH (ref 11–51)

## 2019-10-19 LAB — I-STAT BETA HCG BLOOD, ED (MC, WL, AP ONLY): I-stat hCG, quantitative: <5 m[IU]/mL (ref <5)

## 2019-10-19 LAB — TROPONIN I (HIGH SENSITIVITY): Troponin I (High Sensitivity): 6 ng/L (ref <18)

## 2019-10-19 MED ORDER — SODIUM CHLORIDE 0.9 % IV BOLUS
1000.0000 mL | Freq: Once | INTRAVENOUS | Status: AC
  Administered 2019-10-19: 1000 mL via INTRAVENOUS

## 2019-10-19 MED ORDER — ALUM & MAG HYDROXIDE-SIMETH 200-200-20 MG/5ML PO SUSP
30.0000 mL | Freq: Once | ORAL | Status: AC
  Administered 2019-10-20: 30 mL via ORAL
  Filled 2019-10-19: qty 30

## 2019-10-19 MED ORDER — LORAZEPAM 2 MG/ML IJ SOLN
1.0000 mg | Freq: Once | INTRAMUSCULAR | Status: AC
  Administered 2019-10-19: 1 mg via INTRAVENOUS
  Filled 2019-10-19: qty 1

## 2019-10-19 MED ORDER — ONDANSETRON HCL 4 MG/2ML IJ SOLN: 4.0000 mg | Freq: Once | INTRAMUSCULAR | Status: DC

## 2019-10-19 MED ORDER — LIDOCAINE VISCOUS HCL 2 % MT SOLN
15.0000 mL | Freq: Once | OROMUCOSAL | Status: AC
  Administered 2019-10-20: 15 mL via ORAL
  Filled 2019-10-19: qty 15

## 2019-10-19 MED ORDER — METOCLOPRAMIDE HCL 5 MG/ML IJ SOLN
5.0000 mg | Freq: Once | INTRAMUSCULAR | Status: AC
  Administered 2019-10-19: 5 mg via INTRAVENOUS
  Filled 2019-10-19: qty 2

## 2019-10-19 MED ORDER — PANTOPRAZOLE SODIUM 40 MG IV SOLR
40.0000 mg | Freq: Once | INTRAVENOUS | Status: AC
  Administered 2019-10-19: 40 mg via INTRAVENOUS
  Filled 2019-10-19: qty 40

## 2019-10-19 MED ORDER — MORPHINE SULFATE (PF) 4 MG/ML IV SOLN
4.0000 mg | Freq: Once | INTRAVENOUS | Status: AC
  Administered 2019-10-19: 4 mg via INTRAVENOUS
  Filled 2019-10-19: qty 1

## 2019-10-19 NOTE — ED Notes (Signed)
Patient transported to X-ray 

## 2019-10-19 NOTE — ED Triage Notes (Signed)
Pt w history of chronic alcohol abuse c/o cp, epigastric pain, sob and pain w inhalation, headache and diarrhea. S/s started today. Pt had 3 beers today, last drink 3 hours ago. Pt took 324asa at home and EMS gave 2 nitro sl. Pt states she has hx of cardiomyopathy, htn and PE's, denies anticoags

## 2019-10-19 NOTE — ED Provider Notes (Signed)
49 year old female received at sign out pending re-evaluation of symptoms and possible CT A/P. Per her HPI:   "Nicole Hughes is a 49 y.o. female with past medical history of alcohol abuse with recent relapse 2 months ago, PE s/p warfarin, postpartum cardiomyopathy in 2000, endometriosis, GERD, dysphagia and esophagitis status post EGD and dilation, hypertension, headaches presents to the ED for evaluation of chest pain that began an hour ago while she was sitting on her bed.  Sudden, moderate to severe described as a tight, squeezing.  It radiates into the middle of her back.  It is worse with taking deep breaths, moving.  Has associated headache, shortness of breath because it hurts to take deep breaths, dizziness, nausea, 2 episodes of none bloody nonbilious vomiting, 3 episodes of diarrhea described as dark brown, upper abdominal pain.  Has also had a dry cough and runny nose.  She is fully vaccinated for COVID.  States she was sober until May of this year but relapsed 2 months ago.  Reports marriage issues.  Has been drinking 1 pint of liquor daily for the last month.  Today she only drank 3 beers because she did not have any liquor.  Also uses THC.  Reports her swallowing problems improved after her EGD and dilation but after 1 month it started to come back.  She has pain and difficulty with eating solids and it feels like it stuck in the bottom of her chest.  No problems with drinking liquids.  Patient is seen in the ER in the past for chest pain but states today her chest pain is much more severe. Has had headaches when she stops drinking but no other severe withdrawal symptoms.   No fevers.  No congestion.  No sick contacts. No hemoptysis, calf pain, hormone therapy, recent surgery or prolonged immobilization. Chronic ankle edema, bilaterally.  Denies IVDU."  On my evaluation, the patient notes that 4 days ago she was assisting her dog, who wears a harness, up the stairs when she felt a "pop" in  her right upper back.  She did not begin having pain in the area until the last 24 hours.  States that the pain radiates through her right upper back through her shoulder and into her right upper chest wall.  The intensity of the pain causes shortness of breath.  She did not have any dizziness, nausea, or vomiting until today.  She does have a remote history of pancreatitis and is unsure of her symptoms today similar to previous episodes of pancreatitis.  She denies SI, HI, or auditory or visual hallucinations.  She would like assistance with alcohol cessation and outpatient resources.  Physical Exam  BP (!) 150/85   Pulse 88   Temp 98.7 F (37.1 C)   Resp 15   Ht 5\' 5"  (1.651 m)   Wt 72.6 kg   LMP 10/12/2019   SpO2 99%   BMI 26.63 kg/m   Physical Exam Vitals and nursing note reviewed.  Constitutional:      General: She is not in acute distress.    Appearance: She is not ill-appearing, toxic-appearing or diaphoretic.  HENT:     Head: Normocephalic.  Eyes:     Conjunctiva/sclera: Conjunctivae normal.  Cardiovascular:     Rate and Rhythm: Normal rate and regular rhythm.     Heart sounds: No murmur heard.  No friction rub. No gallop.   Pulmonary:     Effort: Pulmonary effort is normal. No respiratory distress.  Comments: Lungs are clear to auscultation bilaterally.  No increased work of breathing.  No reproducible tenderness to palpation to the chest wall. Chest:     Chest wall: No tenderness.  Abdominal:     General: There is no distension.     Palpations: Abdomen is soft. There is no mass.     Tenderness: There is abdominal tenderness. There is no right CVA tenderness, left CVA tenderness, guarding or rebound.     Hernia: No hernia is present.     Comments: Mild tenderness palpation in the epigastric region.  No right upper quadrant tenderness.  Negative Murphy sign.  Abdomen is soft and nondistended.  Normoactive bowel sounds.  No CVA tenderness bilaterally.   Musculoskeletal:     Cervical back: Neck supple.     Comments: Diffusely tender to palpation over the right shoulder and right periscapular area.  Muscle spasms are noted.  Full active and passive range of motion of the right shoulder, elbow, and wrist.  Neurovascular intact throughout the bilateral upper extremities.  Negative empty can test.  Negative Apley scratch test.  No tenderness to the cervical, thoracic, or lumbar spinous processes or bilateral paraspinal muscles.  Skin:    General: Skin is warm.     Findings: No rash.  Neurological:     Mental Status: She is alert.  Psychiatric:        Behavior: Behavior normal.     ED Course/Procedures   Clinical Course as of Oct 19 853  Wed Oct 19, 2019  2237 D-Dimer, Quant: 0.30 [CG]  2251 Hgb range 9.3-11 in the last year.  Patient states she has history of endometriosis with irregular and heavy periods.  She typically gets menstrual flow 3-4 times a month lasting 3 to 4 days.  Her last menses was 1 week ago.  Hemoglobin(!): 8.8 [CG]  2313 Lipase(!): 88 [CG]  2313 Potassium(!): 3.2 [CG]  2313 AST(!): 44 [CG]  2313 Alkaline Phosphatase(!): 36 [CG]  2313 Re-evaluated patient, feels better but reports ongoing CP although milder   Troponin I (High Sensitivity): 6 [CG]    Clinical Course User Index [CG] Liberty Handy, PA-C    Procedures  MDM   49 year old female with past medical history of alcohol abuse with recent relapse 2 months ago, PE s/p warfarin, postpartum cardiomyopathy in 2000, endometriosis, GERD, dysphagia and esophagitis status post EGD and dilation, hypertension, headaches who is received a signout from [employer].  Please see her note for further work-up and medical decision making.  Patient was given GI cocktail and on re-evaluation she reported no improvement in her pain.  Given mild elevation in lipase, heavy recent alcohol use, and history of pancreatitis, CT abdomen pelvis was obtained to assess for early  pancreatitis.  CT abdomen pelvis was unremarkable.  Patient reported compliance with her home PPI and Carafate.  She was given a dose of Pepcid and Carafate by me with no improvement in her symptoms.  Reports that opioids have been the only intervention that have improved her pain.  On reevaluation, patient's pain and symptoms are more localized to the right thoracic and cervical back and patient has reproducible muscle spasms on Dr. Harland Dingwall, attending physician, evaluation.  He recommends Toradol and Robaxin.  Patient reportedly had previous gastric ulcer, but this was not noticed on EGD performed earlier this year.  Doubt ruptured peptic ulcer.  Doubt ACS.  Doubt GI bleed.  On reevaluation, she is much improved.  Will place peer consult for assistance  with alcohol cessation.  CIWA score is zero.  She has no evidence of alcohol withdrawal at this time.  She has no history of DTs or seizure.  Will defer Librium taper at this time.  She has been given outpatient resources.  She was also encouraged to follow-up with GI.  ER return precautions given.  She is hemodynamically stable and in no acute distress.  Safe for discharge home with outpatient follow-up as indicated.     Barkley Boards, PA-C 10/20/19 0855    Nira Conn, MD 10/23/19 2021

## 2019-10-19 NOTE — Discharge Instructions (Addendum)
Thank you for allowing me to care for you today in the Emergency Department.   You can take 650 mg of Tylenol once every 6 hours as needed for pain. You should not take this medication if you are drinking alcohol as it can be harmful to your liver. Take 1 tablet of Robaxin 2 times daily for muscle spasms. Do not take this medication with other substances that can make you drowsy or sedated. Do not work or drive until you know how this medication impacts you.  See resources attached, call and go to a facility that has inpatient and/or outpatient detox services. I have also placed a consult for peers support to contact you in follow-up from your ER visit.  Follow-up with primary care if your symptoms do not significantly improve with this regimen after 1 week.  Return for headaches, vision changes, bright red blood or black vomit or stool, chest pain, shortness of breath, suicidal or homicidal thoughts, auditory or visual or tactile hallucinations, seizures  [employer] [address] [place], [place] [address] [phone]  [facility]  [address] [place] [address] [phone]   [facility]   [address] [place], [place] [address]  [phone]  [facility]  [address] [place], [place] [address]  [phone]

## 2019-10-19 NOTE — ED Provider Notes (Signed)
[facility] EMERGENCY DEPARTMENT Provider Note   CSN: [id number] Arrival date & time: 10/19/19  2036     History Chief Complaint  Patient presents with  . Chest Pain  . Shortness of Breath    Nicole Hughes is a 49 y.o. female with past medical history of alcohol abuse with recent relapse 2 months ago, PE s/p warfarin, postpartum cardiomyopathy in 2000, endometriosis, GERD, dysphagia and esophagitis status post EGD and dilation, hypertension, headaches presents to the ED for evaluation of chest pain that began an hour ago while she was sitting on her bed.  Sudden, moderate to severe described as a tight, squeezing.  It radiates into the middle of her back.  It is worse with taking deep breaths, moving.  Has associated headache, shortness of breath because it hurts to take deep breaths, dizziness, nausea, 2 episodes of none bloody nonbilious vomiting, 3 episodes of diarrhea described as dark brown, upper abdominal pain.  Has also had a dry cough and runny nose.  She is fully vaccinated for COVID.  States she was sober until May of this year but relapsed 2 months ago.  Reports marriage issues.  Has been drinking 1 pint of liquor daily for the last month.  Today she only drank 3 beers because she did not have any liquor.  Also uses THC.  Reports her swallowing problems improved after her EGD and dilation but after 1 month it started to come back.  She has pain and difficulty with eating solids and it feels like it stuck in the bottom of her chest.  No problems with drinking liquids.  Patient is seen in the ER in the past for chest pain but states today her chest pain is much more severe. Has had headaches when she stops drinking but no other severe withdrawal symptoms.   No fevers.  No congestion.  No sick contacts. No hemoptysis, calf pain, hormone therapy, recent surgery or prolonged immobilization. Chronic ankle edema, bilaterally.  Denies IVDU.  HPI     Past Medical  History:  Diagnosis Date  . Anxiety   . Cardiomyopathy (HCC)   . Chronic lower back pain   . Complication of anesthesia    "hard to get under"  . Cyst of ovary   . Depression    hx  . DVT complicating pregnancy 08/1998   RLE; RUE  . Dysmenorrhea   . Dyspareunia   . 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 GERD (gastroesophageal reflux disease)   . Hay fever    "fall and spring" (02/17/2017)  . Headache    "2-3 times/.wik" (02/17/2017)  . History of chicken pox   . Hypertension   . Migraine    "monthly" (02/17/2017)  . Pelvic pain   . Pulmonary embolism (HCC) 08/1998   S/P childbirth    Patient Active Problem List   Diagnosis Date Noted  . Dysphagia 05/31/2019  . Insect bite 06/21/2018  . Pedal edema 03/31/2018  . Anemia 03/31/2018  . Appetite loss 03/08/2018  . Myofascial pain 03/08/2018  . IBS (irritable bowel syndrome) 03/08/2018  . Alcoholic hepatitis without ascites 03/05/2018  . Elevated LFTs 03/03/2018  . Achilles tendonitis 01/28/2018  . Abnormal cervical Papanicolaou smear 09/29/2017  . Insomnia 09/29/2017  . Pain 09/29/2017  . Palpitations 05/25/2017  . Chest wall pain 05/25/2017  . Elevated transaminase level 05/25/2017  . Depression with anxiety 02/23/2017  . 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 Hyponatremia 02/17/2017  . Hypokalemia 09/24/2016  .  Alcohol abuse 09/22/2016  . H/O acute pancreatitis 09/22/2016  . GERD (gastroesophageal reflux disease) 04/09/2015  . Epigastric pain 01/17/2014  . 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 Chest pain 08/20/2012  . Stress reaction 07/23/2012  . Hypertension 06/09/2012  . History of pulmonary embolism 06/09/2012  . Preop cardiovascular exam 06/08/2012  . Dysmenorrhea   . Dyspareunia   . Pelvic pain     Past Surgical History:  Procedure Laterality Date  . CESAREAN SECTION  1994  . ECTOPIC PREGNANCY SURGERY  2010  . LAPAROSCOPY N/A 09/24/2012   Procedure: OPERATIVE LAPROSCOPY WITH LYSIS OF  ADHESIONS;  Surgeon: Sherron MondayJody [name], MD;  Location: [facility];  Service: Gynecology;  Laterality: N/A;  . TONSILLECTOMY  1984  . TUBAL LIGATION  2010     OB History   No obstetric history on file.     Family History  Problem Relation Age of Onset  . Cancer - Lung Mother   . Heart failure Father   . Hypertension Father     Social History   Tobacco Use  . Smoking status: Never Smoker  . Smokeless tobacco: Never Used  Vaping Use  . Vaping Use: Never used  Substance Use Topics  . Alcohol use: Yes    Alcohol/week: 5.0 standard drinks    Types: 5 Cans of beer per week  . Drug use: Yes    Types: Marijuana    Home Medications Prior to Admission medications   Medication Sig Start Date End Date Taking? Authorizing Provider  amLODipine (NORVASC) 10 MG tablet Take 1 tablet by mouth once daily 06/07/19   Tower, Audrie GallusMarne A, MD  etodolac (LODINE) 300 MG capsule Take 1 capsule (300 mg total) by mouth 3 (three) times daily. 08/26/19   Linwood DibblesKnapp, Jon, MD  hydrochlorothiazide (MICROZIDE) 12.5 MG capsule TAKE 1 CAPSULE BY MOUTH  DAILY 10/03/19   Rosalio MacadamiaGerhardt, Lori C, NP  hydrOXYzine (ATARAX/VISTARIL) 25 MG tablet TAKE 1 TABLET BY MOUTH EVERY 8 HOURS AS NEEDED FOR ANXIETY OR  NAUSEA.  CAUTION  OF  SEDATION Patient taking differently: Take 25 mg by mouth every 8 (eight) hours as needed for nausea or vomiting.  06/08/19   Tower, Audrie GallusMarne A, MD  lisinopril (ZESTRIL) 10 MG tablet Take 2 tablets by mouth once daily 08/24/19   Tower, Audrie GallusMarne A, MD  metoprolol succinate (TOPROL-XL) 100 MG 24 hr tablet Take 1 tablet (100 mg total) by mouth daily. Take with or immediately following a meal. 07/04/19 10/02/19  Rosalio MacadamiaGerhardt, Lori C, NP  mirtazapine (REMERON) 15 MG tablet TAKE 1 TABLET BY MOUTH AT BEDTIME Patient taking differently: Take 15 mg by mouth at bedtime as needed (sleep).  05/02/19   Tower, Audrie GallusMarne A, MD  naltrexone (DEPADE) 50 MG tablet Take 1 tablet (50 mg total) by mouth daily. Patient not taking: Reported on 08/26/2019 04/29/19    Tower, Audrie GallusMarne A, MD  norethindrone (AYGESTIN) 5 MG tablet Take 5 mg by mouth daily. 08/08/19   [provider]  omeprazole (PRILOSEC) 20 MG capsule Take 20 mg by mouth 2 (two) times daily before a meal.    [provider]  sertraline (ZOLOFT) 50 MG tablet Take 100 mg by mouth daily.    [provider]  sucralfate (CARAFATE) 1 GM/10ML suspension Take 10 mLs (1 g total) by mouth 4 (four) times daily. 06/21/19   Armbruster, Willaim RayasSteven P, MD    Allergies    Patient has no known allergies.  Review of Systems   Review of Systems  HENT: Positive for rhinorrhea.   Respiratory: Positive for cough and shortness of breath.   Cardiovascular: Positive for chest pain.  Gastrointestinal: Positive for abdominal pain, diarrhea, nausea and vomiting.  Neurological: Positive for headaches.  All other systems reviewed and are negative.   Physical Exam Updated Vital Signs BP (!) 156/92 (BP Location: Right Arm)   Pulse 93   Temp 98.6 F (37 C) (Oral)   Resp 18   Ht 5\' 5"  (1.651 m)   Wt 72.6 kg   LMP 10/12/2019   SpO2 99%   BMI 26.63 kg/m   Physical Exam Vitals and nursing note reviewed.  Constitutional:      Appearance: She is well-developed.     Comments: Nontoxic but appears anxious  HENT:     Head: Normocephalic and atraumatic.     Nose: Nose normal.  Eyes:     Conjunctiva/sclera: Conjunctivae normal.  Neck:     Comments: No anterior neck/supraclavicular crepitus, tenderness  Cardiovascular:     Rate and Rhythm: Normal rate and regular rhythm.     Comments: 1+ radial and DP pulses bilaterally.  No lower extremity edema.  No calf tenderness.  No murmurs.  Hypertensive.  No chest wall tenderness. Pulmonary:     Effort: Pulmonary effort is normal.     Breath sounds: Normal breath sounds.     Comments: Normal work of breathing.  Speaking in full sentences.  SPO2 greater than 95% on room air.  No wheezing, crackles. Abdominal:     General: Bowel sounds are normal.      Palpations: Abdomen is soft.     Tenderness: There is abdominal tenderness.     Comments: Mild/moderate epigastric tenderness.  No G/R/R. No suprapubic or CVA tenderness. Negative Murphy's and McBurney's. Active BS to lower quadrants.   Genitourinary:    Rectum: External hemorrhoid present.     Comments: Non tender large hemorrhoid 6 o'clock without tenderness or obvious bleeding/friability. Normal perianal skin without fissures. Unable to obtain stool during DRE. Normal rectal tone. No gross BRB or melena noted.   Musculoskeletal:        General: Normal range of motion.     Cervical back: Normal range of motion.  Skin:    General: Skin is warm and dry.     Capillary Refill: Capillary refill takes less than 2 seconds.  Neurological:     Mental Status: She is alert.     Comments: Sensation and strength intact in upper and lower extremities.  Speech clear.  Mild hand tremors bilaterally.  No tongue tremor.  Psychiatric:        Behavior: Behavior normal.     ED Results / Procedures / Treatments   Labs (all labs ordered are listed, but only abnormal results are displayed) Labs Reviewed  CBC WITH DIFFERENTIAL/PLATELET - Abnormal; Notable for the following components:      Result Value   Hemoglobin 8.8 (*)    HCT 30.2 (*)    MCV 76.3 (*)    MCH 22.2 (*)    MCHC 29.1 (*)    RDW 17.7 (*)    All other components within normal limits  COMPREHENSIVE METABOLIC PANEL - Abnormal; Notable for the following components:   Sodium 134 (*)    Potassium 3.2 (*)    Glucose, Bld 131 (*)    AST 44 (*)    Alkaline Phosphatase 36 (*)    All other components within normal limits  LIPASE, BLOOD - Abnormal; Notable for the following  components:   Lipase 88 (*)    All other components within normal limits  URINALYSIS, COMPLETE (UACMP) WITH MICROSCOPIC - Abnormal; Notable for the following components:   Color, Urine STRAW (*)    Specific Gravity, Urine 1.002 (*)    Bacteria, UA FEW (*)    All other  components within normal limits  ETHANOL - Abnormal; Notable for the following components:   Alcohol, Ethyl (B) 29 (*)    All other components within normal limits  RAPID URINE DRUG SCREEN, HOSP PERFORMED - Abnormal; Notable for the following components:   Opiates POSITIVE (*)    Tetrahydrocannabinol POSITIVE (*)    All other components within normal limits  RESPIRATORY PANEL BY RT PCR (FLU A&B, COVID)  D-DIMER, QUANTITATIVE (NOT AT [facility])  I-STAT BETA HCG BLOOD, ED ([facility], [facility], [facility] ONLY)  POC OCCULT BLOOD, ED  TROPONIN I (HIGH SENSITIVITY)  TROPONIN I (HIGH SENSITIVITY)    EKG None  Radiology DG Abd Acute W/Chest  Result Date: 10/19/2019 CLINICAL DATA:  Epigastric pain and shortness of breath EXAM: X-RAY ABDOMEN 3 VIEW COMPARISON:  06/05/2008, 05/29/2019 FINDINGS: Cardiac shadow is within normal limits. The lungs are well aerated bilaterally. No bony abnormality is seen. Scattered large and small bowel gas is seen. No free air is noted. No obstructive changes are seen. No bony abnormality is noted. IMPRESSION: No acute abnormality in the chest and abdomen. Electronically Signed   By: Alcide Clever M.D.   On: 10/19/2019 23:33    Procedures Procedures (including critical care time)  Medications Ordered in ED Medications  alum & mag hydroxide-simeth (MAALOX/MYLANTA) 200-200-20 MG/5ML suspension 30 mL (has no administration in time range)    And  lidocaine (XYLOCAINE) 2 % viscous mouth solution 15 mL (has no administration in time range)  LORazepam (ATIVAN) injection 1 mg (1 mg Intravenous Given 10/19/19 2137)  morphine 4 MG/ML injection 4 mg (4 mg Intravenous Given 10/19/19 2138)  sodium chloride 0.9 % bolus 1,000 mL (0 mLs Intravenous Stopped 10/19/19 2303)  metoCLOPramide (REGLAN) injection 5 mg (5 mg Intravenous Given 10/19/19 2138)  pantoprazole (PROTONIX) injection 40 mg (40 mg Intravenous Given 10/19/19 2301)    ED Course  I have reviewed the triage vital signs and the nursing  notes.  Pertinent labs & imaging results that were available during my care of the patient were reviewed by me and considered in my medical decision making (see chart for details).  Clinical Course as of Oct 19 2354  Wed Oct 19, 2019  2237 D-Dimer, Quant: 0.30 [CG]  2251 Hgb range 9.3-11 in the last year.  Patient states she has history of endometriosis with irregular and heavy periods.  She typically gets menstrual flow 3-4 times a month lasting 3 to 4 days.  Her last menses was 1 week ago.  Hemoglobin(!): 8.8 [CG]  2313 Lipase(!): 88 [CG]  2313 Potassium(!): 3.2 [CG]  2313 AST(!): 44 [CG]  2313 Alkaline Phosphatase(!): 36 [CG]  2313 Re-evaluated patient, feels better but reports ongoing CP although milder   Troponin I (High Sensitivity): 6 [CG]    Clinical Course User Index [CG] Jerrell Mylar   MDM Rules/Calculators/A&P                          49 year old female with history of alcohol abuse with recent relapse, esophagitis s/p EGD and dilation presents to the ED for evaluation of chest pain sudden onset 1 hour prior to arrival while  laying in bed.  Runny nose, cough, pleuritic chest pain with deep breaths, nausea, vomiting, dark diarrhea.  History of PE.  Fully vaccinated for COVID-19.  Reports lately return of dysphagia with solids. Has had less alcohol than usual in the last 24 hrs. H/o similar pain for some time worse today.   Ddx includes atypical CP like GERD/PUD, pancreatitis, perforation. However patient with several other symptoms like rhinorrhea, cough, diarrhea. Viral illness, COVID, considered. ACS, PE a possibility but again all other symptoms make this less likely. No hypoxia, tachycardia or tachypnea thus far. Some degree of mild ETOH withdrawal could be contributing.   I have ordered abdominal and cardiac lab work including CBC CMP lipase EtOH UA HCG POC hemoccult, troponin D-dimer COVID  I have ordered imaging including KUB, EKG.    I have ordered  continuous cardiac and pulse oximeter monitoring.  I have ordered medications including IVF ativan morphine reglan protonix.   Will plan to reassess.   2350: Patient reevaluated and reports mild improvement in chest pain still 7/10, nausea has improved.  She looks less uncomfortable.  Persistent epigastric tenderness.  No peritonitis.   ER work-up personally visualized and interpreted.  CBC with hemoglobin 8.8 in the last year her range has been 9-11, in setting of menorrhagia/endometriosis.  Hemoccult pending, no gross melena on exam but unable to obtain stool.  Patient unable to have BM here.  K3.2, glucose 131 with normal anion gap.  LFTs unremarkable.  Lipase 88.  EtOH 29.  Troponin VI, pending delta.  D-dimer negative.  EKG without acute ischemic changes, right ventricular strain, arrhythmias.  KUB without free air, widening of mediastinum.  0000: GI cocktail ordered.  Pending POC Hemoccult, troponin, UA, UDS.  Patient will be handed off to oncoming ED PA who will reassess patient.  Consider higher level of imaging if continued or worsening pain, clinical decline.  High suspicion for GI etiology. Patient requesting outpatient substance abuse resources, included in AVS.   Final Clinical Impression(s) / ED Diagnoses Final diagnoses:  Atypical chest pain  Anemia, unspecified type    Rx / DC Orders ED Discharge Orders    None       Liberty Handy, PA-C 10/19/19 2356    Rolan Bucco, MD 10/20/19 1018

## 2019-10-20 ENCOUNTER — Emergency Department (HOSPITAL_COMMUNITY): Payer: 59

## 2019-10-20 LAB — TROPONIN I (HIGH SENSITIVITY): Troponin I (High Sensitivity): 6 ng/L (ref <18)

## 2019-10-20 MED ORDER — FAMOTIDINE IN NACL 20-0.9 MG/50ML-% IV SOLN
20.0000 mg | Freq: Once | INTRAVENOUS | Status: AC
  Administered 2019-10-20: 20 mg via INTRAVENOUS
  Filled 2019-10-20: qty 50

## 2019-10-20 MED ORDER — IOHEXOL 300 MG/ML  SOLN
100.0000 mL | Freq: Once | INTRAMUSCULAR | Status: AC | PRN
  Administered 2019-10-20: 100 mL via INTRAVENOUS

## 2019-10-20 MED ORDER — METHOCARBAMOL 500 MG PO TABS
500.0000 mg | ORAL_TABLET | Freq: Once | ORAL | Status: AC
  Administered 2019-10-20: 500 mg via ORAL
  Filled 2019-10-20: qty 1

## 2019-10-20 MED ORDER — SUCRALFATE 1 GM/10ML PO SUSP
1.0000 g | Freq: Once | ORAL | Status: AC
  Administered 2019-10-20: 1 g via ORAL
  Filled 2019-10-20: qty 10

## 2019-10-20 MED ORDER — HYDROMORPHONE HCL 1 MG/ML IJ SOLN
1.0000 mg | Freq: Once | INTRAMUSCULAR | Status: AC
  Administered 2019-10-20: 1 mg via INTRAVENOUS
  Filled 2019-10-20: qty 1

## 2019-10-20 MED ORDER — ONDANSETRON HCL 4 MG/2ML IJ SOLN
4.0000 mg | Freq: Once | INTRAMUSCULAR | Status: AC
  Administered 2019-10-20: 4 mg via INTRAVENOUS
  Filled 2019-10-20: qty 2

## 2019-10-20 MED ORDER — METHOCARBAMOL 500 MG PO TABS: 500.0000 mg | ORAL_TABLET | Freq: Two times a day (BID) | ORAL | 0 refills | Status: DC

## 2019-10-20 MED ORDER — KETOROLAC TROMETHAMINE 30 MG/ML IJ SOLN
30.0000 mg | Freq: Once | INTRAMUSCULAR | Status: AC
  Administered 2019-10-20: 30 mg via INTRAVENOUS
  Filled 2019-10-20: qty 1

## 2019-10-28 ENCOUNTER — Other Ambulatory Visit: Payer: Self-pay | Admitting: Family Medicine

## 2019-11-27 ENCOUNTER — Observation Stay (HOSPITAL_COMMUNITY)
Admission: EM | Admit: 2019-11-27 | Discharge: 2019-11-27 | Disposition: A | Discharge status: Home / Self Care | Payer: 59 | Attending: Internal Medicine | Admitting: Internal Medicine

## 2019-11-27 ENCOUNTER — Other Ambulatory Visit: Payer: Self-pay

## 2019-11-27 ENCOUNTER — Encounter (HOSPITAL_COMMUNITY): Payer: Self-pay | Admitting: Emergency Medicine

## 2019-11-27 DIAGNOSIS — N179 Acute kidney failure, unspecified: Secondary | ICD-10-CM | POA: Diagnosis not present

## 2019-11-27 DIAGNOSIS — R112 Nausea with vomiting, unspecified: Secondary | ICD-10-CM | POA: Diagnosis present

## 2019-11-27 DIAGNOSIS — I10 Essential (primary) hypertension: Secondary | ICD-10-CM | POA: Diagnosis not present

## 2019-11-27 DIAGNOSIS — F10239 Alcohol dependence with withdrawal, unspecified: Secondary | ICD-10-CM | POA: Insufficient documentation

## 2019-11-27 DIAGNOSIS — R000 Tachycardia, unspecified: Secondary | ICD-10-CM | POA: Insufficient documentation

## 2019-11-27 DIAGNOSIS — Z79899 Other long term (current) drug therapy: Secondary | ICD-10-CM | POA: Insufficient documentation

## 2019-11-27 DIAGNOSIS — Z20822 Contact with and (suspected) exposure to covid-19: Secondary | ICD-10-CM | POA: Diagnosis not present

## 2019-11-27 DIAGNOSIS — F10230 Alcohol dependence with withdrawal, uncomplicated: Secondary | ICD-10-CM

## 2019-11-27 DIAGNOSIS — F10939 Alcohol use, unspecified with withdrawal, unspecified: Secondary | ICD-10-CM | POA: Diagnosis present

## 2019-11-27 DIAGNOSIS — E876 Hypokalemia: Secondary | ICD-10-CM | POA: Diagnosis not present

## 2019-11-27 LAB — CBC
HCT: 36.3 % (ref 36.0–46.0)
Hemoglobin: 11.1 g/dL — ABNORMAL LOW (ref 12.0–15.0)
MCH: 24.5 pg — ABNORMAL LOW (ref 26.0–34.0)
MCHC: 30.6 g/dL (ref 30.0–36.0)
MCV: 80.1 fL (ref 80.0–100.0)
Platelets: 302 10*3/uL (ref 150–400)
RBC: 4.53 MIL/uL (ref 3.87–5.11)
RDW: 21.7 % — ABNORMAL HIGH (ref 11.5–15.5)
WBC: 8.3 10*3/uL (ref 4.0–10.5)
nRBC: 0.5 % — ABNORMAL HIGH (ref 0.0–0.2)

## 2019-11-27 LAB — RESP PANEL BY RT-PCR (FLU A&B, COVID) ARPGX2
Influenza A by PCR: NEGATIVE
Influenza B by PCR: NEGATIVE
SARS Coronavirus 2 by RT PCR: NEGATIVE

## 2019-11-27 LAB — COMPREHENSIVE METABOLIC PANEL
ALT: 42 U/L (ref 0–44)
AST: 126 U/L — ABNORMAL HIGH (ref 15–41)
Albumin: 5.9 g/dL — ABNORMAL HIGH (ref 3.5–5.0)
Alkaline Phosphatase: 53 U/L (ref 38–126)
Anion gap: 20 — ABNORMAL HIGH (ref 5–15)
BUN: 14 mg/dL (ref 6–20)
CO2: 20 mmol/L — ABNORMAL LOW (ref 22–32)
Calcium: 9.7 mg/dL (ref 8.9–10.3)
Chloride: 98 mmol/L (ref 98–111)
Creatinine, Ser: 2.31 mg/dL — ABNORMAL HIGH (ref 0.44–1.00)
GFR, Estimated: 26 mL/min — ABNORMAL LOW (ref >60)
Glucose, Bld: 150 mg/dL — ABNORMAL HIGH (ref 70–99)
Potassium: 2.9 mmol/L — ABNORMAL LOW (ref 3.5–5.1)
Sodium: 138 mmol/L (ref 135–145)
Total Bilirubin: 1 mg/dL (ref 0.3–1.2)
Total Protein: 9.8 g/dL — ABNORMAL HIGH (ref 6.5–8.1)

## 2019-11-27 LAB — URINALYSIS, ROUTINE W REFLEX MICROSCOPIC
Bilirubin Urine: NEGATIVE
Glucose, UA: NEGATIVE mg/dL
Ketones, ur: 5 mg/dL — AB
Nitrite: NEGATIVE
Protein, ur: >=300 mg/dL — AB
Specific Gravity, Urine: 1.024 (ref 1.005–1.030)
WBC, UA: >50 WBC/hpf — ABNORMAL HIGH (ref 0–5)
pH: 5 (ref 5.0–8.0)

## 2019-11-27 LAB — LIPASE, BLOOD: Lipase: 132 U/L — ABNORMAL HIGH (ref 11–51)

## 2019-11-27 LAB — I-STAT BETA HCG BLOOD, ED (MC, WL, AP ONLY): I-stat hCG, quantitative: <5 m[IU]/mL (ref <5)

## 2019-11-27 LAB — MAGNESIUM: Magnesium: 1.2 mg/dL — ABNORMAL LOW (ref 1.7–2.4)

## 2019-11-27 LAB — LACTIC ACID, PLASMA: Lactic Acid, Venous: 1.6 mmol/L (ref 0.5–1.9)

## 2019-11-27 LAB — BETA-HYDROXYBUTYRIC ACID: Beta-Hydroxybutyric Acid: 1.43 mmol/L — ABNORMAL HIGH (ref 0.05–0.27)

## 2019-11-27 MED ORDER — POTASSIUM CHLORIDE 10 MEQ/100ML IV SOLN
10.0000 meq | INTRAVENOUS | Status: AC
  Administered 2019-11-27: 10 meq via INTRAVENOUS
  Filled 2019-11-27 (×2): qty 100

## 2019-11-27 MED ORDER — LACTATED RINGERS IV BOLUS
2000.0000 mL | Freq: Once | INTRAVENOUS | Status: AC
  Administered 2019-11-27: 1000 mL via INTRAVENOUS

## 2019-11-27 MED ORDER — ONDANSETRON HCL 4 MG/2ML IJ SOLN
4.0000 mg | Freq: Once | INTRAMUSCULAR | Status: AC
  Administered 2019-11-27: 4 mg via INTRAVENOUS
  Filled 2019-11-27: qty 2

## 2019-11-27 MED ORDER — MAGNESIUM OXIDE 400 (241.3 MG) MG PO TABS
800.0000 mg | ORAL_TABLET | Freq: Once | ORAL | Status: AC
  Administered 2019-11-27: 800 mg via ORAL
  Filled 2019-11-27: qty 2

## 2019-11-27 NOTE — ED Provider Notes (Signed)
[facility]-EMERGENCY DEPT Provider Note   CSN: [id number] Arrival date & time: 11/27/19  4166     History Chief Complaint  Patient presents with  . Nausea  . Emesis  . Spasms    Nicole Hughes is a 49 y.o. female.  49 yo F with a chief complaint of nausea and vomiting.  Going on for a few days now.  No abdominal pain having some chills but no fevers.  Diarrhea as well.  She is concerned that she may have Covid.  Requesting a Covid test.  Denies cough or congestion.  She also has been having diffuse spasms to her arms legs and her cheek.  She had had problems with recurrent nausea and vomiting but thought to be due to alcohol abuse.  She has stopped drinking since her last ED visit.  The history is provided by the patient.  Emesis Associated symptoms: diarrhea   Associated symptoms: no abdominal pain, no arthralgias, no chills, no fever, no headaches and no myalgias   Illness Severity:  Moderate Onset quality:  Gradual Duration:  4 days Timing:  Constant Progression:  Worsening Chronicity:  New Associated symptoms: diarrhea, nausea and vomiting   Associated symptoms: no abdominal pain, no chest pain, no congestion, no fever, no headaches, no myalgias, no rhinorrhea, no shortness of breath and no wheezing        Past Medical History:  Diagnosis Date  . Anxiety   . Cardiomyopathy (HCC)   . Chronic lower back pain   . Complication of anesthesia    "hard to get under"  . Cyst of ovary   . Depression    hx  . DVT complicating pregnancy 08/1998   RLE; RUE  . Dysmenorrhea   . Dyspareunia   . 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 GERD (gastroesophageal reflux disease)   . Hay fever    "fall and spring" (02/17/2017)  . Headache    "2-3 times/.wik" (02/17/2017)  . History of chicken pox   . Hypertension   . Migraine    "monthly" (02/17/2017)  . Pelvic pain   . Pulmonary embolism (HCC) 08/1998   S/P childbirth    Patient Active  Problem List   Diagnosis Date Noted  . Intractable nausea and vomiting 11/27/2019  . Alcohol withdrawal (HCC) 11/27/2019  . Dysphagia 05/31/2019  . Insect bite 06/21/2018  . Pedal edema 03/31/2018  . Anemia 03/31/2018  . Appetite loss 03/08/2018  . Myofascial pain 03/08/2018  . IBS (irritable bowel syndrome) 03/08/2018  . Alcoholic hepatitis without ascites 03/05/2018  . Elevated LFTs 03/03/2018  . Achilles tendonitis 01/28/2018  . Abnormal cervical Papanicolaou smear 09/29/2017  . Insomnia 09/29/2017  . Pain 09/29/2017  . Palpitations 05/25/2017  . Chest wall pain 05/25/2017  . Elevated transaminase level 05/25/2017  . Depression with anxiety 02/23/2017  . 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 Hyponatremia 02/17/2017  . Hypokalemia 09/24/2016  . Alcohol abuse 09/22/2016  . H/O acute pancreatitis 09/22/2016  . GERD (gastroesophageal reflux disease) 04/09/2015  . Epigastric pain 01/17/2014  . 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 Chest pain 08/20/2012  . Stress reaction 07/23/2012  . Hypertension 06/09/2012  . History of pulmonary embolism 06/09/2012  . Preop cardiovascular exam 06/08/2012  . Dysmenorrhea   . Dyspareunia   . Pelvic pain     Past Surgical History:  Procedure Laterality Date  . CESAREAN SECTION  1994  . ECTOPIC PREGNANCY SURGERY  2010  . LAPAROSCOPY N/A 09/24/2012  Procedure: OPERATIVE LAPROSCOPY WITH LYSIS OF ADHESIONS;  Surgeon: Sherron Monday, MD;  Location: [facility];  Service: Gynecology;  Laterality: N/A;  . TONSILLECTOMY  1984  . TUBAL LIGATION  2010     OB History   No obstetric history on file.     Family History  Problem Relation Age of Onset  . Cancer - Lung Mother   . Heart failure Father   . Hypertension Father     Social History   Tobacco Use  . Smoking status: Never Smoker  . Smokeless tobacco: Never Used  Vaping Use  . Vaping Use: Never used  Substance Use Topics  . Alcohol use: Yes    Alcohol/week: 5.0 standard drinks     Types: 5 Cans of beer per week  . Drug use: Yes    Types: Marijuana    Home Medications Prior to Admission medications   Medication Sig Start Date End Date Taking? Authorizing Provider  amLODipine (NORVASC) 10 MG tablet Take 1 tablet by mouth once daily 06/07/19  Yes Tower, Marne A, MD  hydrochlorothiazide (MICROZIDE) 12.5 MG capsule TAKE 1 CAPSULE BY MOUTH  DAILY 10/03/19  Yes Norma Fredrickson C, NP  hydrOXYzine (ATARAX/VISTARIL) 25 MG tablet TAKE 1 TABLET BY MOUTH EVERY 8 HOURS AS NEEDED FOR ANXIETY OR  NAUSEA.  CAUTION  OF  SEDATION Patient taking differently: Take 25 mg by mouth every 8 (eight) hours as needed for nausea or vomiting.  06/08/19  Yes Tower, Audrie Gallus, MD  metoprolol succinate (TOPROL-XL) 100 MG 24 hr tablet Take 1 tablet (100 mg total) by mouth daily. Take with or immediately following a meal. 07/04/19 11/27/19 Yes Gerhardt, Jennet Maduro, NP  mirtazapine (REMERON) 15 MG tablet TAKE 1 TABLET BY MOUTH AT BEDTIME Patient taking differently: Take 15 mg by mouth at bedtime as needed (sleep).  05/02/19  Yes Tower, Audrie Gallus, MD  omeprazole (PRILOSEC) 20 MG capsule Take 1 capsule by mouth once daily 10/28/19  Yes Tower, Marne A, MD  sertraline (ZOLOFT) 50 MG tablet Take 100 mg by mouth daily.   Yes [provider]  sucralfate (CARAFATE) 1 GM/10ML suspension Take 10 mLs (1 g total) by mouth 4 (four) times daily. Patient taking differently: Take 1 g by mouth 4 (four) times daily as needed (For constipation).  06/21/19  Yes Armbruster, Willaim Rayas, MD  etodolac (LODINE) 300 MG capsule Take 1 capsule (300 mg total) by mouth 3 (three) times daily. Patient not taking: Reported on 11/27/2019 08/26/19   Linwood Dibbles, MD  lisinopril (ZESTRIL) 10 MG tablet Take 2 tablets by mouth once daily Patient not taking: Reported on 11/27/2019 08/24/19   Tower, Audrie Gallus, MD  methocarbamol (ROBAXIN) 500 MG tablet Take 1 tablet (500 mg total) by mouth 2 (two) times daily. Patient not taking: Reported on 11/27/2019  10/20/19   McDonald, Mia A, PA-C  naltrexone (DEPADE) 50 MG tablet Take 1 tablet (50 mg total) by mouth daily. Patient not taking: Reported on 11/27/2019 04/29/19   Judy Pimple, MD    Allergies    Lisinopril  Review of Systems   Review of Systems  Constitutional: Negative for chills and fever.  HENT: Negative for congestion and rhinorrhea.   Eyes: Negative for redness and visual disturbance.  Respiratory: Negative for shortness of breath and wheezing.   Cardiovascular: Negative for chest pain and palpitations.  Gastrointestinal: Positive for diarrhea, nausea and vomiting. Negative for abdominal pain.  Genitourinary: Negative for dysuria and urgency.  Musculoskeletal: Negative for arthralgias  and myalgias.       Spasms   Skin: Negative for pallor and wound.  Neurological: Negative for dizziness and headaches.    Physical Exam Updated Vital Signs BP (!) 184/113   Pulse 99   Temp 98.3 F (36.8 C) (Oral)   Resp 18   SpO2 100%   Physical Exam Vitals and nursing note reviewed.  Constitutional:      General: She is not in acute distress.    Appearance: She is well-developed. She is not diaphoretic.  HENT:     Head: Normocephalic and atraumatic.  Eyes:     Pupils: Pupils are equal, round, and reactive to light.  Cardiovascular:     Rate and Rhythm: Regular rhythm. Tachycardia present.     Heart sounds: No murmur heard.  No friction rub. No gallop.   Pulmonary:     Effort: Pulmonary effort is normal.     Breath sounds: No wheezing or rales.  Abdominal:     General: There is no distension.     Palpations: Abdomen is soft.     Tenderness: There is no abdominal tenderness.  Musculoskeletal:        General: No tenderness.     Cervical back: Normal range of motion and neck supple.  Skin:    General: Skin is warm and dry.  Neurological:     Mental Status: She is alert and oriented to person, place, and time.  Psychiatric:        Behavior: Behavior normal.     ED  Results / Procedures / Treatments   Labs (all labs ordered are listed, but only abnormal results are displayed) Labs Reviewed  LIPASE, BLOOD - Abnormal; Notable for the following components:      Result Value   Lipase 132 (*)    All other components within normal limits  COMPREHENSIVE METABOLIC PANEL - Abnormal; Notable for the following components:   Potassium 2.9 (*)    CO2 20 (*)    Glucose, Bld 150 (*)    Creatinine, Ser 2.31 (*)    Total Protein 9.8 (*)    Albumin 5.9 (*)    AST 126 (*)    GFR, Estimated 26 (*)    Anion gap 20 (*)    All other components within normal limits  CBC - Abnormal; Notable for the following components:   Hemoglobin 11.1 (*)    MCH 24.5 (*)    RDW 21.7 (*)    nRBC 0.5 (*)    All other components within normal limits  URINALYSIS, ROUTINE W REFLEX MICROSCOPIC - Abnormal; Notable for the following components:   Color, Urine AMBER (*)    APPearance CLOUDY (*)    Hgb urine dipstick SMALL (*)    Ketones, ur 5 (*)    Protein, ur >=300 (*)    Leukocytes,Ua LARGE (*)    WBC, UA >50 (*)    Bacteria, UA MANY (*)    All other components within normal limits  BETA-HYDROXYBUTYRIC ACID - Abnormal; Notable for the following components:   Beta-Hydroxybutyric Acid 1.43 (*)    All other components within normal limits  MAGNESIUM - Abnormal; Notable for the following components:   Magnesium 1.2 (*)    All other components within normal limits  RESP PANEL BY RT-PCR (FLU A&B, COVID) ARPGX2  LACTIC ACID, PLASMA  LACTIC ACID, PLASMA  I-STAT BETA HCG BLOOD, ED ([facility], [facility], [facility] ONLY)    EKG EKG Interpretation  Date/Time:  Sunday November 27 2019  11:37:11 EST Ventricular Rate:  114 PR Interval:    QRS Duration: 96 QT Interval:  331 QTC Calculation: 456 R Axis:   37 Text Interpretation: Sinus tachycardia Low voltage, precordial leads ST elev, probable normal early repol pattern Baseline wander in lead(s) II V6 No significant change since last tracing Confirmed  by Melene Plan [phone]) on 11/27/2019 12:28:08 PM   Radiology No results found.  Procedures Procedures (including critical care time)  Medications Ordered in ED Medications  potassium chloride 10 mEq in 100 mL IVPB (0 mEq Intravenous Stopped 11/27/19 1337)  lactated ringers bolus 2,000 mL (0 mLs Intravenous Stopped 11/27/19 1359)  magnesium oxide (MAG-OX) tablet 800 mg (800 mg Oral Given 11/27/19 1238)  ondansetron (ZOFRAN) injection 4 mg (4 mg Intravenous Given 11/27/19 1226)    ED Course  I have reviewed the triage vital signs and the nursing notes.  Pertinent labs & imaging results that were available during my care of the patient were reviewed by me and considered in my medical decision making (see chart for details).    MDM Rules/Calculators/A&P                          49 yo F with a chief complaints of nausea vomiting diarrhea.  Going on for about 4 days now.  Having diffuse spasms.  Not really eating and drinking.  Heart rate in the 150s on arrival.  Sinus tachycardia.  Patient with an AKI with a baseline creatinine of about 0.6 and today it was 2.3.  Will give 2 L of IV fluids.  Potassium is low will replenish.  We will give a dose of magnesium and check mag levels.  Anion gap of 20, likely ketoacidosis due to dehydration we will send off beta hydroxybutyric acid.  With AKI and tachycardia will discuss with medicine.  CRITICAL CARE Performed by: Rae Roam   Total critical care time: 35 minutes  Critical care time was exclusive of separately billable procedures and treating other patients.  Critical care was necessary to treat or prevent imminent or life-threatening deterioration.  Critical care was time spent personally by me on the following activities: development of treatment plan with patient and/or surrogate as well as nursing, discussions with consultants, evaluation of patient's response to treatment, examination of patient, obtaining history from  patient or surrogate, ordering and performing treatments and interventions, ordering and review of laboratory studies, ordering and review of radiographic studies, pulse oximetry and re-evaluation of patient's condition.  The patients results and plan were reviewed and discussed.   Any x-rays performed were independently reviewed by myself.   Differential diagnosis were considered with the presenting HPI.  Medications  potassium chloride 10 mEq in 100 mL IVPB (0 mEq Intravenous Stopped 11/27/19 1337)  lactated ringers bolus 2,000 mL (0 mLs Intravenous Stopped 11/27/19 1359)  magnesium oxide (MAG-OX) tablet 800 mg (800 mg Oral Given 11/27/19 1238)  ondansetron (ZOFRAN) injection 4 mg (4 mg Intravenous Given 11/27/19 1226)    Vitals:   11/27/19 1138 11/27/19 1200 11/27/19 1215 11/27/19 1300  BP: (!) 167/122 (!) 173/113  (!) 184/113  Pulse: (!) 131 (!) 111  99  Resp: Temp:      TempSrc:      SpO2: 100% 99%  100%    Final diagnoses:  AKI (acute kidney injury) (HCC)  Tachycardia  Hypokalemia    Admission/ observation were discussed with the admitting physician, patient and/or family  and they are comfortable with the plan.   Final Clinical Impression(s) / ED Diagnoses Final diagnoses:  AKI (acute kidney injury) (HCC)  Tachycardia  Hypokalemia    Rx / DC Orders ED Discharge Orders    None       Melene PlanFloyd, Floria Brandau, DO 11/27/19 1429

## 2019-11-27 NOTE — Discharge Summary (Signed)
AMA Notice  After discussing care plan with patient, she states she wishes to go home instead. I have recommended that she stay for withdrawal treatment, but she insists on going and insists that she understands the consequences. She is leaving AMA.    HPI: Nicole Hughes is a 49 y.o. female with medical history significant of HTN, anxiety, EtOH abuse. Presenting with N/V. She states this started 2 days ago. There has been no hematemesis or diarrhea. She has had some cramping with it. She tried tylenol for treatment, but it did not help. She woke up weak and achy this morning, so she decided to come to the ED. She denies any aggravating or alleviating factors.   Of note, she states that she has been a daily drinker up to about a week ago (drank atleast 1/2 pint of liquor daily).    Assessment/Plan EtOH withdrawal     - recommend admission to progressive care floor w/ CIWAA protocol, thiamine, fluids     - I have discussed this with the patient and she states she wants to go home. She is A&O x 3. When I ask her if she understands the possible consequences of leaving now while she is in withdrawal, she states that she knows that anything can happen including death, but she needs to go home and take care of her father. I asked her again about consequences and she states flatly that she understands she could die.   Hypokalemia Hypomagnesemia     - replacement started in ED; would continue but patient is leaving AMA  AKI Proteinuria     - Scr is up     - fluids started in ED; patient is leaving AMA

## 2019-11-27 NOTE — ED Triage Notes (Signed)
Patient here from home reporting nausea vomiting since Friday. Reports that she is cramping all over including her face. Abnormal behavior in triage, possible withdrawals but patient denies.

## 2019-11-27 NOTE — H&P (Signed)
History and Physical    Nicole Hughes YIR:[id number] DOB: [birth year] DOA: 11/27/2019  PCP: Judy Pimple, MD  Patient coming from: Home  Chief Complaint: N/V  HPI: Nicole Hughes is a 49 y.o. female with medical history significant of HTN, anxiety, EtOH abuse. Presenting with N/V. She states this started 2 days ago. There has been no hematemesis or diarrhea. She has had some cramping with it. She tried tylenol for treatment, but it did not help. She woke up weak and achy this morning, so she decided to come to the ED. She denies any aggravating or alleviating factors.   Of note, she states that she has been a daily drinker up to about a week ago (drank atleast 1/2 pint of liquor daily).    ED Course: She was found to be hypertensive with elevated creatinine. Her potassium and magnesium were low. Replacement was begun. [facility] was called for admission.   Review of Systems:  Denies CP, palpitations, dyspnea, syncopal episodes, sick contacts. Review of systems is otherwise negative for all not mentioned in HPI.   PMHx Past Medical History:  Diagnosis Date  . Anxiety   . Cardiomyopathy (HCC)   . Chronic lower back pain   . Complication of anesthesia    "hard to get under"  . Cyst of ovary   . Depression    hx  . DVT complicating pregnancy 08/1998   RLE; RUE  . Dysmenorrhea   . Dyspareunia   . 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 GERD (gastroesophageal reflux disease)   . Hay fever    "fall and spring" (02/17/2017)  . Headache    "2-3 times/.wik" (02/17/2017)  . History of chicken pox   . Hypertension   . Migraine    "monthly" (02/17/2017)  . Pelvic pain   . Pulmonary embolism (HCC) 08/1998   S/P childbirth    PSHx Past Surgical History:  Procedure Laterality Date  . CESAREAN SECTION  1994  . ECTOPIC PREGNANCY SURGERY  2010  . LAPAROSCOPY N/A 09/24/2012   Procedure: OPERATIVE LAPROSCOPY WITH LYSIS OF ADHESIONS;  Surgeon: Sherron Monday, MD;  Location: [facility];   Service: Gynecology;  Laterality: N/A;  . TONSILLECTOMY  1984  . TUBAL LIGATION  2010    SocHx  reports that she has never smoked. She has never used smokeless tobacco. She reports current alcohol use of about 5.0 standard drinks of alcohol per week. She reports current drug use. Drug: Marijuana.  No Known Allergies  FamHx Family History  Problem Relation Age of Onset  . Cancer - Lung Mother   . Heart failure Father   . Hypertension Father     Prior to Admission medications   Medication Sig Start Date End Date Taking? Authorizing Provider  amLODipine (NORVASC) 10 MG tablet Take 1 tablet by mouth once daily 06/07/19   Tower, Audrie Gallus, MD  etodolac (LODINE) 300 MG capsule Take 1 capsule (300 mg total) by mouth 3 (three) times daily. 08/26/19   Linwood Dibbles, MD  hydrochlorothiazide (MICROZIDE) 12.5 MG capsule TAKE 1 CAPSULE BY MOUTH  DAILY 10/03/19   Rosalio Macadamia, NP  hydrOXYzine (ATARAX/VISTARIL) 25 MG tablet TAKE 1 TABLET BY MOUTH EVERY 8 HOURS AS NEEDED FOR ANXIETY OR  NAUSEA.  CAUTION  OF  SEDATION Patient taking differently: Take 25 mg by mouth every 8 (eight) hours as needed for nausea or vomiting.  06/08/19   Tower, Audrie Gallus, MD  lisinopril (ZESTRIL) 10 MG tablet Take 2 tablets  by mouth once daily 08/24/19   Tower, Audrie Gallus, MD  methocarbamol (ROBAXIN) 500 MG tablet Take 1 tablet (500 mg total) by mouth 2 (two) times daily. 10/20/19   McDonald, Mia A, PA-C  metoprolol succinate (TOPROL-XL) 100 MG 24 hr tablet Take 1 tablet (100 mg total) by mouth daily. Take with or immediately following a meal. 07/04/19 10/02/19  Rosalio Macadamia, NP  mirtazapine (REMERON) 15 MG tablet TAKE 1 TABLET BY MOUTH AT BEDTIME Patient taking differently: Take 15 mg by mouth at bedtime as needed (sleep).  05/02/19   Tower, Audrie Gallus, MD  naltrexone (DEPADE) 50 MG tablet Take 1 tablet (50 mg total) by mouth daily. Patient not taking: Reported on 08/26/2019 04/29/19   Tower, Audrie Gallus, MD  norethindrone (AYGESTIN) 5 MG  tablet Take 5 mg by mouth daily. 08/08/19   [provider]  omeprazole (PRILOSEC) 20 MG capsule Take 1 capsule by mouth once daily 10/28/19   Tower, [place] A, MD  sertraline (ZOLOFT) 50 MG tablet Take 100 mg by mouth daily.    [provider]  sucralfate (CARAFATE) 1 GM/10ML suspension Take 10 mLs (1 g total) by mouth 4 (four) times daily. 06/21/19   Benancio Deeds, MD    Physical Exam: Vitals:   11/27/19 1001 11/27/19 1138 11/27/19 1200 11/27/19 1215  BP: (!) 185/109 (!) 167/122 (!) 173/113   Pulse: (!) 155 (!) 131 (!) 111   Resp: 20 14 19 19   Temp: 98.3 F (36.8 C)     TempSrc: Oral     SpO2: 100% 100% 99%     General: 49 y.o. female resting in bed. Eyes: PERRL, normal sclera ENMT: Nares patent w/o discharge, orophaynx clear, dentition normal, ears w/o discharge/lesions/ulcers Neck: Supple, trachea midline Cardiovascular: tachy, +S1, S2, no m/g/r, equal pulses throughout Respiratory: CTABL, no w/r/r, normal WOB GI: BS+, NDNT, no masses noted, no organomegaly noted MSK: No e/c/c Skin: No rashes, bruises, ulcerations noted Neuro: A&O x 3, no focal deficits Psyc: Anxious but cooperative  Labs on Admission: I have personally reviewed following labs and imaging studies  CBC: Recent Labs  Lab 11/27/19 1050  WBC 8.3  HGB 11.1*  HCT 36.3  MCV 80.1  PLT 302   Basic Metabolic Panel: Recent Labs  Lab 11/27/19 1050 11/27/19 1226  NA 138  --   K 2.9*  --   CL 98  --   CO2 20*  --   GLUCOSE 150*  --   BUN 14  --   CREATININE 2.31*  --   CALCIUM 9.7  --   MG  --  1.2*   GFR: CrCl cannot be calculated (Unknown ideal weight.). Liver Function Tests: Recent Labs  Lab 11/27/19 1050  AST 126*  ALT 42  ALKPHOS 53  BILITOT 1.0  PROT 9.8*  ALBUMIN 5.9*   Recent Labs  Lab 11/27/19 1050  LIPASE 132*   No results for input(s): AMMONIA in the last 168 hours. Coagulation Profile: No results for input(s): INR, PROTIME in the last 168  hours. Cardiac Enzymes: No results for input(s): CKTOTAL, CKMB, CKMBINDEX, TROPONINI in the last 168 hours. BNP (last 3 results) No results for input(s): PROBNP in the last 8760 hours. HbA1C: No results for input(s): HGBA1C in the last 72 hours. CBG: No results for input(s): GLUCAP in the last 168 hours. Lipid Profile: No results for input(s): CHOL, HDL, LDLCALC, TRIG, CHOLHDL, LDLDIRECT in the last 72 hours. Thyroid Function Tests: No results for input(s): TSH,  T4TOTAL, FREET4, T3FREE, THYROIDAB in the last 72 hours. Anemia Panel: No results for input(s): VITAMINB12, FOLATE, FERRITIN, TIBC, IRON, RETICCTPCT in the last 72 hours. Urine analysis:    Component Value Date/Time   COLORURINE AMBER (A) 11/27/2019 1012   APPEARANCEUR CLOUDY (A) 11/27/2019 1012   LABSPEC 1.024 11/27/2019 1012   PHURINE 5.0 11/27/2019 1012   GLUCOSEU NEGATIVE 11/27/2019 1012   HGBUR SMALL (A) 11/27/2019 1012   BILIRUBINUR NEGATIVE 11/27/2019 1012   BILIRUBINUR trace 12/20/2012 1553   KETONESUR 5 (A) 11/27/2019 1012   PROTEINUR >=300 (A) 11/27/2019 1012   UROBILINOGEN 0.2 06/22/2014 1147   NITRITE NEGATIVE 11/27/2019 1012   LEUKOCYTESUR LARGE (A) 11/27/2019 1012    Radiological Exams on Admission: No results found.  Assessment/Plan EtOH withdrawal     - recommend admission to progressive care floor w/ CIWAA protocol, thiamine, fluids     - I have discussed this with the patient and she states she wants to go home. She is A&O x 3. When I ask her if she understands the possible consequences of leaving now while she is in withdrawal, she states that she knows that anything can happen including death, but she needs to go home and take care of her father. I asked her again about consequences and she states flatly that she understands she could die.   Hypokalemia Hypomagnesemia     - replacement started in ED; would continue but patient is leaving AMA  AKI Proteinuria     - Scr is up     - fluids  started in ED; patient is leaving AMA   DVT prophylaxis: None  Code Status: FULL  Family Communication: None   Status is: Observation; patient is leaving AMA  Teddy Spike DO [facility]  If 7PM-7AM, please contact night-coverage [email or web address]  11/27/2019, 1:26 PM

## 2019-12-24 ENCOUNTER — Encounter (HOSPITAL_COMMUNITY): Payer: Self-pay | Admitting: Internal Medicine

## 2019-12-24 ENCOUNTER — Other Ambulatory Visit: Payer: Self-pay

## 2019-12-24 ENCOUNTER — Observation Stay (HOSPITAL_COMMUNITY): Payer: 59

## 2019-12-24 ENCOUNTER — Inpatient Hospital Stay (HOSPITAL_COMMUNITY)
Admission: EM | Admit: 2019-12-24 | Discharge: 2019-12-26 | DRG: 683 | Disposition: A | Discharge status: Home / Self Care | Payer: 59 | Attending: Internal Medicine | Admitting: Internal Medicine

## 2019-12-24 DIAGNOSIS — Z8249 Family history of ischemic heart disease and other diseases of the circulatory system: Secondary | ICD-10-CM

## 2019-12-24 DIAGNOSIS — F1010 Alcohol abuse, uncomplicated: Secondary | ICD-10-CM | POA: Diagnosis not present

## 2019-12-24 DIAGNOSIS — Z888 Allergy status to other drugs, medicaments and biological substances status: Secondary | ICD-10-CM

## 2019-12-24 DIAGNOSIS — N179 Acute kidney failure, unspecified: Principal | ICD-10-CM | POA: Diagnosis present

## 2019-12-24 DIAGNOSIS — K8020 Calculus of gallbladder without cholecystitis without obstruction: Secondary | ICD-10-CM | POA: Diagnosis present

## 2019-12-24 DIAGNOSIS — Z23 Encounter for immunization: Secondary | ICD-10-CM

## 2019-12-24 DIAGNOSIS — K859 Acute pancreatitis without necrosis or infection, unspecified: Secondary | ICD-10-CM

## 2019-12-24 DIAGNOSIS — G8929 Other chronic pain: Secondary | ICD-10-CM | POA: Diagnosis present

## 2019-12-24 DIAGNOSIS — Z801 Family history of malignant neoplasm of trachea, bronchus and lung: Secondary | ICD-10-CM

## 2019-12-24 DIAGNOSIS — E872 Acidosis: Secondary | ICD-10-CM | POA: Diagnosis present

## 2019-12-24 DIAGNOSIS — K861 Other chronic pancreatitis: Secondary | ICD-10-CM | POA: Diagnosis present

## 2019-12-24 DIAGNOSIS — Z20822 Contact with and (suspected) exposure to covid-19: Secondary | ICD-10-CM | POA: Diagnosis present

## 2019-12-24 DIAGNOSIS — I10 Essential (primary) hypertension: Secondary | ICD-10-CM

## 2019-12-24 DIAGNOSIS — K7010 Alcoholic hepatitis without ascites: Secondary | ICD-10-CM

## 2019-12-24 DIAGNOSIS — R109 Unspecified abdominal pain: Secondary | ICD-10-CM

## 2019-12-24 DIAGNOSIS — Z79899 Other long term (current) drug therapy: Secondary | ICD-10-CM

## 2019-12-24 DIAGNOSIS — F419 Anxiety disorder, unspecified: Secondary | ICD-10-CM | POA: Diagnosis present

## 2019-12-24 DIAGNOSIS — Z86711 Personal history of pulmonary embolism: Secondary | ICD-10-CM

## 2019-12-24 DIAGNOSIS — I429 Cardiomyopathy, unspecified: Secondary | ICD-10-CM | POA: Diagnosis present

## 2019-12-24 DIAGNOSIS — F32A Depression, unspecified: Secondary | ICD-10-CM | POA: Diagnosis present

## 2019-12-24 DIAGNOSIS — G43909 Migraine, unspecified, not intractable, without status migrainosus: Secondary | ICD-10-CM | POA: Diagnosis present

## 2019-12-24 DIAGNOSIS — K219 Gastro-esophageal reflux disease without esophagitis: Secondary | ICD-10-CM | POA: Diagnosis present

## 2019-12-24 DIAGNOSIS — F10239 Alcohol dependence with withdrawal, unspecified: Secondary | ICD-10-CM | POA: Diagnosis present

## 2019-12-24 DIAGNOSIS — M545 Low back pain, unspecified: Secondary | ICD-10-CM | POA: Diagnosis present

## 2019-12-24 LAB — CBC
HCT: 27.8 % — ABNORMAL LOW (ref 36.0–46.0)
Hemoglobin: 8.7 g/dL — ABNORMAL LOW (ref 12.0–15.0)
MCH: 24.6 pg — ABNORMAL LOW (ref 26.0–34.0)
MCHC: 31.3 g/dL (ref 30.0–36.0)
MCV: 78.5 fL — ABNORMAL LOW (ref 80.0–100.0)
Platelets: 278 10*3/uL (ref 150–400)
RBC: 3.54 MIL/uL — ABNORMAL LOW (ref 3.87–5.11)
RDW: 20.5 % — ABNORMAL HIGH (ref 11.5–15.5)
WBC: 8.6 10*3/uL (ref 4.0–10.5)
nRBC: 0 % (ref 0.0–0.2)

## 2019-12-24 LAB — RAPID URINE DRUG SCREEN, HOSP PERFORMED
Amphetamines: NOT DETECTED
Barbiturates: NOT DETECTED
Benzodiazepines: NOT DETECTED
Cocaine: NOT DETECTED
Opiates: NOT DETECTED
Tetrahydrocannabinol: POSITIVE — AB

## 2019-12-24 LAB — COMPREHENSIVE METABOLIC PANEL
ALT: 114 U/L — ABNORMAL HIGH (ref 0–44)
ALT: 90 U/L — ABNORMAL HIGH (ref 0–44)
AST: 134 U/L — ABNORMAL HIGH (ref 15–41)
AST: 88 U/L — ABNORMAL HIGH (ref 15–41)
Albumin: 5.2 g/dL — ABNORMAL HIGH (ref 3.5–5.0)
Albumin: 4.1 g/dL (ref 3.5–5.0)
Alkaline Phosphatase: 51 U/L (ref 38–126)
Alkaline Phosphatase: 40 U/L (ref 38–126)
Anion gap: 22 — ABNORMAL HIGH (ref 5–15)
Anion gap: 13 (ref 5–15)
BUN: 31 mg/dL — ABNORMAL HIGH (ref 6–20)
BUN: 26 mg/dL — ABNORMAL HIGH (ref 6–20)
CO2: 21 mmol/L — ABNORMAL LOW (ref 22–32)
CO2: 24 mmol/L (ref 22–32)
Calcium: 9.4 mg/dL (ref 8.9–10.3)
Calcium: 8 mg/dL — ABNORMAL LOW (ref 8.9–10.3)
Chloride: 92 mmol/L — ABNORMAL LOW (ref 98–111)
Chloride: 99 mmol/L (ref 98–111)
Creatinine, Ser: 2.7 mg/dL — ABNORMAL HIGH (ref 0.44–1.00)
Creatinine, Ser: 1.83 mg/dL — ABNORMAL HIGH (ref 0.44–1.00)
GFR, Estimated: 21 mL/min — ABNORMAL LOW (ref >60)
GFR, Estimated: 34 mL/min — ABNORMAL LOW (ref >60)
Glucose, Bld: 157 mg/dL — ABNORMAL HIGH (ref 70–99)
Glucose, Bld: 102 mg/dL — ABNORMAL HIGH (ref 70–99)
Potassium: 4.6 mmol/L (ref 3.5–5.1)
Potassium: 3.7 mmol/L (ref 3.5–5.1)
Sodium: 135 mmol/L (ref 135–145)
Sodium: 136 mmol/L (ref 135–145)
Total Bilirubin: 0.7 mg/dL (ref 0.3–1.2)
Total Bilirubin: 0.5 mg/dL (ref 0.3–1.2)
Total Protein: 9.1 g/dL — ABNORMAL HIGH (ref 6.5–8.1)
Total Protein: 7.1 g/dL (ref 6.5–8.1)

## 2019-12-24 LAB — CBC WITH DIFFERENTIAL/PLATELET
Abs Immature Granulocytes: 0.05 10*3/uL (ref 0.00–0.07)
Basophils Absolute: 0 10*3/uL (ref 0.0–0.1)
Basophils Relative: 0 %
Eosinophils Absolute: 0 10*3/uL (ref 0.0–0.5)
Eosinophils Relative: 0 %
HCT: 32.3 % — ABNORMAL LOW (ref 36.0–46.0)
Hemoglobin: 10.5 g/dL — ABNORMAL LOW (ref 12.0–15.0)
Immature Granulocytes: 1 %
Lymphocytes Relative: 6 %
Lymphs Abs: 0.6 10*3/uL — ABNORMAL LOW (ref 0.7–4.0)
MCH: 25.1 pg — ABNORMAL LOW (ref 26.0–34.0)
MCHC: 32.5 g/dL (ref 30.0–36.0)
MCV: 77.3 fL — ABNORMAL LOW (ref 80.0–100.0)
Monocytes Absolute: 1.2 10*3/uL — ABNORMAL HIGH (ref 0.1–1.0)
Monocytes Relative: 12 %
Neutro Abs: 8.3 10*3/uL — ABNORMAL HIGH (ref 1.7–7.7)
Neutrophils Relative %: 81 %
Platelets: 360 10*3/uL (ref 150–400)
RBC: 4.18 MIL/uL (ref 3.87–5.11)
RDW: 20.7 % — ABNORMAL HIGH (ref 11.5–15.5)
WBC: 10.1 10*3/uL (ref 4.0–10.5)
nRBC: 0 % (ref 0.0–0.2)

## 2019-12-24 LAB — PHOSPHORUS: Phosphorus: 3.1 mg/dL (ref 2.5–4.6)

## 2019-12-24 LAB — MAGNESIUM
Magnesium: 1.5 mg/dL — ABNORMAL LOW (ref 1.7–2.4)
Magnesium: 2.4 mg/dL (ref 1.7–2.4)

## 2019-12-24 LAB — RESP PANEL BY RT-PCR (FLU A&B, COVID) ARPGX2
Influenza A by PCR: NEGATIVE
Influenza B by PCR: NEGATIVE
SARS Coronavirus 2 by RT PCR: NEGATIVE

## 2019-12-24 LAB — LACTIC ACID, PLASMA: Lactic Acid, Venous: 0.9 mmol/L (ref 0.5–1.9)

## 2019-12-24 LAB — ETHANOL: Alcohol, Ethyl (B): <10 mg/dL (ref <10)

## 2019-12-24 LAB — I-STAT BETA HCG BLOOD, ED (MC, WL, AP ONLY): I-stat hCG, quantitative: <5 m[IU]/mL (ref <5)

## 2019-12-24 LAB — LIPASE, BLOOD: Lipase: 84 U/L — ABNORMAL HIGH (ref 11–51)

## 2019-12-24 LAB — BETA-HYDROXYBUTYRIC ACID: Beta-Hydroxybutyric Acid: 0.71 mmol/L — ABNORMAL HIGH (ref 0.05–0.27)

## 2019-12-24 LAB — HIV ANTIBODY (ROUTINE TESTING W REFLEX): HIV Screen 4th Generation wRfx: NONREACTIVE

## 2019-12-24 MED ORDER — PANTOPRAZOLE SODIUM 40 MG PO TBEC
40.0000 mg | DELAYED_RELEASE_TABLET | Freq: Every day | ORAL | Status: DC
  Administered 2019-12-24 – 2019-12-26 (×3): 40 mg via ORAL
  Filled 2019-12-24 (×3): qty 1

## 2019-12-24 MED ORDER — MAGNESIUM SULFATE 2 GM/50ML IV SOLN
2.0000 g | Freq: Once | INTRAVENOUS | Status: AC
  Administered 2019-12-24: 2 g via INTRAVENOUS
  Filled 2019-12-24: qty 50

## 2019-12-24 MED ORDER — THIAMINE HCL 100 MG PO TABS
100.0000 mg | ORAL_TABLET | Freq: Every day | ORAL | Status: DC
  Administered 2019-12-24 – 2019-12-26 (×3): 100 mg via ORAL
  Filled 2019-12-24 (×4): qty 1

## 2019-12-24 MED ORDER — ONDANSETRON HCL 4 MG PO TABS: 4.0000 mg | ORAL_TABLET | Freq: Four times a day (QID) | ORAL | Status: DC | PRN

## 2019-12-24 MED ORDER — AMLODIPINE BESYLATE 5 MG PO TABS
10.0000 mg | ORAL_TABLET | Freq: Every day | ORAL | Status: DC
  Administered 2019-12-24 – 2019-12-26 (×3): 10 mg via ORAL
  Filled 2019-12-24 (×3): qty 2

## 2019-12-24 MED ORDER — METOPROLOL SUCCINATE ER 50 MG PO TB24
100.0000 mg | ORAL_TABLET | Freq: Every day | ORAL | Status: DC
  Administered 2019-12-24 – 2019-12-26 (×3): 100 mg via ORAL
  Filled 2019-12-24 (×3): qty 2

## 2019-12-24 MED ORDER — ONDANSETRON HCL 4 MG/2ML IJ SOLN: 4.0000 mg | Freq: Four times a day (QID) | INTRAMUSCULAR | Status: DC | PRN

## 2019-12-24 MED ORDER — MORPHINE SULFATE (PF) 2 MG/ML IV SOLN: 2.0000 mg | INTRAVENOUS | Status: DC | PRN

## 2019-12-24 MED ORDER — THIAMINE HCL 100 MG/ML IJ SOLN: 100.0000 mg | Freq: Every day | INTRAMUSCULAR | Status: DC

## 2019-12-24 MED ORDER — ACETAMINOPHEN 650 MG RE SUPP: 650.0000 mg | Freq: Four times a day (QID) | RECTAL | Status: DC | PRN

## 2019-12-24 MED ORDER — ADULT MULTIVITAMIN W/MINERALS CH
1.0000 | ORAL_TABLET | Freq: Every day | ORAL | Status: DC
  Administered 2019-12-25 – 2019-12-26 (×2): 1 via ORAL
  Filled 2019-12-24 (×2): qty 1

## 2019-12-24 MED ORDER — LORAZEPAM 2 MG/ML IJ SOLN: 0.0000 mg | Freq: Two times a day (BID) | INTRAMUSCULAR | Status: DC

## 2019-12-24 MED ORDER — SERTRALINE HCL 50 MG PO TABS
100.0000 mg | ORAL_TABLET | Freq: Every day | ORAL | Status: DC
  Administered 2019-12-24 – 2019-12-26 (×3): 100 mg via ORAL
  Filled 2019-12-24 (×3): qty 2

## 2019-12-24 MED ORDER — LORAZEPAM 1 MG PO TABS: 0.0000 mg | ORAL_TABLET | Freq: Two times a day (BID) | ORAL | Status: DC

## 2019-12-24 MED ORDER — LORAZEPAM 1 MG PO TABS
0.0000 mg | ORAL_TABLET | Freq: Four times a day (QID) | ORAL | Status: AC
  Administered 2019-12-24 – 2019-12-26 (×6): 2 mg via ORAL
  Filled 2019-12-24 (×6): qty 2

## 2019-12-24 MED ORDER — POLYETHYLENE GLYCOL 3350 17 G PO PACK: 17.0000 g | PACK | Freq: Every day | ORAL | Status: DC | PRN

## 2019-12-24 MED ORDER — HEPARIN SODIUM (PORCINE) 5000 UNIT/ML IJ SOLN
5000.0000 [IU] | Freq: Three times a day (TID) | INTRAMUSCULAR | Status: DC
  Administered 2019-12-24 – 2019-12-26 (×5): 5000 [IU] via SUBCUTANEOUS
  Filled 2019-12-24 (×4): qty 1

## 2019-12-24 MED ORDER — SODIUM CHLORIDE 0.9 % IV SOLN: INTRAVENOUS | Status: AC

## 2019-12-24 MED ORDER — FOLIC ACID 1 MG PO TABS
1.0000 mg | ORAL_TABLET | Freq: Every day | ORAL | Status: DC
  Administered 2019-12-24 – 2019-12-26 (×3): 1 mg via ORAL
  Filled 2019-12-24 (×3): qty 1

## 2019-12-24 MED ORDER — LORAZEPAM 2 MG/ML IJ SOLN
0.0000 mg | Freq: Four times a day (QID) | INTRAMUSCULAR | Status: AC
  Administered 2019-12-24: 2 mg via INTRAVENOUS
  Filled 2019-12-24: qty 1

## 2019-12-24 MED ORDER — METOCLOPRAMIDE HCL 5 MG/ML IJ SOLN
5.0000 mg | Freq: Once | INTRAMUSCULAR | Status: AC
  Administered 2019-12-24: 5 mg via INTRAVENOUS
  Filled 2019-12-24: qty 2

## 2019-12-24 MED ORDER — SODIUM CHLORIDE 0.9 % IV SOLN: INTRAVENOUS | Status: DC

## 2019-12-24 MED ORDER — HYDROCODONE-ACETAMINOPHEN 5-325 MG PO TABS: 1.0000 | ORAL_TABLET | ORAL | Status: DC | PRN

## 2019-12-24 MED ORDER — LORAZEPAM 2 MG/ML IJ SOLN
2.0000 mg | Freq: Once | INTRAMUSCULAR | Status: AC
  Administered 2019-12-24: 2 mg via INTRAVENOUS
  Filled 2019-12-24: qty 1

## 2019-12-24 MED ORDER — ACETAMINOPHEN 325 MG PO TABS
650.0000 mg | ORAL_TABLET | Freq: Four times a day (QID) | ORAL | Status: DC | PRN
  Administered 2019-12-25: 650 mg via ORAL
  Filled 2019-12-24: qty 2

## 2019-12-24 MED ORDER — INFLUENZA VAC SPLIT QUAD 0.5 ML IM SUSY
0.5000 mL | PREFILLED_SYRINGE | INTRAMUSCULAR | Status: AC
  Administered 2019-12-26: 0.5 mL via INTRAMUSCULAR
  Filled 2019-12-24: qty 0.5

## 2019-12-24 MED ORDER — MIRTAZAPINE 7.5 MG PO TABS
15.0000 mg | ORAL_TABLET | Freq: Every evening | ORAL | Status: DC | PRN
Start: 2019-12-24 — End: 2019-12-26
  Administered 2019-12-25: 15 mg via ORAL
  Filled 2019-12-24 (×2): qty 2

## 2019-12-24 MED ORDER — SODIUM CHLORIDE 0.9 % IV BOLUS
2000.0000 mL | Freq: Once | INTRAVENOUS | Status: AC
  Administered 2019-12-24: 2000 mL via INTRAVENOUS

## 2019-12-24 NOTE — ED Notes (Signed)
Lab states they did not get the ETOH tube, RN will draw this again and take it to them.

## 2019-12-24 NOTE — ED Notes (Signed)
Report called to RN Cindy.

## 2019-12-24 NOTE — ED Notes (Signed)
Pt ambulatory to bathroom, no assistance needed.  

## 2019-12-24 NOTE — ED Provider Notes (Signed)
[facility]-EMERGENCY DEPT Provider Note   CSN: [id number] Arrival date & time: 12/24/19  1244     History No chief complaint on file.   Nicole Hughes is a 49 y.o. female.  49 year old female with history of alcohol abuse who presents with nausea vomiting since yesterday.  Patient states that she normally drinks a pint of liquor a day and over the last 2 weeks has been trying to self detox himself.  Had an admission for similar symptoms but did not complete her course of therapy.  States her emesis has been nonbilious or bloody.  Denies any diarrhea.  No black or bloody stools.  Denies any drugs of abuse.  Denies any severe abdominal discomfort.  Has had some diffuse muscle crampiness.  States that she cannot keep anything down.        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   GERD (gastroesophageal reflux disease)    Hay fever    "fall and spring" (02/17/2017)   Headache    "2-3 times/.wik" (02/17/2017)   History of chicken pox    Hypertension    Migraine    "monthly" (02/17/2017)   Pelvic pain    Pulmonary embolism (HCC) 08/1998   S/P childbirth    Patient Active Problem List   Diagnosis Date Noted   Intractable nausea and vomiting 11/27/2019   Alcohol withdrawal (HCC) 11/27/2019   Dysphagia 05/31/2019   Insect bite 06/21/2018   Pedal edema 03/31/2018   Anemia 03/31/2018   Appetite loss 03/08/2018   Myofascial pain 03/08/2018   IBS (irritable bowel syndrome) 03/08/2018   Alcoholic hepatitis without ascites 03/05/2018   Elevated LFTs 03/03/2018   Achilles tendonitis 01/28/2018   Abnormal cervical Papanicolaou smear 09/29/2017    Insomnia 09/29/2017   Pain 09/29/2017   Palpitations 05/25/2017   Chest wall pain 05/25/2017   Elevated transaminase level 05/25/2017   Depression with anxiety 02/23/2017   MRSA carrier 02/23/2017   Hyponatremia 02/17/2017   Hypokalemia 09/24/2016   Alcohol abuse 09/22/2016   H/O acute pancreatitis 09/22/2016   GERD (gastroesophageal reflux disease) 04/09/2015   Epigastric pain 01/17/2014   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     Family History  Problem Relation Age of Onset   Cancer - Lung Mother    Heart failure Father    Hypertension Father     Social History   Tobacco Use   Smoking status: Never Smoker  Smokeless tobacco: Never Used  Vaping Use   Vaping Use: Never used  Substance Use Topics   Alcohol use: Yes    Alcohol/week: 5.0 standard drinks    Types: 5 Cans of beer per week   Drug use: Yes    Types: Marijuana    Home Medications Prior to Admission medications   Medication Sig Start Date End Date Taking? Authorizing Provider  amLODipine (NORVASC) 10 MG tablet Take 1 tablet by mouth once daily 06/07/19   Tower, Audrie Gallus, MD  etodolac (LODINE) 300 MG capsule Take 1 capsule (300 mg total) by mouth 3 (three) times daily. Patient not taking: Reported on 11/27/2019 08/26/19   Linwood Dibbles, MD  hydrochlorothiazide (MICROZIDE) 12.5 MG  capsule TAKE 1 CAPSULE BY MOUTH  DAILY 10/03/19   Rosalio Macadamia, NP  hydrOXYzine (ATARAX/VISTARIL) 25 MG tablet TAKE 1 TABLET BY MOUTH EVERY 8 HOURS AS NEEDED FOR ANXIETY OR  NAUSEA.  CAUTION  OF  SEDATION Patient taking differently: Take 25 mg by mouth every 8 (eight) hours as needed for nausea or vomiting.  06/08/19   Tower, Audrie Gallus, MD  lisinopril (ZESTRIL) 10 MG tablet Take 2 tablets by mouth once daily Patient not taking: Reported on 11/27/2019 08/24/19   Tower, Audrie Gallus, MD  methocarbamol (ROBAXIN) 500 MG tablet Take 1 tablet (500 mg total) by mouth 2 (two) times daily. Patient not taking: Reported on 11/27/2019 10/20/19   McDonald, Pedro Earls A, PA-C  metoprolol succinate (TOPROL-XL) 100 MG 24 hr tablet Take 1 tablet (100 mg total) by mouth daily. Take with or immediately following a meal. 07/04/19 11/27/19  Rosalio Macadamia, NP  mirtazapine (REMERON) 15 MG tablet TAKE 1 TABLET BY MOUTH AT BEDTIME Patient taking differently: Take 15 mg by mouth at bedtime as needed (sleep).  05/02/19   Tower, Audrie Gallus, MD  naltrexone (DEPADE) 50 MG tablet Take 1 tablet (50 mg total) by mouth daily. Patient not taking: Reported on 11/27/2019 04/29/19   Tower, Audrie Gallus, MD  omeprazole (PRILOSEC) 20 MG capsule Take 1 capsule by mouth once daily 10/28/19   Tower, Audrie Gallus, MD  sertraline (ZOLOFT) 50 MG tablet Take 100 mg by mouth daily.    [provider]  sucralfate (CARAFATE) 1 GM/10ML suspension Take 10 mLs (1 g total) by mouth 4 (four) times daily. Patient taking differently: Take 1 g by mouth 4 (four) times daily as needed (For constipation).  06/21/19   Armbruster, Willaim Rayas, MD    Allergies    Lisinopril  Review of Systems   Review of Systems  All other systems reviewed and are negative.   Physical Exam Updated Vital Signs There were no vitals taken for this visit.  Physical Exam Vitals and nursing note reviewed.  Constitutional:      General: She is not in acute distress.    Appearance: Normal  appearance. She is well-developed and well-nourished. She is not toxic-appearing.  HENT:     Head: Normocephalic and atraumatic.  Eyes:     General: Lids are normal.     Extraocular Movements: EOM normal.     Conjunctiva/sclera: Conjunctivae normal.     Pupils: Pupils are equal, round, and reactive to light.  Neck:     Thyroid: No thyroid mass.     Trachea: No tracheal deviation.  Cardiovascular:     Rate and Rhythm: Regular rhythm. Tachycardia present.     Heart sounds: Normal heart sounds. No murmur heard. No gallop.   Pulmonary:  Effort: Pulmonary effort is normal. No respiratory distress.     Breath sounds: Normal breath sounds. No stridor. No decreased breath sounds, wheezing, rhonchi or rales.  Abdominal:     General: Bowel sounds are normal. There is no distension.     Palpations: Abdomen is soft.     Tenderness: There is no abdominal tenderness. There is no CVA tenderness or rebound.  Musculoskeletal:        General: No tenderness or edema. Normal range of motion.     Cervical back: Normal range of motion and neck supple.  Skin:    General: Skin is warm and dry.     Findings: No abrasion or rash.  Neurological:     Mental Status: She is alert and oriented to person, place, and time.     GCS: GCS eye subscore is 4. GCS verbal subscore is 5. GCS motor subscore is 6.     Cranial Nerves: No cranial nerve deficit.     Sensory: No sensory deficit.     Motor: Tremor present.     Deep Tendon Reflexes: Strength normal.  Psychiatric:        Attention and Perception: Attention normal.        Mood and Affect: Mood is anxious.        Speech: Speech is rapid and pressured.        Behavior: Behavior is hyperactive.     ED Results / Procedures / Treatments   Labs (all labs ordered are listed, but only abnormal results are displayed) Labs Reviewed  RESP PANEL BY RT-PCR (FLU A&B, COVID) ARPGX2  ETHANOL  CBC WITH DIFFERENTIAL/PLATELET  COMPREHENSIVE METABOLIC PANEL  LIPASE,  BLOOD  MAGNESIUM  I-STAT BETA HCG BLOOD, ED ([facility], [facility], [facility] ONLY)    EKG EKG Interpretation  Date/Time:  Saturday December 24 2019 14:05:26 EST Ventricular Rate:  114 PR Interval:  142 QRS Duration: 70 QT Interval:  348 QTC Calculation: 479 R Axis:   53 Text Interpretation: Sinus tachycardia Otherwise normal ECG Confirmed by Lorre Nick ([id number]) on 12/24/2019 2:36:25 PM   Radiology No results found.  Procedures Procedures (including critical care time)  Medications Ordered in ED Medications  sodium chloride 0.9 % bolus 2,000 mL (has no administration in time range)  0.9 %  sodium chloride infusion (has no administration in time range)  LORazepam (ATIVAN) injection 2 mg (has no administration in time range)  LORazepam (ATIVAN) injection 0-4 mg (has no administration in time range)    Or  LORazepam (ATIVAN) tablet 0-4 mg (has no administration in time range)  LORazepam (ATIVAN) injection 0-4 mg (has no administration in time range)    Or  LORazepam (ATIVAN) tablet 0-4 mg (has no administration in time range)  thiamine tablet 100 mg (has no administration in time range)    Or  thiamine (B-1) injection 100 mg (has no administration in time range)    ED Course  I have reviewed the triage vital signs and the nursing notes.  Pertinent labs & imaging results that were available during my care of the patient were reviewed by me and considered in my medical decision making (see chart for details).    MDM Rules/Calculators/A&P                          Review of patient's old chart shows that she left AMA from the hospital several weeks ago and at that time had been admitted for dehydration with  acute kidney injury.  Patient's kidney function here remains decreased.  Creatinine now is actually up to 2.7.  Patient's hypomagnesemia remains with a mag of 1.5.  Patient ordered 2 g of magnesium.  Has a mild transaminitis likely from her alcohol abuse.  Given Ativan for her tremors does  feel better.  Also given aggressive fluid resuscitation.  Patient is agreeable to staying in the hospital.  Will admit to the hospital service  CRITICAL CARE Performed by: Toy BakerAnthony T Devonne Lalani Total critical care time: 50 minutes Critical care time was exclusive of separately billable procedures and treating other patients. Critical care was necessary to treat or prevent imminent or life-threatening deterioration. Critical care was time spent personally by me on the following activities: development of treatment plan with patient and/or surrogate as well as nursing, discussions with consultants, evaluation of patient's response to treatment, examination of patient, obtaining history from patient or surrogate, ordering and performing treatments and interventions, ordering and review of laboratory studies, ordering and review of radiographic studies, pulse oximetry and re-evaluation of patient's condition.  Final Clinical Impression(s) / ED Diagnoses Final diagnoses:  None    Rx / DC Orders ED Discharge Orders    None       Lorre NickAllen, Yuritzy Zehring, MD 12/24/19 [phone]

## 2019-12-24 NOTE — ED Triage Notes (Signed)
EMS, pt from home trying to detox from [facility]. N/V, cramping. n/s 20LAC 4mg  zofran  150/94 P 127 R 22 99 RA 244 CBG 97.4 T

## 2019-12-24 NOTE — H&P (Signed)
History and Physical        Hospital Admission Note Date: 12/24/2019  Patient name: Nicole Hughes Medical record number: [id number] Date of birth: [birth year] Age: 49 y.o. Gender: female  PCP: Tower, Wynelle Fanny, MD    Chief Complaint    No chief complaint on file.     HPI:   This is a 49 year old female with past medical history of alcohol abuse, hypertension, anxiety, peripartum DVT and PE, who presents to the ED with intractable nausea and vomiting since yesterday.  She had a similar presentation about a month ago at which point she had left AMA.  States that she was in her usual state of health and started having intractable nausea vomiting last night and has been unable to keep any food down.  Has been titrating down her alcohol use as she is trying to quit, initially was drinking 1/5 of whiskey and now drinks a pint daily, last drink was yesterday afternoon.  She is also complaining of right flank pain for the same period of time without any dysuria.  States that she recently started HCTZ about a month ago.  No history of nephrolithiasis   ED Course: Afebrile, tachycardic, hemodynamically stable, on room air. Notable Labs: Sodium 135, K4.6, glucose 157, AG 22, BUN 31, creatinine 2.7, magnesium 1.5, alk phos 51, lipase 84, AST 134, ALT 114, WBC 10.1, Hb 10.5, UDS positive for THC, COVID-19 and flu negative. Patient received Ativan per CIWA protocol, magnesium sulfate and 2 L NS bolus.    Vitals:   12/24/19 1402 12/24/19 1437  BP: (!) 169/87 (!) 155/90  Pulse: (!) 118 (!) 103  Resp:  18  Temp:  98.2 F (36.8 C)  SpO2:  100%     Review of Systems:  Review of Systems  Genitourinary: Positive for flank pain. Negative for dysuria and hematuria.       Admits to oliguria  All other systems reviewed and are negative.   Medical/Social/Family History   Past Medical  History: Past Medical History:  Diagnosis Date  . Anxiety   . Cardiomyopathy ([place])   . Chronic lower back pain   . Complication of anesthesia    "hard to get under"  . Cyst of ovary   . Depression    hx  . DVT complicating pregnancy [id number]   RLE; RUE  . Dysmenorrhea   . Dyspareunia   . 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 GERD (gastroesophageal reflux disease)   . Hay fever    "fall and spring" (02/17/2017)  . Headache    "2-3 times/.wik" (02/17/2017)  . History of chicken pox   . Hypertension   . Migraine    "monthly" (02/17/2017)  . Pelvic pain   . Pulmonary embolism ([place]) 08/1998   S/P childbirth    Past Surgical History:  Procedure Laterality Date  . CESAREAN SECTION  1994  . ECTOPIC PREGNANCY SURGERY  2010  . LAPAROSCOPY N/A 09/24/2012   Procedure: OPERATIVE LAPROSCOPY WITH LYSIS OF ADHESIONS;  Surgeon: Thornell Sartorius, MD;  Location: [facility];  Service: Gynecology;  Laterality: N/A;  . TONSILLECTOMY  1984  . TUBAL LIGATION  2010    Medications: Prior to Admission medications  Medication Sig Start Date End Date Taking? Authorizing Provider  amLODipine (NORVASC) 10 MG tablet Take 1 tablet by mouth once daily Patient taking differently: Take 10 mg by mouth daily. 06/07/19   Tower, Wynelle Fanny, MD  etodolac (LODINE) 300 MG capsule Take 1 capsule (300 mg total) by mouth 3 (three) times daily. Patient not taking: Reported on 11/27/2019 08/26/19   Dorie Rank, MD  hydrochlorothiazide (MICROZIDE) 12.5 MG capsule TAKE 1 CAPSULE BY MOUTH  DAILY Patient taking differently: Take 12.5 mg by mouth daily. 10/03/19   Burtis Junes, NP  hydrOXYzine (ATARAX/VISTARIL) 25 MG tablet TAKE 1 TABLET BY MOUTH EVERY 8 HOURS AS NEEDED FOR ANXIETY OR  NAUSEA.  CAUTION  OF  SEDATION Patient taking differently: Take 25 mg by mouth every 8 (eight) hours as needed for nausea or vomiting.  06/08/19   Tower, Wynelle Fanny, MD  lisinopril (ZESTRIL) 10 MG tablet Take 2 tablets by mouth once daily Patient  not taking: Reported on 11/27/2019 08/24/19   Tower, Wynelle Fanny, MD  methocarbamol (ROBAXIN) 500 MG tablet Take 1 tablet (500 mg total) by mouth 2 (two) times daily. Patient not taking: Reported on 11/27/2019 10/20/19   McDonald, Maree Erie A, PA-C  metoprolol succinate (TOPROL-XL) 100 MG 24 hr tablet Take 1 tablet (100 mg total) by mouth daily. Take with or immediately following a meal. 07/04/19 11/27/19  Burtis Junes, NP  mirtazapine (REMERON) 15 MG tablet TAKE 1 TABLET BY MOUTH AT BEDTIME Patient taking differently: Take 15 mg by mouth at bedtime as needed (sleep).  05/02/19   Tower, Wynelle Fanny, MD  naltrexone (DEPADE) 50 MG tablet Take 1 tablet (50 mg total) by mouth daily. Patient not taking: Reported on 11/27/2019 04/29/19   Tower, Wynelle Fanny, MD  omeprazole (PRILOSEC) 20 MG capsule Take 1 capsule by mouth once daily Patient taking differently: Take 20 mg by mouth daily. 10/28/19   Tower, Wynelle Fanny, MD  sertraline (ZOLOFT) 50 MG tablet Take 100 mg by mouth daily.    [provider]  sucralfate (CARAFATE) 1 GM/10ML suspension Take 10 mLs (1 g total) by mouth 4 (four) times daily. Patient taking differently: Take 1 g by mouth 4 (four) times daily as needed (For constipation).  06/21/19   Armbruster, Carlota Raspberry, MD    Allergies:   Allergies  Allergen Reactions  . Lisinopril Cough    Social History:  reports that she has never smoked. She has never used smokeless tobacco. She reports current alcohol use of about 5.0 standard drinks of alcohol per week. She reports current drug use. Drug: Marijuana.  Family History: Family History  Problem Relation Age of Onset  . Cancer - Lung Mother   . Heart failure Father   . Hypertension Father      Objective   Physical Exam: Blood pressure (!) 155/90, pulse (!) 103, temperature 98.2 F (36.8 C), temperature source Oral, resp. rate 18, SpO2 100 %.  Physical Exam Vitals and nursing note reviewed.  Constitutional:      Appearance: She is  ill-appearing.  HENT:     Head: Normocephalic.     Mouth/Throat:     Mouth: Mucous membranes are moist.  Eyes:     Conjunctiva/sclera: Conjunctivae normal.  Cardiovascular:     Rate and Rhythm: Regular rhythm. Tachycardia present.  Pulmonary:     Effort: Pulmonary effort is normal.     Breath sounds: Normal breath sounds.  Abdominal:     General: Abdomen is flat.  Palpations: Abdomen is soft.     Tenderness: There is right CVA tenderness. There is no left CVA tenderness.  Musculoskeletal:        General: No swelling or tenderness.     Right lower leg: No edema.     Left lower leg: No edema.  Neurological:     Mental Status: She is alert. Mental status is at baseline.  Psychiatric:        Mood and Affect: Mood normal.        Behavior: Behavior normal.     LABS on Admission: I have personally reviewed all the labs and imaging below    Basic Metabolic Panel: Recent Labs  Lab 12/24/19 1312  NA 135  K 4.6  CL 92*  CO2 21*  GLUCOSE 157*  BUN 31*  CREATININE 2.70*  CALCIUM 9.4  MG 1.5*   Liver Function Tests: Recent Labs  Lab 12/24/19 1312  AST 134*  ALT 114*  ALKPHOS 51  BILITOT 0.7  PROT 9.1*  ALBUMIN 5.2*   Recent Labs  Lab 12/24/19 1312  LIPASE 84*   No results for input(s): AMMONIA in the last 168 hours. CBC: Recent Labs  Lab 12/24/19 1312  WBC 10.1  NEUTROABS 8.3*  HGB 10.5*  HCT 32.3*  MCV 77.3*  PLT 360   Cardiac Enzymes: No results for input(s): CKTOTAL, CKMB, CKMBINDEX, TROPONINI in the last 168 hours. BNP: Invalid input(s): POCBNP CBG: No results for input(s): GLUCAP in the last 168 hours.  Radiological Exams on Admission:  No results found.    EKG: Sinus tachycardia   A & P   Principal Problem:   AKI (acute kidney injury) ([place]) Active Problems:   Hypertension   Right flank pain   Alcohol abuse   Pancreatitis   Alcoholic hepatitis without ascites   Hypomagnesemia   1. AKI likely secondary to intractable nausea  and vomiting and poor p.o. intake a. Creatinine 2.7, baseline 0.7 b. Continue IV fluids c. Zofran for nausea d. Hold nephrotoxic agents  2. Right flank pain a. Recently started HCTZ and with nausea and vomiting b. CT renal stone study  3. Alcohol withdrawal a. CIWA protocol b. TOC consult  4. Hypomagnesemia a. Replete  5. Anion gap metabolic acidosis a. Check beta hydroxybutyrate and lactic acid b. Continue IV fluids  6. Elevated LFTs likely alcoholic hepatitis a. Plan as above  7. Hypertension a. Continue home amlodipine and Toprol b. Holding HCTZ and lisinopril  8. Depression a. Continue Zoloft  9. Elevated Lipase a. 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 Disposition Plan: The appropriate patient status for this patient is OBSERVATION. Observation status is judged to be reasonable and necessary in order to provide the required intensity of service to ensure the patient's safety. The patient's presenting symptoms, physical exam findings, and initial radiographic and laboratory data in the context of their medical condition is felt to place them at decreased risk for further clinical deterioration. Furthermore, it is anticipated that the patient will be medically stable for discharge from the hospital within 2 midnights of admission. The following factors support the patient status of observation.   " The patient's presenting symptoms include nausea and vomiting and right flank pain. " The physical  exam findings include positive CVA tenderness. " The initial radiographic and laboratory data are elevated LFTs.    Status is: Observation  The patient remains OBS appropriate  and will d/c before 2 midnights.  Dispo: The patient is from: Home              Anticipated d/c is to: Home              Anticipated d/c date is: 1 day              Patient currently is not medically stable to d/c.      Consultants  . None  Procedures  . None  Time Spent on Admission: 65 minutes    Harold Hedge, DO [facility]  12/24/2019, 4:10 PM

## 2019-12-25 DIAGNOSIS — Z79899 Other long term (current) drug therapy: Secondary | ICD-10-CM | POA: Diagnosis not present

## 2019-12-25 DIAGNOSIS — N179 Acute kidney failure, unspecified: Principal | ICD-10-CM

## 2019-12-25 DIAGNOSIS — Z801 Family history of malignant neoplasm of trachea, bronchus and lung: Secondary | ICD-10-CM | POA: Diagnosis not present

## 2019-12-25 DIAGNOSIS — F32A Depression, unspecified: Secondary | ICD-10-CM | POA: Diagnosis present

## 2019-12-25 DIAGNOSIS — E872 Acidosis: Secondary | ICD-10-CM | POA: Diagnosis present

## 2019-12-25 DIAGNOSIS — Z888 Allergy status to other drugs, medicaments and biological substances status: Secondary | ICD-10-CM | POA: Diagnosis not present

## 2019-12-25 DIAGNOSIS — K8020 Calculus of gallbladder without cholecystitis without obstruction: Secondary | ICD-10-CM | POA: Diagnosis present

## 2019-12-25 DIAGNOSIS — Z20822 Contact with and (suspected) exposure to covid-19: Secondary | ICD-10-CM | POA: Diagnosis present

## 2019-12-25 DIAGNOSIS — K219 Gastro-esophageal reflux disease without esophagitis: Secondary | ICD-10-CM | POA: Diagnosis present

## 2019-12-25 DIAGNOSIS — K7010 Alcoholic hepatitis without ascites: Secondary | ICD-10-CM | POA: Diagnosis present

## 2019-12-25 DIAGNOSIS — F419 Anxiety disorder, unspecified: Secondary | ICD-10-CM | POA: Diagnosis present

## 2019-12-25 DIAGNOSIS — G8929 Other chronic pain: Secondary | ICD-10-CM | POA: Diagnosis present

## 2019-12-25 DIAGNOSIS — K861 Other chronic pancreatitis: Secondary | ICD-10-CM | POA: Diagnosis present

## 2019-12-25 DIAGNOSIS — I429 Cardiomyopathy, unspecified: Secondary | ICD-10-CM | POA: Diagnosis present

## 2019-12-25 DIAGNOSIS — M545 Low back pain, unspecified: Secondary | ICD-10-CM | POA: Diagnosis present

## 2019-12-25 DIAGNOSIS — Z86711 Personal history of pulmonary embolism: Secondary | ICD-10-CM | POA: Diagnosis not present

## 2019-12-25 DIAGNOSIS — Z8249 Family history of ischemic heart disease and other diseases of the circulatory system: Secondary | ICD-10-CM | POA: Diagnosis not present

## 2019-12-25 DIAGNOSIS — G43909 Migraine, unspecified, not intractable, without status migrainosus: Secondary | ICD-10-CM | POA: Diagnosis present

## 2019-12-25 DIAGNOSIS — I10 Essential (primary) hypertension: Secondary | ICD-10-CM | POA: Diagnosis present

## 2019-12-25 DIAGNOSIS — Z23 Encounter for immunization: Secondary | ICD-10-CM | POA: Diagnosis not present

## 2019-12-25 DIAGNOSIS — F10239 Alcohol dependence with withdrawal, unspecified: Secondary | ICD-10-CM | POA: Diagnosis present

## 2019-12-25 LAB — CBC
HCT: 27.6 % — ABNORMAL LOW (ref 36.0–46.0)
Hemoglobin: 8.4 g/dL — ABNORMAL LOW (ref 12.0–15.0)
MCH: 24.7 pg — ABNORMAL LOW (ref 26.0–34.0)
MCHC: 30.4 g/dL (ref 30.0–36.0)
MCV: 81.2 fL (ref 80.0–100.0)
Platelets: 239 10*3/uL (ref 150–400)
RBC: 3.4 MIL/uL — ABNORMAL LOW (ref 3.87–5.11)
RDW: 20.6 % — ABNORMAL HIGH (ref 11.5–15.5)
WBC: 7.9 10*3/uL (ref 4.0–10.5)
nRBC: 0 % (ref 0.0–0.2)

## 2019-12-25 LAB — BASIC METABOLIC PANEL
Anion gap: 10 (ref 5–15)
BUN: 17 mg/dL (ref 6–20)
CO2: 24 mmol/L (ref 22–32)
Calcium: 8 mg/dL — ABNORMAL LOW (ref 8.9–10.3)
Chloride: 103 mmol/L (ref 98–111)
Creatinine, Ser: 1.03 mg/dL — ABNORMAL HIGH (ref 0.44–1.00)
GFR, Estimated: >60 mL/min (ref >60)
Glucose, Bld: 90 mg/dL (ref 70–99)
Potassium: 3.6 mmol/L (ref 3.5–5.1)
Sodium: 137 mmol/L (ref 135–145)

## 2019-12-25 LAB — MRSA PCR SCREENING: MRSA by PCR: NEGATIVE

## 2019-12-25 MED ORDER — SODIUM CHLORIDE 0.9 % IV SOLN: INTRAVENOUS | Status: DC

## 2019-12-25 NOTE — Progress Notes (Signed)
PROGRESS NOTE    Nicole Hughes  VZD:[id number] DOB: 06-11-1970 DOA: 12/24/2019 PCP: Judy Pimple, MD   Brief Narrative: 49 year old female with past medical history of alcohol abuse, hypertension, anxiety, peripartum DVT and PE, who presents to the ED with intractable nausea and vomiting since yesterday.  She had a similar presentation about a month ago at which point she had left AMA.  States that she was in her usual state of health and started having intractable nausea vomiting last night and has been unable to keep any food down.  Has been titrating down her alcohol use as she is trying to quit, initially was drinking 1/5 of whiskey and now drinks a pint daily, last drink was yesterday afternoon.  She is also complaining of right flank pain for the same period of time without any dysuria.  States that she recently started HCTZ about a month ago.  No history of nephrolithiasis  Assessment & Plan:   Principal Problem:   AKI (acute kidney injury) (HCC) Active Problems:   Hypertension   Right flank pain   Alcohol abuse   Pancreatitis   Alcoholic hepatitis without ascites   Hypomagnesemia   #1 alcohol withdrawal -patient still receiving IV Ativan.  Staff reports that she is very shaky and tachycardic. Continue IVF  #2 AKI improving with IV fluids.  Advance diet as tolerated.  #3 hypomagnesemia replete and recheck  #4 elevated LFTs secondary to alcoholic hepatitis likely follow-up labs in a.m.  #5 history of essential hypertension on amlodipine and Toprol and lisinopril and HCTZ at home.  Restarted Norvasc and Toprol.  Holding HCTZ and lisinopril.  #6 depression on Zoloft.  #7 chronic pancreatitis with elevated lipase patient admitted with intractable nausea and vomiting.  Continue IV fluids advance diet as tolerated.     Estimated body mass index is 26.45 kg/m as calculated from the following:   Height as of this encounter: 5\' 5"  (1.651 m).   Weight as of this  encounter: 72.1 kg.  DVT prophylaxis: Heparin  code Status: Full code Family Communication: None at bedside Disposition Plan:  Status is: Observation  Dispo: The patient is from: Home              Anticipated d/c is to: Home              Anticipated d/c date is: 2 days              Patient currently is not medically stable to d/c.    Consultants:   None  Procedures: None Antimicrobials: None  Subjective: Patient tachypneic tachycardic tremulous Continues with nausea  Objective: Vitals:   12/24/19 2158 12/25/19 0243 12/25/19 0604 12/25/19 1350  BP: 122/90 (!) 110/94 122/76 (!) 150/85  Pulse: 98 97 96 85  Resp: 16 18 18 15   Temp: 98.2 F (36.8 C) 98.3 F (36.8 C) 98.4 F (36.9 C) 98.3 F (36.8 C)  TempSrc: Oral Oral Oral Oral  SpO2: 98% 96% 98% 100%  Weight:      Height:        Intake/Output Summary (Last 24 hours) at 12/25/2019 1508 Last data filed at 12/25/2019 1300 Gross per 24 hour  Intake 2099.5 ml  Output --  Net 2099.5 ml   Filed Weights   12/24/19 1807  Weight: 72.1 kg    Examination:  General exam: Appears calm and comfortable  Respiratory system: Clear to auscultation. Respiratory effort normal. Cardiovascular system: S1 & S2 heard, RRR. No JVD, murmurs, rubs,  gallops or clicks. No pedal edema. Gastrointestinal system: Abdomen is nondistended, soft and nontender. No organomegaly or masses felt. Normal bowel sounds heard. Central nervous system: Alert and oriented. No focal neurological deficits. Extremities: Symmetric 5 x 5 power. Skin: No rashes, lesions or ulcers Psychiatry: Judgement and insight appear normal. Mood & affect appropriate.     Data Reviewed: I have personally reviewed following labs and imaging studies  CBC: Recent Labs  Lab 12/24/19 1312 12/24/19 1607 12/25/19 0623  WBC 10.1 8.6 7.9  NEUTROABS 8.3*  --   --   HGB 10.5* 8.7* 8.4*  HCT 32.3* 27.8* 27.6*  MCV 77.3* 78.5* 81.2  PLT 360 278 239   Basic Metabolic  Panel: Recent Labs  Lab 12/24/19 1312 12/24/19 1607 12/25/19 0623  NA 135 136 137  K 4.6 3.7 3.6  CL 92* 99 103  CO2 21* 24 24  GLUCOSE 157* 102* 90  BUN 31* 26* 17  CREATININE 2.70* 1.83* 1.03*  CALCIUM 9.4 8.0* 8.0*  MG 1.5* 2.4  --   PHOS  --  3.1  --    GFR: Estimated Creatinine Clearance: 67.2 mL/min (A) (by C-G formula based on SCr of 1.03 mg/dL (H)). Liver Function Tests: Recent Labs  Lab 12/24/19 1312 12/24/19 1607  AST 134* 88*  ALT 114* 90*  ALKPHOS 51 40  BILITOT 0.7 0.5  PROT 9.1* 7.1  ALBUMIN 5.2* 4.1   Recent Labs  Lab 12/24/19 1312  LIPASE 84*   No results for input(s): AMMONIA in the last 168 hours. Coagulation Profile: No results for input(s): INR, PROTIME in the last 168 hours. Cardiac Enzymes: No results for input(s): CKTOTAL, CKMB, CKMBINDEX, TROPONINI in the last 168 hours. BNP (last 3 results) No results for input(s): PROBNP in the last 8760 hours. HbA1C: No results for input(s): HGBA1C in the last 72 hours. CBG: No results for input(s): GLUCAP in the last 168 hours. Lipid Profile: No results for input(s): CHOL, HDL, LDLCALC, TRIG, CHOLHDL, LDLDIRECT in the last 72 hours. Thyroid Function Tests: No results for input(s): TSH, T4TOTAL, FREET4, T3FREE, THYROIDAB in the last 72 hours. Anemia Panel: No results for input(s): VITAMINB12, FOLATE, FERRITIN, TIBC, IRON, RETICCTPCT in the last 72 hours. Sepsis Labs: Recent Labs  Lab 12/24/19 1807  LATICACIDVEN 0.9    Recent Results (from the past 240 hour(s))  Resp Panel by RT-PCR (Flu A&B, Covid) Nasopharyngeal Swab     Status: None   Collection Time: 12/24/19  1:19 PM   Specimen: Nasopharyngeal Swab; Nasopharyngeal(NP) swabs in vial transport medium  Result Value Ref Range Status   SARS Coronavirus 2 by RT PCR NEGATIVE NEGATIVE Final    Comment: (NOTE) SARS-CoV-2 target nucleic acids are NOT DETECTED.  The SARS-CoV-2 RNA is generally detectable in upper respiratory specimens during  the acute phase of infection. The lowest concentration of SARS-CoV-2 viral copies this assay can detect is 138 copies/mL. A negative result does not preclude SARS-Cov-2 infection and should not be used as the sole basis for treatment or other patient management decisions. A negative result may occur with  improper specimen collection/handling, submission of specimen other than nasopharyngeal swab, presence of viral mutation(s) within the areas targeted by this assay, and inadequate number of viral copies(<138 copies/mL). A negative result must be combined with clinical observations, patient history, and epidemiological information. The expected result is Negative.  Fact Sheet for Patients:  BloggerCourse.com  Fact Sheet for Healthcare Providers:  SeriousBroker.it  This test is no t yet approved or cleared  by the [place] and  has been authorized for detection and/or diagnosis of SARS-CoV-2 by FDA under an Emergency Use Authorization (EUA). This EUA will remain  in effect (meaning this test can be used) for the duration of the COVID-19 declaration under Section 564(b)(1) of the Act, 21 U.S.C.section 360bbb-3(b)(1), unless the authorization is terminated  or revoked sooner.       Influenza A by PCR NEGATIVE NEGATIVE Final   Influenza B by PCR NEGATIVE NEGATIVE Final    Comment: (NOTE) The Xpert Xpress SARS-CoV-2/FLU/RSV plus assay is intended as an aid in the diagnosis of influenza from Nasopharyngeal swab specimens and should not be used as a sole basis for treatment. Nasal washings and aspirates are unacceptable for Xpert Xpress SARS-CoV-2/FLU/RSV testing.  Fact Sheet for Patients: BloggerCourse.com  Fact Sheet for Healthcare Providers: SeriousBroker.it  This test is not yet approved or cleared by the [place] FDA and has been authorized for detection and/or  diagnosis of SARS-CoV-2 by FDA under an Emergency Use Authorization (EUA). This EUA will remain in effect (meaning this test can be used) for the duration of the COVID-19 declaration under Section 564(b)(1) of the Act, 21 U.S.C. section 360bbb-3(b)(1), unless the authorization is terminated or revoked.  Performed at [facility], [address], [place], [place] [address]   MRSA PCR Screening     Status: None   Collection Time: 12/24/19  7:53 PM   Specimen: Nasopharyngeal  Result Value Ref Range Status   MRSA by PCR NEGATIVE NEGATIVE Final    Comment:        The GeneXpert MRSA Assay (FDA approved for NASAL specimens only), is one component of a comprehensive MRSA colonization surveillance program. It is not intended to diagnose MRSA infection nor to guide or monitor treatment for MRSA infections. Performed at [facility], [address] [place], [place] [address]          Radiology Studies: CT RENAL STONE STUDY  Result Date: 12/24/2019 CLINICAL DATA:  RIGHT flank pain EXAM: CT ABDOMEN AND PELVIS WITHOUT CONTRAST TECHNIQUE: Multidetector CT imaging of the abdomen and pelvis was performed following the standard protocol without IV contrast. COMPARISON:  October 20, 2019 FINDINGS: Evaluation is limited secondary to lack of IV contrast. Lower chest: No acute abnormality. Hepatobiliary: Hepatic steatosis. Layering cholelithiasis. Gallbladder is otherwise unremarkable. No intrahepatic or extrahepatic biliary ductal dilation. Pancreas: No peripancreatic fat stranding. Spleen: Spleen is unremarkable. Adrenals/Urinary Tract: Adrenal glands are unremarkable. No hydronephrosis. No nephrolithiasis. Bladder is unremarkable. Stomach/Bowel: Stomach is within normal limits. Appendix appears normal. No evidence of bowel wall thickening, distention, or inflammatory changes. Vascular/Lymphatic: Atherosclerotic calcifications of the aorta. No suspicious  lymphadenopathy. Reproductive: Uterus is present.  Unremarkable adnexa. Other: No free air or free fluid. Musculoskeletal: Lower lumbar facet arthropathy. IMPRESSION: No etiology for acute abdominal pain identified. Aortic Atherosclerosis (ICD10-I70.0). Electronically Signed   By: Meda Klinefelter MD   On: 12/24/2019 16:20        Scheduled Meds: . amLODipine  10 mg Oral Daily  . folic acid  1 mg Oral Daily  . heparin  5,000 Units Subcutaneous Q8H  . influenza vac split quadrivalent PF  0.5 mL Intramuscular Tomorrow-1000  . LORazepam  0-4 mg Intravenous Q6H   Or  . LORazepam  0-4 mg Oral Q6H  . [START ON 12/26/2019] LORazepam  0-4 mg Intravenous Q12H   Or  . [START ON 12/26/2019] LORazepam  0-4 mg Oral Q12H  . metoprolol succinate  100 mg Oral Daily  . multivitamin with minerals  1 tablet Oral Daily  . pantoprazole  40 mg Oral Daily  . sertraline  100 mg Oral Daily  . thiamine  100 mg Oral Daily   Or  . thiamine  100 mg Intravenous Daily   Continuous Infusions: . sodium chloride 125 mL/hr at 12/25/19 0744     LOS: 0 days    Alwyn RenElizabeth G Judithann Villamar, MD 12/25/2019, 3:08 PM

## 2019-12-26 LAB — CBC
HCT: 30.3 % — ABNORMAL LOW (ref 36.0–46.0)
Hemoglobin: 9.1 g/dL — ABNORMAL LOW (ref 12.0–15.0)
MCH: 25.3 pg — ABNORMAL LOW (ref 26.0–34.0)
MCHC: 30 g/dL (ref 30.0–36.0)
MCV: 84.4 fL (ref 80.0–100.0)
Platelets: 199 10*3/uL (ref 150–400)
RBC: 3.59 MIL/uL — ABNORMAL LOW (ref 3.87–5.11)
RDW: 19.9 % — ABNORMAL HIGH (ref 11.5–15.5)
WBC: 5.6 10*3/uL (ref 4.0–10.5)
nRBC: 0 % (ref 0.0–0.2)

## 2019-12-26 LAB — BASIC METABOLIC PANEL
Anion gap: 11 (ref 5–15)
BUN: 7 mg/dL (ref 6–20)
CO2: 21 mmol/L — ABNORMAL LOW (ref 22–32)
Calcium: 8 mg/dL — ABNORMAL LOW (ref 8.9–10.3)
Chloride: 107 mmol/L (ref 98–111)
Creatinine, Ser: 0.66 mg/dL (ref 0.44–1.00)
GFR, Estimated: >60 mL/min (ref >60)
Glucose, Bld: 78 mg/dL (ref 70–99)
Potassium: 3.7 mmol/L (ref 3.5–5.1)
Sodium: 139 mmol/L (ref 135–145)

## 2019-12-26 MED ORDER — ONDANSETRON HCL 4 MG PO TABS: 4.0000 mg | ORAL_TABLET | Freq: Four times a day (QID) | ORAL | 0 refills | Status: DC | PRN

## 2019-12-26 MED ORDER — FOLIC ACID 1 MG PO TABS: 1.0000 mg | ORAL_TABLET | Freq: Every day | ORAL | Status: DC

## 2019-12-26 MED ORDER — THIAMINE HCL 100 MG PO TABS: 100.0000 mg | ORAL_TABLET | Freq: Every day | ORAL | Status: DC

## 2019-12-26 MED ORDER — ADULT MULTIVITAMIN W/MINERALS CH: 1.0000 | ORAL_TABLET | Freq: Every day | ORAL | Status: DC

## 2019-12-26 NOTE — Discharge Summary (Addendum)
Physician Discharge Summary  Nicole Hughes:[id number] DOB: 1970/03/03 DOA: 12/24/2019  PCP: Judy Pimple, MD  Admit date: 12/24/2019 Discharge date: 12/26/2019  Admitted From home Disposition: Home Recommendations for Outpatient Follow-up:  1. Follow up with PCP in 1-2 weeks 2. Please obtain CMP/CBC in one week 3. Medication changes made at the time of discharge I have stopped her hydrochlorothiazide she is already on 3 different antihypertensives which are being continued except the HCTZ.  Please follow-up this as an outpatient.   Home Health: None Equipment/Devices: None  Discharge Condition: Stable CODE STATUS full code Diet recommendation: Cardiac Brief/Interim Summary:49 year old female with past medical history of alcohol abuse, hypertension, anxiety, peripartum DVT and PE, who presents to the ED with intractable nausea and vomiting since yesterday. She had a similar presentation about a month ago at which point she had left AMA. States that she was in her usual state of health and started having intractable nausea vomiting last night and has been unable to keep any food down. Has been titrating down her alcohol use as she is trying to quit, initially was drinking 1/5 of whiskey and now drinks a pint daily, last drink was yesterday afternoon. She is also complaining of right flank pain for the same period of time without any dysuria. States that she recently started HCTZ about a month ago. No history of nephrolithiasis    Discharge Diagnoses:  Principal Problem:   AKI (acute kidney injury) (HCC) Active Problems:   Hypertension   Right flank pain   Alcohol abuse   Pancreatitis   Alcoholic hepatitis without ascites   Hypomagnesemia     #1 alcohol withdrawal -patient was treated with CIWA protocol and IV fluids.  On the day of discharge she was walking and was able to tolerate a diet.   #2 AKI resolved with IV hydration creatinine 0.66 on the day of  discharge.  #3 hypomagnesemia resolved potassium 3.7.  #4 elevated LFTs improving secondary to alcoholic hepatitis   #5 history of essential hypertension on amlodipine and Toprol and lisinopril and HCTZ at home.  At the time of discharge she was restarted on Norvasc lisinopril and Toprol.  HCTZ was stopped due to her blood pressure being soft.   #6 depression on Zoloft.  #7 chronic pancreatitis -patient tolerated a diet prior to discharge.    Estimated body mass index is 26.45 kg/m as calculated from the following:   Height as of this encounter:  (1.651 m).   Weight as of this encounter: 72.1 kg.  Discharge Instructions  Discharge Instructions    Diet - low sodium heart healthy   Complete by: As directed    Increase activity slowly   Complete by: As directed      Allergies as of 12/26/2019      Reactions   Lisinopril Cough      Medication List    STOP taking these medications   etodolac 300 MG capsule Commonly known as: LODINE   hydrochlorothiazide 12.5 MG capsule Commonly known as: MICROZIDE   methocarbamol 500 MG tablet Commonly known as: ROBAXIN   naltrexone 50 MG tablet Commonly known as: DEPADE     TAKE these medications   amLODipine 10 MG tablet Commonly known as: NORVASC Take 1 tablet by mouth once daily   folic acid 1 MG tablet Commonly known as: FOLVITE Take 1 tablet (1 mg total) by mouth daily.   hydrOXYzine 25 MG tablet Commonly known as: ATARAX/VISTARIL TAKE 1 TABLET BY MOUTH EVERY  8 HOURS AS NEEDED FOR ANXIETY OR  NAUSEA.  CAUTION  OF  SEDATION What changed: See the new instructions.   lisinopril 10 MG tablet Commonly known as: ZESTRIL Take 2 tablets by mouth once daily What changed: how much to take   metoprolol succinate 100 MG 24 hr tablet Commonly known as: TOPROL-XL Take 1 tablet (100 mg total) by mouth daily. Take with or immediately following a meal.   mirtazapine 15 MG tablet Commonly known as: REMERON TAKE 1 TABLET  BY MOUTH AT BEDTIME What changed:   when to take this  reasons to take this   multivitamin with minerals Tabs tablet Take 1 tablet by mouth daily.   omeprazole 20 MG capsule Commonly known as: PRILOSEC Take 1 capsule by mouth once daily   ondansetron 4 MG tablet Commonly known as: ZOFRAN Take 1 tablet (4 mg total) by mouth every 6 (six) hours as needed for nausea.   sertraline 50 MG tablet Commonly known as: ZOLOFT Take 100 mg by mouth daily.   sucralfate 1 GM/10ML suspension Commonly known as: CARAFATE Take 10 mLs (1 g total) by mouth 4 (four) times daily. What changed:   when to take this  reasons to take this   thiamine 100 MG tablet Take 1 tablet (100 mg total) by mouth daily.       Allergies  Allergen Reactions  . Lisinopril Cough    Consultations: None  Procedures/Studies: CT RENAL STONE STUDY  Result Date: 12/24/2019 CLINICAL DATA:  RIGHT flank pain EXAM: CT ABDOMEN AND PELVIS WITHOUT CONTRAST TECHNIQUE: Multidetector CT imaging of the abdomen and pelvis was performed following the standard protocol without IV contrast. COMPARISON:  October 20, 2019 FINDINGS: Evaluation is limited secondary to lack of IV contrast. Lower chest: No acute abnormality. Hepatobiliary: Hepatic steatosis. Layering cholelithiasis. Gallbladder is otherwise unremarkable. No intrahepatic or extrahepatic biliary ductal dilation. Pancreas: No peripancreatic fat stranding. Spleen: Spleen is unremarkable. Adrenals/Urinary Tract: Adrenal glands are unremarkable. No hydronephrosis. No nephrolithiasis. Bladder is unremarkable. Stomach/Bowel: Stomach is within normal limits. Appendix appears normal. No evidence of bowel wall thickening, distention, or inflammatory changes. Vascular/Lymphatic: Atherosclerotic calcifications of the aorta. No suspicious lymphadenopathy. Reproductive: Uterus is present.  Unremarkable adnexa. Other: No free air or free fluid. Musculoskeletal: Lower lumbar facet  arthropathy. IMPRESSION: No etiology for acute abdominal pain identified. Aortic Atherosclerosis (ICD10-I70.0). Electronically Signed   By: Meda Klinefelter MD   On: 12/24/2019 16:20    (Echo, Carotid, EGD, Colonoscopy, ERCP)    Subjective:  Patient resting in bed awake alert tolerated a diet anxious to go home Discharge Exam: Vitals:   12/25/19 2156 12/26/19 0536  BP: (!) 141/70 130/86  Pulse: 87 67  Resp: 18 14  Temp: 98.9 F (37.2 C) 98.4 F (36.9 C)  SpO2: 99% 100%   Vitals:   12/25/19 0604 12/25/19 1350 12/25/19 2156 12/26/19 0536  BP: 122/76 (!) 150/85 (!) 141/70 130/86  Pulse: 96 85 87 67  Resp: 18 15 18 14   Temp: 98.4 F (36.9 C) 98.3 F (36.8 C) 98.9 F (37.2 C) 98.4 F (36.9 C)  TempSrc: Oral Oral Oral Oral  SpO2: 98% 100% 99% 100%  Weight:      Height:        General: Pt is alert, awake, not in acute distress Cardiovascular: RRR, S1/S2 +, no rubs, no gallops Respiratory: CTA bilaterally, no wheezing, no rhonchi Abdominal: Soft, NT, ND, bowel sounds + Extremities: no edema, no cyanosis    The results of significant  diagnostics from this hospitalization (including imaging, microbiology, ancillary and laboratory) are listed below for reference.     Microbiology: Recent Results (from the past 240 hour(s))  Resp Panel by RT-PCR (Flu A&B, Covid) Nasopharyngeal Swab     Status: None   Collection Time: 12/24/19  1:19 PM   Specimen: Nasopharyngeal Swab; Nasopharyngeal(NP) swabs in vial transport medium  Result Value Ref Range Status   SARS Coronavirus 2 by RT PCR NEGATIVE NEGATIVE Final    Comment: (NOTE) SARS-CoV-2 target nucleic acids are NOT DETECTED.  The SARS-CoV-2 RNA is generally detectable in upper respiratory specimens during the acute phase of infection. The lowest concentration of SARS-CoV-2 viral copies this assay can detect is 138 copies/mL. A negative result does not preclude SARS-Cov-2 infection and should not be used as the sole basis  for treatment or other patient management decisions. A negative result may occur with  improper specimen collection/handling, submission of specimen other than nasopharyngeal swab, presence of viral mutation(s) within the areas targeted by this assay, and inadequate number of viral copies(<138 copies/mL). A negative result must be combined with clinical observations, patient history, and epidemiological information. The expected result is Negative.  Fact Sheet for Patients:  BloggerCourse.com  Fact Sheet for Healthcare Providers:  SeriousBroker.it  This test is no t yet approved or cleared by the [place] FDA and  has been authorized for detection and/or diagnosis of SARS-CoV-2 by FDA under an Emergency Use Authorization (EUA). This EUA will remain  in effect (meaning this test can be used) for the duration of the COVID-19 declaration under Section 564(b)(1) of the Act, 21 U.S.C.section 360bbb-3(b)(1), unless the authorization is terminated  or revoked sooner.       Influenza A by PCR NEGATIVE NEGATIVE Final   Influenza B by PCR NEGATIVE NEGATIVE Final    Comment: (NOTE) The Xpert Xpress SARS-CoV-2/FLU/RSV plus assay is intended as an aid in the diagnosis of influenza from Nasopharyngeal swab specimens and should not be used as a sole basis for treatment. Nasal washings and aspirates are unacceptable for Xpert Xpress SARS-CoV-2/FLU/RSV testing.  Fact Sheet for Patients: BloggerCourse.com  Fact Sheet for Healthcare Providers: SeriousBroker.it  This test is not yet approved or cleared by the [place] FDA and has been authorized for detection and/or diagnosis of SARS-CoV-2 by FDA under an Emergency Use Authorization (EUA). This EUA will remain in effect (meaning this test can be used) for the duration of the COVID-19 declaration under Section 564(b)(1) of the Act, 21  U.S.C. section 360bbb-3(b)(1), unless the authorization is terminated or revoked.  Performed at [facility], [address] [place], [place] [address]   MRSA PCR Screening     Status: None   Collection Time: 12/24/19  7:53 PM   Specimen: Nasopharyngeal  Result Value Ref Range Status   MRSA by PCR NEGATIVE NEGATIVE Final    Comment:        The GeneXpert MRSA Assay (FDA approved for NASAL specimens only), is one component of a comprehensive MRSA colonization surveillance program. It is not intended to diagnose MRSA infection nor to guide or monitor treatment for MRSA infections. Performed at [facility], [address] [place], [place] [address]      Labs: BNP (last 3 results) No results for input(s): BNP in the last 8760 hours. Basic Metabolic Panel: Recent Labs  Lab 12/24/19 1312 12/24/19 1607 12/25/19 0623 12/26/19 0519  NA 135 136 137 139  K 4.6 3.7 3.6 3.7  CL  92* 99 103 107  CO2 21* 24 24 21*  GLUCOSE 157* 102* 90 78  BUN 31* 26* 17 7  CREATININE 2.70* 1.83* 1.03* 0.66  CALCIUM 9.4 8.0* 8.0* 8.0*  MG 1.5* 2.4  --   --   PHOS  --  3.1  --   --    Liver Function Tests: Recent Labs  Lab 12/24/19 1312 12/24/19 1607  AST 134* 88*  ALT 114* 90*  ALKPHOS 51 40  BILITOT 0.7 0.5  PROT 9.1* 7.1  ALBUMIN 5.2* 4.1   Recent Labs  Lab 12/24/19 1312  LIPASE 84*   No results for input(s): AMMONIA in the last 168 hours. CBC: Recent Labs  Lab 12/24/19 1312 12/24/19 1607 12/25/19 0623 12/26/19 0519  WBC 10.1 8.6 7.9 5.6  NEUTROABS 8.3*  --   --   --   HGB 10.5* 8.7* 8.4* 9.1*  HCT 32.3* 27.8* 27.6* 30.3*  MCV 77.3* 78.5* 81.2 84.4  PLT 360 278 239 199   Cardiac Enzymes: No results for input(s): CKTOTAL, CKMB, CKMBINDEX, TROPONINI in the last 168 hours. BNP: Invalid input(s): POCBNP CBG: No results for input(s): GLUCAP in the last 168 hours. D-Dimer No results for input(s): DDIMER in the last 72  hours. Hgb A1c No results for input(s): HGBA1C in the last 72 hours. Lipid Profile No results for input(s): CHOL, HDL, LDLCALC, TRIG, CHOLHDL, LDLDIRECT in the last 72 hours. Thyroid function studies No results for input(s): TSH, T4TOTAL, T3FREE, THYROIDAB in the last 72 hours.  Invalid input(s): FREET3 Anemia work up No results for input(s): VITAMINB12, FOLATE, FERRITIN, TIBC, IRON, RETICCTPCT in the last 72 hours. Urinalysis    Component Value Date/Time   COLORURINE AMBER (A) 11/27/2019 1012   APPEARANCEUR CLOUDY (A) 11/27/2019 1012   LABSPEC 1.024 11/27/2019 1012   PHURINE 5.0 11/27/2019 1012   GLUCOSEU NEGATIVE 11/27/2019 1012   HGBUR SMALL (A) 11/27/2019 1012   BILIRUBINUR NEGATIVE 11/27/2019 1012   BILIRUBINUR trace 12/20/2012 1553   KETONESUR 5 (A) 11/27/2019 1012   PROTEINUR >=300 (A) 11/27/2019 1012   UROBILINOGEN 0.2 06/22/2014 1147   NITRITE NEGATIVE 11/27/2019 1012   LEUKOCYTESUR LARGE (A) 11/27/2019 1012   Sepsis Labs Invalid input(s): PROCALCITONIN,  WBC,  LACTICIDVEN Microbiology Recent Results (from the past 240 hour(s))  Resp Panel by RT-PCR (Flu A&B, Covid) Nasopharyngeal Swab     Status: None   Collection Time: 12/24/19  1:19 PM   Specimen: Nasopharyngeal Swab; Nasopharyngeal(NP) swabs in vial transport medium  Result Value Ref Range Status   SARS Coronavirus 2 by RT PCR NEGATIVE NEGATIVE Final    Comment: (NOTE) SARS-CoV-2 target nucleic acids are NOT DETECTED.  The SARS-CoV-2 RNA is generally detectable in upper respiratory specimens during the acute phase of infection. The lowest concentration of SARS-CoV-2 viral copies this assay can detect is 138 copies/mL. A negative result does not preclude SARS-Cov-2 infection and should not be used as the sole basis for treatment or other patient management decisions. A negative result may occur with  improper specimen collection/handling, submission of specimen other than nasopharyngeal swab, presence of  viral mutation(s) within the areas targeted by this assay, and inadequate number of viral copies(<138 copies/mL). A negative result must be combined with clinical observations, patient history, and epidemiological information. The expected result is Negative.  Fact Sheet for Patients:  BloggerCourse.comhttps://www.fda.gov/media/152166/download  Fact Sheet for Healthcare Providers:  SeriousBroker.ithttps://www.fda.gov/media/152162/download  This test is no t yet approved or cleared by the [place] FDA and  has been  authorized for detection and/or diagnosis of SARS-CoV-2 by FDA under an Emergency Use Authorization (EUA). This EUA will remain  in effect (meaning this test can be used) for the duration of the COVID-19 declaration under Section 564(b)(1) of the Act, 21 U.S.C.section 360bbb-3(b)(1), unless the authorization is terminated  or revoked sooner.       Influenza A by PCR NEGATIVE NEGATIVE Final   Influenza B by PCR NEGATIVE NEGATIVE Final    Comment: (NOTE) The Xpert Xpress SARS-CoV-2/FLU/RSV plus assay is intended as an aid in the diagnosis of influenza from Nasopharyngeal swab specimens and should not be used as a sole basis for treatment. Nasal washings and aspirates are unacceptable for Xpert Xpress SARS-CoV-2/FLU/RSV testing.  Fact Sheet for Patients: BloggerCourse.com  Fact Sheet for Healthcare Providers: SeriousBroker.it  This test is not yet approved or cleared by the [place] FDA and has been authorized for detection and/or diagnosis of SARS-CoV-2 by FDA under an Emergency Use Authorization (EUA). This EUA will remain in effect (meaning this test can be used) for the duration of the COVID-19 declaration under Section 564(b)(1) of the Act, 21 U.S.C. section 360bbb-3(b)(1), unless the authorization is terminated or revoked.  Performed at [facility], [address] [place], [place] [address]   MRSA PCR  Screening     Status: None   Collection Time: 12/24/19  7:53 PM   Specimen: Nasopharyngeal  Result Value Ref Range Status   MRSA by PCR NEGATIVE NEGATIVE Final    Comment:        The GeneXpert MRSA Assay (FDA approved for NASAL specimens only), is one component of a comprehensive MRSA colonization surveillance program. It is not intended to diagnose MRSA infection nor to guide or monitor treatment for MRSA infections. Performed at [facility], [address], [place], [place] [address]      Time coordinating discharge:  39 minutes  SIGNED:   Alwyn Ren, MD  [facility] 12/26/2019, 8:47 AM

## 2019-12-26 NOTE — TOC Transition Note (Signed)
Transition of Care [facility]) - CM/SW Discharge Note   Patient Details  Name: [name] MRN: [id number] Date of Birth: 07/08/1970  Transition of Care [facility]) CM/SW Contact:  Bartholome Bill, RN Phone Number: 12/26/2019, 10:10 AM   Clinical Narrative:     Pt provided with Substance abuse resources and resources for Mental Health providers. Pt states appreciation.

## 2019-12-27 ENCOUNTER — Telehealth: Payer: Self-pay

## 2019-12-27 NOTE — Telephone Encounter (Signed)
Transition Care Management Unsuccessful Follow-up Telephone Call  Date of discharge and from where:  12/26/2019, [facility]   Attempts:  1st Attempt  Reason for unsuccessful TCM follow-up call:  Left voice message

## 2019-12-27 NOTE — Telephone Encounter (Signed)
Transition Care Management Unsuccessful Follow-up Telephone Call  Date of discharge and from where:  12/26/2019, [name]   Attempts:  2nd Attempt  Reason for unsuccessful TCM follow-up call:  No answer/busy

## 2019-12-27 NOTE — Telephone Encounter (Signed)
Transition Care Management Unsuccessful Follow-up Telephone Call  Date of discharge and from where:  12/26/2019, [name]  Attempts:  3rd Attempt  Reason for unsuccessful TCM follow-up call:  Left voice message

## 2020-02-22 ENCOUNTER — Encounter (HOSPITAL_COMMUNITY): Payer: Self-pay

## 2020-02-22 ENCOUNTER — Emergency Department (HOSPITAL_COMMUNITY)
Admission: EM | Admit: 2020-02-22 | Discharge: 2020-02-23 | Disposition: A | Discharge status: Home / Self Care | Payer: 59 | Attending: Emergency Medicine | Admitting: Emergency Medicine

## 2020-02-22 ENCOUNTER — Other Ambulatory Visit: Payer: Self-pay

## 2020-02-22 ENCOUNTER — Emergency Department (HOSPITAL_COMMUNITY): Payer: 59

## 2020-02-22 DIAGNOSIS — R0789 Other chest pain: Secondary | ICD-10-CM | POA: Insufficient documentation

## 2020-02-22 DIAGNOSIS — I10 Essential (primary) hypertension: Secondary | ICD-10-CM | POA: Insufficient documentation

## 2020-02-22 DIAGNOSIS — R222 Localized swelling, mass and lump, trunk: Secondary | ICD-10-CM

## 2020-02-22 DIAGNOSIS — F10230 Alcohol dependence with withdrawal, uncomplicated: Secondary | ICD-10-CM

## 2020-02-22 DIAGNOSIS — F10239 Alcohol dependence with withdrawal, unspecified: Secondary | ICD-10-CM | POA: Insufficient documentation

## 2020-02-22 DIAGNOSIS — Z79899 Other long term (current) drug therapy: Secondary | ICD-10-CM | POA: Insufficient documentation

## 2020-02-22 DIAGNOSIS — F10930 Alcohol use, unspecified with withdrawal, uncomplicated: Secondary | ICD-10-CM

## 2020-02-22 LAB — CBC
HCT: 34.3 % — ABNORMAL LOW (ref 36.0–46.0)
Hemoglobin: 10.4 g/dL — ABNORMAL LOW (ref 12.0–15.0)
MCH: 24 pg — ABNORMAL LOW (ref 26.0–34.0)
MCHC: 30.3 g/dL (ref 30.0–36.0)
MCV: 79 fL — ABNORMAL LOW (ref 80.0–100.0)
Platelets: 196 10*3/uL (ref 150–400)
RBC: 4.34 MIL/uL (ref 3.87–5.11)
RDW: 18.6 % — ABNORMAL HIGH (ref 11.5–15.5)
WBC: 3.4 10*3/uL — ABNORMAL LOW (ref 4.0–10.5)
nRBC: 0 % (ref 0.0–0.2)

## 2020-02-22 LAB — BASIC METABOLIC PANEL
Anion gap: 20 — ABNORMAL HIGH (ref 5–15)
BUN: <5 mg/dL — ABNORMAL LOW (ref 6–20)
CO2: 18 mmol/L — ABNORMAL LOW (ref 22–32)
Calcium: 8.3 mg/dL — ABNORMAL LOW (ref 8.9–10.3)
Chloride: 102 mmol/L (ref 98–111)
Creatinine, Ser: 0.61 mg/dL (ref 0.44–1.00)
GFR, Estimated: >60 mL/min (ref >60)
Glucose, Bld: 75 mg/dL (ref 70–99)
Potassium: 3.1 mmol/L — ABNORMAL LOW (ref 3.5–5.1)
Sodium: 140 mmol/L (ref 135–145)

## 2020-02-22 LAB — I-STAT BETA HCG BLOOD, ED (MC, WL, AP ONLY): I-stat hCG, quantitative: <5 m[IU]/mL (ref <5)

## 2020-02-22 LAB — TROPONIN I (HIGH SENSITIVITY): Troponin I (High Sensitivity): 4 ng/L (ref <18)

## 2020-02-22 NOTE — ED Triage Notes (Signed)
Patient stated about 1 week ago she noticed a knot on her R chest/collarbone area, patient reports shortness of breath, dizziness, headache.

## 2020-02-23 LAB — TROPONIN I (HIGH SENSITIVITY): Troponin I (High Sensitivity): 4 ng/L (ref <18)

## 2020-02-23 MED ORDER — SODIUM CHLORIDE 0.9 % IV BOLUS
1000.0000 mL | Freq: Once | INTRAVENOUS | Status: AC
  Administered 2020-02-23: 1000 mL via INTRAVENOUS

## 2020-02-23 MED ORDER — METOCLOPRAMIDE HCL 5 MG/ML IJ SOLN
10.0000 mg | Freq: Once | INTRAMUSCULAR | Status: AC
  Administered 2020-02-23: 10 mg via INTRAVENOUS
  Filled 2020-02-23: qty 2

## 2020-02-23 MED ORDER — ONDANSETRON HCL 4 MG/2ML IJ SOLN
4.0000 mg | Freq: Once | INTRAMUSCULAR | Status: AC
  Administered 2020-02-23: 4 mg via INTRAVENOUS
  Filled 2020-02-23: qty 2

## 2020-02-23 MED ORDER — POTASSIUM CHLORIDE CRYS ER 20 MEQ PO TBCR
40.0000 meq | EXTENDED_RELEASE_TABLET | Freq: Once | ORAL | Status: AC
  Administered 2020-02-23: 40 meq via ORAL
  Filled 2020-02-23: qty 2

## 2020-02-23 MED ORDER — ACETAMINOPHEN 500 MG PO TABS
1000.0000 mg | ORAL_TABLET | Freq: Once | ORAL | Status: AC
  Administered 2020-02-23: 1000 mg via ORAL
  Filled 2020-02-23: qty 2

## 2020-02-23 MED ORDER — DIPHENHYDRAMINE HCL 25 MG PO CAPS
25.0000 mg | ORAL_CAPSULE | Freq: Once | ORAL | Status: AC
  Administered 2020-02-23: 25 mg via ORAL
  Filled 2020-02-23: qty 1

## 2020-02-23 MED ORDER — ONDANSETRON 4 MG PO TBDP: 4.0000 mg | ORAL_TABLET | Freq: Three times a day (TID) | ORAL | 0 refills | Status: DC | PRN

## 2020-02-23 MED ORDER — LORAZEPAM 2 MG/ML IJ SOLN
2.0000 mg | Freq: Once | INTRAMUSCULAR | Status: AC
  Administered 2020-02-23: 2 mg via INTRAVENOUS
  Filled 2020-02-23: qty 1

## 2020-02-23 MED ORDER — KETOROLAC TROMETHAMINE 15 MG/ML IJ SOLN
15.0000 mg | Freq: Once | INTRAMUSCULAR | Status: AC
  Administered 2020-02-23: 15 mg via INTRAVENOUS
  Filled 2020-02-23: qty 1

## 2020-02-23 NOTE — ED Notes (Signed)
Patient given discharge instructions. Questions were answered. Patient verbalized understanding of discharge instructions and care at home.  

## 2020-02-23 NOTE — Discharge Instructions (Signed)
Continue taking home medications as prescribed.  Use zofran as needed for nausea and vomiting.  Follow up with your primary care doctor as needed for recheck of symptoms and for further evaluation of the swelling on your chest.  Return to the ER with any new, worsening, or concerning symptoms.

## 2020-02-23 NOTE — ED Provider Notes (Signed)
[facility] EMERGENCY DEPARTMENT Provider Note   CSN: [id number] Arrival date & time: 02/22/20  2111     History Chief Complaint  Patient presents with  . Chest Pain    Nicole Hughes is a 50 y.o. female presenting for evaluation of chest pain, nausea, vomiting, headache.  Patient states last week she developed a knot on her right collarbone.  Since then, she has gradually worsening pain that began in the area, but is now diffuse throughout her chest.  She reports associated nausea and vomiting.  She has developed a headache in the past few days, but states she has been unable to tolerate p.o. for several days and this is likely the cause.  She denies fevers, chills, cough.  She reports abnormal urination and bowel movements as she was drinking heavily, and is trying to detox.  Her last drink was 2 days ago.  No recent change in medications.  She continues to take medicine for blood pressure, GERD, anxiety.  She reports a history of PE during pregnancy, no PE any other time, is not on blood thinners.  Additional history obtained in chart review.  Patient with a history of anxiety, cardiomyopathy, chronic pain, PE in pregnancy, hypertension, GERD, alcohol use  HPI     Past Medical History:  Diagnosis Date  . Anxiety   . Cardiomyopathy (HCC)   . Chronic lower back pain   . Complication of anesthesia    "hard to get under"  . Cyst of ovary   . Depression    hx  . DVT complicating pregnancy 08/1998   RLE; RUE  . Dysmenorrhea   . Dyspareunia   . 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 GERD (gastroesophageal reflux disease)   . Hay fever    "fall and spring" (02/17/2017)  . Headache    "2-3 times/.wik" (02/17/2017)  . History of chicken pox   . Hypertension   . Migraine    "monthly" (02/17/2017)  . Pelvic pain   . Pulmonary embolism (HCC) 08/1998   S/P childbirth    Patient Active Problem List   Diagnosis Date Noted  . AKI (acute kidney  injury) (HCC) 12/24/2019  . Hypomagnesemia 12/24/2019  . Intractable nausea and vomiting 11/27/2019  . Alcohol withdrawal (HCC) 11/27/2019  . Dysphagia 05/31/2019  . Insect bite 06/21/2018  . Pedal edema 03/31/2018  . Anemia 03/31/2018  . Appetite loss 03/08/2018  . Myofascial pain 03/08/2018  . IBS (irritable bowel syndrome) 03/08/2018  . Alcoholic hepatitis without ascites 03/05/2018  . Elevated LFTs 03/03/2018  . Achilles tendonitis 01/28/2018  . Abnormal cervical Papanicolaou smear 09/29/2017  . Insomnia 09/29/2017  . Pain 09/29/2017  . Palpitations 05/25/2017  . Chest wall pain 05/25/2017  . Elevated transaminase level 05/25/2017  . Depression with anxiety 02/23/2017  . 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 Hyponatremia 02/17/2017  . Hypokalemia 09/24/2016  . Pancreatitis 09/24/2016  . Right flank pain 09/22/2016  . Alcohol abuse 09/22/2016  . H/O acute pancreatitis 09/22/2016  . GERD (gastroesophageal reflux disease) 04/09/2015  . Epigastric pain 01/17/2014  . 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 Chest pain 08/20/2012  . Stress reaction 07/23/2012  . Hypertension 06/09/2012  . History of pulmonary embolism 06/09/2012  . Preop cardiovascular exam 06/08/2012  . Dysmenorrhea   . Dyspareunia   . Pelvic pain     Past Surgical History:  Procedure Laterality Date  . CESAREAN SECTION  1994  . ECTOPIC PREGNANCY SURGERY  2010  . LAPAROSCOPY  N/A 09/24/2012   Procedure: OPERATIVE LAPROSCOPY WITH LYSIS OF ADHESIONS;  Surgeon: Sherron Monday, MD;  Location: [facility];  Service: Gynecology;  Laterality: N/A;  . TONSILLECTOMY  1984  . TUBAL LIGATION  2010     OB History   No obstetric history on file.     Family History  Problem Relation Age of Onset  . Cancer - Lung Mother   . Heart failure Father   . Hypertension Father     Social History   Tobacco Use  . Smoking status: Never Smoker  . Smokeless tobacco: Never Used  Vaping Use  . Vaping Use: Never used   Substance Use Topics  . Alcohol use: Yes    Alcohol/week: 5.0 standard drinks    Types: 5 Cans of beer per week  . Drug use: Yes    Types: Marijuana    Home Medications Prior to Admission medications   Medication Sig Start Date End Date Taking? Authorizing Provider  ondansetron (ZOFRAN ODT) 4 MG disintegrating tablet Take 1 tablet (4 mg total) by mouth every 8 (eight) hours as needed for nausea or vomiting. 02/23/20  Yes Yui Mulvaney, PA-C  amLODipine (NORVASC) 10 MG tablet Take 1 tablet by mouth once daily Patient taking differently: Take 10 mg by mouth daily. 06/07/19   Tower, Audrie Gallus, MD  folic acid (FOLVITE) 1 MG tablet Take 1 tablet (1 mg total) by mouth daily. 12/26/19   Alwyn Ren, MD  hydrOXYzine (ATARAX/VISTARIL) 25 MG tablet TAKE 1 TABLET BY MOUTH EVERY 8 HOURS AS NEEDED FOR ANXIETY OR  NAUSEA.  CAUTION  OF  SEDATION Patient taking differently: Take 25 mg by mouth every 8 (eight) hours as needed for nausea or vomiting. 06/08/19   Tower, Audrie Gallus, MD  lisinopril (ZESTRIL) 10 MG tablet Take 2 tablets by mouth once daily Patient taking differently: Take by mouth daily. 08/24/19   Tower, Audrie Gallus, MD  metoprolol succinate (TOPROL-XL) 100 MG 24 hr tablet Take 1 tablet (100 mg total) by mouth daily. Take with or immediately following a meal. 07/04/19 11/27/19  Rosalio Macadamia, NP  mirtazapine (REMERON) 15 MG tablet TAKE 1 TABLET BY MOUTH AT BEDTIME Patient taking differently: Take 15 mg by mouth at bedtime as needed (sleep). 05/02/19   Tower, Audrie Gallus, MD  Multiple Vitamin (MULTIVITAMIN WITH MINERALS) TABS tablet Take 1 tablet by mouth daily. 12/26/19   Alwyn Ren, MD  omeprazole (PRILOSEC) 20 MG capsule Take 1 capsule by mouth once daily Patient taking differently: Take 20 mg by mouth daily. 10/28/19   Tower, Audrie Gallus, MD  ondansetron (ZOFRAN) 4 MG tablet Take 1 tablet (4 mg total) by mouth every 6 (six) hours as needed for nausea. 12/26/19   Alwyn Ren, MD   sertraline (ZOLOFT) 50 MG tablet Take 100 mg by mouth daily.    [provider]  sucralfate (CARAFATE) 1 GM/10ML suspension Take 10 mLs (1 g total) by mouth 4 (four) times daily. Patient taking differently: Take 1 g by mouth 4 (four) times daily as needed (For constipation). 06/21/19   Armbruster, Willaim Rayas, MD  thiamine 100 MG tablet Take 1 tablet (100 mg total) by mouth daily. 12/26/19   Alwyn Ren, MD    Allergies    Lisinopril  Review of Systems   Review of Systems  Cardiovascular: Positive for chest pain.  Gastrointestinal: Positive for abdominal distention, nausea and vomiting.  Neurological: Positive for headaches.  All other systems reviewed and are negative.  Physical Exam Updated Vital Signs BP (!) 171/89 (BP Location: Right Arm)   Pulse 84   Temp 97.6 F (36.4 C) (Oral)   Resp 17   Ht 5\' 5"  (1.651 m)   Wt 72.6 kg   SpO2 100%   BMI 26.63 kg/m   Physical Exam Vitals and nursing note reviewed.  Constitutional:      General: She is not in acute distress.    Appearance: She is well-developed and well-nourished.     Comments: Appears nontoxic  HENT:     Head: Normocephalic and atraumatic.  Eyes:     Extraocular Movements: Extraocular movements intact and EOM normal.     Conjunctiva/sclera: Conjunctivae normal.     Pupils: Pupils are equal, round, and reactive to light.  Cardiovascular:     Rate and Rhythm: Normal rate and regular rhythm.     Pulses: Normal pulses and intact distal pulses.  Pulmonary:     Effort: Pulmonary effort is normal. No respiratory distress.     Breath sounds: Normal breath sounds. No wheezing.     Comments: Diffuse ttp of the chest wall. Tender hard lesion of the sternoclavicular joint. No erythema, warmth, or redness. No fluctuance.  Chest:     Chest wall: Tenderness present.  Abdominal:     General: There is no distension.     Palpations: Abdomen is soft. There is no mass.     Tenderness: There is no abdominal  tenderness. There is no guarding or rebound.  Musculoskeletal:        General: Normal range of motion.     Cervical back: Normal range of motion and neck supple.     Comments: Tremulous  Skin:    General: Skin is warm and dry.     Capillary Refill: Capillary refill takes less than 2 seconds.  Neurological:     Mental Status: She is alert and oriented to person, place, and time.  Psychiatric:        Mood and Affect: Mood and affect normal.     ED Results / Procedures / Treatments   Labs (all labs ordered are listed, but only abnormal results are displayed) Labs Reviewed  BASIC METABOLIC PANEL - Abnormal; Notable for the following components:      Result Value   Potassium 3.1 (*)    CO2 18 (*)    BUN <5 (*)    Calcium 8.3 (*)    Anion gap 20 (*)    All other components within normal limits  CBC - Abnormal; Notable for the following components:   WBC 3.4 (*)    Hemoglobin 10.4 (*)    HCT 34.3 (*)    MCV 79.0 (*)    MCH 24.0 (*)    RDW 18.6 (*)    All other components within normal limits  I-STAT BETA HCG BLOOD, ED ([facility], [facility], [facility] ONLY)  TROPONIN I (HIGH SENSITIVITY)  TROPONIN I (HIGH SENSITIVITY)    EKG EKG Interpretation  Date/Time:  Wednesday February 22 2020 21:15:49 EST Ventricular Rate:  89 PR Interval:  158 QRS Duration: 72 QT Interval:  406 QTC Calculation: 493 R Axis:   58 Text Interpretation: Normal sinus rhythm Cannot rule out Anterior infarct , age undetermined Abnormal ECG No sig change from prior ecg Confirmed by Alvester Chourifan, Matthew [phone]([id number]) on 02/22/2020 10:08:31 PM   Radiology DG Chest 2 View  Result Date: 02/22/2020 CLINICAL DATA:  Chest pain.  Shortness of breath. EXAM: CHEST - 2 VIEW COMPARISON:  Radiograph  05/29/2019 FINDINGS: The cardiomediastinal contours are normal. The lungs are clear. Pulmonary vasculature is normal. No consolidation, pleural effusion, or pneumothorax. No acute osseous abnormalities are seen. IMPRESSION: Negative radiographs of the  chest. Electronically Signed   By: Narda Rutherford M.D.   On: 02/22/2020 21:47    Procedures Procedures   Medications Ordered in ED Medications  sodium chloride 0.9 % bolus 1,000 mL (1,000 mLs Intravenous New Bag/Given 02/23/20 0731)  LORazepam (ATIVAN) injection 2 mg (2 mg Intravenous Given 02/23/20 0731)  ondansetron (ZOFRAN) injection 4 mg (4 mg Intravenous Given 02/23/20 0731)  ketorolac (TORADOL) 15 MG/ML injection 15 mg (15 mg Intravenous Given 02/23/20 0837)  potassium chloride SA (KLOR-CON) CR tablet 40 mEq (40 mEq Oral Given 02/23/20 0837)  acetaminophen (TYLENOL) tablet 1,000 mg (1,000 mg Oral Given 02/23/20 0929)  metoCLOPramide (REGLAN) injection 10 mg (10 mg Intravenous Given 02/23/20 0929)  diphenhydrAMINE (BENADRYL) capsule 25 mg (25 mg Oral Given 02/23/20 7893)    ED Course  I have reviewed the triage vital signs and the nursing notes.  Pertinent labs & imaging results that were available during my care of the patient were reviewed by me and considered in my medical decision making (see chart for details).    MDM Rules/Calculators/A&P                          Patient presenting for evaluation of pain, headache, nausea/vomiting.  I saw patient approximately 9 hours after arrival.  On exam, patient appears nontoxic.  Exam is most consistent with alcohol withdrawal.  Also consider viral component.  Consider decreased p.o. intake causing worsening symptoms.  Labs obtained from triage interpreted by me, shows mild hypokalemia of 3.1.  Replenish orally.  Patient is mildly acidotic, likely due to decreased p.o. intake and vomiting.  Will give fluids.  Chest x-ray viewed interpreted by me, no pneumonia, no thorax effusion.  No obvious bony abnormality.  The lesion on the chest is hard/bony in nature.  Not consistent with infection.  Not consistent with soft tissue mass.  As it does not appear on x-ray, this can likely be followed up outpatient.  Troponins negative x2.  EKG nonischemic and  unchanged from previous.  Will treat symptomatically and reassess.  On reassessment, patient reports slight improvement in symptoms, however she still has headache.  She no longer appears as tremulous.  She is tolerating p.o., but reports continued nausea.  Will give another round of symptomatic medication, but plan for discharge with continued symptomatic treatment at home.  Discussed with the patient that at this time, there does not appear to be acute or life-threatening cause for symptoms requiring admission.  Discussed PCP follow-up for chest lesion.  At this time, patient appears safe for discharge.  Return precautions given.  Patient states she understands and agrees to plan.  Final Clinical Impression(s) / ED Diagnoses Final diagnoses:  Alcohol withdrawal syndrome without complication (HCC)  Atypical chest pain  Chest swelling    Rx / DC Orders ED Discharge Orders         Ordered    ondansetron (ZOFRAN ODT) 4 MG disintegrating tablet  Every 8 hours PRN        02/23/20 0929           Alveria Apley, PA-C 02/23/20 0932    Rolan Bucco, MD 02/23/20 1249

## 2020-02-25 ENCOUNTER — Emergency Department (HOSPITAL_COMMUNITY)
Admission: EM | Admit: 2020-02-25 | Discharge: 2020-02-25 | Disposition: A | Discharge status: Home / Self Care | Payer: Self-pay | Attending: Emergency Medicine | Admitting: Emergency Medicine

## 2020-02-25 ENCOUNTER — Emergency Department (HOSPITAL_COMMUNITY): Payer: Self-pay

## 2020-02-25 ENCOUNTER — Encounter (HOSPITAL_COMMUNITY): Payer: Self-pay

## 2020-02-25 ENCOUNTER — Other Ambulatory Visit: Payer: Self-pay

## 2020-02-25 DIAGNOSIS — Z86711 Personal history of pulmonary embolism: Secondary | ICD-10-CM | POA: Insufficient documentation

## 2020-02-25 DIAGNOSIS — R109 Unspecified abdominal pain: Secondary | ICD-10-CM | POA: Insufficient documentation

## 2020-02-25 DIAGNOSIS — K219 Gastro-esophageal reflux disease without esophagitis: Secondary | ICD-10-CM | POA: Insufficient documentation

## 2020-02-25 DIAGNOSIS — I10 Essential (primary) hypertension: Secondary | ICD-10-CM | POA: Insufficient documentation

## 2020-02-25 DIAGNOSIS — F419 Anxiety disorder, unspecified: Secondary | ICD-10-CM | POA: Insufficient documentation

## 2020-02-25 DIAGNOSIS — R519 Headache, unspecified: Secondary | ICD-10-CM | POA: Insufficient documentation

## 2020-02-25 DIAGNOSIS — H1131 Conjunctival hemorrhage, right eye: Secondary | ICD-10-CM | POA: Insufficient documentation

## 2020-02-25 DIAGNOSIS — R079 Chest pain, unspecified: Secondary | ICD-10-CM | POA: Insufficient documentation

## 2020-02-25 DIAGNOSIS — R0602 Shortness of breath: Secondary | ICD-10-CM | POA: Insufficient documentation

## 2020-02-25 LAB — BASIC METABOLIC PANEL
Anion gap: 18 — ABNORMAL HIGH (ref 5–15)
BUN: 6 mg/dL (ref 6–20)
CO2: 17 mmol/L — ABNORMAL LOW (ref 22–32)
Calcium: 8.6 mg/dL — ABNORMAL LOW (ref 8.9–10.3)
Chloride: 106 mmol/L (ref 98–111)
Creatinine, Ser: 0.65 mg/dL (ref 0.44–1.00)
GFR, Estimated: >60 mL/min (ref >60)
Glucose, Bld: 89 mg/dL (ref 70–99)
Potassium: 3.4 mmol/L — ABNORMAL LOW (ref 3.5–5.1)
Sodium: 141 mmol/L (ref 135–145)

## 2020-02-25 LAB — CBC
HCT: 33.3 % — ABNORMAL LOW (ref 36.0–46.0)
Hemoglobin: 10.1 g/dL — ABNORMAL LOW (ref 12.0–15.0)
MCH: 24.6 pg — ABNORMAL LOW (ref 26.0–34.0)
MCHC: 30.3 g/dL (ref 30.0–36.0)
MCV: 81 fL (ref 80.0–100.0)
Platelets: 206 10*3/uL (ref 150–400)
RBC: 4.11 MIL/uL (ref 3.87–5.11)
RDW: 19.2 % — ABNORMAL HIGH (ref 11.5–15.5)
WBC: 7.4 10*3/uL (ref 4.0–10.5)
nRBC: 0 % (ref 0.0–0.2)

## 2020-02-25 LAB — TROPONIN I (HIGH SENSITIVITY)
Troponin I (High Sensitivity): 6 ng/L (ref <18)
Troponin I (High Sensitivity): 6 ng/L (ref <18)

## 2020-02-25 LAB — I-STAT BETA HCG BLOOD, ED (MC, WL, AP ONLY): I-stat hCG, quantitative: <5 m[IU]/mL (ref <5)

## 2020-02-25 MED ORDER — DICYCLOMINE HCL 20 MG PO TABS: 20.0000 mg | ORAL_TABLET | Freq: Two times a day (BID) | ORAL | 0 refills | Status: DC

## 2020-02-25 MED ORDER — IOHEXOL 350 MG/ML SOLN
100.0000 mL | Freq: Once | INTRAVENOUS | Status: AC | PRN
  Administered 2020-02-25: 100 mL via INTRAVENOUS

## 2020-02-25 MED ORDER — HALOPERIDOL LACTATE 5 MG/ML IJ SOLN
2.0000 mg | Freq: Once | INTRAMUSCULAR | Status: AC
  Administered 2020-02-25: 2 mg via INTRAVENOUS
  Filled 2020-02-25: qty 1

## 2020-02-25 MED ORDER — SODIUM CHLORIDE 0.9 % IV BOLUS
1000.0000 mL | Freq: Once | INTRAVENOUS | Status: AC
  Administered 2020-02-25: 1000 mL via INTRAVENOUS

## 2020-02-25 MED ORDER — KETOROLAC TROMETHAMINE 15 MG/ML IJ SOLN
15.0000 mg | Freq: Once | INTRAMUSCULAR | Status: AC
  Administered 2020-02-25: 15 mg via INTRAVENOUS
  Filled 2020-02-25: qty 1

## 2020-02-25 MED ORDER — LABETALOL HCL 5 MG/ML IV SOLN
10.0000 mg | Freq: Once | INTRAVENOUS | Status: AC
  Administered 2020-02-25: 10 mg via INTRAVENOUS
  Filled 2020-02-25: qty 4

## 2020-02-25 MED ORDER — LORAZEPAM 2 MG/ML IJ SOLN
1.0000 mg | Freq: Once | INTRAMUSCULAR | Status: AC
  Administered 2020-02-25: 1 mg via INTRAVENOUS
  Filled 2020-02-25: qty 1

## 2020-02-25 NOTE — ED Triage Notes (Signed)
Patient seen for same on 2/16, chest pain with shortness of breath and headache. Patient reports her headache is much worse with severe pain, now with redness to R eye.

## 2020-02-25 NOTE — ED Notes (Signed)
Patient verbalizes understanding of discharge instructions. Prescriptions reviewed. Opportunity for questioning and answers were provided. Armband removed by staff, pt discharged from ED ambulatory. ° °

## 2020-02-25 NOTE — ED Notes (Addendum)
Pt complaining of chest and head pain. Chest pain radiates to back of neck and right arm. Pt had an episode of vomiting.

## 2020-02-25 NOTE — ED Provider Notes (Signed)
[facility]EM[facility]Y DEPT [facility] Emergency Department Provider Note MRN:  [id number]  Arrival date & time: 02/25/20     Chief Complaint   Chest Pain, Headache, and Eye Problem   History of Present Illness   Nicole Hughes is a 50 y.o. year-old female with a history of cardiomyopathy presenting to the ED with chief complaint of chest pain, headache.  Severe headache, chest pain, abdominal pain for the past several hours.  High blood pressure.  Also redness to the right eye but no vision change, no pain to the right eye.  No exacerbating or alleviating factors.  Review of Systems  A complete 10 system review of systems was obtained and all systems are negative except as noted in the HPI and PMH.   Patient's Health History    Past Medical History:  Diagnosis Date  . Anxiety   . Cardiomyopathy (HCC)   . Chronic lower back pain   . Complication of anesthesia    "hard to get under"  . Cyst of ovary   . Depression    hx  . DVT complicating pregnancy 08/1998   RLE; RUE  . Dysmenorrhea   . Dyspareunia   . 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 GERD (gastroesophageal reflux disease)   . Hay fever    "fall and spring" (02/17/2017)  . Headache    "2-3 times/.wik" (02/17/2017)  . History of chicken pox   . Hypertension   . Migraine    "monthly" (02/17/2017)  . Pelvic pain   . Pulmonary embolism (HCC) 08/1998   S/P childbirth    Past Surgical History:  Procedure Laterality Date  . CESAREAN SECTION  1994  . ECTOPIC PREGNANCY SURGERY  2010  . LAPAROSCOPY N/A 09/24/2012   Procedure: OPERATIVE LAPROSCOPY WITH LYSIS OF ADHESIONS;  Surgeon: Sherron Monday, MD;  Location: [facility];  Service: Gynecology;  Laterality: N/A;  . TONSILLECTOMY  1984  . TUBAL LIGATION  2010    Family History  Problem Relation Age of Onset  . Cancer - Lung Mother   . Heart failure Father   . Hypertension Father     Social History   Socioeconomic History  . Marital status: Married    Spouse  name: Not on file  . Number of children: Not on file  . Years of education: Not on file  . Highest education level: Not on file  Occupational History  . Not on file  Tobacco Use  . Smoking status: Never Smoker  . Smokeless tobacco: Never Used  Vaping Use  . Vaping Use: Never used  Substance and Sexual Activity  . Alcohol use: Yes    Alcohol/week: 5.0 standard drinks    Types: 5 Cans of beer per week  . Drug use: Yes    Types: Marijuana  . Sexual activity: Not Currently    Birth control/protection: Surgical  Other Topics Concern  . Not on file  Social History Narrative  . Not on file   Social Determinants of Health   Financial Resource Strain: Not on file  Food Insecurity: Not on file  Transportation Needs: Not on file  Physical Activity: Not on file  Stress: Not on file  Social Connections: Not on file  Intimate Partner Violence: Not on file     Physical Exam   Vitals:   02/25/20 0630 02/25/20 0645  BP: (!) 178/88 (!) 172/92  Pulse: 90 85  Resp: 12 14  Temp:    SpO2: 96% 98%    CONSTITUTIONAL:  Anxious appearing NEURO:  Alert and oriented x 3, no focal deficits EYES:  eyes equal and reactive ENT/NECK:  no LAD, no JVD CARDIO: Tachycardic rate, well-perfused, normal S1 and S2 PULM:  CTAB no wheezing or rhonchi, tachypneic GI/GU:  normal bowel sounds, non-distended, non-tender MSK/SPINE:  No gross deformities, no edema SKIN:  no rash, atraumatic PSYCH:  Appropriate speech and behavior  *Additional and/or pertinent findings included in MDM below  Diagnostic and Interventional Summary    EKG Interpretation  Date/Time:  February 25, 2020 Ventricular Rate:  122 PR Interval:  148 QRS Duration: 66 QT Interval:  322 QTC Calculation: 458 R Axis:     Text Interpretation: Sinus tachycardia, no ischemic findings, normal intervals Confirm by Dr. Kennis Carina at 7:13 AM      Labs Reviewed  BASIC METABOLIC PANEL - Abnormal; Notable for the following  components:      Result Value   Potassium 3.4 (*)    CO2 17 (*)    Calcium 8.6 (*)    Anion gap 18 (*)    All other components within normal limits  CBC - Abnormal; Notable for the following components:   Hemoglobin 10.1 (*)    HCT 33.3 (*)    MCH 24.6 (*)    RDW 19.2 (*)    All other components within normal limits  I-STAT BETA HCG BLOOD, ED ([facility], [facility], [facility] ONLY)  TROPONIN I (HIGH SENSITIVITY)  TROPONIN I (HIGH SENSITIVITY)    CT Angio Chest/Abd/Pel for Dissection W and/or Wo Contrast  Final Result    CT Head Wo Contrast  Final Result    DG Chest 2 View  Final Result      Medications  LORazepam (ATIVAN) injection 1 mg (1 mg Intravenous Given 02/25/20 0459)  labetalol (NORMODYNE) injection 10 mg (10 mg Intravenous Given 02/25/20 0459)  iohexol (OMNIPAQUE) 350 MG/ML injection 100 mL (100 mLs Intravenous Contrast Given 02/25/20 0458)  haloperidol lactate (HALDOL) injection 2 mg (2 mg Intravenous Given 02/25/20 0538)  sodium chloride 0.9 % bolus 1,000 mL (1,000 mLs Intravenous New Bag/Given 02/25/20 0537)  ketorolac (TORADOL) 15 MG/ML injection 15 mg (15 mg Intravenous Given 02/25/20 0538)     Procedures  /  Critical Care Procedures  ED Course and Medical Decision Making  I have reviewed the triage vital signs, the nursing notes, and pertinent available records from the EMR.  Listed above are laboratory and imaging tests that I personally ordered, reviewed, and interpreted and then considered in my medical decision making (see below for details).  Severe headache, chest pain, shortness of breath, tachycardic, hypertensive, appears anxious.  Endorses a history of panic attacks but this feels different.  Given the constellation of symptoms difficult to exclude dissection or PE.  CT imaging is reassuring, patient feeling a lot better after medications listed above.  Labs normal with negative troponin, no signs of endorgan damage.  Blood pressure is improved, tachycardia is resolved.  No  emergent process, appropriate for discharge.       Elmer Sow. Pilar Plate, MD [facility] Emergency Medicine [facility] [email or web address]  Final Clinical Impressions(s) / ED Diagnoses     ICD-10-CM   1. Chest pain, unspecified type  R07.9   2. Subconjunctival hemorrhage of right eye  H11.31   3. Anxiety  F41.9     ED Discharge Orders         Ordered    dicyclomine (BENTYL) 20 MG tablet  2 times daily  02/25/20 0705           Discharge Instructions Discussed with and Provided to Patient:     Discharge Instructions     You were evaluated in the Emergency Department and after careful evaluation, we did not find any emergent condition requiring admission or further testing in the hospital.  Your exam/testing today is overall reassuring.  Your eye redness seems to be due to a small ruptured blood vessel causing bleeding underneath the surface of the eye.  This is not an emergency and will get better on its own.  Your chest pain does not seem to be due to heart attack or any other emergencies.  Suspect he may be experiencing anxiety or panic related symptoms.  Recommend follow-up with your primary care doctor and a therapist.  Please return to the Emergency Department if you experience any worsening of your condition.   Thank you for allowing [place] to be a part of your care.       Sabas Sous, MD 02/25/20 [phone]

## 2020-02-25 NOTE — Discharge Instructions (Addendum)
You were evaluated in the Emergency Department and after careful evaluation, we did not find any emergent condition requiring admission or further testing in the hospital.  Your exam/testing today is overall reassuring.  Your eye redness seems to be due to a small ruptured blood vessel causing bleeding underneath the surface of the eye.  This is not an emergency and will get better on its own.  Your chest pain does not seem to be due to heart attack or any other emergencies.  Suspect he may be experiencing anxiety or panic related symptoms.  Recommend follow-up with your primary care doctor and a therapist.  Please return to the Emergency Department if you experience any worsening of your condition.   Thank you for allowing [place] to be a part of your care.

## 2020-03-01 ENCOUNTER — Other Ambulatory Visit: Payer: Self-pay | Admitting: Family Medicine

## 2020-04-16 ENCOUNTER — Other Ambulatory Visit: Payer: Self-pay

## 2020-04-16 ENCOUNTER — Emergency Department (HOSPITAL_COMMUNITY): Payer: Self-pay

## 2020-04-16 ENCOUNTER — Encounter (HOSPITAL_COMMUNITY): Payer: Self-pay | Admitting: Internal Medicine

## 2020-04-16 ENCOUNTER — Inpatient Hospital Stay (HOSPITAL_COMMUNITY)
Admission: EM | Admit: 2020-04-16 | Discharge: 2020-04-19 | DRG: 897 | Disposition: A | Discharge status: Home / Self Care | Payer: Self-pay | Attending: Internal Medicine | Admitting: Internal Medicine

## 2020-04-16 DIAGNOSIS — G8929 Other chronic pain: Secondary | ICD-10-CM | POA: Diagnosis present

## 2020-04-16 DIAGNOSIS — Z8719 Personal history of other diseases of the digestive system: Secondary | ICD-10-CM

## 2020-04-16 DIAGNOSIS — G43909 Migraine, unspecified, not intractable, without status migrainosus: Secondary | ICD-10-CM | POA: Diagnosis present

## 2020-04-16 DIAGNOSIS — D649 Anemia, unspecified: Secondary | ICD-10-CM | POA: Diagnosis present

## 2020-04-16 DIAGNOSIS — F10239 Alcohol dependence with withdrawal, unspecified: Secondary | ICD-10-CM | POA: Diagnosis present

## 2020-04-16 DIAGNOSIS — F419 Anxiety disorder, unspecified: Secondary | ICD-10-CM | POA: Diagnosis present

## 2020-04-16 DIAGNOSIS — K219 Gastro-esophageal reflux disease without esophagitis: Secondary | ICD-10-CM | POA: Diagnosis present

## 2020-04-16 DIAGNOSIS — M545 Low back pain, unspecified: Secondary | ICD-10-CM | POA: Diagnosis present

## 2020-04-16 DIAGNOSIS — R7989 Other specified abnormal findings of blood chemistry: Secondary | ICD-10-CM | POA: Diagnosis present

## 2020-04-16 DIAGNOSIS — R0789 Other chest pain: Secondary | ICD-10-CM

## 2020-04-16 DIAGNOSIS — Z86718 Personal history of other venous thrombosis and embolism: Secondary | ICD-10-CM

## 2020-04-16 DIAGNOSIS — I10 Essential (primary) hypertension: Secondary | ICD-10-CM | POA: Diagnosis present

## 2020-04-16 DIAGNOSIS — R079 Chest pain, unspecified: Secondary | ICD-10-CM

## 2020-04-16 DIAGNOSIS — Z9851 Tubal ligation status: Secondary | ICD-10-CM

## 2020-04-16 DIAGNOSIS — Z79899 Other long term (current) drug therapy: Secondary | ICD-10-CM

## 2020-04-16 DIAGNOSIS — R1013 Epigastric pain: Secondary | ICD-10-CM | POA: Diagnosis present

## 2020-04-16 DIAGNOSIS — B9620 Unspecified Escherichia coli [E. coli] as the cause of diseases classified elsewhere: Secondary | ICD-10-CM | POA: Diagnosis present

## 2020-04-16 DIAGNOSIS — F10231 Alcohol dependence with withdrawal delirium: Principal | ICD-10-CM | POA: Diagnosis present

## 2020-04-16 DIAGNOSIS — N390 Urinary tract infection, site not specified: Secondary | ICD-10-CM | POA: Diagnosis present

## 2020-04-16 DIAGNOSIS — I160 Hypertensive urgency: Secondary | ICD-10-CM | POA: Diagnosis present

## 2020-04-16 DIAGNOSIS — R072 Precordial pain: Secondary | ICD-10-CM | POA: Diagnosis present

## 2020-04-16 DIAGNOSIS — F32A Depression, unspecified: Secondary | ICD-10-CM | POA: Diagnosis present

## 2020-04-16 DIAGNOSIS — Z20822 Contact with and (suspected) exposure to covid-19: Secondary | ICD-10-CM | POA: Diagnosis present

## 2020-04-16 DIAGNOSIS — Z86711 Personal history of pulmonary embolism: Secondary | ICD-10-CM

## 2020-04-16 DIAGNOSIS — F10930 Alcohol use, unspecified with withdrawal, uncomplicated: Secondary | ICD-10-CM

## 2020-04-16 DIAGNOSIS — F10939 Alcohol use, unspecified with withdrawal, unspecified: Secondary | ICD-10-CM | POA: Diagnosis present

## 2020-04-16 DIAGNOSIS — F10230 Alcohol dependence with withdrawal, uncomplicated: Secondary | ICD-10-CM

## 2020-04-16 DIAGNOSIS — Z8249 Family history of ischemic heart disease and other diseases of the circulatory system: Secondary | ICD-10-CM

## 2020-04-16 DIAGNOSIS — Z888 Allergy status to other drugs, medicaments and biological substances status: Secondary | ICD-10-CM

## 2020-04-16 LAB — CBC
HCT: 32.1 % — ABNORMAL LOW (ref 36.0–46.0)
Hemoglobin: 10.4 g/dL — ABNORMAL LOW (ref 12.0–15.0)
MCH: 26.9 pg (ref 26.0–34.0)
MCHC: 32.4 g/dL (ref 30.0–36.0)
MCV: 83.2 fL (ref 80.0–100.0)
Platelets: 228 10*3/uL (ref 150–400)
RBC: 3.86 MIL/uL — ABNORMAL LOW (ref 3.87–5.11)
RDW: 20.8 % — ABNORMAL HIGH (ref 11.5–15.5)
WBC: 5.1 10*3/uL (ref 4.0–10.5)
nRBC: 0 % (ref 0.0–0.2)

## 2020-04-16 LAB — BASIC METABOLIC PANEL
Anion gap: 15 (ref 5–15)
BUN: 11 mg/dL (ref 6–20)
CO2: 23 mmol/L (ref 22–32)
Calcium: 9.1 mg/dL (ref 8.9–10.3)
Chloride: 98 mmol/L (ref 98–111)
Creatinine, Ser: 0.93 mg/dL (ref 0.44–1.00)
GFR, Estimated: >60 mL/min (ref >60)
Glucose, Bld: 106 mg/dL — ABNORMAL HIGH (ref 70–99)
Potassium: 4 mmol/L (ref 3.5–5.1)
Sodium: 136 mmol/L (ref 135–145)

## 2020-04-16 LAB — URINALYSIS, ROUTINE W REFLEX MICROSCOPIC
Bilirubin Urine: NEGATIVE
Glucose, UA: NEGATIVE mg/dL
Ketones, ur: 20 mg/dL — AB
Nitrite: POSITIVE — AB
Protein, ur: NEGATIVE mg/dL
Specific Gravity, Urine: 1.038 — ABNORMAL HIGH (ref 1.005–1.030)
pH: 8 (ref 5.0–8.0)

## 2020-04-16 LAB — I-STAT BETA HCG BLOOD, ED (MC, WL, AP ONLY): I-stat hCG, quantitative: <5 m[IU]/mL (ref <5)

## 2020-04-16 LAB — HEPATIC FUNCTION PANEL
ALT: 44 U/L (ref 0–44)
AST: 127 U/L — ABNORMAL HIGH (ref 15–41)
Albumin: 4.4 g/dL (ref 3.5–5.0)
Alkaline Phosphatase: 48 U/L (ref 38–126)
Bilirubin, Direct: 0.2 mg/dL (ref 0.0–0.2)
Indirect Bilirubin: 0.9 mg/dL (ref 0.3–0.9)
Total Bilirubin: 1.1 mg/dL (ref 0.3–1.2)
Total Protein: 7.8 g/dL (ref 6.5–8.1)

## 2020-04-16 LAB — TROPONIN I (HIGH SENSITIVITY)
Troponin I (High Sensitivity): 5 ng/L (ref <18)
Troponin I (High Sensitivity): 4 ng/L (ref <18)

## 2020-04-16 LAB — LIPASE, BLOOD: Lipase: 93 U/L — ABNORMAL HIGH (ref 11–51)

## 2020-04-16 LAB — MAGNESIUM: Magnesium: 1.6 mg/dL — ABNORMAL LOW (ref 1.7–2.4)

## 2020-04-16 MED ORDER — ONDANSETRON HCL 4 MG/2ML IJ SOLN
4.0000 mg | Freq: Four times a day (QID) | INTRAMUSCULAR | Status: DC | PRN
  Administered 2020-04-16 – 2020-04-18 (×6): 4 mg via INTRAMUSCULAR
  Filled 2020-04-16 (×6): qty 2

## 2020-04-16 MED ORDER — IOHEXOL 350 MG/ML SOLN
100.0000 mL | Freq: Once | INTRAVENOUS | Status: AC | PRN
  Administered 2020-04-16: 100 mL via INTRAVENOUS

## 2020-04-16 MED ORDER — THIAMINE HCL 100 MG PO TABS: 100.0000 mg | ORAL_TABLET | Freq: Once | ORAL | Status: DC

## 2020-04-16 MED ORDER — MAGNESIUM SULFATE 2 GM/50ML IV SOLN
2.0000 g | Freq: Once | INTRAVENOUS | Status: AC
  Administered 2020-04-16: 2 g via INTRAVENOUS
  Filled 2020-04-16: qty 50

## 2020-04-16 MED ORDER — LABETALOL HCL 5 MG/ML IV SOLN: 5.0000 mg | INTRAVENOUS | Status: DC | PRN

## 2020-04-16 MED ORDER — SODIUM CHLORIDE 0.9 % IV BOLUS
500.0000 mL | Freq: Once | INTRAVENOUS | Status: AC
  Administered 2020-04-16: 500 mL via INTRAVENOUS

## 2020-04-16 MED ORDER — ONDANSETRON HCL 4 MG/2ML IJ SOLN
4.0000 mg | Freq: Once | INTRAMUSCULAR | Status: AC
  Administered 2020-04-16: 4 mg via INTRAVENOUS
  Filled 2020-04-16: qty 2

## 2020-04-16 MED ORDER — MORPHINE SULFATE (PF) 2 MG/ML IV SOLN
2.0000 mg | INTRAVENOUS | Status: AC | PRN
  Administered 2020-04-16 – 2020-04-17 (×4): 2 mg via INTRAVENOUS
  Filled 2020-04-16 (×4): qty 1

## 2020-04-16 MED ORDER — LORAZEPAM 2 MG/ML IJ SOLN
1.0000 mg | INTRAMUSCULAR | Status: AC | PRN
Start: 2020-04-16 — End: 2020-04-19
  Administered 2020-04-16: 3 mg via INTRAVENOUS
  Administered 2020-04-16: 4 mg via INTRAVENOUS
  Administered 2020-04-16: 2 mg via INTRAVENOUS
  Administered 2020-04-17 (×2): 4 mg via INTRAVENOUS
  Administered 2020-04-17 (×2): 2 mg via INTRAVENOUS
  Filled 2020-04-16: qty 2
  Filled 2020-04-16: qty 1
  Filled 2020-04-16: qty 2
  Filled 2020-04-16: qty 1
  Filled 2020-04-16 (×2): qty 2
  Filled 2020-04-16: qty 1

## 2020-04-16 MED ORDER — ALBUTEROL SULFATE (2.5 MG/3ML) 0.083% IN NEBU: 2.5000 mg | INHALATION_SOLUTION | Freq: Four times a day (QID) | RESPIRATORY_TRACT | Status: DC | PRN

## 2020-04-16 MED ORDER — THIAMINE HCL 100 MG/ML IJ SOLN
100.0000 mg | Freq: Once | INTRAMUSCULAR | Status: AC
  Administered 2020-04-16: 100 mg via INTRAVENOUS
  Filled 2020-04-16: qty 2

## 2020-04-16 MED ORDER — ENOXAPARIN SODIUM 40 MG/0.4ML ~~LOC~~ SOLN
40.0000 mg | SUBCUTANEOUS | Status: DC
  Administered 2020-04-16 – 2020-04-18 (×3): 40 mg via SUBCUTANEOUS
  Filled 2020-04-16 (×3): qty 0.4

## 2020-04-16 MED ORDER — LISINOPRIL 20 MG PO TABS
20.0000 mg | ORAL_TABLET | Freq: Every day | ORAL | Status: DC
  Administered 2020-04-16 – 2020-04-19 (×4): 20 mg via ORAL
  Filled 2020-04-16 (×4): qty 1

## 2020-04-16 MED ORDER — SODIUM CHLORIDE 0.9% FLUSH
3.0000 mL | Freq: Two times a day (BID) | INTRAVENOUS | Status: DC
  Administered 2020-04-16 – 2020-04-18 (×5): 3 mL via INTRAVENOUS

## 2020-04-16 MED ORDER — AMLODIPINE BESYLATE 10 MG PO TABS
10.0000 mg | ORAL_TABLET | Freq: Every day | ORAL | Status: DC
  Administered 2020-04-16 – 2020-04-19 (×4): 10 mg via ORAL
  Filled 2020-04-16 (×4): qty 1

## 2020-04-16 MED ORDER — PANTOPRAZOLE SODIUM 40 MG IV SOLR
40.0000 mg | Freq: Two times a day (BID) | INTRAVENOUS | Status: DC
  Administered 2020-04-16 – 2020-04-19 (×7): 40 mg via INTRAVENOUS
  Filled 2020-04-16 (×7): qty 40

## 2020-04-16 MED ORDER — ACETAMINOPHEN 650 MG RE SUPP: 650.0000 mg | Freq: Four times a day (QID) | RECTAL | Status: DC | PRN

## 2020-04-16 MED ORDER — DICYCLOMINE HCL 20 MG PO TABS
20.0000 mg | ORAL_TABLET | Freq: Two times a day (BID) | ORAL | Status: DC
  Administered 2020-04-16 – 2020-04-19 (×6): 20 mg via ORAL
  Filled 2020-04-16 (×6): qty 1

## 2020-04-16 MED ORDER — ACETAMINOPHEN 325 MG PO TABS
650.0000 mg | ORAL_TABLET | Freq: Four times a day (QID) | ORAL | Status: DC | PRN
  Administered 2020-04-16 – 2020-04-18 (×3): 650 mg via ORAL
  Filled 2020-04-16 (×3): qty 2

## 2020-04-16 MED ORDER — LORAZEPAM 1 MG PO TABS
1.0000 mg | ORAL_TABLET | ORAL | Status: AC | PRN
  Administered 2020-04-17: 4 mg via ORAL
  Filled 2020-04-16: qty 4

## 2020-04-16 MED ORDER — SODIUM CHLORIDE 0.9 % IV SOLN
1.0000 g | INTRAVENOUS | Status: DC
  Administered 2020-04-16 – 2020-04-18 (×3): 1 g via INTRAVENOUS
  Filled 2020-04-16 (×3): qty 10

## 2020-04-16 MED ORDER — FOLIC ACID 1 MG PO TABS
1.0000 mg | ORAL_TABLET | Freq: Every day | ORAL | Status: DC
  Administered 2020-04-16 – 2020-04-17 (×2): 1 mg via ORAL
  Filled 2020-04-16 (×2): qty 1

## 2020-04-16 MED ORDER — LORAZEPAM 2 MG/ML IJ SOLN
0.0000 mg | Freq: Two times a day (BID) | INTRAMUSCULAR | Status: DC
  Administered 2020-04-18: 2 mg via INTRAVENOUS
  Filled 2020-04-16: qty 1

## 2020-04-16 MED ORDER — SUCRALFATE 1 GM/10ML PO SUSP
1.0000 g | Freq: Four times a day (QID) | ORAL | Status: DC
  Administered 2020-04-16 – 2020-04-18 (×7): 1 g via ORAL
  Filled 2020-04-16 (×10): qty 10

## 2020-04-16 MED ORDER — METOPROLOL SUCCINATE ER 100 MG PO TB24
100.0000 mg | ORAL_TABLET | Freq: Every day | ORAL | Status: DC
  Administered 2020-04-16 – 2020-04-19 (×4): 100 mg via ORAL
  Filled 2020-04-16 (×4): qty 1

## 2020-04-16 MED ORDER — ADULT MULTIVITAMIN W/MINERALS CH
1.0000 | ORAL_TABLET | Freq: Every day | ORAL | Status: DC
  Administered 2020-04-16 – 2020-04-19 (×4): 1 via ORAL
  Filled 2020-04-16 (×4): qty 1

## 2020-04-16 MED ORDER — SERTRALINE HCL 100 MG PO TABS
100.0000 mg | ORAL_TABLET | Freq: Every day | ORAL | Status: DC
  Administered 2020-04-16 – 2020-04-19 (×4): 100 mg via ORAL
  Filled 2020-04-16 (×4): qty 1

## 2020-04-16 MED ORDER — LORAZEPAM 2 MG/ML IJ SOLN
1.0000 mg | Freq: Once | INTRAMUSCULAR | Status: AC
  Administered 2020-04-16: 1 mg via INTRAVENOUS
  Filled 2020-04-16: qty 1

## 2020-04-16 MED ORDER — LORAZEPAM 2 MG/ML IJ SOLN
0.0000 mg | Freq: Four times a day (QID) | INTRAMUSCULAR | Status: DC
  Administered 2020-04-16: 1 mg via INTRAVENOUS
  Administered 2020-04-16: 2 mg via INTRAVENOUS
  Administered 2020-04-17 (×2): 4 mg via INTRAVENOUS
  Administered 2020-04-17 – 2020-04-18 (×2): 2 mg via INTRAVENOUS
  Filled 2020-04-16: qty 2
  Filled 2020-04-16 (×3): qty 1
  Filled 2020-04-16: qty 2
  Filled 2020-04-16: qty 1

## 2020-04-16 MED ORDER — MIRTAZAPINE 15 MG PO TABS
15.0000 mg | ORAL_TABLET | Freq: Every evening | ORAL | Status: DC | PRN
  Administered 2020-04-18: 15 mg via ORAL
  Filled 2020-04-16: qty 1

## 2020-04-16 NOTE — ED Triage Notes (Signed)
Patient c/o chest pain around 6 am while laying down. Stated she is a recovering alcoholic and and relapsed in December.

## 2020-04-16 NOTE — ED Notes (Signed)
Attempted to give report x2 

## 2020-04-16 NOTE — ED Notes (Signed)
Attempted to give reportx1 

## 2020-04-16 NOTE — ED Notes (Signed)
Patient reported her last alcoholic intake was 2 days ago.

## 2020-04-16 NOTE — ED Notes (Signed)
Patient transported to CT 

## 2020-04-16 NOTE — ED Provider Notes (Signed)
[facility] EMERGENCY DEPARTMENT Provider Note   CSN: [id number] Arrival date & time: 04/16/20  4696     History Chief Complaint  Patient presents with  . Chest Pain    CHARMIN AGUINIGA is a 50 y.o. female.  Patient is a 50 year old female who presents with chest pain and nausea.  She has a history of alcohol abuse and quit drinking 2 days ago.  She was trying to detox on her own but says she does not think she can do it.  This morning she started having shakiness associated with nausea and vomiting.  She also is having some pain in her mid chest.  Goes a little bit to the left side.  She also has some radiation to her mid back.  She has some associated nausea vomiting and diaphoresis.  She feels really shaky.  She does have a history of alcohol withdrawals in the past but has never had a seizure.  No associated drug use.        Past Medical History:  Diagnosis Date  . Anxiety   . Cardiomyopathy (HCC)   . Chronic lower back pain   . Complication of anesthesia    "hard to get under"  . Cyst of ovary   . Depression    hx  . DVT complicating pregnancy 08/1998   RLE; RUE  . Dysmenorrhea   . Dyspareunia   . 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 GERD (gastroesophageal reflux disease)   . Hay fever    "fall and spring" (02/17/2017)  . Headache    "2-3 times/.wik" (02/17/2017)  . History of chicken pox   . Hypertension   . Migraine    "monthly" (02/17/2017)  . Pelvic pain   . Pulmonary embolism (HCC) 08/1998   S/P childbirth    Patient Active Problem List   Diagnosis Date Noted  . AKI (acute kidney injury) (HCC) 12/24/2019  . Hypomagnesemia 12/24/2019  . Intractable nausea and vomiting 11/27/2019  . Alcohol withdrawal (HCC) 11/27/2019  . Dysphagia 05/31/2019  . Insect bite 06/21/2018  . Pedal edema 03/31/2018  . Anemia 03/31/2018  . Appetite loss 03/08/2018  . Myofascial pain 03/08/2018  . IBS (irritable bowel syndrome) 03/08/2018  .  Alcoholic hepatitis without ascites 03/05/2018  . Elevated LFTs 03/03/2018  . Achilles tendonitis 01/28/2018  . Abnormal cervical Papanicolaou smear 09/29/2017  . Insomnia 09/29/2017  . Pain 09/29/2017  . Palpitations 05/25/2017  . Chest wall pain 05/25/2017  . Elevated transaminase level 05/25/2017  . Depression with anxiety 02/23/2017  . 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 Hyponatremia 02/17/2017  . Hypokalemia 09/24/2016  . Pancreatitis 09/24/2016  . Right flank pain 09/22/2016  . Alcohol abuse 09/22/2016  . H/O acute pancreatitis 09/22/2016  . GERD (gastroesophageal reflux disease) 04/09/2015  . Epigastric pain 01/17/2014  . 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 Chest pain 08/20/2012  . Stress reaction 07/23/2012  . Hypertension 06/09/2012  . History of pulmonary embolism 06/09/2012  . Preop cardiovascular exam 06/08/2012  . Dysmenorrhea   . Dyspareunia   . Pelvic pain     Past Surgical History:  Procedure Laterality Date  . CESAREAN SECTION  1994  . ECTOPIC PREGNANCY SURGERY  2010  . LAPAROSCOPY N/A 09/24/2012   Procedure: OPERATIVE LAPROSCOPY WITH LYSIS OF ADHESIONS;  Surgeon: Sherron Monday, MD;  Location: [facility];  Service: Gynecology;  Laterality: N/A;  . TONSILLECTOMY  1984  . TUBAL LIGATION  2010     OB History  No obstetric history on file.     Family History  Problem Relation Age of Onset  . Cancer - Lung Mother   . Heart failure Father   . Hypertension Father     Social History   Tobacco Use  . Smoking status: Never Smoker  . Smokeless tobacco: Never Used  Vaping Use  . Vaping Use: Never used  Substance Use Topics  . Alcohol use: Yes    Alcohol/week: 5.0 standard drinks    Types: 5 Cans of beer per week  . Drug use: Yes    Types: Marijuana    Home Medications Prior to Admission medications   Medication Sig Start Date End Date Taking? Authorizing Provider  amLODipine (NORVASC) 10 MG tablet Take 1 tablet by mouth once daily Patient  taking differently: Take 10 mg by mouth daily. 06/07/19   Tower, Audrie Gallus, MD  dicyclomine (BENTYL) 20 MG tablet Take 1 tablet (20 mg total) by mouth 2 (two) times daily. 02/25/20   Sabas Sous, MD  folic acid (FOLVITE) 1 MG tablet Take 1 tablet (1 mg total) by mouth daily. Patient not taking: No sig reported 12/26/19   Alwyn Ren, MD  hydrOXYzine (ATARAX/VISTARIL) 25 MG tablet TAKE 1 TABLET BY MOUTH EVERY 8 HOURS AS NEEDED FOR ANXIETY OR  NAUSEA.  CAUTION  OF  SEDATION Patient taking differently: Take 25 mg by mouth every 8 (eight) hours as needed for anxiety. 06/08/19   Tower, Audrie Gallus, MD  lisinopril (ZESTRIL) 10 MG tablet Take 2 tablets by mouth once daily Patient taking differently: Take 20 mg by mouth daily. 08/24/19   Tower, Audrie Gallus, MD  metoprolol succinate (TOPROL-XL) 100 MG 24 hr tablet Take 1 tablet (100 mg total) by mouth daily. Take with or immediately following a meal. 07/04/19 11/27/19  Rosalio Macadamia, NP  mirtazapine (REMERON) 15 MG tablet TAKE 1 TABLET BY MOUTH AT BEDTIME Patient taking differently: Take 15 mg by mouth at bedtime as needed (sleep). 05/02/19   Tower, Audrie Gallus, MD  Multiple Vitamin (MULTIVITAMIN WITH MINERALS) TABS tablet Take 1 tablet by mouth daily. 12/26/19   Alwyn Ren, MD  omeprazole (PRILOSEC) 20 MG capsule Take 1 capsule by mouth once daily 03/01/20   Tower, Audrie Gallus, MD  ondansetron (ZOFRAN) 4 MG tablet Take 1 tablet (4 mg total) by mouth every 6 (six) hours as needed for nausea. 12/26/19   Alwyn Ren, MD  sertraline (ZOLOFT) 50 MG tablet Take 100 mg by mouth daily.    [provider]  sucralfate (CARAFATE) 1 GM/10ML suspension Take 10 mLs (1 g total) by mouth 4 (four) times daily. Patient taking differently: Take 1 g by mouth 4 (four) times daily as needed (For constipation). 06/21/19   Armbruster, Willaim Rayas, MD  thiamine 100 MG tablet Take 1 tablet (100 mg total) by mouth daily. Patient not taking: No sig reported 12/26/19    Alwyn Ren, MD    Allergies    Lisinopril  Review of Systems   Review of Systems  Constitutional: Positive for fatigue. Negative for chills, diaphoresis and fever.  HENT: Negative for congestion, rhinorrhea and sneezing.   Eyes: Negative.   Respiratory: Positive for shortness of breath. Negative for cough and chest tightness.   Cardiovascular: Positive for chest pain. Negative for leg swelling.  Gastrointestinal: Positive for nausea and vomiting. Negative for abdominal pain, blood in stool and diarrhea.  Genitourinary: Negative for difficulty urinating, flank pain, frequency and hematuria.  Musculoskeletal:  Negative for arthralgias and back pain.  Skin: Negative for rash.  Neurological: Positive for tremors. Negative for dizziness, speech difficulty, weakness, numbness and headaches.  Psychiatric/Behavioral: The patient is nervous/anxious.     Physical Exam Updated Vital Signs BP 139/73   Pulse 84   Temp 98.6 F (37 C) (Oral)   Resp 15   Ht 5\' 5"  (1.651 m)   Wt 72.6 kg   LMP 04/13/2020   SpO2 98%   BMI 26.63 kg/m   Physical Exam Constitutional:      Appearance: She is well-developed.  HENT:     Head: Normocephalic and atraumatic.  Eyes:     Pupils: Pupils are equal, round, and reactive to light.  Cardiovascular:     Rate and Rhythm: Normal rate and regular rhythm.     Heart sounds: Normal heart sounds.  Pulmonary:     Effort: Pulmonary effort is normal. No respiratory distress.     Breath sounds: Normal breath sounds. No wheezing or rales.  Chest:     Chest wall: No tenderness.  Abdominal:     General: Bowel sounds are normal.     Palpations: Abdomen is soft.     Tenderness: There is no abdominal tenderness. There is no guarding or rebound.  Musculoskeletal:        General: Normal range of motion.     Cervical back: Normal range of motion and neck supple.     Comments: No edema or calf tenderness  Lymphadenopathy:     Cervical: No cervical  adenopathy.  Skin:    General: Skin is warm and dry.     Findings: No rash.  Neurological:     General: No focal deficit present.     Mental Status: She is alert and oriented to person, place, and time.     Comments: Slight tremor     ED Results / Procedures / Treatments   Labs (all labs ordered are listed, but only abnormal results are displayed) Labs Reviewed  BASIC METABOLIC PANEL - Abnormal; Notable for the following components:      Result Value   Glucose, Bld 106 (*)    All other components within normal limits  CBC - Abnormal; Notable for the following components:   RBC 3.86 (*)    Hemoglobin 10.4 (*)    HCT 32.1 (*)    RDW 20.8 (*)    All other components within normal limits  HEPATIC FUNCTION PANEL - Abnormal; Notable for the following components:   AST 127 (*)    All other components within normal limits  MAGNESIUM - Abnormal; Notable for the following components:   Magnesium 1.6 (*)    All other components within normal limits  URINALYSIS, ROUTINE W REFLEX MICROSCOPIC  I-STAT BETA HCG BLOOD, ED ([facility], [facility], [facility] ONLY)  TROPONIN I (HIGH SENSITIVITY)  TROPONIN I (HIGH SENSITIVITY)    EKG EKG Interpretation  Date/Time:  Monday April 16 2020 06-14-2001 EDT Ventricular Rate:  85 PR Interval:  146 QRS Duration: 78 QT Interval:  405 QTC Calculation: 479 R Axis:   26 Text Interpretation: Sinus rhythm Low voltage, precordial leads Baseline wander in lead(s) aVF since last tracing no significant change Confirmed by 18:84:16 [phone]) on 04/16/2020 7:31:15 AM   Radiology DG Chest Portable 1 View  Result Date: 04/16/2020 CLINICAL DATA:  Chest pain EXAM: PORTABLE CHEST 1 VIEW COMPARISON:  February 25, 2020 chest radiograph and chest CT FINDINGS: Lungs are clear. Heart size and pulmonary vascularity are within  normal limits. No adenopathy. No bone lesions. IMPRESSION: Lungs clear.  Cardiac silhouette within normal limits. Electronically Signed   By: Bretta BangWilliam  Woodruff III  M.D.   On: 04/16/2020 08:07   CT Angio Chest Aorta w/CM &/OR wo/CM  Result Date: 04/16/2020 CLINICAL DATA:  Chest pain EXAM: CT ANGIOGRAPHY CHEST WITH CONTRAST TECHNIQUE: Initially, axial CT images were obtained through the chest without intravenous contrast administration. Multidetector CT imaging of the chest was performed using the standard protocol during bolus administration of intravenous contrast. Multiplanar CT image reconstructions and MIPs were obtained to evaluate the vascular anatomy. CONTRAST:  100mL OMNIPAQUE IOHEXOL 350 MG/ML SOLN COMPARISON:  Chest radiograph April 16 2020; chest CT February 25, 2020 FINDINGS: Cardiovascular: There is no intramural hematoma evident on noncontrast enhanced study. There is no evident mediastinal hematoma. No thoracic aortic aneurysm or dissection evident. There are occasional foci of calcification in the aorta. There are several foci of calcification in coronary arteries. Major venous structures appear patent. No aneurysm or dissection involving visualized great vessels. No appreciable atherosclerotic plaque or ulceration noted in the visualized great vessels. No aortic ulceration noted. No pericardial effusion or pericardial thickening. No evident pulmonary embolus. Mediastinum/Nodes: Visualized thyroid appears normal. No evident thoracic adenopathy. No esophageal lesions are appreciable. Lungs/Pleura: Lungs are clear. No evident pleural effusion. No evident pneumothorax. Trachea and major bronchial structures appear patent. Upper Abdomen: There is hepatic steatosis. Visualized upper abdominal structures otherwise appear unremarkable. Musculoskeletal: No blastic or lytic bone lesions. Subchondral cystic change noted in the left shoulder region, stable. No chest wall lesions evident. Review of the MIP images confirms the above findings. IMPRESSION: 1. No thoracic aortic aneurysm or dissection. No intramural hematoma or plaque ulceration. Mild thoracic aortic  atherosclerosis evident. Visualized great vessels appear unremarkable. 2.  No evident pulmonary embolus. 3.  Lungs clear. 4.  No evident adenopathy. 5.  Hepatic steatosis again noted. Aortic Atherosclerosis (ICD10-I70.0). Electronically Signed   By: Bretta BangWilliam  Woodruff III M.D.   On: 04/16/2020 12:16    Procedures Procedures   Medications Ordered in ED Medications  LORazepam (ATIVAN) tablet 1-4 mg ( Oral See Alternative 04/16/20 1006)    Or  LORazepam (ATIVAN) injection 1-4 mg (3 mg Intravenous Given 04/16/20 1006)  LORazepam (ATIVAN) injection 1 mg (1 mg Intravenous Given 04/16/20 0743)  sodium chloride 0.9 % bolus 500 mL (0 mLs Intravenous Stopped 04/16/20 1001)  ondansetron (ZOFRAN) injection 4 mg (4 mg Intravenous Given 04/16/20 0820)  sodium chloride 0.9 % bolus 500 mL (0 mLs Intravenous Stopped 04/16/20 1311)  thiamine (B-1) injection 100 mg (100 mg Intravenous Given 04/16/20 1037)  iohexol (OMNIPAQUE) 350 MG/ML injection 100 mL (100 mLs Intravenous Contrast Given 04/16/20 1158)    ED Course  I have reviewed the triage vital signs and the nursing notes.  Pertinent labs & imaging results that were available during my care of the patient were reviewed by me and considered in my medical decision making (see chart for details).    MDM Rules/Calculators/A&P                          Patient is a 50 year old female who presents with alcohol withdrawal.  She is tremulous and tachycardic on arrival with heart rates around 110.  She also complains of some chest pain and emesis.  Her EKG does not show any ischemic changes.  She had 2 - troponins.  I did do a CT scan to rule out dissection and this  was negative for acute abnormality.  Her chest x-ray is clear without evidence of pneumonia or pneumothorax.  This was reviewed by me.  Her labs are nonconcerning.  She was started on Ativan via CIWA protocol.  She did have some improvement in symptoms following this.  I spoke with Dr. Katrinka Blazing who will admit the  patient for her withdrawal symptoms. Final Clinical Impression(s) / ED Diagnoses Final diagnoses:  Chest pain, unspecified type  Alcohol withdrawal syndrome without complication [facility])    Rx / DC Orders ED Discharge Orders    None       Rolan Bucco, MD 04/16/20 1323

## 2020-04-16 NOTE — H&P (Addendum)
History and Physical    Nicole Hughes KZS:[id number] DOB: 03-24-1970 DOA: 04/16/2020  Referring MD/NP/PA: Rolan Bucco, MD PCP: Judy Pimple, MD  Patient coming from: home  Chief Complaint: Chest pain  I have personally briefly reviewed patient's old medical records in [facility]   HPI: Nicole Hughes is a 50 y.o. female with medical history significant of hypertension, anxiety, depression, esophageal ulcer, headaches, alcohol abuse, history of DVT/PE following childbirth, and GERD presents with complaints of severe chest pain since around 6:30 a.m.  Chest pain is located substernally and off to the left side and into her neck.  Pain is also noted to be pleuritic in nature.  She is a recovering alcoholic, but relapsed after the passing of her father at the end of last year.  She had been drinking a pint of whiskey per day on average up until 2 days ago when she tried to quit on her own.  However, she feels that she is unable to do so on her own at this time.  Reports having associated symptoms of nausea, vomiting, headache, epigastric/ left upper quadrant abdominal pain, constant low back pain, paresthesias all over, shortness of breath, insomnia unable to sleep for the last 4 days, and weight loss of at least 30 pounds.     ED Course: Upon admission into the emergency department patient was seen to be afebrile, respirations 13-21, blood pressures 139/73-193/88, and all other vital signs maintained.  Labs hemoglobin 10.4, magnesium 1.6, AST 127, Brezlyn Manrique negative x2, and all other labs relatively within normal limits.  EKG showed no significant ischemic changes.  CT scan of the chest with contrast showed no signs of PE or dissection.  Patient was given 500 mL of normal saline IV fluids and started on CIWA protocol with IV Ativan  Review of Systems  Constitutional: Positive for malaise/fatigue. Negative for fever.  HENT: Negative for ear discharge and nosebleeds.   Eyes: Negative for  photophobia and pain.  Respiratory: Positive for shortness of breath.   Cardiovascular: Positive for chest pain. Negative for leg swelling.  Gastrointestinal: Positive for abdominal pain, nausea and vomiting.  Genitourinary: Negative for dysuria and hematuria.  Musculoskeletal: Positive for back pain.  Neurological: Positive for tingling and headaches. Negative for focal weakness.  Psychiatric/Behavioral: Positive for substance abuse. The patient has insomnia.     Past Medical History:  Diagnosis Date  . Anxiety   . Cardiomyopathy (HCC)   . Chronic lower back pain   . Complication of anesthesia    "hard to get under"  . Cyst of ovary   . Depression    hx  . DVT complicating pregnancy 08/1998   RLE; RUE  . Dysmenorrhea   . Dyspareunia   . 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 GERD (gastroesophageal reflux disease)   . Hay fever    "fall and spring" (02/17/2017)  . Headache    "2-3 times/.wik" (02/17/2017)  . History of chicken pox   . Hypertension   . Migraine    "monthly" (02/17/2017)  . Pelvic pain   . Pulmonary embolism (HCC) 08/1998   S/P childbirth    Past Surgical History:  Procedure Laterality Date  . CESAREAN SECTION  1994  . ECTOPIC PREGNANCY SURGERY  2010  . LAPAROSCOPY N/A 09/24/2012   Procedure: OPERATIVE LAPROSCOPY WITH LYSIS OF ADHESIONS;  Surgeon: Sherron Monday, MD;  Location: [facility];  Service: Gynecology;  Laterality: N/A;  . TONSILLECTOMY  1984  . TUBAL LIGATION  2010     reports that she has never smoked. She has never used smokeless tobacco. She reports current alcohol use of about 5.0 standard drinks of alcohol per week. She reports current drug use. Drug: Marijuana.  Allergies  Allergen Reactions  . Lisinopril Cough    Family History  Problem Relation Age of Onset  . Cancer - Lung Mother   . Heart failure Father   . Hypertension Father     Prior to Admission medications   Medication Sig Start Date End Date Taking? Authorizing  Provider  amLODipine (NORVASC) 10 MG tablet Take 1 tablet by mouth once daily Patient taking differently: Take 10 mg by mouth daily. 06/07/19   Tower, Audrie GallusMarne A, MD  dicyclomine (BENTYL) 20 MG tablet Take 1 tablet (20 mg total) by mouth 2 (two) times daily. 02/25/20   Sabas SousBero, Michael M, MD  folic acid (FOLVITE) 1 MG tablet Take 1 tablet (1 mg total) by mouth daily. Patient not taking: No sig reported 12/26/19   Alwyn RenMathews, Elizabeth G, MD  hydrOXYzine (ATARAX/VISTARIL) 25 MG tablet TAKE 1 TABLET BY MOUTH EVERY 8 HOURS AS NEEDED FOR ANXIETY OR  NAUSEA.  CAUTION  OF  SEDATION Patient taking differently: Take 25 mg by mouth every 8 (eight) hours as needed for anxiety. 06/08/19   Tower, Audrie GallusMarne A, MD  lisinopril (ZESTRIL) 10 MG tablet Take 2 tablets by mouth once daily Patient taking differently: Take 20 mg by mouth daily. 08/24/19   Tower, Audrie GallusMarne A, MD  metoprolol succinate (TOPROL-XL) 100 MG 24 hr tablet Take 1 tablet (100 mg total) by mouth daily. Take with or immediately following a meal. 07/04/19 11/27/19  Rosalio MacadamiaGerhardt, Lori C, NP  mirtazapine (REMERON) 15 MG tablet TAKE 1 TABLET BY MOUTH AT BEDTIME Patient taking differently: Take 15 mg by mouth at bedtime as needed (sleep). 05/02/19   Tower, Audrie GallusMarne A, MD  Multiple Vitamin (MULTIVITAMIN WITH MINERALS) TABS tablet Take 1 tablet by mouth daily. 12/26/19   Alwyn RenMathews, Elizabeth G, MD  omeprazole (PRILOSEC) 20 MG capsule Take 1 capsule by mouth once daily 03/01/20   Tower, Audrie GallusMarne A, MD  ondansetron (ZOFRAN) 4 MG tablet Take 1 tablet (4 mg total) by mouth every 6 (six) hours as needed for nausea. 12/26/19   Alwyn RenMathews, Elizabeth G, MD  sertraline (ZOLOFT) 50 MG tablet Take 100 mg by mouth daily.    [provider]  sucralfate (CARAFATE) 1 GM/10ML suspension Take 10 mLs (1 g total) by mouth 4 (four) times daily. Patient taking differently: Take 1 g by mouth 4 (four) times daily as needed (For constipation). 06/21/19   Armbruster, Willaim RayasSteven P, MD  thiamine 100 MG tablet Take 1  tablet (100 mg total) by mouth daily. Patient not taking: No sig reported 12/26/19   Alwyn RenMathews, Elizabeth G, MD    Physical Exam:  Constitutional: Middle-aged female who appears to be in distress Vitals:   04/16/20 1115 04/16/20 1130 04/16/20 1238 04/16/20 1245  BP: (!) 150/91 (!) 145/91 (!) 144/75 139/73  Pulse: 81 81 84 84  Resp: 16 14 16 15   Temp:      TempSrc:      SpO2: 99% 97% 98% 98%  Weight:      Height:       Eyes: PERRL, lids and conjunctivae normal ENMT: Mucous membranes are moist. Posterior pharynx clear of any exudate or lesions.  Neck: normal, supple, no masses, no thyromegaly Respiratory: clear to auscultation bilaterally, no wheezing, no crackles. Normal respiratory effort. No accessory muscle  use.  Cardiovascular: Regular rate and rhythm, no murmurs / rubs / gallops. No extremity edema. 2+ pedal pulses. No carotid bruits.  Tenderness to palpation at the chest wall with palpation. Abdomen: no tenderness, no masses palpated. No hepatosplenomegaly. Bowel sounds positive.  Musculoskeletal: no clubbing / cyanosis. No joint deformity upper and lower extremities. Good ROM, no contractures. Normal muscle tone.  Skin: no rashes, lesions, ulcers. No induration Neurologic: CN 2-12 grossly intact.  Tremulous.  Strength 5/5 in all 4.  Psychiatric: Normal judgment and insight. Alert and oriented x 3. Normal mood.     Labs on Admission: I have personally reviewed following labs and imaging studies  CBC: Recent Labs  Lab 04/16/20 0735  WBC 5.1  HGB 10.4*  HCT 32.1*  MCV 83.2  PLT 228   Basic Metabolic Panel: Recent Labs  Lab 04/16/20 0735 04/16/20 1035  NA 136  --   K 4.0  --   CL 98  --   CO2 23  --   GLUCOSE 106*  --   BUN 11  --   CREATININE 0.93  --   CALCIUM 9.1  --   MG  --  1.6*   GFR: Estimated Creatinine Clearance: 73.8 mL/min (by C-G formula based on SCr of 0.93 mg/dL). Liver Function Tests: Recent Labs  Lab 04/16/20 0735  AST 127*  ALT 44   ALKPHOS 48  BILITOT 1.1  PROT 7.8  ALBUMIN 4.4   No results for input(s): LIPASE, AMYLASE in the last 168 hours. No results for input(s): AMMONIA in the last 168 hours. Coagulation Profile: No results for input(s): INR, PROTIME in the last 168 hours. Cardiac Enzymes: No results for input(s): CKTOTAL, CKMB, CKMBINDEX, TROPONINI in the last 168 hours. BNP (last 3 results) No results for input(s): PROBNP in the last 8760 hours. HbA1C: No results for input(s): HGBA1C in the last 72 hours. CBG: No results for input(s): GLUCAP in the last 168 hours. Lipid Profile: No results for input(s): CHOL, HDL, LDLCALC, TRIG, CHOLHDL, LDLDIRECT in the last 72 hours. Thyroid Function Tests: No results for input(s): TSH, T4TOTAL, FREET4, T3FREE, THYROIDAB in the last 72 hours. Anemia Panel: No results for input(s): VITAMINB12, FOLATE, FERRITIN, TIBC, IRON, RETICCTPCT in the last 72 hours. Urine analysis:    Component Value Date/Time   COLORURINE AMBER (A) 11/27/2019 1012   APPEARANCEUR CLOUDY (A) 11/27/2019 1012   LABSPEC 1.024 11/27/2019 1012   PHURINE 5.0 11/27/2019 1012   GLUCOSEU NEGATIVE 11/27/2019 1012   HGBUR SMALL (A) 11/27/2019 1012   BILIRUBINUR NEGATIVE 11/27/2019 1012   BILIRUBINUR trace 12/20/2012 1553   KETONESUR 5 (A) 11/27/2019 1012   PROTEINUR >=300 (A) 11/27/2019 1012   UROBILINOGEN 0.2 06/22/2014 1147   NITRITE NEGATIVE 11/27/2019 1012   LEUKOCYTESUR LARGE (A) 11/27/2019 1012   Sepsis Labs: No results found for this or any previous visit (from the past 240 hour(s)).   Radiological Exams on Admission: DG Chest Portable 1 View  Result Date: 04/16/2020 CLINICAL DATA:  Chest pain EXAM: PORTABLE CHEST 1 VIEW COMPARISON:  February 25, 2020 chest radiograph and chest CT FINDINGS: Lungs are clear. Heart size and pulmonary vascularity are within normal limits. No adenopathy. No bone lesions. IMPRESSION: Lungs clear.  Cardiac silhouette within normal limits. Electronically Signed    By: Bretta Bang III M.D.   On: 04/16/2020 08:07   CT Angio Chest Aorta w/CM &/OR wo/CM  Result Date: 04/16/2020 CLINICAL DATA:  Chest pain EXAM: CT ANGIOGRAPHY CHEST WITH CONTRAST TECHNIQUE: Initially,  axial CT images were obtained through the chest without intravenous contrast administration. Multidetector CT imaging of the chest was performed using the standard protocol during bolus administration of intravenous contrast. Multiplanar CT image reconstructions and MIPs were obtained to evaluate the vascular anatomy. CONTRAST:  OMNIPAQUE IOHEXOL 350 MG/ML SOLN COMPARISON:  Chest radiograph April 16 2020; chest CT February 25, 2020 FINDINGS: Cardiovascular: There is no intramural hematoma evident on noncontrast enhanced study. There is no evident mediastinal hematoma. No thoracic aortic aneurysm or dissection evident. There are occasional foci of calcification in the aorta. There are several foci of calcification in coronary arteries. Major venous structures appear patent. No aneurysm or dissection involving visualized great vessels. No appreciable atherosclerotic plaque or ulceration noted in the visualized great vessels. No aortic ulceration noted. No pericardial effusion or pericardial thickening. No evident pulmonary embolus. Mediastinum/Nodes: Visualized thyroid appears normal. No evident thoracic adenopathy. No esophageal lesions are appreciable. Lungs/Pleura: Lungs are clear. No evident pleural effusion. No evident pneumothorax. Trachea and major bronchial structures appear patent. Upper Abdomen: There is hepatic steatosis. Visualized upper abdominal structures otherwise appear unremarkable. Musculoskeletal: No blastic or lytic bone lesions. Subchondral cystic change noted in the left shoulder region, stable. No chest wall lesions evident. Review of the MIP images confirms the above findings. IMPRESSION: 1. No thoracic aortic aneurysm or dissection. No intramural hematoma or plaque ulceration.  Mild thoracic aortic atherosclerosis evident. Visualized great vessels appear unremarkable. 2.  No evident pulmonary embolus. 3.  Lungs clear. 4.  No evident adenopathy. 5.  Hepatic steatosis again noted. Aortic Atherosclerosis (ICD10-I70.0). Electronically Signed   By: Bretta Bang III M.D.   On: 04/16/2020 12:16    EKG: Independently reviewed.  Sinus rhythm at 85 bpm   Assessment/Plan Alcohol alcohol abuse with acute withdrawals: Patient presents with complaints of recently trying to quit alcohol on on her own.  Last drink was 2 days ago and on physical exam patient is tremulous.  She had been drinking approximately 1 pint of whiskey per day on average since the passing of her father at the end of last year.  -Admit to medical telemetry bed -CIWA protocols with scheduled Ativan and as needed -Thiamine, folate, multivitamin -Transitions of care consult.  Patient request that she talk with behavioral health prior to being discharged  Hypertensive urgency: On admission weighted up to 193/88.  Home blood pressure regimen includes amlodipine 10 mg daily and lisinopril 20 mg daily, and metoprolol succinate 100 mg daily. -Continue home regimen as tolerated -Labetalol IV as needed for elevated blood pressure greater than 180  Chest pain, atypical: Acute.  Patient reports having severe substernal and left-sided chest pain.  On physical exam noted to have tenderness to palpation.  High-sensitivity troponins negative x2 and EKG without significant ischemic changes.  Question possible costochondritis. -Continue to monitor  Urinary tract infection: Acute.  Urinalysis positive for moderate leukocytes, positive nitrites, rare bacteria, and 21-50 WBCs. -Check urine culture -Empiric antibiotics of Rocephin IV  Abdominal pain nausea and vomiting: Acute.  Patient reports having epigastric and left upper quadrant abdominal pain with nausea vomiting.  Reports a prior history of esophageal ulcer.  Question  possibility of gastritis. -Add-on lipase -Advance diet as tolerated -Antiemetics as needed -Protonix IV  Normocytic anemia: Hemoglobin 10.4 g/dL which appears near patient's baseline having some minimal blood with wiping. -Continue to monitor  Hypomagnesemia: Acute.  Magnesium 1.6 on admission. -Give 2 g of magnesium sulfate IV -Continue to monitor and replace as needed  Transaminitis: Acute  on chronic.  AST 137 with ALT 44.  The AST to ALT ratio consistent with alcohol abuse. -Continue to monitor  GERD: Home regimen includes omeprazole 20 mg daily. -Protonix IV twice daily -Schedule Carafate   DVT prophylaxis: Lovenox Code Status: Full Family Communication: No family requested to be updated at this time Disposition Plan: Likely discharge home once medically Consults called: None Admission status: Observation  Clydie Braun MD [facility]   If 7PM-7AM, please contact night-coverage   04/16/2020, 1:15 PM

## 2020-04-16 NOTE — ED Notes (Signed)
Pt vomited and stated that nausea medicine was not working and that her head and chest were "killing her". RN notified.

## 2020-04-17 DIAGNOSIS — I160 Hypertensive urgency: Secondary | ICD-10-CM

## 2020-04-17 DIAGNOSIS — N390 Urinary tract infection, site not specified: Secondary | ICD-10-CM

## 2020-04-17 DIAGNOSIS — F10230 Alcohol dependence with withdrawal, uncomplicated: Secondary | ICD-10-CM

## 2020-04-17 DIAGNOSIS — R7989 Other specified abnormal findings of blood chemistry: Secondary | ICD-10-CM

## 2020-04-17 LAB — CBC
HCT: 30 % — ABNORMAL LOW (ref 36.0–46.0)
Hemoglobin: 9.5 g/dL — ABNORMAL LOW (ref 12.0–15.0)
MCH: 27.1 pg (ref 26.0–34.0)
MCHC: 31.7 g/dL (ref 30.0–36.0)
MCV: 85.5 fL (ref 80.0–100.0)
Platelets: 173 10*3/uL (ref 150–400)
RBC: 3.51 MIL/uL — ABNORMAL LOW (ref 3.87–5.11)
RDW: 20.8 % — ABNORMAL HIGH (ref 11.5–15.5)
WBC: 4.6 10*3/uL (ref 4.0–10.5)
nRBC: 0 % (ref 0.0–0.2)

## 2020-04-17 LAB — PHOSPHORUS: Phosphorus: 1.4 mg/dL — ABNORMAL LOW (ref 2.5–4.6)

## 2020-04-17 LAB — COMPREHENSIVE METABOLIC PANEL
ALT: 37 U/L (ref 0–44)
AST: 113 U/L — ABNORMAL HIGH (ref 15–41)
Albumin: 4.3 g/dL (ref 3.5–5.0)
Alkaline Phosphatase: 45 U/L (ref 38–126)
Anion gap: 10 (ref 5–15)
BUN: 7 mg/dL (ref 6–20)
CO2: 26 mmol/L (ref 22–32)
Calcium: 8.8 mg/dL — ABNORMAL LOW (ref 8.9–10.3)
Chloride: 97 mmol/L — ABNORMAL LOW (ref 98–111)
Creatinine, Ser: 0.88 mg/dL (ref 0.44–1.00)
GFR, Estimated: >60 mL/min (ref >60)
Glucose, Bld: 86 mg/dL (ref 70–99)
Potassium: 3.6 mmol/L (ref 3.5–5.1)
Sodium: 133 mmol/L — ABNORMAL LOW (ref 135–145)
Total Bilirubin: 1.3 mg/dL — ABNORMAL HIGH (ref 0.3–1.2)
Total Protein: 7.5 g/dL (ref 6.5–8.1)

## 2020-04-17 LAB — MAGNESIUM: Magnesium: 2.2 mg/dL (ref 1.7–2.4)

## 2020-04-17 MED ORDER — FOLIC ACID 1 MG PO TABS
1.0000 mg | ORAL_TABLET | Freq: Once | ORAL | Status: AC
  Administered 2020-04-18: 1 mg via ORAL
  Filled 2020-04-17: qty 1

## 2020-04-17 MED ORDER — THIAMINE HCL 100 MG PO TABS
100.0000 mg | ORAL_TABLET | Freq: Every day | ORAL | Status: DC
  Administered 2020-04-17 – 2020-04-19 (×3): 100 mg via ORAL
  Filled 2020-04-17 (×3): qty 1

## 2020-04-17 MED ORDER — CHLORDIAZEPOXIDE HCL 25 MG PO CAPS: 25.0000 mg | ORAL_CAPSULE | Freq: Four times a day (QID) | ORAL | Status: DC

## 2020-04-17 MED ORDER — CHLORDIAZEPOXIDE HCL 25 MG PO CAPS
25.0000 mg | ORAL_CAPSULE | Freq: Three times a day (TID) | ORAL | Status: DC
  Administered 2020-04-17 – 2020-04-19 (×5): 25 mg via ORAL
  Filled 2020-04-17 (×5): qty 1

## 2020-04-17 MED ORDER — SODIUM CHLORIDE 0.9 % IV SOLN
1.0000 mg | Freq: Once | INTRAVENOUS | Status: AC
  Filled 2020-04-17: qty 0.2

## 2020-04-17 MED ORDER — LACTATED RINGERS IV SOLN: INTRAVENOUS | Status: DC

## 2020-04-17 MED ORDER — THIAMINE HCL 100 MG/ML IJ SOLN: 100.0000 mg | Freq: Every day | INTRAMUSCULAR | Status: DC

## 2020-04-17 MED ORDER — POTASSIUM PHOSPHATES 15 MMOLE/5ML IV SOLN
30.0000 mmol | Freq: Once | INTRAVENOUS | Status: AC
  Administered 2020-04-17: 30 mmol via INTRAVENOUS
  Filled 2020-04-17: qty 10

## 2020-04-17 NOTE — Progress Notes (Signed)
CSW received call from June with [facility] stating that she had received a referral on the patient for inpatient admission. She stated patient had been accepted and can arrive on Thursday morning. CSW explained that patient is not medically stable yet but will speak with patient tomorrow when she is more medically appropriate and have her call June. Patient would need to be at [facility] by 9am from the hospital (or 7:45am from home). Otherwise they cannot accept patient until Monday. She will need a 30 day supply of medications and a refill.  Daelyn Mozer LCSW

## 2020-04-17 NOTE — Progress Notes (Signed)
Placed call to Rapid Response Team let them know the situation, told them doctor had declined recommendation for precedex earlier. Patient is still sitting up and trying to get around the room. VVS. Redirectible albeit every couple of minutes.

## 2020-04-17 NOTE — Progress Notes (Signed)
PROGRESS NOTE                                                                             PROGRESS NOTE                                                                                                                                                                                                             Patient Demographics:    Nicole Hughes, is a 50 y.o. female, DOB - Feb 11, 1970, IRC:[id number]  Outpatient Primary MD for the patient is [facility], Audrie Gallus, MD    LOS - 0  Admit date - 04/16/2020    Chief Complaint  Patient presents with  . Chest Pain       Brief Narrative   50 y.o. female with medical history significant of hypertension, anxiety, depression, esophageal ulcer, headaches, alcohol abuse, history of DVT/PE following childbirth, and GERD presents with complaints of severe chest pain since around 6:30 a.m.  Chest  Chest pain is located substernally and off to the left side and into her neck.  Pain is also noted to be pleuritic in nature.  She is a recovering alcoholic, but relapsed after the passing of her father at the end of last year.  She had been drinking a pint of whiskey per day on average up until 2 days ago when she tried to quit on her own.  However, she feels that she is unable to do so on her own at this time.  Reports having associated symptoms of nausea, vomiting, headache, epigastric/ left upper quadrant abdominal pain, constant low back pain, paresthesias all over, shortness of breath, insomnia unable to sleep for the last 4 days, and weight loss of at least 30 pounds. She was admitted for alcohol withdrawel      Subjective:    Nicole Hughes today with withdrawals this morning, during IV Ativan multiple doses.   Assessment  & Plan :    Principal Problem:   Alcohol withdrawal (HCC) Active Problems:   Epigastric pain   GERD (  gastroesophageal reflux disease)   Elevated LFTs   Anemia   Hypomagnesemia   Acute lower UTI    Hypertensive urgency  Alcohol alcohol abuse with acute withdrawals: -  Patient presents with complaints of recently trying to quit alcohol on on her own.  Last drink was 2 days ago and on physical exam patient is tremulous.  She had been drinking approximately 1 pint of whiskey per day on average since the passing of her father at the end of last year.  -She is currently in alcohol withdrawal with DTs -New CIWA stepdown protocol, I will add scheduled Librium 25 mg 4 times daily x4 doses total -Thiamine, folate, multivitamin -Transitions of care consult.  Patient request that she talk with behavioral health prior to being discharged, [facility] Thursday if she stable by then.  Hypertensive urgency:  - On admission weighted up to 193/88.  Home blood pressure regimen includes amlodipine 10 mg daily and lisinopril 20 mg daily, and metoprolol succinate 100 mg daily. -Continue home regimen as tolerated -Labetalol IV as needed for elevated blood pressure greater than 180 -Blood pressure has been better controlled after receiving IV Ativan.  Chest pain, atypical: -  This is musculoskeletal chest pain, Patient reports having severe substernal and left-sided chest pain.  On physical exam noted to have tenderness to palpation.  High-sensitivity troponins negative x2 and EKG without significant ischemic changes.  Question possible costochondritis. -Continue to monitor  Urinary tract infection:  -Acute.  Urinalysis positive for moderate leukocytes, positive nitrites, rare bacteria, and 21-50 WBCs. -Continue with IV Rocephin, follow urine cultures.  Abdominal pain nausea and vomiting -Lipase elevated at 93, which is around her baseline, she does not have any complaints of abdominal pain or nausea or vomiting today.   Normocytic anemia:  -Hemoglobin 10.4 g/dL which appears near patient's baseline having some minimal blood with wiping. -Continue to  monitor  Hypomagnesemia/hypophosphatemia -Continue to monitor closely and replete as needed.  Transaminitis: Acute on chronic.  AST 137 with ALT 44.  The AST to ALT ratio consistent with alcohol abuse. -Continue to monitor  GERD: Home regimen includes omeprazole 20 mg daily. -Protonix IV twice daily -Schedule Carafate    SpO2: 100 %  Recent Labs  Lab 04/16/20 0735 04/17/20 0226  WBC 5.1 4.6  PLT 228 173  AST 127* 113*  ALT 44 37  ALKPHOS 48 45  BILITOT 1.1 1.3*  ALBUMIN 4.4 4.3       ABG     Component Value Date/Time   TCO2 17 (L) 01/02/2017 1409          Condition - Extremely Guarded  Family Communication  : Tried to reach husband, unable to leave a Engineer, technical salesvoicemail as mailbox is full.  Code Status :  full  Consults  :  none  Disposition Plan  :    Status is: Inpatient  Remains inpatient appropriate because:IV treatments appropriate due to intensity of illness or inability to take PO   Dispo: The patient is from: Home              Anticipated d/c is to: Home              Patient currently is not medically stable to d/c.   Difficult to place patient No      DVT Prophylaxis  :  Lovenox  Lab Results  Component Value Date   PLT 173 04/17/2020    Diet :  Diet Order  Diet clear liquid Room service appropriate? Yes; Fluid consistency: Thin  Diet effective now                  Inpatient Medications  Scheduled Meds: . amLODipine  10 mg Oral Daily  . chlordiazePOXIDE  25 mg Oral QID  . dicyclomine  20 mg Oral BID  . enoxaparin (LOVENOX) injection  40 mg Subcutaneous Q24H  . folic acid  1 mg Oral Daily  . lisinopril  20 mg Oral Daily  . LORazepam  0-4 mg Intravenous Q6H   Followed by  . [START ON 04/18/2020] LORazepam  0-4 mg Intravenous Q12H  . metoprolol succinate  100 mg Oral Daily  . multivitamin with minerals  1 tablet Oral Daily  . pantoprazole (PROTONIX) IV  40 mg Intravenous Q12H  . sertraline  100 mg Oral Daily  .  sodium chloride flush  3 mL Intravenous Q12H  . sucralfate  1 g Oral QID   Continuous Infusions: . cefTRIAXone (ROCEPHIN)  IV 1 g (04/16/20 1659)  . potassium PHOSPHATE IVPB (in mmol) 30 mmol (04/17/20 0847)   PRN Meds:.acetaminophen **OR** acetaminophen, albuterol, labetalol, LORazepam **OR** LORazepam, mirtazapine, ondansetron  Antibiotics  :    Anti-infectives (From admission, onward)   Start     Dose/Rate Route Frequency Ordered Stop   04/16/20 1645  cefTRIAXone (ROCEPHIN) 1 g in sodium chloride 0.9 % 100 mL IVPB        1 g 200 mL/hr over 30 Minutes Intravenous Every 24 hours 04/16/20 1550          Mliss Fritz Annmargaret Decaprio M.D on 04/17/2020 at 2:18 PM  To page go to [email or web address]   [facility] -  Office  [phone]        Objective:   Vitals:   04/17/20 0318 04/17/20 0735 04/17/20 0900 04/17/20 1200  BP: 140/84 (!) 176/102 (!) 150/95 133/66  Pulse: 81 97 98 86  Resp: 20 18 20 16   Temp: 98.6 F (37 C) 98.1 F (36.7 C)  98.2 F (36.8 C)  TempSrc: Oral Oral  Oral  SpO2: 100% 100% 100% 100%  Weight:      Height:        Wt Readings from Last 3 Encounters:  04/16/20 72.6 kg  02/25/20 72.6 kg  02/22/20 72.6 kg     Intake/Output Summary (Last 24 hours) at 04/17/2020 1418 Last data filed at 04/16/2020 2212 Gross per 24 hour  Intake 240 ml  Output --  Net 240 ml     Physical Exam   Awake , anxious , confused with tremors. Symmetrical Chest wall movement, Good air movement bilaterally, CTAB RRR,No Gallops,Rubs or new Murmurs, No Parasternal Heave +ve B.Sounds, Abd Soft, No tenderness,No rebound - guarding or rigidity. No Cyanosis, Clubbing or edema, No new Rash or bruise     Data Review:    CBC Recent Labs  Lab 04/16/20 0735 04/17/20 0226  WBC 5.1 4.6  HGB 10.4* 9.5*  HCT 32.1* 30.0*  PLT 228 173  MCV 83.2 85.5  MCH 26.9 27.1  MCHC 32.4 31.7  RDW 20.8* 20.8*    Recent Labs  Lab 04/16/20 0735 04/16/20 1035 04/17/20 0226  NA 136   --  133*  K 4.0  --  3.6  CL 98  --  97*  CO2 23  --  26  GLUCOSE 106*  --  86  BUN 11  --  7  CREATININE 0.93  --  0.88  CALCIUM 9.1  --  8.8*  AST 127*  --  113*  ALT 44  --  37  ALKPHOS 48  --  45  BILITOT 1.1  --  1.3*  ALBUMIN 4.4  --  4.3  MG  --  1.6* 2.2    ------------------------------------------------------------------------------------------------------------------ No results for input(s): CHOL, HDL, LDLCALC, TRIG, CHOLHDL, LDLDIRECT in the last 72 hours.  No results found for: HGBA1C ------------------------------------------------------------------------------------------------------------------ No results for input(s): TSH, T4TOTAL, T3FREE, THYROIDAB in the last 72 hours.  Invalid input(s): FREET3  Cardiac Enzymes No results for input(s): CKMB, TROPONINI, MYOGLOBIN in the last 168 hours.  Invalid input(s): CK ------------------------------------------------------------------------------------------------------------------ No results found for: BNP  Micro Results No results found for this or any previous visit (from the past 240 hour(s)).  Radiology Reports DG Chest Portable 1 View  Result Date: 04/16/2020 CLINICAL DATA:  Chest pain EXAM: PORTABLE CHEST 1 VIEW COMPARISON:  February 25, 2020 chest radiograph and chest CT FINDINGS: Lungs are clear. Heart size and pulmonary vascularity are within normal limits. No adenopathy. No bone lesions. IMPRESSION: Lungs clear.  Cardiac silhouette within normal limits. Electronically Signed   By: Bretta Bang III M.D.   On: 04/16/2020 08:07   CT Angio Chest Aorta w/CM &/OR wo/CM  Result Date: 04/16/2020 CLINICAL DATA:  Chest pain EXAM: CT ANGIOGRAPHY CHEST WITH CONTRAST TECHNIQUE: Initially, axial CT images were obtained through the chest without intravenous contrast administration. Multidetector CT imaging of the chest was performed using the standard protocol during bolus administration of intravenous contrast.  Multiplanar CT image reconstructions and MIPs were obtained to evaluate the vascular anatomy. CONTRAST:  OMNIPAQUE IOHEXOL 350 MG/ML SOLN COMPARISON:  Chest radiograph April 16 2020; chest CT February 25, 2020 FINDINGS: Cardiovascular: There is no intramural hematoma evident on noncontrast enhanced study. There is no evident mediastinal hematoma. No thoracic aortic aneurysm or dissection evident. There are occasional foci of calcification in the aorta. There are several foci of calcification in coronary arteries. Major venous structures appear patent. No aneurysm or dissection involving visualized great vessels. No appreciable atherosclerotic plaque or ulceration noted in the visualized great vessels. No aortic ulceration noted. No pericardial effusion or pericardial thickening. No evident pulmonary embolus. Mediastinum/Nodes: Visualized thyroid appears normal. No evident thoracic adenopathy. No esophageal lesions are appreciable. Lungs/Pleura: Lungs are clear. No evident pleural effusion. No evident pneumothorax. Trachea and major bronchial structures appear patent. Upper Abdomen: There is hepatic steatosis. Visualized upper abdominal structures otherwise appear unremarkable. Musculoskeletal: No blastic or lytic bone lesions. Subchondral cystic change noted in the left shoulder region, stable. No chest wall lesions evident. Review of the MIP images confirms the above findings. IMPRESSION: 1. No thoracic aortic aneurysm or dissection. No intramural hematoma or plaque ulceration. Mild thoracic aortic atherosclerosis evident. Visualized great vessels appear unremarkable. 2.  No evident pulmonary embolus. 3.  Lungs clear. 4.  No evident adenopathy. 5.  Hepatic steatosis again noted. Aortic Atherosclerosis (ICD10-I70.0). Electronically Signed   By: Bretta Bang III M.D.   On: 04/16/2020 12:16

## 2020-04-18 DIAGNOSIS — F10231 Alcohol dependence with withdrawal delirium: Principal | ICD-10-CM

## 2020-04-18 MED ORDER — LABETALOL HCL 5 MG/ML IV SOLN: 5.0000 mg | INTRAVENOUS | Status: DC | PRN

## 2020-04-18 MED ORDER — ASPIRIN-ACETAMINOPHEN-CAFFEINE 250-250-65 MG PO TABS
1.0000 | ORAL_TABLET | Freq: Three times a day (TID) | ORAL | Status: DC | PRN
  Administered 2020-04-18 (×2): 1 via ORAL
  Filled 2020-04-18 (×4): qty 1

## 2020-04-18 NOTE — Care Management (Addendum)
MATCH entered for dates 4/14 to 4/22 due to patient being admitted for almost 2 days without any insurance coverage listed and needed prescriptions filled to go to [facility].

## 2020-04-18 NOTE — Progress Notes (Signed)
PROGRESS NOTE                                                                             PROGRESS NOTE                                                                                                                                                                                                             Patient Demographics:    Nicole Hughes, is a 50 y.o. female, DOB - [birth year], FIE:[id number]  Outpatient Primary MD for the patient is [facility], Audrie Gallus, MD    LOS - 1  Admit date - 04/16/2020    Chief Complaint  Patient presents with  . Chest Pain       Brief Narrative : 50 y.o. female with medical history significant of hypertension, anxiety, depression, esophageal ulcer, headaches, alcohol abuse, history of DVT/PE following childbirth, and GERD presented with complaints of chest pain since around 6:30 a.m. Chest pain is located substernally and off to the left side and into her neck.  Pain is also noted to be pleuritic in nature.  She is a recovering alcoholic, but relapsed after passing of her father at the end of last year.  She had been drinking a pint of whiskey per day on average up until 2 days ago when she tried to quit on her own.  However, she feels that she is unable to do so on her own at this time.  Reports having associated symptoms of nausea, vomiting, headache, epigastric/ left upper quadrant abdominal pain, constant low back pain, paresthesias all over, shortness of breath, insomnia unable to sleep for the last 4 days, and weight loss of at least 30 pounds. She was admitted for alcohol withdrawel      Subjective:    [name] today feels less tremulous.  She was able to get some rest.  Has some neck pain and headache.  Denies any nausea vomiting or chest pain.  Not immobilized. She has only needed 1 dose of Ativan overnight after starting on  chlordiazepoxide.  She does look forward to go to rehab.   Assessment  & Plan :     Principal Problem:   Alcohol withdrawal (HCC) Active Problems:   Epigastric pain   GERD (gastroesophageal reflux disease)   Elevated LFTs   Anemia   Hypomagnesemia   Acute lower UTI   Hypertensive urgency  Alcohol alcohol abuse with acute withdrawals: Stabilizing today.  Treated with high-dose CIWA scale.  Started on chlordiazepoxide since 4/12 and need for Ativan has improved. Continue monitoring.  Delirium precautions.  Fall precautions.  Continue telemetry sitter for safety. Hopefully, with oral chlordiazepoxide, we can reduce the use of Ativan.  Start mobilizing with supervision. Thiamine folic acid and multivitamins. If remains a stable, will [facility].  Hypertensive urgency: Blood pressure is stable after resuming home medications.  Continue monitoring.  Atypical chest pain: Probably musculoskeletal chest pain.  Acute coronary syndrome ruled out.  Serial troponins and EKG nonischemic.  Acute UTI present on admission: Growing E. coli.  On Rocephin.  Continue until final cultures.  Hypomagnesemia/hypophosphatemia: This was replaced.  Recheck tomorrow morning.  Transaminitis: Acute on chronic.  AST 137 with ALT 44.  The AST to ALT ratio consistent with alcohol abuse. -Continue to monitor  GERD: Home regimen includes omeprazole 20 mg daily. -Protonix IV twice daily, will change to oral on discharge. -Schedule Carafate    SpO2: 99 %  Recent Labs  Lab 04/16/20 0735 04/17/20 0226  WBC 5.1 4.6  PLT 228 173  AST 127* 113*  ALT 44 37  ALKPHOS 48 45  BILITOT 1.1 1.3*  ALBUMIN 4.4 4.3       ABG     Component Value Date/Time   TCO2 17 (L) 01/02/2017 1409          Condition -fair.  Family Communication  : Called patient's husband, unable to even leave a Engineer, technical salesvoicemail.  Code Status :  full  Consults  :  none  Disposition Plan  : Discharge home to go straight to  inpatient alcohol rehab.  Anticipate tomorrow morning.  Status is: Inpatient  Remains inpatient appropriate because:IV treatments appropriate due to intensity of illness or inability to take PO   Dispo: The patient is from: Home              Anticipated d/c is to: Home, alcohol rehab               Patient currently is not medically stable to d/c.   Difficult to place patient No      DVT Prophylaxis  :  Lovenox  Lab Results  Component Value Date   PLT 173 04/17/2020    Diet :  Diet Order            Diet heart healthy/carb modified Room service appropriate? Yes; Fluid consistency: Thin  Diet effective now                  Inpatient Medications  Scheduled Meds: . amLODipine  10 mg Oral Daily  . chlordiazePOXIDE  25 mg Oral TID  . dicyclomine  20 mg Oral BID  . enoxaparin (LOVENOX) injection  40 mg Subcutaneous Q24H  . folic acid  1 mg Oral Once  . lisinopril  20 mg Oral Daily  . LORazepam  0-4 mg Intravenous Q6H   Followed by  . LORazepam  0-4 mg Intravenous Q12H  . metoprolol succinate  100 mg  Oral Daily  . multivitamin with minerals  1 tablet Oral Daily  . pantoprazole (PROTONIX) IV  40 mg Intravenous Q12H  . sertraline  100 mg Oral Daily  . sodium chloride flush  3 mL Intravenous Q12H  . sucralfate  1 g Oral QID  . thiamine injection  100 mg Intravenous Daily   Or  . thiamine  100 mg Oral Daily   Continuous Infusions: . cefTRIAXone (ROCEPHIN)  IV 1 g (04/17/20 1508)  . folic acid (FOLVITE) IVPB    . lactated ringers 75 mL/hr at 04/18/20 0537   PRN Meds:.acetaminophen **OR** acetaminophen, albuterol, labetalol, LORazepam **OR** LORazepam, mirtazapine, ondansetron  Antibiotics  :    Anti-infectives (From admission, onward)   Start     Dose/Rate Route Frequency Ordered Stop   04/16/20 1645  cefTRIAXone (ROCEPHIN) 1 g in sodium chloride 0.9 % 100 mL IVPB        1 g 200 mL/hr over 30 Minutes Intravenous Every 24 hours 04/16/20 1550          Dorcas Carrow M.D on 04/18/2020 at 9:44 AM  To page go to [email or web address]   [facility] -  Office  [phone]  Total time spent on patient care: 30 minutes      Objective:   Vitals:   04/17/20 0900 04/17/20 1200 04/17/20 2258 04/18/20 0211  BP: (!) 150/95 133/66 137/83 137/85  Pulse: 98 86 80 71  Resp: 20 16 17 18   Temp:  98.2 F (36.8 C) 98 F (36.7 C) 98 F (36.7 C)  TempSrc:  Oral Oral Oral  SpO2: 100% 100% 100% 99%  Weight:      Height:        Wt Readings from Last 3 Encounters:  04/16/20 72.6 kg  02/25/20 72.6 kg  02/22/20 72.6 kg     Intake/Output Summary (Last 24 hours) at 04/18/2020 0944 Last data filed at 04/18/2020 0350 Gross per 24 hour  Intake 240 ml  Output --  Net 240 ml     Physical Exam  General: Looks comfortable.  Sleepy but able to have communication.  Slightly anxious otherwise alert oriented. Cardiovascular: S1-S2 normal.  No added sounds. Respiratory: No added sounds. Gastrointestinal: Soft.  Nontender.  Bowel sounds present. Ext: No cyanosis or edema. Neuro: Alert oriented x4.  No focal deficits.    Data Review:    CBC Recent Labs  Lab 04/16/20 0735 04/17/20 0226  WBC 5.1 4.6  HGB 10.4* 9.5*  HCT 32.1* 30.0*  PLT 228 173  MCV 83.2 85.5  MCH 26.9 27.1  MCHC 32.4 31.7  RDW 20.8* 20.8*    Recent Labs  Lab 04/16/20 0735 04/16/20 1035 04/17/20 0226  NA 136  --  133*  K 4.0  --  3.6  CL 98  --  97*  CO2 23  --  26  GLUCOSE 106*  --  86  BUN 11  --  7  CREATININE 0.93  --  0.88  CALCIUM 9.1  --  8.8*  AST 127*  --  113*  ALT 44  --  37  ALKPHOS 48  --  45  BILITOT 1.1  --  1.3*  ALBUMIN 4.4  --  4.3  MG  --  1.6* 2.2    ------------------------------------------------------------------------------------------------------------------ No results for input(s): CHOL, HDL, LDLCALC, TRIG, CHOLHDL, LDLDIRECT in the last 72 hours.  No results found for:  HGBA1C ------------------------------------------------------------------------------------------------------------------ No results for input(s): TSH, T4TOTAL, T3FREE, THYROIDAB in the last 72 hours.  Invalid input(s): FREET3  Cardiac Enzymes No results for input(s): CKMB, TROPONINI, MYOGLOBIN in the last 168 hours.  Invalid input(s): CK ------------------------------------------------------------------------------------------------------------------ No results found for: BNP  Micro Results Recent Results (from the past 240 hour(s))  Culture, Urine     Status: Abnormal (Preliminary result)   Collection Time: 04/16/20  1:15 PM   Specimen: Urine, Random  Result Value Ref Range Status   Specimen Description URINE, RANDOM  Final   Special Requests   Final    NONE Performed at [facility], [address] [place], [place] [address]    Culture >=100,000 COLONIES/mL GRAM NEGATIVE RODS (A)  Final   Report Status PENDING  Incomplete    Radiology Reports DG Chest Portable 1 View  Result Date: 04/16/2020 CLINICAL DATA:  Chest pain EXAM: PORTABLE CHEST 1 VIEW COMPARISON:  February 25, 2020 chest radiograph and chest CT FINDINGS: Lungs are clear. Heart size and pulmonary vascularity are within normal limits. No adenopathy. No bone lesions. IMPRESSION: Lungs clear.  Cardiac silhouette within normal limits. Electronically Signed   By: Bretta Bang III M.D.   On: 04/16/2020 08:07   CT Angio Chest Aorta w/CM &/OR wo/CM  Result Date: 04/16/2020 CLINICAL DATA:  Chest pain EXAM: CT ANGIOGRAPHY CHEST WITH CONTRAST TECHNIQUE: Initially, axial CT images were obtained through the chest without intravenous contrast administration. Multidetector CT imaging of the chest was performed using the standard protocol during bolus administration of intravenous contrast. Multiplanar CT image reconstructions and MIPs were obtained to evaluate the vascular anatomy. CONTRAST:  OMNIPAQUE IOHEXOL 350  MG/ML SOLN COMPARISON:  Chest radiograph April 16 2020; chest CT February 25, 2020 FINDINGS: Cardiovascular: There is no intramural hematoma evident on noncontrast enhanced study. There is no evident mediastinal hematoma. No thoracic aortic aneurysm or dissection evident. There are occasional foci of calcification in the aorta. There are several foci of calcification in coronary arteries. Major venous structures appear patent. No aneurysm or dissection involving visualized great vessels. No appreciable atherosclerotic plaque or ulceration noted in the visualized great vessels. No aortic ulceration noted. No pericardial effusion or pericardial thickening. No evident pulmonary embolus. Mediastinum/Nodes: Visualized thyroid appears normal. No evident thoracic adenopathy. No esophageal lesions are appreciable. Lungs/Pleura: Lungs are clear. No evident pleural effusion. No evident pneumothorax. Trachea and major bronchial structures appear patent. Upper Abdomen: There is hepatic steatosis. Visualized upper abdominal structures otherwise appear unremarkable. Musculoskeletal: No blastic or lytic bone lesions. Subchondral cystic change noted in the left shoulder region, stable. No chest wall lesions evident. Review of the MIP images confirms the above findings. IMPRESSION: 1. No thoracic aortic aneurysm or dissection. No intramural hematoma or plaque ulceration. Mild thoracic aortic atherosclerosis evident. Visualized great vessels appear unremarkable. 2.  No evident pulmonary embolus. 3.  Lungs clear. 4.  No evident adenopathy. 5.  Hepatic steatosis again noted. Aortic Atherosclerosis (ICD10-I70.0). Electronically Signed   By: Bretta Bang III M.D.   On: 04/16/2020 12:16

## 2020-04-18 NOTE — Progress Notes (Signed)
When talking about admission to [facility] patient informed admissions coordinator, June, that she has Nurse, learning disability. June then informed the patient that [facility] could not accept her as they only accept patients who are uninsured.CSW verified this with June. CSW then discussed additional options with the patient, including [facility] in [place] and local outpatient programs. Patient indicated that she does not want to be so far from home and wanted to discuss further with her husband. CSW provided patient with a list of local substance use treatment resources, patient said that her husband would bring a copy of her insurance card to the hospital.

## 2020-04-18 NOTE — Plan of Care (Signed)

## 2020-04-19 LAB — CBC WITH DIFFERENTIAL/PLATELET
Abs Immature Granulocytes: 0.01 10*3/uL (ref 0.00–0.07)
Basophils Absolute: 0 10*3/uL (ref 0.0–0.1)
Basophils Relative: 1 %
Eosinophils Absolute: 0.1 10*3/uL (ref 0.0–0.5)
Eosinophils Relative: 4 %
HCT: 28.3 % — ABNORMAL LOW (ref 36.0–46.0)
Hemoglobin: 8.9 g/dL — ABNORMAL LOW (ref 12.0–15.0)
Immature Granulocytes: 0 %
Lymphocytes Relative: 40 %
Lymphs Abs: 1.1 10*3/uL (ref 0.7–4.0)
MCH: 26.6 pg (ref 26.0–34.0)
MCHC: 31.4 g/dL (ref 30.0–36.0)
MCV: 84.7 fL (ref 80.0–100.0)
Monocytes Absolute: 0.3 10*3/uL (ref 0.1–1.0)
Monocytes Relative: 11 %
Neutro Abs: 1.2 10*3/uL — ABNORMAL LOW (ref 1.7–7.7)
Neutrophils Relative %: 44 %
Platelets: 128 10*3/uL — ABNORMAL LOW (ref 150–400)
RBC: 3.34 MIL/uL — ABNORMAL LOW (ref 3.87–5.11)
RDW: 20.3 % — ABNORMAL HIGH (ref 11.5–15.5)
WBC: 2.8 10*3/uL — ABNORMAL LOW (ref 4.0–10.5)
nRBC: 0 % (ref 0.0–0.2)

## 2020-04-19 LAB — PHOSPHORUS: Phosphorus: 2.8 mg/dL (ref 2.5–4.6)

## 2020-04-19 LAB — URINE CULTURE: Culture: >=100000 — AB

## 2020-04-19 LAB — COMPREHENSIVE METABOLIC PANEL
ALT: 68 U/L — ABNORMAL HIGH (ref 0–44)
AST: 199 U/L — ABNORMAL HIGH (ref 15–41)
Albumin: 3.7 g/dL (ref 3.5–5.0)
Alkaline Phosphatase: 44 U/L (ref 38–126)
Anion gap: 7 (ref 5–15)
BUN: <5 mg/dL — ABNORMAL LOW (ref 6–20)
CO2: 25 mmol/L (ref 22–32)
Calcium: 8.9 mg/dL (ref 8.9–10.3)
Chloride: 102 mmol/L (ref 98–111)
Creatinine, Ser: 0.78 mg/dL (ref 0.44–1.00)
GFR, Estimated: >60 mL/min (ref >60)
Glucose, Bld: 121 mg/dL — ABNORMAL HIGH (ref 70–99)
Potassium: 2.9 mmol/L — ABNORMAL LOW (ref 3.5–5.1)
Sodium: 134 mmol/L — ABNORMAL LOW (ref 135–145)
Total Bilirubin: 0.8 mg/dL (ref 0.3–1.2)
Total Protein: 6.2 g/dL — ABNORMAL LOW (ref 6.5–8.1)

## 2020-04-19 LAB — MAGNESIUM: Magnesium: 1.5 mg/dL — ABNORMAL LOW (ref 1.7–2.4)

## 2020-04-19 MED ORDER — POTASSIUM CHLORIDE 10 MEQ/100ML IV SOLN
10.0000 meq | INTRAVENOUS | Status: AC
  Administered 2020-04-19 (×4): 10 meq via INTRAVENOUS
  Filled 2020-04-19 (×4): qty 100

## 2020-04-19 MED ORDER — MAGNESIUM SULFATE 2 GM/50ML IV SOLN
2.0000 g | Freq: Once | INTRAVENOUS | Status: AC
  Administered 2020-04-19: 2 g via INTRAVENOUS
  Filled 2020-04-19: qty 50

## 2020-04-19 MED ORDER — POTASSIUM CHLORIDE CRYS ER 20 MEQ PO TBCR
40.0000 meq | EXTENDED_RELEASE_TABLET | ORAL | Status: DC
  Administered 2020-04-19: 40 meq via ORAL
  Filled 2020-04-19: qty 2

## 2020-04-19 MED ORDER — MAGNESIUM OXIDE 400 (241.3 MG) MG PO TABS
400.0000 mg | ORAL_TABLET | Freq: Two times a day (BID) | ORAL | Status: DC
  Administered 2020-04-19: 400 mg via ORAL
  Filled 2020-04-19: qty 1

## 2020-04-19 MED ORDER — MAGNESIUM OXIDE 400 (241.3 MG) MG PO TABS: 400.0000 mg | ORAL_TABLET | Freq: Two times a day (BID) | ORAL | 0 refills | Status: AC

## 2020-04-19 MED ORDER — MAGNESIUM SULFATE 2 GM/50ML IV SOLN: 2.0000 g | Freq: Once | INTRAVENOUS | Status: DC

## 2020-04-19 MED ORDER — CEPHALEXIN 500 MG PO CAPS: 500.0000 mg | ORAL_CAPSULE | Freq: Three times a day (TID) | ORAL | 0 refills | Status: AC

## 2020-04-19 MED ORDER — CHLORDIAZEPOXIDE HCL 10 MG PO CAPS: ORAL_CAPSULE | ORAL | 0 refills | Status: DC

## 2020-04-19 NOTE — Plan of Care (Signed)

## 2020-04-19 NOTE — Discharge Summary (Signed)
Physician Discharge Summary  Nicole Hughes RKY:[id number] DOB: 01-09-1970 DOA: 04/16/2020  PCP: Judy Pimple, MD  Admit date: 04/16/2020 Discharge date: 04/19/2020  Admitted From: Home. Disposition:  Home   Recommendations for Outpatient Follow-up:  1. Follow up with PCP in 1-2 weeks 2. Please obtain BMP/CBC/ magnesium and phosphorus in one week.   Home Health:NA  Equipment/Devices:NA   Discharge Condition:stable   CODE STATUS:full code  Diet recommendation: low salt diet  Discharge Summary:  50 y.o.femalewith medical history significant ofhypertension, anxiety, depression, esophageal ulcer, headaches, alcohol abuse, history of DVT/PEfollowing childbirth, and GERD presented with complaints of chest pain since around 6:30 a.m. Chest pain is located substernally and off to the left side and into her neck. Pain is also noted to be pleuritic in nature. She is a recovering alcoholic, but relapsed after passing of her father at the end of last year. She had been drinking a pint of whiskey per day on average up until 2 days ago when she tried to quit on her own. However, she feels that she is unable to do so on her own at this time. Reports having associated symptoms of nausea, vomiting, headache, epigastric/left upper quadrant abdominal pain, constantlow back pain, paresthesias allover, shortness of breath,insomnia unable to sleep for the last 4 days, and weight loss of at least 30 pounds.  Patient was admitted to hospital with alcohol withdrawal, atypical chest pain for supportive care.  Alcohol abuse with alcohol withdrawal: Patient was admitted to hospital and treated symptomatically with multivitamins, benzodiazepines and ultimately with oral Librium. Day 6 with no alcohol today, patient is medically stabilized with no evidence of delirium tremens. Initial plan was for inpatient alcohol rehab, however this was not possible. Patient is determined to stop drinking, extensive  counseling done, resources provided for outpatient follow-up. Since patient is determind to stop drinking, I prescribed her 2 more days of tapering Librium doses with safe use instructions.  She will take multivitamins. Patient will continue to take her Zoloft that she takes at night that is helpful.  Acute UTI present on admission: Pansensitive E. coli.  Received 2 days of Rocephin.  3 more days of Keflex by mouth.  Electrolyte abnormalities including hypomagnesemia/hypophosphatemia/hypokalemia: Replaced aggressively.  She received IV replacement before discharge. Will discharge with oral magnesium.  Potassium is adequate and she is also on lisinopril. Please recheck in 1 week potassium, magnesium and phosphorus to ensure stabilization.  GERD: On omeprazole and sucralfate that she will continue.  Chest pain: Atypical.  Acute coronary syndrome ruled out.  Currently asymptomatic.  She will resume all her antihypertensives including beta-blockers.  Patient is asymptomatic today.  She is stable and able to go home today.  She is very motivated to follow-up with outpatient resources for alcohol cessation.    Discharge Diagnoses:  Principal Problem:   Alcohol withdrawal (HCC) Active Problems:   Epigastric pain   GERD (gastroesophageal reflux disease)   Elevated LFTs   Anemia   Hypomagnesemia   Acute lower UTI   Hypertensive urgency    Discharge Instructions  Discharge Instructions    Call MD for:  difficulty breathing, headache or visual disturbances   Complete by: As directed    Call MD for:  extreme fatigue   Complete by: As directed    Call MD for:  persistant dizziness or light-headedness   Complete by: As directed    Diet - low sodium heart healthy   Complete by: As directed    Discharge instructions  Complete by: As directed    Please go to out patient resources as discussed . You have been prescribed medicine for next 2 days, do not drink alcohol at all if you taking  those medications.   Increase activity slowly   Complete by: As directed      Allergies as of 04/19/2020   No Active Allergies     Medication List    TAKE these medications   amLODipine 10 MG tablet Commonly known as: NORVASC Take 1 tablet by mouth once daily   cephALEXin 500 MG capsule Commonly known as: KEFLEX Take 1 capsule (500 mg total) by mouth 3 (three) times daily for 3 days.   chlordiazePOXIDE 10 MG capsule Commonly known as: LIBRIUM 1 tab two times daily for 1 day 1 tab once daily for 1 day   dicyclomine 20 MG tablet Commonly known as: BENTYL Take 1 tablet (20 mg total) by mouth 2 (two) times daily.   lisinopril 10 MG tablet Commonly known as: ZESTRIL Take 2 tablets by mouth once daily   magnesium oxide 400 (241.3 Mg) MG tablet Commonly known as: MAG-OX Take 1 tablet (400 mg total) by mouth 2 (two) times daily for 14 days.   metoprolol succinate 100 MG 24 hr tablet Commonly known as: TOPROL-XL Take 1 tablet (100 mg total) by mouth daily. Take with or immediately following a meal.   mirtazapine 15 MG tablet Commonly known as: REMERON TAKE 1 TABLET BY MOUTH AT BEDTIME What changed:   when to take this  reasons to take this   multivitamin with minerals Tabs tablet Take 1 tablet by mouth daily.   omeprazole 20 MG capsule Commonly known as: PRILOSEC Take 1 capsule by mouth once daily   ondansetron 4 MG tablet Commonly known as: ZOFRAN Take 1 tablet (4 mg total) by mouth every 6 (six) hours as needed for nausea.   sertraline 50 MG tablet Commonly known as: ZOLOFT Take 100 mg by mouth daily.   sucralfate 1 GM/10ML suspension Commonly known as: CARAFATE Take 10 mLs (1 g total) by mouth 4 (four) times daily. What changed:   when to take this  reasons to take this       Follow-up Information    Services, [facility]. Schedule an appointment as soon as possible for a visit.   Contact information: [address] [place]  [address] [phone]              No Active Allergies  Consultations:  None   Procedures/Studies: DG Chest Portable 1 View  Result Date: 04/16/2020 CLINICAL DATA:  Chest pain EXAM: PORTABLE CHEST 1 VIEW COMPARISON:  February 25, 2020 chest radiograph and chest CT FINDINGS: Lungs are clear. Heart size and pulmonary vascularity are within normal limits. No adenopathy. No bone lesions. IMPRESSION: Lungs clear.  Cardiac silhouette within normal limits. Electronically Signed   By: Bretta Bang III M.D.   On: 04/16/2020 08:07   CT Angio Chest Aorta w/CM &/OR wo/CM  Result Date: 04/16/2020 CLINICAL DATA:  Chest pain EXAM: CT ANGIOGRAPHY CHEST WITH CONTRAST TECHNIQUE: Initially, axial CT images were obtained through the chest without intravenous contrast administration. Multidetector CT imaging of the chest was performed using the standard protocol during bolus administration of intravenous contrast. Multiplanar CT image reconstructions and MIPs were obtained to evaluate the vascular anatomy. CONTRAST:  OMNIPAQUE IOHEXOL 350 MG/ML SOLN COMPARISON:  Chest radiograph April 16 2020; chest CT February 25, 2020 FINDINGS: Cardiovascular: There is no  intramural hematoma evident on noncontrast enhanced study. There is no evident mediastinal hematoma. No thoracic aortic aneurysm or dissection evident. There are occasional foci of calcification in the aorta. There are several foci of calcification in coronary arteries. Major venous structures appear patent. No aneurysm or dissection involving visualized great vessels. No appreciable atherosclerotic plaque or ulceration noted in the visualized great vessels. No aortic ulceration noted. No pericardial effusion or pericardial thickening. No evident pulmonary embolus. Mediastinum/Nodes: Visualized thyroid appears normal. No evident thoracic adenopathy. No esophageal lesions are appreciable. Lungs/Pleura: Lungs are clear. No evident pleural effusion. No  evident pneumothorax. Trachea and major bronchial structures appear patent. Upper Abdomen: There is hepatic steatosis. Visualized upper abdominal structures otherwise appear unremarkable. Musculoskeletal: No blastic or lytic bone lesions. Subchondral cystic change noted in the left shoulder region, stable. No chest wall lesions evident. Review of the MIP images confirms the above findings. IMPRESSION: 1. No thoracic aortic aneurysm or dissection. No intramural hematoma or plaque ulceration. Mild thoracic aortic atherosclerosis evident. Visualized great vessels appear unremarkable. 2.  No evident pulmonary embolus. 3.  Lungs clear. 4.  No evident adenopathy. 5.  Hepatic steatosis again noted. Aortic Atherosclerosis (ICD10-I70.0). Electronically Signed   By: Bretta Bang III M.D.   On: 04/16/2020 12:16   (Echo, Carotid, EGD, Colonoscopy, ERCP)    Subjective: Patient seen and examined.  No overnight events.  Denies any nausea vomiting chest pain.  Has occasional headache but far better than before.  She understands and has outpatient resources and ready to go home.   Discharge Exam: Vitals:   04/18/20 2103 04/19/20 0427  BP: (!) 156/96 (!) 152/94  Pulse: 85 80  Resp: 20 20  Temp: 98.2 F (36.8 C) 98 F (36.7 C)  SpO2: 97% 98%   Vitals:   04/18/20 0211 04/18/20 1255 04/18/20 2103 04/19/20 0427  BP: 137/85 140/88 (!) 156/96 (!) 152/94  Pulse: 71 80 85 80  Resp: 18 18 20 20   Temp: 98 F (36.7 C) 98.2 F (36.8 C) 98.2 F (36.8 C) 98 F (36.7 C)  TempSrc: Oral Oral Oral Oral  SpO2: 99% 98% 97% 98%  Weight:      Height:        General: Pt is alert, awake, not in acute distress She looks comfortable.  Walking in the room.  Alert oriented x4.  No asterixis, no tremors.  No neurological deficits. Cardiovascular: RRR, S1/S2 +, no rubs, no gallops Respiratory: CTA bilaterally, no wheezing, no rhonchi Abdominal: Soft, NT, ND, bowel sounds + Extremities: no edema, no cyanosis    The  results of significant diagnostics from this hospitalization (including imaging, microbiology, ancillary and laboratory) are listed below for reference.     Microbiology: Recent Results (from the past 240 hour(s))  Culture, Urine     Status: Abnormal   Collection Time: 04/16/20  1:15 PM   Specimen: Urine, Random  Result Value Ref Range Status   Specimen Description URINE, RANDOM  Final   Special Requests   Final    NONE Performed at [facility], [address], [place], [place]    Culture >=100,000 COLONIES/mL ESCHERICHIA COLI (A)  Final   Report Status 04/19/2020 FINAL  Final   Organism ID, Bacteria ESCHERICHIA COLI (A)  Final      Susceptibility   Escherichia coli - MIC*    AMPICILLIN <=2 SENSITIVE Sensitive     CEFAZOLIN <=4 SENSITIVE Sensitive     CEFEPIME <=0.12 SENSITIVE Sensitive  CEFTRIAXONE <=0.25 SENSITIVE Sensitive     CIPROFLOXACIN <=0.25 SENSITIVE Sensitive     GENTAMICIN <=1 SENSITIVE Sensitive     IMIPENEM <=0.25 SENSITIVE Sensitive     NITROFURANTOIN <=16 SENSITIVE Sensitive     TRIMETH/SULFA <=20 SENSITIVE Sensitive     AMPICILLIN/SULBACTAM <=2 SENSITIVE Sensitive     PIP/TAZO <=4 SENSITIVE Sensitive     * >=100,000 COLONIES/mL ESCHERICHIA COLI     Labs: BNP (last 3 results) No results for input(s): BNP in the last 8760 hours. Basic Metabolic Panel: Recent Labs  Lab 04/16/20 0735 04/16/20 1035 04/17/20 0226 04/19/20 0054  NA 136  --  133* 134*  K 4.0  --  3.6 2.9*  CL 98  --  97* 102  CO2 23  --  26 25  GLUCOSE 106*  --  86 121*  BUN 11  --  7 <5*  CREATININE 0.93  --  0.88 0.78  CALCIUM 9.1  --  8.8* 8.9  MG  --  1.6* 2.2 1.5*  PHOS  --   --  1.4* 2.8   Liver Function Tests: Recent Labs  Lab 04/16/20 0735 04/17/20 0226 04/19/20 0054  AST 127* 113* 199*  ALT 44 37 68*  ALKPHOS 48 45 44  BILITOT 1.1 1.3* 0.8  PROT 7.8 7.5 6.2*  ALBUMIN 4.4 4.3 3.7   Recent Labs  Lab 04/16/20 1035  LIPASE 93*   No results for  input(s): AMMONIA in the last 168 hours. CBC: Recent Labs  Lab 04/16/20 0735 04/17/20 0226 04/19/20 0054  WBC 5.1 4.6 2.8*  NEUTROABS  --   --  1.2*  HGB 10.4* 9.5* 8.9*  HCT 32.1* 30.0* 28.3*  MCV 83.2 85.5 84.7  PLT 228 173 128*   Cardiac Enzymes: No results for input(s): CKTOTAL, CKMB, CKMBINDEX, TROPONINI in the last 168 hours. BNP: Invalid input(s): POCBNP CBG: No results for input(s): GLUCAP in the last 168 hours. D-Dimer No results for input(s): DDIMER in the last 72 hours. Hgb A1c No results for input(s): HGBA1C in the last 72 hours. Lipid Profile No results for input(s): CHOL, HDL, LDLCALC, TRIG, CHOLHDL, LDLDIRECT in the last 72 hours. Thyroid function studies No results for input(s): TSH, T4TOTAL, T3FREE, THYROIDAB in the last 72 hours.  Invalid input(s): FREET3 Anemia work up No results for input(s): VITAMINB12, FOLATE, FERRITIN, TIBC, IRON, RETICCTPCT in the last 72 hours. Urinalysis    Component Value Date/Time   COLORURINE YELLOW 04/16/2020 1315   APPEARANCEUR HAZY (A) 04/16/2020 1315   LABSPEC 1.038 (H) 04/16/2020 1315   PHURINE 8.0 04/16/2020 1315   GLUCOSEU NEGATIVE 04/16/2020 1315   HGBUR SMALL (A) 04/16/2020 1315   BILIRUBINUR NEGATIVE 04/16/2020 1315   BILIRUBINUR trace 12/20/2012 1553   KETONESUR 20 (A) 04/16/2020 1315   PROTEINUR NEGATIVE 04/16/2020 1315   UROBILINOGEN 0.2 06/22/2014 1147   NITRITE POSITIVE (A) 04/16/2020 1315   LEUKOCYTESUR MODERATE (A) 04/16/2020 1315   Sepsis Labs Invalid input(s): PROCALCITONIN,  WBC,  LACTICIDVEN Microbiology Recent Results (from the past 240 hour(s))  Culture, Urine     Status: Abnormal   Collection Time: 04/16/20  1:15 PM   Specimen: Urine, Random  Result Value Ref Range Status   Specimen Description URINE, RANDOM  Final   Special Requests   Final    NONE Performed at [facility], [address], [place], [place] [address]    Culture >=100,000 COLONIES/mL ESCHERICHIA COLI (A)  Final    Report Status 04/19/2020 FINAL  Final  Organism ID, Bacteria ESCHERICHIA COLI (A)  Final      Susceptibility   Escherichia coli - MIC*    AMPICILLIN <=2 SENSITIVE Sensitive     CEFAZOLIN <=4 SENSITIVE Sensitive     CEFEPIME <=0.12 SENSITIVE Sensitive     CEFTRIAXONE <=0.25 SENSITIVE Sensitive     CIPROFLOXACIN <=0.25 SENSITIVE Sensitive     GENTAMICIN <=1 SENSITIVE Sensitive     IMIPENEM <=0.25 SENSITIVE Sensitive     NITROFURANTOIN <=16 SENSITIVE Sensitive     TRIMETH/SULFA <=20 SENSITIVE Sensitive     AMPICILLIN/SULBACTAM <=2 SENSITIVE Sensitive     PIP/TAZO <=4 SENSITIVE Sensitive     * >=100,000 COLONIES/mL ESCHERICHIA COLI     Time coordinating discharge: 35 minutes  SIGNED:   Dorcas CarrowKuber Tytiana Coles, MD  [facility] 04/19/2020, 11:01 AM

## 2020-05-15 ENCOUNTER — Other Ambulatory Visit: Payer: Self-pay | Admitting: Family Medicine

## 2020-05-15 NOTE — Telephone Encounter (Signed)
Pharmacy requests refill on: Sertraline 50 mg   LAST REFILL: Historical Med  LAST OV: 05/31/2019 NEXT OV: Not Scheduled  PHARMACY: [id number] [place], [place]

## 2020-05-16 ENCOUNTER — Other Ambulatory Visit: Payer: Self-pay

## 2020-05-16 MED ORDER — LISINOPRIL 10 MG PO TABS: 2.0000 | ORAL_TABLET | Freq: Every day | ORAL | 0 refills | Status: DC

## 2020-05-16 MED ORDER — METOPROLOL SUCCINATE ER 100 MG PO TB24: 100.0000 mg | ORAL_TABLET | Freq: Every day | ORAL | 0 refills | Status: DC

## 2020-05-16 NOTE — Telephone Encounter (Signed)
Pharmacy requests refill on: Metoprolol Succinate 100 mg 24 hr   LAST REFILL: 07/04/2019 (Q-90, R-3) Order has expired  LAST OV: 05/31/2019 NEXT OV: Not Scheduled  PHARMACY: [id number] [place], [place]  Pharmacy requests refill on: Lisinopril 10 mg   LAST REFILL: 08/24/2019 (Q-60, R-3) LAST OV: 05/31/2019 NEXT OV: Not Scheduled  PHARMACY: [id number] [place], [place]

## 2020-05-17 NOTE — Telephone Encounter (Signed)
Pt left v/m requesting cb when metoprolol and lisinopril is refilled.

## 2020-05-19 ENCOUNTER — Emergency Department (HOSPITAL_COMMUNITY): Payer: 59

## 2020-05-19 ENCOUNTER — Inpatient Hospital Stay (HOSPITAL_COMMUNITY)
Admission: EM | Admit: 2020-05-19 | Discharge: 2020-05-22 | DRG: 896 | Disposition: A | Discharge status: Home / Self Care | Payer: 59 | Attending: Internal Medicine | Admitting: Internal Medicine

## 2020-05-19 ENCOUNTER — Encounter (HOSPITAL_COMMUNITY): Payer: Self-pay | Admitting: Emergency Medicine

## 2020-05-19 ENCOUNTER — Other Ambulatory Visit: Payer: Self-pay

## 2020-05-19 ENCOUNTER — Inpatient Hospital Stay (HOSPITAL_COMMUNITY): Payer: 59

## 2020-05-19 DIAGNOSIS — Z8249 Family history of ischemic heart disease and other diseases of the circulatory system: Secondary | ICD-10-CM

## 2020-05-19 DIAGNOSIS — E872 Acidosis: Secondary | ICD-10-CM | POA: Diagnosis present

## 2020-05-19 DIAGNOSIS — R112 Nausea with vomiting, unspecified: Secondary | ICD-10-CM | POA: Diagnosis not present

## 2020-05-19 DIAGNOSIS — R748 Abnormal levels of other serum enzymes: Secondary | ICD-10-CM

## 2020-05-19 DIAGNOSIS — W19XXXA Unspecified fall, initial encounter: Secondary | ICD-10-CM | POA: Diagnosis present

## 2020-05-19 DIAGNOSIS — R7989 Other specified abnormal findings of blood chemistry: Secondary | ICD-10-CM | POA: Diagnosis present

## 2020-05-19 DIAGNOSIS — Z20822 Contact with and (suspected) exposure to covid-19: Secondary | ICD-10-CM | POA: Diagnosis present

## 2020-05-19 DIAGNOSIS — I160 Hypertensive urgency: Secondary | ICD-10-CM | POA: Diagnosis present

## 2020-05-19 DIAGNOSIS — F32A Depression, unspecified: Secondary | ICD-10-CM | POA: Diagnosis present

## 2020-05-19 DIAGNOSIS — E860 Dehydration: Secondary | ICD-10-CM | POA: Diagnosis present

## 2020-05-19 DIAGNOSIS — I429 Cardiomyopathy, unspecified: Secondary | ICD-10-CM | POA: Diagnosis present

## 2020-05-19 DIAGNOSIS — E876 Hypokalemia: Secondary | ICD-10-CM | POA: Diagnosis present

## 2020-05-19 DIAGNOSIS — F419 Anxiety disorder, unspecified: Secondary | ICD-10-CM | POA: Diagnosis present

## 2020-05-19 DIAGNOSIS — R9431 Abnormal electrocardiogram [ECG] [EKG]: Secondary | ICD-10-CM | POA: Diagnosis present

## 2020-05-19 DIAGNOSIS — Z79899 Other long term (current) drug therapy: Secondary | ICD-10-CM

## 2020-05-19 DIAGNOSIS — I10 Essential (primary) hypertension: Secondary | ICD-10-CM | POA: Diagnosis present

## 2020-05-19 DIAGNOSIS — G43909 Migraine, unspecified, not intractable, without status migrainosus: Secondary | ICD-10-CM | POA: Diagnosis present

## 2020-05-19 DIAGNOSIS — D696 Thrombocytopenia, unspecified: Secondary | ICD-10-CM | POA: Diagnosis present

## 2020-05-19 DIAGNOSIS — F418 Other specified anxiety disorders: Secondary | ICD-10-CM | POA: Diagnosis present

## 2020-05-19 DIAGNOSIS — R7401 Elevation of levels of liver transaminase levels: Secondary | ICD-10-CM | POA: Diagnosis present

## 2020-05-19 DIAGNOSIS — D649 Anemia, unspecified: Secondary | ICD-10-CM | POA: Diagnosis present

## 2020-05-19 DIAGNOSIS — K859 Acute pancreatitis without necrosis or infection, unspecified: Secondary | ICD-10-CM | POA: Diagnosis present

## 2020-05-19 DIAGNOSIS — J301 Allergic rhinitis due to pollen: Secondary | ICD-10-CM | POA: Diagnosis present

## 2020-05-19 DIAGNOSIS — M7918 Myalgia, other site: Secondary | ICD-10-CM | POA: Diagnosis present

## 2020-05-19 DIAGNOSIS — R0789 Other chest pain: Secondary | ICD-10-CM | POA: Diagnosis present

## 2020-05-19 DIAGNOSIS — Z86718 Personal history of other venous thrombosis and embolism: Secondary | ICD-10-CM | POA: Diagnosis not present

## 2020-05-19 DIAGNOSIS — N179 Acute kidney failure, unspecified: Secondary | ICD-10-CM | POA: Diagnosis present

## 2020-05-19 DIAGNOSIS — K219 Gastro-esophageal reflux disease without esophagitis: Secondary | ICD-10-CM | POA: Diagnosis present

## 2020-05-19 DIAGNOSIS — K852 Alcohol induced acute pancreatitis without necrosis or infection: Secondary | ICD-10-CM | POA: Diagnosis not present

## 2020-05-19 DIAGNOSIS — R079 Chest pain, unspecified: Secondary | ICD-10-CM | POA: Diagnosis present

## 2020-05-19 DIAGNOSIS — G8929 Other chronic pain: Secondary | ICD-10-CM | POA: Diagnosis present

## 2020-05-19 DIAGNOSIS — R000 Tachycardia, unspecified: Secondary | ICD-10-CM | POA: Diagnosis present

## 2020-05-19 DIAGNOSIS — Z86711 Personal history of pulmonary embolism: Secondary | ICD-10-CM

## 2020-05-19 DIAGNOSIS — F10239 Alcohol dependence with withdrawal, unspecified: Principal | ICD-10-CM | POA: Diagnosis present

## 2020-05-19 DIAGNOSIS — F10939 Alcohol use, unspecified with withdrawal, unspecified: Secondary | ICD-10-CM

## 2020-05-19 DIAGNOSIS — Y92009 Unspecified place in unspecified non-institutional (private) residence as the place of occurrence of the external cause: Secondary | ICD-10-CM | POA: Diagnosis not present

## 2020-05-19 DIAGNOSIS — F10230 Alcohol dependence with withdrawal, uncomplicated: Secondary | ICD-10-CM | POA: Diagnosis not present

## 2020-05-19 LAB — I-STAT VENOUS BLOOD GAS, ED
Acid-Base Excess: 5 mmol/L — ABNORMAL HIGH (ref 0.0–2.0)
Bicarbonate: 29.5 mmol/L — ABNORMAL HIGH (ref 20.0–28.0)
Calcium, Ion: 0.91 mmol/L — ABNORMAL LOW (ref 1.15–1.40)
HCT: 29 % — ABNORMAL LOW (ref 36.0–46.0)
Hemoglobin: 9.9 g/dL — ABNORMAL LOW (ref 12.0–15.0)
O2 Saturation: 84 %
Potassium: 3.5 mmol/L (ref 3.5–5.1)
Sodium: 138 mmol/L (ref 135–145)
TCO2: 31 mmol/L (ref 22–32)
pCO2, Ven: 41.9 mmHg — ABNORMAL LOW (ref 44.0–60.0)
pH, Ven: 7.455 — ABNORMAL HIGH (ref 7.250–7.430)
pO2, Ven: 46 mmHg — ABNORMAL HIGH (ref 32.0–45.0)

## 2020-05-19 LAB — CBC
HCT: 34.8 % — ABNORMAL LOW (ref 36.0–46.0)
Hemoglobin: 10.8 g/dL — ABNORMAL LOW (ref 12.0–15.0)
MCH: 25.6 pg — ABNORMAL LOW (ref 26.0–34.0)
MCHC: 31 g/dL (ref 30.0–36.0)
MCV: 82.5 fL (ref 80.0–100.0)
Platelets: 212 10*3/uL (ref 150–400)
RBC: 4.22 MIL/uL (ref 3.87–5.11)
RDW: 20.4 % — ABNORMAL HIGH (ref 11.5–15.5)
WBC: 10.9 10*3/uL — ABNORMAL HIGH (ref 4.0–10.5)
nRBC: 0 % (ref 0.0–0.2)

## 2020-05-19 LAB — BASIC METABOLIC PANEL
Anion gap: 31 — ABNORMAL HIGH (ref 5–15)
Anion gap: 12 (ref 5–15)
Anion gap: 10 (ref 5–15)
BUN: 15 mg/dL (ref 6–20)
BUN: 15 mg/dL (ref 6–20)
BUN: 16 mg/dL (ref 6–20)
CO2: 22 mmol/L (ref 22–32)
CO2: 28 mmol/L (ref 22–32)
CO2: 26 mmol/L (ref 22–32)
Calcium: 9.6 mg/dL (ref 8.9–10.3)
Calcium: 7.9 mg/dL — ABNORMAL LOW (ref 8.9–10.3)
Calcium: 7.7 mg/dL — ABNORMAL LOW (ref 8.9–10.3)
Chloride: 83 mmol/L — ABNORMAL LOW (ref 98–111)
Chloride: 98 mmol/L (ref 98–111)
Chloride: 101 mmol/L (ref 98–111)
Creatinine, Ser: 3.59 mg/dL — ABNORMAL HIGH (ref 0.44–1.00)
Creatinine, Ser: 1.9 mg/dL — ABNORMAL HIGH (ref 0.44–1.00)
Creatinine, Ser: 1.68 mg/dL — ABNORMAL HIGH (ref 0.44–1.00)
GFR, Estimated: 15 mL/min — ABNORMAL LOW (ref >60)
GFR, Estimated: 32 mL/min — ABNORMAL LOW (ref >60)
GFR, Estimated: 37 mL/min — ABNORMAL LOW (ref >60)
Glucose, Bld: 187 mg/dL — ABNORMAL HIGH (ref 70–99)
Glucose, Bld: 95 mg/dL (ref 70–99)
Glucose, Bld: 84 mg/dL (ref 70–99)
Potassium: 3.6 mmol/L (ref 3.5–5.1)
Potassium: 3.5 mmol/L (ref 3.5–5.1)
Potassium: 3.7 mmol/L (ref 3.5–5.1)
Sodium: 136 mmol/L (ref 135–145)
Sodium: 138 mmol/L (ref 135–145)
Sodium: 137 mmol/L (ref 135–145)

## 2020-05-19 LAB — URINALYSIS, ROUTINE W REFLEX MICROSCOPIC
Bilirubin Urine: NEGATIVE
Glucose, UA: 50 mg/dL — AB
Ketones, ur: NEGATIVE mg/dL
Leukocytes,Ua: NEGATIVE
Nitrite: NEGATIVE
Protein, ur: 100 mg/dL — AB
Specific Gravity, Urine: 1.017 (ref 1.005–1.030)
pH: 5 (ref 5.0–8.0)

## 2020-05-19 LAB — RESP PANEL BY RT-PCR (FLU A&B, COVID) ARPGX2
Influenza A by PCR: NEGATIVE
Influenza B by PCR: NEGATIVE
SARS Coronavirus 2 by RT PCR: NEGATIVE

## 2020-05-19 LAB — ETHANOL: Alcohol, Ethyl (B): <10 mg/dL (ref <10)

## 2020-05-19 LAB — HEPATIC FUNCTION PANEL
ALT: 66 U/L — ABNORMAL HIGH (ref 0–44)
AST: 119 U/L — ABNORMAL HIGH (ref 15–41)
Albumin: 4.1 g/dL (ref 3.5–5.0)
Alkaline Phosphatase: 40 U/L (ref 38–126)
Bilirubin, Direct: 0.2 mg/dL (ref 0.0–0.2)
Indirect Bilirubin: 0.5 mg/dL (ref 0.3–0.9)
Total Bilirubin: 0.7 mg/dL (ref 0.3–1.2)
Total Protein: 6.9 g/dL (ref 6.5–8.1)

## 2020-05-19 LAB — LACTIC ACID, PLASMA
Lactic Acid, Venous: 3.2 mmol/L (ref 0.5–1.9)
Lactic Acid, Venous: 1.6 mmol/L (ref 0.5–1.9)

## 2020-05-19 LAB — I-STAT BETA HCG BLOOD, ED (MC, WL, AP ONLY): I-stat hCG, quantitative: <5 m[IU]/mL (ref <5)

## 2020-05-19 LAB — PHOSPHORUS: Phosphorus: 3.8 mg/dL (ref 2.5–4.6)

## 2020-05-19 LAB — SALICYLATE LEVEL: Salicylate Lvl: <7 mg/dL — ABNORMAL LOW (ref 7.0–30.0)

## 2020-05-19 LAB — CK: Total CK: 233 U/L (ref 38–234)

## 2020-05-19 LAB — TROPONIN I (HIGH SENSITIVITY)
Troponin I (High Sensitivity): 17 ng/L (ref <18)
Troponin I (High Sensitivity): 17 ng/L (ref <18)

## 2020-05-19 LAB — MAGNESIUM: Magnesium: 1.4 mg/dL — ABNORMAL LOW (ref 1.7–2.4)

## 2020-05-19 LAB — LIPASE, BLOOD: Lipase: 259 U/L — ABNORMAL HIGH (ref 11–51)

## 2020-05-19 MED ORDER — LORAZEPAM 2 MG/ML IJ SOLN
1.0000 mg | INTRAMUSCULAR | Status: DC | PRN
Start: 2020-05-19 — End: 2020-05-22
  Administered 2020-05-20 (×2): 2 mg via INTRAVENOUS
  Administered 2020-05-21: 1 mg via INTRAVENOUS
  Filled 2020-05-19 (×4): qty 1
  Filled 2020-05-19: qty 2

## 2020-05-19 MED ORDER — TRIMETHOBENZAMIDE HCL 100 MG/ML IM SOLN
200.0000 mg | Freq: Once | INTRAMUSCULAR | Status: AC
  Administered 2020-05-19: 200 mg via INTRAMUSCULAR
  Filled 2020-05-19: qty 2

## 2020-05-19 MED ORDER — SODIUM CHLORIDE 0.9 % IV BOLUS
1000.0000 mL | Freq: Once | INTRAVENOUS | Status: AC
  Administered 2020-05-19: 1000 mL via INTRAVENOUS

## 2020-05-19 MED ORDER — SODIUM CHLORIDE 0.9 % IV SOLN: INTRAVENOUS | Status: DC

## 2020-05-19 MED ORDER — ONDANSETRON HCL 4 MG/2ML IJ SOLN
4.0000 mg | Freq: Once | INTRAMUSCULAR | Status: AC
  Administered 2020-05-19: 4 mg via INTRAVENOUS
  Filled 2020-05-19: qty 2

## 2020-05-19 MED ORDER — HYDRALAZINE HCL 20 MG/ML IJ SOLN: 10.0000 mg | INTRAMUSCULAR | Status: DC | PRN

## 2020-05-19 MED ORDER — FOLIC ACID 1 MG PO TABS
1.0000 mg | ORAL_TABLET | Freq: Every day | ORAL | Status: DC
  Administered 2020-05-19 – 2020-05-22 (×4): 1 mg via ORAL
  Filled 2020-05-19 (×4): qty 1

## 2020-05-19 MED ORDER — ACETAMINOPHEN 650 MG RE SUPP: 650.0000 mg | Freq: Four times a day (QID) | RECTAL | Status: DC | PRN

## 2020-05-19 MED ORDER — SUCRALFATE 1 GM/10ML PO SUSP
1.0000 g | Freq: Four times a day (QID) | ORAL | Status: DC
  Administered 2020-05-19 – 2020-05-22 (×10): 1 g via ORAL
  Filled 2020-05-19 (×10): qty 10

## 2020-05-19 MED ORDER — LORAZEPAM 1 MG PO TABS
0.0000 mg | ORAL_TABLET | Freq: Two times a day (BID) | ORAL | Status: DC
  Administered 2020-05-22: 1 mg via ORAL
  Filled 2020-05-19: qty 1

## 2020-05-19 MED ORDER — THIAMINE HCL 100 MG PO TABS
100.0000 mg | ORAL_TABLET | Freq: Every day | ORAL | Status: DC
  Administered 2020-05-20 – 2020-05-22 (×3): 100 mg via ORAL
  Filled 2020-05-19 (×3): qty 1

## 2020-05-19 MED ORDER — THIAMINE HCL 100 MG/ML IJ SOLN
100.0000 mg | Freq: Every day | INTRAMUSCULAR | Status: DC
  Administered 2020-05-19: 100 mg via INTRAVENOUS
  Filled 2020-05-19: qty 2

## 2020-05-19 MED ORDER — MORPHINE SULFATE (PF) 2 MG/ML IV SOLN: 2.0000 mg | INTRAVENOUS | Status: DC | PRN

## 2020-05-19 MED ORDER — LORAZEPAM 2 MG/ML IJ SOLN
2.0000 mg | Freq: Once | INTRAMUSCULAR | Status: AC
  Administered 2020-05-19: 2 mg via INTRAVENOUS
  Filled 2020-05-19: qty 1

## 2020-05-19 MED ORDER — LORAZEPAM 2 MG/ML IJ SOLN
0.0000 mg | Freq: Two times a day (BID) | INTRAMUSCULAR | Status: DC
  Administered 2020-05-21: 2 mg via INTRAVENOUS
  Filled 2020-05-19: qty 1

## 2020-05-19 MED ORDER — LORAZEPAM 2 MG/ML IJ SOLN
1.0000 mg | Freq: Once | INTRAMUSCULAR | Status: AC
  Administered 2020-05-19: 1 mg via INTRAVENOUS
  Filled 2020-05-19: qty 1

## 2020-05-19 MED ORDER — METOPROLOL SUCCINATE ER 100 MG PO TB24
100.0000 mg | ORAL_TABLET | Freq: Every day | ORAL | Status: DC
  Administered 2020-05-19 – 2020-05-22 (×4): 100 mg via ORAL
  Filled 2020-05-19 (×4): qty 1

## 2020-05-19 MED ORDER — AMLODIPINE BESYLATE 10 MG PO TABS
10.0000 mg | ORAL_TABLET | Freq: Every day | ORAL | Status: DC
  Administered 2020-05-19 – 2020-05-22 (×4): 10 mg via ORAL
  Filled 2020-05-19 (×4): qty 1

## 2020-05-19 MED ORDER — ACETAMINOPHEN 325 MG PO TABS
650.0000 mg | ORAL_TABLET | Freq: Four times a day (QID) | ORAL | Status: DC | PRN
  Administered 2020-05-19 – 2020-05-21 (×4): 650 mg via ORAL
  Filled 2020-05-19 (×4): qty 2

## 2020-05-19 MED ORDER — LORAZEPAM 1 MG PO TABS
0.0000 mg | ORAL_TABLET | Freq: Four times a day (QID) | ORAL | Status: AC
  Administered 2020-05-20: 2 mg via ORAL
  Filled 2020-05-19: qty 2
  Filled 2020-05-19: qty 1

## 2020-05-19 MED ORDER — LORAZEPAM 2 MG/ML IJ SOLN
0.0000 mg | Freq: Four times a day (QID) | INTRAMUSCULAR | Status: AC
  Administered 2020-05-19: 2 mg via INTRAVENOUS
  Administered 2020-05-19: 4 mg via INTRAVENOUS
  Administered 2020-05-20: 2 mg via INTRAVENOUS
  Administered 2020-05-21: 1 mg via INTRAVENOUS
  Filled 2020-05-19 (×2): qty 1

## 2020-05-19 MED ORDER — ALBUTEROL SULFATE (2.5 MG/3ML) 0.083% IN NEBU: 2.5000 mg | INHALATION_SOLUTION | Freq: Four times a day (QID) | RESPIRATORY_TRACT | Status: DC | PRN

## 2020-05-19 MED ORDER — HEPARIN SODIUM (PORCINE) 5000 UNIT/ML IJ SOLN
5000.0000 [IU] | Freq: Three times a day (TID) | INTRAMUSCULAR | Status: DC
  Administered 2020-05-19 – 2020-05-20 (×5): 5000 [IU] via SUBCUTANEOUS
  Filled 2020-05-19 (×5): qty 1

## 2020-05-19 MED ORDER — SODIUM CHLORIDE 0.9% FLUSH
3.0000 mL | Freq: Two times a day (BID) | INTRAVENOUS | Status: DC
  Administered 2020-05-19 – 2020-05-20 (×4): 3 mL via INTRAVENOUS

## 2020-05-19 MED ORDER — HALOPERIDOL LACTATE 5 MG/ML IJ SOLN
2.0000 mg | Freq: Once | INTRAMUSCULAR | Status: AC
  Administered 2020-05-19: 2 mg via INTRAVENOUS
  Filled 2020-05-19: qty 1

## 2020-05-19 MED ORDER — ADULT MULTIVITAMIN W/MINERALS CH
1.0000 | ORAL_TABLET | Freq: Every day | ORAL | Status: DC
  Administered 2020-05-19 – 2020-05-22 (×4): 1 via ORAL
  Filled 2020-05-19 (×4): qty 1

## 2020-05-19 MED ORDER — LORAZEPAM 1 MG PO TABS
1.0000 mg | ORAL_TABLET | ORAL | Status: DC | PRN
  Administered 2020-05-20: 2 mg via ORAL
  Administered 2020-05-21 (×3): 1 mg via ORAL
  Filled 2020-05-19: qty 2
  Filled 2020-05-19 (×2): qty 1

## 2020-05-19 NOTE — ED Notes (Signed)
Pt transported to ultrasound.

## 2020-05-19 NOTE — ED Triage Notes (Signed)
Pt reports constant central chest pain with radiation to back and intermittent cramping all over her body since last night at 5pm. Reports trying to detox from alcohol and her last drink was Thursday. Normally drinks between a pint and a fifth of whiskey every day.

## 2020-05-19 NOTE — ED Provider Notes (Signed)
[facility] EMERGENCY DEPARTMENT Provider Note   CSN: [id number] Arrival date & time: 05/19/20  0945     History Chief Complaint  Patient presents with  . Chest Pain  . Alcohol Problem    Nicole Hughes is a 50 y.o. female.  Patient here with alcohol withdrawal symptoms.  Having body aches, chest pain, nausea, vomiting.  Last drink was about less than 48 hours ago.  Having tremors, nausea, vomiting, palpitations, chest pain.  She relapsed on alcohol several months ago and has been drinking heavily daily ever since.  Is trying to get get clean on her own.  She has been trying to work on get into rehab with the member in the community.  She denies any hallucinations.  She has not been able to keep anything down.  Having body aches everywhere.  The history is provided by the patient.  Illness Severity:  Mild Onset quality:  Gradual Timing:  Constant Progression:  Worsening Chronicity:  New Associated symptoms: chest pain, myalgias, nausea and vomiting   Associated symptoms: no abdominal pain, no cough, no diarrhea, no ear pain, no fever, no rash, no shortness of breath and no sore throat        Past Medical History:  Diagnosis Date  . Anxiety   . Cardiomyopathy (HCC)   . Chronic lower back pain   . Complication of anesthesia    "hard to get under"  . Cyst of ovary   . Depression    hx  . DVT complicating pregnancy 08/1998   RLE; RUE  . Dysmenorrhea   . Dyspareunia   . 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 GERD (gastroesophageal reflux disease)   . Hay fever    "fall and spring" (02/17/2017)  . Headache    "2-3 times/.wik" (02/17/2017)  . History of chicken pox   . Hypertension   . Migraine    "monthly" (02/17/2017)  . Pelvic pain   . Pulmonary embolism (HCC) 08/1998   S/P childbirth    Patient Active Problem List   Diagnosis Date Noted  . Acute lower UTI 04/16/2020  . Hypertensive urgency 04/16/2020  . AKI (acute kidney injury)  (HCC) 12/24/2019  . Hypomagnesemia 12/24/2019  . Intractable nausea and vomiting 11/27/2019  . Alcohol withdrawal (HCC) 11/27/2019  . Dysphagia 05/31/2019  . Insect bite 06/21/2018  . Pedal edema 03/31/2018  . Anemia 03/31/2018  . Appetite loss 03/08/2018  . Myofascial pain 03/08/2018  . IBS (irritable bowel syndrome) 03/08/2018  . Alcoholic hepatitis without ascites 03/05/2018  . Elevated LFTs 03/03/2018  . Achilles tendonitis 01/28/2018  . Abnormal cervical Papanicolaou smear 09/29/2017  . Insomnia 09/29/2017  . Pain 09/29/2017  . Palpitations 05/25/2017  . Chest wall pain 05/25/2017  . Elevated transaminase level 05/25/2017  . Depression with anxiety 02/23/2017  . 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 Hyponatremia 02/17/2017  . Hypokalemia 09/24/2016  . Pancreatitis 09/24/2016  . Right flank pain 09/22/2016  . Alcohol abuse 09/22/2016  . H/O acute pancreatitis 09/22/2016  . GERD (gastroesophageal reflux disease) 04/09/2015  . Epigastric pain 01/17/2014  . 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 Chest pain 08/20/2012  . Stress reaction 07/23/2012  . Hypertension 06/09/2012  . History of pulmonary embolism 06/09/2012  . Preop cardiovascular exam 06/08/2012  . Dysmenorrhea   . Dyspareunia   . Pelvic pain     Past Surgical History:  Procedure Laterality Date  . CESAREAN SECTION  1994  . ECTOPIC PREGNANCY SURGERY  2010  .  LAPAROSCOPY N/A 09/24/2012   Procedure: OPERATIVE LAPROSCOPY WITH LYSIS OF ADHESIONS;  Surgeon: Sherron MondayJody [name], MD;  Location: [facility];  Service: Gynecology;  Laterality: N/A;  . TONSILLECTOMY  1984  . TUBAL LIGATION  2010     OB History   No obstetric history on file.     Family History  Problem Relation Age of Onset  . Cancer - Lung Mother   . Heart failure Father   . Hypertension Father     Social History   Tobacco Use  . Smoking status: Never Smoker  . Smokeless tobacco: Never Used  Vaping Use  . Vaping Use: Never used  Substance Use  Topics  . Alcohol use: Yes    Alcohol/week: 5.0 standard drinks    Types: 5 Cans of beer per week    Comment: sometimes 5 a day  . Drug use: Yes    Frequency: 1.0 times per week    Types: Marijuana    Comment: Gives you chest pain - trying to quit    Home Medications Prior to Admission medications   Medication Sig Start Date End Date Taking? Authorizing Provider  amLODipine (NORVASC) 10 MG tablet Take 1 tablet by mouth once daily Patient taking differently: Take 10 mg by mouth daily. 06/07/19  Yes Tower, Audrie GallusMarne A, MD  dicyclomine (BENTYL) 20 MG tablet Take 1 tablet (20 mg total) by mouth 2 (two) times daily. 02/25/20  Yes Sabas SousBero, Michael M, MD  lisinopril (ZESTRIL) 10 MG tablet Take 2 tablets (20 mg total) by mouth daily. Patient taking differently: Take 20 mg by mouth daily. 05/16/20  Yes Tower, Audrie GallusMarne A, MD  metoprolol succinate (TOPROL-XL) 100 MG 24 hr tablet Take 1 tablet (100 mg total) by mouth daily. Take with or immediately following a meal. 05/16/20 08/14/20 Yes Tower, Audrie GallusMarne A, MD  Multiple Vitamin (MULTIVITAMIN WITH MINERALS) TABS tablet Take 1 tablet by mouth daily. 12/26/19  Yes Alwyn RenMathews, Elizabeth G, MD  omeprazole (PRILOSEC) 20 MG capsule Take 1 capsule by mouth once daily 03/01/20  Yes Tower, Audrie GallusMarne A, MD  ondansetron (ZOFRAN) 4 MG tablet Take 1 tablet (4 mg total) by mouth every 6 (six) hours as needed for nausea. 12/26/19  Yes Alwyn RenMathews, Elizabeth G, MD  sertraline (ZOLOFT) 50 MG tablet TAKE 1 TABLET BY MOUTH ONCE DAILY FOR 2 WEEKS THEN INCREASE TO 1&1/2 TABLETS FOR 2 WEEKS THEN 2 TABS DAILY Patient taking differently: Take 100 mg by mouth daily. 05/15/20  Yes Tower, Audrie GallusMarne A, MD  sucralfate (CARAFATE) 1 GM/10ML suspension Take 10 mLs (1 g total) by mouth 4 (four) times daily. Patient taking differently: Take 1 g by mouth 4 (four) times daily as needed (For constipation). 06/21/19  Yes Armbruster, Willaim RayasSteven P, MD  chlordiazePOXIDE (LIBRIUM) 10 MG capsule 1 tab two times daily for 1 day 1 tab  once daily for 1 day Patient not taking: Reported on 05/19/2020 04/19/20   Dorcas CarrowGhimire, Kuber, MD  mirtazapine (REMERON) 15 MG tablet TAKE 1 TABLET BY MOUTH AT BEDTIME Patient not taking: Reported on 05/19/2020 05/02/19   Judy Pimpleower, Marne A, MD    Allergies    Patient has no known allergies.  Review of Systems   Review of Systems  Constitutional: Negative for chills and fever.  HENT: Negative for ear pain and sore throat.   Eyes: Negative for pain and visual disturbance.  Respiratory: Negative for cough and shortness of breath.   Cardiovascular: Positive for chest pain. Negative for palpitations.  Gastrointestinal: Positive for nausea and vomiting.  Negative for abdominal distention, abdominal pain, anal bleeding, constipation and diarrhea.  Genitourinary: Negative for dysuria and hematuria.  Musculoskeletal: Positive for myalgias. Negative for arthralgias, back pain and neck pain.  Skin: Negative for color change and rash.  Neurological: Positive for tremors. Negative for seizures and syncope.  All other systems reviewed and are negative.   Physical Exam Updated Vital Signs  ED Triage Vitals  Enc Vitals Group     BP 05/19/20 0955 (!) 176/162     Pulse Rate 05/19/20 0955 (!) 54     Resp 05/19/20 0955 (!) 26     Temp 05/19/20 0955 98.3 F (36.8 C)     Temp src --      SpO2 05/19/20 0955 95 %     Weight 05/19/20 1019 146 lb (66.2 kg)     Height 05/19/20 1019 5\' 5"  (1.651 m)     Head Circumference --      Peak Flow --      Pain Score 05/19/20 0950 7     Pain Loc --      Pain Edu? --      Excl. in GC? --     Physical Exam Vitals and nursing note reviewed.  Constitutional:      General: She is in acute distress.     Appearance: She is well-developed. She is ill-appearing.  HENT:     Head: Normocephalic and atraumatic.  Eyes:     Extraocular Movements: Extraocular movements intact.     Conjunctiva/sclera: Conjunctivae normal.     Pupils: Pupils are equal, round, and reactive to  light.  Cardiovascular:     Rate and Rhythm: Normal rate and regular rhythm.     Pulses:          Radial pulses are 2+ on the right side and 2+ on the left side.       Dorsalis pedis pulses are 2+ on the right side and 2+ on the left side.     Heart sounds: No murmur heard.   Pulmonary:     Effort: Pulmonary effort is normal. No respiratory distress.     Breath sounds: Normal breath sounds. No decreased breath sounds or wheezing.  Abdominal:     Palpations: Abdomen is soft.     Tenderness: There is no abdominal tenderness.  Musculoskeletal:        General: Normal range of motion.     Cervical back: Normal range of motion and neck supple.     Right lower leg: No edema.     Left lower leg: No edema.  Skin:    General: Skin is warm and dry.     Capillary Refill: Capillary refill takes less than 2 seconds.  Neurological:     General: No focal deficit present.     Mental Status: She is alert.     Motor: Tremor present.     ED Results / Procedures / Treatments   Labs (all labs ordered are listed, but only abnormal results are displayed) Labs Reviewed  BASIC METABOLIC PANEL - Abnormal; Notable for the following components:      Result Value   Chloride 83 (*)    Glucose, Bld 187 (*)    Creatinine, Ser 3.59 (*)    GFR, Estimated 15 (*)    Anion gap 31 (*)    All other components within normal limits  CBC - Abnormal; Notable for the following components:   WBC 10.9 (*)    Hemoglobin 10.8 (*)  HCT 34.8 (*)    MCH 25.6 (*)    RDW 20.4 (*)    All other components within normal limits  HEPATIC FUNCTION PANEL - Abnormal; Notable for the following components:   AST 119 (*)    ALT 66 (*)    All other components within normal limits  LIPASE, BLOOD - Abnormal; Notable for the following components:   Lipase 259 (*)    All other components within normal limits  LACTIC ACID, PLASMA - Abnormal; Notable for the following components:   Lactic Acid, Venous 3.2 (*)    All other  components within normal limits  SALICYLATE LEVEL - Abnormal; Notable for the following components:   Salicylate Lvl <7.0 (*)    All other components within normal limits  I-STAT VENOUS BLOOD GAS, ED - Abnormal; Notable for the following components:   pH, Ven 7.455 (*)    pCO2, Ven 41.9 (*)    pO2, Ven 46.0 (*)    Bicarbonate 29.5 (*)    Acid-Base Excess 5.0 (*)    Calcium, Ion 0.91 (*)    HCT 29.0 (*)    Hemoglobin 9.9 (*)    All other components within normal limits  RESP PANEL BY RT-PCR (FLU A&B, COVID) ARPGX2  ETHANOL  CK  URINALYSIS, ROUTINE W REFLEX MICROSCOPIC  LACTIC ACID, PLASMA  RAPID URINE DRUG SCREEN, HOSP PERFORMED  I-STAT BETA HCG BLOOD, ED ([facility], [facility], [facility] ONLY)  TROPONIN I (HIGH SENSITIVITY)  TROPONIN I (HIGH SENSITIVITY)    EKG EKG Interpretation  Date/Time:  Saturday May 19 2020 09:52:40 EDT Ventricular Rate:  152 PR Interval:  128 QRS Duration: 60 QT Interval:  320 QTC Calculation: 508 R Axis:   59 Text Interpretation: Sinus tachycardia Nonspecific ST abnormality Abnormal ECG Confirmed by Virgina Norfolk ([id number]) on 05/19/2020 10:12:58 AM   Radiology DG Chest 2 View  Result Date: 05/19/2020 CLINICAL DATA:  Chest pain.  Detox from alcohol. EXAM: CHEST - 2 VIEW COMPARISON:  04/16/2020 FINDINGS: Stable elevation of the RIGHT hemidiaphragm. Heart size is normal. The lungs are clear. IMPRESSION: No active cardiopulmonary disease. Electronically Signed   By: Norva Pavlov M.D.   On: 05/19/2020 10:59    Procedures .Critical Care Performed by: Virgina Norfolk, DO Authorized by: Virgina Norfolk, DO   Critical care provider statement:    Critical care time (minutes):  45   Critical care was necessary to treat or prevent imminent or life-threatening deterioration of the following conditions:  Renal failure   Critical care was time spent personally by me on the following activities:  Discussions with consultants, evaluation of patient's response to treatment,  examination of patient, ordering and performing treatments and interventions, ordering and review of laboratory studies, ordering and review of radiographic studies, pulse oximetry, re-evaluation of patient's condition, obtaining history from patient or surrogate and review of old charts     Medications Ordered in ED Medications  LORazepam (ATIVAN) injection 0-4 mg (0 mg Intravenous Not Given 05/19/20 1117)    Or  LORazepam (ATIVAN) tablet 0-4 mg ( Oral See Alternative 05/19/20 1117)  LORazepam (ATIVAN) injection 0-4 mg (has no administration in time range)    Or  LORazepam (ATIVAN) tablet 0-4 mg (has no administration in time range)  thiamine tablet 100 mg ( Oral See Alternative 05/19/20 1115)    Or  thiamine (B-1) injection 100 mg (100 mg Intravenous Given 05/19/20 1115)  sodium chloride 0.9 % bolus 1,000 mL (0 mLs Intravenous Stopped 05/19/20 1033)  LORazepam (ATIVAN) injection  1 mg (1 mg Intravenous Given 05/19/20 1017)  LORazepam (ATIVAN) injection 2 mg (2 mg Intravenous Given 05/19/20 1101)  ondansetron (ZOFRAN) injection 4 mg (4 mg Intravenous Given 05/19/20 1113)  sodium chloride 0.9 % bolus 1,000 mL (0 mLs Intravenous Stopped 05/19/20 1216)  LORazepam (ATIVAN) injection 1 mg (1 mg Intravenous Given 05/19/20 1201)  haloperidol lactate (HALDOL) injection 2 mg (2 mg Intravenous Given 05/19/20 1202)    ED Course  I have reviewed the triage vital signs and the nursing notes.  Pertinent labs & imaging results that were available during my care of the patient were reviewed by me and considered in my medical decision making (see chart for details).    MDM Rules/Calculators/A&P                          [name] is here with chest pain, body aches, nausea, vomiting, alcohol withdrawal symptoms.  Patient tachycardic to the 150s and hypertensive.  Has not had an alcoholic beverage in about 48 hours.  Patient has drank heavily for the last several months.  History of alcohol abuse.  Trying  to get clean on her own and try to get into a rehab center.  Denies any hallucinations.  Has had chest pain but no shortness of breath, no abdominal pain.  She has had uncontrollable nausea and vomiting and unable to keep anything down.  She is not hypoxic.  CIWA score 19.  Will give Ativan.  Will give fluid bolus and check labs.  Concern for electrolyte abnormality, alcohol withdrawal, less likely ACS or PE.  Creatinine elevated to 3.59.  Anion gap 31.  However normal white count, blood sugar 187.  Suspect overall this is secondary to severe dehydration from uncontrollable nausea and vomiting.  However will add blood gas, CK, urine drug screen, salicylate level, lactic acid.  Has no fever, no white count.  Have low suspicion for sepsis.  Overall suspect abnormalities are in the setting of alcohol withdrawal causing nausea and vomiting and severe dehydration.  Will give additional Ativan, Haldol, fluid bolus while we await this work-up.  Possible that she has rhabdomyolysis.  Troponin normal.  EKG shows sinus tachycardia.  Chest x-ray without signs of infection.  Anticipate admission for further care once remaining lab work is back.  Lactic acid mildly elevated.  Salicylate level normal.  CK normal.  Mild elevation in liver enzymes and lipase.  However no fever.  Suspect alcohol withdrawal symptoms in the setting of likely pancreatitis.  Feeling better after some IV fluids and IV Ativan. CIWA score has improved to 11.  She will need further care with fluid resuscitation and n.p.o. status.  Will admit to medicine.  This chart was dictated using voice recognition software.  Despite best efforts to proofread,  errors can occur which can change the documentation meaning.      Final Clinical Impression(s) / ED Diagnoses Final diagnoses:  AKI (acute kidney injury) (HCC)  Nausea and vomiting, intractability of vomiting not specified, unspecified vomiting type  Alcohol withdrawal syndrome with complication  (HCC)  Elevated lipase    Rx / DC Orders ED Discharge Orders    None       Virgina Norfolk, DO 05/19/20 1301

## 2020-05-19 NOTE — ED Notes (Signed)
Attempt report 1 

## 2020-05-19 NOTE — Plan of Care (Signed)

## 2020-05-19 NOTE — H&P (Addendum)
History and Physical    Nicole Hughes ZOX:[id number] DOB: Feb 26, 1970 DOA: 05/19/2020  Referring MD/NP/PA: Virgina Norfolk, MD PCP: Judy Pimple, MD  Patient coming from: Home via EMS  Chief Complaint: Nausea, vomiting, and abdominal pain  I have personally briefly reviewed patient's old medical records in [facility]   HPI: Nicole Hughes is a 50 y.o. female with medical history significant of alcohol abuse, history of DVT/PE following childbirth, esophageal ulcer, anxiety, and depression presents with complaints of nausea, vomiting, and abdominal pain.  She reports that she had been drinking approximately 1/5 of whiskey per day after the passing of her father in March and recent surgery.  She reports that she last drank approximately 48 hours ago.  Since that time she reports that she has not been doing well.  Complains of a constant sharp epigastric abdominal pain that radiates to her back.  Emesis was reported to be nonbloody in appearance.  She has not had a bowel movement in 2 days, but reports that she is still able to pass flatus.  Denies having any significant fever or   ED Course: Upon admission into the emergency department patient was noted to be afebrile, pulse up to 150s, respiration 12-26, blood pressure was elevated at 187/107, and O2 saturation maintained on room air.  Labs significant for WBC 10.9, hemoglobin 10.8, CO2 22, BUN 15, creatinine 3.59, anion gap 31, lipase 259, AST 119, ALT 66, total bilirubin 0.7, CK2 33, lactic acid 3.2.  Salicylate and alcohol level was undetectable.  Her initial CIWA was noted to be 19.  Patient had been given 2 L of normal saline IV fluids, Ativan, started on CIWA protocols.  TRH called to admit.  Review of Systems  Constitutional: Positive for malaise/fatigue. Negative for fever.  Eyes: Negative for photophobia and pain.  Respiratory: Negative for sputum production, shortness of breath and stridor.   Cardiovascular: Positive for  chest pain. Negative for leg swelling.  Gastrointestinal: Positive for abdominal pain, nausea and vomiting.  Genitourinary: Negative for dysuria and hematuria.  Musculoskeletal: Positive for back pain.  Skin: Negative for rash.  Neurological: Negative for focal weakness and loss of consciousness.  Psychiatric/Behavioral: Positive for substance abuse. Negative for memory loss.    Past Medical History:  Diagnosis Date  . Anxiety   . Cardiomyopathy (HCC)   . Chronic lower back pain   . Complication of anesthesia    "hard to get under"  . Cyst of ovary   . Depression    hx  . DVT complicating pregnancy 08/1998   RLE; RUE  . Dysmenorrhea   . Dyspareunia   . 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 GERD (gastroesophageal reflux disease)   . Hay fever    "fall and spring" (02/17/2017)  . Headache    "2-3 times/.wik" (02/17/2017)  . History of chicken pox   . Hypertension   . Migraine    "monthly" (02/17/2017)  . Pelvic pain   . Pulmonary embolism (HCC) 08/1998   S/P childbirth    Past Surgical History:  Procedure Laterality Date  . CESAREAN SECTION  1994  . ECTOPIC PREGNANCY SURGERY  2010  . LAPAROSCOPY N/A 09/24/2012   Procedure: OPERATIVE LAPROSCOPY WITH LYSIS OF ADHESIONS;  Surgeon: Sherron Monday, MD;  Location: [facility];  Service: Gynecology;  Laterality: N/A;  . TONSILLECTOMY  1984  . TUBAL LIGATION  2010     reports that she has never smoked. She has never used smokeless tobacco. She  reports current alcohol use of about 5.0 standard drinks of alcohol per week. She reports current drug use. Frequency: 1.00 time per week. Drug: Marijuana.  No Known Allergies  Family History  Problem Relation Age of Onset  . Cancer - Lung Mother   . Heart failure Father   . Hypertension Father     Prior to Admission medications   Medication Sig Start Date End Date Taking? Authorizing Provider  amLODipine (NORVASC) 10 MG tablet Take 1 tablet by mouth once daily Patient taking  differently: Take 10 mg by mouth daily. 06/07/19  Yes Tower, Audrie GallusMarne A, MD  dicyclomine (BENTYL) 20 MG tablet Take 1 tablet (20 mg total) by mouth 2 (two) times daily. 02/25/20  Yes Sabas SousBero, Michael M, MD  lisinopril (ZESTRIL) 10 MG tablet Take 2 tablets (20 mg total) by mouth daily. Patient taking differently: Take 20 mg by mouth daily. 05/16/20  Yes Tower, Audrie GallusMarne A, MD  metoprolol succinate (TOPROL-XL) 100 MG 24 hr tablet Take 1 tablet (100 mg total) by mouth daily. Take with or immediately following a meal. 05/16/20 08/14/20 Yes Tower, Audrie GallusMarne A, MD  Multiple Vitamin (MULTIVITAMIN WITH MINERALS) TABS tablet Take 1 tablet by mouth daily. 12/26/19  Yes Alwyn RenMathews, Elizabeth G, MD  omeprazole (PRILOSEC) 20 MG capsule Take 1 capsule by mouth once daily 03/01/20  Yes Tower, Audrie GallusMarne A, MD  ondansetron (ZOFRAN) 4 MG tablet Take 1 tablet (4 mg total) by mouth every 6 (six) hours as needed for nausea. 12/26/19  Yes Alwyn RenMathews, Elizabeth G, MD  sertraline (ZOLOFT) 50 MG tablet TAKE 1 TABLET BY MOUTH ONCE DAILY FOR 2 WEEKS THEN INCREASE TO 1&1/2 TABLETS FOR 2 WEEKS THEN 2 TABS DAILY Patient taking differently: Take 100 mg by mouth daily. 05/15/20  Yes Tower, Audrie GallusMarne A, MD  sucralfate (CARAFATE) 1 GM/10ML suspension Take 10 mLs (1 g total) by mouth 4 (four) times daily. Patient taking differently: Take 1 g by mouth 4 (four) times daily as needed (For constipation). 06/21/19  Yes Armbruster, Willaim RayasSteven P, MD  chlordiazePOXIDE (LIBRIUM) 10 MG capsule 1 tab two times daily for 1 day 1 tab once daily for 1 day Patient not taking: Reported on 05/19/2020 04/19/20   Dorcas CarrowGhimire, Kuber, MD  mirtazapine (REMERON) 15 MG tablet TAKE 1 TABLET BY MOUTH AT BEDTIME Patient not taking: Reported on 05/19/2020 05/02/19   Judy Pimpleower, Marne A, MD    Physical Exam:  Constitutional: Middle-age female who appears to be in some Vitals:   05/19/20 1245 05/19/20 1300 05/19/20 1315 05/19/20 1330  BP: (!) 159/106 (!) 156/101 (!) 162/102 (!) 165/143  Pulse: (!) 110 (!) 107  (!) 109 (!) 108  Resp: 16 15 16 15   Temp:      SpO2: 96% 96% 96% 97%  Weight:      Height:       Eyes: PERRL, lids and conjunctivae normal ENMT: Mucous membranes are dry. Posterior pharynx clear of any exudate or lesions.   Neck: normal, supple, no masses, no thyromegaly Respiratory: clear to auscultation bilaterally, no wheezing, no crackles. Normal respiratory effort. No accessory muscle use.  Cardiovascular: Tachycardic, no murmurs / rubs / gallops. No extremity edema. 2+ pedal pulses. No carotid bruits.  Abdomen: Epigastric abdominal tenderness. no masses palpated. No hepatosplenomegaly. Bowel sounds positive.  Musculoskeletal: no clubbing / cyanosis. No joint deformity upper and lower extremities. Good ROM, no contractures. Normal muscle tone.  Skin: no rashes, lesions, ulcers. No induration Neurologic: CN 2-12 grossly intact.  Able to move all extremities but  appears tremulous. Psychiatric:  Alert and oriented x 3.  Anxious mood.     Labs on Admission: I have personally reviewed following labs and imaging studies  CBC: Recent Labs  Lab 05/19/20 0955 05/19/20 1204  WBC 10.9*  --   HGB 10.8* 9.9*  HCT 34.8* 29.0*  MCV 82.5  --   PLT 212  --    Basic Metabolic Panel: Recent Labs  Lab 05/19/20 0955 05/19/20 1204  NA 136 138  K 3.6 3.5  CL 83*  --   CO2 22  --   GLUCOSE 187*  --   BUN 15  --   CREATININE 3.59*  --   CALCIUM 9.6  --    GFR: Estimated Creatinine Clearance: 17.2 mL/min (A) (by C-G formula based on SCr of 3.59 mg/dL (H)). Liver Function Tests: Recent Labs  Lab 05/19/20 1106  AST 119*  ALT 66*  ALKPHOS 40  BILITOT 0.7  PROT 6.9  ALBUMIN 4.1   Recent Labs  Lab 05/19/20 1106  LIPASE 259*   No results for input(s): AMMONIA in the last 168 hours. Coagulation Profile: No results for input(s): INR, PROTIME in the last 168 hours. Cardiac Enzymes: Recent Labs  Lab 05/19/20 1142  CKTOTAL 233   BNP (last 3 results) No results for input(s):  PROBNP in the last 8760 hours. HbA1C: No results for input(s): HGBA1C in the last 72 hours. CBG: No results for input(s): GLUCAP in the last 168 hours. Lipid Profile: No results for input(s): CHOL, HDL, LDLCALC, TRIG, CHOLHDL, LDLDIRECT in the last 72 hours. Thyroid Function Tests: No results for input(s): TSH, T4TOTAL, FREET4, T3FREE, THYROIDAB in the last 72 hours. Anemia Panel: No results for input(s): VITAMINB12, FOLATE, FERRITIN, TIBC, IRON, RETICCTPCT in the last 72 hours. Urine analysis:    Component Value Date/Time   COLORURINE YELLOW 05/19/2020 1229   APPEARANCEUR CLOUDY (A) 05/19/2020 1229   LABSPEC 1.017 05/19/2020 1229   PHURINE 5.0 05/19/2020 1229   GLUCOSEU 50 (A) 05/19/2020 1229   HGBUR MODERATE (A) 05/19/2020 1229   BILIRUBINUR NEGATIVE 05/19/2020 1229   BILIRUBINUR trace 12/20/2012 1553   KETONESUR NEGATIVE 05/19/2020 1229   PROTEINUR 100 (A) 05/19/2020 1229   UROBILINOGEN 0.2 06/22/2014 1147   NITRITE NEGATIVE 05/19/2020 1229   LEUKOCYTESUR NEGATIVE 05/19/2020 1229   Sepsis Labs: No results found for this or any previous visit (from the past 240 hour(s)).   Radiological Exams on Admission: DG Chest 2 View  Result Date: 05/19/2020 CLINICAL DATA:  Chest pain.  Detox from alcohol. EXAM: CHEST - 2 VIEW COMPARISON:  04/16/2020 FINDINGS: Stable elevation of the RIGHT hemidiaphragm. Heart size is normal. The lungs are clear. IMPRESSION: No active cardiopulmonary disease. Electronically Signed   By: Norva Pavlov M.D.   On: 05/19/2020 10:59    EKG: Independently reviewed.  Sinus tachycardia 152 bpm evaluate  Assessment/Plan Alcohol withdrawal: Acute.  Patient noted to be tachycardic and tremulous on physical exam.  She has been drinking 1/5 of whiskey on average per day and had to stop drinking almost 2 days ago.  Alcohol levels were noted to be negative. -Admit to a progressive bed -CIWA protocols with scheduled and as needed Ativan every hour if needed -Check  magnesium, phosphorus level -Thiamine, multivitamin, and folate -Continue counseling on the need of cessation of ethanol use.   Abdominal pain, nausea,  and vomiting secondary to  Pancreatitis: Acute.  Patient complains of having epigastric abdominal pain with radiation to her back with nausea and vomiting.  Lipase elevated at 259.  On physical exam she does have tenderness.  She clinically appears to have pancreatitis.  -Aspiration precautions  -Elevate head of the bed -Advance diet as tolerated -Normal saline IV fluids at 200 mL/h -IV morphine as needed for pain -Tigan given IM x1 dose for nausea given prolonged QT  Acute renal failure: Patient presents with creatinine elevated up to 3.59 with BUN 15.  Creatinine previously had been she reports that she has not recently urinated. -Strict I&O's -Check renal ultrasound -IV fluids as seen above -Continue to monitor kidney  Hypertensive urgency: Acute.  On admission systolic blood pressures elevated into the 190s.  Home blood pressure medications include lisinopril 20 mg daily, amlodipine 10 mg daily, and metoprolol 100 mg daily. -Continue metoprolol and amlodipine -Hold lisinopril due to acute kidney injury  Chest pain: Patient reported complaints of chest pain.  Cardiac enzymes are negative x2 and EKG without significant ischemic changes. -Continue to monitor  Prolonged QT interval: QTC 508. -Avoiding QT prolonging medication -Recheck EKG  Metabolic acidosis with elevated anion gap lactic acidosis: Acute.  Patient presented with CO2 22, glucose  187, anion gap  31.  Question possibility of alcoholic ketoacidosis however urine ketones were negative. -Serial BMPs and lactic acid level  Normocytic anemia: Chronic.  Hemoglobin 10.8 g/dL on admission which is slightly higher than previous baseline likely due to the patient being dehydrated.  Denies any reports of bleeding. -Continue to monitor H&H  Transaminitis: Acute.  On admission  AST 119 and ALT 66.  AST to ALT ratio consistent with alcohol abuse.  Suspect secondary to patient's alcohol use. -Continue to monitor  Anxiety and depression -Restart Zoloft when medically appropriate  GERD -Carafate due to prolonged QT interval  DVT prophylaxis: Heparin. Code Status: Full Family Communication: None Disposition Plan: To be determined Consults called: None Admission status: Inpatient, require more than 2 midnight stay for aggressive IV fluid rehydration  Clydie Braun MD [facility]   If 7PM-7AM, please contact night-coverage   05/19/2020, 1:47 PM

## 2020-05-19 NOTE — Progress Notes (Signed)
   05/19/20 1630  Assess: MEWS Score  Temp 99.4 F (37.4 C)  BP (!) 168/104  Pulse Rate (!) 111  ECG Heart Rate (!) 111  Resp 18  Level of Consciousness Alert  SpO2 99 %  O2 Device Room Air  Assess: MEWS Score  MEWS Temp 0  MEWS Systolic 0  MEWS Pulse 2  MEWS RR 0  MEWS LOC 0  MEWS Score 2  MEWS Score Color Yellow  Assess: if the MEWS score is Yellow or Red  Were vital signs taken at a resting state? Yes  Focused Assessment No change from prior assessment  Early Detection of Sepsis Score *See Row Information* Medium  MEWS guidelines implemented *See Row Information* Yes  Treat  Pain Scale 0-10  Pain Score 0  Take Vital Signs  Increase Vital Sign Frequency  Yellow: Q 2hr X 2 then Q 4hr X 2, if remains yellow, continue Q 4hrs  Escalate  MEWS: Escalate Yellow: discuss with charge nurse/RN and consider discussing with provider and RRT  Notify: Charge Nurse/RN  Name of Charge Nurse/RN Notified Eduardo Osier, RN  Date Charge Nurse/RN Notified 05/19/20  Time Charge Nurse/RN Notified [id number]  Notify: Provider  Provider Name/Title Madelyn Flavors  Date Provider Notified 05/19/20  Time Provider Notified 1632  Notification Type Page  Notification Reason  (yellow mews)  Provider response No new orders  Date of Provider Response 05/19/20  Time of Provider Response 1635  Document  Patient Outcome Stabilized after interventions  Progress note created (see row info) Yes

## 2020-05-20 LAB — COMPREHENSIVE METABOLIC PANEL
ALT: 64 U/L — ABNORMAL HIGH (ref 0–44)
AST: 138 U/L — ABNORMAL HIGH (ref 15–41)
Albumin: 3.7 g/dL (ref 3.5–5.0)
Alkaline Phosphatase: 38 U/L (ref 38–126)
Anion gap: 8 (ref 5–15)
BUN: 12 mg/dL (ref 6–20)
CO2: 29 mmol/L (ref 22–32)
Calcium: 8.1 mg/dL — ABNORMAL LOW (ref 8.9–10.3)
Chloride: 101 mmol/L (ref 98–111)
Creatinine, Ser: 1.12 mg/dL — ABNORMAL HIGH (ref 0.44–1.00)
GFR, Estimated: >60 mL/min (ref >60)
Glucose, Bld: 84 mg/dL (ref 70–99)
Potassium: 3.5 mmol/L (ref 3.5–5.1)
Sodium: 138 mmol/L (ref 135–145)
Total Bilirubin: 0.4 mg/dL (ref 0.3–1.2)
Total Protein: 6.3 g/dL — ABNORMAL LOW (ref 6.5–8.1)

## 2020-05-20 LAB — CBC
HCT: 25.3 % — ABNORMAL LOW (ref 36.0–46.0)
Hemoglobin: 7.9 g/dL — ABNORMAL LOW (ref 12.0–15.0)
MCH: 25.8 pg — ABNORMAL LOW (ref 26.0–34.0)
MCHC: 31.2 g/dL (ref 30.0–36.0)
MCV: 82.7 fL (ref 80.0–100.0)
Platelets: 101 10*3/uL — ABNORMAL LOW (ref 150–400)
RBC: 3.06 MIL/uL — ABNORMAL LOW (ref 3.87–5.11)
RDW: 20.5 % — ABNORMAL HIGH (ref 11.5–15.5)
WBC: 6.4 10*3/uL (ref 4.0–10.5)
nRBC: 0 % (ref 0.0–0.2)

## 2020-05-20 LAB — MAGNESIUM: Magnesium: 2.2 mg/dL (ref 1.7–2.4)

## 2020-05-20 MED ORDER — SERTRALINE HCL 100 MG PO TABS
100.0000 mg | ORAL_TABLET | Freq: Every day | ORAL | Status: DC
  Administered 2020-05-20 – 2020-05-22 (×3): 100 mg via ORAL
  Filled 2020-05-20 (×3): qty 1

## 2020-05-20 NOTE — Social Work (Signed)
CSW met with pt to address SA consult. Pt was amenable to receiving resources and informed CSW that she had started counseling this week.CSW encouraged pt to review resources. 

## 2020-05-20 NOTE — Plan of Care (Signed)

## 2020-05-20 NOTE — Progress Notes (Signed)
Pt asking if she has order for Remeron which she has taken PTA. Pt states she hasn't taken it in almost 2 months. Pt does not have order for Remeron but is currently on CIWA protocol with IV ativan and is noted to have been sleeping since last dose given. I talked to patient and we discussed asking rounding MD in am, since this is something she has not been taking at home regularly. Will continue to monitor. Dierdre Highman, RN

## 2020-05-20 NOTE — Progress Notes (Signed)
PROGRESS NOTE    Nicole Hughes  ION:[id number] DOB: April 22, 1970 DOA: 05/19/2020 PCP: Judy Pimpleower, Marne A, MD    Brief Narrative:  Nicole Hughes is a 50 year old female with past medical history significant for EtOH abuse, history of DVT/PE following childbirth, esophageal ulcer, anxiety/depression who presents to [facility] ED on 5/14 with chief complaints of nausea, vomiting and abdominal pain.  She reports has been drinking approximately 1/5 of whiskey per day after the passing of her father in March and following recent surgery.  Last reported drink roughly 48 hours ago.  Since her last drink, she reports not been doing very well with complaints of a sharp epigastric abdominal pain that radiates towards her back.  Emesis reported as nonbloody in appearance.  No bowel movement in 2 days but is passing flatus.  No fever/chills.  In the ED, temperature 98.3 F, HR 151, RR 26, BP 176/162, SPO2 95% on room air.  Sodium 136, potassium 3.6, chloride 83, CO2 22, glucose 187, BUN 15, creatinine 3.59.  Lipase 259.  WBC 10.9, hemoglobin 10.8, platelets 212.  AST 119, ALT 66.  Total bilirubin 0.5.  CK 233.  Troponin 17.  Lactic acid 3.2. EtOH <10, salicylate <7.0.  hCG less than 5.0.  Her initial CIWA score was noted to be 19.  Patient was given 2 L IV normal saline bolus, Ativan and started on CIWA protocol.  TRH consulted for admission and further evaluation and management of EtOH pancreatitis, EtOH withdrawal, and acute renal failure.    Assessment & Plan:   Active Problems:   Pancreatitis   Depression with anxiety   Elevated LFTs   Intractable nausea and vomiting   Alcohol withdrawal (HCC)   Hypertensive urgency   Prolonged QT interval   Alcohol withdrawal: Acute.   Patient noted to be tachycardic and tremulous on physical exam.  She has been drinking 1/5 of whiskey on average per day and had to stop drinking almost 2 days prior to admission.  Alcohol levels were noted to be  negative. --Continue CIWA protocols with scheduled and as needed Ativan every hour if needed --Thiamine, multivitamin, and folate --Continue counseling on the need of cessation of ethanol use.  EtOH pancreatitis  Patient complains of having epigastric abdominal pain with radiation to her back with nausea and vomiting.  Lipase elevated at 259.  On physical exam she does have tenderness with radiation to her back which is consistent with pancreatitis. --continue IVF hydration with NS at 24000mL/hr --Clear liquid diet, advance as tolerates --IV morphine as needed for pain -- EtOH cessation  Acute renal failure:  Patient presents with creatinine elevated up to 3.59 with BUN 15.  Creatinine previously had been normal.  She reports that she has not recently urinated.  Renal ultrasound unremarkable. --Cr 3.59>>1.12 --continue IVF hydration as above --Strict I&O's --BMP daily  Hypertensive urgency: Acute.   On admission systolic blood pressures elevated into the 190s.  Home blood pressure medications include lisinopril 20 mg daily, amlodipine 10 mg daily, and metoprolol 100 mg daily.  Etiology likely secondary to acute alcohol withdrawals --Continue metoprolol 100mg  PO and amlodipine 10mg  PO daily --hydralazine 10mg  IV q4h prn SBP >180 or DBP --continue to hold lisinopril due to acute kidney injury  Chest pain: Patient reported complaints of chest pain.  Cardiac enzymes are negative x2 and EKG without significant ischemic changes. -Continue to monitor on telemetry  Prolonged QT interval:  --QTC 508. --Avoiding QT prolonging medication  Metabolic acidosis with elevated anion  gap lactic acidosis: Acute.   Patient presented with CO2 22, glucose  187, anion gap  31.  Question possibility of alcoholic ketoacidosis however urine ketones were negative. --lactic acid 3.2>1.6 --treatment as above --BMP daily  Normocytic anemia: Chronic.  Hemoglobin 10.8 g/dL on admission which is slightly  higher than previous baseline likely due to the patient being dehydrated.  Denies any reports of bleeding. -Continue to monitor H&H  Transaminitis: Acute.   On admission AST 119 and ALT 66.  AST to ALT ratio consistent with alcohol abuse.  -CMP daily  Anxiety and depression --Zoloft 100 mg PO daily  GERD --Carafate due to prolonged QT interval   DVT prophylaxis: Heparin   Code Status: Full Code Family Communication: No family present at bedside  Disposition Plan:  Level of care: Progressive Status is: Inpatient  Remains inpatient appropriate because:Ongoing diagnostic testing needed not appropriate for outpatient work up, Unsafe d/c plan, IV treatments appropriate due to intensity of illness or inability to take PO and Inpatient level of care appropriate due to severity of illness   Dispo: The patient is from: Home              Anticipated d/c is to: Home              Patient currently is not medically stable to d/c.   Difficult to place patient No    Consultants:   None  Procedures:   Renal ultrasound  Antimicrobials:   None   Subjective: Patient is seen and examined at bedside, resting comfortably.  Abdominal pain improved.  No further nausea/vomiting.  Tolerating clear liquid diet.  Complains of some left back musculoskeletal pain following recent fall at home.  No other questions or concerns at this time.  Denies headache, no visual changes, no current chest pain/palpitations, no shortness of breath, no fever/chills/night sweats, no nausea/vomiting/diarrhea, no weakness, no fatigue, no paresthesias.  No acute events overnight per nursing staff.  Objective: Vitals:   05/19/20 1842 05/19/20 2025 05/20/20 0530 05/20/20 0729  BP: (!) 149/103 (!) 146/73 (!) 141/90 136/74  Pulse: 96 (!) 112 99   Resp:  17 18 17   Temp:  97.6 F (36.4 C) 97.6 F (36.4 C)   TempSrc:  Oral Oral   SpO2: 96%  95%   Weight:      Height:        Intake/Output Summary (Last 24  hours) at 05/20/2020 1135 Last data filed at 05/20/2020 0300 Gross per 24 hour  Intake 1496.61 ml  Output --  Net 1496.61 ml   Filed Weights   05/19/20 1019  Weight: 66.2 kg    Examination:  General exam: Appears calm and comfortable  Respiratory system: Clear to auscultation. Respiratory effort normal. Cardiovascular system: S1 & S2 heard, RRR. No JVD, murmurs, rubs, gallops or clicks. No pedal edema. Gastrointestinal system: Abdomen is nondistended, soft and nontender. No organomegaly or masses felt. Normal bowel sounds heard. Central nervous system: Alert and oriented. No focal neurological deficits. Extremities: Symmetric 5 x 5 power. Skin: No rashes, lesions or ulcers Psychiatry: Judgement and insight appear normal. Mood & affect appropriate.     Data Reviewed: I have personally reviewed following labs and imaging studies  CBC: Recent Labs  Lab 05/19/20 0955 05/19/20 1204 05/20/20 0302  WBC 10.9*  --  6.4  HGB 10.8* 9.9* 7.9*  HCT 34.8* 29.0* 25.3*  MCV 82.5  --  82.7  PLT 212  --  101*   Basic Metabolic Panel:  Recent Labs  Lab 05/19/20 0955 05/19/20 1204 05/19/20 1657 05/19/20 1934 05/20/20 0302 05/20/20 0857  NA 136 138 138 137 138  --   K 3.6 3.5 3.5 3.7 3.5  --   CL 83*  --  98 101 101  --   CO2 22  --  28 26 29   --   GLUCOSE 187*  --  95 84 84  --   BUN 15  --  15 16 12   --   CREATININE 3.59*  --  1.90* 1.68* 1.12*  --   CALCIUM 9.6  --  7.9* 7.7* 8.1*  --   MG  --   --  1.4*  --   --  2.2  PHOS  --   --  3.8  --   --   --    GFR: Estimated Creatinine Clearance: 55.3 mL/min (A) (by C-G formula based on SCr of 1.12 mg/dL (H)). Liver Function Tests: Recent Labs  Lab 05/19/20 1106 05/20/20 0302  AST 119* 138*  ALT 66* 64*  ALKPHOS 40 38  BILITOT 0.7 0.4  PROT 6.9 6.3*  ALBUMIN 4.1 3.7   Recent Labs  Lab 05/19/20 1106  LIPASE 259*   No results for input(s): AMMONIA in the last 168 hours. Coagulation Profile: No results for input(s):  INR, PROTIME in the last 168 hours. Cardiac Enzymes: Recent Labs  Lab 05/19/20 1142  CKTOTAL 233   BNP (last 3 results) No results for input(s): PROBNP in the last 8760 hours. HbA1C: No results for input(s): HGBA1C in the last 72 hours. CBG: No results for input(s): GLUCAP in the last 168 hours. Lipid Profile: No results for input(s): CHOL, HDL, LDLCALC, TRIG, CHOLHDL, LDLDIRECT in the last 72 hours. Thyroid Function Tests: No results for input(s): TSH, T4TOTAL, FREET4, T3FREE, THYROIDAB in the last 72 hours. Anemia Panel: No results for input(s): VITAMINB12, FOLATE, FERRITIN, TIBC, IRON, RETICCTPCT in the last 72 hours. Sepsis Labs: Recent Labs  Lab 05/19/20 1142 05/19/20 1342  LATICACIDVEN 3.2* 1.6    Recent Results (from the past 240 hour(s))  Resp Panel by RT-PCR (Flu A&B, Covid) Nasopharyngeal Swab     Status: None   Collection Time: 05/19/20  4:13 PM   Specimen: Nasopharyngeal Swab; Nasopharyngeal(NP) swabs in vial transport medium  Result Value Ref Range Status   SARS Coronavirus 2 by RT PCR NEGATIVE NEGATIVE Final    Comment: (NOTE) SARS-CoV-2 target nucleic acids are NOT DETECTED.  The SARS-CoV-2 RNA is generally detectable in upper respiratory specimens during the acute phase of infection. The lowest concentration of SARS-CoV-2 viral copies this assay can detect is 138 copies/mL. A negative result does not preclude SARS-Cov-2 infection and should not be used as the sole basis for treatment or other patient management decisions. A negative result may occur with  improper specimen collection/handling, submission of specimen other than nasopharyngeal swab, presence of viral mutation(s) within the areas targeted by this assay, and inadequate number of viral copies(<138 copies/mL). A negative result must be combined with clinical observations, patient history, and epidemiological information. The expected result is Negative.  Fact Sheet for Patients:   05/21/20  Fact Sheet for Healthcare Providers:  05/21/20  This test is no t yet approved or cleared by the [employer] FDA and  has been authorized for detection and/or diagnosis of SARS-CoV-2 by FDA under an Emergency Use Authorization (EUA). This EUA will remain  in effect (meaning this test can be used) for the duration of the COVID-19 declaration  under Section 564(b)(1) of the Act, 21 U.S.C.section 360bbb-3(b)(1), unless the authorization is terminated  or revoked sooner.       Influenza A by PCR NEGATIVE NEGATIVE Final   Influenza B by PCR NEGATIVE NEGATIVE Final    Comment: (NOTE) The Xpert Xpress SARS-CoV-2/FLU/RSV plus assay is intended as an aid in the diagnosis of influenza from Nasopharyngeal swab specimens and should not be used as a sole basis for treatment. Nasal washings and aspirates are unacceptable for Xpert Xpress SARS-CoV-2/FLU/RSV testing.  Fact Sheet for Patients: [employer]  Fact Sheet for Healthcare Providers: [employer]it  This test is not yet approved or cleared by the [place] FDA and has been authorized for detection and/or diagnosis of SARS-CoV-2 by FDA under an Emergency Use Authorization (EUA). This EUA will remain in effect (meaning this test can be used) for the duration of the COVID-19 declaration under Section 564(b)(1) of the Act, 21 U.S.C. section 360bbb-3(b)(1), unless the authorization is terminated or revoked.  Performed at [facility], [address] [place], [place] [address]          Radiology Studies: DG Chest 2 View  Result Date: 05/19/2020 CLINICAL DATA:  Chest pain.  Detox from alcohol. EXAM: CHEST - 2 VIEW COMPARISON:  04/16/2020 FINDINGS: Stable elevation of the RIGHT hemidiaphragm. Heart size is normal. The lungs are clear. IMPRESSION: No active cardiopulmonary disease.  Electronically Signed   By: Norva Pavlov M.D.   On: 05/19/2020 10:59   US RENAL  Result Date: 05/19/2020 CLINICAL DATA:  Acute renal failure EXAM: RENAL / URINARY TRACT ULTRASOUND COMPLETE COMPARISON:  CT abdomen pelvis 02/25/2020 FINDINGS: Right Kidney: Renal measurements: 12.2 x 4.8 x 4.9 cm = volume: 152 mL. Echogenicity within normal limits. No mass or hydronephrosis visualized. Left Kidney: Renal measurements: 12.3 x 5.6 x 4.4 cm = volume: 160 mL. Echogenicity within normal limits. No mass or hydronephrosis visualized. Bladder: Appears normal for degree of bladder distention. Other: None. IMPRESSION: Unremarkable sonographic appearance of the bilateral kidneys. Electronically Signed   By: Emmaline Kluver M.D.   On: 05/19/2020 15:03        Scheduled Meds: . amLODipine  10 mg Oral Daily  . folic acid  1 mg Oral Daily  . heparin  5,000 Units Subcutaneous Q8H  . LORazepam  0-4 mg Intravenous Q6H   Or  . LORazepam  0-4 mg Oral Q6H  . [START ON 05/21/2020] LORazepam  0-4 mg Intravenous Q12H   Or  . [START ON 05/21/2020] LORazepam  0-4 mg Oral Q12H  . metoprolol succinate  100 mg Oral Daily  . multivitamin with minerals  1 tablet Oral Daily  . sodium chloride flush  3 mL Intravenous Q12H  . sucralfate  1 g Oral QID  . thiamine  100 mg Oral Daily   Or  . thiamine  100 mg Intravenous Daily   Continuous Infusions: . sodium chloride 200 mL/hr at 05/20/20 0905     LOS: 1 day    Time spent: 41 minutes spent on chart review, discussion with nursing staff, consultants, updating family and interview/physical exam; more than 50% of that time was spent in counseling and/or coordination of care.    Alvira Philips [place], DO [facility] Available via Epic secure chat 7am-7pm After these hours, please refer to coverage provider listed on [email or web address] 05/20/2020, 11:35 AM

## 2020-05-21 LAB — COMPREHENSIVE METABOLIC PANEL
ALT: 82 U/L — ABNORMAL HIGH (ref 0–44)
AST: 178 U/L — ABNORMAL HIGH (ref 15–41)
Albumin: 3.5 g/dL (ref 3.5–5.0)
Alkaline Phosphatase: 42 U/L (ref 38–126)
Anion gap: 7 (ref 5–15)
BUN: <5 mg/dL — ABNORMAL LOW (ref 6–20)
CO2: 26 mmol/L (ref 22–32)
Calcium: 8.1 mg/dL — ABNORMAL LOW (ref 8.9–10.3)
Chloride: 103 mmol/L (ref 98–111)
Creatinine, Ser: 0.57 mg/dL (ref 0.44–1.00)
GFR, Estimated: >60 mL/min (ref >60)
Glucose, Bld: 85 mg/dL (ref 70–99)
Potassium: 2.7 mmol/L — CL (ref 3.5–5.1)
Sodium: 136 mmol/L (ref 135–145)
Total Bilirubin: 0.9 mg/dL (ref 0.3–1.2)
Total Protein: 6.3 g/dL — ABNORMAL LOW (ref 6.5–8.1)

## 2020-05-21 LAB — MAGNESIUM: Magnesium: 1.3 mg/dL — ABNORMAL LOW (ref 1.7–2.4)

## 2020-05-21 LAB — CBC
HCT: 26.7 % — ABNORMAL LOW (ref 36.0–46.0)
Hemoglobin: 8.3 g/dL — ABNORMAL LOW (ref 12.0–15.0)
MCH: 25.6 pg — ABNORMAL LOW (ref 26.0–34.0)
MCHC: 31.1 g/dL (ref 30.0–36.0)
MCV: 82.4 fL (ref 80.0–100.0)
Platelets: 91 10*3/uL — ABNORMAL LOW (ref 150–400)
RBC: 3.24 MIL/uL — ABNORMAL LOW (ref 3.87–5.11)
RDW: 19.7 % — ABNORMAL HIGH (ref 11.5–15.5)
WBC: 4.5 10*3/uL (ref 4.0–10.5)
nRBC: 0 % (ref 0.0–0.2)

## 2020-05-21 LAB — LIPASE, BLOOD: Lipase: 60 U/L — ABNORMAL HIGH (ref 11–51)

## 2020-05-21 MED ORDER — MAGNESIUM SULFATE 4 GM/100ML IV SOLN
4.0000 g | Freq: Once | INTRAVENOUS | Status: AC
  Administered 2020-05-21: 4 g via INTRAVENOUS
  Filled 2020-05-21: qty 100

## 2020-05-21 MED ORDER — POTASSIUM CHLORIDE 20 MEQ PO PACK
40.0000 meq | PACK | ORAL | Status: AC
  Administered 2020-05-21 (×2): 40 meq via ORAL
  Filled 2020-05-21 (×3): qty 2

## 2020-05-21 MED ORDER — LISINOPRIL 20 MG PO TABS
20.0000 mg | ORAL_TABLET | Freq: Every day | ORAL | Status: DC
  Administered 2020-05-21 – 2020-05-22 (×2): 20 mg via ORAL
  Filled 2020-05-21 (×2): qty 1

## 2020-05-21 MED ORDER — POTASSIUM CHLORIDE 20 MEQ PO PACK
40.0000 meq | PACK | Freq: Once | ORAL | Status: AC
  Administered 2020-05-21: 40 meq via ORAL
  Filled 2020-05-21: qty 2

## 2020-05-21 NOTE — TOC Progression Note (Signed)
Transition of Care [facility]) - Progression Note    Patient Details  Name: Nicole Hughes MRN: [id number] Date of Birth: May 20, 1970  [facility] of [facility]) CM/SW Contact  Ivette Loyal, [place] Phone Number: 05/21/2020, 3:38 PM  Clinical Narrative:    3:00pm- [facility] AID complete; CSW spoke with pt about substance use, pt explained she has been working with someone at he church that has been helping to find resources for her substance abuse pt explained she has private insurance through [employer] with her spouse but they are waiting for new cards with new plan. CSW explained to pt that she can create [facility] user login and search inpatient substance  counseling or therapy and all in-network coverage would show. CSW provided resources for inpatient, outpatient, and virtual substance counseling and shared that there are services available with no insurance, pt stated that she would share this information with church member to help in counseling search and call around. Pt says husband is supportive and will be her transportation home.        Expected Discharge Plan and Services                                                 Social Determinants of Health (SDOH) Interventions    Readmission Risk Interventions No flowsheet data found.

## 2020-05-21 NOTE — Progress Notes (Addendum)
PROGRESS NOTE    Nicole Hughes  LNL:[id number] DOB: 1970-08-02 DOA: 05/19/2020 PCP: Judy Pimple, MD    Brief Narrative:  Nicole Hughes is a 50 year old female with past medical history significant for EtOH abuse, history of DVT/PE following childbirth, esophageal ulcer, anxiety/depression who presents to [facility], ED on 5/14 with chief complaints of nausea, vomiting and abdominal pain.  She reports has been drinking approximately 1/5 of whiskey per day after the passing of her father in March and following recent surgery.  Last reported drink roughly 48 hours ago.  Since her last drink, she reports not been doing very well with complaints of a sharp epigastric abdominal pain that radiates towards her back.  Emesis reported as nonbloody in appearance.  No bowel movement in 2 days but is passing flatus.  No fever/chills.  In the ED, temperature 98.3 F, HR 151, RR 26, BP 176/162, SPO2 95% on room air.  Sodium 136, potassium 3.6, chloride 83, CO2 22, glucose 187, BUN 15, creatinine 3.59.  Lipase 259.  WBC 10.9, hemoglobin 10.8, platelets 212.  AST 119, ALT 66.  Total bilirubin 0.5.  CK 233.  Troponin 17.  Lactic acid 3.2. EtOH <10, salicylate <7.0.  hCG less than 5.0.  Her initial CIWA score was noted to be 19.  Patient was given 2 L IV normal saline bolus, Ativan and started on CIWA protocol.  TRH consulted for admission and further evaluation and management of EtOH pancreatitis, EtOH withdrawal, and acute renal failure.    Assessment & Plan:   Active Problems:   Pancreatitis   Depression with anxiety   Elevated LFTs   Intractable nausea and vomiting   Alcohol withdrawal (HCC)   Hypertensive urgency   Prolonged QT interval   Alcohol withdrawal: Acute.   Patient noted to be tachycardic and tremulous on physical exam.  She has been drinking 1/5th of whiskey on average per day and had to stop drinking almost 2 days prior to admission.  Alcohol levels were noted to be  negative. --Continue CIWA protocols with scheduled and as needed Ativan every hour if needed --Thiamine, multivitamin, and folate --Continue counseling on the need of cessation of ethanol use.  EtOH pancreatitis  Patient complains of having epigastric abdominal pain with radiation to her back with nausea and vomiting.  Lipase elevated at 259.  On physical exam she does have tenderness with radiation to her back which is consistent with pancreatitis. --continue IVF hydration with NS at 45mL/hr --Clear liquid diet, advance to soft diet today --IV morphine as needed for pain --EtOH cessation  Acute renal failure:  Resolved Patient presents with creatinine elevated up to 3.59 with BUN 15.  Creatinine previously had been normal.  She reports that she has not recently urinated.  Renal ultrasound unremarkable. --Cr 3.59>>1.12>0.57 --continue IVF hydration as above --Strict I&O's --BMP daily  Hypertensive urgency: Acute.   On admission systolic blood pressures elevated into the 190s.  Home blood pressure medications include lisinopril 20 mg daily, amlodipine 10 mg daily, and metoprolol 100 mg daily.  Etiology likely secondary to acute alcohol withdrawals --Continue metoprolol 100mg  PO and amlodipine 10mg  PO daily --restart lisinopril today now that renal function has normalized --hydralazine 10mg  IV q4h prn SBP >180 or DBP  Chest pain: Patient reported complaints of chest pain.  Cardiac enzymes are negative x2 and EKG without significant ischemic changes. -Continue to monitor on telemetry  Prolonged QT interval:  --QTC 508. --Avoiding QT prolonging medication  Metabolic acidosis with  elevated anion gap lactic acidosis: Acute.   Patient presented with CO2 22, glucose  187, anion gap  31.  Question possibility of alcoholic ketoacidosis however urine ketones were negative. --lactic acid 3.2>1.6 --treatment as above --BMP daily  Normocytic anemia: Chronic.  Hemoglobin 10.8 g/dL on  admission which is slightly higher than previous baseline likely due to the patient being dehydrated.  Denies any reports of bleeding. -Continue to monitor H&H  Transaminitis:  On admission AST 119 and ALT 66.  AST to ALT ratio consistent with alcohol abuse.  --CMP daily  Hypokalemia Hypomagnesmia Magnesium 1.3, potassium 2.7 this morning.  Will replete.  Anxiety and depression --Zoloft 100 mg PO daily  GERD --Carafate due to prolonged QT interval   DVT prophylaxis: SCDs (heparin discontinued due to ecchymosis to injection sites with low platelets at 91)   Code Status: Full Code Family Communication: No family present at bedside  Disposition Plan:  Level of care: Med-Surg Status is: Inpatient  Remains inpatient appropriate because:Ongoing diagnostic testing needed not appropriate for outpatient work up, Unsafe d/c plan, IV treatments appropriate due to intensity of illness or inability to take PO and Inpatient level of care appropriate due to severity of illness   Dispo: The patient is from: Home              Anticipated d/c is to: Home              Patient currently is not medically stable to d/c.   Difficult to place patient No    Consultants:   None  Procedures:   Renal ultrasound  Antimicrobials:   None   Subjective: Patient is seen and examined at bedside, resting comfortably.  Abdominal pain resolved.  Tolerating full liquid diet and wishes to advance diet if further today.  Potassium and magnesium low, will replete him today.  No other questions or concerns at this time. Denies headache, no visual changes, no current chest pain/palpitations, no shortness of breath, no fever/chills/night sweats, no nausea/vomiting/diarrhea, no weakness, no fatigue, no paresthesias.  No acute events overnight per nursing staff.  Objective: Vitals:   05/21/20 0404 05/21/20 0452 05/21/20 0850 05/21/20 0852  BP: (!) 152/102   (!) 146/106  Pulse:  96 79   Resp: 18      Temp: 98 F (36.7 C)     TempSrc: Oral     SpO2: 96%     Weight:      Height:        Intake/Output Summary (Last 24 hours) at 05/21/2020 1112 Last data filed at 05/20/2020 2215 Gross per 24 hour  Intake 1499.85 ml  Output 1000 ml  Net 499.85 ml   Filed Weights   05/19/20 1019  Weight: 66.2 kg    Examination:  General exam: Appears calm and comfortable  Respiratory system: Clear to auscultation. Respiratory effort normal. Cardiovascular system: S1 & S2 heard, RRR. No JVD, murmurs, rubs, gallops or clicks. No pedal edema. Gastrointestinal system: Abdomen is nondistended, soft and nontender. No organomegaly or masses felt. Normal bowel sounds heard. Central nervous system: Alert and oriented. No focal neurological deficits. Extremities: Symmetric 5 x 5 power. Skin: No rashes, lesions or ulcers Psychiatry: Judgement and insight appear normal. Mood & affect appropriate.     Data Reviewed: I have personally reviewed following labs and imaging studies  CBC: Recent Labs  Lab 05/19/20 0955 05/19/20 1204 05/20/20 0302 05/21/20 0305  WBC 10.9*  --  6.4 4.5  HGB 10.8* 9.9* 7.9* 8.3*  HCT 34.8* 29.0* 25.3* 26.7*  MCV 82.5  --  82.7 82.4  PLT 212  --  101* 91*   Basic Metabolic Panel: Recent Labs  Lab 05/19/20 0955 05/19/20 1204 05/19/20 1657 05/19/20 1934 05/20/20 0302 05/20/20 0857 05/21/20 0305  NA 136 138 138 137 138  --  136  K 3.6 3.5 3.5 3.7 3.5  --  2.7*  CL 83*  --  98 101 101  --  103  CO2 22  --  28 26 29   --  26  GLUCOSE 187*  --  95 84 84  --  85  BUN 15  --  15 16 12   --  <5*  CREATININE 3.59*  --  1.90* 1.68* 1.12*  --  0.57  CALCIUM 9.6  --  7.9* 7.7* 8.1*  --  8.1*  MG  --   --  1.4*  --   --  2.2 1.3*  PHOS  --   --  3.8  --   --   --   --    GFR: Estimated Creatinine Clearance: 77.4 mL/min (by C-G formula based on SCr of 0.57 mg/dL). Liver Function Tests: Recent Labs  Lab 05/19/20 1106 05/20/20 0302 05/21/20 0305  AST 119* 138* 178*   ALT 66* 64* 82*  ALKPHOS 40 38 42  BILITOT 0.7 0.4 0.9  PROT 6.9 6.3* 6.3*  ALBUMIN 4.1 3.7 3.5   Recent Labs  Lab 05/19/20 1106 05/21/20 0305  LIPASE 259* 60*   No results for input(s): AMMONIA in the last 168 hours. Coagulation Profile: No results for input(s): INR, PROTIME in the last 168 hours. Cardiac Enzymes: Recent Labs  Lab 05/19/20 1142  CKTOTAL 233   BNP (last 3 results) No results for input(s): PROBNP in the last 8760 hours. HbA1C: No results for input(s): HGBA1C in the last 72 hours. CBG: No results for input(s): GLUCAP in the last 168 hours. Lipid Profile: No results for input(s): CHOL, HDL, LDLCALC, TRIG, CHOLHDL, LDLDIRECT in the last 72 hours. Thyroid Function Tests: No results for input(s): TSH, T4TOTAL, FREET4, T3FREE, THYROIDAB in the last 72 hours. Anemia Panel: No results for input(s): VITAMINB12, FOLATE, FERRITIN, TIBC, IRON, RETICCTPCT in the last 72 hours. Sepsis Labs: Recent Labs  Lab 05/19/20 1142 05/19/20 1342  LATICACIDVEN 3.2* 1.6    Recent Results (from the past 240 hour(s))  Resp Panel by RT-PCR (Flu A&B, Covid) Nasopharyngeal Swab     Status: None   Collection Time: 05/19/20  4:13 PM   Specimen: Nasopharyngeal Swab; Nasopharyngeal(NP) swabs in vial transport medium  Result Value Ref Range Status   SARS Coronavirus 2 by RT PCR NEGATIVE NEGATIVE Final    Comment: (NOTE) SARS-CoV-2 target nucleic acids are NOT DETECTED.  The SARS-CoV-2 RNA is generally detectable in upper respiratory specimens during the acute phase of infection. The lowest concentration of SARS-CoV-2 viral copies this assay can detect is 138 copies/mL. A negative result does not preclude SARS-Cov-2 infection and should not be used as the sole basis for treatment or other patient management decisions. A negative result may occur with  improper specimen collection/handling, submission of specimen other than nasopharyngeal swab, presence of viral mutation(s) within  the areas targeted by this assay, and inadequate number of viral copies(<138 copies/mL). A negative result must be combined with clinical observations, patient history, and epidemiological information. The expected result is Negative.  Fact Sheet for Patients:  05/21/20  Fact Sheet for Healthcare Providers:  05/21/20  This test is no  t yet approved or cleared by the [place] FDA and  has been authorized for detection and/or diagnosis of SARS-CoV-2 by FDA under an Emergency Use Authorization (EUA). This EUA will remain  in effect (meaning this test can be used) for the duration of the COVID-19 declaration under Section 564(b)(1) of the Act, 21 U.S.C.section 360bbb-3(b)(1), unless the authorization is terminated  or revoked sooner.       Influenza A by PCR NEGATIVE NEGATIVE Final   Influenza B by PCR NEGATIVE NEGATIVE Final    Comment: (NOTE) The Xpert Xpress SARS-CoV-2/FLU/RSV plus assay is intended as an aid in the diagnosis of influenza from Nasopharyngeal swab specimens and should not be used as a sole basis for treatment. Nasal washings and aspirates are unacceptable for Xpert Xpress SARS-CoV-2/FLU/RSV testing.  Fact Sheet for Patients: BloggerCourse.comhttps://www.fda.gov/media/152166/download  Fact Sheet for Healthcare Providers: SeriousBroker.ithttps://www.fda.gov/media/152162/download  This test is not yet approved or cleared by the [place] FDA and has been authorized for detection and/or diagnosis of SARS-CoV-2 by FDA under an Emergency Use Authorization (EUA). This EUA will remain in effect (meaning this test can be used) for the duration of the COVID-19 declaration under Section 564(b)(1) of the Act, 21 U.S.C. section 360bbb-3(b)(1), unless the authorization is terminated or revoked.  Performed at [facility], [address] [place], [place] [address]          Radiology Studies: US RENAL  Result  Date: 05/19/2020 CLINICAL DATA:  Acute renal failure EXAM: RENAL / URINARY TRACT ULTRASOUND COMPLETE COMPARISON:  CT abdomen pelvis 02/25/2020 FINDINGS: Right Kidney: Renal measurements: 12.2 x 4.8 x 4.9 cm = volume: 152 mL. Echogenicity within normal limits. No mass or hydronephrosis visualized. Left Kidney: Renal measurements: 12.3 x 5.6 x 4.4 cm = volume: 160 mL. Echogenicity within normal limits. No mass or hydronephrosis visualized. Bladder: Appears normal for degree of bladder distention. Other: None. IMPRESSION: Unremarkable sonographic appearance of the bilateral kidneys. Electronically Signed   By: Emmaline KluverNancy  Ballantyne M.D.   On: 05/19/2020 15:03        Scheduled Meds: . amLODipine  10 mg Oral Daily  . folic acid  1 mg Oral Daily  . lisinopril  20 mg Oral Daily  . LORazepam  0-4 mg Intravenous Q6H   Or  . LORazepam  0-4 mg Oral Q6H  . LORazepam  0-4 mg Intravenous Q12H   Or  . LORazepam  0-4 mg Oral Q12H  . metoprolol succinate  100 mg Oral Daily  . multivitamin with minerals  1 tablet Oral Daily  . potassium chloride  40 mEq Oral Q4H  . sertraline  100 mg Oral Daily  . sodium chloride flush  3 mL Intravenous Q12H  . sucralfate  1 g Oral QID  . thiamine  100 mg Oral Daily   Or  . thiamine  100 mg Intravenous Daily   Continuous Infusions: . sodium chloride 75 mL/hr at 05/21/20 0610     LOS: 2 days    Time spent: 38 minutes spent on chart review, discussion with nursing staff, consultants, updating family and interview/physical exam; more than 50% of that time was spent in counseling and/or coordination of care.    Alvira PhilipsEric J [place], DO [facility] Available via Epic secure chat 7am-7pm After these hours, please refer to coverage provider listed on [email or web address] 05/21/2020, 11:12 AM

## 2020-05-21 NOTE — Progress Notes (Signed)
Critical Lab result: potassium 2.7 down from 3.5 yesterday. Mg 1.3 down from 2.2 yesterday. Notified Provider on call via page, Dr. Arville Care.  Maryan Puls, RN 5.16.22 [phone]

## 2020-05-21 NOTE — Progress Notes (Addendum)
Pt noted to have moderate bruising to abdomen-pt receiving SQ Heparin TID. labs reviewed and Pltc noted to be 101 and Hgb 7.9. Will add CBC to am labs and hold am heparin pending labs. Dierdre Highman, RN   Provider on call, Dr Arville Care, paged via [facility] to inform of above. Dierdre Highman, RN

## 2020-05-22 ENCOUNTER — Telehealth: Payer: Self-pay

## 2020-05-22 DIAGNOSIS — K852 Alcohol induced acute pancreatitis without necrosis or infection: Secondary | ICD-10-CM

## 2020-05-22 DIAGNOSIS — F10230 Alcohol dependence with withdrawal, uncomplicated: Secondary | ICD-10-CM

## 2020-05-22 LAB — COMPREHENSIVE METABOLIC PANEL
ALT: 104 U/L — ABNORMAL HIGH (ref 0–44)
AST: 193 U/L — ABNORMAL HIGH (ref 15–41)
Albumin: 3.6 g/dL (ref 3.5–5.0)
Alkaline Phosphatase: 47 U/L (ref 38–126)
Anion gap: 6 (ref 5–15)
BUN: <5 mg/dL — ABNORMAL LOW (ref 6–20)
CO2: 24 mmol/L (ref 22–32)
Calcium: 8.8 mg/dL — ABNORMAL LOW (ref 8.9–10.3)
Chloride: 105 mmol/L (ref 98–111)
Creatinine, Ser: 0.71 mg/dL (ref 0.44–1.00)
GFR, Estimated: >60 mL/min (ref >60)
Glucose, Bld: 96 mg/dL (ref 70–99)
Potassium: 4.4 mmol/L (ref 3.5–5.1)
Sodium: 135 mmol/L (ref 135–145)
Total Bilirubin: 0.7 mg/dL (ref 0.3–1.2)
Total Protein: 6.3 g/dL — ABNORMAL LOW (ref 6.5–8.1)

## 2020-05-22 LAB — CBC
HCT: 28.6 % — ABNORMAL LOW (ref 36.0–46.0)
Hemoglobin: 8.6 g/dL — ABNORMAL LOW (ref 12.0–15.0)
MCH: 25.2 pg — ABNORMAL LOW (ref 26.0–34.0)
MCHC: 30.1 g/dL (ref 30.0–36.0)
MCV: 83.9 fL (ref 80.0–100.0)
Platelets: 118 10*3/uL — ABNORMAL LOW (ref 150–400)
RBC: 3.41 MIL/uL — ABNORMAL LOW (ref 3.87–5.11)
RDW: 19.3 % — ABNORMAL HIGH (ref 11.5–15.5)
WBC: 4 10*3/uL (ref 4.0–10.5)
nRBC: 0 % (ref 0.0–0.2)

## 2020-05-22 LAB — MAGNESIUM: Magnesium: 2 mg/dL (ref 1.7–2.4)

## 2020-05-22 MED ORDER — LORAZEPAM 1 MG PO TABS: 1.0000 mg | ORAL_TABLET | Freq: Three times a day (TID) | ORAL | 0 refills | Status: AC | PRN

## 2020-05-22 NOTE — Telephone Encounter (Signed)
Transition Care Management Unsuccessful Follow-up Telephone Call  Date of discharge and from where:  05/22/2020, [facility]  Attempts:  2nd Attempt  Reason for unsuccessful TCM follow-up call:  No answer/busy

## 2020-05-22 NOTE — Discharge Summary (Signed)
Physician Discharge Summary  Nicole Hughes NWG:[id number] DOB: [birth year] DOA: 05/19/2020  PCP: Judy Pimple, MD  Admit date: 05/19/2020 Discharge date: 05/22/2020  Admitted From: Home  Disposition: Home    Recommendations for Outpatient Follow-up:  1. Follow up with PCP in 1-2 weeks 2. Consider up titration of Zoloft 3. Please obtain CMP/CBC in one week 4. Continue to encourage alcohol cessation  Home Health: No Equipment/Devices: None  Discharge Condition: Stable CODE STATUS: Full code Diet recommendation: Heart healthy diet  History of present illness:  Nicole Hughes is a 50 year old female with past medical history significant for EtOH abuse, history of DVT/PE following childbirth, esophageal ulcer, anxiety/depression who presents to [facility], ED on 5/14 with chief complaints of nausea, vomiting and abdominal pain.  She reports has been drinking approximately 1/5 of whiskey per day after the passing of her father in March and following recent surgery.  Last reported drink roughly 48 hours ago.  Since her last drink, she reports not been doing very well with complaints of a sharp epigastric abdominal pain that radiates towards her back.  Emesis reported as nonbloody in appearance.  No bowel movement in 2 days but is passing flatus.  No fever/chills.  In the ED, temperature 98.3 F, HR 151, RR 26, BP 176/162, SPO2 95% on room air.  Sodium 136, potassium 3.6, chloride 83, CO2 22, glucose 187, BUN 15, creatinine 3.59.  Lipase 259.  WBC 10.9, hemoglobin 10.8, platelets 212.  AST 119, ALT 66.  Total bilirubin 0.5.  CK 233.  Troponin 17.  Lactic acid 3.2. EtOH <10, salicylate <7.0.  hCG less than 5.0.  Her initial CIWA score was noted to be 19.  Patient was given 2 L IV normal saline bolus, Ativan and started on CIWA protocol.  TRH consulted for admission and further evaluation and management of EtOH pancreatitis, EtOH withdrawal, and acute renal failure.  Hospital course:  Alcohol  withdrawal: Acute.  Patient noted to be tachycardic and tremulous on physical exam.She has been drinking 1/5th of whiskey on average per day and had to stop drinking almost 2 days prior to admission. Alcohol levels were noted to be negative. Patient was monitored on CIWA protocols with Ativan as needed.  Patient's withdrawals have subsided.  Continue Ativan 1 mg p.o. 3 times daily as needed following discharge.  Outpatient follow-up with substance abuse programs.  EtOH pancreatitis Patient complains of having epigastric abdominal pain with radiation to her back with nausea and vomiting. Lipase elevated at 259. On physical exam she does have tenderness with radiation to her back which is consistent with pancreatitis.  Patient was started on aggressive IV fluid hydration and clear liquid diet that was slowly advanced with toleration.  Patient's symptoms have resolved.  Discussed once again regarding EtOH cessation and was given substance abuse programs by social work.  Acute renal failure: Resolved Patient presents with creatinine elevated up to 3.59 with BUN 15. Creatinine previously had been normal.  She reports that she has not recently urinated.  Renal ultrasound unremarkable.  Creatinine improved to 0.71 at time of discharge.  Hypertensive urgency:Resolved On admission systolic blood pressures elevated into the 190s. Home blood pressure medications include lisinopril 20 mg daily, amlodipine 10 mg daily, and metoprolol 100 mg daily.  Etiology likely secondary to acute alcohol withdrawals.  Patient's home metoprolol 100 mg p.o. daily, amlodipine 10 mg p.o. daily and lisinopril 10 mg p.o. daily have been restarted with improvement of her blood pressure.  Patient's blood  pressure 139/88 at time of discharge.  Outpatient follow-up with PCP for further surveillance.  Atypical chest pain: Patient reported complaints of chest pain. Cardiac enzymes are negative x2 and EKG without significant  ischemic changes.  No significant findings noted on telemetry during hospitalization.  Prolonged QT interval: QTC 508. Avoiding QT prolonging medication.  Recommend repeat EKG outpatient  Metabolic acidosis with elevated anion gaplactic acidosis:  Patient presented with CO2 22, glucose 187,anion gap 31. Question possibility of alcoholic ketoacidosis however urine ketones were negative.  Normocytic anemia/thrombocytopenia: Chronic.  Hemoglobin 10.8 g/dL on admission which is slightly higher than previous baseline likely due to the patient being dehydrated. Also with associated thrombocytopenia.  Etiology likely secondary to chronic alcohol abuse. Denies any reports of bleeding.  Hemoglobin 8.6 at time of discharge.  CBC 1 week.  Transaminitis:  Elevated AST/ALT ratio 2:1, consistent with EtOH abuse.  Continue to encourage alcohol cessation.  Seen by social work with information regarding outpatient substance abuse programs.  Repeat CMP 1 week.  Hypokalemia Hypomagnesmia Repleted during hospitalization.  Potassium 4.4 and magnesium 2.0 at time of discharge.  CMP 1 week.  Anxiety and depression Zoloft 100 mg PO daily.  Outpatient follow-up with PCP for consideration of further up titration. Discharging with Ativan 1 mg p.o. 3 times daily as needed for anxiety and withdrawal symptoms.  GERD Carafate prn due to prolonged QT interval  Discharge Diagnoses:  Active Problems:   Depression with anxiety   Elevated LFTs   Prolonged QT interval    Discharge Instructions  Discharge Instructions    Call MD for:  difficulty breathing, headache or visual disturbances   Complete by: As directed    Call MD for:  extreme fatigue   Complete by: As directed    Call MD for:  persistant dizziness or light-headedness   Complete by: As directed    Call MD for:  persistant nausea and vomiting   Complete by: As directed    Call MD for:  severe uncontrolled pain   Complete by: As  directed    Call MD for:  temperature >100.4   Complete by: As directed    Diet - low sodium heart healthy   Complete by: As directed    Increase activity slowly   Complete by: As directed      Allergies as of 05/22/2020   No Known Allergies     Medication List    STOP taking these medications   chlordiazePOXIDE 10 MG capsule Commonly known as: LIBRIUM   mirtazapine 15 MG tablet Commonly known as: REMERON     TAKE these medications   amLODipine 10 MG tablet Commonly known as: NORVASC Take 1 tablet by mouth once daily   dicyclomine 20 MG tablet Commonly known as: BENTYL Take 1 tablet (20 mg total) by mouth 2 (two) times daily.   lisinopril 10 MG tablet Commonly known as: ZESTRIL Take 2 tablets (20 mg total) by mouth daily.   LORazepam 1 MG tablet Commonly known as: Ativan Take 1 tablet (1 mg total) by mouth every 8 (eight) hours as needed for up to 7 days for anxiety.   metoprolol succinate 100 MG 24 hr tablet Commonly known as: TOPROL-XL Take 1 tablet (100 mg total) by mouth daily. Take with or immediately following a meal.   multivitamin with minerals Tabs tablet Take 1 tablet by mouth daily.   omeprazole 20 MG capsule Commonly known as: PRILOSEC Take 1 capsule by mouth once daily   ondansetron 4  MG tablet Commonly known as: ZOFRAN Take 1 tablet (4 mg total) by mouth every 6 (six) hours as needed for nausea.   sertraline 50 MG tablet Commonly known as: ZOLOFT TAKE 1 TABLET BY MOUTH ONCE DAILY FOR 2 WEEKS THEN INCREASE TO 1&1/2 TABLETS FOR 2 WEEKS THEN 2 TABS DAILY What changed: See the new instructions.   sucralfate 1 GM/10ML suspension Commonly known as: CARAFATE Take 10 mLs (1 g total) by mouth 4 (four) times daily. What changed:   when to take this  reasons to take this       Follow-up Information    Tower, Audrie GallusMarne A, MD. Schedule an appointment as soon as possible for a visit in 1 week(s).   Specialties: Family Medicine, Radiology Contact  information: [address] [place] [address] [phone]              No Known Allergies  Consultations:  None   Procedures/Studies: DG Chest 2 View  Result Date: 05/19/2020 CLINICAL DATA:  Chest pain.  Detox from alcohol. EXAM: CHEST - 2 VIEW COMPARISON:  04/16/2020 FINDINGS: Stable elevation of the RIGHT hemidiaphragm. Heart size is normal. The lungs are clear. IMPRESSION: No active cardiopulmonary disease. Electronically Signed   By: Norva PavlovElizabeth  Brown M.D.   On: 05/19/2020 10:59   US RENAL  Result Date: 05/19/2020 CLINICAL DATA:  Acute renal failure EXAM: RENAL / URINARY TRACT ULTRASOUND COMPLETE COMPARISON:  CT abdomen pelvis 02/25/2020 FINDINGS: Right Kidney: Renal measurements: 12.2 x 4.8 x 4.9 cm = volume: 152 mL. Echogenicity within normal limits. No mass or hydronephrosis visualized. Left Kidney: Renal measurements: 12.3 x 5.6 x 4.4 cm = volume: 160 mL. Echogenicity within normal limits. No mass or hydronephrosis visualized. Bladder: Appears normal for degree of bladder distention. Other: None. IMPRESSION: Unremarkable sonographic appearance of the bilateral kidneys. Electronically Signed   By: Emmaline KluverNancy  Ballantyne M.D.   On: 05/19/2020 15:03      Subjective: Patient seen examined bedside, resting comfortably.  Abdominal pain has resolved.  Tolerating diet without any further nausea/vomiting.  No more withdrawal symptoms.  Ready for discharge home.  Discussed need for complete cessation once again and to follow-up with PCP in 1-2 weeks with repeat labs.  Social work has given patient resources for substance abuse counseling outpatient.  No other questions or concerns at this time.  Denies headache, no fever/chills/night sweats, no nausea/vomiting/diarrhea, no chest pain, no palpitations, no shortness of breath, no abdominal pain, no weakness, no fatigue, no paresthesias.  No acute events overnight per nursing.  Discharge Exam: Vitals:   05/21/20 1951 05/22/20  0458  BP: 138/84 139/88  Pulse: 89   Resp: 19 18  Temp:  98.5 F (36.9 C)  SpO2: 98% 97%   Vitals:   05/21/20 0852 05/21/20 1144 05/21/20 1951 05/22/20 0458  BP: (!) 146/106 137/89 138/84 139/88  Pulse:   89   Resp:  19 19 18   Temp:    98.5 F (36.9 C)  TempSrc:    Oral  SpO2:   98% 97%  Weight:      Height:        General: Pt is alert, awake, not in acute distress Cardiovascular: RRR, S1/S2 +, no rubs, no gallops Respiratory: CTA bilaterally, no wheezing, no rhonchi Abdominal: Soft, NT, ND, bowel sounds + Extremities: no edema, no cyanosis    The results of significant diagnostics from this hospitalization (including imaging, microbiology, ancillary and laboratory) are listed below for reference.  Microbiology: Recent Results (from the past 240 hour(s))  Resp Panel by RT-PCR (Flu A&B, Covid) Nasopharyngeal Swab     Status: None   Collection Time: 05/19/20  4:13 PM   Specimen: Nasopharyngeal Swab; Nasopharyngeal(NP) swabs in vial transport medium  Result Value Ref Range Status   SARS Coronavirus 2 by RT PCR NEGATIVE NEGATIVE Final    Comment: (NOTE) SARS-CoV-2 target nucleic acids are NOT DETECTED.  The SARS-CoV-2 RNA is generally detectable in upper respiratory specimens during the acute phase of infection. The lowest concentration of SARS-CoV-2 viral copies this assay can detect is 138 copies/mL. A negative result does not preclude SARS-Cov-2 infection and should not be used as the sole basis for treatment or other patient management decisions. A negative result may occur with  improper specimen collection/handling, submission of specimen other than nasopharyngeal swab, presence of viral mutation(s) within the areas targeted by this assay, and inadequate number of viral copies(<138 copies/mL). A negative result must be combined with clinical observations, patient history, and epidemiological information. The expected result is Negative.  Fact Sheet for  Patients:  BloggerCourse.com  Fact Sheet for Healthcare Providers:  SeriousBroker.it  This test is no t yet approved or cleared by the [place] FDA and  has been authorized for detection and/or diagnosis of SARS-CoV-2 by FDA under an Emergency Use Authorization (EUA). This EUA will remain  in effect (meaning this test can be used) for the duration of the COVID-19 declaration under Section 564(b)(1) of the Act, 21 U.S.C.section 360bbb-3(b)(1), unless the authorization is terminated  or revoked sooner.       Influenza A by PCR NEGATIVE NEGATIVE Final   Influenza B by PCR NEGATIVE NEGATIVE Final    Comment: (NOTE) The Xpert Xpress SARS-CoV-2/FLU/RSV plus assay is intended as an aid in the diagnosis of influenza from Nasopharyngeal swab specimens and should not be used as a sole basis for treatment. Nasal washings and aspirates are unacceptable for Xpert Xpress SARS-CoV-2/FLU/RSV testing.  Fact Sheet for Patients: BloggerCourse.com  Fact Sheet for Healthcare Providers: SeriousBroker.it  This test is not yet approved or cleared by the [place] FDA and has been authorized for detection and/or diagnosis of SARS-CoV-2 by FDA under an Emergency Use Authorization (EUA). This EUA will remain in effect (meaning this test can be used) for the duration of the COVID-19 declaration under Section 564(b)(1) of the Act, 21 U.S.C. section 360bbb-3(b)(1), unless the authorization is terminated or revoked.  Performed at [facility], [address] [place], [place] [address]      Labs: BNP (last 3 results) No results for input(s): BNP in the last 8760 hours. Basic Metabolic Panel: Recent Labs  Lab 05/19/20 1657 05/19/20 1934 05/20/20 0302 05/20/20 0857 05/21/20 0305 05/22/20 0256  NA 138 137 138  --  136 135  K 3.5 3.7 3.5  --  2.7* 4.4  CL 98 101 101  --  103 105   CO2 28 26 29   --  26 24  GLUCOSE 95 84 84  --  85 96  BUN 15 16 12   --  <5* <5*  CREATININE 1.90* 1.68* 1.12*  --  0.57 0.71  CALCIUM 7.9* 7.7* 8.1*  --  8.1* 8.8*  MG 1.4*  --   --  2.2 1.3* 2.0  PHOS 3.8  --   --   --   --   --    Liver Function Tests: Recent Labs  Lab 05/19/20 1106 05/20/20 0302 05/21/20 0305 05/22/20 0256  AST 119* 138* 178* 193*  ALT 66* 64* 82* 104*  ALKPHOS 40 38 42 47  BILITOT 0.7 0.4 0.9 0.7  PROT 6.9 6.3* 6.3* 6.3*  ALBUMIN 4.1 3.7 3.5 3.6   Recent Labs  Lab 05/19/20 1106 05/21/20 0305  LIPASE 259* 60*   No results for input(s): AMMONIA in the last 168 hours. CBC: Recent Labs  Lab 05/19/20 0955 05/19/20 1204 05/20/20 0302 05/21/20 0305 05/22/20 0256  WBC 10.9*  --  6.4 4.5 4.0  HGB 10.8* 9.9* 7.9* 8.3* 8.6*  HCT 34.8* 29.0* 25.3* 26.7* 28.6*  MCV 82.5  --  82.7 82.4 83.9  PLT 212  --  101* 91* 118*   Cardiac Enzymes: Recent Labs  Lab 05/19/20 1142  CKTOTAL 233   BNP: Invalid input(s): POCBNP CBG: No results for input(s): GLUCAP in the last 168 hours. D-Dimer No results for input(s): DDIMER in the last 72 hours. Hgb A1c No results for input(s): HGBA1C in the last 72 hours. Lipid Profile No results for input(s): CHOL, HDL, LDLCALC, TRIG, CHOLHDL, LDLDIRECT in the last 72 hours. Thyroid function studies No results for input(s): TSH, T4TOTAL, T3FREE, THYROIDAB in the last 72 hours.  Invalid input(s): FREET3 Anemia work up No results for input(s): VITAMINB12, FOLATE, FERRITIN, TIBC, IRON, RETICCTPCT in the last 72 hours. Urinalysis    Component Value Date/Time   COLORURINE YELLOW 05/19/2020 1229   APPEARANCEUR CLOUDY (A) 05/19/2020 1229   LABSPEC 1.017 05/19/2020 1229   PHURINE 5.0 05/19/2020 1229   GLUCOSEU 50 (A) 05/19/2020 1229   HGBUR MODERATE (A) 05/19/2020 1229   BILIRUBINUR NEGATIVE 05/19/2020 1229   BILIRUBINUR trace 12/20/2012 1553   KETONESUR NEGATIVE 05/19/2020 1229   PROTEINUR 100 (A) 05/19/2020 1229    UROBILINOGEN 0.2 06/22/2014 1147   NITRITE NEGATIVE 05/19/2020 1229   LEUKOCYTESUR NEGATIVE 05/19/2020 1229   Sepsis Labs Invalid input(s): PROCALCITONIN,  WBC,  LACTICIDVEN Microbiology Recent Results (from the past 240 hour(s))  Resp Panel by RT-PCR (Flu A&B, Covid) Nasopharyngeal Swab     Status: None   Collection Time: 05/19/20  4:13 PM   Specimen: Nasopharyngeal Swab; Nasopharyngeal(NP) swabs in vial transport medium  Result Value Ref Range Status   SARS Coronavirus 2 by RT PCR NEGATIVE NEGATIVE Final    Comment: (NOTE) SARS-CoV-2 target nucleic acids are NOT DETECTED.  The SARS-CoV-2 RNA is generally detectable in upper respiratory specimens during the acute phase of infection. The lowest concentration of SARS-CoV-2 viral copies this assay can detect is 138 copies/mL. A negative result does not preclude SARS-Cov-2 infection and should not be used as the sole basis for treatment or other patient management decisions. A negative result may occur with  improper specimen collection/handling, submission of specimen other than nasopharyngeal swab, presence of viral mutation(s) within the areas targeted by this assay, and inadequate number of viral copies(<138 copies/mL). A negative result must be combined with clinical observations, patient history, and epidemiological information. The expected result is Negative.  Fact Sheet for Patients:  BloggerCourse.com  Fact Sheet for Healthcare Providers:  SeriousBroker.it  This test is no t yet approved or cleared by the [place] FDA and  has been authorized for detection and/or diagnosis of SARS-CoV-2 by FDA under an Emergency Use Authorization (EUA). This EUA will remain  in effect (meaning this test can be used) for the duration of the COVID-19 declaration under Section 564(b)(1) of the Act, 21 U.S.C.section 360bbb-3(b)(1), unless the authorization is terminated  or revoked  sooner.  Influenza A by PCR NEGATIVE NEGATIVE Final   Influenza B by PCR NEGATIVE NEGATIVE Final    Comment: (NOTE) The Xpert Xpress SARS-CoV-2/FLU/RSV plus assay is intended as an aid in the diagnosis of influenza from Nasopharyngeal swab specimens and should not be used as a sole basis for treatment. Nasal washings and aspirates are unacceptable for Xpert Xpress SARS-CoV-2/FLU/RSV testing.  Fact Sheet for Patients: BloggerCourse.com  Fact Sheet for Healthcare Providers: SeriousBroker.it  This test is not yet approved or cleared by the [place] FDA and has been authorized for detection and/or diagnosis of SARS-CoV-2 by FDA under an Emergency Use Authorization (EUA). This EUA will remain in effect (meaning this test can be used) for the duration of the COVID-19 declaration under Section 564(b)(1) of the Act, 21 U.S.C. section 360bbb-3(b)(1), unless the authorization is terminated or revoked.  Performed at [facility], [address], [place], [place] [address]      Time coordinating discharge: Over 30 minutes  SIGNED:   Alvira Philips [place], DO  [facility] 05/22/2020, 9:48 AM

## 2020-05-22 NOTE — Discharge Instructions (Signed)
Rethinking drinking (NIH Publication No. 15-3770). Retrieved from https://pubs.niaaa.nih.gov/publications/RethinkingDrinking/Rethinking_Drinking.pdf">   Alcohol Use Disorder  Alcohol use disorder is a condition in which drinking disrupts daily life. People with this condition drink too much alcohol and cannot control their drinking.  Alcohol use disorder can cause serious problems with physical health. It can affect the brain, heart, and other internal organs. This disorder can raise the risk for certain cancers and cause problems with mental health, such as depression or anxiety.  What are the causes?  This condition is caused by drinking too much alcohol over time. Some people with this condition drink to cope with or escape from negative life events. Others drink to relieve pain or symptoms of mental illness.  What increases the risk?  You are more likely to develop this condition if:  · You have a family history of alcohol use disorder.  · Your culture encourages drinking to the point of becoming drunk (intoxication).  · You had a mood or conduct disorder in childhood.  · You have been abused.  · You are an adolescent and you:  ? Have poor performance in school.  ? Have poor supervision or guidance.  ? Act on impulse and like taking risks.  What are the signs or symptoms?  Symptoms of this condition include:  · Drinking more than you want to.  · Trying several times without success to drink less.  · Spending a lot of time thinking about alcohol, getting alcohol, drinking, or recovering from drinking.  · Continuing to drink even when it is causing serious problems in your daily life.  · Drinking when it is dangerous to drink, such as before driving a car.  · Needing more and more alcohol to get the same effect you want (building up tolerance).  · Having symptoms of withdrawal when you stop drinking. Withdrawal symptoms may include:  ? Trouble sleeping, leading to tiredness (fatigue).  ? Mood swings of depression  and anxiety.  ? Physical symptoms, such as a fast heart rate, rapid breathing, high blood pressure (hypertension), fever, cold sweats, or nausea.  ? Seizures.  ? Severe confusion.  ? Feeling or seeing things that are not there (hallucinations).  ? Shaking movements that you cannot control (tremors).  How is this diagnosed?  This condition is diagnosed with an assessment. Your health care provider may start by asking three or four questions about your drinking, or he or she may give you a simple test to take. This helps to get clear information from you.  You may also have a physical exam or lab tests. You may be referred to a substance abuse counselor.  How is this treated?  With education, some people with alcohol use disorder are able to reduce their drinking. Many with this disorder cannot change their drinking behavior on their own and need help from substance use specialists. These specialists are counselors who can help diagnose how severe your disorder is and what type of treatment you need. Treatments may include:  · Detoxification. Detoxification involves quitting drinking with supervision and direction of health care providers. Your health care provider may prescribe prescription medicines within the first week to help lessen withdrawal symptoms. Alcohol withdrawal can be dangerous and life-threatening. Detoxification may be provided in a home, community, or primary care setting, or in a hospital or substance use treatment facility.  · Counseling. This may involve motivational interviewing (MI), family therapy, or cognitive behavioral therapy (CBT). It is provided by substance use treatment counselors or   professional therapists. A counselor can address the things you can do to change your drinking behavior and how to maintain the changes. Talk therapy aims to:  ? Identify your positive motivations to change.  ? Identify and avoid the things that trigger your drinking.  ? Help you learn how to plan your  behavior change.  ? Develop support systems that can help you sustain the change.  · Medicines. Medicines can help treat this disorder by:  ? Decreasing cravings.  ? Decreasing the positive feeling you have when you drink.  ? Causing an uncomfortable physical reaction when you drink (aversion therapy).  · Mutual help groups such as Alcoholics Anonymous (AA). These groups are led by people who have quit drinking. The groups provide emotional support, advice, and guidance.  Some people with this condition benefit from a combination of treatments provided by specialized substance use treatment centers.  Follow these instructions at home:    Medicines  · Take over-the-counter and prescription medicines only as told by your health care provider.  · Ask before starting any new medicines, herbs, or supplements.  General instructions  · Ask friends and family members to support your choice to stay sober.  · Avoid situations where alcohol is served.  · Create a plan to deal with tempting situations.  · Attend support groups regularly.  · Practice hobbies or activities you enjoy.  · Do not drink and drive.  · Keep all follow-up visits as told by your health care provider. This is important.  How is this prevented?  · If you drink alcohol:  ? Limit how much you use to:  § 0-1 drink a day for nonpregnant women.  § 0-2 drinks a day for men.  ? Be aware of how much alcohol is in your drink. In the [place] one drink equals one 12 oz bottle of beer (355 mL), one 5 oz glass of wine (148 mL), or one 1½ oz glass of hard liquor (44 mL).  · If you have a mental health condition, seek treatment. Develop a healthy lifestyle through:  ? Meditation or deep breathing.  ? Exercise.  ? Spending time in nature.  ? Listening to music.  ? Talking with a trusted friend or family member.  · If you are an adolescent:  ? Do not drink alcohol. Avoid gatherings where you might be tempted.  ? Do not be afraid to say no if someone offers you alcohol. Speak  up about why you do not want to drink. Set a positive example for others around you by not drinking.  ? Build relationships with friends who do not drink.  Where to find more information  · Substance Abuse and Mental Health Services Administration: samhsa.gov  · Alcoholics Anonymous: aa.org  Contact a health care provider if:  · You cannot take your medicines as told.  · Your symptoms get worse or you experience symptoms of withdrawal when you stop drinking.  · You start drinking again (relapse) and your symptoms get worse.  Get help right away if:  · You have thoughts about hurting yourself or others.  If you ever feel like you may hurt yourself or others, or have thoughts about taking your own life, get help right away. Go to your nearest emergency department or:  · Call your local emergency services (911 in the [place]  · Call a suicide crisis helpline, such as the National Suicide Prevention Lifeline at 1-800-273-8255. This is open 24 hours a day in   the [place]  · Text the Crisis Text Line at 741741 (in the [place]  Summary  · Alcohol use disorder is a condition in which drinking disrupts daily life. People with this condition drink too much alcohol and cannot control their drinking.  · Treatment may include detoxification, counseling, medicines, and support groups.  · Ask friends and family members to support you. Avoid situations where alcohol is served.  · Get help right away if you have thoughts about hurting yourself or others.  This information is not intended to replace advice given to you by your health care provider. Make sure you discuss any questions you have with your health care provider.  Document Revised: 11/11/2018 Document Reviewed: 11/11/2018  Elsevier Patient Education © 2021 [employer]      Acute Pancreatitis    The pancreas is a gland that is located behind the stomach on the left side of the abdomen. It produces enzymes that help to digest food. The pancreas also releases the hormones  glucagon and insulin, which help to regulate blood sugar. Acute pancreatitis happens when inflammation of the pancreas suddenly occurs and the pancreas becomes irritated and swollen.  Most acute attacks last a few days and cause serious problems. Some people become dehydrated and develop low blood pressure. In severe cases, bleeding in the abdomen can lead to shock and can be life-threatening. The lungs, heart, and kidneys may fail.  What are the causes?  This condition may be caused by:  · Alcohol abuse.  · Drug abuse.  · Gallstones or other conditions that can block the tube that drains the pancreas (pancreatic duct).  · A tumor in the pancreas.  Other causes include:  · Certain medicines.  · Exposure to certain chemicals.  · Diabetes.  · An infection in the pancreas.  · Damage caused by an accident (trauma).  · The poison (venom) from a scorpion bite.  · Abdominal surgery.  · Autoimmune pancreatitis. This is when the body's disease-fighting (immune) system attacks the pancreas.  · Genes that are passed from parent to child (inherited).  In some cases, the cause of this condition is not known.  What are the signs or symptoms?  Symptoms of this condition include:  · Pain in the upper abdomen that may radiate to the back. Pain may be severe.  · Tenderness and swelling of the abdomen.  · Nausea and vomiting.  · Fever.  How is this diagnosed?  This condition may be diagnosed based on:  · A physical exam.  · Blood tests.  · Imaging tests, such as X-rays, CT or MRI scans, or an ultrasound of the abdomen.  How is this treated?  Treatment for this condition usually requires a stay in the hospital. Treatment for this condition may include:  · Pain medicine.  · Fluid replacement through an IV.  · Placing a tube in the stomach to remove stomach contents and to control vomiting (NG tube, or nasogastric tube).  · Not eating for 3-4 days. This gives the pancreas a rest, because enzymes are not being produced that can cause  further damage.  · Antibiotic medicines, if your condition is caused by an infection.  · Treating any underlying conditions that may be the cause.  · Steroid medicines, if your condition is caused by your immune system attacking your body's own tissues (autoimmune disease).  · Surgery on the pancreas or gallbladder.  Follow these instructions at home:  Eating and drinking    · Follow   instructions from your health care provider about diet. This may involve avoiding alcohol and decreasing the amount of fat in your diet.  · Eat smaller, more frequent meals. This reduces the amount of digestive fluids that the pancreas produces.  · Drink enough fluid to keep your urine pale yellow.  · Do not drink alcohol if it caused your condition.  General instructions  · Take over-the-counter and prescription medicines only as told by your health care provider.  · Do not drive or use heavy machinery while taking prescription pain medicine.  · Ask your health care provider if the medicine prescribed to you can cause constipation. You may need to take steps to prevent or treat constipation, such as:  ? Take an over-the-counter or prescription medicine for constipation.  ? Eat foods that are high in fiber such as whole grains and beans.  ? Limit foods that are high in fat and processed sugars, such as fried or sweet foods.  · Do not use any products that contain nicotine or tobacco, such as cigarettes, e-cigarettes, and chewing tobacco. If you need help quitting, ask your health care provider.  · Get plenty of rest.  · If directed, check your blood sugar at home as told by your health care provider.  · Keep all follow-up visits as told by your health care provider. This is important.  Contact a health care provider if you:  · Do not recover as quickly as expected.  · Develop new or worsening symptoms.  · Have persistent pain, weakness, or nausea.  · Recover and then have another episode of pain.  · Have a fever.  Get help right away  if:  · You cannot eat or keep fluids down.  · Your pain becomes severe.  · Your skin or the white part of your eyes turns yellow (jaundice).  · You have sudden swelling in your abdomen.  · You vomit.  · You feel dizzy or you faint.  · Your blood sugar is high (over 300 mg/dL).  Summary  · Acute pancreatitis happens when inflammation of the pancreas suddenly occurs and the pancreas becomes irritated and swollen.  · This condition is typically caused by alcohol abuse, drug abuse, or gallstones.  · Treatment for this condition usually requires a stay in the hospital.  This information is not intended to replace advice given to you by your health care provider. Make sure you discuss any questions you have with your health care provider.  Document Revised: 10/12/2017 Document Reviewed: 06/29/2017  Elsevier Patient Education © 2021 [employer]

## 2020-05-22 NOTE — Telephone Encounter (Signed)
Transition Care Management Unsuccessful Follow-up Telephone Call  Date of discharge and from where:  05/22/2020, [facility]  Attempts:  1st Attempt  Reason for unsuccessful TCM follow-up call:  Left voice message

## 2020-05-23 NOTE — Telephone Encounter (Signed)
Transition Care Management Unsuccessful Follow-up Telephone Call  Date of discharge and from where:  05/22/2020, [facility]  Attempts:  3rd Attempt  Reason for unsuccessful TCM follow-up call:  Left voice message

## 2020-07-08 ENCOUNTER — Emergency Department (HOSPITAL_COMMUNITY): Payer: 59

## 2020-07-08 ENCOUNTER — Inpatient Hospital Stay (HOSPITAL_COMMUNITY)
Admission: EM | Admit: 2020-07-08 | Discharge: 2020-07-12 | DRG: 897 | Disposition: A | Discharge status: Home / Self Care | Payer: 59 | Attending: Family Medicine | Admitting: Family Medicine

## 2020-07-08 ENCOUNTER — Encounter (HOSPITAL_COMMUNITY): Payer: Self-pay | Admitting: Emergency Medicine

## 2020-07-08 ENCOUNTER — Other Ambulatory Visit: Payer: Self-pay

## 2020-07-08 DIAGNOSIS — K709 Alcoholic liver disease, unspecified: Secondary | ICD-10-CM | POA: Diagnosis present

## 2020-07-08 DIAGNOSIS — I10 Essential (primary) hypertension: Secondary | ICD-10-CM | POA: Diagnosis present

## 2020-07-08 DIAGNOSIS — G8929 Other chronic pain: Secondary | ICD-10-CM | POA: Diagnosis present

## 2020-07-08 DIAGNOSIS — D61818 Other pancytopenia: Secondary | ICD-10-CM | POA: Diagnosis present

## 2020-07-08 DIAGNOSIS — Z79899 Other long term (current) drug therapy: Secondary | ICD-10-CM | POA: Diagnosis not present

## 2020-07-08 DIAGNOSIS — D649 Anemia, unspecified: Secondary | ICD-10-CM | POA: Diagnosis present

## 2020-07-08 DIAGNOSIS — F10229 Alcohol dependence with intoxication, unspecified: Secondary | ICD-10-CM | POA: Diagnosis present

## 2020-07-08 DIAGNOSIS — N179 Acute kidney failure, unspecified: Secondary | ICD-10-CM | POA: Diagnosis not present

## 2020-07-08 DIAGNOSIS — F10239 Alcohol dependence with withdrawal, unspecified: Secondary | ICD-10-CM | POA: Diagnosis present

## 2020-07-08 DIAGNOSIS — K2100 Gastro-esophageal reflux disease with esophagitis, without bleeding: Secondary | ICD-10-CM | POA: Diagnosis present

## 2020-07-08 DIAGNOSIS — K580 Irritable bowel syndrome with diarrhea: Secondary | ICD-10-CM | POA: Diagnosis present

## 2020-07-08 DIAGNOSIS — K229 Disease of esophagus, unspecified: Secondary | ICD-10-CM | POA: Diagnosis not present

## 2020-07-08 DIAGNOSIS — Z8249 Family history of ischemic heart disease and other diseases of the circulatory system: Secondary | ICD-10-CM

## 2020-07-08 DIAGNOSIS — F10120 Alcohol abuse with intoxication, uncomplicated: Secondary | ICD-10-CM | POA: Diagnosis not present

## 2020-07-08 DIAGNOSIS — I429 Cardiomyopathy, unspecified: Secondary | ICD-10-CM | POA: Diagnosis present

## 2020-07-08 DIAGNOSIS — R000 Tachycardia, unspecified: Secondary | ICD-10-CM | POA: Diagnosis not present

## 2020-07-08 DIAGNOSIS — M545 Low back pain, unspecified: Secondary | ICD-10-CM | POA: Diagnosis present

## 2020-07-08 DIAGNOSIS — R1310 Dysphagia, unspecified: Secondary | ICD-10-CM | POA: Diagnosis present

## 2020-07-08 DIAGNOSIS — Z20822 Contact with and (suspected) exposure to covid-19: Secondary | ICD-10-CM | POA: Diagnosis present

## 2020-07-08 DIAGNOSIS — F10929 Alcohol use, unspecified with intoxication, unspecified: Secondary | ICD-10-CM

## 2020-07-08 DIAGNOSIS — Z86711 Personal history of pulmonary embolism: Secondary | ICD-10-CM

## 2020-07-08 DIAGNOSIS — E876 Hypokalemia: Secondary | ICD-10-CM | POA: Diagnosis not present

## 2020-07-08 DIAGNOSIS — Z86718 Personal history of other venous thrombosis and embolism: Secondary | ICD-10-CM | POA: Diagnosis not present

## 2020-07-08 DIAGNOSIS — F10939 Alcohol use, unspecified with withdrawal, unspecified: Secondary | ICD-10-CM

## 2020-07-08 LAB — CBC WITH DIFFERENTIAL/PLATELET
Abs Immature Granulocytes: 0.05 10*3/uL (ref 0.00–0.07)
Basophils Absolute: 0 10*3/uL (ref 0.0–0.1)
Basophils Relative: 1 %
Eosinophils Absolute: 0.4 10*3/uL (ref 0.0–0.5)
Eosinophils Relative: 7 %
HCT: 31.1 % — ABNORMAL LOW (ref 36.0–46.0)
Hemoglobin: 9.2 g/dL — ABNORMAL LOW (ref 12.0–15.0)
Immature Granulocytes: 1 %
Lymphocytes Relative: 4 %
Lymphs Abs: 0.3 10*3/uL — ABNORMAL LOW (ref 0.7–4.0)
MCH: 24.1 pg — ABNORMAL LOW (ref 26.0–34.0)
MCHC: 29.6 g/dL — ABNORMAL LOW (ref 30.0–36.0)
MCV: 81.6 fL (ref 80.0–100.0)
Monocytes Absolute: 0.5 10*3/uL (ref 0.1–1.0)
Monocytes Relative: 8 %
Neutro Abs: 4.9 10*3/uL (ref 1.7–7.7)
Neutrophils Relative %: 79 %
Platelets: 187 10*3/uL (ref 150–400)
RBC: 3.81 MIL/uL — ABNORMAL LOW (ref 3.87–5.11)
RDW: 22.3 % — ABNORMAL HIGH (ref 11.5–15.5)
WBC: 6.2 10*3/uL (ref 4.0–10.5)
nRBC: 0 % (ref 0.0–0.2)

## 2020-07-08 LAB — COMPREHENSIVE METABOLIC PANEL
ALT: 68 U/L — ABNORMAL HIGH (ref 0–44)
AST: 193 U/L — ABNORMAL HIGH (ref 15–41)
Albumin: 5.1 g/dL — ABNORMAL HIGH (ref 3.5–5.0)
Alkaline Phosphatase: 45 U/L (ref 38–126)
Anion gap: 24 — ABNORMAL HIGH (ref 5–15)
BUN: 11 mg/dL (ref 6–20)
CO2: 22 mmol/L (ref 22–32)
Calcium: 9.8 mg/dL (ref 8.9–10.3)
Chloride: 95 mmol/L — ABNORMAL LOW (ref 98–111)
Creatinine, Ser: 1.35 mg/dL — ABNORMAL HIGH (ref 0.44–1.00)
GFR, Estimated: 48 mL/min — ABNORMAL LOW (ref >60)
Glucose, Bld: 125 mg/dL — ABNORMAL HIGH (ref 70–99)
Potassium: 3.3 mmol/L — ABNORMAL LOW (ref 3.5–5.1)
Sodium: 141 mmol/L (ref 135–145)
Total Bilirubin: 1.7 mg/dL — ABNORMAL HIGH (ref 0.3–1.2)
Total Protein: 8.5 g/dL — ABNORMAL HIGH (ref 6.5–8.1)

## 2020-07-08 LAB — PROTIME-INR
INR: 1.1 (ref 0.8–1.2)
Prothrombin Time: 14.5 seconds (ref 11.4–15.2)

## 2020-07-08 LAB — CREATININE, SERUM
Creatinine, Ser: 1.02 mg/dL — ABNORMAL HIGH (ref 0.44–1.00)
GFR, Estimated: >60 mL/min (ref >60)

## 2020-07-08 LAB — LIPASE, BLOOD: Lipase: 74 U/L — ABNORMAL HIGH (ref 11–51)

## 2020-07-08 LAB — ETHANOL: Alcohol, Ethyl (B): <10 mg/dL (ref <10)

## 2020-07-08 LAB — I-STAT BETA HCG BLOOD, ED (MC, WL, AP ONLY): I-stat hCG, quantitative: <5 m[IU]/mL (ref <5)

## 2020-07-08 MED ORDER — DIAZEPAM 5 MG/ML IJ SOLN: 5.0000 mg | Freq: Three times a day (TID) | INTRAMUSCULAR | Status: DC | PRN

## 2020-07-08 MED ORDER — ONDANSETRON HCL 4 MG/2ML IJ SOLN
4.0000 mg | Freq: Once | INTRAMUSCULAR | Status: AC
  Administered 2020-07-08: 4 mg via INTRAVENOUS
  Filled 2020-07-08: qty 2

## 2020-07-08 MED ORDER — ADULT MULTIVITAMIN W/MINERALS CH
1.0000 | ORAL_TABLET | Freq: Every day | ORAL | Status: DC
  Administered 2020-07-09 – 2020-07-12 (×4): 1 via ORAL
  Filled 2020-07-08 (×4): qty 1

## 2020-07-08 MED ORDER — THIAMINE HCL 100 MG/ML IJ SOLN
100.0000 mg | Freq: Every day | INTRAMUSCULAR | Status: DC
  Administered 2020-07-08: 100 mg via INTRAVENOUS
  Filled 2020-07-08 (×2): qty 2

## 2020-07-08 MED ORDER — KETOROLAC TROMETHAMINE 15 MG/ML IJ SOLN
15.0000 mg | Freq: Three times a day (TID) | INTRAMUSCULAR | Status: DC | PRN
  Administered 2020-07-08 – 2020-07-10 (×4): 15 mg via INTRAVENOUS
  Filled 2020-07-08 (×4): qty 1

## 2020-07-08 MED ORDER — LABETALOL HCL 5 MG/ML IV SOLN
10.0000 mg | Freq: Four times a day (QID) | INTRAVENOUS | Status: DC | PRN
  Administered 2020-07-08: 10 mg via INTRAVENOUS
  Filled 2020-07-08: qty 4

## 2020-07-08 MED ORDER — FOLIC ACID 1 MG PO TABS: 1.0000 mg | ORAL_TABLET | Freq: Once | ORAL | Status: DC

## 2020-07-08 MED ORDER — THIAMINE HCL 100 MG/ML IJ SOLN
INTRAVENOUS | Status: AC
  Filled 2020-07-08 (×4): qty 1000

## 2020-07-08 MED ORDER — TRAZODONE HCL 50 MG PO TABS
50.0000 mg | ORAL_TABLET | Freq: Every evening | ORAL | Status: DC | PRN
  Administered 2020-07-08 – 2020-07-12 (×3): 50 mg via ORAL
  Filled 2020-07-08 (×4): qty 1

## 2020-07-08 MED ORDER — DIAZEPAM 5 MG PO TABS: 10.0000 mg | ORAL_TABLET | Freq: Three times a day (TID) | ORAL | Status: DC

## 2020-07-08 MED ORDER — SUCRALFATE 1 GM/10ML PO SUSP
1.0000 g | Freq: Four times a day (QID) | ORAL | Status: DC
  Administered 2020-07-08 – 2020-07-12 (×14): 1 g via ORAL
  Filled 2020-07-08 (×14): qty 10

## 2020-07-08 MED ORDER — LIDOCAINE VISCOUS HCL 2 % MT SOLN: 15.0000 mL | Freq: Once | OROMUCOSAL | Status: DC

## 2020-07-08 MED ORDER — PANTOPRAZOLE SODIUM 40 MG PO TBEC
40.0000 mg | DELAYED_RELEASE_TABLET | Freq: Every day | ORAL | Status: DC
  Administered 2020-07-08 – 2020-07-09 (×2): 40 mg via ORAL
  Filled 2020-07-08 (×2): qty 1

## 2020-07-08 MED ORDER — IOHEXOL 300 MG/ML  SOLN
100.0000 mL | Freq: Once | INTRAMUSCULAR | Status: AC | PRN
  Administered 2020-07-08: 100 mL via INTRAVENOUS

## 2020-07-08 MED ORDER — AMLODIPINE BESYLATE 10 MG PO TABS
10.0000 mg | ORAL_TABLET | Freq: Every day | ORAL | Status: DC
  Administered 2020-07-08 – 2020-07-12 (×5): 10 mg via ORAL
  Filled 2020-07-08 (×4): qty 1
  Filled 2020-07-08: qty 2

## 2020-07-08 MED ORDER — SERTRALINE HCL 100 MG PO TABS
100.0000 mg | ORAL_TABLET | Freq: Every day | ORAL | Status: DC
  Administered 2020-07-08 – 2020-07-12 (×5): 100 mg via ORAL
  Filled 2020-07-08 (×5): qty 1

## 2020-07-08 MED ORDER — METOPROLOL SUCCINATE ER 100 MG PO TB24
100.0000 mg | ORAL_TABLET | Freq: Every day | ORAL | Status: DC
  Administered 2020-07-08 – 2020-07-12 (×5): 100 mg via ORAL
  Filled 2020-07-08: qty 1
  Filled 2020-07-08: qty 4
  Filled 2020-07-08 (×3): qty 1

## 2020-07-08 MED ORDER — DIAZEPAM 5 MG PO TABS: 5.0000 mg | ORAL_TABLET | Freq: Three times a day (TID) | ORAL | Status: DC | PRN

## 2020-07-08 MED ORDER — PANTOPRAZOLE SODIUM 40 MG IV SOLR: 40.0000 mg | Freq: Once | INTRAVENOUS | Status: DC

## 2020-07-08 MED ORDER — LORAZEPAM 2 MG/ML IJ SOLN
0.0000 mg | Freq: Four times a day (QID) | INTRAMUSCULAR | Status: AC
  Administered 2020-07-08: 2 mg via INTRAVENOUS
  Administered 2020-07-08: 1 mg via INTRAVENOUS
  Administered 2020-07-08: 4 mg via INTRAVENOUS
  Administered 2020-07-09: 2 mg via INTRAVENOUS
  Filled 2020-07-08 (×2): qty 1
  Filled 2020-07-08: qty 2
  Filled 2020-07-08 (×2): qty 1

## 2020-07-08 MED ORDER — LORAZEPAM 2 MG/ML IJ SOLN
0.0000 mg | Freq: Two times a day (BID) | INTRAMUSCULAR | Status: DC
  Filled 2020-07-08: qty 2
  Filled 2020-07-08: qty 1

## 2020-07-08 MED ORDER — ALUM & MAG HYDROXIDE-SIMETH 200-200-20 MG/5ML PO SUSP: 30.0000 mL | Freq: Once | ORAL | Status: DC

## 2020-07-08 MED ORDER — LORAZEPAM 1 MG PO TABS: 0.0000 mg | ORAL_TABLET | Freq: Two times a day (BID) | ORAL | Status: DC

## 2020-07-08 MED ORDER — DIAZEPAM 5 MG PO TABS
10.0000 mg | ORAL_TABLET | Freq: Three times a day (TID) | ORAL | Status: AC
  Administered 2020-07-08 – 2020-07-09 (×3): 10 mg via ORAL
  Filled 2020-07-08 (×3): qty 2

## 2020-07-08 MED ORDER — HYOSCYAMINE SULFATE 0.125 MG SL SUBL
0.1250 mg | SUBLINGUAL_TABLET | Freq: Four times a day (QID) | SUBLINGUAL | Status: DC | PRN
  Administered 2020-07-08: 0.125 mg via SUBLINGUAL
  Filled 2020-07-08 (×2): qty 1

## 2020-07-08 MED ORDER — POTASSIUM CHLORIDE 10 MEQ/100ML IV SOLN
10.0000 meq | INTRAVENOUS | Status: AC
  Administered 2020-07-08 (×2): 10 meq via INTRAVENOUS
  Filled 2020-07-08 (×3): qty 100

## 2020-07-08 MED ORDER — SODIUM CHLORIDE 0.9 % IV SOLN
12.5000 mg | Freq: Four times a day (QID) | INTRAVENOUS | Status: DC | PRN
  Administered 2020-07-08 – 2020-07-12 (×5): 12.5 mg via INTRAVENOUS
  Filled 2020-07-08 (×5): qty 0.5

## 2020-07-08 MED ORDER — THIAMINE HCL 100 MG PO TABS
100.0000 mg | ORAL_TABLET | Freq: Every day | ORAL | Status: DC
  Administered 2020-07-09 – 2020-07-12 (×4): 100 mg via ORAL
  Filled 2020-07-08 (×4): qty 1

## 2020-07-08 MED ORDER — HYOSCYAMINE SULFATE 0.125 MG PO TABS: 0.1250 mg | ORAL_TABLET | Freq: Four times a day (QID) | ORAL | Status: DC | PRN

## 2020-07-08 MED ORDER — IBUPROFEN 800 MG PO TABS
800.0000 mg | ORAL_TABLET | Freq: Once | ORAL | Status: AC
  Administered 2020-07-08: 800 mg via ORAL
  Filled 2020-07-08: qty 1

## 2020-07-08 MED ORDER — DIAZEPAM 5 MG PO TABS
5.0000 mg | ORAL_TABLET | Freq: Three times a day (TID) | ORAL | Status: AC
  Administered 2020-07-09 – 2020-07-11 (×6): 5 mg via ORAL
  Filled 2020-07-08 (×6): qty 1

## 2020-07-08 MED ORDER — ENOXAPARIN SODIUM 40 MG/0.4ML IJ SOSY
40.0000 mg | PREFILLED_SYRINGE | INTRAMUSCULAR | Status: DC
  Administered 2020-07-08 – 2020-07-11 (×4): 40 mg via SUBCUTANEOUS
  Filled 2020-07-08 (×4): qty 0.4

## 2020-07-08 MED ORDER — POTASSIUM CHLORIDE 10 MEQ/100ML IV SOLN
10.0000 meq | INTRAVENOUS | Status: AC
  Administered 2020-07-08 (×2): 10 meq via INTRAVENOUS
  Filled 2020-07-08: qty 100

## 2020-07-08 MED ORDER — LORAZEPAM 1 MG PO TABS
0.0000 mg | ORAL_TABLET | Freq: Four times a day (QID) | ORAL | Status: AC
  Administered 2020-07-09: 2 mg via ORAL
  Administered 2020-07-09: 1 mg via ORAL
  Filled 2020-07-08 (×2): qty 2

## 2020-07-08 MED ORDER — SODIUM CHLORIDE 0.9 % IV BOLUS
2000.0000 mL | Freq: Once | INTRAVENOUS | Status: AC
  Administered 2020-07-08: 2000 mL via INTRAVENOUS

## 2020-07-08 NOTE — ED Triage Notes (Signed)
Patient coming from home. Complaint of alcohol withdrawal starting this morning. Patient states her last drink last night. Endorses drinking 1/5th per day.

## 2020-07-08 NOTE — ED Notes (Signed)
Attempted to give reportx1 

## 2020-07-08 NOTE — ED Provider Notes (Signed)
[facility] EMERGENCY DEPARTMENT Provider Note   CSN: [id number] Arrival date & time: 07/08/20  1101     History Chief Complaint  Patient presents with   Alcohol Intoxication    Nicole Hughes is a 50 y.o. female.  The history is provided by the patient and medical records. No language interpreter was used.  Alcohol Intoxication   50 year old female significant history of anxiety, depression, alcohol abuse, who presents with concerns of alcohol withdrawal.  Patient states she was clean from alcohol for approximately 2 months however she relapsed approximately 2 weeks ago.  States that she has been drinking fairly heavily usually of fifth of liquor per day and last use was last night.  She report waking up today feeling very shaky, with pain in her chest, having loose stools, and felt agitated and similar to prior alcohol withdrawal.  She denies other substance use and denies any SI or HI.  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   GERD (gastroesophageal reflux disease)    Hay fever    "fall and spring" (02/17/2017)   Headache    "2-3 times/.wik" (02/17/2017)   History of chicken pox    Hypertension    Migraine    "monthly" (02/17/2017)   Pelvic pain    Pulmonary embolism (HCC) 08/1998   S/P childbirth    Patient Active Problem List   Diagnosis Date Noted   Prolonged QT interval 05/19/2020   Acute lower UTI 04/16/2020   AKI (acute kidney injury) (HCC) 12/24/2019   Hypomagnesemia 12/24/2019   Dysphagia 05/31/2019   Insect bite 06/21/2018   Pedal edema 03/31/2018   Anemia 03/31/2018   Appetite loss 03/08/2018    Myofascial pain 03/08/2018   IBS (irritable bowel syndrome) 03/08/2018   Alcoholic hepatitis without ascites 03/05/2018   Elevated LFTs 03/03/2018   Achilles tendonitis 01/28/2018   Abnormal cervical Papanicolaou smear 09/29/2017   Insomnia 09/29/2017   Pain 09/29/2017   Palpitations 05/25/2017   Chest wall pain 05/25/2017   Elevated transaminase level 05/25/2017   Depression with anxiety 02/23/2017   MRSA carrier 02/23/2017   Hyponatremia 02/17/2017   Hypokalemia 09/24/2016   Right flank pain 09/22/2016   Alcohol abuse 09/22/2016   H/O acute pancreatitis 09/22/2016   GERD (gastroesophageal reflux disease) 04/09/2015   Epigastric pain 01/17/2014   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     Family  History  Problem Relation Age of Onset   Cancer - Lung Mother    Heart failure Father    Hypertension Father     Social History   Tobacco Use   Smoking status: Never   Smokeless tobacco: Never  Vaping Use   Vaping Use: Never used  Substance Use Topics   Alcohol use: Yes    Alcohol/week: 5.0 standard drinks    Types: 5 Cans of beer per week    Comment: sometimes 5 a day   Drug use: Yes    Frequency: 1.0 times per week    Types: Marijuana    Comment: Gives you chest pain - trying to quit    Home Medications Prior to Admission medications   Medication Sig Start Date End Date  Taking? Authorizing Provider  amLODipine (NORVASC) 10 MG tablet Take 1 tablet by mouth once daily Patient taking differently: No sig reported 06/07/19   Tower, Audrie Gallus, MD  dicyclomine (BENTYL) 20 MG tablet Take 1 tablet (20 mg total) by mouth 2 (two) times daily. 02/25/20   Sabas Sous, MD  lisinopril (ZESTRIL) 10 MG tablet Take 2 tablets (20 mg total) by mouth daily. Patient taking differently: No sig reported 05/16/20   Tower, Audrie Gallus, MD  metoprolol succinate (TOPROL-XL) 100 MG 24 hr tablet Take 1 tablet (100 mg total) by mouth daily. Take with or immediately following a meal. 05/16/20 08/14/20  Tower, Audrie Gallus, MD  Multiple Vitamin (MULTIVITAMIN WITH MINERALS) TABS tablet Take 1 tablet by mouth daily. 12/26/19   Alwyn Ren, MD  omeprazole (PRILOSEC) 20 MG capsule Take 1 capsule by mouth once daily 03/01/20   Tower, Audrie Gallus, MD  ondansetron (ZOFRAN) 4 MG tablet Take 1 tablet (4 mg total) by mouth every 6 (six) hours as needed for nausea. 12/26/19   Alwyn Ren, MD  sertraline (ZOLOFT) 50 MG tablet TAKE 1 TABLET BY MOUTH ONCE DAILY FOR 2 WEEKS THEN INCREASE TO 1&1/2 TABLETS FOR 2 WEEKS THEN 2 TABS DAILY Patient taking differently: No sig reported 05/15/20   Tower, Audrie Gallus, MD  sucralfate (CARAFATE) 1 GM/10ML suspension Take 10 mLs (1 g total) by mouth 4 (four) times daily. Patient taking differently: No sig reported 06/21/19   Armbruster, Willaim Rayas, MD    Allergies    Patient has no known allergies.  Review of Systems   Review of Systems  All other systems reviewed and are negative.  Physical Exam Updated Vital Signs BP (!) 165/99 (BP Location: Left Arm)   Pulse (!) 143   Temp 98 F (36.7 C) (Temporal)   Resp (!) 21   SpO2 100%   Physical Exam Vitals and nursing note reviewed.  Constitutional:      Appearance: She is well-developed.     Comments: Is tremulous, appears uncomfortable and agitated  HENT:     Head: Atraumatic.     Mouth/Throat:     Mouth: Mucous  membranes are moist.  Eyes:     Extraocular Movements: Extraocular movements intact.     Conjunctiva/sclera: Conjunctivae normal.     Pupils: Pupils are equal, round, and reactive to light.  Cardiovascular:     Rate and Rhythm: Tachycardia present.  Pulmonary:     Comments: Shallow breathing but lungs are clear Abdominal:     Palpations: Abdomen is soft.     Tenderness: There is abdominal tenderness (Mild diffuse tenderness).  Musculoskeletal:     Cervical back: Neck supple.     Right  lower leg: No edema.     Left lower leg: No edema.  Skin:    Findings: No rash.  Neurological:     Mental Status: She is alert and oriented to person, place, and time.  Psychiatric:        Mood and Affect: Affect is tearful.        Speech: Speech is rapid and pressured.        Behavior: Behavior is agitated.        Thought Content: Thought content does not include homicidal or suicidal ideation.    ED Results / Procedures / Treatments   Labs (all labs ordered are listed, but only abnormal results are displayed) Labs Reviewed  COMPREHENSIVE METABOLIC PANEL - Abnormal; Notable for the following components:      Result Value   Potassium 3.3 (*)    Chloride 95 (*)    Glucose, Bld 125 (*)    Creatinine, Ser 1.35 (*)    Total Protein 8.5 (*)    Albumin 5.1 (*)    AST 193 (*)    ALT 68 (*)    Total Bilirubin 1.7 (*)    GFR, Estimated 48 (*)    Anion gap 24 (*)    All other components within normal limits  CBC WITH DIFFERENTIAL/PLATELET - Abnormal; Notable for the following components:   RBC 3.81 (*)    Hemoglobin 9.2 (*)    HCT 31.1 (*)    MCH 24.1 (*)    MCHC 29.6 (*)    RDW 22.3 (*)    Lymphs Abs 0.3 (*)    All other components within normal limits  LIPASE, BLOOD - Abnormal; Notable for the following components:   Lipase 74 (*)    All other components within normal limits  SARS CORONAVIRUS 2 (TAT 6-24 HRS)  ETHANOL  RAPID URINE DRUG SCREEN, HOSP PERFORMED  I-STAT BETA HCG BLOOD, ED  ([facility], [facility], [facility] ONLY)    EKG None ED ECG REPORT   Date: 07/08/2020  Rate: 134  Rhythm: sinus tachycardia  QRS Axis: left  Intervals: normal  ST/T Wave abnormalities: nonspecific ST changes  Conduction Disutrbances:none  Narrative Interpretation:   Old EKG Reviewed: unchanged  I have personally reviewed the EKG tracing and agree with the computerized printout as noted.   Radiology CT ABDOMEN PELVIS W CONTRAST  Result Date: 07/08/2020 CLINICAL DATA:  Abdominal pain with elevated lipase and transaminitis. EXAM: CT ABDOMEN AND PELVIS WITH CONTRAST TECHNIQUE: Multidetector CT imaging of the abdomen and pelvis was performed using the standard protocol following bolus administration of intravenous contrast. CONTRAST:  OMNIPAQUE IOHEXOL 300 MG/ML  SOLN COMPARISON:  02/25/2020 FINDINGS: Lower chest: Lung bases are clear. Mild wall thickening in the distal esophagus. Evidence for coronary artery calcifications. Hepatobiliary: Liver is markedly low density. Findings are compatible with steatosis. No acute abnormality to the gallbladder. No significant biliary dilatation. Portal venous system is patent. Pancreas: Unremarkable. No pancreatic ductal dilatation or surrounding inflammatory changes. Spleen: Normal in size without focal abnormality. Adrenals/Urinary Tract: Normal adrenal glands. Tiny hypodensity in the medial right kidney is too small to definitively characterize. No suspicious renal lesions. Normal appearance of the urinary bladder. Stomach/Bowel: Mild wall thickening in the distal esophagus. Normal appendix. Normal appearance of the stomach. Few dilated loops of jejunum in the left upper quadrant on series 3, image 39. No convincing evidence for obstructive process. Vascular/Lymphatic: Portal venous system is patent. Venous structures are unremarkable. Main visceral arteries are patent. Atherosclerotic disease in the  abdominal aorta without aneurysm, dissection or significant stenosis. No  abdominal or pelvic lymphadenopathy. Reproductive: Uterus and left adnexa are unremarkable. Right ovary/adnexa is slightly enlarged measuring to 5.0 cm and suspect there is a dominant cystic structure in this area measuring roughly 3.4 cm that could represent an ovarian cyst. Other: Negative for ascites. Negative for free air. No significant abdominal hernia. Musculoskeletal: Mild anterolisthesis of L4 on L5. No acute bone abnormality. IMPRESSION: 1. Mild wall thickening in the distal esophagus. Findings are nonspecific but can be associated esophagitis. Mild dilatation of the proximal jejunum is nonspecific. Overall, no evidence for a bowel obstructive process. 2. Normal appearance of the pancreas without inflammatory changes. 3. Hepatic steatosis. 4. Interval enlargement of the right ovary/right adnexa with a 3.4 cm low-density structure. In a patient of this age, suspect this is related to prominent follicles or an ovarian cyst. No inflammatory changes around the right adnexa. Electronically Signed   By: Richarda OverlieAdam  Henn M.D.   On: 07/08/2020 15:13    Procedures .Critical Care  Date/Time: 07/08/2020 3:46 PM Performed by: Fayrene Helperran, Loyda Costin, PA-C Authorized by: Fayrene Helperran, Kennedey Digilio, PA-C   Critical care provider statement:    Critical care time (minutes):  32   Critical care was time spent personally by me on the following activities:  Discussions with consultants, evaluation of patient's response to treatment, examination of patient, ordering and performing treatments and interventions, ordering and review of laboratory studies, ordering and review of radiographic studies, pulse oximetry, re-evaluation of patient's condition, obtaining history from patient or surrogate and review of old charts   Medications Ordered in ED Medications  LORazepam (ATIVAN) injection 0-4 mg (has no administration in time range)    Or  LORazepam (ATIVAN) tablet 0-4 mg (has no administration in time range)  LORazepam (ATIVAN) injection 0-4 mg  (has no administration in time range)    Or  LORazepam (ATIVAN) tablet 0-4 mg (has no administration in time range)  thiamine tablet 100 mg (has no administration in time range)    Or  thiamine (B-1) injection 100 mg (has no administration in time range)  sodium chloride 0.9 % bolus 2,000 mL (has no administration in time range)    ED Course  I have reviewed the triage vital signs and the nursing notes.  Pertinent labs & imaging results that were available during my care of the patient were reviewed by me and considered in my medical decision making (see chart for details).    MDM Rules/Calculators/A&P                          BP (!) 155/95   Pulse (!) 105   Temp 98 F (36.7 C) (Temporal)   Resp (!) 21   SpO2 98%   Final Clinical Impression(s) / ED Diagnoses Final diagnoses:  Alcoholic intoxication with complication (HCC)  AKI (acute kidney injury) (HCC)    Rx / DC Orders ED Discharge Orders     None      12:11 PM Patient here with agitation, nausea, diarrhea, shakiness, which she attributes to alcohol withdrawals.  She does admits to drinking alcohol heavily last use was yesterday.  She is tachycardic heart rate in the 140s.  EKG shows sinus tach.  Evidence of prolonged QT.  Will initiate treatment and place patient on CIWA protocol.  Care discussed with Dr. Rhunette CroftNanavati.   CIWA score 28, will stay aggressive with management of her withdrawal.  2:05 PM With the administration of  benzodiazepine and IV fluid, symptoms seems to be improving however patient continues to endorse nausea.  EKG did not show evidence of prolonged QT.  We will continue with symptomatic treatment.  Labs concerning for AKI with creatinine 1.35 worse compared to baseline.  Patient also have elevated lipase and transaminitis which is not far off from her baseline however due to history of alcohol abuse, will obtain abdominal pelvic CT scan to assess for potential pancreatitis.   3:47 PM CT scan that  demonstrates some findings suggestive of esophagitis likely secondary to alcohol use.  No evidence of pancreatitis.  Appreciate consultation from [facility] hospitalist, Dr. Chipper Herb who agrees to see and will admit patient for further care.  Patient is at risk for DVT therefore she will need to be monitored very closely.   Fayrene Helper, PA-C 07/08/20 1548    Derwood Kaplan, MD 07/10/20 [phone]

## 2020-07-08 NOTE — H&P (Signed)
History and Physical    HIBO BLASDELL NAT:[id number] DOB: [birth year] DOA: 07/08/2020  PCP: Judy Pimple, MD (Confirm with patient/family/NH records and if not entered, this has to be entered at [facility] point of entry) Patient coming from: Home  I have personally briefly reviewed patient's old medical records in [facility]  Chief Complaint: Feeling sick  HPI: Nicole Hughes is a 50 y.o. female with medical history significant of alcohol abuse, anxiety/depression, chronic back pain, IBS, HTN, remote history of provoked DVT and PE during pregnancy off anticoagulation, presented with alcohol withdrawal symptoms.  Patient claims that she was successfully detoxed in early May and stayed clean of alcohol for 1 month and a half.  But last week, started to have cramping-like abdominal pain and watery diarrhea 2-3 times a day, which she reports flareup of IBS.  Last night, she started to drink alcohol again 1/5 bottle of liquor.  Overnight she started to feel nauseous and tremors on her fingertips and feeling chills and the feeling about withdrawal from alcohol.  ED Course: Patient was found to be in acute alcohol withdrawal, with symptoms of tachycardia, tremors on fingertips and sweaty with a CIWA score> 20.  Lab work showed chronic elevation of LFTs.  Patient was started on CIWA protocol with Zofran and symptoms improved.  Review of Systems: As per HPI otherwise 14 point review of systems negative.    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   GERD (gastroesophageal reflux disease)    Hay fever    "fall and spring" (02/17/2017)   Headache    "2-3 times/.wik" (02/17/2017)   History of chicken pox     Hypertension    Migraine    "monthly" (02/17/2017)   Pelvic pain    Pulmonary embolism (HCC) 08/1998   S/P childbirth    Past Surgical History:  Procedure Laterality Date   CESAREAN SECTION  1994   ECTOPIC PREGNANCY SURGERY  2010   LAPAROSCOPY N/A 09/24/2012   Procedure: OPERATIVE LAPROSCOPY WITH LYSIS OF ADHESIONS;  Surgeon: Sherron Monday, MD;  Location: [facility];  Service: Gynecology;  Laterality: N/A;   TONSILLECTOMY  1984   TUBAL LIGATION  2010     reports that she has never smoked. She has never used smokeless tobacco. She reports current alcohol use of about 5.0 standard drinks of alcohol per week. She reports current drug use. Frequency: 1.00 time per week. Drug: Marijuana.  No Known Allergies  Family History  Problem Relation Age of Onset   Cancer - Lung Mother    Heart failure Father    Hypertension Father      Prior to Admission medications   Medication Sig Start Date End Date Taking? Authorizing Provider  amLODipine (NORVASC) 10 MG tablet Take 1 tablet by mouth once daily Patient taking differently: Take 10 mg by mouth daily. 06/07/19  Yes Tower, Audrie Gallus, MD  dicyclomine (BENTYL) 20 MG tablet Take 1 tablet (20 mg total) by mouth 2 (two) times daily. 02/25/20  Yes Sabas Sous, MD  lisinopril (ZESTRIL) 10 MG tablet Take 2 tablets (20 mg total) by mouth  daily. Patient taking differently: Take 20 mg by mouth daily. 05/16/20  Yes Tower, Audrie Gallus, MD  metoprolol succinate (TOPROL-XL) 100 MG 24 hr tablet Take 1 tablet (100 mg total) by mouth daily. Take with or immediately following a meal. 05/16/20 08/14/20 Yes Tower, Audrie Gallus, MD  Multiple Vitamin (MULTIVITAMIN WITH MINERALS) TABS tablet Take 1 tablet by mouth daily. 12/26/19  Yes Alwyn Ren, MD  omeprazole (PRILOSEC) 20 MG capsule Take 1 capsule by mouth once daily 03/01/20  Yes Tower, Idamae Schuller A, MD  sertraline (ZOLOFT) 50 MG tablet TAKE 1 TABLET BY MOUTH ONCE DAILY FOR 2 WEEKS THEN INCREASE TO 1&1/2 TABLETS FOR 2 WEEKS  THEN 2 TABS DAILY Patient taking differently: Take 100 mg by mouth daily. 05/15/20  Yes Tower, Audrie Gallus, MD  sucralfate (CARAFATE) 1 GM/10ML suspension Take 10 mLs (1 g total) by mouth 4 (four) times daily. Patient taking differently: Take 1 g by mouth 4 (four) times daily as needed (For constipation). 06/21/19  Yes Armbruster, Willaim Rayas, MD  ondansetron (ZOFRAN) 4 MG tablet Take 1 tablet (4 mg total) by mouth every 6 (six) hours as needed for nausea. 12/26/19   Alwyn Ren, MD    Physical Exam: Vitals:   07/08/20 1315 07/08/20 1330 07/08/20 1345 07/08/20 1415  BP: (!) 161/87 (!) 157/89 (!) 158/94 (!) 163/97  Pulse: (!) 109 (!) 104 (!) 110 (!) 114  Resp: 20 (!) 21 (!) 22 (!) 21  Temp:      TempSrc:      SpO2: 99% 100% 99% 98%    Constitutional: NAD, calm, comfortable Vitals:   07/08/20 1315 07/08/20 1330 07/08/20 1345 07/08/20 1415  BP: (!) 161/87 (!) 157/89 (!) 158/94 (!) 163/97  Pulse: (!) 109 (!) 104 (!) 110 (!) 114  Resp: 20 (!) 21 (!) 22 (!) 21  Temp:      TempSrc:      SpO2: 99% 100% 99% 98%   Eyes: PERRL, lids and conjunctivae normal ENMT: Mucous membranes are moist. Posterior pharynx clear of any exudate or lesions.Normal dentition.  Neck: normal, supple, no masses, no thyromegaly Respiratory: clear to auscultation bilaterally, no wheezing, no crackles. Normal respiratory effort. No accessory muscle use.  Cardiovascular: Regular rate and rhythm, no murmurs / rubs / gallops. No extremity edema. 2+ pedal pulses. No carotid bruits.  Abdomen: no tenderness, no masses palpated. No hepatosplenomegaly. Bowel sounds positive.  Musculoskeletal: no clubbing / cyanosis. No joint deformity upper and lower extremities. Good ROM, no contractures. Normal muscle tone.  Skin: no rashes, lesions, ulcers. No induration Neurologic: CN 2-12 grossly intact. Sensation intact, DTR normal. Strength 5/5 in all 4.  Appears to be no worse, tachycardia and tremors on fingertips Psychiatric:  Normal judgment and insight. Alert and oriented x 3. Normal mood.     Labs on Admission: I have personally reviewed following labs and imaging studies  CBC: Recent Labs  Lab 07/08/20 1205  WBC 6.2  NEUTROABS 4.9  HGB 9.2*  HCT 31.1*  MCV 81.6  PLT 187   Basic Metabolic Panel: Recent Labs  Lab 07/08/20 1205  NA 141  K 3.3*  CL 95*  CO2 22  GLUCOSE 125*  BUN 11  CREATININE 1.35*  CALCIUM 9.8   GFR: CrCl cannot be calculated (Unknown ideal weight.). Liver Function Tests: Recent Labs  Lab 07/08/20 1205  AST 193*  ALT 68*  ALKPHOS 45  BILITOT 1.7*  PROT 8.5*  ALBUMIN 5.1*   Recent Labs  Lab 07/08/20 1205  LIPASE 74*   No results for input(s): AMMONIA in the last 168 hours. Coagulation Profile: No results for input(s): INR, PROTIME in the last 168 hours. Cardiac Enzymes: No results for input(s): CKTOTAL, CKMB, CKMBINDEX, TROPONINI in the last 168 hours. BNP (last 3 results) No results for input(s): PROBNP in the last 8760 hours. HbA1C: No results for input(s): HGBA1C in the last 72 hours. CBG: No results for input(s): GLUCAP in the last 168 hours. Lipid Profile: No results for input(s): CHOL, HDL, LDLCALC, TRIG, CHOLHDL, LDLDIRECT in the last 72 hours. Thyroid Function Tests: No results for input(s): TSH, T4TOTAL, FREET4, T3FREE, THYROIDAB in the last 72 hours. Anemia Panel: No results for input(s): VITAMINB12, FOLATE, FERRITIN, TIBC, IRON, RETICCTPCT in the last 72 hours. Urine analysis:    Component Value Date/Time   COLORURINE YELLOW 05/19/2020 1229   APPEARANCEUR CLOUDY (A) 05/19/2020 1229   LABSPEC 1.017 05/19/2020 1229   PHURINE 5.0 05/19/2020 1229   GLUCOSEU 50 (A) 05/19/2020 1229   HGBUR MODERATE (A) 05/19/2020 1229   BILIRUBINUR NEGATIVE 05/19/2020 1229   BILIRUBINUR trace 12/20/2012 1553   KETONESUR NEGATIVE 05/19/2020 1229   PROTEINUR 100 (A) 05/19/2020 1229   UROBILINOGEN 0.2 06/22/2014 1147   NITRITE NEGATIVE 05/19/2020 1229    LEUKOCYTESUR NEGATIVE 05/19/2020 1229    Radiological Exams on Admission: No results found.  EKG: Independently reviewed. Sinus tachycardia  Assessment/Plan Active Problems:   Alcohol withdrawal syndrome (HCC)   Alcohol withdrawal (HCC)  (please populate well all problems here in Problem List. (For example, if patient is on BP meds at home and you resume or decide to hold them, it is a problem that needs to be her. Same for CAD, COPD, HLD and so on)  Alcohol withdrawal, acute, severe -Seems improving with IV Ativan -Admit to tele for close monitoring -Continue with CIWA protocol with banana bag x1 day  IBS flareup with cramping-like abdominal pain and diarrhea -Trial of Levsin.  Continue as needed -CT abd pending, if no significant finding, expect patient may follow-up with GI as outpatient.  Elevated LFTs -Seems to be chronic, no RUQ abdominal pain. -CT abdomen pending. -Likely related to alcohol abuse, suspect alcoholic hepatitis, send INR.  AKI -Secondary to diarrhea and poor p.o. intake, on IV fluids, hold ACEI.  Elevated lipase -Seems to be chronic -Trend level of lipase in AM, better care with pain control and hydration.  HTN -Uncontrolled, resume home BP meds, add as needed hydralazine.  History of esophagitis and gastritis -Continue PPI and Carafate  Chronic anemia -H&H level stable, check iron study.  DVT prophylaxis: Lovenox Code Status: Full code Family Communication: None at bedside Disposition Plan: Expect to state hospital stay to treat severe alcohol withdrawal Consults called: None Admission status: Telemetry admission   Emeline General MD [facility] Pager [phone]  07/08/2020, 3:09 PM

## 2020-07-09 DIAGNOSIS — F10239 Alcohol dependence with withdrawal, unspecified: Principal | ICD-10-CM

## 2020-07-09 LAB — MAGNESIUM: Magnesium: 1 mg/dL — ABNORMAL LOW (ref 1.7–2.4)

## 2020-07-09 LAB — SARS CORONAVIRUS 2 (TAT 6-24 HRS): SARS Coronavirus 2: NEGATIVE

## 2020-07-09 LAB — COMPREHENSIVE METABOLIC PANEL
ALT: 42 U/L (ref 0–44)
AST: 109 U/L — ABNORMAL HIGH (ref 15–41)
Albumin: 3.5 g/dL (ref 3.5–5.0)
Alkaline Phosphatase: 32 U/L — ABNORMAL LOW (ref 38–126)
Anion gap: 8 (ref 5–15)
BUN: 7 mg/dL (ref 6–20)
CO2: 28 mmol/L (ref 22–32)
Calcium: 7.8 mg/dL — ABNORMAL LOW (ref 8.9–10.3)
Chloride: 103 mmol/L (ref 98–111)
Creatinine, Ser: 0.81 mg/dL (ref 0.44–1.00)
GFR, Estimated: >60 mL/min (ref >60)
Glucose, Bld: 79 mg/dL (ref 70–99)
Potassium: 3.4 mmol/L — ABNORMAL LOW (ref 3.5–5.1)
Sodium: 139 mmol/L (ref 135–145)
Total Bilirubin: 1 mg/dL (ref 0.3–1.2)
Total Protein: 5.9 g/dL — ABNORMAL LOW (ref 6.5–8.1)

## 2020-07-09 LAB — LIPASE, BLOOD: Lipase: 51 U/L (ref 11–51)

## 2020-07-09 MED ORDER — PANTOPRAZOLE SODIUM 40 MG PO PACK
40.0000 mg | PACK | Freq: Two times a day (BID) | ORAL | Status: DC
  Administered 2020-07-09 – 2020-07-12 (×7): 40 mg via ORAL
  Filled 2020-07-09 (×7): qty 20

## 2020-07-09 MED ORDER — POTASSIUM CHLORIDE CRYS ER 20 MEQ PO TBCR
40.0000 meq | EXTENDED_RELEASE_TABLET | Freq: Once | ORAL | Status: AC
  Administered 2020-07-09: 40 meq via ORAL
  Filled 2020-07-09: qty 2

## 2020-07-09 MED ORDER — PANTOPRAZOLE SODIUM 40 MG PO TBEC: 40.0000 mg | DELAYED_RELEASE_TABLET | Freq: Two times a day (BID) | ORAL | Status: DC

## 2020-07-09 MED ORDER — MAGNESIUM SULFATE 4 GM/100ML IV SOLN
4.0000 g | Freq: Once | INTRAVENOUS | Status: AC
  Administered 2020-07-09: 4 g via INTRAVENOUS
  Filled 2020-07-09: qty 100

## 2020-07-09 NOTE — Plan of Care (Signed)
?  Problem: Clinical Measurements: ?Goal: Diagnostic test results will improve ?Outcome: Progressing ?  ?Problem: Nutrition: ?Goal: Adequate nutrition will be maintained ?Outcome: Progressing ?  ?Problem: Coping: ?Goal: Level of anxiety will decrease ?Outcome: Progressing ?  ?Problem: Safety: ?Goal: Ability to remain free from injury will improve ?Outcome: Progressing ?  ?

## 2020-07-09 NOTE — Progress Notes (Signed)
   07/09/20 0549  Provider Notification  Provider Name/Title Dr. Margo Aye  Date Provider Notified 07/09/20  Time Provider Notified [phone]  Notification Type Page  Notification Reason Other (Comment) (CIWA is now 68)  Provider response No new orders  Date of Provider Response 07/09/20  Time of Provider Response [phone]   Called and made Dr. Margo Aye aware that patient's current CIWA is 22.  No new orders received. Per MD, continue to monitor.  Bernie Covey RN

## 2020-07-09 NOTE — Progress Notes (Signed)
Progress Note    Nicole Hughes  ESP:[id number] DOB: 01/21/1970  DOA: 07/08/2020 PCP: Judy Pimple, MD    Brief Narrative:     Medical records reviewed and are as summarized below:  Nicole Hughes is an 50 y.o. female with medical history significant of alcohol abuse, anxiety/depression, chronic back pain, IBS, HTN, remote history of provoked DVT and PE during pregnancy off anticoagulation, presented with alcohol withdrawal symptoms.    Assessment/Plan:   Active Problems:   Alcohol withdrawal syndrome (HCC)   Alcohol withdrawal (HCC)   Alcohol withdrawal, acute, severe -CIWA -monitor closely   IBS flareup with cramping-like abdominal pain and diarrhea -Trial of Levsin.  Continue as needed -CT abd w/o concerning features   Elevated LFTs -Seems to be chronic, no RUQ abdominal pain. -Likely related to alcohol abuse, suspect alcoholic hepatitis   AKI -resolved     HTN -Uncontrolled, resume home BP meds   History of esophagitis and gastritis with c/o feeling like food getting stuck -Continue PPI and Carafate -once CIWA stable get DG esophagus      Family Communication/Anticipated D/C date and plan/Code Status   DVT prophylaxis: Lovenox ordered. Code Status: Full Code.  Disposition Plan: Status is: Inpatient  Remains inpatient appropriate because:Inpatient level of care appropriate due to severity of illness  Dispo: The patient is from: Home              Anticipated d/c is to: Home              Patient currently is not medically stable to d/c.   Difficult to place patient No         Medical Consultants:   None.    Subjective:   Feels like food and liquid get stuck in esophaus  Objective:    Vitals:   07/08/20 1945 07/08/20 2352 07/09/20 0532 07/09/20 1013  BP: (!) 134/115 (!) 149/89 (!) 148/83 (!) 157/90  Pulse: (!) 108 96 91 89  Resp:  19 19 16   Temp: 98.1 F (36.7 C) 98.6 F (37 C) 98.7 F (37.1 C) 98.3 F (36.8 C)   TempSrc: Oral Oral Oral Oral  SpO2: 98% 98% 100% 100%  Height:        Intake/Output Summary (Last 24 hours) at 07/09/2020 1142 Last data filed at 07/09/2020 1133 Gross per 24 hour  Intake 3022.42 ml  Output 1250 ml  Net 1772.42 ml   There were no vitals filed for this visit.  Exam:  General: Appearance:    female in mild distress     Lungs:     respirations unlabored  Heart:    Normal heart rate.   MS:   Hughes extremities are intact.    Neurologic:   Awake, alert     Data Reviewed:   I have personally reviewed following labs and imaging studies:  Labs: Labs show the following:   Basic Metabolic Panel: Recent Labs  Lab 07/08/20 1205 07/08/20 1643 07/09/20 0405  NA 141  --  139  K 3.3*  --  3.4*  CL 95*  --  103  CO2 22  --  28  GLUCOSE 125*  --  79  BUN 11  --  7  CREATININE 1.35* 1.02* 0.81  CALCIUM 9.8  --  7.8*   GFR CrCl cannot be calculated (Unknown ideal weight.). Liver Function Tests: Recent Labs  Lab 07/08/20 1205 07/09/20 0405  AST 193* 109*  ALT 68* 42  ALKPHOS 45 32*  BILITOT 1.7* 1.0  PROT 8.5* 5.9*  ALBUMIN 5.1* 3.5   Recent Labs  Lab 07/08/20 1205 07/09/20 0405  LIPASE 74* 51   No results for input(s): AMMONIA in the last 168 hours. Coagulation profile Recent Labs  Lab 07/08/20 1643  INR 1.1    CBC: Recent Labs  Lab 07/08/20 1205  WBC 6.2  NEUTROABS 4.9  HGB 9.2*  HCT 31.1*  MCV 81.6  PLT 187   Cardiac Enzymes: No results for input(s): CKTOTAL, CKMB, CKMBINDEX, TROPONINI in the last 168 hours. BNP (last 3 results) No results for input(s): PROBNP in the last 8760 hours. CBG: No results for input(s): GLUCAP in the last 168 hours. D-Dimer: No results for input(s): DDIMER in the last 72 hours. Hgb A1c: No results for input(s): HGBA1C in the last 72 hours. Lipid Profile: No results for input(s): CHOL, HDL, LDLCALC, TRIG, CHOLHDL, LDLDIRECT in the last 72 hours. Thyroid function studies: No results for input(s):  TSH, T4TOTAL, T3FREE, THYROIDAB in the last 72 hours.  Invalid input(s): FREET3 Anemia work up: No results for input(s): VITAMINB12, FOLATE, FERRITIN, TIBC, IRON, RETICCTPCT in the last 72 hours. Sepsis Labs: Recent Labs  Lab 07/08/20 1205  WBC 6.2    Microbiology No results found for this or any previous visit (from the past 240 hour(s)).  Procedures and diagnostic studies:  CT ABDOMEN PELVIS W CONTRAST  Result Date: 07/08/2020 CLINICAL DATA:  Abdominal pain with elevated lipase and transaminitis. EXAM: CT ABDOMEN AND PELVIS WITH CONTRAST TECHNIQUE: Multidetector CT imaging of the abdomen and pelvis was performed using the standard protocol following bolus administration of intravenous contrast. CONTRAST:  OMNIPAQUE IOHEXOL 300 MG/ML  SOLN COMPARISON:  02/25/2020 FINDINGS: Lower chest: Lung bases are clear. Mild wall thickening in the distal esophagus. Evidence for coronary artery calcifications. Hepatobiliary: Liver is markedly low density. Findings are compatible with steatosis. No acute abnormality to the gallbladder. No significant biliary dilatation. Portal venous system is patent. Pancreas: Unremarkable. No pancreatic ductal dilatation or surrounding inflammatory changes. Spleen: Normal in size without focal abnormality. Adrenals/Urinary Tract: Normal adrenal glands. Tiny hypodensity in the medial right kidney is too small to definitively characterize. No suspicious renal lesions. Normal appearance of the urinary bladder. Stomach/Bowel: Mild wall thickening in the distal esophagus. Normal appendix. Normal appearance of the stomach. Few dilated loops of jejunum in the left upper quadrant on series 3, image 39. No convincing evidence for obstructive process. Vascular/Lymphatic: Portal venous system is patent. Venous structures are unremarkable. Main visceral arteries are patent. Atherosclerotic disease in the abdominal aorta without aneurysm, dissection or significant stenosis. No  abdominal or pelvic lymphadenopathy. Reproductive: Uterus and left adnexa are unremarkable. Right ovary/adnexa is slightly enlarged measuring to 5.0 cm and suspect there is a dominant cystic structure in this area measuring roughly 3.4 cm that could represent an ovarian cyst. Other: Negative for ascites. Negative for free air. No significant abdominal hernia. Musculoskeletal: Mild anterolisthesis of L4 on L5. No acute bone abnormality. IMPRESSION: 1. Mild wall thickening in the distal esophagus. Findings are nonspecific but can be associated esophagitis. Mild dilatation of the proximal jejunum is nonspecific. Overall, no evidence for a bowel obstructive process. 2. Normal appearance of the pancreas without inflammatory changes. 3. Hepatic steatosis. 4. Interval enlargement of the right ovary/right adnexa with a 3.4 cm low-density structure. In a patient of this age, suspect this is related to prominent follicles or an ovarian cyst. No inflammatory changes around the right adnexa. Electronically Signed   By: Madelaine Bhat  Lowella Dandy M.D.   On: 07/08/2020 15:13    Medications:    alum & mag hydroxide-simeth  30 mL Oral Once   And   lidocaine  15 mL Oral Once   amLODipine  10 mg Oral Daily   diazepam  10 mg Oral TID   Followed by   diazepam  5 mg Oral TID   enoxaparin (LOVENOX) injection  40 mg Subcutaneous Q24H   folic acid  1 mg Oral Once   LORazepam  0-4 mg Intravenous Q6H   Or   LORazepam  0-4 mg Oral Q6H   [START ON 07/10/2020] LORazepam  0-4 mg Intravenous Q12H   Or   [START ON 07/10/2020] LORazepam  0-4 mg Oral Q12H   metoprolol succinate  100 mg Oral Daily   multivitamin with minerals  1 tablet Oral Daily   pantoprazole sodium  40 mg Oral BID   sertraline  100 mg Oral Daily   sucralfate  1 g Oral QID   thiamine  100 mg Oral Daily   Or   thiamine  100 mg Intravenous Daily   Continuous Infusions:  promethazine (PHENERGAN) injection (IM or IVPB) 12.5 mg (07/08/20 2151)   banana bag IV 1000 mL 150 mL/hr  at 07/09/20 1133     LOS: 1 day   Joseph Art  [facility]   How to contact the [facility] Attending or Consulting provider 7A - 7P or covering provider during after hours 7P -7A, for this patient?  Check the care team in [facility] and look for a) attending/consulting TRH provider listed and b) the [facility] team listed Log into [email or web address] and use [place]'s universal password to access. If you do not have the password, please contact the hospital operator. Locate the [facility] provider you are looking for under [facility] and page to a number that you can be directly reached. If you still have difficulty reaching the provider, please page the [facility] (Director on Call) for the Hospitalists listed on amion for assistance.  07/09/2020, 11:42 AM

## 2020-07-09 NOTE — Progress Notes (Signed)
   07/08/20 1955  Provider Notification  Provider Name/Title Dr. Margo Aye  Date Provider Notified 07/08/20  Time Provider Notified 1955  Notification Type Page  Notification Reason Change in status  Provider response See new orders  Date of Provider Response 07/08/20   Patient states her last drink was the night before at 10 pm.  Upon assessment, the patient's CIWA score is 26.  She is impulsive and forgetful.  Ativan is scheduled Q 6 hours with no PRNs.  She is complaining of severe headache and nausea.  Dr. Margo Aye called and made aware.  Order received for Valium PO, Toradol IV and Phenergan  Piggy back.  2nd PIV site obtained by IV Team.  Rapid Response Nurse made aware of the situation.  Orders implemented.  Will continue to monitor patient.  Bernie Covey RN

## 2020-07-10 ENCOUNTER — Inpatient Hospital Stay (HOSPITAL_COMMUNITY): Payer: 59

## 2020-07-10 DIAGNOSIS — N179 Acute kidney failure, unspecified: Secondary | ICD-10-CM

## 2020-07-10 DIAGNOSIS — K229 Disease of esophagus, unspecified: Secondary | ICD-10-CM

## 2020-07-10 LAB — CBC WITH DIFFERENTIAL/PLATELET
Abs Immature Granulocytes: 0 10*3/uL (ref 0.00–0.07)
Basophils Absolute: 0 10*3/uL (ref 0.0–0.1)
Basophils Relative: 1 %
Eosinophils Absolute: 0.1 10*3/uL (ref 0.0–0.5)
Eosinophils Relative: 3 %
HCT: 27.1 % — ABNORMAL LOW (ref 36.0–46.0)
Hemoglobin: 8.2 g/dL — ABNORMAL LOW (ref 12.0–15.0)
Immature Granulocytes: 0 %
Lymphocytes Relative: 40 %
Lymphs Abs: 1.3 10*3/uL (ref 0.7–4.0)
MCH: 24.9 pg — ABNORMAL LOW (ref 26.0–34.0)
MCHC: 30.3 g/dL (ref 30.0–36.0)
MCV: 82.4 fL (ref 80.0–100.0)
Monocytes Absolute: 0.4 10*3/uL (ref 0.1–1.0)
Monocytes Relative: 13 %
Neutro Abs: 1.4 10*3/uL — ABNORMAL LOW (ref 1.7–7.7)
Neutrophils Relative %: 43 %
Platelets: 127 10*3/uL — ABNORMAL LOW (ref 150–400)
RBC: 3.29 MIL/uL — ABNORMAL LOW (ref 3.87–5.11)
RDW: 21.1 % — ABNORMAL HIGH (ref 11.5–15.5)
WBC: 3.2 10*3/uL — ABNORMAL LOW (ref 4.0–10.5)
nRBC: 0 % (ref 0.0–0.2)

## 2020-07-10 MED ORDER — LORAZEPAM 1 MG PO TABS
0.0000 mg | ORAL_TABLET | Freq: Four times a day (QID) | ORAL | Status: AC
Start: 2020-07-10 — End: 2020-07-12
  Administered 2020-07-11: 1 mg via ORAL
  Filled 2020-07-10: qty 1

## 2020-07-10 MED ORDER — LORAZEPAM 2 MG/ML IJ SOLN
0.0000 mg | Freq: Four times a day (QID) | INTRAMUSCULAR | Status: AC
  Administered 2020-07-10 – 2020-07-11 (×5): 2 mg via INTRAVENOUS
  Filled 2020-07-10 (×5): qty 1

## 2020-07-10 NOTE — Progress Notes (Signed)
Progress Note    Nicole Hughes  GUY:[id number] DOB: 10-28-1970  DOA: 07/08/2020 PCP: Judy Pimple, MD    Brief Narrative:     Medical records reviewed and are as summarized below:  Nicole Hughes is an 50 y.o. female with medical history significant of alcohol abuse, anxiety/depression, chronic back pain, IBS, HTN, remote history of provoked DVT and PE during pregnancy off anticoagulation, presented with alcohol withdrawal symptoms.    Assessment/Plan:   Active Problems:   Alcohol withdrawal syndrome (HCC)   Alcohol withdrawal (HCC)   Alcohol withdrawal, acute, severe -CIWA -monitor closely   IBS flareup with cramping-like abdominal pain and diarrhea -Trial of Levsin.  Continue as needed -CT abd w/o concerning features   Elevated LFTs -Seems to be chronic, no RUQ abdominal pain. -Likely related to alcohol abuse, suspect alcoholic hepatitis   AKI -resolved   HTN -Uncontrolled, resume home BP meds   History of esophagitis and gastritis with c/o feeling like food getting stuck -Continue PPI and Carafate -get DG esophagus   Hypokalemia/hypomagnesemia -replete    Family Communication/Anticipated D/C date and plan/Code Status   DVT prophylaxis: Lovenox ordered. Code Status: Full Code.  Disposition Plan: Status is: Inpatient  Remains inpatient appropriate because:Inpatient level of care appropriate due to severity of illness  Dispo: The patient is from: Home              Anticipated d/c is to: Home              Patient currently is not medically stable to d/c.   Difficult to place patient No         Medical Consultants:   None.    Subjective:   Continues to c/o pain in esophagus with eating  Objective:    Vitals:   07/09/20 1013 07/09/20 1510 07/09/20 2207 07/10/20 0455  BP: (!) 157/90 (!) 145/81 (!) 114/98 139/69  Pulse: 89 89 72 82  Resp: 16 18 19 16   Temp: 98.3 F (36.8 C) 97.8 F (36.6 C) 98.1 F (36.7 C) 98.3 F (36.8  C)  TempSrc: Oral Oral Oral Oral  SpO2: 100% 99% 99% 100%  Height:        Intake/Output Summary (Last 24 hours) at 07/10/2020 1241 Last data filed at 07/10/2020 0600 Gross per 24 hour  Intake 1583.82 ml  Output 475 ml  Net 1108.82 ml   There were no vitals filed for this visit.  Exam:  General: Appearance:    Well developed, well nourished female in no acute distress     Lungs:     respirations unlabored  Heart:    Normal heart rate. Normal rhythm. No murmurs, rubs, or gallops.    MS:   All extremities are intact.    Neurologic:   Awake, tremulous        Data Reviewed:   I have personally reviewed following labs and imaging studies:  Labs: Labs show the following:   Basic Metabolic Panel: Recent Labs  Lab 07/08/20 1205 07/08/20 1643 07/09/20 0405  NA 141  --  139  K 3.3*  --  3.4*  CL 95*  --  103  CO2 22  --  28  GLUCOSE 125*  --  79  BUN 11  --  7  CREATININE 1.35* 1.02* 0.81  CALCIUM 9.8  --  7.8*  MG  --   --  1.0*   GFR CrCl cannot be calculated (Unknown ideal weight.). Liver Function Tests: Recent  Labs  Lab 07/08/20 1205 07/09/20 0405  AST 193* 109*  ALT 68* 42  ALKPHOS 45 32*  BILITOT 1.7* 1.0  PROT 8.5* 5.9*  ALBUMIN 5.1* 3.5   Recent Labs  Lab 07/08/20 1205 07/09/20 0405  LIPASE 74* 51   No results for input(s): AMMONIA in the last 168 hours. Coagulation profile Recent Labs  Lab 07/08/20 1643  INR 1.1    CBC: Recent Labs  Lab 07/08/20 1205 07/10/20 0322  WBC 6.2 3.2*  NEUTROABS 4.9 1.4*  HGB 9.2* 8.2*  HCT 31.1* 27.1*  MCV 81.6 82.4  PLT 187 127*   Cardiac Enzymes: No results for input(s): CKTOTAL, CKMB, CKMBINDEX, TROPONINI in the last 168 hours. BNP (last 3 results) No results for input(s): PROBNP in the last 8760 hours. CBG: No results for input(s): GLUCAP in the last 168 hours. D-Dimer: No results for input(s): DDIMER in the last 72 hours. Hgb A1c: No results for input(s): HGBA1C in the last 72 hours. Lipid  Profile: No results for input(s): CHOL, HDL, LDLCALC, TRIG, CHOLHDL, LDLDIRECT in the last 72 hours. Thyroid function studies: No results for input(s): TSH, T4TOTAL, T3FREE, THYROIDAB in the last 72 hours.  Invalid input(s): FREET3 Anemia work up: No results for input(s): VITAMINB12, FOLATE, FERRITIN, TIBC, IRON, RETICCTPCT in the last 72 hours. Sepsis Labs: Recent Labs  Lab 07/08/20 1205 07/10/20 0322  WBC 6.2 3.2*    Microbiology Recent Results (from the past 240 hour(s))  SARS CORONAVIRUS 2 (TAT 6-24 HRS) Nasopharyngeal Nasopharyngeal Swab     Status: None   Collection Time: 07/08/20 12:35 PM   Specimen: Nasopharyngeal Swab  Result Value Ref Range Status   SARS Coronavirus 2 NEGATIVE NEGATIVE Final    Comment: (NOTE) SARS-CoV-2 target nucleic acids are NOT DETECTED.  The SARS-CoV-2 RNA is generally detectable in upper and lower respiratory specimens during the acute phase of infection. Negative results do not preclude SARS-CoV-2 infection, do not rule out co-infections with other pathogens, and should not be used as the sole basis for treatment or other patient management decisions. Negative results must be combined with clinical observations, patient history, and epidemiological information. The expected result is Negative.  Fact Sheet for Patients: HairSlick.no  Fact Sheet for Healthcare Providers: quierodirigir.com  This test is not yet approved or cleared by the [place] FDA and  has been authorized for detection and/or diagnosis of SARS-CoV-2 by FDA under an Emergency Use Authorization (EUA). This EUA will remain  in effect (meaning this test can be used) for the duration of the COVID-19 declaration under Se ction 564(b)(1) of the Act, 21 U.S.C. section 360bbb-3(b)(1), unless the authorization is terminated or revoked sooner.  Performed at [facility], [address] [place],  [place] [address]     Procedures and diagnostic studies:  CT ABDOMEN PELVIS W CONTRAST  Result Date: 07/08/2020 CLINICAL DATA:  Abdominal pain with elevated lipase and transaminitis. EXAM: CT ABDOMEN AND PELVIS WITH CONTRAST TECHNIQUE: Multidetector CT imaging of the abdomen and pelvis was performed using the standard protocol following bolus administration of intravenous contrast. CONTRAST:  OMNIPAQUE IOHEXOL 300 MG/ML  SOLN COMPARISON:  02/25/2020 FINDINGS: Lower chest: Lung bases are clear. Mild wall thickening in the distal esophagus. Evidence for coronary artery calcifications. Hepatobiliary: Liver is markedly low density. Findings are compatible with steatosis. No acute abnormality to the gallbladder. No significant biliary dilatation. Portal venous system is patent. Pancreas: Unremarkable. No pancreatic ductal dilatation or surrounding inflammatory changes. Spleen: Normal in size without  focal abnormality. Adrenals/Urinary Tract: Normal adrenal glands. Tiny hypodensity in the medial right kidney is too small to definitively characterize. No suspicious renal lesions. Normal appearance of the urinary bladder. Stomach/Bowel: Mild wall thickening in the distal esophagus. Normal appendix. Normal appearance of the stomach. Few dilated loops of jejunum in the left upper quadrant on series 3, image 39. No convincing evidence for obstructive process. Vascular/Lymphatic: Portal venous system is patent. Venous structures are unremarkable. Main visceral arteries are patent. Atherosclerotic disease in the abdominal aorta without aneurysm, dissection or significant stenosis. No abdominal or pelvic lymphadenopathy. Reproductive: Uterus and left adnexa are unremarkable. Right ovary/adnexa is slightly enlarged measuring to 5.0 cm and suspect there is a dominant cystic structure in this area measuring roughly 3.4 cm that could represent an ovarian cyst. Other: Negative for ascites. Negative for free air. No significant  abdominal hernia. Musculoskeletal: Mild anterolisthesis of L4 on L5. No acute bone abnormality. IMPRESSION: 1. Mild wall thickening in the distal esophagus. Findings are nonspecific but can be associated esophagitis. Mild dilatation of the proximal jejunum is nonspecific. Overall, no evidence for a bowel obstructive process. 2. Normal appearance of the pancreas without inflammatory changes. 3. Hepatic steatosis. 4. Interval enlargement of the right ovary/right adnexa with a 3.4 cm low-density structure. In a patient of this age, suspect this is related to prominent follicles or an ovarian cyst. No inflammatory changes around the right adnexa. Electronically Signed   By: Richarda Overlie M.D.   On: 07/08/2020 15:13    Medications:    alum & mag hydroxide-simeth  30 mL Oral Once   And   lidocaine  15 mL Oral Once   amLODipine  10 mg Oral Daily   diazepam  5 mg Oral TID   enoxaparin (LOVENOX) injection  40 mg Subcutaneous Q24H   folic acid  1 mg Oral Once   LORazepam  0-4 mg Intravenous Q12H   Or   LORazepam  0-4 mg Oral Q12H   LORazepam  0-4 mg Intravenous Q6H   Or   LORazepam  0-4 mg Oral Q6H   metoprolol succinate  100 mg Oral Daily   multivitamin with minerals  1 tablet Oral Daily   pantoprazole sodium  40 mg Oral BID   sertraline  100 mg Oral Daily   sucralfate  1 g Oral QID   thiamine  100 mg Oral Daily   Or   thiamine  100 mg Intravenous Daily   Continuous Infusions:  promethazine (PHENERGAN) injection (IM or IVPB) 12.5 mg (07/08/20 2151)     LOS: 2 days   Joseph Art  [facility]   How to contact the [facility] Attending or Consulting provider 7A - 7P or covering provider during after hours 7P -7A, for this patient?  Check the care team in [facility] and look for a) attending/consulting TRH provider listed and b) the [facility] team listed Log into [email or web address] and use [place]'s universal password to access. If you do not have the password, please contact the hospital operator. Locate  the [facility] provider you are looking for under [facility] and page to a number that you can be directly reached. If you still have difficulty reaching the provider, please page the [facility] (Director on Call) for the Hospitalists listed on amion for assistance.  07/10/2020, 12:41 PM

## 2020-07-11 DIAGNOSIS — F10929 Alcohol use, unspecified with intoxication, unspecified: Secondary | ICD-10-CM

## 2020-07-11 LAB — CBC
HCT: 28.3 % — ABNORMAL LOW (ref 36.0–46.0)
Hemoglobin: 8.4 g/dL — ABNORMAL LOW (ref 12.0–15.0)
MCH: 24.2 pg — ABNORMAL LOW (ref 26.0–34.0)
MCHC: 29.7 g/dL — ABNORMAL LOW (ref 30.0–36.0)
MCV: 81.6 fL (ref 80.0–100.0)
Platelets: 143 10*3/uL — ABNORMAL LOW (ref 150–400)
RBC: 3.47 MIL/uL — ABNORMAL LOW (ref 3.87–5.11)
RDW: 20.6 % — ABNORMAL HIGH (ref 11.5–15.5)
WBC: 2.9 10*3/uL — ABNORMAL LOW (ref 4.0–10.5)
nRBC: 0 % (ref 0.0–0.2)

## 2020-07-11 LAB — BASIC METABOLIC PANEL
Anion gap: 8 (ref 5–15)
BUN: 5 mg/dL — ABNORMAL LOW (ref 6–20)
CO2: 26 mmol/L (ref 22–32)
Calcium: 9.1 mg/dL (ref 8.9–10.3)
Chloride: 101 mmol/L (ref 98–111)
Creatinine, Ser: 0.64 mg/dL (ref 0.44–1.00)
GFR, Estimated: >60 mL/min (ref >60)
Glucose, Bld: 92 mg/dL (ref 70–99)
Potassium: 3.3 mmol/L — ABNORMAL LOW (ref 3.5–5.1)
Sodium: 135 mmol/L (ref 135–145)

## 2020-07-11 LAB — MAGNESIUM: Magnesium: 1.9 mg/dL (ref 1.7–2.4)

## 2020-07-11 NOTE — Progress Notes (Signed)
Progress Note    Nicole Hughes  SPQ:[id number] DOB: 10-02-1970  DOA: 07/08/2020 PCP: Judy Pimple, MD    Brief Narrative:   50 y.o. female with medical history significant of alcohol abuse--patient had a recent binge of 2-1/2 weeks about 1/5 of liquor anxiety/depression, chronic back pain, IBS, HTN, remote history of provoked DVT and PE during pregnancy off anticoagulation, presented with alcohol withdrawal symptoms.    Assessment/Plan:   Active Problems:   Alcohol withdrawal syndrome (HCC)   Alcohol withdrawal (HCC)   Alcohol withdrawal, acute, severe -CIWA between 8 and 11 -Continue withdrawal precautions at this time   IBS flareup with cramping-like abdominal pain and diarrhea -Stop Levsin -CT abd w/o concerning features   Elevated LFTs possibly secondary to chronic ethanolism with flare secondary to binge drinking -Seems to be chronic, no RUQ abdominal pain. -Likely related to alcohol abuse, suspect alcoholic hepatitis -I have explained to her she might have alcoholic hepatitis and that this may be causing some nausea   AKI -resolved   HTN -Uncontrolled, resume home BP meds   History of esophagitis and gastritis with c/o feeling like food getting stuck -PPI initiated this admission, on Carafate 4 times daily -Seen by GI 03/03/2018--and also had endoscopy prior [place] [facility] 06/21/2019 showing esophagitis -DG esophagus is negative so we will hold any further work-up at this time and advance diet to regular -If further issues will need to speak with GI   Hypokalemia/hypomagnesemia -replete, given K. Dur 40    Family Communication/Anticipated D/C date and plan/Code Status   DVT prophylaxis: Lovenox ordered. Code Status: Full Code.  Disposition Plan: Status is: Inpatient  Remains inpatient appropriate because:Inpatient level of care appropriate due to severity of illness  Dispo: The patient is from: Home              Anticipated d/c is to:  Home              Patient currently is not medically stable to d/c.   Difficult to place patient No         Medical Consultants:   None.    Subjective:   Still has some nausea Tells me fell off the bandwagon recently because of father's death Mild discomfort in the chest no crushing chest pain - fever - chills - blurred vision - double vision - unilateral weakness Has not ambulated outside of the room  Objective:    Vitals:   07/10/20 1803 07/10/20 2052 07/11/20 0442 07/11/20 0945  BP: (!) 155/89 (!) 150/87 (!) 159/92 (!) 154/88  Pulse: 83 86 68 76  Resp: 16 18 18 20   Temp: 98.5 F (36.9 C) 100.1 F (37.8 C) 98.7 F (37.1 C) 98.4 F (36.9 C)  TempSrc: Oral Oral Oral Oral  SpO2: 100% 97% 100% 100%  Height:        Intake/Output Summary (Last 24 hours) at 07/11/2020 1128 Last data filed at 07/10/2020 1900 Gross per 24 hour  Intake 220 ml  Output --  Net 220 ml    There were no vitals filed for this visit.  Exam:  General: Appearance:    Well developed, well nourished female in no acute distress     Lungs:     respirations unlabored  Heart:    Normal heart rate. Normal rhythm. No murmurs, rubs, or gallops.    MS:   All extremities are intact.    Neurologic:   Awake, tremulous  Data Reviewed:   I have personally reviewed following labs and imaging studies:  Labs: Labs show the following:   Basic Metabolic Panel: Recent Labs  Lab 07/08/20 1205 07/08/20 1643 07/09/20 0405 07/11/20 0306  NA 141  --  139 135  K 3.3*  --  3.4* 3.3*  CL 95*  --  103 101  CO2 22  --  28 26  GLUCOSE 125*  --  79 92  BUN 11  --  7 5*  CREATININE 1.35* 1.02* 0.81 0.64  CALCIUM 9.8  --  7.8* 9.1  MG  --   --  1.0* 1.9    GFR CrCl cannot be calculated (Unknown ideal weight.). Liver Function Tests: Recent Labs  Lab 07/08/20 1205 07/09/20 0405  AST 193* 109*  ALT 68* 42  ALKPHOS 45 32*  BILITOT 1.7* 1.0  PROT 8.5* 5.9*  ALBUMIN 5.1* 3.5    Recent  Labs  Lab 07/08/20 1205 07/09/20 0405  LIPASE 74* 51    No results for input(s): AMMONIA in the last 168 hours. Coagulation profile Recent Labs  Lab 07/08/20 1643  INR 1.1     CBC: Recent Labs  Lab 07/08/20 1205 07/10/20 0322 07/11/20 0306  WBC 6.2 3.2* 2.9*  NEUTROABS 4.9 1.4*  --   HGB 9.2* 8.2* 8.4*  HCT 31.1* 27.1* 28.3*  MCV 81.6 82.4 81.6  PLT 187 127* 143*    Cardiac Enzymes: No results for input(s): CKTOTAL, CKMB, CKMBINDEX, TROPONINI in the last 168 hours. BNP (last 3 results) No results for input(s): PROBNP in the last 8760 hours. CBG: No results for input(s): GLUCAP in the last 168 hours. D-Dimer: No results for input(s): DDIMER in the last 72 hours. Hgb A1c: No results for input(s): HGBA1C in the last 72 hours. Lipid Profile: No results for input(s): CHOL, HDL, LDLCALC, TRIG, CHOLHDL, LDLDIRECT in the last 72 hours. Thyroid function studies: No results for input(s): TSH, T4TOTAL, T3FREE, THYROIDAB in the last 72 hours.  Invalid input(s): FREET3 Anemia work up: No results for input(s): VITAMINB12, FOLATE, FERRITIN, TIBC, IRON, RETICCTPCT in the last 72 hours. Sepsis Labs: Recent Labs  Lab 07/08/20 1205 07/10/20 0322 07/11/20 0306  WBC 6.2 3.2* 2.9*     Microbiology Recent Results (from the past 240 hour(s))  SARS CORONAVIRUS 2 (TAT 6-24 HRS) Nasopharyngeal Nasopharyngeal Swab     Status: None   Collection Time: 07/08/20 12:35 PM   Specimen: Nasopharyngeal Swab  Result Value Ref Range Status   SARS Coronavirus 2 NEGATIVE NEGATIVE Final    Comment: (NOTE) SARS-CoV-2 target nucleic acids are NOT DETECTED.  The SARS-CoV-2 RNA is generally detectable in upper and lower respiratory specimens during the acute phase of infection. Negative results do not preclude SARS-CoV-2 infection, do not rule out co-infections with other pathogens, and should not be used as the sole basis for treatment or other patient management decisions. Negative  results must be combined with clinical observations, patient history, and epidemiological information. The expected result is Negative.  Fact Sheet for Patients: HairSlick.no  Fact Sheet for Healthcare Providers: quierodirigir.com  This test is not yet approved or cleared by the [place] FDA and  has been authorized for detection and/or diagnosis of SARS-CoV-2 by FDA under an Emergency Use Authorization (EUA). This EUA will remain  in effect (meaning this test can be used) for the duration of the COVID-19 declaration under Se ction 564(b)(1) of the Act, 21 U.S.C. section 360bbb-3(b)(1), unless the authorization is terminated or revoked sooner.  Performed at [facility], [address], [place], [place] [address]     Procedures and diagnostic studies:  DG ESOPHAGUS W DOUBLE CM (HD)  Result Date: 07/10/2020 CLINICAL DATA:  Dysphagia.  Weight loss.  Alcohol detoxification. EXAM: ESOPHOGRAM / BARIUM SWALLOW / BARIUM TABLET STUDY TECHNIQUE: Combined double contrast and single contrast examination performed using effervescent crystals, thick barium liquid, and thin barium liquid. The patient was observed with fluoroscopy swallowing a 13 mm barium sulphate tablet. FLUOROSCOPY TIME:  Fluoroscopy Time:  2 minutes and 30 seconds Radiation Exposure Index (if provided by the fluoroscopic device): 26.2 Number of Acquired Spot Images: None COMPARISON:  Abdominopelvic CT of 2 days ago. FINDINGS: Double contrast evaluation of the esophagus demonstrates no mucosal abnormality. Evaluation of primary peristalsis demonstrates a normal primary peristaltic wave on each swallow. Full column evaluation of the esophagus demonstrates no persistent narrowing or stricture. No gastroesophageal reflux with water swallows. A 13 mm barium tablet passes promptly. IMPRESSION: Normal esophagram.  No explanation for patient's symptoms. Electronically Signed    By: Jeronimo Greaves M.D.   On: 07/10/2020 14:57    Medications:    alum & mag hydroxide-simeth  30 mL Oral Once   And   lidocaine  15 mL Oral Once   amLODipine  10 mg Oral Daily   diazepam  5 mg Oral TID   enoxaparin (LOVENOX) injection  40 mg Subcutaneous Q24H   folic acid  1 mg Oral Once   LORazepam  0-4 mg Intravenous Q12H   Or   LORazepam  0-4 mg Oral Q12H   LORazepam  0-4 mg Intravenous Q6H   Or   LORazepam  0-4 mg Oral Q6H   metoprolol succinate  100 mg Oral Daily   multivitamin with minerals  1 tablet Oral Daily   pantoprazole sodium  40 mg Oral BID   sertraline  100 mg Oral Daily   sucralfate  1 g Oral QID   thiamine  100 mg Oral Daily   Or   thiamine  100 mg Intravenous Daily   Continuous Infusions:  promethazine (PHENERGAN) injection (IM or IVPB) 12.5 mg (07/10/20 2241)     LOS: 3 days   Jai-Gurmukh Sayed Apostol  [facility]   How to contact the [facility] Attending or Consulting provider 7A - 7P or covering provider during after hours 7P -7A, for this patient?  Check the care team in [facility] and look for a) attending/consulting TRH provider listed and b) the [facility] team listed Log into [email or web address] and use [facility]'s universal password to access. If you do not have the password, please contact the hospital operator. Locate the [facility] provider you are looking for under [facility] and page to a number that you can be directly reached. If you still have difficulty reaching the provider, please page the [facility] (Director on Call) for the Hospitalists listed on amion for assistance.  07/11/2020, 11:28 AM
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
## 2020-07-11 NOTE — Plan of Care (Signed)
  Problem: Health Behavior/Discharge Planning: Goal: Ability to manage health-related needs will improve Outcome: Progressing   Problem: Activity: Goal: Risk for activity intolerance will decrease Outcome: Progressing   Problem: Nutrition: Goal: Adequate nutrition will be maintained Outcome: Progressing   

## 2020-07-12 ENCOUNTER — Telehealth: Payer: Self-pay

## 2020-07-12 LAB — CBC WITH DIFFERENTIAL/PLATELET
Abs Immature Granulocytes: 0 10*3/uL (ref 0.00–0.07)
Basophils Absolute: 0 10*3/uL (ref 0.0–0.1)
Basophils Relative: 1 %
Eosinophils Absolute: 0.1 10*3/uL (ref 0.0–0.5)
Eosinophils Relative: 4 %
HCT: 27.2 % — ABNORMAL LOW (ref 36.0–46.0)
Hemoglobin: 8.4 g/dL — ABNORMAL LOW (ref 12.0–15.0)
Immature Granulocytes: 0 %
Lymphocytes Relative: 39 %
Lymphs Abs: 1.1 10*3/uL (ref 0.7–4.0)
MCH: 25.1 pg — ABNORMAL LOW (ref 26.0–34.0)
MCHC: 30.9 g/dL (ref 30.0–36.0)
MCV: 81.2 fL (ref 80.0–100.0)
Monocytes Absolute: 0.3 10*3/uL (ref 0.1–1.0)
Monocytes Relative: 12 %
Neutro Abs: 1.2 10*3/uL — ABNORMAL LOW (ref 1.7–7.7)
Neutrophils Relative %: 44 %
Platelets: 148 10*3/uL — ABNORMAL LOW (ref 150–400)
RBC: 3.35 MIL/uL — ABNORMAL LOW (ref 3.87–5.11)
RDW: 20.4 % — ABNORMAL HIGH (ref 11.5–15.5)
WBC: 2.7 10*3/uL — ABNORMAL LOW (ref 4.0–10.5)
nRBC: 0 % (ref 0.0–0.2)

## 2020-07-12 LAB — COMPREHENSIVE METABOLIC PANEL
ALT: 102 U/L — ABNORMAL HIGH (ref 0–44)
AST: 228 U/L — ABNORMAL HIGH (ref 15–41)
Albumin: 3.6 g/dL (ref 3.5–5.0)
Alkaline Phosphatase: 40 U/L (ref 38–126)
Anion gap: 10 (ref 5–15)
BUN: 7 mg/dL (ref 6–20)
CO2: 23 mmol/L (ref 22–32)
Calcium: 9 mg/dL (ref 8.9–10.3)
Chloride: 102 mmol/L (ref 98–111)
Creatinine, Ser: 0.62 mg/dL (ref 0.44–1.00)
GFR, Estimated: >60 mL/min (ref >60)
Glucose, Bld: 92 mg/dL (ref 70–99)
Potassium: 3 mmol/L — ABNORMAL LOW (ref 3.5–5.1)
Sodium: 135 mmol/L (ref 135–145)
Total Bilirubin: 0.8 mg/dL (ref 0.3–1.2)
Total Protein: 6.4 g/dL — ABNORMAL LOW (ref 6.5–8.1)

## 2020-07-12 LAB — MAGNESIUM: Magnesium: 1.5 mg/dL — ABNORMAL LOW (ref 1.7–2.4)

## 2020-07-12 MED ORDER — POTASSIUM CHLORIDE CRYS ER 20 MEQ PO TBCR
40.0000 meq | EXTENDED_RELEASE_TABLET | Freq: Two times a day (BID) | ORAL | Status: DC
  Administered 2020-07-12: 40 meq via ORAL
  Filled 2020-07-12: qty 2

## 2020-07-12 MED ORDER — PANTOPRAZOLE SODIUM 40 MG PO PACK: 40.0000 mg | PACK | Freq: Two times a day (BID) | ORAL | 0 refills | Status: DC

## 2020-07-12 MED ORDER — TRAZODONE HCL 50 MG PO TABS: 50.0000 mg | ORAL_TABLET | Freq: Every evening | ORAL | 0 refills | Status: DC | PRN

## 2020-07-12 MED ORDER — POTASSIUM CHLORIDE CRYS ER 20 MEQ PO TBCR: 40.0000 meq | EXTENDED_RELEASE_TABLET | Freq: Two times a day (BID) | ORAL | 0 refills | Status: DC

## 2020-07-12 MED ORDER — SUCRALFATE 1 G PO TABS: 1.0000 g | ORAL_TABLET | Freq: Four times a day (QID) | ORAL | 1 refills | Status: DC

## 2020-07-12 NOTE — Discharge Summary (Signed)
Physician Discharge Summary  Nicole RossettiMargaret M Link [id number] DOB: [birth year] DOA: 07/08/2020  PCP: Judy Pimpleower, Marne A, MD  Admit date: 07/08/2020 Discharge date: 07/12/2020  Time spent: 37 minutes  Recommendations for Outpatient Follow-up:  Needs outpatient potassium, magnesium check 1 week Continue outpatient cessation counseling regarding alcohol May require outpatient referral [facility] for nonemergent upper endoscopy-this admission DG esophagus was negative for any obstruction and she was tolerating soft diet  Discharge Diagnoses:  MAIN problem for hospitalization   Acute alcohol withdrawal Dysphagia NOS  Please see below for itemized issues addressed in HOpsital- refer to other progress notes for clarity if needed  Discharge Condition: Improved  Diet recommendation: Soft diet heart healthy  There were no vitals filed for this visit.  History of present illness:  50 y.o. female with medical history significant of alcohol abuse--patient had a recent binge of 2-1/2 weeks about 1/5 of liquor anxiety/depression, chronic back pain, IBS, HTN, remote history of provoked DVT and PE during pregnancy off anticoagulation, presented with alcohol withdrawal symptoms.  Hospital Course:   Alcohol withdrawal, acute, severe -CIWA between 8 and 11 -she was less tremulous however and seem to be outside of the window of severe withdrawal   IBS flareup with cramping-like abdominal pain and diarrhea -Stop Levsin -CT abd w/o concerning features   Elevated LFTs possibly secondary to chronic ethanolism with flare secondary to binge drinking -Seems to be chronic, no RUQ abdominal pain. -Likely related to alcohol abuse, suspect alcoholic hepatitis -I have explained to her she might have alcoholic hepatitis and that this may be causing some nausea -See below   AKI -resolved   HTN -Uncontrolled, resume home BP meds   History of esophagitis and gastritis with c/o feeling like food  getting stuck -PPI initiated this admission, on Carafate 4 times daily -Seen by GI 03/03/2018--and also had endoscopy prior [place] [facility] 06/21/2019 showing esophagitis -DG esophagus is negative-she is tolerating solids and soft diet-outpatient follow-up needed with gastroenterologist   Hypokalemia/hypomagnesemia -replete, given K. Dur 40 twice daily on discharge   Discharge Exam: Vitals:   07/12/20 0659 07/12/20 1030  BP: (!) 142/89 (!) 164/64  Pulse: 88 91  Resp: 14 16  Temp: 98.6 F (37 C) 98.1 F (36.7 C)  SpO2: 98% 100%    Subj on day of d/c   Awake coherent pleasant ate a salad has not had breakfast or lunch yet today but is about to order I have verbalized to her that if she does well she will be discharging this afternoon She seems less tremulous  General Exam on discharge  EOMI NCAT no focal deficit Chest clear no rales no rhonchi Abdomen soft epigastric nontender no swelling She is not tremulous does not seem anxious and is smiling today   Discharge Instructions   Discharge Instructions     Diet - low sodium heart healthy   Complete by: As directed    Discharge instructions   Complete by: As directed    Stop drinking alcohol as best possible-continue Carafate, Protonix twice a day which we have prescribed for you, continue with soft diet or as you are able to tolerate until you are seen in the outpatient setting Potassium is low this hospital stay take supplements and get labs in about 1 to 2 weeks You have stabilized for discharge and I think you will need to follow-up with GI if you have further issues as they may consider nonemergent endoscopy   Increase activity slowly   Complete by: As  directed       Allergies as of 07/12/2020   No Known Allergies      Medication List     STOP taking these medications    lisinopril 10 MG tablet Commonly known as: ZESTRIL   omeprazole 20 MG capsule Commonly known as: PRILOSEC   sucralfate 1  GM/10ML suspension Commonly known as: CARAFATE Replaced by: sucralfate 1 g tablet       TAKE these medications    amLODipine 10 MG tablet Commonly known as: NORVASC Take 1 tablet by mouth once daily   metoprolol succinate 100 MG 24 hr tablet Commonly known as: TOPROL-XL Take 1 tablet (100 mg total) by mouth daily. Take with or immediately following a meal.   multivitamin with minerals Tabs tablet Take 1 tablet by mouth daily.   ondansetron 4 MG tablet Commonly known as: ZOFRAN Take 1 tablet (4 mg total) by mouth every 6 (six) hours as needed for nausea.   pantoprazole sodium 40 mg/20 mL Pack Commonly known as: PROTONIX Take 20 mLs (40 mg total) by mouth 2 (two) times daily.   potassium chloride SA 20 MEQ tablet Commonly known as: KLOR-CON Take 2 tablets (40 mEq total) by mouth 2 (two) times daily.   sertraline 50 MG tablet Commonly known as: ZOLOFT TAKE 1 TABLET BY MOUTH ONCE DAILY FOR 2 WEEKS THEN INCREASE TO 1&1/2 TABLETS FOR 2 WEEKS THEN 2 TABS DAILY What changed: See the new instructions.   sucralfate 1 g tablet Commonly known as: Carafate Take 1 tablet (1 g total) by mouth 4 (four) times daily. Replaces: sucralfate 1 GM/10ML suspension   traZODone 50 MG tablet Commonly known as: DESYREL Take 1 tablet (50 mg total) by mouth at bedtime as needed for sleep.       No Known Allergies    The results of significant diagnostics from this hospitalization (including imaging, microbiology, ancillary and laboratory) are listed below for reference.    Significant Diagnostic Studies: CT ABDOMEN PELVIS W CONTRAST  Result Date: 07/08/2020 CLINICAL DATA:  Abdominal pain with elevated lipase and transaminitis. EXAM: CT ABDOMEN AND PELVIS WITH CONTRAST TECHNIQUE: Multidetector CT imaging of the abdomen and pelvis was performed using the standard protocol following bolus administration of intravenous contrast. CONTRAST:  OMNIPAQUE IOHEXOL 300 MG/ML  SOLN COMPARISON:   02/25/2020 FINDINGS: Lower chest: Lung bases are clear. Mild wall thickening in the distal esophagus. Evidence for coronary artery calcifications. Hepatobiliary: Liver is markedly low density. Findings are compatible with steatosis. No acute abnormality to the gallbladder. No significant biliary dilatation. Portal venous system is patent. Pancreas: Unremarkable. No pancreatic ductal dilatation or surrounding inflammatory changes. Spleen: Normal in size without focal abnormality. Adrenals/Urinary Tract: Normal adrenal glands. Tiny hypodensity in the medial right kidney is too small to definitively characterize. No suspicious renal lesions. Normal appearance of the urinary bladder. Stomach/Bowel: Mild wall thickening in the distal esophagus. Normal appendix. Normal appearance of the stomach. Few dilated loops of jejunum in the left upper quadrant on series 3, image 39. No convincing evidence for obstructive process. Vascular/Lymphatic: Portal venous system is patent. Venous structures are unremarkable. Main visceral arteries are patent. Atherosclerotic disease in the abdominal aorta without aneurysm, dissection or significant stenosis. No abdominal or pelvic lymphadenopathy. Reproductive: Uterus and left adnexa are unremarkable. Right ovary/adnexa is slightly enlarged measuring to 5.0 cm and suspect there is a dominant cystic structure in this area measuring roughly 3.4 cm that could represent an ovarian cyst. Other: Negative for ascites. Negative for free  air. No significant abdominal hernia. Musculoskeletal: Mild anterolisthesis of L4 on L5. No acute bone abnormality. IMPRESSION: 1. Mild wall thickening in the distal esophagus. Findings are nonspecific but can be associated esophagitis. Mild dilatation of the proximal jejunum is nonspecific. Overall, no evidence for a bowel obstructive process. 2. Normal appearance of the pancreas without inflammatory changes. 3. Hepatic steatosis. 4. Interval enlargement of the  right ovary/right adnexa with a 3.4 cm low-density structure. In a patient of this age, suspect this is related to prominent follicles or an ovarian cyst. No inflammatory changes around the right adnexa. Electronically Signed   By: Richarda Overlie M.D.   On: 07/08/2020 15:13   DG ESOPHAGUS W DOUBLE CM (HD)  Result Date: 07/10/2020 CLINICAL DATA:  Dysphagia.  Weight loss.  Alcohol detoxification. EXAM: ESOPHOGRAM / BARIUM SWALLOW / BARIUM TABLET STUDY TECHNIQUE: Combined double contrast and single contrast examination performed using effervescent crystals, thick barium liquid, and thin barium liquid. The patient was observed with fluoroscopy swallowing a 13 mm barium sulphate tablet. FLUOROSCOPY TIME:  Fluoroscopy Time:  2 minutes and 30 seconds Radiation Exposure Index (if provided by the fluoroscopic device): 26.2 Number of Acquired Spot Images: None COMPARISON:  Abdominopelvic CT of 2 days ago. FINDINGS: Double contrast evaluation of the esophagus demonstrates no mucosal abnormality. Evaluation of primary peristalsis demonstrates a normal primary peristaltic wave on each swallow. Full column evaluation of the esophagus demonstrates no persistent narrowing or stricture. No gastroesophageal reflux with water swallows. A 13 mm barium tablet passes promptly. IMPRESSION: Normal esophagram.  No explanation for patient's symptoms. Electronically Signed   By: Jeronimo Greaves M.D.   On: 07/10/2020 14:57    Microbiology: Recent Results (from the past 240 hour(s))  SARS CORONAVIRUS 2 (TAT 6-24 HRS) Nasopharyngeal Nasopharyngeal Swab     Status: None   Collection Time: 07/08/20 12:35 PM   Specimen: Nasopharyngeal Swab  Result Value Ref Range Status   SARS Coronavirus 2 NEGATIVE NEGATIVE Final    Comment: (NOTE) SARS-CoV-2 target nucleic acids are NOT DETECTED.  The SARS-CoV-2 RNA is generally detectable in upper and lower respiratory specimens during the acute phase of infection. Negative results do not preclude  SARS-CoV-2 infection, do not rule out co-infections with other pathogens, and should not be used as the sole basis for treatment or other patient management decisions. Negative results must be combined with clinical observations, patient history, and epidemiological information. The expected result is Negative.  Fact Sheet for Patients: HairSlick.no  Fact Sheet for Healthcare Providers: quierodirigir.com  This test is not yet approved or cleared by the [place] FDA and  has been authorized for detection and/or diagnosis of SARS-CoV-2 by FDA under an Emergency Use Authorization (EUA). This EUA will remain  in effect (meaning this test can be used) for the duration of the COVID-19 declaration under Se ction 564(b)(1) of the Act, 21 U.S.C. section 360bbb-3(b)(1), unless the authorization is terminated or revoked sooner.  Performed at [facility], [address] [place], [place] [address]      Labs: Basic Metabolic Panel: Recent Labs  Lab 07/08/20 1205 07/08/20 1643 07/09/20 0405 07/11/20 0306 07/12/20 0450  NA 141  --  139 135 135  K 3.3*  --  3.4* 3.3* 3.0*  CL 95*  --  103 101 102  CO2 22  --  28 26 23   GLUCOSE 125*  --  79 92 92  BUN 11  --  7 5* 7  CREATININE 1.35* 1.02* 0.81 0.64  0.62  CALCIUM 9.8  --  7.8* 9.1 9.0  MG  --   --  1.0* 1.9 1.5*   Liver Function Tests: Recent Labs  Lab 07/08/20 1205 07/09/20 0405 07/12/20 0450  AST 193* 109* 228*  ALT 68* 42 102*  ALKPHOS 45 32* 40  BILITOT 1.7* 1.0 0.8  PROT 8.5* 5.9* 6.4*  ALBUMIN 5.1* 3.5 3.6   Recent Labs  Lab 07/08/20 1205 07/09/20 0405  LIPASE 74* 51   No results for input(s): AMMONIA in the last 168 hours. CBC: Recent Labs  Lab 07/08/20 1205 07/10/20 0322 07/11/20 0306 07/12/20 0450  WBC 6.2 3.2* 2.9* 2.7*  NEUTROABS 4.9 1.4*  --  1.2*  HGB 9.2* 8.2* 8.4* 8.4*  HCT 31.1* 27.1* 28.3* 27.2*  MCV 81.6 82.4 81.6 81.2  PLT  187 127* 143* 148*   Cardiac Enzymes: No results for input(s): CKTOTAL, CKMB, CKMBINDEX, TROPONINI in the last 168 hours. BNP: BNP (last 3 results) No results for input(s): BNP in the last 8760 hours.  ProBNP (last 3 results) No results for input(s): PROBNP in the last 8760 hours.  CBG: No results for input(s): GLUCAP in the last 168 hours.     Signed:  Rhetta Mura MD   [facility] 07/12/2020, 12:17 PM

## 2020-07-12 NOTE — Plan of Care (Signed)

## 2020-07-12 NOTE — Telephone Encounter (Signed)
Transition Care Management Unsuccessful Follow-up Telephone Call  Date of discharge and from where:  07/12/2020, [facility]   Attempts:  1st Attempt  Reason for unsuccessful TCM follow-up call:  Left voice message

## 2020-07-12 NOTE — Progress Notes (Signed)
DISCHARGE NOTE HOME FRANCEEN ERISMAN to be discharged Home per MD order. Discussed prescriptions and follow up appointments with the patient. Prescriptions given to patient; medication list explained in detail. Patient verbalized understanding.  Skin clean, dry and intact without evidence of skin break down, no evidence of skin tears noted. IV catheter discontinued intact. Site without signs and symptoms of complications. Dressing and pressure applied. Pt denies pain at the site currently. No complaints noted.  Patient free of lines, drains, and wounds.   An After Visit Summary (AVS) was printed and given to the patient. Patient escorted via wheelchair, and discharged home via private auto.  Myrtis Hopping, RN

## 2020-07-13 ENCOUNTER — Other Ambulatory Visit: Payer: Self-pay | Admitting: Family Medicine

## 2020-07-13 NOTE — Telephone Encounter (Signed)
In that case I have to decline the refill

## 2020-07-13 NOTE — Telephone Encounter (Signed)
Med isn't on med list, pt has had multiple ER/Hospital visits but never responds to our TOC call to schedule a f/u with PCP afterwards, pt hasn't been seen since 05/31/19 and no future appts.   FYI nurse tried to call pt today to to [facility] call/appt

## 2020-07-13 NOTE — Telephone Encounter (Signed)
Transition Care Management Unsuccessful Follow-up Telephone Call  Date of discharge and from where:  07/12/2020, [facility]  Attempts:  3rd Attempt  Reason for unsuccessful TCM follow-up call:  No answer/busy, left 2 previous messages on voicemail

## 2020-07-13 NOTE — Telephone Encounter (Signed)
Transition Care Management Unsuccessful Follow-up Telephone Call  Date of discharge and from where:  07/12/2020, [facility]   Attempts:  2nd Attempt  Reason for unsuccessful TCM follow-up call:  Left voice message

## 2020-07-17 ENCOUNTER — Other Ambulatory Visit: Payer: Self-pay | Admitting: Family Medicine

## 2020-07-30 ENCOUNTER — Telehealth: Payer: Self-pay | Admitting: *Deleted

## 2020-07-30 MED ORDER — METOPROLOL SUCCINATE ER 100 MG PO TB24
100.0000 mg | ORAL_TABLET | Freq: Every day | ORAL | 0 refills | Status: DC
Start: 2020-07-30 — End: 2020-09-05

## 2020-07-30 NOTE — Telephone Encounter (Signed)
Patient left a voicemail stating that her pharmacy had requested a refill on her Metoprolol over a week ago and it was denied. Patient stated that she is recovering from the loss of her dad and alcoholism. Patient stated that she needs this mediation because she is having heart palpitations. Patient stated that she does not have any transportation at this time to come in to the office. Patient stated that she can do a phone visit if needed. Pharmacy [facility]

## 2020-07-30 NOTE — Telephone Encounter (Signed)
Please refill for a month so she has time to get transportation to get in

## 2020-07-30 NOTE — Telephone Encounter (Signed)
Med refilled for a month, will route to support pool to schedule f/u in a month with pt

## 2020-07-31 NOTE — Telephone Encounter (Signed)
Called pt to schedule appt. Pt did not answer so I left a voicemail. 

## 2020-08-27 ENCOUNTER — Other Ambulatory Visit: Payer: Self-pay | Admitting: Family Medicine

## 2020-09-01 ENCOUNTER — Other Ambulatory Visit: Payer: Self-pay | Admitting: Family Medicine

## 2020-09-04 ENCOUNTER — Other Ambulatory Visit: Payer: Self-pay | Admitting: Family Medicine

## 2020-09-04 NOTE — Telephone Encounter (Signed)
Pt doesn't know when she will have transportation due to multiple factors. Pt does have the equipment to take her vital signs so virtual visit scheduled tomorrow because pt said she is out of a lot of her meds and she is also struggling with the loss of her father and she's in the middle of a divorce

## 2020-09-04 NOTE — Telephone Encounter (Signed)
Please refill times one I need vital signs for a virtual visit -bp and pulse  Any idea when she will have transportation?

## 2020-09-04 NOTE — Telephone Encounter (Signed)
Pt left v/m with basically same message as one left in July. Pt said she still does not have transportation and cannot come in for appt. Pt understands Dr Milinda Antis wants to see her in the office but she is going thru separation with husband and pt is willing to do video visit to get refills but cannot come in and out of meds. Pt request cb after reviewed by Dr Milinda Antis.

## 2020-09-05 ENCOUNTER — Telehealth (INDEPENDENT_AMBULATORY_CARE_PROVIDER_SITE_OTHER): Payer: BC Managed Care – PPO | Admitting: Family Medicine

## 2020-09-05 ENCOUNTER — Encounter: Payer: Self-pay | Admitting: Family Medicine

## 2020-09-05 VITALS — BP 132/80 | HR 145 | Wt 145.0 lb

## 2020-09-05 DIAGNOSIS — I10 Essential (primary) hypertension: Secondary | ICD-10-CM

## 2020-09-05 DIAGNOSIS — K219 Gastro-esophageal reflux disease without esophagitis: Secondary | ICD-10-CM

## 2020-09-05 DIAGNOSIS — F1010 Alcohol abuse, uncomplicated: Secondary | ICD-10-CM

## 2020-09-05 DIAGNOSIS — G4701 Insomnia due to medical condition: Secondary | ICD-10-CM

## 2020-09-05 DIAGNOSIS — E876 Hypokalemia: Secondary | ICD-10-CM

## 2020-09-05 DIAGNOSIS — D649 Anemia, unspecified: Secondary | ICD-10-CM

## 2020-09-05 DIAGNOSIS — F10239 Alcohol dependence with withdrawal, unspecified: Secondary | ICD-10-CM

## 2020-09-05 DIAGNOSIS — F418 Other specified anxiety disorders: Secondary | ICD-10-CM

## 2020-09-05 DIAGNOSIS — R7401 Elevation of levels of liver transaminase levels: Secondary | ICD-10-CM | POA: Diagnosis not present

## 2020-09-05 DIAGNOSIS — F10939 Alcohol use, unspecified with withdrawal, unspecified: Secondary | ICD-10-CM

## 2020-09-05 MED ORDER — SERTRALINE HCL 50 MG PO TABS: 100.0000 mg | ORAL_TABLET | Freq: Every day | ORAL | 5 refills | Status: DC

## 2020-09-05 MED ORDER — TRAZODONE HCL 50 MG PO TABS: 100.0000 mg | ORAL_TABLET | Freq: Every evening | ORAL | 5 refills | Status: DC | PRN

## 2020-09-05 MED ORDER — SUCRALFATE 1 G PO TABS: 1.0000 g | ORAL_TABLET | Freq: Three times a day (TID) | ORAL | 5 refills | Status: DC

## 2020-09-05 MED ORDER — METOPROLOL SUCCINATE ER 100 MG PO TB24: 100.0000 mg | ORAL_TABLET | Freq: Every day | ORAL | 5 refills | Status: DC

## 2020-09-05 MED ORDER — POTASSIUM CHLORIDE CRYS ER 20 MEQ PO TBCR: 40.0000 meq | EXTENDED_RELEASE_TABLET | Freq: Two times a day (BID) | ORAL | 0 refills | Status: DC

## 2020-09-05 MED ORDER — AMLODIPINE BESYLATE 10 MG PO TABS: 10.0000 mg | ORAL_TABLET | Freq: Every day | ORAL | 5 refills | Status: AC

## 2020-09-05 MED ORDER — PANTOPRAZOLE SODIUM 40 MG PO TBEC
40.0000 mg | DELAYED_RELEASE_TABLET | Freq: Every day | ORAL | 5 refills | Status: DC
Start: 2020-09-05 — End: 2021-05-22

## 2020-09-05 NOTE — Assessment & Plan Note (Signed)
From menses and chronic dz /etoh abuse  Last hb 8.4 Needs labs asap (no transportation)  Last cbc-not microcytic-unsure of iron status Esophagitis a year ago  At risk for esophageal varices  Also needs GI f/u when able

## 2020-09-05 NOTE — Assessment & Plan Note (Signed)
Pt lost both parents- in grief state  Also separated from her husband  She plans to reach out to a program in [facility] for this and alcoholism  Not suicidal Trazodone inc to 100 mg for this and sleep problems Continue zoloft 100 mg daily  Counseling Enc strongly to abstain from alcohol

## 2020-09-05 NOTE — Assessment & Plan Note (Signed)
Treated in hospital with etoh withdrawal  Needs level  Labs planned

## 2020-09-05 NOTE — Progress Notes (Signed)
Virtual Visit via Video Note  I connected with Nicole Hughes on 09/05/20 at  3:00 PM EDT by a video enabled telemedicine application and verified that I am speaking with the correct person using two identifiers.  Location: Patient: home Provider: office    I discussed the limitations of evaluation and management by telemedicine and the availability of in person appointments. The patient expressed understanding and agreed to proceed.  Parties involved in encounter  Patient: Nicole Hughes  Provider:  Roxy Manns MD   History of Present Illness: Pt presents for f/u of HTN  Wt Readings from Last 3 Encounters:  09/05/20 145 lb (65.8 kg)  05/19/20 146 lb (66.2 kg)  04/16/20 160 lb (72.6 kg)   23.40 kg/m Has lost 27 lb  Not a lot of appetite    Hosp for etoh w/d early July  Noted needs mag check   Had EGD GI f/u   Feeling quite tired  Very stressed  Living in father's house (he passed away)  Grief- she found him when he passed   Separated from her husband Trying to do everything herself   Drinking has stopped Better than she was  Really does not want to see psychiatrist  Feels anxious at times- heart beats hard /worse when she lies down at night   Daughters have been great/supportive  Helping clean out the house  Lost both parents and husband -is tough Financial planner for grandson every other week- loves it ! Keeps her going     BP Readings from Last 3 Encounters:  09/05/20 132/80  07/12/20 (!) 164/64  05/22/20 139/88   Pulse Readings from Last 3 Encounters:  09/05/20 (!) 145  07/12/20 91  05/21/20 89   Hosp took her off lisinopril   Looking at a free facility in [place] for her depression /etoh  Wants to get some counseling   Has a new insurance now     Most recent labs Lab Results  Component Value Date   CREATININE 0.62 07/12/2020   BUN 7 07/12/2020   NA 135 07/12/2020   K 3.0 (L) 07/12/2020   CL 102 07/12/2020   CO2 23 07/12/2020   Lab  Results  Component Value Date   ALT 102 (H) 07/12/2020   AST 228 (H) 07/12/2020   ALKPHOS 40 07/12/2020   BILITOT 0.8 07/12/2020   Lab Results  Component Value Date   WBC 2.7 (L) 07/12/2020   HGB 8.4 (L) 07/12/2020   HCT 27.2 (L) 07/12/2020   MCV 81.2 07/12/2020   PLT 148 (L) 07/12/2020    Started having period twice per month  ? Perimenopause     Patient Active Problem List   Diagnosis Date Noted   Alcohol withdrawal (HCC) 07/08/2020   Prolonged QT interval 05/19/2020   Hypomagnesemia 12/24/2019   Dysphagia 05/31/2019   Anemia 03/31/2018   Appetite loss 03/08/2018   Myofascial pain 03/08/2018   IBS (irritable bowel syndrome) 03/08/2018   Alcoholic hepatitis without ascites 03/05/2018   Elevated LFTs 03/03/2018   Achilles tendonitis 01/28/2018   Abnormal cervical Papanicolaou smear 09/29/2017   Insomnia 09/29/2017   Chest wall pain 05/25/2017   Elevated transaminase level 05/25/2017   Depression with anxiety 02/23/2017   MRSA carrier 02/23/2017   Hypokalemia 09/24/2016   Alcohol abuse 09/22/2016   H/O acute pancreatitis 09/22/2016   GERD (gastroesophageal reflux disease) 04/09/2015   Epigastric pain 01/17/2014   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/24/2012   Chest pain 08/20/2012  Stress reaction 07/23/2012   Hypertension 06/09/2012   History of pulmonary embolism 06/09/2012   Preop cardiovascular exam 06/08/2012   Dysmenorrhea    Dyspareunia    Pelvic pain    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   GERD (gastroesophageal reflux disease)    Hay fever    "fall and spring" (02/17/2017)   Headache    "2-3 times/.wik" (02/17/2017)   History of chicken pox    Hypertension     Migraine    "monthly" (02/17/2017)   Pelvic pain    Pulmonary embolism (HCC) 08/1998   S/P childbirth   Past Surgical History:  Procedure Laterality Date   CESAREAN SECTION  1994   ECTOPIC PREGNANCY SURGERY  2010   LAPAROSCOPY N/A 09/24/2012   Procedure: OPERATIVE LAPROSCOPY WITH LYSIS OF ADHESIONS;  Surgeon: Sherron Monday, MD;  Location: [facility];  Service: Gynecology;  Laterality: N/A;   TONSILLECTOMY  1984   TUBAL LIGATION  2010   Social History   Tobacco Use   Smoking status: Never   Smokeless tobacco: Never  Vaping Use   Vaping Use: Never used  Substance Use Topics   Alcohol use: Yes    Alcohol/week: 5.0 standard drinks    Types: 5 Cans of beer per week    Comment: sometimes 5 a day   Drug use: Yes    Frequency: 1.0 times per week    Types: Marijuana    Comment: Gives you chest pain - trying to quit   Family History  Problem Relation Age of Onset   Cancer - Lung Mother    Heart failure Father    Hypertension Father    No Known Allergies Current Outpatient Medications on File Prior to Visit  Medication Sig Dispense Refill   Multiple Vitamin (MULTIVITAMIN WITH MINERALS) TABS tablet Take 1 tablet by mouth daily.     ondansetron (ZOFRAN) 4 MG tablet Take 1 tablet (4 mg total) by mouth every 6 (six) hours as needed for nausea. 20 tablet 0   No current facility-administered medications on file prior to visit.   Review of Systems  Constitutional:  Positive for malaise/fatigue. Negative for chills and fever.  HENT:  Negative for congestion, ear pain, sinus pain and sore throat.   Eyes:  Negative for blurred vision, discharge and redness.  Respiratory:  Negative for cough, shortness of breath and stridor.   Cardiovascular:  Negative for chest pain, palpitations and leg swelling.  Gastrointestinal:  Positive for abdominal pain and heartburn. Negative for blood in stool, diarrhea, melena, nausea and vomiting.  Musculoskeletal:  Negative for myalgias.  Skin:  Negative for  rash.  Neurological:  Negative for dizziness, speech change, focal weakness and headaches.  Psychiatric/Behavioral:  Positive for depression. Negative for substance abuse. The patient is nervous/anxious and has insomnia.        Holding alcohol    Observations/Objective: Patient appears well, in no distress Weight is baseline  No facial swelling or asymmetry Normal voice-not hoarse and no slurred speech No obvious tremor or mobility impairment Moving neck and UEs normally Able to hear the call well  No  cough or shortness of breath during interview  Talkative and mentally sharp with no cognitive changes No skin changes on face or neck , no rash or pallor Affect is depressed, tearful at times    Assessment and Plan:  Problem List Items Addressed This Visit       Cardiovascular and Mediastinum   Hypertension - Primary    bp in Hughes control at this time  BP Readings from Last 1 Encounters:  09/05/20 132/80  No changes needed Most recent labs reviewed  Disc lifstyle change with low sodium diet and exercise  Plan to continue Amlodipine 10 mg daily  Metoprolol xl 100 mg daily  Watching pulse rate (goes up with anxiety)  Due for labs-will need a ride as she does not have a car      Relevant Medications   amLODipine (NORVASC) 10 MG tablet   metoprolol succinate (TOPROL-XL) 100 MG 24 hr tablet   Other Relevant Orders   CBC with Differential/Platelet   Basic metabolic panel     Digestive   GERD (gastroesophageal reflux disease)    H/o gastritis and esophagitis in setting of alcohol abuse protonix and carafate started with last hospitalization  Will need GI f/u (no transportation right now)  No longer dysphagia  Refills done  Enc etoh abstinance      Relevant Medications   pantoprazole (PROTONIX) 40 MG tablet   sucralfate (CARAFATE) 1 g tablet     Nervous and Auditory   Alcohol withdrawal (HCC)    Per pt no etoh since her admission  Need to re check LFT and magnesium   Planning lab visit when she can get transportation         Other   Alcohol abuse    Abstaining since last admission  Reviewed hospital records, lab results and studies in detail   She plans to reach out to a program in [place] for counseling and care  Treating GI issues and also HTN  Also depression  Strongly recommend rehab program  Due for labs -LFT and cbc and mag and lytes       Relevant Orders   Vitamin B12   Hypokalemia    Taking 40 meq bid since hospital  Needs labs - calling to plan (no transportation)  Stressed importance of getting labs       Relevant Orders   Basic metabolic panel   Depression with anxiety    Pt lost both parents- in grief state  Also separated from her husband  She plans to reach out to a program in [facility] for this and alcoholism  Not suicidal Trazodone inc to 100 mg for this and sleep problems Continue zoloft 100 mg daily  Counseling Enc strongly to abstain from alcohol      Relevant Medications   traZODone (DESYREL) 50 MG tablet   sertraline (ZOLOFT) 50 MG tablet   Elevated transaminase level    Needs labs to re check  Per pt -no etoh since last withdrawal  Planning labs      Relevant Orders   Hepatic function panel   Insomnia    With depression and anxiety  Increased trazodone to 100 mg qhs   She is seeking psych care in [facility]      Anemia    From menses and chronic dz /etoh abuse  Last hb 8.4 Needs labs asap (no transportation)  Last cbc-not microcytic-unsure of iron status Esophagitis a year ago  At risk for esophageal varices  Also  needs GI f/u when able        Relevant Orders   CBC with Differential/Platelet   Ferritin   Iron   Vitamin B12   Hypomagnesemia    Treated in hospital with etoh withdrawal  Needs level  Labs planned       Relevant Orders   Magnesium    Follow Up Instructions: Get started with counseling (grief /alcohol) at [facility] facility [facility]  Call and make a lab appt here when  someone can bring you   Stay on medications Stick with metoprolol 100 mg  Drink lots of fluids  You can increase the trazodone to 100 mg at bedtime   We will make a plan when labs return    I discussed the assessment and treatment plan with the patient. The patient was provided an opportunity to ask questions and all were answered. The patient agreed with the plan and demonstrated an understanding of the instructions.   The patient was advised to call back or seek an in-person evaluation if the symptoms worsen or if the condition fails to improve as anticipated.     Roxy MannsMarne Liat Mayol, MD

## 2020-09-05 NOTE — Assessment & Plan Note (Signed)
H/o gastritis and esophagitis in setting of alcohol abuse protonix and carafate started with last hospitalization  Will need GI f/u (no transportation right now)  No longer dysphagia  Refills done  Enc etoh abstinance

## 2020-09-05 NOTE — Assessment & Plan Note (Signed)
Abstaining since last admission  Reviewed hospital records, lab results and studies in detail   She plans to reach out to a program in [place] for counseling and care  Treating GI issues and also HTN  Also depression  Strongly recommend rehab program  Due for labs -LFT and cbc and mag and lytes

## 2020-09-05 NOTE — Patient Instructions (Signed)
Get started with counseling (grief /alcohol) at the facility at [facility]  Call and make a lab appt here when someone can bring you   Stay on medications Stick with metoprolol 100 mg  Drink lots of fluids  You can increase the trazodone to 100 mg at bedtime   We will make a plan when labs return

## 2020-09-05 NOTE — Assessment & Plan Note (Signed)
Taking 40 meq bid since hospital  Needs labs - calling to plan (no transportation)  Stressed importance of getting labs

## 2020-09-05 NOTE — Assessment & Plan Note (Signed)
Per pt no etoh since her admission  Need to re check LFT and magnesium  Planning lab visit when she can get transportation

## 2020-09-05 NOTE — Assessment & Plan Note (Signed)
Needs labs to re check  Per pt -no etoh since last withdrawal  Planning labs

## 2020-09-05 NOTE — Assessment & Plan Note (Signed)
bp in fair control at this time  BP Readings from Last 1 Encounters:  09/05/20 132/80   No changes needed Most recent labs reviewed  Disc lifstyle change with low sodium diet and exercise  Plan to continue Amlodipine 10 mg daily  Metoprolol xl 100 mg daily  Watching pulse rate (goes up with anxiety)  Due for labs-will need a ride as she does not have a car

## 2020-09-05 NOTE — Assessment & Plan Note (Signed)
With depression and anxiety  Increased trazodone to 100 mg qhs   She is seeking psych care in [facility]

## 2020-09-11 ENCOUNTER — Encounter: Payer: Self-pay | Admitting: *Deleted

## 2020-09-12 ENCOUNTER — Telehealth: Payer: Self-pay | Admitting: *Deleted

## 2020-09-12 NOTE — Telephone Encounter (Signed)
PA started at [email or web address] for pt's protonix, I will await a response

## 2020-10-09 ENCOUNTER — Telehealth: Payer: Self-pay | Admitting: Family Medicine

## 2020-10-09 NOTE — Telephone Encounter (Signed)
Talked to patient by telephone and was advised that she has not taken her Metoprolol in over a week and she knows that is her problem. Patent stated that she does not have any transpiration and [facility] will deliver the medication to her. Patient stated that she called [facility] and was advised that they are processing her prescription. Patient was advised that a script was sent in on 09/05/20 #30/5. Patient stated the problem that she may be having is she has been requesting a refill on an old bottle. Patient stated that she will call [facility] and talk with them about the script that was sent in on 09/05/20. Patient stated that she does not have much money at this time. Patient stated that she has had a rough year because her dad passed away. Patient was given ER precautions and she verbalized understanding. .Patient stated that she checked her blood pressure and it was 144/80 but does not recall her heart rate. Patient was advised if she continues to have symptoms she needs to go to the ER.

## 2020-10-09 NOTE — Telephone Encounter (Signed)
PLEASE NOTE: All timestamps contained within this report are represented as [place] Standard Time. CONFIDENTIALTY NOTICE: This fax transmission is intended only for the addressee. It contains information that is legally privileged, confidential or otherwise protected from use or disclosure. If you are not the intended recipient, you are strictly prohibited from reviewing, disclosing, copying using or disseminating any of this information or taking any action in reliance on or regarding this information. If you have received this fax in error, please notify us immediately by telephone so that we can arrange for its return to [place]. Phone: [phone], Toll-Free: [phone], Fax: [phone] Page: 1 of 1 Call Id: [id number] [facility] Day - Client TELEPHONE ADVICE RECORD [facility] Patient Name: Nicole Hughes Gender: Female DOB: January 04, 1971 Age: 50 Y 6 M 8 D Return Phone Number: [phone] (Secondary) Address: [address] City/ State/ Zip: [place] [address] Client [facility] Day - Client Client Site [facility] - Day Physician Milinda Antis, Idamae Schuller - MD Contact Type Call Who Is Calling Patient / Member / Family / Caregiver Call Type Triage / Clinical Relationship To Patient Self Return Phone Number [phone] (Secondary) Chief Complaint CHEST PAIN - pain, pressure, heaviness or tightness Reason for Call Symptomatic / Request for Health Information Initial Comment Caller states she is having chest tightness, trouble sleeping, hot flashes and nausea. Translation No Disp. Time Lamount Cohen Time) Disposition Final User 10/09/2020 4:06:52 PM Send to Urgent Blima Singer, Tiffany 10/09/2020 4:12:16 PM Attempt made - no message left Abram Sander 10/09/2020 4:25:17 PM Attempt made - no message left Abram Sander 10/09/2020 4:45:16 PM FINAL ATTEMPT MADE - no message left Yes Daphine Deutscher, RN, Cala Bradford

## 2020-10-09 NOTE — Telephone Encounter (Signed)
  Encourage patient to contact the pharmacy for refills or they can request refills through [facility]  LAST APPOINTMENT DATE:  Please schedule appointment if longer than 1 year  NEXT APPOINTMENT DATE:  MEDICATION:amLODipine (NORVASC) 10 MG tablet  metoprolol succinate (TOPROL-XL) 100 MG 24 hr tablet   Is the patient out of medication?   PHARMACY:[id number] [place] ([place] [place] -    Let patient know to contact pharmacy at the end of the day to make sure medication is ready.  Please notify patient to allow 48-72 hours to process  CLINICAL FILLS OUT ALL BELOW:   LAST REFILL:  QTY:  REFILL DATE:    OTHER COMMENTS:    Okay for refill?  Please advise

## 2020-10-09 NOTE — Telephone Encounter (Signed)
Would she like me to look into resources to help with transportation and possibly medications ? (can try if she would like) I could see if she qualifies for help from a Child psychotherapist

## 2020-10-09 NOTE — Telephone Encounter (Signed)
I have tried to call pt. After virtual visit with provider. PCP said she had to call and schedule lab appt. Pt is overdue for labs (she never called and scheduled). Please schedule lab appt either place ([facility] or [facility]), once lab appt is scheduled please route back to me to refill meds

## 2020-10-10 NOTE — Telephone Encounter (Signed)
Tried contacting the patient but she did not answer went to voicemail. Patient was left a message for her to call back.

## 2020-10-10 NOTE — Telephone Encounter (Signed)
Called pat and left vm 

## 2020-10-11 ENCOUNTER — Emergency Department (HOSPITAL_BASED_OUTPATIENT_CLINIC_OR_DEPARTMENT_OTHER): Payer: BC Managed Care – PPO

## 2020-10-11 ENCOUNTER — Emergency Department (HOSPITAL_COMMUNITY)
Admission: EM | Admit: 2020-10-11 | Discharge: 2020-10-11 | Disposition: A | Discharge status: Home / Self Care | Payer: BC Managed Care – PPO | Attending: Emergency Medicine | Admitting: Emergency Medicine

## 2020-10-11 ENCOUNTER — Encounter (HOSPITAL_COMMUNITY): Payer: Self-pay | Admitting: *Deleted

## 2020-10-11 ENCOUNTER — Emergency Department (HOSPITAL_COMMUNITY): Payer: BC Managed Care – PPO

## 2020-10-11 DIAGNOSIS — M79605 Pain in left leg: Secondary | ICD-10-CM

## 2020-10-11 DIAGNOSIS — R109 Unspecified abdominal pain: Secondary | ICD-10-CM | POA: Diagnosis not present

## 2020-10-11 DIAGNOSIS — M79609 Pain in unspecified limb: Secondary | ICD-10-CM

## 2020-10-11 DIAGNOSIS — M25572 Pain in left ankle and joints of left foot: Secondary | ICD-10-CM | POA: Diagnosis not present

## 2020-10-11 DIAGNOSIS — R0789 Other chest pain: Secondary | ICD-10-CM | POA: Diagnosis not present

## 2020-10-11 DIAGNOSIS — M25552 Pain in left hip: Secondary | ICD-10-CM

## 2020-10-11 DIAGNOSIS — Z79899 Other long term (current) drug therapy: Secondary | ICD-10-CM | POA: Diagnosis not present

## 2020-10-11 DIAGNOSIS — K219 Gastro-esophageal reflux disease without esophagitis: Secondary | ICD-10-CM | POA: Insufficient documentation

## 2020-10-11 DIAGNOSIS — R079 Chest pain, unspecified: Secondary | ICD-10-CM | POA: Diagnosis not present

## 2020-10-11 DIAGNOSIS — M79672 Pain in left foot: Secondary | ICD-10-CM | POA: Diagnosis not present

## 2020-10-11 DIAGNOSIS — I10 Essential (primary) hypertension: Secondary | ICD-10-CM | POA: Diagnosis not present

## 2020-10-11 DIAGNOSIS — R000 Tachycardia, unspecified: Secondary | ICD-10-CM | POA: Insufficient documentation

## 2020-10-11 DIAGNOSIS — R102 Pelvic and perineal pain: Secondary | ICD-10-CM | POA: Diagnosis not present

## 2020-10-11 DIAGNOSIS — R0600 Dyspnea, unspecified: Secondary | ICD-10-CM | POA: Diagnosis not present

## 2020-10-11 DIAGNOSIS — R0781 Pleurodynia: Secondary | ICD-10-CM | POA: Insufficient documentation

## 2020-10-11 DIAGNOSIS — E876 Hypokalemia: Secondary | ICD-10-CM | POA: Insufficient documentation

## 2020-10-11 DIAGNOSIS — R0602 Shortness of breath: Secondary | ICD-10-CM | POA: Diagnosis not present

## 2020-10-11 LAB — ETHANOL: Alcohol, Ethyl (B): <10 mg/dL (ref <10)

## 2020-10-11 LAB — I-STAT BETA HCG BLOOD, ED (MC, WL, AP ONLY): I-stat hCG, quantitative: <5 m[IU]/mL (ref <5)

## 2020-10-11 LAB — CBC
HCT: 34.9 % — ABNORMAL LOW (ref 36.0–46.0)
Hemoglobin: 11.1 g/dL — ABNORMAL LOW (ref 12.0–15.0)
MCH: 26.7 pg (ref 26.0–34.0)
MCHC: 31.8 g/dL (ref 30.0–36.0)
MCV: 84.1 fL (ref 80.0–100.0)
Platelets: 183 10*3/uL (ref 150–400)
RBC: 4.15 MIL/uL (ref 3.87–5.11)
RDW: 18.6 % — ABNORMAL HIGH (ref 11.5–15.5)
WBC: 6.1 10*3/uL (ref 4.0–10.5)
nRBC: 0 % (ref 0.0–0.2)

## 2020-10-11 LAB — BASIC METABOLIC PANEL
Anion gap: 15 (ref 5–15)
BUN: 6 mg/dL (ref 6–20)
CO2: 22 mmol/L (ref 22–32)
Calcium: 9.6 mg/dL (ref 8.9–10.3)
Chloride: 93 mmol/L — ABNORMAL LOW (ref 98–111)
Creatinine, Ser: 0.85 mg/dL (ref 0.44–1.00)
GFR, Estimated: >60 mL/min (ref >60)
Glucose, Bld: 152 mg/dL — ABNORMAL HIGH (ref 70–99)
Potassium: 3 mmol/L — ABNORMAL LOW (ref 3.5–5.1)
Sodium: 130 mmol/L — ABNORMAL LOW (ref 135–145)

## 2020-10-11 LAB — HEPATIC FUNCTION PANEL
ALT: 47 U/L — ABNORMAL HIGH (ref 0–44)
AST: 193 U/L — ABNORMAL HIGH (ref 15–41)
Albumin: 4.7 g/dL (ref 3.5–5.0)
Alkaline Phosphatase: 64 U/L (ref 38–126)
Bilirubin, Direct: 0.4 mg/dL — ABNORMAL HIGH (ref 0.0–0.2)
Indirect Bilirubin: 0.7 mg/dL (ref 0.3–0.9)
Total Bilirubin: 1.1 mg/dL (ref 0.3–1.2)
Total Protein: 8 g/dL (ref 6.5–8.1)

## 2020-10-11 LAB — TROPONIN I (HIGH SENSITIVITY)
Troponin I (High Sensitivity): 6 ng/L (ref <18)
Troponin I (High Sensitivity): 5 ng/L (ref <18)

## 2020-10-11 LAB — LIPASE, BLOOD: Lipase: 90 U/L — ABNORMAL HIGH (ref 11–51)

## 2020-10-11 MED ORDER — LORAZEPAM 1 MG PO TABS: 0.0000 mg | ORAL_TABLET | Freq: Two times a day (BID) | ORAL | Status: DC

## 2020-10-11 MED ORDER — LACTATED RINGERS IV BOLUS
2000.0000 mL | Freq: Once | INTRAVENOUS | Status: AC
  Administered 2020-10-11: 2000 mL via INTRAVENOUS

## 2020-10-11 MED ORDER — POTASSIUM CHLORIDE CRYS ER 20 MEQ PO TBCR
40.0000 meq | EXTENDED_RELEASE_TABLET | Freq: Once | ORAL | Status: AC
  Administered 2020-10-11: 40 meq via ORAL
  Filled 2020-10-11: qty 2

## 2020-10-11 MED ORDER — THIAMINE HCL 100 MG PO TABS
100.0000 mg | ORAL_TABLET | Freq: Every day | ORAL | Status: DC
  Administered 2020-10-11: 100 mg via ORAL
  Filled 2020-10-11: qty 1

## 2020-10-11 MED ORDER — LORAZEPAM 2 MG/ML IJ SOLN: 0.0000 mg | Freq: Four times a day (QID) | INTRAMUSCULAR | Status: DC

## 2020-10-11 MED ORDER — LORAZEPAM 2 MG/ML IJ SOLN: 0.0000 mg | Freq: Two times a day (BID) | INTRAMUSCULAR | Status: DC

## 2020-10-11 MED ORDER — LORAZEPAM 1 MG PO TABS
0.0000 mg | ORAL_TABLET | Freq: Four times a day (QID) | ORAL | Status: DC
  Administered 2020-10-11: 1 mg via ORAL
  Filled 2020-10-11: qty 1

## 2020-10-11 MED ORDER — THIAMINE HCL 100 MG/ML IJ SOLN
100.0000 mg | Freq: Every day | INTRAMUSCULAR | Status: DC
Start: 2020-10-11 — End: 2020-10-11

## 2020-10-11 MED ORDER — LORAZEPAM 2 MG/ML IJ SOLN
2.0000 mg | Freq: Once | INTRAMUSCULAR | Status: AC
  Administered 2020-10-11: 2 mg via INTRAVENOUS
  Filled 2020-10-11: qty 1

## 2020-10-11 MED ORDER — LORAZEPAM 1 MG PO TABS
1.0000 mg | ORAL_TABLET | Freq: Once | ORAL | Status: AC
  Administered 2020-10-11: 1 mg via ORAL
  Filled 2020-10-11: qty 1

## 2020-10-11 MED ORDER — SODIUM CHLORIDE (PF) 0.9 % IJ SOLN
INTRAMUSCULAR | Status: AC
  Filled 2020-10-11: qty 50

## 2020-10-11 MED ORDER — LACTATED RINGERS IV SOLN: INTRAVENOUS | Status: DC

## 2020-10-11 MED ORDER — IOHEXOL 350 MG/ML SOLN
75.0000 mL | Freq: Once | INTRAVENOUS | Status: AC | PRN
  Administered 2020-10-11: 75 mL via INTRAVENOUS

## 2020-10-11 MED ORDER — KETOROLAC TROMETHAMINE 30 MG/ML IJ SOLN
30.0000 mg | Freq: Once | INTRAMUSCULAR | Status: AC
  Administered 2020-10-11: 30 mg via INTRAVENOUS
  Filled 2020-10-11: qty 1

## 2020-10-11 NOTE — ED Notes (Signed)
Patient given food

## 2020-10-11 NOTE — Discharge Instructions (Addendum)
Followup with your doctor as needed

## 2020-10-11 NOTE — ED Triage Notes (Signed)
Pt complains of left foot pain radiating up left leg x 2 days. Chest pain that started yesterday. Hx of DVT, not on blood thinners. Also concerned for UTI.

## 2020-10-11 NOTE — Telephone Encounter (Signed)
2nd attempt - LMTCB to schedule lab appt.

## 2020-10-11 NOTE — Progress Notes (Signed)
LLE venous duplex has been completed.  Preliminary results given to Dr. Freida Busman via secure chat.  Results can be found under chart review under CV PROC. 10/11/2020 12:09 PM Harith Mccadden RVT, RDMS

## 2020-10-11 NOTE — ED Provider Notes (Addendum)
Emergency Medicine Provider Triage Evaluation Note  Nicole Hughes , a 50 y.o. female  was evaluated in triage.  Pt complains of pain in top of left foot onset 2-3 days ago, now radiates up to right SI. Also CP, yesterday, constant, worse with breathing, sharp in nature, located left upper chest, radiates into neck and left arm. History post partum cardiomyopathy 2000. Also SHOB with history of PE in 08/1998. Not anticoagulated. Also concern for liver problem, reports heavy alcohol use, has cut back recently but reports pain right back over liver and radiates to her kidneys. Urine with foul odor and bright yellow.   Review of Systems  Positive: CP, SHOB, leg pain, cold/hot sweats (unsure if menopause), diarrhea Negative: Vomiting, constipation   Physical Exam  BP (!) 151/99 (BP Location: Left Arm)   Pulse (!) 103   Temp 98.2 F (36.8 C) (Oral)   Resp 18   SpO2 99%  Gen:   Awake, tearful, tremulous    Resp:  Normal effort  MSK:   Moves extremities without difficulty  Other:    Medical Decision Making  Medically screening exam initiated at 9:14 AM.  Appropriate orders placed.  Nicole Hughes was informed that the remainder of the evaluation will be completed by another provider, this initial triage assessment does not replace that evaluation, and the importance of remaining in the ED until their evaluation is complete.     Jeannie Fend, PA-C 10/11/20 [id number]    Jeannie Fend, PA-C 10/11/20 0920    Terrilee Files, MD 10/11/20 [phone]

## 2020-10-11 NOTE — ED Provider Notes (Signed)
[facility]-EMERGENCY DEPT Provider Note   CSN: [id number] Arrival date & time: 10/11/20  4259     History Chief Complaint  Patient presents with   Leg Pain   Chest Pain    Nicole Hughes is a 50 y.o. female.  50 year old female presents with abdominal discomfort which radiates to left side of her chest.  Has a history of PE and has had some pleuritic chest pain.  Endorses dyspnea exertion.  Denies any fever or chills.  No emesis noted.  Patient states that she does drink heavy EtOH daily and has been trying to cut back.  Does endorse some withdrawal symptoms of tremors.  Denies any hallucinations.  No anginal type symptoms.  Also notes severe pain to her left foot that radiates up into her left leg.  Characterizes that pain is sharp and worse when she tries to ambulate.  Has had some sciatica symptoms with this.      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   GERD (gastroesophageal reflux disease)    Hay fever    "fall and spring" (02/17/2017)   Headache    "2-3 times/.wik" (02/17/2017)   History of chicken pox    Hypertension    Migraine    "monthly" (02/17/2017)   Pelvic pain    Pulmonary embolism (HCC) 08/1998   S/P childbirth    Patient Active Problem List   Diagnosis Date Noted   Alcohol withdrawal (HCC) 07/08/2020   Prolonged QT interval 05/19/2020   Hypomagnesemia 12/24/2019   Dysphagia 05/31/2019   Anemia 03/31/2018   Appetite loss 03/08/2018   Myofascial pain 03/08/2018   IBS (irritable bowel syndrome) 03/08/2018   Alcoholic hepatitis without ascites 03/05/2018   Elevated LFTs 03/03/2018   Achilles tendonitis 01/28/2018   Abnormal cervical Papanicolaou  smear 09/29/2017   Insomnia 09/29/2017   Chest wall pain 05/25/2017   Elevated transaminase level 05/25/2017   Depression with anxiety 02/23/2017   MRSA carrier 02/23/2017   Hypokalemia 09/24/2016   Alcohol abuse 09/22/2016   H/O acute pancreatitis 09/22/2016   GERD (gastroesophageal reflux disease) 04/09/2015   Epigastric pain 01/17/2014   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     Family History  Problem Relation Age of Onset   Cancer - Lung Mother    Heart failure Father    Hypertension Father     Social History   Tobacco Use   Smoking status: Never  Smokeless tobacco: Never  Vaping Use   Vaping Use: Never used  Substance Use Topics   Alcohol use: Yes    Alcohol/week: 5.0 standard drinks    Types: 5 Cans of beer per week    Comment: sometimes 5 a day   Drug use: Yes    Frequency: 1.0 times per week    Types: Marijuana    Comment: Gives you chest pain - trying to quit    Home Medications Prior to Admission medications   Medication Sig Start Date End Date Taking? Authorizing Provider  amLODipine (NORVASC) 10 MG tablet Take 1 tablet (10 mg total) by mouth daily. 09/05/20   Tower, Audrie Gallus, MD  metoprolol succinate (TOPROL-XL) 100 MG 24 hr tablet Take 1 tablet (100 mg total) by mouth daily. Take with or immediately following a meal. 09/05/20 12/04/20  Tower, Audrie Gallus, MD   Multiple Vitamin (MULTIVITAMIN WITH MINERALS) TABS tablet Take 1 tablet by mouth daily. 12/26/19   Alwyn Ren, MD  ondansetron (ZOFRAN) 4 MG tablet Take 1 tablet (4 mg total) by mouth every 6 (six) hours as needed for nausea. 12/26/19   Alwyn Ren, MD  pantoprazole (PROTONIX) 40 MG tablet Take 1 tablet (40 mg total) by mouth daily. 09/05/20   Tower, Audrie Gallus, MD  potassium chloride SA (KLOR-CON) 20 MEQ tablet Take 2 tablets (40 mEq total) by mouth 2 (two) times daily. 09/05/20   Tower, Audrie Gallus, MD  sertraline (ZOLOFT) 50 MG tablet Take 2 tablets (100 mg total) by mouth daily. 09/05/20   Tower, Audrie Gallus, MD  sucralfate (CARAFATE) 1 g tablet Take 1 tablet (1 g total) by mouth 4 (four) times daily -  with meals and at bedtime. 09/05/20   Tower, Audrie Gallus, MD  traZODone (DESYREL) 50 MG tablet Take 2 tablets (100 mg total) by mouth at bedtime as needed for sleep. 09/05/20   Tower, Audrie Gallus, MD    Allergies    Patient has no known allergies.  Review of Systems   Review of Systems  All other systems reviewed and are negative.  Physical Exam Updated Vital Signs BP (!) 151/99 (BP Location: Left Arm)   Pulse (!) 103   Temp 98.2 F (36.8 C) (Oral)   Resp 18   SpO2 99%   Physical Exam Vitals and nursing note reviewed.  Constitutional:      General: She is not in acute distress.    Appearance: Normal appearance. She is well-developed. She is not toxic-appearing.  HENT:     Head: Normocephalic and atraumatic.  Eyes:     General: Lids are normal.     Conjunctiva/sclera: Conjunctivae normal.     Pupils: Pupils are equal, round, and reactive to light.  Neck:     Thyroid: No thyroid mass.     Trachea: No tracheal deviation.  Cardiovascular:     Rate and Rhythm: Normal rate and regular rhythm.     Heart sounds: Normal heart sounds. No murmur heard.   No gallop.  Pulmonary:     Effort: Pulmonary effort is normal. No respiratory distress.     Breath sounds: Normal breath sounds.  No stridor. No decreased breath sounds, wheezing, rhonchi or rales.  Abdominal:     General: There is no distension.     Palpations: Abdomen is soft.     Tenderness: There is no abdominal tenderness. There is no rebound.  Musculoskeletal:        General: No tenderness. Normal range of  motion.     Cervical back: Normal range of motion and neck supple.     Comments: Tender to palpation at left sciatic notch extending down her leg.  No edema noted to patient's leg.  Neurovascular intact at left foot.  Patellar reflexes 2+  Skin:    General: Skin is warm and dry.     Findings: No abrasion or rash.  Neurological:     Mental Status: She is alert and oriented to person, place, and time. Mental status is at baseline.     GCS: GCS eye subscore is 4. GCS verbal subscore is 5. GCS motor subscore is 6.     Cranial Nerves: Cranial nerves are intact. No cranial nerve deficit.     Sensory: No sensory deficit.     Motor: Motor function is intact.  Psychiatric:        Attention and Perception: Attention normal.        Speech: Speech normal.        Behavior: Behavior normal.    ED Results / Procedures / Treatments   Labs (all labs ordered are listed, but only abnormal results are displayed) Labs Reviewed  BASIC METABOLIC PANEL - Abnormal; Notable for the following components:      Result Value   Sodium 130 (*)    Potassium 3.0 (*)    Chloride 93 (*)    Glucose, Bld 152 (*)    All other components within normal limits  CBC - Abnormal; Notable for the following components:   Hemoglobin 11.1 (*)    HCT 34.9 (*)    RDW 18.6 (*)    All other components within normal limits  HEPATIC FUNCTION PANEL - Abnormal; Notable for the following components:   AST 193 (*)    ALT 47 (*)    Bilirubin, Direct 0.4 (*)    All other components within normal limits  LIPASE, BLOOD - Abnormal; Notable for the following components:   Lipase 90 (*)    All other components within normal limits  ETHANOL  URINALYSIS,  ROUTINE W REFLEX MICROSCOPIC  I-STAT BETA HCG BLOOD, ED ([facility], [facility], [facility] ONLY)  TROPONIN I (HIGH SENSITIVITY)  TROPONIN I (HIGH SENSITIVITY)    EKG EKG Interpretation  Date/Time:  Thursday October 11 2020 08:49:37 EDT Ventricular Rate:  102 PR Interval:  138 QRS Duration: 90 QT Interval:  370 QTC Calculation: 482 R Axis:   41 Text Interpretation: Sinus tachycardia Consider anterior infarct Baseline wander in lead(s) V2 Confirmed by Lorre Nick ([id number]) on 10/11/2020 10:39:48 AM  Radiology DG Chest 2 View  Result Date: 10/11/2020 CLINICAL DATA:  Chest pain EXAM: CHEST - 2 VIEW COMPARISON:  05/19/2020 FINDINGS: The cardiac silhouette, mediastinal and hilar contours or within limits. The lungs are clear. No pleural effusions or pulmonary lesions. The bony thorax is intact. IMPRESSION: No acute cardiopulmonary findings. Electronically Signed   By: Rudie Meyer M.D.   On: 10/11/2020 09:37    Procedures Procedures   Medications Ordered in ED Medications  LORazepam (ATIVAN) injection 0-4 mg ( Intravenous See Alternative 10/11/20 0945)    Or  LORazepam (ATIVAN) tablet 0-4 mg (1 mg Oral Given 10/11/20 0945)  LORazepam (ATIVAN) injection 0-4 mg (has no administration in time range)    Or  LORazepam (ATIVAN) tablet 0-4 mg (has no administration in time range)  thiamine tablet 100 mg (100 mg Oral Given 10/11/20 0943)    Or  thiamine (B-1) injection 100 mg ( Intravenous See Alternative 10/11/20 0943)  LORazepam (ATIVAN) tablet 1 mg (1 mg Oral Given 10/11/20 0923)    ED Course  I have reviewed the triage vital signs and the nursing notes.  Pertinent labs & imaging results that were available during my care of the patient were reviewed by me and considered in my medical decision making (see chart for details).    MDM Rules/Calculators/A&P                           Patient given IV fluids here and Ativan.  Has slight withdrawal symptoms.  Mild hypokalemia treated with oral potassium.   Patient's CIWA score is 1.  Doppler of lower extremities negative for DVT.  Subsequent chest CT also negative.  Heart rate improved with fluids and treatment.  Will discharge home Final Clinical Impression(s) / ED Diagnoses Final diagnoses:  None    Rx / DC Orders ED Discharge Orders     None        Lorre Nick, MD 10/11/20 1505

## 2020-12-14 ENCOUNTER — Emergency Department (HOSPITAL_COMMUNITY): Payer: BC Managed Care – PPO

## 2020-12-14 ENCOUNTER — Other Ambulatory Visit: Payer: Self-pay

## 2020-12-14 ENCOUNTER — Inpatient Hospital Stay (HOSPITAL_COMMUNITY)
Admission: EM | Admit: 2020-12-14 | Discharge: 2020-12-20 | DRG: 897 | Disposition: A | Discharge status: Home Health | Payer: BC Managed Care – PPO | Attending: Internal Medicine | Admitting: Internal Medicine

## 2020-12-14 ENCOUNTER — Inpatient Hospital Stay (HOSPITAL_COMMUNITY): Payer: BC Managed Care – PPO

## 2020-12-14 DIAGNOSIS — E860 Dehydration: Secondary | ICD-10-CM | POA: Diagnosis present

## 2020-12-14 DIAGNOSIS — Z8249 Family history of ischemic heart disease and other diseases of the circulatory system: Secondary | ICD-10-CM

## 2020-12-14 DIAGNOSIS — E538 Deficiency of other specified B group vitamins: Secondary | ICD-10-CM

## 2020-12-14 DIAGNOSIS — Y902 Blood alcohol level of 40-59 mg/100 ml: Secondary | ICD-10-CM | POA: Diagnosis present

## 2020-12-14 DIAGNOSIS — Z7141 Alcohol abuse counseling and surveillance of alcoholic: Secondary | ICD-10-CM

## 2020-12-14 DIAGNOSIS — D6959 Other secondary thrombocytopenia: Secondary | ICD-10-CM | POA: Diagnosis present

## 2020-12-14 DIAGNOSIS — R748 Abnormal levels of other serum enzymes: Secondary | ICD-10-CM

## 2020-12-14 DIAGNOSIS — Z9181 History of falling: Secondary | ICD-10-CM

## 2020-12-14 DIAGNOSIS — G8929 Other chronic pain: Secondary | ICD-10-CM | POA: Diagnosis present

## 2020-12-14 DIAGNOSIS — I10 Essential (primary) hypertension: Secondary | ICD-10-CM

## 2020-12-14 DIAGNOSIS — E7220 Disorder of urea cycle metabolism, unspecified: Secondary | ICD-10-CM | POA: Diagnosis present

## 2020-12-14 DIAGNOSIS — E876 Hypokalemia: Secondary | ICD-10-CM

## 2020-12-14 DIAGNOSIS — R202 Paresthesia of skin: Secondary | ICD-10-CM

## 2020-12-14 DIAGNOSIS — Z22322 Carrier or suspected carrier of Methicillin resistant Staphylococcus aureus: Secondary | ICD-10-CM

## 2020-12-14 DIAGNOSIS — R296 Repeated falls: Secondary | ICD-10-CM

## 2020-12-14 DIAGNOSIS — R109 Unspecified abdominal pain: Secondary | ICD-10-CM | POA: Diagnosis not present

## 2020-12-14 DIAGNOSIS — F10239 Alcohol dependence with withdrawal, unspecified: Principal | ICD-10-CM | POA: Diagnosis present

## 2020-12-14 DIAGNOSIS — D61818 Other pancytopenia: Secondary | ICD-10-CM | POA: Diagnosis present

## 2020-12-14 DIAGNOSIS — E8729 Other acidosis: Secondary | ICD-10-CM | POA: Diagnosis not present

## 2020-12-14 DIAGNOSIS — I5032 Chronic diastolic (congestive) heart failure: Secondary | ICD-10-CM | POA: Diagnosis present

## 2020-12-14 DIAGNOSIS — M79609 Pain in unspecified limb: Secondary | ICD-10-CM

## 2020-12-14 DIAGNOSIS — F418 Other specified anxiety disorders: Secondary | ICD-10-CM | POA: Diagnosis present

## 2020-12-14 DIAGNOSIS — R55 Syncope and collapse: Secondary | ICD-10-CM

## 2020-12-14 DIAGNOSIS — W19XXXA Unspecified fall, initial encounter: Secondary | ICD-10-CM

## 2020-12-14 DIAGNOSIS — I5031 Acute diastolic (congestive) heart failure: Secondary | ICD-10-CM

## 2020-12-14 DIAGNOSIS — R102 Pelvic and perineal pain: Secondary | ICD-10-CM | POA: Diagnosis not present

## 2020-12-14 DIAGNOSIS — E872 Acidosis, unspecified: Secondary | ICD-10-CM | POA: Diagnosis present

## 2020-12-14 DIAGNOSIS — I429 Cardiomyopathy, unspecified: Secondary | ICD-10-CM | POA: Diagnosis present

## 2020-12-14 DIAGNOSIS — I110 Hypertensive heart disease with heart failure: Secondary | ICD-10-CM | POA: Diagnosis present

## 2020-12-14 DIAGNOSIS — Y92009 Unspecified place in unspecified non-institutional (private) residence as the place of occurrence of the external cause: Secondary | ICD-10-CM

## 2020-12-14 DIAGNOSIS — R509 Fever, unspecified: Secondary | ICD-10-CM

## 2020-12-14 DIAGNOSIS — Z86718 Personal history of other venous thrombosis and embolism: Secondary | ICD-10-CM

## 2020-12-14 DIAGNOSIS — F10939 Alcohol use, unspecified with withdrawal, unspecified: Secondary | ICD-10-CM | POA: Diagnosis not present

## 2020-12-14 DIAGNOSIS — K219 Gastro-esophageal reflux disease without esophagitis: Secondary | ICD-10-CM | POA: Diagnosis present

## 2020-12-14 DIAGNOSIS — K580 Irritable bowel syndrome with diarrhea: Secondary | ICD-10-CM | POA: Diagnosis not present

## 2020-12-14 DIAGNOSIS — Z79891 Long term (current) use of opiate analgesic: Secondary | ICD-10-CM

## 2020-12-14 DIAGNOSIS — K7010 Alcoholic hepatitis without ascites: Secondary | ICD-10-CM | POA: Diagnosis not present

## 2020-12-14 DIAGNOSIS — M47812 Spondylosis without myelopathy or radiculopathy, cervical region: Secondary | ICD-10-CM | POA: Diagnosis not present

## 2020-12-14 DIAGNOSIS — F10932 Alcohol use, unspecified with withdrawal with perceptual disturbance: Secondary | ICD-10-CM | POA: Diagnosis not present

## 2020-12-14 DIAGNOSIS — R0789 Other chest pain: Secondary | ICD-10-CM | POA: Diagnosis not present

## 2020-12-14 DIAGNOSIS — M25562 Pain in left knee: Secondary | ICD-10-CM | POA: Diagnosis not present

## 2020-12-14 DIAGNOSIS — R079 Chest pain, unspecified: Secondary | ICD-10-CM | POA: Diagnosis not present

## 2020-12-14 DIAGNOSIS — J301 Allergic rhinitis due to pollen: Secondary | ICD-10-CM | POA: Diagnosis not present

## 2020-12-14 DIAGNOSIS — K760 Fatty (change of) liver, not elsewhere classified: Secondary | ICD-10-CM | POA: Diagnosis not present

## 2020-12-14 DIAGNOSIS — K819 Cholecystitis, unspecified: Secondary | ICD-10-CM | POA: Diagnosis not present

## 2020-12-14 DIAGNOSIS — S0990XA Unspecified injury of head, initial encounter: Secondary | ICD-10-CM | POA: Diagnosis not present

## 2020-12-14 DIAGNOSIS — I639 Cerebral infarction, unspecified: Secondary | ICD-10-CM | POA: Diagnosis not present

## 2020-12-14 DIAGNOSIS — Z79899 Other long term (current) drug therapy: Secondary | ICD-10-CM

## 2020-12-14 DIAGNOSIS — Z86711 Personal history of pulmonary embolism: Secondary | ICD-10-CM

## 2020-12-14 DIAGNOSIS — M25561 Pain in right knee: Secondary | ICD-10-CM | POA: Diagnosis not present

## 2020-12-14 LAB — CBC
HCT: 33.1 % — ABNORMAL LOW (ref 36.0–46.0)
Hemoglobin: 10.6 g/dL — ABNORMAL LOW (ref 12.0–15.0)
MCH: 30.5 pg (ref 26.0–34.0)
MCHC: 32 g/dL (ref 30.0–36.0)
MCV: 95.1 fL (ref 80.0–100.0)
Platelets: 164 10*3/uL (ref 150–400)
RBC: 3.48 MIL/uL — ABNORMAL LOW (ref 3.87–5.11)
RDW: 23.5 % — ABNORMAL HIGH (ref 11.5–15.5)
WBC: 4.7 10*3/uL (ref 4.0–10.5)
nRBC: 0.6 % — ABNORMAL HIGH (ref 0.0–0.2)

## 2020-12-14 LAB — I-STAT BETA HCG BLOOD, ED (MC, WL, AP ONLY): I-stat hCG, quantitative: <5 m[IU]/mL (ref <5)

## 2020-12-14 LAB — COMPREHENSIVE METABOLIC PANEL
ALT: 34 U/L (ref 0–44)
AST: 217 U/L — ABNORMAL HIGH (ref 15–41)
Albumin: 3.8 g/dL (ref 3.5–5.0)
Alkaline Phosphatase: 81 U/L (ref 38–126)
Anion gap: 24 — ABNORMAL HIGH (ref 5–15)
BUN: 7 mg/dL (ref 6–20)
CO2: 17 mmol/L — ABNORMAL LOW (ref 22–32)
Calcium: 8.4 mg/dL — ABNORMAL LOW (ref 8.9–10.3)
Chloride: 95 mmol/L — ABNORMAL LOW (ref 98–111)
Creatinine, Ser: 0.72 mg/dL (ref 0.44–1.00)
GFR, Estimated: >60 mL/min (ref >60)
Glucose, Bld: 81 mg/dL (ref 70–99)
Potassium: 2.9 mmol/L — ABNORMAL LOW (ref 3.5–5.1)
Sodium: 136 mmol/L (ref 135–145)
Total Bilirubin: 2.7 mg/dL — ABNORMAL HIGH (ref 0.3–1.2)
Total Protein: 7.2 g/dL (ref 6.5–8.1)

## 2020-12-14 LAB — LACTIC ACID, PLASMA
Lactic Acid, Venous: 3.9 mmol/L (ref 0.5–1.9)
Lactic Acid, Venous: 1.6 mmol/L (ref 0.5–1.9)

## 2020-12-14 LAB — ETHANOL: Alcohol, Ethyl (B): 44 mg/dL — ABNORMAL HIGH (ref <10)

## 2020-12-14 LAB — AMMONIA: Ammonia: 61 umol/L — ABNORMAL HIGH (ref 9–35)

## 2020-12-14 LAB — D-DIMER, QUANTITATIVE: D-Dimer, Quant: 0.42 ug/mL-FEU (ref 0.00–0.50)

## 2020-12-14 LAB — TROPONIN I (HIGH SENSITIVITY)
Troponin I (High Sensitivity): 9 ng/L (ref <18)
Troponin I (High Sensitivity): 9 ng/L (ref <18)

## 2020-12-14 LAB — LIPASE, BLOOD: Lipase: 129 U/L — ABNORMAL HIGH (ref 11–51)

## 2020-12-14 LAB — VITAMIN B12: Vitamin B-12: 362 pg/mL (ref 180–914)

## 2020-12-14 LAB — MAGNESIUM: Magnesium: 1.3 mg/dL — ABNORMAL LOW (ref 1.7–2.4)

## 2020-12-14 LAB — FOLATE: Folate: 1.9 ng/mL — ABNORMAL LOW (ref >5.9)

## 2020-12-14 LAB — PROTIME-INR
INR: 1.1 (ref 0.8–1.2)
Prothrombin Time: 14.3 seconds (ref 11.4–15.2)

## 2020-12-14 MED ORDER — THIAMINE HCL 100 MG/ML IJ SOLN: 100.0000 mg | Freq: Every day | INTRAMUSCULAR | Status: DC

## 2020-12-14 MED ORDER — THIAMINE HCL 100 MG/ML IJ SOLN
300.0000 mg | Freq: Every day | INTRAVENOUS | Status: DC
  Administered 2020-12-14: 19:00:00 300 mg via INTRAVENOUS
  Filled 2020-12-14 (×2): qty 3

## 2020-12-14 MED ORDER — SODIUM CHLORIDE 0.9 % IV SOLN: INTRAVENOUS | Status: DC

## 2020-12-14 MED ORDER — LORAZEPAM 1 MG PO TABS
0.0000 mg | ORAL_TABLET | Freq: Four times a day (QID) | ORAL | Status: DC
  Administered 2020-12-15: 2 mg via ORAL
  Filled 2020-12-14: qty 2

## 2020-12-14 MED ORDER — IBUPROFEN 400 MG PO TABS
400.0000 mg | ORAL_TABLET | Freq: Once | ORAL | Status: AC
  Administered 2020-12-14: 400 mg via ORAL
  Filled 2020-12-14: qty 1

## 2020-12-14 MED ORDER — LORAZEPAM 2 MG/ML IJ SOLN
0.0000 mg | Freq: Four times a day (QID) | INTRAMUSCULAR | Status: DC
  Administered 2020-12-14 (×2): 2 mg via INTRAVENOUS
  Filled 2020-12-14 (×3): qty 1

## 2020-12-14 MED ORDER — ONDANSETRON HCL 4 MG/2ML IJ SOLN
4.0000 mg | Freq: Once | INTRAMUSCULAR | Status: AC
  Administered 2020-12-14: 4 mg via INTRAVENOUS
  Filled 2020-12-14: qty 2

## 2020-12-14 MED ORDER — LORAZEPAM 2 MG/ML IJ SOLN
0.0000 mg | Freq: Two times a day (BID) | INTRAMUSCULAR | Status: DC
  Administered 2020-12-15: 2 mg via INTRAVENOUS

## 2020-12-14 MED ORDER — FENTANYL CITRATE PF 50 MCG/ML IJ SOSY
50.0000 ug | PREFILLED_SYRINGE | Freq: Once | INTRAMUSCULAR | Status: AC
  Administered 2020-12-14: 50 ug via INTRAVENOUS
  Filled 2020-12-14: qty 1

## 2020-12-14 MED ORDER — LACTULOSE 10 GM/15ML PO SOLN
20.0000 g | Freq: Three times a day (TID) | ORAL | Status: DC
  Administered 2020-12-14 – 2020-12-15 (×3): 20 g via ORAL
  Filled 2020-12-14 (×3): qty 30

## 2020-12-14 MED ORDER — POTASSIUM CHLORIDE CRYS ER 20 MEQ PO TBCR
40.0000 meq | EXTENDED_RELEASE_TABLET | Freq: Once | ORAL | Status: AC
  Administered 2020-12-14: 40 meq via ORAL
  Filled 2020-12-14: qty 2

## 2020-12-14 MED ORDER — FOLIC ACID 1 MG PO TABS
1.0000 mg | ORAL_TABLET | Freq: Every day | ORAL | Status: DC
  Administered 2020-12-15 – 2020-12-20 (×6): 1 mg via ORAL
  Filled 2020-12-14 (×6): qty 1

## 2020-12-14 MED ORDER — THIAMINE HCL 100 MG PO TABS
100.0000 mg | ORAL_TABLET | Freq: Every day | ORAL | Status: DC
Start: 2020-12-14 — End: 2020-12-14

## 2020-12-14 MED ORDER — LORAZEPAM 1 MG PO TABS
0.0000 mg | ORAL_TABLET | Freq: Two times a day (BID) | ORAL | Status: DC
Start: 2020-12-17 — End: 2020-12-15

## 2020-12-14 MED ORDER — THIAMINE HCL 100 MG/ML IJ SOLN
200.0000 mg | Freq: Once | INTRAMUSCULAR | Status: AC
  Administered 2020-12-14: 200 mg via INTRAVENOUS
  Filled 2020-12-14: qty 2

## 2020-12-14 MED ORDER — MAGNESIUM SULFATE 2 GM/50ML IV SOLN
2.0000 g | Freq: Once | INTRAVENOUS | Status: AC
  Administered 2020-12-14: 2 g via INTRAVENOUS
  Filled 2020-12-14: qty 50

## 2020-12-14 MED ORDER — POTASSIUM CHLORIDE 10 MEQ/100ML IV SOLN
10.0000 meq | INTRAVENOUS | Status: AC
  Administered 2020-12-14 (×2): 10 meq via INTRAVENOUS
  Filled 2020-12-14 (×2): qty 100

## 2020-12-14 MED ORDER — SODIUM CHLORIDE 0.9 % IV BOLUS
2000.0000 mL | Freq: Once | INTRAVENOUS | Status: AC
  Administered 2020-12-14: 2000 mL via INTRAVENOUS

## 2020-12-14 MED ORDER — MAGNESIUM SULFATE 2 GM/50ML IV SOLN
2.0000 g | INTRAVENOUS | Status: AC
  Administered 2020-12-14: 2 g via INTRAVENOUS
  Filled 2020-12-14: qty 50

## 2020-12-14 MED ORDER — KETOROLAC TROMETHAMINE 15 MG/ML IJ SOLN
15.0000 mg | Freq: Four times a day (QID) | INTRAMUSCULAR | Status: DC | PRN
  Administered 2020-12-14 – 2020-12-18 (×8): 15 mg via INTRAVENOUS
  Filled 2020-12-14 (×8): qty 1

## 2020-12-14 NOTE — ED Notes (Signed)
Notified provider of pt's critical lactic acid level 3.9

## 2020-12-14 NOTE — ED Notes (Signed)
Patient transported to x-ray. ?

## 2020-12-14 NOTE — ED Provider Notes (Signed)
[facility] EMERGENCY DEPARTMENT Provider Note   CSN: [id number] Arrival date & time: 12/14/20  1432     History Chief Complaint  Patient presents with   Chest Pain   Fall    Nicole Hughes is a 50 y.o. female who presents with paresthesia and cp. 6 weeks ago, pt began having LLE weakness and numbness, tingling. 4 weeks ago the right leg began to feel similarly. She has had  8 falls in the past 6 weeks, fell yesterday. She did hit her head yesterday and lost consciousness.  She is using a walker at home. She sometimes feel light headed or dizzy prior to the fall. She denies palpitations. She states that at other times her legs just give out. She has has some intermittent headaches but denies other neuro-sxs. She began having nausea 1 weeks ago, decreased appetite and has had a few episodes of BRPR and diarrhea. She has not been eating and has to take 2 -3 shots every couple of hours to stave off the shakes.  She also c/o chest pain on the left radiates tot the back, sharp, constant, radiates to the left arm . She feels sob and has had wheezing. This is new. She has had some chills and sweats. Denies fever, cp is worse when she feels sob. She has associated epigastric abd pain. Pain and sob are worse with exertion and better w rest. She has not urinated since yesterday and has taken none of her medications this week.  The history is provided by the patient. No language interpreter was used.  Chest Pain Fall Associated symptoms include chest pain.      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   GERD (gastroesophageal reflux disease)    Hay fever    "fall and spring" (02/17/2017)    Headache    "2-3 times/.wik" (02/17/2017)   History of chicken pox    Hypertension    Migraine    "monthly" (02/17/2017)   Pelvic pain    Pulmonary embolism (HCC) 08/1998   S/P childbirth    Patient Active Problem List   Diagnosis Date Noted   Alcohol withdrawal (HCC) 07/08/2020   Prolonged QT interval 05/19/2020   Hypomagnesemia 12/24/2019   Dysphagia 05/31/2019   Anemia 03/31/2018   Appetite loss 03/08/2018   Myofascial pain 03/08/2018   IBS (irritable bowel syndrome) 03/08/2018   Alcoholic hepatitis without ascites 03/05/2018   Elevated LFTs 03/03/2018   Achilles tendonitis 01/28/2018   Abnormal cervical Papanicolaou smear 09/29/2017   Insomnia 09/29/2017   Chest wall pain 05/25/2017   Elevated transaminase level 05/25/2017   Depression with anxiety 02/23/2017   MRSA carrier 02/23/2017   Hypokalemia 09/24/2016   Alcohol abuse 09/22/2016   H/O acute pancreatitis 09/22/2016   GERD (gastroesophageal reflux disease) 04/09/2015   Epigastric pain 01/17/2014   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     Family History  Problem Relation Age of Onset   Cancer - Lung Mother    Heart failure Father    Hypertension Father     Social History   Tobacco Use   Smoking status: Never   Smokeless tobacco: Never  Vaping Use   Vaping Use: Never used  Substance Use Topics   Alcohol  use: Yes    Alcohol/week: 5.0 standard drinks    Types: 5 Cans of beer per week    Comment: sometimes 5 a day   Drug use: Yes    Frequency: 1.0 times per week    Types: Marijuana    Comment: Gives you chest pain - trying to quit    Home Medications Prior to Admission medications   Medication Sig Start Date End Date Taking? Authorizing Provider  acetaminophen (TYLENOL) 325 MG tablet Take 650 mg by mouth every 6 (six) hours as needed for mild pain, fever or headache.    [provider]  amLODipine (NORVASC) 10 MG tablet Take 1 tablet (10 mg total) by mouth daily. 09/05/20   Tower, Audrie Gallus, MD  metoprolol succinate (TOPROL-XL) 100 MG 24 hr tablet Take 1 tablet (100 mg total) by mouth daily. Take with or immediately following a meal. 09/05/20 12/04/20  Tower, Audrie Gallus, MD  Multiple Vitamin (MULTIVITAMIN WITH MINERALS) TABS tablet Take 1 tablet by mouth daily. 12/26/19   Alwyn Ren, MD  omeprazole (PRILOSEC) 20 MG capsule Take 20 mg by mouth daily.    [provider]  pantoprazole (PROTONIX) 40 MG tablet Take 1 tablet (40 mg total) by mouth daily. 09/05/20   Tower, Audrie Gallus, MD  potassium chloride SA (KLOR-CON) 20 MEQ tablet Take 2 tablets (40 mEq total) by mouth 2 (two) times daily. 09/05/20   Tower, Audrie Gallus, MD  sertraline (ZOLOFT) 50 MG tablet Take 2 tablets (100 mg total) by mouth daily. 09/05/20   Tower, Audrie Gallus, MD  sucralfate (CARAFATE) 1 g tablet Take 1 tablet (1 g total) by mouth 4 (four) times daily -  with meals and at bedtime. 09/05/20   Tower, Audrie Gallus, MD  traZODone (DESYREL) 50 MG tablet Take 2 tablets (100 mg total) by mouth at bedtime as needed for sleep. 09/05/20   Tower, Audrie Gallus, MD    Allergies    Patient has no known allergies.  Review of Systems   Review of Systems  Cardiovascular:  Positive for chest pain.  Ten systems reviewed and are negative for acute change, except as noted in the HPI.   Physical Exam Updated Vital Signs BP (!) 142/91 (BP  Location: Left Arm)   Pulse 95   Temp 98.1 F (36.7 C)   Resp 20   SpO2 97%   Physical Exam Vitals and nursing note reviewed.  Constitutional:      General: She is not in acute distress.    Appearance: She is well-developed. She is not diaphoretic.  HENT:     Head: Normocephalic and atraumatic.     Right Ear: External ear normal.  Left Ear: External ear normal.     Nose: Nose normal.     Mouth/Throat:     Mouth: Mucous membranes are moist.  Eyes:     General: No scleral icterus.    Extraocular Movements: Extraocular movements intact.     Right eye: No nystagmus.     Left eye: No nystagmus.     Conjunctiva/sclera: Conjunctivae normal.     Pupils: Pupils are equal, round, and reactive to light.  Cardiovascular:     Rate and Rhythm: Normal rate and regular rhythm.     Heart sounds: Normal heart sounds. No murmur heard.   No friction rub. No gallop.  Pulmonary:     Effort: Pulmonary effort is normal. No respiratory distress.     Breath sounds: Normal breath sounds.  Abdominal:     General: Bowel sounds are normal. There is no distension.     Palpations: Abdomen is soft. There is no mass.     Tenderness: There is generalized abdominal tenderness and tenderness in the right upper quadrant, epigastric area and left upper quadrant. There is no guarding.  Musculoskeletal:     Cervical back: Normal range of motion.  Skin:    General: Skin is warm and dry.  Neurological:     Mental Status: She is alert and oriented to person, place, and time.     Comments: Tremulous BL LE weakness 4/5 with dorsi and plantar flexion 1+ BL patellar reflexes Abnormal sensation  Psychiatric:        Behavior: Behavior normal.    ED Results / Procedures / Treatments   Labs (all labs ordered are listed, but only abnormal results are displayed) Labs Reviewed  CBC  COMPREHENSIVE METABOLIC PANEL  LIPASE, BLOOD  MAGNESIUM  I-STAT BETA HCG BLOOD, ED ([facility], [facility], [facility] ONLY)  TROPONIN I (HIGH  SENSITIVITY)    EKG None  Radiology No results found.  Procedures .Critical Care Performed by: Arthor Captain, PA-C Authorized by: Arthor Captain, PA-C   Critical care provider statement:    Critical care time (minutes):  46   Critical care time was exclusive of:  Separately billable procedures and treating other patients   Critical care was necessary to treat or prevent imminent or life-threatening deterioration of the following conditions:  Metabolic crisis (Hypokalemia)   Critical care was time spent personally by me on the following activities:  Development of treatment plan with patient or surrogate, discussions with consultants, evaluation of patient's response to treatment, examination of patient, ordering and review of laboratory studies, ordering and review of radiographic studies, ordering and performing treatments and interventions, pulse oximetry, re-evaluation of patient's condition, review of old charts and interpretation of cardiac output measurements   Medications Ordered in ED Medications - No data to display  ED Course  I have reviewed the triage vital signs and the nursing notes.  Pertinent labs & imaging results that were available during my care of the patient were reviewed by me and considered in my medical decision making (see chart for details).  Clinical Course as of 12/14/20 1641  Fri Dec 14, 2020  1637 Magnesium(!): 1.3 [JD]    Clinical Course User Index [JD] Dedio, Doralee Albino   MDM Rules/Calculators/A&P                           Patient here with multiple complaints including weakness and paresthesia. Patient is concerned she may have a PE due to hx of same  and sharp cp. I have low suspicion for ACS. The differential diagnosis of paresthesias includes but is not limited [id number], diabetic neuropathy, diabetes mellitus, entrapment neuropathy, eg, carpal tunnel syndrome, tarsal tunnel syndrome, meralgia paresthetica) hypocalcemia,  multiple sclerosis, spinal cord lesion, nerve root compression, herpes zoster, transient ischemic attack, Guillain-Barr syndrome, trigeminal neuralgia, migraine, partial seizure, reflex sympathetic dystrophy, thoracic outlet syndrome, brachial plexus neuropathy. Specifically for this patient I do not believe she has Guillain-Barr given the fact that her reflexes are maintained.  I do have high concern for dry beriberi or severe B12 deficiency given her history of alcoholism.  Patient is weak.  She does not have any encephalopathy or nystagmus can suggestive of Wernicke's encephalopathy. Ordered and reviewed labs that included CBC which shows normocytic anemia however patient's MCV is on the high end.  Initial troponin within normal limits.  Has a negative pregnancy test.  Lipase elevated at 129, magnesium level low at 1.3, CMP with hypokalemia of 2.9, elevated AST to ALT and a ratio of greater than 2-1.  Patient also has an anion gap metabolic acidosis at 24 likely due to alcoholic ketoacidosis.  I have added on a lactic acid level.  Fluids initiated.   50 year old female here with weakness and chest pain. I discussed the case with Dr. Derry Lory who agrees that it is very likely that this patient has dry beriberi and needs high-dose thiamine recommends 500 mg of thiamine 3 times daily.  He also recommends additional lab orders including ammonia level and a folate level and agrees with vitamin B12 and baseline thiamine levels.  Given the patient's history of alcohol dependence he states that hyperammonemia could certainly make her tremulous and withdrawals could exacerbate any symptoms of thiamine deficiency. I ordered and reviewed images of 2 view chest x-ray which showed no acute abnormality, CT C-spine is also negative and CT head concerning for potential age-indeterminate stroke which will need MRI follow-up. Labs ordered and reviewed above.  Lactic acid is elevated.  She is receiving fluids.  Ammonia  pending.  Discussed with Dr. Thomes Dinning who will admit the patient Final Clinical Impression(s) / ED Diagnoses Final diagnoses:  Alcohol withdrawal syndrome with complication (HCC)  Paresthesia and pain of extremity  Hypomagnesemia  Hypokalemia  Metabolic acidosis, increased anion gap (IAG)    Rx / DC Orders ED Discharge Orders     None        Arthor Captain, PA-C 12/14/20 2323    Rolan Bucco, MD 12/14/20 [phone]

## 2020-12-14 NOTE — ED Notes (Signed)
Adela Lank (dau.) called asking for an update at [phone]

## 2020-12-14 NOTE — ED Triage Notes (Addendum)
Pt here for L side cp, radiates to back x2 days w/ nausea. Pt also has numbness in bilateral lower extremities and starting to progress into arms as of today. Pt has had 8 falls over last 6 weeks, most recent one was 2 days ago, pt has not been seen for any of the falls. Pt is shob, increased WOB in triage. Pt also reports she is an alcoholic but has recently cut back, trying to stop. Last drink was yesterday. Reports drinking a pint a day

## 2020-12-14 NOTE — ED Notes (Signed)
Patient transported to MRI 

## 2020-12-14 NOTE — H&P (Addendum)
History and Physical  Nicole Hughes LFY:[id number] DOB: 1970/09/24 DOA: 12/14/2020  Referring physician: Arthor Captain, PA-C  PCP: Judy Pimple, MD  Patient coming from: Home  Chief Complaint: Fall, chest pain  HPI: Nicole Hughes is a 50 y.o. female with medical history significant for alcohol abuse, anxiety/depression, chronic back pain, IBS, hypertension, remote history of provoked DVT and PE during pregnancy off anticoagulation who presents to the emergency department due to recurrent falls due to leg weakness, last fall was yesterday, where she states that she hit her head and lost consciousness.  Patient complained of left leg weakness which started about 6 weeks ago, this has since extended to right leg and she endorsed 8 falls within this time period.  She also complained of reproducible midsternal sharp chest pain and left shoulder pain which was constant and rated as 10/10 on pain scale, there was no aggravating/alleviating factors known.  Patient states that she drinks liquor daily and that she drinks 2-3 shots every couple of hours to prevent shakes.  She complained of 1 week of nausea and dry heaving.  She states that she has not been eating much in the last few days.  Patient also complaining of epigastric pain, but she denies fever.  ED Course:  In the emergency department, BP was 142/91 on arrival and other vital signs were within normal range.  Work-up in the ED showed normocytic anemia, hypokalemia, anion gap metabolic acidosis, elevated AST, hypomagnesemia, elevated lipase, T bili 2.7.  Troponin x2 was negative, D-dimer 0.42. CT head without contrast showed no acute fracture hemorrhage.  Small hypodensity left putamen compatible with infarct of indeterminate age. CT cervical spine without contrast showed cervical spondylosis with no fracture Chest x-ray showed no active cardiopulmonary disease Potassium was replenished, magnesium was replenished, thiamine was given, IV  hydration was provided.  Neurology was consulted and recommended MRI in the morning and to give patient thiamine per ED physician.  Hospitalist was asked to admit patient for further evaluation and management.  Review of Systems: Constitutional: Negative for fever and chills.  HENT: Negative for ear pain and sore throat.   Eyes: Negative for pain and visual disturbance.  Respiratory: Negative for cough, chest tightness and shortness of breath.   Cardiovascular: Positive for chest pain and negative for palpitations.  Gastrointestinal: Negative for abdominal pain and vomiting.  Endocrine: Negative for polyphagia and polyuria.  Genitourinary: Negative for decreased urine volume, dysuria, enuresis Musculoskeletal: Positive for left shoulder pain.  Negative for back pain.  Skin: Negative for color change and rash.  Allergic/Immunologic: Negative for immunocompromised state.  Neurological: Negative for tremors, syncope, speech difficulty, weakness, light-headedness and headaches.  Hematological: Does not bruise/bleed easily.  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   GERD (gastroesophageal reflux disease)    Hay fever    "fall and spring" (02/17/2017)   Headache    "2-3 times/.wik" (02/17/2017)   History of chicken pox    Hypertension    Migraine    "monthly" (02/17/2017)   Pelvic pain    Pulmonary embolism (  [facility]) 08/1998   S/P childbirth   Past Surgical History:  Procedure Laterality Date   CESAREAN SECTION  1994   ECTOPIC PREGNANCY SURGERY  2010   LAPAROSCOPY N/A 09/24/2012   Procedure: OPERATIVE LAPROSCOPY WITH LYSIS OF ADHESIONS;  Surgeon: Thornell Sartorius, MD;   Location: [facility];  Service: Gynecology;  Laterality: N/A;   TONSILLECTOMY  1984   TUBAL LIGATION  2010    Social History:  reports that she has never smoked. She has never used smokeless tobacco. She reports current alcohol use of about 5.0 standard drinks per week. She reports current drug use. Frequency: 1.00 time per week. Drug: Marijuana.   No Known Allergies  Family History  Problem Relation Age of Onset   Cancer - Lung Mother    Heart failure Father    Hypertension Father      Prior to Admission medications   Medication Sig Start Date End Date Taking? Authorizing Provider  acetaminophen (TYLENOL) 325 MG tablet Take 650 mg by mouth every 6 (six) hours as needed for mild pain, fever or headache.    [provider]  amLODipine (NORVASC) 10 MG tablet Take 1 tablet (10 mg total) by mouth daily. 09/05/20   Tower, Wynelle Fanny, MD  metoprolol succinate (TOPROL-XL) 100 MG 24 hr tablet Take 1 tablet (100 mg total) by mouth daily. Take with or immediately following a meal. 09/05/20 12/04/20  Tower, Wynelle Fanny, MD  Multiple Vitamin (MULTIVITAMIN WITH MINERALS) TABS tablet Take 1 tablet by mouth daily. 12/26/19   Georgette Shell, MD  omeprazole (PRILOSEC) 20 MG capsule Take 20 mg by mouth daily.    [provider]  pantoprazole (PROTONIX) 40 MG tablet Take 1 tablet (40 mg total) by mouth daily. 09/05/20   Tower, Wynelle Fanny, MD  potassium chloride SA (KLOR-CON) 20 MEQ tablet Take 2 tablets (40 mEq total) by mouth 2 (two) times daily. 09/05/20   Tower, Wynelle Fanny, MD  sertraline (ZOLOFT) 50 MG tablet Take 2 tablets (100 mg total) by mouth daily. 09/05/20   Tower, Wynelle Fanny, MD  sucralfate (CARAFATE) 1 g tablet Take 1 tablet (1 g total) by mouth 4 (four) times daily -  with meals and at bedtime. 09/05/20   Tower, Wynelle Fanny, MD  traZODone (DESYREL) 50 MG tablet Take 2 tablets (100 mg total) by mouth at bedtime as needed for sleep. 09/05/20   Tower, Wynelle Fanny, MD    Physical Exam: BP (!) 173/99    Pulse (!) 114 Comment: Admitting MD notified on score  Temp 98.5 F (36.9 C) (Oral)   Resp 18   SpO2 100%   General: 50 y.o. year-old female well developed well nourished in no acute distress.  Alert and oriented x3. HEENT: Dry mucous membrane.  NCAT, EOMI Neck: Supple, trachea medial Cardiovascular: Regular rate and rhythm with no rubs or gallops.  No thyromegaly or JVD noted.  No lower extremity edema. 2/4 pulses in all 4 extremities. Respiratory: Clear to auscultation with no wheezes or rales. Good inspiratory effort. Abdomen: Soft, nontender nondistended with normal bowel sounds x4 quadrants. Muskuloskeletal: Tender to palpation of midsternal area and left shoulder blade.  No cyanosis, clubbing or edema noted bilaterally Neuro: Tremors of outstretched hand.  CN II-XII intact, strength 5/5 x 4, sensation, reflexes intact Skin: No ulcerative lesions noted or rashes Psychiatry: Judgement and insight appear normal. Mood is appropriate for condition and setting          Labs on Admission:  Basic Metabolic Panel:  Recent Labs  Lab 12/14/20 1532  NA 136  K 2.9*  CL 95*  CO2 17*  GLUCOSE 81  BUN 7  CREATININE 0.72  CALCIUM 8.4*  MG 1.3*   Liver Function Tests: Recent Labs  Lab 12/14/20 1532  AST 217*  ALT 34  ALKPHOS 81  BILITOT 2.7*  PROT 7.2  ALBUMIN 3.8   Recent Labs  Lab 12/14/20 1532  LIPASE 129*   Recent Labs  Lab 12/14/20 1855  AMMONIA 61*   CBC: Recent Labs  Lab 12/14/20 1458  WBC 4.7  HGB 10.6*  HCT 33.1*  MCV 95.1  PLT 164   Cardiac Enzymes: No results for input(s): CKTOTAL, CKMB, CKMBINDEX, TROPONINI in the last 168 hours.  BNP (last 3 results) No results for input(s): BNP in the last 8760 hours.  ProBNP (last 3 results) No results for input(s): PROBNP in the last 8760 hours.  CBG: No results for input(s): GLUCAP in the last 168 hours.  Radiological Exams on Admission: DG Chest 2 View  Result Date: 12/14/2020 CLINICAL DATA:  Chest  pain. EXAM: CHEST - 2 VIEW COMPARISON:  Chest x-ray 10/11/2020. FINDINGS: The heart size and mediastinal contours are within normal limits. Both lungs are clear. The visualized skeletal structures are unremarkable. IMPRESSION: No active cardiopulmonary disease. Electronically Signed   By: Ronney Asters M.D.   On: 12/14/2020 16:13   CT Head Wo Contrast  Result Date: 12/14/2020 CLINICAL DATA:  Head trauma moderate to severe.  Multiple falls EXAM: CT HEAD WITHOUT CONTRAST CT CERVICAL SPINE WITHOUT CONTRAST TECHNIQUE: Multidetector CT imaging of the head and cervical spine was performed following the standard protocol without intravenous contrast. Multiplanar CT image reconstructions of the cervical spine were also generated. COMPARISON:  CT head 02/25/2020 FINDINGS: CT HEAD FINDINGS Brain: No evidence of acute infarction, hemorrhage, hydrocephalus, extra-axial collection or mass lesion/mass effect. Small hypodensity right internal capsule anteriorly unchanged from prior study compatible with chronic infarct. Small hypodensity left putamen not seen previously. This is most consistent with infarct of indeterminate age, likely chronic Vascular: Negative for hyperdense vessel Skull: Negative Sinuses/Orbits: Mild mucosal edema left maxillary sinus. This appears odontogenic with a periapical cyst around left upper molar. Negative orbit Other: None CT CERVICAL SPINE FINDINGS Alignment: Mild anterolisthesis C3-4 and C4-5 Skull base and vertebrae: Negative for fracture Soft tissues and spinal canal: Negative for soft tissue mass or edema Disc levels: Disc degeneration and spurring throughout the cervical spine most prominent C3 through C6. Asymmetric facet degeneration on the right. Marked right foraminal encroachment at C3-4 and C4-5. Marked left foraminal encroachment at C5-6. Upper chest: Lung apices clear bilaterally. Other: None IMPRESSION: 1. No acute intracranial hemorrhage. Small hypodensity left putamen compatible  with infarct of indeterminate age. This was not present on the prior study but may be chronic. 2. Cervical spondylosis.  Negative for fracture. Electronically Signed   By: Franchot Gallo M.D.   On: 12/14/2020 16:28   CT Cervical Spine Wo Contrast  Result Date: 12/14/2020 CLINICAL DATA:  Head trauma moderate to severe.  Multiple falls EXAM: CT HEAD WITHOUT CONTRAST CT CERVICAL SPINE WITHOUT CONTRAST TECHNIQUE: Multidetector CT imaging of the head and cervical spine was performed following the standard protocol without intravenous contrast. Multiplanar CT image reconstructions of the cervical spine were also generated. COMPARISON:  CT head 02/25/2020 FINDINGS: CT HEAD FINDINGS Brain: No evidence of acute infarction, hemorrhage, hydrocephalus, extra-axial collection or mass lesion/mass effect. Small hypodensity right internal capsule anteriorly unchanged from prior study compatible with  chronic infarct. Small hypodensity left putamen not seen previously. This is most consistent with infarct of indeterminate age, likely chronic Vascular: Negative for hyperdense vessel Skull: Negative Sinuses/Orbits: Mild mucosal edema left maxillary sinus. This appears odontogenic with a periapical cyst around left upper molar. Negative orbit Other: None CT CERVICAL SPINE FINDINGS Alignment: Mild anterolisthesis C3-4 and C4-5 Skull base and vertebrae: Negative for fracture Soft tissues and spinal canal: Negative for soft tissue mass or edema Disc levels: Disc degeneration and spurring throughout the cervical spine most prominent C3 through C6. Asymmetric facet degeneration on the right. Marked right foraminal encroachment at C3-4 and C4-5. Marked left foraminal encroachment at C5-6. Upper chest: Lung apices clear bilaterally. Other: None IMPRESSION: 1. No acute intracranial hemorrhage. Small hypodensity left putamen compatible with infarct of indeterminate age. This was not present on the prior study but may be chronic. 2. Cervical  spondylosis.  Negative for fracture. Electronically Signed   By: Franchot Gallo M.D.   On: 12/14/2020 16:28   MR BRAIN WO CONTRAST  Result Date: 12/14/2020 CLINICAL DATA:  Follow-up examination for stroke. EXAM: MRI HEAD WITHOUT CONTRAST TECHNIQUE: Multiplanar, multiecho pulse sequences of the brain and surrounding structures were obtained without intravenous contrast. COMPARISON:  CT from earlier the same day. FINDINGS: Brain: Cerebral volume within normal limits for age. Mild patchy T2/FLAIR hyperintensity involving the periventricular deep white matter both cerebral hemispheres, most consistent with chronic small vessel ischemic disease, mild in nature. No abnormal foci of restricted diffusion to suggest acute or subacute ischemia. Gray-white matter differentiation maintained. No encephalomalacia to suggest chronic cortical infarction. No acute intracranial hemorrhage. Few punctate chronic micro hemorrhages noted about the left thalamus and left caudate, within additional punctate hemorrhage at the posterior left temporal region. Findings likely related to underlying hypertension. No mass lesion, midline shift or mass effect. No hydrocephalus or extra-axial fluid collection. Pituitary gland suprasellar region normal. Vascular: Major intracranial vascular flow voids are well maintained. Skull and upper cervical spine: Cranial junction within normal limits. Bone marrow signal intensity diffusely decreased on T1 weighted sequence, nonspecific, but most commonly related to anemia, smoking or obesity. No scalp soft tissue abnormality. Sinuses/Orbits: Globes orbital soft tissues within normal limits. Paranasal sinuses and mastoid air cells are clear. Other: None. IMPRESSION: 1. No acute intracranial abnormality. 2. Mild chronic microvascular ischemic disease. Electronically Signed   By: Jeannine Boga M.D.   On: 12/14/2020 20:27    EKG: I independently viewed the EKG done and my findings are as followed:  Normal sinus rhythm at a rate of 95 bpm  Assessment/Plan Present on Admission:  Alcohol withdrawal ([place])  Hypomagnesemia  Hypokalemia  Chest wall pain  Active Problems:   Essential hypertension   Hypokalemia   Chest wall pain   Elevated lipase   Hypomagnesemia   Alcohol withdrawal (HCC)   High anion gap metabolic acidosis   Fall at home, initial encounter   Falls frequently   Syncope   Folic acid deficiency   Anion gap metabolic acidosis possibly secondary to alcoholic/starvation ketosis/dehydration Alcohol abuse/withdrawal Anion gap 24, glucose was normal at 81 and patient has no history of T2DM Continue IV hydration Continue to monitor anion gap for closure and improved bicarb Patient was counseled on alcohol abuse cessation when more stable  Lactic acidosis in setting of above Lactic acid 3.9, continue to monitor lactic acid   Elevated AST possibly due to patient's alcohol abuse Alcohol withdrawal Alcohol level was 44,.  Patient with tremors in outstretched hand Continue CIWA  protocol  Hyperammonemia Ammonia 61, lactulose will be given  Hypomagnesemia Mg level is 1.3 This will be replenished Please continue to monitor Mg level and correct accordingly  Hypokalemia K+ is 2.9 K+ will be replenished Please monitor for AM K+ for further replenishmemnt  Elevated lipase level Lipase 61, continue to monitor  Chest wall pain and left shoulder pain Patient presents with reproducible chest pain Troponin x2 was negative Continue Toradol as needed  Folic acid deficiency Continue folic acid  Reported syncopal episode Continue telemetry and watch for arrhythmias Troponins x2 was flat at 9; continue to trend troponin EKG showed normal sinus rhythm at a rate of 95 bpm Echocardiogram done in June 2019 showed LVEF of 60 to 65%.  Wall motion was normal and no RWMA.  Features were consistent with supranormal left ventricular filling pattern (grade 2 diastolic  dysfunction) Echocardiogram will be done to rule out significant aortic stenosis or other outflow obstruction, and also to evaluate EF and to rule out segmental/Regional wall motion abnormalities.  Carotid artery Dopplers will be done to rule out hemodynamically significant stenosis  Multiple falls secondary to her reported leg weakness Continue fall precaution and neurochecks Continue PT/OT eval and treat Neurology was consulted by ED physician, MRI of brain recommended showed no acute intracranial abnormality. Consider neurology follow-up for worsening symptoms despite PT/OT eval and treatment  History of diastolic CHF Stable, patient does not appear fluid overloaded  Essential hypertension Continue amlodipine and metoprolol  GERD Continue Protonix   DVT prophylaxis: Lovenox  Code Status: Full code  Family Communication: None at bedside  Disposition Plan:  Patient is from:                        home Anticipated DC to:                   SNF or family members home Anticipated DC date:               2-3 days Anticipated DC barriers:          Patient requires inpatient management due to alcohol withdrawal and anion gap metabolic acidosis secondary to alcoholic/starvation ketosis  Consults called: None  Admission status: Inpatient    Bernadette Hoit MD [facility]  12/14/2020, 10:50 PM

## 2020-12-15 ENCOUNTER — Inpatient Hospital Stay (HOSPITAL_COMMUNITY): Payer: BC Managed Care – PPO

## 2020-12-15 DIAGNOSIS — F10932 Alcohol use, unspecified with withdrawal with perceptual disturbance: Secondary | ICD-10-CM

## 2020-12-15 DIAGNOSIS — I5031 Acute diastolic (congestive) heart failure: Secondary | ICD-10-CM

## 2020-12-15 LAB — COMPREHENSIVE METABOLIC PANEL
ALT: 29 U/L (ref 0–44)
AST: 169 U/L — ABNORMAL HIGH (ref 15–41)
Albumin: 3.3 g/dL — ABNORMAL LOW (ref 3.5–5.0)
Alkaline Phosphatase: 70 U/L (ref 38–126)
Anion gap: 19 — ABNORMAL HIGH (ref 5–15)
BUN: 6 mg/dL (ref 6–20)
CO2: 14 mmol/L — ABNORMAL LOW (ref 22–32)
Calcium: 7.5 mg/dL — ABNORMAL LOW (ref 8.9–10.3)
Chloride: 103 mmol/L (ref 98–111)
Creatinine, Ser: 0.91 mg/dL (ref 0.44–1.00)
GFR, Estimated: >60 mL/min (ref >60)
Glucose, Bld: 70 mg/dL (ref 70–99)
Potassium: 3.1 mmol/L — ABNORMAL LOW (ref 3.5–5.1)
Sodium: 136 mmol/L (ref 135–145)
Total Bilirubin: 3.8 mg/dL — ABNORMAL HIGH (ref 0.3–1.2)
Total Protein: 6.1 g/dL — ABNORMAL LOW (ref 6.5–8.1)

## 2020-12-15 LAB — BASIC METABOLIC PANEL
Anion gap: 15 (ref 5–15)
BUN: <5 mg/dL — ABNORMAL LOW (ref 6–20)
CO2: 19 mmol/L — ABNORMAL LOW (ref 22–32)
Calcium: 7.7 mg/dL — ABNORMAL LOW (ref 8.9–10.3)
Chloride: 101 mmol/L (ref 98–111)
Creatinine, Ser: 0.61 mg/dL (ref 0.44–1.00)
GFR, Estimated: >60 mL/min (ref >60)
Glucose, Bld: 78 mg/dL (ref 70–99)
Potassium: 2.8 mmol/L — ABNORMAL LOW (ref 3.5–5.1)
Sodium: 135 mmol/L (ref 135–145)

## 2020-12-15 LAB — MAGNESIUM
Magnesium: 2.3 mg/dL (ref 1.7–2.4)
Magnesium: 2 mg/dL (ref 1.7–2.4)

## 2020-12-15 LAB — CBC
HCT: 25.5 % — ABNORMAL LOW (ref 36.0–46.0)
Hemoglobin: 8.4 g/dL — ABNORMAL LOW (ref 12.0–15.0)
MCH: 32.1 pg (ref 26.0–34.0)
MCHC: 32.9 g/dL (ref 30.0–36.0)
MCV: 97.3 fL (ref 80.0–100.0)
Platelets: 108 10*3/uL — ABNORMAL LOW (ref 150–400)
RBC: 2.62 MIL/uL — ABNORMAL LOW (ref 3.87–5.11)
RDW: 23.2 % — ABNORMAL HIGH (ref 11.5–15.5)
WBC: 3.6 10*3/uL — ABNORMAL LOW (ref 4.0–10.5)
nRBC: 0 % (ref 0.0–0.2)

## 2020-12-15 LAB — PHOSPHORUS: Phosphorus: 1.4 mg/dL — ABNORMAL LOW (ref 2.5–4.6)

## 2020-12-15 LAB — LACTIC ACID, PLASMA: Lactic Acid, Venous: 1 mmol/L (ref 0.5–1.9)

## 2020-12-15 MED ORDER — LORAZEPAM 2 MG/ML IJ SOLN
0.0000 mg | INTRAMUSCULAR | Status: AC
  Administered 2020-12-15 – 2020-12-17 (×10): 2 mg via INTRAVENOUS
  Filled 2020-12-15 (×10): qty 1

## 2020-12-15 MED ORDER — METOCLOPRAMIDE HCL 5 MG/ML IJ SOLN
5.0000 mg | Freq: Once | INTRAMUSCULAR | Status: AC
  Administered 2020-12-15: 5 mg via INTRAVENOUS
  Filled 2020-12-15: qty 2

## 2020-12-15 MED ORDER — PROCHLORPERAZINE EDISYLATE 10 MG/2ML IJ SOLN
5.0000 mg | Freq: Once | INTRAMUSCULAR | Status: AC
Start: 2020-12-15 — End: 2020-12-15
  Administered 2020-12-15: 5 mg via INTRAVENOUS
  Filled 2020-12-15: qty 2

## 2020-12-15 MED ORDER — LORAZEPAM 2 MG/ML IJ SOLN
1.0000 mg | INTRAMUSCULAR | Status: AC | PRN
  Administered 2020-12-15: 3 mg via INTRAVENOUS
  Administered 2020-12-15 – 2020-12-16 (×3): 2 mg via INTRAVENOUS
  Administered 2020-12-16: 1 mg via INTRAVENOUS
  Administered 2020-12-16 – 2020-12-18 (×5): 2 mg via INTRAVENOUS
  Filled 2020-12-15: qty 2
  Filled 2020-12-15 (×9): qty 1

## 2020-12-15 MED ORDER — THIAMINE HCL 100 MG PO TABS
100.0000 mg | ORAL_TABLET | Freq: Every day | ORAL | Status: DC
  Administered 2020-12-15 – 2020-12-20 (×6): 100 mg via ORAL
  Filled 2020-12-15 (×5): qty 1

## 2020-12-15 MED ORDER — OXYCODONE HCL 5 MG PO TABS
5.0000 mg | ORAL_TABLET | Freq: Once | ORAL | Status: AC
  Administered 2020-12-15: 5 mg via ORAL
  Filled 2020-12-15: qty 1

## 2020-12-15 MED ORDER — HYDRALAZINE HCL 20 MG/ML IJ SOLN: 5.0000 mg | INTRAMUSCULAR | Status: DC | PRN

## 2020-12-15 MED ORDER — AMLODIPINE BESYLATE 10 MG PO TABS
10.0000 mg | ORAL_TABLET | Freq: Every day | ORAL | Status: DC
  Administered 2020-12-15 – 2020-12-20 (×6): 10 mg via ORAL
  Filled 2020-12-15 (×6): qty 1

## 2020-12-15 MED ORDER — POTASSIUM CHLORIDE 10 MEQ/100ML IV SOLN
10.0000 meq | INTRAVENOUS | Status: AC
  Administered 2020-12-15 (×3): 10 meq via INTRAVENOUS
  Filled 2020-12-15 (×3): qty 100

## 2020-12-15 MED ORDER — ADULT MULTIVITAMIN W/MINERALS CH
1.0000 | ORAL_TABLET | Freq: Every day | ORAL | Status: DC
  Administered 2020-12-15 – 2020-12-20 (×6): 1 via ORAL
  Filled 2020-12-15 (×6): qty 1

## 2020-12-15 MED ORDER — THIAMINE HCL 100 MG/ML IJ SOLN
100.0000 mg | Freq: Every day | INTRAMUSCULAR | Status: DC
  Filled 2020-12-15: qty 2

## 2020-12-15 MED ORDER — FOLIC ACID 1 MG PO TABS: 1.0000 mg | ORAL_TABLET | Freq: Every day | ORAL | Status: DC

## 2020-12-15 MED ORDER — ENOXAPARIN SODIUM 40 MG/0.4ML IJ SOSY
40.0000 mg | PREFILLED_SYRINGE | INTRAMUSCULAR | Status: DC
  Administered 2020-12-15 – 2020-12-19 (×4): 40 mg via SUBCUTANEOUS
  Filled 2020-12-15 (×4): qty 0.4

## 2020-12-15 MED ORDER — PANTOPRAZOLE SODIUM 40 MG PO TBEC
40.0000 mg | DELAYED_RELEASE_TABLET | Freq: Every day | ORAL | Status: DC
  Administered 2020-12-15 – 2020-12-17 (×3): 40 mg via ORAL
  Filled 2020-12-15 (×3): qty 1

## 2020-12-15 MED ORDER — LORAZEPAM 1 MG PO TABS
1.0000 mg | ORAL_TABLET | ORAL | Status: AC | PRN
  Administered 2020-12-15: 2 mg via ORAL
  Filled 2020-12-15: qty 2

## 2020-12-15 MED ORDER — IBUPROFEN 400 MG PO TABS
400.0000 mg | ORAL_TABLET | Freq: Once | ORAL | Status: AC
  Administered 2020-12-15: 400 mg via ORAL
  Filled 2020-12-15: qty 1

## 2020-12-15 MED ORDER — ONDANSETRON HCL 4 MG/2ML IJ SOLN
4.0000 mg | Freq: Once | INTRAMUSCULAR | Status: AC
  Administered 2020-12-15: 4 mg via INTRAVENOUS
  Filled 2020-12-15: qty 2

## 2020-12-15 MED ORDER — POTASSIUM PHOSPHATES 15 MMOLE/5ML IV SOLN
15.0000 mmol | Freq: Once | INTRAVENOUS | Status: AC
  Administered 2020-12-15: 15 mmol via INTRAVENOUS
  Filled 2020-12-15: qty 5

## 2020-12-15 MED ORDER — METOPROLOL SUCCINATE ER 100 MG PO TB24
100.0000 mg | ORAL_TABLET | Freq: Every day | ORAL | Status: DC
  Administered 2020-12-15 – 2020-12-20 (×6): 100 mg via ORAL
  Filled 2020-12-15 (×6): qty 1

## 2020-12-15 MED ORDER — LORAZEPAM 2 MG/ML IJ SOLN
0.0000 mg | Freq: Three times a day (TID) | INTRAMUSCULAR | Status: AC
  Administered 2020-12-17 (×2): 2 mg via INTRAVENOUS
  Administered 2020-12-18 (×2): 3 mg via INTRAVENOUS
  Filled 2020-12-15: qty 2
  Filled 2020-12-15: qty 1
  Filled 2020-12-15: qty 2
  Filled 2020-12-15: qty 1

## 2020-12-15 NOTE — Progress Notes (Addendum)
PROGRESS NOTE    CONCHETTA LAMIA  WLS:[id number] DOB: 14-Apr-1970 DOA: 12/14/2020 PCP: Judy Pimple, MD    Brief Narrative:  Nicole Hughes is a 50 y.o. female with medical history significant for alcohol abuse, anxiety/depression, chronic back pain, IBS, hypertension, remote history of provoked DVT and PE during pregnancy off anticoagulation who presents to the emergency department due to recurrent falls due to leg weakness, last fall was yesterday, where she states that she hit her head and lost consciousness.  Patient complained of left leg weakness which started about 6 weeks ago, this has since extended to right leg and she endorsed 8 falls within this time period.  She also complained of reproducible midsternal sharp chest pain and left shoulder pain which was constant and rated as 10/10 on pain scale, there was no aggravating/alleviating factors known.  Patient states that she drinks liquor daily and that she drinks 2-3 shots every couple of hours to prevent shakes.  She complained of 1 week of nausea and dry heaving.  She states that she has not been eating much in the last few days.  Patient also complaining of epigastric pain, but she denies fever   Assessment & Plan:  Head injury status post fall questionable syncopal episode and secondary to leg weakness Status post fall hit her head CT head and CT C-spine IMPRESSION: 1. No acute intracranial hemorrhage. Small hypodensity left putamen compatible with infarct of indeterminate age. This was not present on the prior study but may be chronic. 2. Cervical spondylosis.  Negative for fracture MRI1. No acute intracranial abnormality. 2. Mild chronic microvascular ischemic disease Plan neurochecks, neurology consult  Chronic alcohol abuse Alcohol level 44 Plan withdrawal protocol Ativan IV when can take p.o. switch to p.o.  Electrolyte imbalance Hypokalemia hypomagnesemia hypophosphatemia Secondary to alcohol starvation ketosis  dehydration Plan replace electrolytes per electrolyte replacement protocol  Metabolic acidosis with lactic acidosis with anion gap Secondary to alcoholic starvation ketosis dehydration Plan IV hydration follow the gap and lactic acid  Elevated ammonia level Rule out hepatic encephalopathy Was given lactulose repeat ammonia level in the morning Will check ultrasound right upper quadrant  Need to be careful with Ativan with elevated ammonia level   Elevated GOT- double then ALT And bilirubin Secondary to alcohol abuse   Mild pancytopenia Rule out secondary to hypersplenism Right  upper quadrant ultrasound Old ultrasound fatty liver rule out cirrhosis  Hemoglobin dropped from 10.3-8.6 Check H&H Protonix iv   Chest pain Noncardiac Reproductive chest pain troponin x2 negative Toradol as needed  Folic acid deficiency Continue folic acid   Reported syncopal episode Continue telemetry and watch for arrhythmias Troponins x2 was flat at 9; continue to trend troponin EKG showed normal sinus rhythm at a rate of 95 bpm Echocardiogram done in June 2019 showed LVEF of 60 to 65%.  Wall motion was normal and no RWMA.  Features were consistent with supranormal left ventricular filling pattern (grade 2 diastolic dysfunction) Echocardiogram will be done to rule out significant aortic stenosis or other outflow obstruction, and also to evaluate EF and to rule out segmental/Regional wall motion abnormalities.  Carotid artery Dopplers will be done to rule out hemodynamically significant stenosis   Multiple falls secondary to her reported leg weakness Continue fall precaution and neurochecks Continue PT/OT eval and treat Neurology was consulted by ED physician, MRI of brain recommended showed no acute intracranial abnormality. Consider neurology follow-up for worsening symptoms despite PT/OT eval and treatment   History of diastolic  CHF Stable, patient does not appear fluid  overloaded  Essential hypertension Continue amlodipine and metoprolol   GERD Continue Protonix, Carafate      Active Problems:   Essential hypertension   Hypokalemia   Chest wall pain   Elevated lipase   Hypomagnesemia   Alcohol withdrawal (HCC)   High anion gap metabolic acidosis   Fall at home, initial encounter   Falls frequently   Syncope   Folic acid deficiency   Acute diastolic CHF (congestive heart failure) (HCC)      DVT prophylaxis: (Lovenox/we will check platelets daily Code Status: Full code Family Communication: No family in the room Disposition Plan: 1 or 2 days   Consultants:  None     Subjective: Patient agitated sleepy during my examination after receiving Ativan  Objective: Vitals:   12/15/20 1009 12/15/20 1115 12/15/20 1116 12/15/20 1200  BP: (!) 166/86 (!) 152/88 (!) 152/88   Pulse: (!) 116 (!) 113 (!) 107 (!) 106  Resp: Temp: 98.6 F (37 C)  98.4 F (36.9 C)   TempSrc: Oral  Oral   SpO2: 99% 99% 100%     Intake/Output Summary (Last 24 hours) at 12/15/2020 1507 Last data filed at 12/15/2020 1300 Gross per 24 hour  Intake 2450 ml  Output 2000 ml  Net 450 ml   There were no vitals filed for this visit.  Examination:  General exam: Appears calm and comfortable  Respiratory system: Clear to auscultation. Respiratory effort normal. Cardiovascular system: S1 & S2 heard, RRR. No JVD, murmurs, rubs, gallops or clicks. No pedal edema. Gastrointestinal system: Abdomen is nondistended, soft and nontender. No organomegaly or masses felt. Normal bowel sounds heard. Central nervous system: Alert and oriented. No focal neurological deficits. Extremities: Symmetric 5 x 5 power. Skin: No rashes, lesions or ulcers Psychiatry: Judgement and insight appear normal. Mood & affect appropriate.     Data Reviewed: I have personally reviewed following labs and imaging studies  CBC: Recent Labs  Lab 12/14/20 1458 12/15/20 0618   WBC 4.7 3.6*  HGB 10.6* 8.4*  HCT 33.1* 25.5*  MCV 95.1 97.3  PLT 164 108*   Basic Metabolic Panel: Recent Labs  Lab 12/14/20 1532 12/15/20 0235 12/15/20 0618  NA 136  --  136  K 2.9*  --  3.1*  CL 95*  --  103  CO2 17*  --  14*  GLUCOSE 81  --  70  BUN 7  --  6  CREATININE 0.72  --  0.91  CALCIUM 8.4*  --  7.5*  MG 1.3* 2.3 2.0  PHOS  --   --  1.4*   GFR: CrCl cannot be calculated (Unknown ideal weight.). Liver Function Tests: Recent Labs  Lab 12/14/20 1532 12/15/20 0618  AST 217* 169*  ALT 34 29  ALKPHOS 81 70  BILITOT 2.7* 3.8*  PROT 7.2 6.1*  ALBUMIN 3.8 3.3*   Recent Labs  Lab 12/14/20 1532  LIPASE 129*   Recent Labs  Lab 12/14/20 1855  AMMONIA 61*   Coagulation Profile: Recent Labs  Lab 12/14/20 1725  INR 1.1   Cardiac Enzymes: No results for input(s): CKTOTAL, CKMB, CKMBINDEX, TROPONINI in the last 168 hours. BNP (last 3 results) No results for input(s): PROBNP in the last 8760 hours. HbA1C: No results for input(s): HGBA1C in the last 72 hours. CBG: No results for input(s): GLUCAP in the last 168 hours. Lipid Profile: No results for input(s): CHOL, HDL, LDLCALC, TRIG, CHOLHDL,  LDLDIRECT in the last 72 hours. Thyroid Function Tests: No results for input(s): TSH, T4TOTAL, FREET4, T3FREE, THYROIDAB in the last 72 hours. Anemia Panel: Recent Labs    12/14/20 1725 12/14/20 1855  VITAMINB12 362  --   FOLATE  --  1.9*   Sepsis Labs: Recent Labs  Lab 12/14/20 1725 12/14/20 2300 12/15/20 0140  LATICACIDVEN 3.9* 1.6 1.0    No results found for this or any previous visit (from the past 240 hour(s)).       Radiology Studies: DG Chest 2 View  Result Date: 12/14/2020 CLINICAL DATA:  Chest pain. EXAM: CHEST - 2 VIEW COMPARISON:  Chest x-ray 10/11/2020. FINDINGS: The heart size and mediastinal contours are within normal limits. Both lungs are clear. The visualized skeletal structures are unremarkable. IMPRESSION: No active  cardiopulmonary disease. Electronically Signed   By: Darliss Cheney M.D.   On: 12/14/2020 16:13   CT Head Wo Contrast  Result Date: 12/14/2020 CLINICAL DATA:  Head trauma moderate to severe.  Multiple falls EXAM: CT HEAD WITHOUT CONTRAST CT CERVICAL SPINE WITHOUT CONTRAST TECHNIQUE: Multidetector CT imaging of the head and cervical spine was performed following the standard protocol without intravenous contrast. Multiplanar CT image reconstructions of the cervical spine were also generated. COMPARISON:  CT head 02/25/2020 FINDINGS: CT HEAD FINDINGS Brain: No evidence of acute infarction, hemorrhage, hydrocephalus, extra-axial collection or mass lesion/mass effect. Small hypodensity right internal capsule anteriorly unchanged from prior study compatible with chronic infarct. Small hypodensity left putamen not seen previously. This is most consistent with infarct of indeterminate age, likely chronic Vascular: Negative for hyperdense vessel Skull: Negative Sinuses/Orbits: Mild mucosal edema left maxillary sinus. This appears odontogenic with a periapical cyst around left upper molar. Negative orbit Other: None CT CERVICAL SPINE FINDINGS Alignment: Mild anterolisthesis C3-4 and C4-5 Skull base and vertebrae: Negative for fracture Soft tissues and spinal canal: Negative for soft tissue mass or edema Disc levels: Disc degeneration and spurring throughout the cervical spine most prominent C3 through C6. Asymmetric facet degeneration on the right. Marked right foraminal encroachment at C3-4 and C4-5. Marked left foraminal encroachment at C5-6. Upper chest: Lung apices clear bilaterally. Other: None IMPRESSION: 1. No acute intracranial hemorrhage. Small hypodensity left putamen compatible with infarct of indeterminate age. This was not present on the prior study but may be chronic. 2. Cervical spondylosis.  Negative for fracture. Electronically Signed   By: Marlan Palau M.D.   On: 12/14/2020 16:28   CT Cervical Spine  Wo Contrast  Result Date: 12/14/2020 CLINICAL DATA:  Head trauma moderate to severe.  Multiple falls EXAM: CT HEAD WITHOUT CONTRAST CT CERVICAL SPINE WITHOUT CONTRAST TECHNIQUE: Multidetector CT imaging of the head and cervical spine was performed following the standard protocol without intravenous contrast. Multiplanar CT image reconstructions of the cervical spine were also generated. COMPARISON:  CT head 02/25/2020 FINDINGS: CT HEAD FINDINGS Brain: No evidence of acute infarction, hemorrhage, hydrocephalus, extra-axial collection or mass lesion/mass effect. Small hypodensity right internal capsule anteriorly unchanged from prior study compatible with chronic infarct. Small hypodensity left putamen not seen previously. This is most consistent with infarct of indeterminate age, likely chronic Vascular: Negative for hyperdense vessel Skull: Negative Sinuses/Orbits: Mild mucosal edema left maxillary sinus. This appears odontogenic with a periapical cyst around left upper molar. Negative orbit Other: None CT CERVICAL SPINE FINDINGS Alignment: Mild anterolisthesis C3-4 and C4-5 Skull base and vertebrae: Negative for fracture Soft tissues and spinal canal: Negative for soft tissue mass or edema Disc levels: Disc degeneration  and spurring throughout the cervical spine most prominent C3 through C6. Asymmetric facet degeneration on the right. Marked right foraminal encroachment at C3-4 and C4-5. Marked left foraminal encroachment at C5-6. Upper chest: Lung apices clear bilaterally. Other: None IMPRESSION: 1. No acute intracranial hemorrhage. Small hypodensity left putamen compatible with infarct of indeterminate age. This was not present on the prior study but may be chronic. 2. Cervical spondylosis.  Negative for fracture. Electronically Signed   By: Marlan Palau M.D.   On: 12/14/2020 16:28   MR BRAIN WO CONTRAST  Result Date: 12/14/2020 CLINICAL DATA:  Follow-up examination for stroke. EXAM: MRI HEAD WITHOUT  CONTRAST TECHNIQUE: Multiplanar, multiecho pulse sequences of the brain and surrounding structures were obtained without intravenous contrast. COMPARISON:  CT from earlier the same day. FINDINGS: Brain: Cerebral volume within normal limits for age. Mild patchy T2/FLAIR hyperintensity involving the periventricular deep white matter both cerebral hemispheres, most consistent with chronic small vessel ischemic disease, mild in nature. No abnormal foci of restricted diffusion to suggest acute or subacute ischemia. Gray-white matter differentiation maintained. No encephalomalacia to suggest chronic cortical infarction. No acute intracranial hemorrhage. Few punctate chronic micro hemorrhages noted about the left thalamus and left caudate, within additional punctate hemorrhage at the posterior left temporal region. Findings likely related to underlying hypertension. No mass lesion, midline shift or mass effect. No hydrocephalus or extra-axial fluid collection. Pituitary gland suprasellar region normal. Vascular: Major intracranial vascular flow voids are well maintained. Skull and upper cervical spine: Cranial junction within normal limits. Bone marrow signal intensity diffusely decreased on T1 weighted sequence, nonspecific, but most commonly related to anemia, smoking or obesity. No scalp soft tissue abnormality. Sinuses/Orbits: Globes orbital soft tissues within normal limits. Paranasal sinuses and mastoid air cells are clear. Other: None. IMPRESSION: 1. No acute intracranial abnormality. 2. Mild chronic microvascular ischemic disease. Electronically Signed   By: Rise Mu M.D.   On: 12/14/2020 20:27        Scheduled Meds:  amLODipine  10 mg Oral Daily   enoxaparin (LOVENOX) injection  40 mg Subcutaneous Q24H   folic acid  1 mg Oral Daily   lactulose  20 g Oral TID   LORazepam  0-4 mg Intravenous Q4H   Followed by   Melene Muller ON 12/17/2020] LORazepam  0-4 mg Intravenous Q8H   metoprolol succinate   100 mg Oral Daily   multivitamin with minerals  1 tablet Oral Daily   pantoprazole  40 mg Oral Daily   thiamine  100 mg Oral Daily   Or   thiamine  100 mg Intravenous Daily   Continuous Infusions:  sodium chloride 75 mL/hr at 12/15/20 1420   potassium PHOSPHATE IVPB (in mmol)       LOS: 1 day    Time spent: 35 minutes    Carrie Schoonmaker G Jadwiga Faidley, MD [facility]   If 7PM-7AM, please contact night-coverage [email or web address] Password [email or web address] 12/15/2020, 3:07 PM

## 2020-12-15 NOTE — Progress Notes (Signed)
Pt jumped out of bed to use BSC; now back to bed and sleeping but has periods of agitation and impulsiveness that waxes and wanes.

## 2020-12-16 ENCOUNTER — Inpatient Hospital Stay (HOSPITAL_COMMUNITY): Payer: BC Managed Care – PPO

## 2020-12-16 DIAGNOSIS — F10939 Alcohol use, unspecified with withdrawal, unspecified: Secondary | ICD-10-CM

## 2020-12-16 LAB — COMPREHENSIVE METABOLIC PANEL
ALT: 29 U/L (ref 0–44)
AST: 213 U/L — ABNORMAL HIGH (ref 15–41)
Albumin: 3 g/dL — ABNORMAL LOW (ref 3.5–5.0)
Alkaline Phosphatase: 70 U/L (ref 38–126)
Anion gap: 12 (ref 5–15)
BUN: <5 mg/dL — ABNORMAL LOW (ref 6–20)
CO2: 21 mmol/L — ABNORMAL LOW (ref 22–32)
Calcium: 7.8 mg/dL — ABNORMAL LOW (ref 8.9–10.3)
Chloride: 102 mmol/L (ref 98–111)
Creatinine, Ser: 0.67 mg/dL (ref 0.44–1.00)
GFR, Estimated: >60 mL/min (ref >60)
Glucose, Bld: 114 mg/dL — ABNORMAL HIGH (ref 70–99)
Potassium: 2.9 mmol/L — ABNORMAL LOW (ref 3.5–5.1)
Sodium: 135 mmol/L (ref 135–145)
Total Bilirubin: 3 mg/dL — ABNORMAL HIGH (ref 0.3–1.2)
Total Protein: 5.5 g/dL — ABNORMAL LOW (ref 6.5–8.1)

## 2020-12-16 LAB — CBC
HCT: 23.8 % — ABNORMAL LOW (ref 36.0–46.0)
Hemoglobin: 7.9 g/dL — ABNORMAL LOW (ref 12.0–15.0)
MCH: 31.7 pg (ref 26.0–34.0)
MCHC: 33.2 g/dL (ref 30.0–36.0)
MCV: 95.6 fL (ref 80.0–100.0)
Platelets: 89 10*3/uL — ABNORMAL LOW (ref 150–400)
RBC: 2.49 MIL/uL — ABNORMAL LOW (ref 3.87–5.11)
RDW: 22.2 % — ABNORMAL HIGH (ref 11.5–15.5)
WBC: 2.7 10*3/uL — ABNORMAL LOW (ref 4.0–10.5)
nRBC: 0 % (ref 0.0–0.2)

## 2020-12-16 LAB — URINALYSIS, ROUTINE W REFLEX MICROSCOPIC
Bilirubin Urine: NEGATIVE
Glucose, UA: NEGATIVE mg/dL
Ketones, ur: NEGATIVE mg/dL
Leukocytes,Ua: NEGATIVE
Nitrite: NEGATIVE
Protein, ur: NEGATIVE mg/dL
Specific Gravity, Urine: 1.005 (ref 1.005–1.030)
pH: 7 (ref 5.0–8.0)

## 2020-12-16 LAB — POTASSIUM: Potassium: 3 mmol/L — ABNORMAL LOW (ref 3.5–5.1)

## 2020-12-16 LAB — AMMONIA: Ammonia: 67 umol/L — ABNORMAL HIGH (ref 9–35)

## 2020-12-16 MED ORDER — POTASSIUM CHLORIDE CRYS ER 20 MEQ PO TBCR: 40.0000 meq | EXTENDED_RELEASE_TABLET | Freq: Once | ORAL | Status: DC

## 2020-12-16 MED ORDER — LACTULOSE 10 GM/15ML PO SOLN
30.0000 g | Freq: Three times a day (TID) | ORAL | Status: DC
  Administered 2020-12-16 – 2020-12-17 (×3): 30 g via ORAL
  Filled 2020-12-16 (×3): qty 45

## 2020-12-16 MED ORDER — TECHNETIUM TC 99M MEBROFENIN IV KIT
7.9000 | PACK | Freq: Once | INTRAVENOUS | Status: AC | PRN
  Administered 2020-12-16: 7.9 via INTRAVENOUS

## 2020-12-16 MED ORDER — LIP MEDEX EX OINT
1.0000 "application " | TOPICAL_OINTMENT | CUTANEOUS | Status: DC | PRN
  Administered 2020-12-16: 1 via TOPICAL
  Filled 2020-12-16: qty 7

## 2020-12-16 MED ORDER — KCL IN DEXTROSE-NACL 20-5-0.45 MEQ/L-%-% IV SOLN
INTRAVENOUS | Status: DC
  Filled 2020-12-16 (×2): qty 1000

## 2020-12-16 MED ORDER — POTASSIUM CHLORIDE 10 MEQ/100ML IV SOLN
10.0000 meq | INTRAVENOUS | Status: AC
  Administered 2020-12-16 (×4): 10 meq via INTRAVENOUS
  Filled 2020-12-16 (×4): qty 100

## 2020-12-16 MED ORDER — PROCHLORPERAZINE EDISYLATE 10 MG/2ML IJ SOLN
5.0000 mg | Freq: Once | INTRAMUSCULAR | Status: AC
  Administered 2020-12-16: 5 mg via INTRAVENOUS
  Filled 2020-12-16: qty 2

## 2020-12-16 NOTE — Evaluation (Signed)
Physical Therapy Evaluation Patient Details Name: Nicole Hughes MRN: [id number] DOB: [birth year] Today's Date: 12/16/2020  History of Present Illness  THis 50 y.o. female admitted with recurrent falls (~ 8 falls in 6 weeks).  She does endorse hitting her head with + LOC. She also reported midsternal sharp pain and lt shoulder pain. MRI of brain with no acute intracranial abnormality.  Neurology consulsted and work up underway with current dx: ETOH withdrawal, hypomagnesia, hypokalemia, PMH includes:  ETOH abuse (drinks a pint of ETOH daily), anxiety, cardiomyopathy, chronic LBP, depression, GERD, Migraine, h/o PE  Clinical Impression   Pt admitted secondary to problem above with deficits below. PTA patient was having frequent falls which she describes as her knees just suddenly give out. She was using walker for longer distances, but not inside home.  Pt currently requires minguard assist for safety (due to h/o falls) and ambulated 25 ft with RW with shuffling gait with knees locked in extension. Pain is her main complaint.  Anticipate patient will benefit from PT to address problems listed below.Will continue to follow acutely to maximize functional mobility independence and safety.          Recommendations for follow up therapy are one component of a multi-disciplinary discharge planning process, led by the attending physician.  Recommendations may be updated based on patient status, additional functional criteria and insurance authorization.  Follow Up Recommendations Home health PT    Assistance Recommended at Discharge Intermittent Supervision/Assistance  Functional Status Assessment Patient has had a recent decline in their functional status and demonstrates the ability to make significant improvements in function in a reasonable and predictable amount of time.  Equipment Recommendations  None recommended by PT    Recommendations for Other Services OT consult     Precautions /  Restrictions Precautions Precautions: Fall Precaution Comments: reports her legs just give out and she falls Restrictions Weight Bearing Restrictions: No      Mobility  Bed Mobility Overal bed mobility: Needs Assistance Bed Mobility: Rolling;Sidelying to Sit;Sit to Supine Rolling: Supervision Sidelying to sit: Supervision   Sit to supine: Supervision   General bed mobility comments: for safety due to lethargy    Transfers Overall transfer level: Needs assistance Equipment used: Rolling walker (2 wheels) Transfers: Sit to/from Stand Sit to Stand: Min guard           General transfer comment: for safety due to recent falls and lethargy    Ambulation/Gait Ambulation/Gait assistance: Min guard Gait Distance (Feet): 25 Feet Assistive device: Rolling walker (2 wheels) Gait Pattern/deviations: Step-to pattern;Decreased step length - right;Decreased step length - left;Shuffle   Gait velocity interpretation: <1.31 ft/sec, indicative of household ambulator   General Gait Details: pt walks with knees extended, shuffling feet along the floor with step length ~4" bil  Stairs            Wheelchair Mobility    Modified Rankin (Stroke Patients Only)       Balance Overall balance assessment: Needs assistance Sitting-balance support: No upper extremity supported;Feet supported Sitting balance-Leahy Scale: Fair     Standing balance support: Bilateral upper extremity supported;Reliant on assistive device for balance Standing balance-Leahy Scale: Poor                               Pertinent Vitals/Pain Pain Assessment: 0-10 Pain Score: 8  Pain Location: chest and bil LEs Pain Descriptors / Indicators: Constant;Discomfort;Grimacing;Numbness;Sharp;Stabbing;Tingling Pain Intervention(s): Limited activity within  patient's tolerance    Home Living Family/patient expects to be discharged to:: Private residence Living Arrangements: Spouse/significant  other;Other relatives (aunt) Available Help at Discharge: Family;Available PRN/intermittently Type of Home: House Home Access: Stairs to enter Entrance Stairs-Rails: None Entrance Stairs-Number of Steps: 1   Home Layout: One level Home Equipment: Agricultural consultant (2 wheels);BSC/3in1;Wheelchair - manual      Prior Function Prior Level of Function : Needs assist       Physical Assist : Mobility (physical);ADLs (physical) Mobility (physical): Gait   Mobility Comments: at least 6 falls recently; twice fell forward and +LOC       Hand Dominance        Extremity/Trunk Assessment   Upper Extremity Assessment Upper Extremity Assessment: Defer to OT evaluation    Lower Extremity Assessment Lower Extremity Assessment: Generalized weakness;RLE deficits/detail;LLE deficits/detail RLE: Unable to fully assess due to pain LLE: Unable to fully assess due to pain    Cervical / Trunk Assessment Cervical / Trunk Assessment: Normal  Communication   Communication: No difficulties  Cognition Arousal/Alertness: Lethargic;Suspect due to medications Behavior During Therapy: Flat affect Overall Cognitive Status: No family/caregiver present to determine baseline cognitive functioning                                 General Comments: followed all simple commands        General Comments      Exercises     Assessment/Plan    PT Assessment Patient needs continued PT services  PT Problem List Decreased activity tolerance;Decreased balance;Decreased mobility;Decreased knowledge of use of DME;Impaired sensation;Pain       PT Treatment Interventions DME instruction;Gait training;Functional mobility training;Therapeutic activities;Therapeutic exercise;Patient/family education    PT Goals (Current goals can be found in the Care Plan section)  Acute Rehab PT Goals Patient Stated Goal: to have less pain in her legs PT Goal Formulation: With patient Time For Goal  Achievement: 12/30/20 Potential to Achieve Goals: Good    Frequency Min 3X/week   Barriers to discharge        Co-evaluation               AM-PAC PT "6 Clicks" Mobility  Outcome Measure Help needed turning from your back to your side while in a flat bed without using bedrails?: None Help needed moving from lying on your back to sitting on the side of a flat bed without using bedrails?: A Little Help needed moving to and from a bed to a chair (including a wheelchair)?: A Little Help needed standing up from a chair using your arms (e.g., wheelchair or bedside chair)?: A Little Help needed to walk in hospital room?: A Little Help needed climbing 3-5 steps with a railing? : Total 6 Click Score: 17    End of Session Equipment Utilized During Treatment: Gait belt Activity Tolerance: Patient tolerated treatment well Patient left: in bed;with call bell/phone within reach;with bed alarm set;with nursing/sitter in room Nurse Communication: Mobility status PT Visit Diagnosis: Repeated falls (R29.6)    Time: 4270-6237 PT Time Calculation (min) (ACUTE ONLY): 20 min   Charges:   PT Evaluation $PT Eval Moderate Complexity: 1 Mod           Jerolyn Center, PT Acute Rehabilitation Services  Pager [phone] Office [phone]   Zena Amos 12/16/2020, 2:44 PM

## 2020-12-16 NOTE — TOC Initial Note (Signed)
Transition of Care [facility]) - Initial/Assessment Note    Patient Details  Name: Nicole Hughes MRN: [id number] Date of Birth: 26-May-1970  Transition of Care [facility]) CM/SW Contact:    Verna Czech [place], [place] Phone Number: [phone] 12/16/2020, 11:57 AM  Clinical Narrative:                  CSW came to see patient at bedside-patient getting testing done not in room.  Transition of Care to continue to follow  [facility], LCSW Transition of Care [phone]         Patient Goals and CMS Choice        Expected Discharge Plan and Services                                                Prior Living Arrangements/Services                       Activities of Daily Living Home Assistive Devices/Equipment: Cane (specify quad or straight), Walker (specify type), Wheelchair ADL Screening (condition at time of admission) Patient's cognitive ability adequate to safely complete daily activities?: No Is the patient deaf or have difficulty hearing?: No Does the patient have difficulty seeing, even when wearing glasses/contacts?: No Does the patient have difficulty concentrating, remembering, or making decisions?: No Patient able to express need for assistance with ADLs?: Yes Does the patient have difficulty dressing or bathing?: No Independently performs ADLs?: Yes (appropriate for developmental age) Does the patient have difficulty walking or climbing stairs?: Yes Weakness of Legs: Both Weakness of Arms/Hands: Both  Permission Sought/Granted                  Emotional Assessment              Admission diagnosis:  Alcohol withdrawal (HCC) [F10.939] Hypokalemia [E87.6] Metabolic acidosis, increased anion gap (IAG) [E87.29] Hypomagnesemia [E83.42] Paresthesia and pain of extremity [R20.2, M79.609] Alcohol withdrawal syndrome with complication [facility]) [F10.939] Patient Active Problem List   Diagnosis Date Noted   High anion gap  metabolic acidosis 12/14/2020   Fall at home, initial encounter 12/14/2020   Falls frequently 12/14/2020   Syncope 12/14/2020   Folic acid deficiency 12/14/2020   Acute diastolic CHF (congestive heart failure) (HCC) 12/14/2020   Alcohol withdrawal (HCC) 07/08/2020   Prolonged QT interval 05/19/2020   Hypomagnesemia 12/24/2019   Dysphagia 05/31/2019   Anemia 03/31/2018   Appetite loss 03/08/2018   Myofascial pain 03/08/2018   IBS (irritable bowel syndrome) 03/08/2018   Alcoholic hepatitis without ascites 03/05/2018   Elevated LFTs 03/03/2018   Achilles tendonitis 01/28/2018   Abnormal cervical Papanicolaou smear 09/29/2017   Insomnia 09/29/2017   Chest wall pain 05/25/2017   Elevated lipase 05/25/2017   Depression with anxiety 02/23/2017   MRSA carrier 02/23/2017   Hypokalemia 09/24/2016   Alcohol abuse 09/22/2016   H/O acute pancreatitis 09/22/2016   GERD (gastroesophageal reflux disease) 04/09/2015   Epigastric pain 01/17/2014   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/24/2012   Chest pain 08/20/2012   Stress reaction 07/23/2012   Essential hypertension 06/09/2012   History of pulmonary embolism 06/09/2012   Preop cardiovascular exam 06/08/2012   Dysmenorrhea    Dyspareunia    Pelvic pain    PCP:  Tower, Audrie Gallus, MD Pharmacy:   [id number] [place]  [place]  [address] [place] [address] Phone: [phone] Fax: [phone]  [facility]) - [place], [place] - [address] [facility] [address] [place] [address] Phone: [phone] Fax: [phone]     Social Determinants of Health (SDOH) Interventions    Readmission Risk Interventions No flowsheet data found.

## 2020-12-16 NOTE — Evaluation (Signed)
Occupational Therapy Evaluation Patient Details Name: Nicole Hughes MRN: [id number] DOB: [birth year] Today's Date: 12/16/2020   History of Present Illness THis 50 y.o. female admitted with recurrent falls (~ 8 falls in 6 weeks).  She does endorse hitting her head with + LOC. She also reported midsternal sharp pain and lt shoulder pain. MRI of brain with no acute intracranial abnormality.  Neurology consulsted and work up underway with current dx: ETOH withdrawal, hypomagnesia, hypokalemia, PMH includes:  ETOH abuse (drinks a pint of ETOH daily), anxiety, cardiomyopathy, chronic LBP, depression, GERD, Migraine, h/o PE   Clinical Impression   Nicole Hughes was evaluated s/p the above admission list; PTA she was mod I with use of a RW and several falls recently due to her legs "just giving out." She lives in a 1 level home, 1 STE with her husband who works during the day. Upon evaluation pt was limited by poor activity tolerance, impaired balance, generalized weakness, lethargy and bilat LE pain. Pt not required min A for simple transfers and functional ambulation with RW, and up to max A for LB ADLs. She required gentle encouragement to participate with therapy. She will benefit from OT acutely. Recommend d/c home with home health OT.    Recommendations for follow up therapy are one component of a multi-disciplinary discharge planning process, led by the attending physician.  Recommendations may be updated based on patient status, additional functional criteria and insurance authorization.   Follow Up Recommendations  Home health OT    Assistance Recommended at Discharge Frequent or constant Supervision/Assistance  Functional Status Assessment  Patient has had a recent decline in their functional status and demonstrates the ability to make significant improvements in function in a reasonable and predictable amount of time.  Equipment Recommendations  Tub/shower bench       Precautions /  Restrictions Precautions Precautions: Fall Precaution Comments: reports her legs just give out and she falls Restrictions Weight Bearing Restrictions: No      Mobility Bed Mobility Overal bed mobility: Needs Assistance Bed Mobility: Rolling;Sidelying to Sit;Sit to Supine Rolling: Supervision Sidelying to sit: Supervision   Sit to supine: Supervision   General bed mobility comments: +incrased time and verbal encouragemetn    Transfers Overall transfer level: Needs assistance Equipment used: Rolling walker (2 wheels) Transfers: Sit to/from Stand Sit to Stand: Min assist           General transfer comment: for safety due to recent falls and lethargy      Balance Overall balance assessment: Needs assistance Sitting-balance support: No upper extremity supported;Feet supported Sitting balance-Leahy Scale: Fair     Standing balance support: Bilateral upper extremity supported;Reliant on assistive device for balance Standing balance-Leahy Scale: Poor                             ADL either performed or assessed with clinical judgement   ADL Overall ADL's : Needs assistance/impaired Eating/Feeding: Independent;Sitting   Grooming: Set up;Sitting   Upper Body Bathing: Set up;Supervision/ safety;Sitting   Lower Body Bathing: Maximal assistance;Sit to/from stand   Upper Body Dressing : Supervision/safety;Sitting   Lower Body Dressing: Maximal assistance;Sit to/from stand   Toilet Transfer: Minimal assistance;Ambulation;Rolling walker (2 wheels)   Toileting- Clothing Manipulation and Hygiene: Min guard;Sitting/lateral lean       Functional mobility during ADLs: Minimal assistance;Cueing for sequencing;Rolling walker (2 wheels) General ADL Comments: max A for LB dressing due to poor efffort given this session.  pt with complaints of bilat LE pain. Pt also limited by generalized weakness and poor activity tolerance     Vision Baseline Vision/History: 0 No  visual deficits Ability to See in Adequate Light: 0 Adequate Vision Assessment?: No apparent visual deficits     Perception     Praxis      Pertinent Vitals/Pain Pain Assessment: Faces Pain Score: 8  Faces Pain Scale: Hurts little more Pain Location: bilat LEs Pain Descriptors / Indicators: Pins and needles Pain Intervention(s): Monitored during session;Limited activity within patient's tolerance     Hand Dominance Right   Extremity/Trunk Assessment Upper Extremity Assessment Upper Extremity Assessment: Generalized weakness;RUE deficits/detail;LUE deficits/detail RUE Deficits / Details: generally 3/5 with poor effort given. limited over head ROM LUE Deficits / Details: generally 3/5 with poor effort given. limited over head ROM   Lower Extremity Assessment Lower Extremity Assessment: Defer to PT evaluation RLE: Unable to fully assess due to pain LLE: Unable to fully assess due to pain   Cervical / Trunk Assessment Cervical / Trunk Assessment: Normal   Communication Communication Communication: No difficulties   Cognition Arousal/Alertness: Lethargic;Suspect due to medications Behavior During Therapy: Flat affect Overall Cognitive Status: No family/caregiver present to determine baseline cognitive functioning                                 General Comments: followed all simple commands     General Comments  VSS on RA, pt requesting ativan at the end of the session    Exercises     Shoulder Instructions      Home Living Family/patient expects to be discharged to:: Private residence Living Arrangements: Spouse/significant other;Other relatives Available Help at Discharge: Family;Available PRN/intermittently Type of Home: House Home Access: Stairs to enter [employer] of Steps: 1 Entrance Stairs-Rails: None Home Layout: One level     Bathroom Shower/Tub: Chief Strategy Officer: Standard Bathroom Accessibility: Yes    Home Equipment: Agricultural consultant (2 wheels);BSC/3in1;Wheelchair - manual          Prior Functioning/Environment Prior Level of Function : Needs assist       Physical Assist : Mobility (physical);ADLs (physical) Mobility (physical): Gait   Mobility Comments: at least 6 falls recently; twice fell forward and +LOC ADLs Comments: has help for getting in/out of shower        OT Problem List: Decreased strength;Decreased range of motion;Decreased activity tolerance;Impaired balance (sitting and/or standing);Decreased safety awareness;Decreased knowledge of use of DME or AE;Decreased knowledge of precautions;Pain      OT Treatment/Interventions: Self-care/ADL training;Therapeutic exercise;Therapeutic activities;Patient/family education;Balance training;DME and/or AE instruction    OT Goals(Current goals can be found in the care plan section) Acute Rehab OT Goals Patient Stated Goal: to get some sleep OT Goal Formulation: With patient Time For Goal Achievement: 12/30/20 Potential to Achieve Goals: Fair ADL Goals Pt Will Perform Grooming: with supervision;standing Pt Will Perform Upper Body Dressing: with modified independence;sitting Pt Will Perform Lower Body Dressing: with modified independence;sit to/from stand Pt Will Transfer to Toilet: with supervision;ambulating Pt/caregiver will Perform Home Exercise Program: Increased ROM;Increased strength;Both right and left upper extremity;With written HEP provided  OT Frequency: Min 2X/week   Barriers to D/C:            Co-evaluation              AM-PAC OT "6 Clicks" Daily Activity     Outcome Measure Help from another  person eating meals?: None Help from another person taking care of personal grooming?: A Little Help from another person toileting, which includes using toliet, bedpan, or urinal?: A Little Help from another person bathing (including washing, rinsing, drying)?: A Lot Help from another person to put on and  taking off regular upper body clothing?: A Little Help from another person to put on and taking off regular lower body clothing?: A Lot 6 Click Score: 17   End of Session Equipment Utilized During Treatment: Rolling walker (2 wheels) Nurse Communication: Mobility status  Activity Tolerance: Patient tolerated treatment well;Patient limited by lethargy Patient left: in bed;with call bell/phone within reach;with bed alarm set  OT Visit Diagnosis: Unsteadiness on feet (R26.81);Muscle weakness (generalized) (M62.81);Other abnormalities of gait and mobility (R26.89);History of falling (Z91.81);Pain                Time: 1545-1601 OT Time Calculation (min): 16 min Charges:  OT General Charges $OT Visit: 1 Visit OT Evaluation $OT Eval Moderate Complexity: 1 Mod   Aurelie Dicenzo A Nathali Vent 12/16/2020, 4:04 PM

## 2020-12-16 NOTE — Progress Notes (Signed)
PROGRESS NOTE    Nicole Hughes  DSK:[id number] DOB: Jan 16, 1970 DOA: 12/14/2020 PCP: Judy Pimple, MD   Brief Narrative:   Brief Narrative:  Nicole Hughes is a 50 y.o. female with medical history significant for alcohol abuse, anxiety/depression, chronic back pain, IBS, hypertension, remote history of provoked DVT and PE during pregnancy off anticoagulation who presents to the emergency department due to recurrent falls due to leg weakness, last fall was yesterday, where she states that she hit her head and lost consciousness.  Patient complained of left leg weakness which started about 6 weeks ago, this has since extended to right leg and she endorsed 8 falls within this time period.  She also complained of reproducible midsternal sharp chest pain and left shoulder pain which was constant and rated as 10/10 on pain scale, there was no aggravating/alleviating factors known.  Patient states that she drinks liquor daily and that she drinks 2-3 shots every couple of hours to prevent shakes.  She complained of 1 week of nausea and dry heaving.  She states that she has not been eating much in the last few days.  Patient also complaining of epigastric pain, but she denies fever  12/17/2018  Had fever last night  Blood culture done Tender in the right upper quadrant HIDA scan negative Probably pain secondary to alcoholic hepatitis Ammonia level increased from 61-67 Will increase the lactulose    Assessment & Plan: 1. No acute intracranial hemorrhage. Small hypodensity left putamen compatible with infarct of indeterminate age. This was not present on the prior study but may be chronic. 2. Cervical spondylosis.  Negative for fracture MRI1. No acute intracranial abnormality. 2. Mild chronic microvascular ischemic disease Plan neurochecks, neurology consult   Chronic alcohol abuse Alcohol level 44 Plan withdrawal protocol Ativan IV when can take p.o. switch to p.o.   Electrolyte  imbalance Hypokalemia hypomagnesemia hypophosphatemia Secondary to alcohol starvation ketosis dehydration Plan replace electrolytes per electrolyte replacement protocol   Metabolic acidosis with lactic acidosis with anion gap Secondary to alcoholic starvation ketosis dehydration Plan IV hydration follow the gap and lactic acid   Elevated ammonia level Rule out hepatic encephalopathy Was given lactulose repeat ammonia level in the morning Will check ultrasound right upper quadrant   Need to be careful with Ativan with elevated ammonia level     Elevated GOT- double then ALT And bilirubin Secondary to alcohol abuse  Mild pancytopenia Rule out secondary to hypersplenism Right  upper quadrant ultrasound Old ultrasound fatty liver rule out cirrhosis   Hemoglobin dropped from 10.3-8.6 Check H&H Protonix iv     Chest pain Noncardiac Reproductive chest pain troponin x2 negative Toradol as needed   Folic acid deficiency Continue folic acid   Reported syncopal episode Continue telemetry and watch for arrhythmias Troponins x2 was flat at 9; continue to trend troponin EKG showed normal sinus rhythm at a rate of 95 bpm Echocardiogram done in June 2019 showed LVEF of 60 to 65%.  Wall motion was normal and no RWMA.  Features were consistent with supranormal left ventricular filling pattern (grade 2 diastolic dysfunction) Echocardiogram will be done to rule out significant aortic stenosis or other outflow obstruction, and also to evaluate EF and to rule out segmental/Regional wall motion abnormalities.  Carotid artery Dopplers will be done to rule out hemodynamically significant stenosis   Multiple falls secondary to her reported leg weakness Continue fall precaution and neurochecks Continue PT/OT eval and treat Neurology was consulted by ED physician, MRI of  brain recommended showed no acute intracranial abnormality. Consider neurology follow-up for worsening symptoms despite PT/OT  eval and treatment   History of diastolic CHF Stable, patient does not appear fluid overloaded   Essential hypertension Continue amlodipine and metoprolol   GERD Continue Protonix, Carafate   Active Problems:   Essential hypertension   Hypokalemia   Chest wall pain   Elevated lipase   Hypomagnesemia   Alcohol withdrawal (HCC)   High anion gap metabolic acidosis   Fall at home, initial encounter   Falls frequently   Syncope   Folic acid deficiency   Acute diastolic CHF (congestive heart failure) (HCC)    DVT prophylaxis: (Lovenox/we will check platelets daily Code Status: Full code Family Communication: No family in the room Disposition Plan: 1 or 2 days     Consultants:  None  Subjective: Sleepy during my examination  Objective: Vitals:   12/16/20 0400 12/16/20 0532 12/16/20 0800 12/16/20 1256  BP: 129/82  (!) 141/74 (!) 149/83  Pulse: (!) 101  100 (!) 109  Resp: Temp: 98.8 F (37.1 C) 98.3 F (36.8 C) 98.2 F (36.8 C) 98.3 F (36.8 C)  TempSrc: Axillary Oral Oral Oral  SpO2: 99%  98% 100%    Intake/Output Summary (Last 24 hours) at 12/16/2020 1355 Last data filed at 12/15/2020 1844 Gross per 24 hour  Intake 1625.6 ml  Output 600 ml  Net 1025.6 ml   There were no vitals filed for this visit.  Examination:  General exam: Appears calm and comfortable  Respiratory system: Clear to auscultation. Respiratory effort normal. Cardiovascular system: S1 & S2 heard, RRR. No JVD, murmurs, rubs, gallops or clicks. No pedal edema. Gastrointestinal system: Abdomen is nondistended, soft and nontender. No organomegaly or masses felt. Normal bowel sounds heard. Central nervous system: Sleepy. No focal neurological deficits. Extremities: Symmetric 5 x 5 power. Skin: No rashes, lesions or ulcers Psychiatry: Unable to evaluate patient too sleepy    Data Reviewed: I have personally reviewed following labs and imaging studies  CBC: Recent Labs  Lab  12/14/20 1458 12/15/20 0618 12/16/20 0108  WBC 4.7 3.6* 2.7*  HGB 10.6* 8.4* 7.9*  HCT 33.1* 25.5* 23.8*  MCV 95.1 97.3 95.6  PLT 164 108* 89*   Basic Metabolic Panel: Recent Labs  Lab 12/14/20 1532 12/15/20 0235 12/15/20 0618 12/15/20 1801 12/16/20 0108  NA 136  --  136 135 135  K 2.9*  --  3.1* 2.8* 2.9*  CL 95*  --  103 101 102  CO2 17*  --  14* 19* 21*  GLUCOSE 81  --  70 78 114*  BUN 7  --  6 <5* <5*  CREATININE 0.72  --  0.91 0.61 0.67  CALCIUM 8.4*  --  7.5* 7.7* 7.8*  MG 1.3* 2.3 2.0  --   --   PHOS  --   --  1.4*  --   --    GFR: CrCl cannot be calculated (Unknown ideal weight.). Liver Function Tests: Recent Labs  Lab 12/14/20 1532 12/15/20 0618 12/16/20 0108  AST 217* 169* 213*  ALT 34 29 29  ALKPHOS 81 70 70  BILITOT 2.7* 3.8* 3.0*  PROT 7.2 6.1* 5.5*  ALBUMIN 3.8 3.3* 3.0*   Recent Labs  Lab 12/14/20 1532  LIPASE 129*   Recent Labs  Lab 12/14/20 1855 12/16/20 0108  AMMONIA 61* 67*   Coagulation Profile: Recent Labs  Lab 12/14/20 1725  INR 1.1   Cardiac Enzymes:  No results for input(s): CKTOTAL, CKMB, CKMBINDEX, TROPONINI in the last 168 hours. BNP (last 3 results) No results for input(s): PROBNP in the last 8760 hours. HbA1C: No results for input(s): HGBA1C in the last 72 hours. CBG: No results for input(s): GLUCAP in the last 168 hours. Lipid Profile: No results for input(s): CHOL, HDL, LDLCALC, TRIG, CHOLHDL, LDLDIRECT in the last 72 hours. Thyroid Function Tests: No results for input(s): TSH, T4TOTAL, FREET4, T3FREE, THYROIDAB in the last 72 hours. Anemia Panel: Recent Labs    12/14/20 1725 12/14/20 1855  VITAMINB12 362  --   FOLATE  --  1.9*   Sepsis Labs: Recent Labs  Lab 12/14/20 1725 12/14/20 2300 12/15/20 0140  LATICACIDVEN 3.9* 1.6 1.0    No results found for this or any previous visit (from the past 240 hour(s)).       Radiology Studies: DG Chest 2 View  Result Date: 12/14/2020 CLINICAL DATA:   Chest pain. EXAM: CHEST - 2 VIEW COMPARISON:  Chest x-ray 10/11/2020. FINDINGS: The heart size and mediastinal contours are within normal limits. Both lungs are clear. The visualized skeletal structures are unremarkable. IMPRESSION: No active cardiopulmonary disease. Electronically Signed   By: Darliss Cheney M.D.   On: 12/14/2020 16:13   CT Head Wo Contrast  Result Date: 12/14/2020 CLINICAL DATA:  Head trauma moderate to severe.  Multiple falls EXAM: CT HEAD WITHOUT CONTRAST CT CERVICAL SPINE WITHOUT CONTRAST TECHNIQUE: Multidetector CT imaging of the head and cervical spine was performed following the standard protocol without intravenous contrast. Multiplanar CT image reconstructions of the cervical spine were also generated. COMPARISON:  CT head 02/25/2020 FINDINGS: CT HEAD FINDINGS Brain: No evidence of acute infarction, hemorrhage, hydrocephalus, extra-axial collection or mass lesion/mass effect. Small hypodensity right internal capsule anteriorly unchanged from prior study compatible with chronic infarct. Small hypodensity left putamen not seen previously. This is most consistent with infarct of indeterminate age, likely chronic Vascular: Negative for hyperdense vessel Skull: Negative Sinuses/Orbits: Mild mucosal edema left maxillary sinus. This appears odontogenic with a periapical cyst around left upper molar. Negative orbit Other: None CT CERVICAL SPINE FINDINGS Alignment: Mild anterolisthesis C3-4 and C4-5 Skull base and vertebrae: Negative for fracture Soft tissues and spinal canal: Negative for soft tissue mass or edema Disc levels: Disc degeneration and spurring throughout the cervical spine most prominent C3 through C6. Asymmetric facet degeneration on the right. Marked right foraminal encroachment at C3-4 and C4-5. Marked left foraminal encroachment at C5-6. Upper chest: Lung apices clear bilaterally. Other: None IMPRESSION: 1. No acute intracranial hemorrhage. Small hypodensity left putamen  compatible with infarct of indeterminate age. This was not present on the prior study but may be chronic. 2. Cervical spondylosis.  Negative for fracture. Electronically Signed   By: Marlan Palau M.D.   On: 12/14/2020 16:28   CT Cervical Spine Wo Contrast  Result Date: 12/14/2020 CLINICAL DATA:  Head trauma moderate to severe.  Multiple falls EXAM: CT HEAD WITHOUT CONTRAST CT CERVICAL SPINE WITHOUT CONTRAST TECHNIQUE: Multidetector CT imaging of the head and cervical spine was performed following the standard protocol without intravenous contrast. Multiplanar CT image reconstructions of the cervical spine were also generated. COMPARISON:  CT head 02/25/2020 FINDINGS: CT HEAD FINDINGS Brain: No evidence of acute infarction, hemorrhage, hydrocephalus, extra-axial collection or mass lesion/mass effect. Small hypodensity right internal capsule anteriorly unchanged from prior study compatible with chronic infarct. Small hypodensity left putamen not seen previously. This is most consistent with infarct of indeterminate age, likely chronic Vascular: Negative  for hyperdense vessel Skull: Negative Sinuses/Orbits: Mild mucosal edema left maxillary sinus. This appears odontogenic with a periapical cyst around left upper molar. Negative orbit Other: None CT CERVICAL SPINE FINDINGS Alignment: Mild anterolisthesis C3-4 and C4-5 Skull base and vertebrae: Negative for fracture Soft tissues and spinal canal: Negative for soft tissue mass or edema Disc levels: Disc degeneration and spurring throughout the cervical spine most prominent C3 through C6. Asymmetric facet degeneration on the right. Marked right foraminal encroachment at C3-4 and C4-5. Marked left foraminal encroachment at C5-6. Upper chest: Lung apices clear bilaterally. Other: None IMPRESSION: 1. No acute intracranial hemorrhage. Small hypodensity left putamen compatible with infarct of indeterminate age. This was not present on the prior study but may be chronic.  2. Cervical spondylosis.  Negative for fracture. Electronically Signed   By: Marlan Palau M.D.   On: 12/14/2020 16:28   MR BRAIN WO CONTRAST  Result Date: 12/14/2020 CLINICAL DATA:  Follow-up examination for stroke. EXAM: MRI HEAD WITHOUT CONTRAST TECHNIQUE: Multiplanar, multiecho pulse sequences of the brain and surrounding structures were obtained without intravenous contrast. COMPARISON:  CT from earlier the same day. FINDINGS: Brain: Cerebral volume within normal limits for age. Mild patchy T2/FLAIR hyperintensity involving the periventricular deep white matter both cerebral hemispheres, most consistent with chronic small vessel ischemic disease, mild in nature. No abnormal foci of restricted diffusion to suggest acute or subacute ischemia. Gray-white matter differentiation maintained. No encephalomalacia to suggest chronic cortical infarction. No acute intracranial hemorrhage. Few punctate chronic micro hemorrhages noted about the left thalamus and left caudate, within additional punctate hemorrhage at the posterior left temporal region. Findings likely related to underlying hypertension. No mass lesion, midline shift or mass effect. No hydrocephalus or extra-axial fluid collection. Pituitary gland suprasellar region normal. Vascular: Major intracranial vascular flow voids are well maintained. Skull and upper cervical spine: Cranial junction within normal limits. Bone marrow signal intensity diffusely decreased on T1 weighted sequence, nonspecific, but most commonly related to anemia, smoking or obesity. No scalp soft tissue abnormality. Sinuses/Orbits: Globes orbital soft tissues within normal limits. Paranasal sinuses and mastoid air cells are clear. Other: None. IMPRESSION: 1. No acute intracranial abnormality. 2. Mild chronic microvascular ischemic disease. Electronically Signed   By: Rise Mu M.D.   On: 12/14/2020 20:27   NM Hepatobiliary Liver Func  Result Date: 12/16/2020 CLINICAL  DATA:  Cholecystitis EXAM: NUCLEAR MEDICINE HEPATOBILIARY IMAGING TECHNIQUE: Sequential images of the abdomen were obtained out to 60 minutes following intravenous administration of radiopharmaceutical. RADIOPHARMACEUTICALS:  7.9 mCi Tc-67m  Choletec IV COMPARISON:  None. FINDINGS: Prompt uptake and biliary excretion of activity by the liver is seen. Gallbladder activity is visualized, consistent with patency of cystic duct. Biliary activity passes into small bowel, consistent with patent common bile duct. IMPRESSION: No evidence of acute cholecystitis. Electronically Signed   By: Allegra Lai M.D.   On: 12/16/2020 13:01   DG CHEST PORT 1 VIEW  Result Date: 12/16/2020 CLINICAL DATA:  Fever. EXAM: PORTABLE CHEST 1 VIEW COMPARISON:  Chest radiograph dated 12/14/2020. FINDINGS: Shallow inspiration. No focal consolidation, pleural effusion, or pneumothorax. The cardiac silhouette is within limits. No acute osseous pathology. IMPRESSION: No active disease. Electronically Signed   By: Elgie Collard M.D.   On: 12/16/2020 00:42   US Abdomen Limited RUQ (LIVER/GB)  Result Date: 12/15/2020 CLINICAL DATA:  Abdominal pain EXAM: ULTRASOUND ABDOMEN LIMITED RIGHT UPPER QUADRANT COMPARISON:  None. FINDINGS: Gallbladder: No visible stones or wall thickening. Small amount of pericholecystic fluid Common bile duct:  Diameter: Normal caliber, 5 mm Liver: Increased echotexture compatible with fatty infiltration. No focal abnormality or biliary ductal dilatation. Portal vein is patent on color Doppler imaging with normal direction of blood flow towards the liver. Other: None. IMPRESSION: Fatty infiltration of the liver. Small amount of pericholecystic fluid without visible gallstones or wall thickening. This may be related to liver disease. Electronically Signed   By: Charlett Nose M.D.   On: 12/15/2020 17:58        Scheduled Meds:  amLODipine  10 mg Oral Daily   enoxaparin (LOVENOX) injection  40 mg Subcutaneous  Q24H   folic acid  1 mg Oral Daily   lactulose  20 g Oral TID   LORazepam  0-4 mg Intravenous Q4H   Followed by   Melene Muller ON 12/17/2020] LORazepam  0-4 mg Intravenous Q8H   metoprolol succinate  100 mg Oral Daily   multivitamin with minerals  1 tablet Oral Daily   pantoprazole  40 mg Oral Daily   thiamine  100 mg Oral Daily   Or   thiamine  100 mg Intravenous Daily   Continuous Infusions:  dextrose 5 % and 0.45 % NaCl with KCl 20 mEq/L 75 mL/hr at 12/16/20 0755     LOS: 2 days    Time spent: 35 minutes    Rakim Moone G Klarisa Barman, MD [facility]  If 7PM-7AM, please contact night-coverage [email or web address] Password [email or web address] 12/16/2020, 1:55 PM

## 2020-12-16 NOTE — Progress Notes (Signed)
MD came to see pt at bedside; red mews due to fever of 101.6

## 2020-12-17 ENCOUNTER — Encounter (HOSPITAL_COMMUNITY): Payer: Self-pay | Admitting: Internal Medicine

## 2020-12-17 LAB — COMPREHENSIVE METABOLIC PANEL
ALT: 27 U/L (ref 0–44)
AST: 140 U/L — ABNORMAL HIGH (ref 15–41)
Albumin: 2.9 g/dL — ABNORMAL LOW (ref 3.5–5.0)
Alkaline Phosphatase: 73 U/L (ref 38–126)
Anion gap: 9 (ref 5–15)
BUN: <5 mg/dL — ABNORMAL LOW (ref 6–20)
CO2: 22 mmol/L (ref 22–32)
Calcium: 8.1 mg/dL — ABNORMAL LOW (ref 8.9–10.3)
Chloride: 105 mmol/L (ref 98–111)
Creatinine, Ser: 0.56 mg/dL (ref 0.44–1.00)
GFR, Estimated: >60 mL/min (ref >60)
Glucose, Bld: 141 mg/dL — ABNORMAL HIGH (ref 70–99)
Potassium: 3 mmol/L — ABNORMAL LOW (ref 3.5–5.1)
Sodium: 136 mmol/L (ref 135–145)
Total Bilirubin: 2.7 mg/dL — ABNORMAL HIGH (ref 0.3–1.2)
Total Protein: 5.5 g/dL — ABNORMAL LOW (ref 6.5–8.1)

## 2020-12-17 LAB — CBC
HCT: 24 % — ABNORMAL LOW (ref 36.0–46.0)
Hemoglobin: 8 g/dL — ABNORMAL LOW (ref 12.0–15.0)
MCH: 32.1 pg (ref 26.0–34.0)
MCHC: 33.3 g/dL (ref 30.0–36.0)
MCV: 96.4 fL (ref 80.0–100.0)
Platelets: 112 10*3/uL — ABNORMAL LOW (ref 150–400)
RBC: 2.49 MIL/uL — ABNORMAL LOW (ref 3.87–5.11)
RDW: 22.1 % — ABNORMAL HIGH (ref 11.5–15.5)
WBC: 2.9 10*3/uL — ABNORMAL LOW (ref 4.0–10.5)
nRBC: 0 % (ref 0.0–0.2)

## 2020-12-17 LAB — AMMONIA: Ammonia: 63 umol/L — ABNORMAL HIGH (ref 9–35)

## 2020-12-17 LAB — MAGNESIUM: Magnesium: 1.2 mg/dL — ABNORMAL LOW (ref 1.7–2.4)

## 2020-12-17 MED ORDER — PANTOPRAZOLE SODIUM 40 MG PO TBEC
40.0000 mg | DELAYED_RELEASE_TABLET | Freq: Every day | ORAL | Status: DC
  Administered 2020-12-17 – 2020-12-20 (×4): 40 mg via ORAL
  Filled 2020-12-17 (×4): qty 1

## 2020-12-17 MED ORDER — MAGNESIUM OXIDE -MG SUPPLEMENT 400 (240 MG) MG PO TABS
400.0000 mg | ORAL_TABLET | Freq: Two times a day (BID) | ORAL | Status: AC
  Administered 2020-12-17 (×2): 400 mg via ORAL
  Filled 2020-12-17 (×2): qty 1

## 2020-12-17 MED ORDER — POTASSIUM CHLORIDE CRYS ER 20 MEQ PO TBCR
40.0000 meq | EXTENDED_RELEASE_TABLET | Freq: Two times a day (BID) | ORAL | Status: AC
  Administered 2020-12-17 (×2): 40 meq via ORAL
  Filled 2020-12-17 (×2): qty 2

## 2020-12-17 MED ORDER — CYANOCOBALAMIN 1000 MCG/ML IJ SOLN
1000.0000 ug | Freq: Every day | INTRAMUSCULAR | Status: DC
  Administered 2020-12-17 – 2020-12-20 (×4): 1000 ug via INTRAMUSCULAR
  Filled 2020-12-17 (×4): qty 1

## 2020-12-17 MED ORDER — TRAZODONE HCL 50 MG PO TABS
100.0000 mg | ORAL_TABLET | Freq: Every evening | ORAL | Status: DC | PRN
  Administered 2020-12-18: 100 mg via ORAL
  Filled 2020-12-17: qty 2

## 2020-12-17 MED ORDER — LACTULOSE 10 GM/15ML PO SOLN
20.0000 g | Freq: Three times a day (TID) | ORAL | Status: DC
  Administered 2020-12-17 – 2020-12-20 (×8): 20 g via ORAL
  Filled 2020-12-17 (×8): qty 30

## 2020-12-17 MED ORDER — K PHOS MONO-SOD PHOS DI & MONO 155-852-130 MG PO TABS
500.0000 mg | ORAL_TABLET | Freq: Three times a day (TID) | ORAL | Status: AC
  Administered 2020-12-17 – 2020-12-18 (×4): 500 mg via ORAL
  Filled 2020-12-17 (×4): qty 2

## 2020-12-17 MED ORDER — SERTRALINE HCL 100 MG PO TABS
100.0000 mg | ORAL_TABLET | Freq: Every day | ORAL | Status: DC
  Administered 2020-12-17 – 2020-12-20 (×4): 100 mg via ORAL
  Filled 2020-12-17 (×4): qty 1

## 2020-12-17 NOTE — Progress Notes (Signed)
PT Cancellation Note  Patient Details Name: Nicole Hughes MRN: [id number] DOB: [birth year]   Cancelled Treatment:    Reason Eval/Treat Not Completed: Other (comment).  PT made two attempts to see pt, initially when pt was very much asleep and then when she was up to stand and declined to work with PT.  Talked with her about recovering strength and she still wanted to wait on therapy for now.  Retry for another time.   Ivar Drape 12/17/2020, 4:08 PM  Samul Dada, PT PhD Acute Rehab Dept. Number: [facility] [id number] and [facility] [phone]

## 2020-12-17 NOTE — Progress Notes (Signed)
[facility] PROGRESS NOTE    Progress Note  Nicole Hughes  IRJ:[id number] DOB: Jul 14, 1970 DOA: 12/14/2020 PCP: Judy Pimple, MD     Brief Narrative:   Nicole Hughes is an 50 y.o. female past medical history significant for alcohol abuse anxiety/depression, chronic back pain, essential hypertension, remote history of DVT PE during pregnancy off anticoagulation comes into the emergency room due to frequent falls and leg weakness    Assessment/Plan:   High anion gap metabolic acidosis likely due to alcohol and starvation ketosis: Consumes alcohol daily anion gap was 24 and admission, glucose was 81. Was started on IV fluid hydration, her anion gap is closed bicarb greater than 20. Discontinue IV fluids.  Recurrent falls likely due to bilateral leg weakness: In the setting of alcohol abuse, she relates she hit her head and lost consciousness. Twelve-lead EKG  show normal sinus rhythm normal axis nonspecific T wave changes. No events on telemetry. 12/14/2020 CT of the head showed no acute intracranial abnormalities, there was a small hypodensity left contaminant lesion of indeterminate age.  CT of the neck C-spine no acute findings. 12/14/2020 MRI of the brain showed acute CVA mild chronic microvascular ischemic changes. Physical therapy and Occupational Therapy evaluated the patient and recommended home health PT. Relates lower extremity pain will need EMG as an outpatient  Alcohol abuse: On admission 44 her level, was started on thiamine and folate. Monitor with CIWA protocol.  Electrolyte imbalance, hypokalemia/hypomagnesemia/hypophosphatemia: Secondary to alcohol abuse. Potassium still low discontinue IV potassium repletion. Will replete orally along with her magnesium and phosphorus recheck this afternoon.  Elevated ammonia level: Was started on lactulose, as her lactulose was elevated.  Elevated LFTs: Likely due to alcohol abuse and fatty liver. Right upper  quadrant ultrasound showed fatty liver small pericholecystic fluid no visible gallstone or gallbladder thickening. HIDA scan was done showed no acute cholecystitis. LFTs continue to improve  Chronic Pancytopenia : Likely due to alcohol abuse.  Chronic thrombocytopenia: In setting of alcohol abuse.  Normocytic anemia: Currently on PPI transition to oral signs of oral bleeding hemoglobin has remained stable.  Chronic diastolic heart failure: Appears to be euvolemic.  Essential hypertension: Continue amlodipine and metoprolol.  Sinus tachycardia: Has improved with restarting of metoprolol.  DVT prophylaxis: lovenox Family Communication:none Status is: Inpatient  Remains inpatient appropriate because: High anion gap metabolic acidosis        Code Status:     Code Status Orders  (From admission, onward)           Start     Ordered   12/15/20 0545  Full code  Continuous        12/15/20 0544           Code Status History     Date Active Date Inactive Code Status Order ID Comments User Context   10/11/2020 0920 10/11/2020 2033 Full Code [id number]  Jeannie Fend PA-C ED   07/08/2020 1503 07/12/2020 1843 Full Code [id number]  Emeline General, MD ED   07/08/2020 1157 07/08/2020 1503 Full Code [id number]  Fayrene Helper, PA-C ED   05/19/2020 1359 05/22/2020 1506 Full Code [id number]  Clydie Braun, MD ED   04/16/2020 1323 04/19/2020 1524 Full Code [id number]  Clydie Braun, MD ED   12/24/2019 1606 12/26/2019 1547 Full Code [id number]  Jae Dire, MD ED   03/03/2018 0014 03/05/2018 1739 Full Code [id number]  Eduard Clos, MD ED   02/17/2017 0203 02/18/2017 2219 Full Code [id number]  Briscoe Deutscher, MD ED         IV Access:   Peripheral IV   Procedures and diagnostic studies:   NM Hepatobiliary Liver Func  Result Date: 12/16/2020 CLINICAL DATA:  Cholecystitis EXAM: NUCLEAR MEDICINE HEPATOBILIARY IMAGING TECHNIQUE: Sequential images of the abdomen were  obtained out to 60 minutes following intravenous administration of radiopharmaceutical. RADIOPHARMACEUTICALS:  7.9 mCi Tc-27m  Choletec IV COMPARISON:  None. FINDINGS: Prompt uptake and biliary excretion of activity by the liver is seen. Gallbladder activity is visualized, consistent with patency of cystic duct. Biliary activity passes into small bowel, consistent with patent common bile duct. IMPRESSION: No evidence of acute cholecystitis. Electronically Signed   By: Allegra Lai M.D.   On: 12/16/2020 13:01   DG CHEST PORT 1 VIEW  Result Date: 12/16/2020 CLINICAL DATA:  Fever. EXAM: PORTABLE CHEST 1 VIEW COMPARISON:  Chest radiograph dated 12/14/2020. FINDINGS: Shallow inspiration. No focal consolidation, pleural effusion, or pneumothorax. The cardiac silhouette is within limits. No acute osseous pathology. IMPRESSION: No active disease. Electronically Signed   By: Elgie Collard M.D.   On: 12/16/2020 00:42   US Abdomen Limited RUQ (LIVER/GB)  Result Date: 12/15/2020 CLINICAL DATA:  Abdominal pain EXAM: ULTRASOUND ABDOMEN LIMITED RIGHT UPPER QUADRANT COMPARISON:  None. FINDINGS: Gallbladder: No visible stones or wall thickening. Small amount of pericholecystic fluid Common bile duct: Diameter: Normal caliber, 5 mm Liver: Increased echotexture compatible with fatty infiltration. No focal abnormality or biliary ductal dilatation. Portal vein is patent on color Doppler imaging with normal direction of blood flow towards the liver. Other: None. IMPRESSION: Fatty infiltration of the liver. Small amount of pericholecystic fluid without visible gallstones or wall thickening. This may be related to liver disease. Electronically Signed   By: Charlett Nose M.D.   On: 12/15/2020 17:58     Medical Consultants:   None.   Subjective:    Nicole Hughes relates her tingling and leg pain bilaterally.  Objective:    Vitals:   12/16/20 2114 12/17/20 0000 12/17/20 0338 12/17/20 0730  BP:  (!) 155/97  133/88 (!) 152/101  Pulse:  (!) 104 (!) 102 100  Resp:  Temp:  98 F (36.7 C) 99.1 F (37.3 C) 99.6 F (37.6 C)  TempSrc:  Oral Oral Oral  SpO2: 100% 100% 99% 100%   SpO2: 100 %   Intake/Output Summary (Last 24 hours) at 12/17/2020 1610 Last data filed at 12/16/2020 2000 Gross per 24 hour  Intake 1062.84 ml  Output 750 ml  Net 312.84 ml   There were no vitals filed for this visit.  Exam: General exam: In no acute distress. Respiratory system: Good air movement and clear to auscultation. Cardiovascular system: S1 & S2 heard, RRR. No JVD. Gastrointestinal system: Abdomen is nondistended, soft and nontender.  Extremities: No pedal edema. Skin: No rashes, lesions or ulcers Psychiatry: Judgement and insight appear normal. Mood & affect appropriate.    Data Reviewed:    Labs: Basic Metabolic Panel: Recent Labs  Lab 12/14/20 1532 12/15/20 0235 12/15/20 0618 12/15/20 1801 12/16/20 0108 12/16/20 1853 12/17/20 0156  NA 136  --  136 135 135  --  136  K 2.9*  --  3.1* 2.8* 2.9* 3.0* 3.0*  CL 95*  --  103 101 102  --  105  CO2 17*  --  14* 19* 21*  --  22  GLUCOSE 81  --  70 78 114*  --  141*  BUN 7  --  6 <5* <5*  --  <5*  CREATININE 0.72  --  0.91 0.61 0.67  --  0.56  CALCIUM 8.4*  --  7.5* 7.7* 7.8*  --  8.1*  MG 1.3* 2.3 2.0  --   --   --   --   PHOS  --   --  1.4*  --   --   --   --    GFR CrCl cannot be calculated (Unknown ideal weight.). Liver Function Tests: Recent Labs  Lab 12/14/20 1532 12/15/20 0618 12/16/20 0108 12/17/20 0156  AST 217* 169* 213* 140*  ALT 34 29 29 27   ALKPHOS 81 70 70 73  BILITOT 2.7* 3.8* 3.0* 2.7*  PROT 7.2 6.1* 5.5* 5.5*  ALBUMIN 3.8 3.3* 3.0* 2.9*   Recent Labs  Lab 12/14/20 1532  LIPASE 129*   Recent Labs  Lab 12/14/20 1855 12/16/20 0108 12/17/20 0156  AMMONIA 61* 67* 63*   Coagulation profile Recent Labs  Lab 12/14/20 1725  INR 1.1   COVID-19 Labs  Recent Labs    12/14/20 1725  DDIMER 0.42     Lab Results  Component Value Date   SARSCOV2NAA NEGATIVE 07/08/2020   SARSCOV2NAA NEGATIVE 05/19/2020   SARSCOV2NAA NEGATIVE 12/24/2019   SARSCOV2NAA NEGATIVE 11/27/2019    CBC: Recent Labs  Lab 12/14/20 1458 12/15/20 0618 12/16/20 0108 12/17/20 0156  WBC 4.7 3.6* 2.7* 2.9*  HGB 10.6* 8.4* 7.9* 8.0*  HCT 33.1* 25.5* 23.8* 24.0*  MCV 95.1 97.3 95.6 96.4  PLT 164 108* 89* 112*   Cardiac Enzymes: No results for input(s): CKTOTAL, CKMB, CKMBINDEX, TROPONINI in the last 168 hours. BNP (last 3 results) No results for input(s): PROBNP in the last 8760 hours. CBG: No results for input(s): GLUCAP in the last 168 hours. D-Dimer: Recent Labs    12/14/20 1725  DDIMER 0.42   Hgb A1c: No results for input(s): HGBA1C in the last 72 hours. Lipid Profile: No results for input(s): CHOL, HDL, LDLCALC, TRIG, CHOLHDL, LDLDIRECT in the last 72 hours. Thyroid function studies: No results for input(s): TSH, T4TOTAL, T3FREE, THYROIDAB in the last 72 hours.  Invalid input(s): FREET3 Anemia work up: Recent Labs    12/14/20 1725 12/14/20 1855  VITAMINB12 362  --   FOLATE  --  1.9*   Sepsis Labs: Recent Labs  Lab 12/14/20 1458 12/14/20 1725 12/14/20 2300 12/15/20 0140 12/15/20 0618 12/16/20 0108 12/17/20 0156  WBC 4.7  --   --   --  3.6* 2.7* 2.9*  LATICACIDVEN  --  3.9* 1.6 1.0  --   --   --    Microbiology Recent Results (from the past 240 hour(s))  Culture, blood (routine x 2)     Status: None (Preliminary result)   Collection Time: 12/16/20  1:08 AM   Specimen: BLOOD LEFT HAND  Result Value Ref Range Status   Specimen Description BLOOD LEFT HAND  Final   Special Requests   Final    BOTTLES DRAWN AEROBIC ONLY Blood Culture results may not be optimal due to an inadequate volume of blood received in culture bottles   Culture   Final    NO GROWTH 1 DAY Performed at [facility], [address], [place], [place]    Report Status PENDING  Incomplete   Culture, blood (routine x 2)     Status: None (Preliminary result)   Collection Time: 12/16/20  1:09 AM   Specimen: BLOOD RIGHT HAND  Result Value Ref Range Status  Specimen Description BLOOD RIGHT HAND  Final   Special Requests   Final    BOTTLES DRAWN AEROBIC AND ANAEROBIC Blood Culture adequate volume   Culture   Final    NO GROWTH 1 DAY Performed at [facility], [address], [place], [place] [address]    Report Status PENDING  Incomplete     Medications:    amLODipine  10 mg Oral Daily   enoxaparin (LOVENOX) injection  40 mg Subcutaneous Q24H   folic acid  1 mg Oral Daily   lactulose  30 g Oral TID   LORazepam  0-4 mg Intravenous Q8H   metoprolol succinate  100 mg Oral Daily   multivitamin with minerals  1 tablet Oral Daily   pantoprazole  40 mg Oral Daily   thiamine  100 mg Oral Daily   Or   thiamine  100 mg Intravenous Daily   Continuous Infusions:  dextrose 5 % and 0.45 % NaCl with KCl 20 mEq/L 75 mL/hr at 12/16/20 2122      LOS: 3 days   Marinda Elk  [facility]  12/17/2020, 8:33 AM

## 2020-12-17 NOTE — Plan of Care (Signed)

## 2020-12-17 NOTE — TOC Initial Note (Signed)
Transition of Care [facility]) - Initial/Assessment Note    Patient Details  Name: Nicole Hughes MRN: [id number] Date of Birth: 07-03-1970  [facility]) CM/SW Contact:    Harriet Masson, RN Phone Number: 12/17/2020, 4:31 PM  Clinical Narrative:                 HH and DME ordered. Left message on patient's phone. Awaiting on return call          Expected Discharge Plan and Services                          Home with Home health                     Activities of Daily Living Home Assistive Devices/Equipment: Cane (specify quad or straight), Walker (specify type), Wheelchair ADL Screening (condition at time of admission) Patient's cognitive ability adequate to safely complete daily activities?: No Is the patient deaf or have difficulty hearing?: No Does the patient have difficulty seeing, even when wearing glasses/contacts?: No Does the patient have difficulty concentrating, remembering, or making decisions?: No Patient able to express need for assistance with ADLs?: Yes Does the patient have difficulty dressing or bathing?: No Independently performs ADLs?: Yes (appropriate for developmental age) Does the patient have difficulty walking or climbing stairs?: Yes Weakness of Legs: Both Weakness of Arms/Hands: Both  Permission Sought/Granted                  Emotional Assessment              Admission diagnosis:  Alcohol withdrawal (HCC) [F10.939] Hypokalemia [E87.6] Metabolic acidosis, increased anion gap (IAG) [E87.29] Hypomagnesemia [E83.42] Paresthesia and pain of extremity [R20.2, M79.609] Alcohol withdrawal syndrome with complication [facility]) [F10.939] Patient Active Problem List   Diagnosis Date Noted   High anion gap metabolic acidosis 12/14/2020   Fall at home, initial encounter 12/14/2020   Falls frequently 12/14/2020   Syncope 12/14/2020   Folic acid deficiency 12/14/2020   Acute diastolic CHF (congestive heart failure)  (HCC) 12/14/2020   Alcohol withdrawal (HCC) 07/08/2020   Prolonged QT interval 05/19/2020   Hypomagnesemia 12/24/2019   Dysphagia 05/31/2019   Anemia 03/31/2018   Appetite loss 03/08/2018   Myofascial pain 03/08/2018   IBS (irritable bowel syndrome) 03/08/2018   Alcoholic hepatitis without ascites 03/05/2018   Elevated LFTs 03/03/2018   Achilles tendonitis 01/28/2018   Abnormal cervical Papanicolaou smear 09/29/2017   Insomnia 09/29/2017   Chest wall pain 05/25/2017   Elevated lipase 05/25/2017   Depression with anxiety 02/23/2017   MRSA carrier 02/23/2017   Hypokalemia 09/24/2016   Alcohol abuse 09/22/2016   H/O acute pancreatitis 09/22/2016   GERD (gastroesophageal reflux disease) 04/09/2015   Epigastric pain 01/17/2014   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/24/2012   Chest pain 08/20/2012   Stress reaction 07/23/2012   Essential hypertension 06/09/2012   History of pulmonary embolism 06/09/2012   Preop cardiovascular exam 06/08/2012   Dysmenorrhea    Dyspareunia    Pelvic pain    PCP:  Judy Pimple, MD Pharmacy:   [facility] [address] [place]), [place] - [address] [place] [address] Phone: [phone] Fax: [phone]  [facility]) - [place], [place] - [address] [facility] [address] [place] [address] Phone: [phone] Fax: [phone]     Social Determinants of  Health (SDOH) Interventions    Readmission Risk Interventions No flowsheet data found.

## 2020-12-17 NOTE — Plan of Care (Signed)
  Problem: Education: Goal: Knowledge of General Education information will improve Description: Including pain rating scale, medication(s)/side effects and non-pharmacologic comfort measures 12/17/2020 1209 by Jerral Ralph, RN Outcome: Progressing 12/17/2020 1111 by Jerral Ralph, RN Outcome: Progressing   Problem: Health Behavior/Discharge Planning: Goal: Ability to manage health-related needs will improve 12/17/2020 1209 by Jerral Ralph, RN Outcome: Progressing 12/17/2020 1111 by Jerral Ralph, RN Outcome: Progressing   Problem: Clinical Measurements: Goal: Ability to maintain clinical measurements within normal limits will improve 12/17/2020 1209 by Jerral Ralph, RN Outcome: Progressing 12/17/2020 1111 by Jerral Ralph, RN Outcome: Progressing Goal: Will remain free from infection 12/17/2020 1209 by Jerral Ralph, RN Outcome: Progressing 12/17/2020 1111 by Jerral Ralph, RN Outcome: Progressing Goal: Diagnostic test results will improve 12/17/2020 1209 by Jerral Ralph, RN Outcome: Progressing 12/17/2020 1111 by Jerral Ralph, RN Outcome: Progressing Goal: Respiratory complications will improve 12/17/2020 1209 by Jerral Ralph, RN Outcome: Progressing 12/17/2020 1111 by Jerral Ralph, RN Outcome: Progressing Goal: Cardiovascular complication will be avoided 12/17/2020 1209 by Jerral Ralph, RN Outcome: Progressing 12/17/2020 1111 by Jerral Ralph, RN Outcome: Progressing   Problem: Activity: Goal: Risk for activity intolerance will decrease 12/17/2020 1209 by Jerral Ralph, RN Outcome: Progressing 12/17/2020 1111 by Jerral Ralph, RN Outcome: Progressing   Problem: Nutrition: Goal: Adequate nutrition will be maintained 12/17/2020 1209 by Jerral Ralph, RN Outcome: Progressing 12/17/2020 1111 by Jerral Ralph, RN Outcome: Progressing   Problem: Coping: Goal: Level of anxiety will decrease 12/17/2020 1209 by Jerral Ralph, RN Outcome:  Progressing 12/17/2020 1111 by Jerral Ralph, RN Outcome: Progressing   Problem: Elimination: Goal: Will not experience complications related to bowel motility 12/17/2020 1209 by Jerral Ralph, RN Outcome: Progressing 12/17/2020 1111 by Jerral Ralph, RN Outcome: Progressing Goal: Will not experience complications related to urinary retention 12/17/2020 1209 by Jerral Ralph, RN Outcome: Progressing 12/17/2020 1111 by Jerral Ralph, RN Outcome: Progressing   Problem: Pain Managment: Goal: General experience of comfort will improve 12/17/2020 1209 by Jerral Ralph, RN Outcome: Progressing 12/17/2020 1111 by Jerral Ralph, RN Outcome: Progressing   Problem: Safety: Goal: Ability to remain free from injury will improve 12/17/2020 1209 by Jerral Ralph, RN Outcome: Progressing 12/17/2020 1111 by Jerral Ralph, RN Outcome: Progressing   Problem: Skin Integrity: Goal: Risk for impaired skin integrity will decrease 12/17/2020 1209 by Jerral Ralph, RN Outcome: Progressing 12/17/2020 1111 by Jerral Ralph, RN Outcome: Progressing

## 2020-12-18 LAB — BASIC METABOLIC PANEL
Anion gap: 11 (ref 5–15)
BUN: <5 mg/dL — ABNORMAL LOW (ref 6–20)
CO2: 20 mmol/L — ABNORMAL LOW (ref 22–32)
Calcium: 8.7 mg/dL — ABNORMAL LOW (ref 8.9–10.3)
Chloride: 104 mmol/L (ref 98–111)
Creatinine, Ser: 0.61 mg/dL (ref 0.44–1.00)
GFR, Estimated: >60 mL/min (ref >60)
Glucose, Bld: 111 mg/dL — ABNORMAL HIGH (ref 70–99)
Potassium: 3.2 mmol/L — ABNORMAL LOW (ref 3.5–5.1)
Sodium: 135 mmol/L (ref 135–145)

## 2020-12-18 LAB — PHOSPHORUS
Phosphorus: <1 mg/dL — CL (ref 2.5–4.6)
Phosphorus: 2.3 mg/dL — ABNORMAL LOW (ref 2.5–4.6)

## 2020-12-18 LAB — MAGNESIUM: Magnesium: 1.1 mg/dL — ABNORMAL LOW (ref 1.7–2.4)

## 2020-12-18 LAB — AMMONIA: Ammonia: 67 umol/L — ABNORMAL HIGH (ref 9–35)

## 2020-12-18 MED ORDER — POTASSIUM CHLORIDE CRYS ER 20 MEQ PO TBCR
40.0000 meq | EXTENDED_RELEASE_TABLET | Freq: Two times a day (BID) | ORAL | Status: DC
  Filled 2020-12-18 (×2): qty 2

## 2020-12-18 MED ORDER — LORAZEPAM 2 MG/ML IJ SOLN
2.0000 mg | INTRAMUSCULAR | Status: AC
  Administered 2020-12-18: 2 mg via INTRAVENOUS
  Filled 2020-12-18: qty 1

## 2020-12-18 MED ORDER — MAGNESIUM OXIDE -MG SUPPLEMENT 400 (240 MG) MG PO TABS
400.0000 mg | ORAL_TABLET | Freq: Three times a day (TID) | ORAL | Status: DC
  Administered 2020-12-18 (×2): 400 mg via ORAL
  Filled 2020-12-18 (×3): qty 1

## 2020-12-18 MED ORDER — POTASSIUM CHLORIDE CRYS ER 20 MEQ PO TBCR
40.0000 meq | EXTENDED_RELEASE_TABLET | Freq: Two times a day (BID) | ORAL | Status: DC
  Administered 2020-12-18 (×2): 40 meq via ORAL
  Filled 2020-12-18 (×2): qty 2

## 2020-12-18 MED ORDER — K PHOS MONO-SOD PHOS DI & MONO 155-852-130 MG PO TABS: 500.0000 mg | ORAL_TABLET | Freq: Once | ORAL | Status: DC

## 2020-12-18 MED ORDER — MAGNESIUM OXIDE -MG SUPPLEMENT 400 (240 MG) MG PO TABS: 400.0000 mg | ORAL_TABLET | Freq: Two times a day (BID) | ORAL | Status: DC

## 2020-12-18 NOTE — Progress Notes (Signed)
[facility] PROGRESS NOTE    Progress Note  Nicole Hughes  [id number] DOB: [birth year] DOA: 12/14/2020 PCP: Abner Greenspan, MD     Brief Narrative:   Nicole Hughes is an 50 y.o. female past medical history significant for alcohol abuse anxiety/depression, chronic back pain, essential hypertension, remote history of DVT PE during pregnancy off anticoagulation comes into the emergency room due to frequent falls and leg weakness.  In the setting of alcohol abuse syncope work-up has been unrevealing.   Assessment/Plan:   High anion gap metabolic acidosis likely due to alcohol and starvation ketosis: Consumes alcohol daily anion gap was 24 on admission, glucose was 81. Was started on IV fluid hydration, her anion gap is closed bicarb greater than 20. Discontinue IV fluids.  Recurrent falls likely due to bilateral leg weakness in the setting of ETOH abuse: In the setting of alcohol abuse, she relates she hit her head and lost consciousness. Physical therapy and Occupational Therapy evaluated the patient and recommended home health PT. Relates lower extremity pain will need EMG as an outpatient  Alcohol abuse: On admission 46 her level, was started on thiamine and folate. CIWA protocol is high, will add Ativan as needed on top of protocol.  Electrolyte imbalance, hypokalemia/hypomagnesemia/hypophosphatemia: Secondary to alcohol abuse. Potassium still low but improving, continue replete orally. Continue replete magnesium orally.  Still low this morning.  Elevated ammonia level: Was started on lactulose, as her lactulose was elevated.  Elevated LFT's: Likely due to alcohol abuse and fatty liver. Right upper quadrant ultrasound no visible gallstone or gallbladder thickening. HIDA scan was done showed no acute cholecystitis. LFTs continue to improve  Chronic Pancytopenia : Likely due to alcohol abuse.  Chronic thrombocytopenia: In setting of alcohol  abuse.  Normocytic anemia: Currently on PPI transition to oral signs of oral bleeding hemoglobin has remained stable.  Chronic diastolic heart failure: Appears to be euvolemic.  Essential hypertension: Continue amlodipine and metoprolol.  Sinus tachycardia: Has improved with restarting of metoprolol.  DVT prophylaxis: lovenox Family Communication:none Status is: Inpatient  Remains inpatient appropriate because: High anion gap metabolic acidosis        Code Status:     Code Status Orders  (From admission, onward)           Start     Ordered   12/15/20 0545  Full code  Continuous        12/15/20 0544           Code Status History     Date Active Date Inactive Code Status Order ID Comments User Context   10/11/2020 0920 10/11/2020 2033 Full Code [id number]  Tacy Learn PA-C ED   07/08/2020 1503 07/12/2020 1843 Full Code [id number]  Lequita Halt, MD ED   07/08/2020 1157 07/08/2020 1503 Full Code [id number]  Domenic Moras, PA-C ED   05/19/2020 1359 05/22/2020 1506 Full Code [id number]  Norval Morton, MD ED   04/16/2020 1323 04/19/2020 1524 Full Code [id number]  Norval Morton, MD ED   12/24/2019 1606 12/26/2019 1547 Full Code [id number]  Harold Hedge, MD ED   03/03/2018 0014 03/05/2018 1739 Full Code [id number]  Rise Patience, MD ED   02/17/2017 0203 02/18/2017 2219 Full Code [id number]  Opyd, Ilene Qua, MD ED         IV Access:   Peripheral IV   Procedures and diagnostic studies:   NM Hepatobiliary Liver Func  Result Date: 12/16/2020 CLINICAL DATA:  Cholecystitis EXAM: NUCLEAR  MEDICINE HEPATOBILIARY IMAGING TECHNIQUE: Sequential images of the abdomen were obtained out to 60 minutes following intravenous administration of radiopharmaceutical. RADIOPHARMACEUTICALS:  7.9 mCi Tc-43m  Choletec IV COMPARISON:  None. FINDINGS: Prompt uptake and biliary excretion of activity by the liver is seen. Gallbladder activity is visualized, consistent with patency of cystic  duct. Biliary activity passes into small bowel, consistent with patent common bile duct. IMPRESSION: No evidence of acute cholecystitis. Electronically Signed   By: Yetta Glassman M.D.   On: 12/16/2020 13:01     Medical Consultants:   None.   Subjective:    Nicole Hughes was sleeping this morning relates she is not hungry.  Objective:    Vitals:   12/17/20 2025 12/18/20 0028 12/18/20 0414 12/18/20 0752  BP: 136/81 (!) 142/93 (!) 157/88 (!) 155/99  Pulse: (!) 105 (!) 103 (!) 109 100  Resp: 17 17 17 18   Temp: 98.8 F (37.1 C) 98.3 F (36.8 C) 99.3 F (37.4 C) 97.9 F (36.6 C)  TempSrc: Oral Oral Oral Oral  SpO2: 99% 99% 99% 99%   SpO2: 99 %   Intake/Output Summary (Last 24 hours) at 12/18/2020 0851 Last data filed at 12/17/2020 2000 Gross per 24 hour  Intake 120 ml  Output --  Net 120 ml    There were no vitals filed for this visit.  Exam: General exam: In no acute distress. Respiratory system: Good air movement and clear to auscultation. Cardiovascular system: S1 & S2 heard, RRR. No JVD. Gastrointestinal system: Abdomen is nondistended, soft and nontender.  Extremities: No pedal edema. Skin: No rashes, lesions or ulcers   Data Reviewed:    Labs: Basic Metabolic Panel: Recent Labs  Lab 12/14/20 1532 12/15/20 0235 12/15/20 0618 12/15/20 1801 12/16/20 0108 12/16/20 1853 12/17/20 0156 12/18/20 0228  NA 136  --  136 135 135  --  136 135  K 2.9*  --  3.1* 2.8* 2.9*   < > 3.0* 3.2*  CL 95*  --  103 101 102  --  105 104  CO2 17*  --  14* 19* 21*  --  22 20*  GLUCOSE 81  --  70 78 114*  --  141* 111*  BUN 7  --  6 <5* <5*  --  <5* <5*  CREATININE 0.72  --  0.91 0.61 0.67  --  0.56 0.61  CALCIUM 8.4*  --  7.5* 7.7* 7.8*  --  8.1* 8.7*  MG 1.3* 2.3 2.0  --   --   --  1.2* 1.1*  PHOS  --   --  1.4*  --   --   --  <1.0* 2.3*   < > = values in this interval not displayed.    GFR CrCl cannot be calculated (Unknown ideal weight.). Liver Function  Tests: Recent Labs  Lab 12/14/20 1532 12/15/20 0618 12/16/20 0108 12/17/20 0156  AST 217* 169* 213* 140*  ALT 34 29 29 27   ALKPHOS 81 70 70 73  BILITOT 2.7* 3.8* 3.0* 2.7*  PROT 7.2 6.1* 5.5* 5.5*  ALBUMIN 3.8 3.3* 3.0* 2.9*    Recent Labs  Lab 12/14/20 1532  LIPASE 129*    Recent Labs  Lab 12/14/20 1855 12/16/20 0108 12/17/20 0156 12/18/20 0228  AMMONIA 61* 67* 63* 67*    Coagulation profile Recent Labs  Lab 12/14/20 1725  INR 1.1    COVID-19 Labs  No results for input(s): DDIMER, FERRITIN, LDH, CRP in the last 72 hours.   Lab Results  Component Value Date   SARSCOV2NAA NEGATIVE 07/08/2020   SARSCOV2NAA NEGATIVE 05/19/2020   SARSCOV2NAA NEGATIVE 12/24/2019   [place] NEGATIVE 11/27/2019    CBC: Recent Labs  Lab 12/14/20 1458 12/15/20 0618 12/16/20 0108 12/17/20 0156  WBC 4.7 3.6* 2.7* 2.9*  HGB 10.6* 8.4* 7.9* 8.0*  HCT 33.1* 25.5* 23.8* 24.0*  MCV 95.1 97.3 95.6 96.4  PLT 164 108* 89* 112*    Cardiac Enzymes: No results for input(s): CKTOTAL, CKMB, CKMBINDEX, TROPONINI in the last 168 hours. BNP (last 3 results) No results for input(s): PROBNP in the last 8760 hours. CBG: No results for input(s): GLUCAP in the last 168 hours. D-Dimer: No results for input(s): DDIMER in the last 72 hours.  Hgb A1c: No results for input(s): HGBA1C in the last 72 hours. Lipid Profile: No results for input(s): CHOL, HDL, LDLCALC, TRIG, CHOLHDL, LDLDIRECT in the last 72 hours. Thyroid function studies: No results for input(s): TSH, T4TOTAL, T3FREE, THYROIDAB in the last 72 hours.  Invalid input(s): FREET3 Anemia work up: No results for input(s): VITAMINB12, FOLATE, FERRITIN, TIBC, IRON, RETICCTPCT in the last 72 hours.  Sepsis Labs: Recent Labs  Lab 12/14/20 1458 12/14/20 1725 12/14/20 2300 12/15/20 0140 12/15/20 0618 12/16/20 0108 12/17/20 0156  WBC 4.7  --   --   --  3.6* 2.7* 2.9*  LATICACIDVEN  --  3.9* 1.6 1.0  --   --   --      Microbiology Recent Results (from the past 240 hour(s))  Culture, blood (routine x 2)     Status: None (Preliminary result)   Collection Time: 12/16/20  1:08 AM   Specimen: BLOOD LEFT HAND  Result Value Ref Range Status   Specimen Description BLOOD LEFT HAND  Final   Special Requests   Final    BOTTLES DRAWN AEROBIC ONLY Blood Culture results may not be optimal due to an inadequate volume of blood received in culture bottles   Culture   Final    NO GROWTH 1 DAY Performed at [facility], [place] [address], [place], [place] [address]    Report Status PENDING  Incomplete  Culture, blood (routine x 2)     Status: None (Preliminary result)   Collection Time: 12/16/20  1:09 AM   Specimen: BLOOD RIGHT HAND  Result Value Ref Range Status   Specimen Description BLOOD RIGHT HAND  Final   Special Requests   Final    BOTTLES DRAWN AEROBIC AND ANAEROBIC Blood Culture adequate volume   Culture   Final    NO GROWTH 1 DAY Performed at [facility], [place] [address], [place], [place] [address]    Report Status PENDING  Incomplete     Medications:    amLODipine  10 mg Oral Daily   cyanocobalamin  1,000 mcg Intramuscular Daily   enoxaparin (LOVENOX) injection  40 mg Subcutaneous [id number]   folic acid  1 mg Oral Daily   lactulose  20 g Oral TID   LORazepam  0-4 mg Intravenous Q8H   LORazepam  2 mg Intravenous Q4H   magnesium oxide  400 mg Oral BID   metoprolol succinate  100 mg Oral Daily   multivitamin with minerals  1 tablet Oral Daily   pantoprazole  40 mg Oral Daily   phosphorus  500 mg Oral TID   phosphorus  500 mg Oral Once   potassium chloride  40 mEq Oral BID   sertraline  100 mg Oral Daily   thiamine  100  mg Oral Daily   Or   thiamine  100 mg Intravenous Daily   Continuous Infusions:      LOS: 4 days   Marinda Elk  [facility]  12/18/2020, 8:51 AM

## 2020-12-18 NOTE — TOC Progression Note (Addendum)
Transition of Care [facility]) - Progression Note    Patient Details  Name: Nicole Hughes MRN: [id number] Date of Birth: [birth year]  Transition of Care [facility]) CM/SW Contact  Lawerance Sabal, RN Phone Number: 12/18/2020, 1:57 PM  Clinical Narrative:    Spoke w aptient at bedside. Patient not very participatory in conversation. Familiar w patient from previous similar admissions. Explained to patient that we will try to obtain [facility] services, but due to a combination of her payor source and continued alcohol use that a [facility] agency may not be willing to accept. We spoke of a referral to an outpt office if this is the case. She states that she can "figure out" transportation if needed.  She states that she has a RW and a 3/1 at home already.   [facility]- declined [facility]- declined [facility]- pending  12/14 0730 [facility] with 2-3 day delay in [facility].     Expected Discharge Plan: Home/Self Care Barriers to Discharge: Continued Medical Work up  Expected Discharge Plan and Services Expected Discharge Plan: Home/Self Care   Discharge Planning Services: CM Consult   Living arrangements for the past 2 months: Single Family Home                                       Social Determinants of Health (SDOH) Interventions    Readmission Risk Interventions No flowsheet data found.

## 2020-12-18 NOTE — Progress Notes (Signed)
PT Cancellation Note  Patient Details Name: Nicole Hughes MRN: [id number] DOB: September 02, 1970   Cancelled Treatment:    Reason Eval/Treat Not Completed: Medical issues which prohibited therapy  Per RN, pt in withdrawal and currently hallucinating.    Jerolyn Center, PT Acute Rehabilitation Services  Pager [phone] Office [phone]   Zena Amos 12/18/2020, 11:58 AM

## 2020-12-18 NOTE — Plan of Care (Signed)

## 2020-12-19 ENCOUNTER — Inpatient Hospital Stay (HOSPITAL_COMMUNITY): Payer: BC Managed Care – PPO

## 2020-12-19 LAB — RENAL FUNCTION PANEL
Albumin: 3 g/dL — ABNORMAL LOW (ref 3.5–5.0)
Anion gap: 7 (ref 5–15)
BUN: <5 mg/dL — ABNORMAL LOW (ref 6–20)
CO2: 22 mmol/L (ref 22–32)
Calcium: 8.2 mg/dL — ABNORMAL LOW (ref 8.9–10.3)
Chloride: 105 mmol/L (ref 98–111)
Creatinine, Ser: 0.55 mg/dL (ref 0.44–1.00)
GFR, Estimated: >60 mL/min (ref >60)
Glucose, Bld: 100 mg/dL — ABNORMAL HIGH (ref 70–99)
Phosphorus: 2.4 mg/dL — ABNORMAL LOW (ref 2.5–4.6)
Potassium: 3.7 mmol/L (ref 3.5–5.1)
Sodium: 134 mmol/L — ABNORMAL LOW (ref 135–145)

## 2020-12-19 LAB — VITAMIN B1: Vitamin B1 (Thiamine): 80.1 nmol/L (ref 66.5–200.0)

## 2020-12-19 LAB — MAGNESIUM
Magnesium: 1.4 mg/dL — ABNORMAL LOW (ref 1.7–2.4)
Magnesium: 1.2 mg/dL — ABNORMAL LOW (ref 1.7–2.4)

## 2020-12-19 LAB — PHOSPHORUS: Phosphorus: 2.4 mg/dL — ABNORMAL LOW (ref 2.5–4.6)

## 2020-12-19 MED ORDER — POTASSIUM CHLORIDE CRYS ER 20 MEQ PO TBCR
40.0000 meq | EXTENDED_RELEASE_TABLET | Freq: Two times a day (BID) | ORAL | Status: AC
  Administered 2020-12-19 (×2): 40 meq via ORAL
  Filled 2020-12-19 (×2): qty 2

## 2020-12-19 MED ORDER — ONDANSETRON HCL 4 MG/2ML IJ SOLN
4.0000 mg | Freq: Once | INTRAMUSCULAR | Status: AC
  Administered 2020-12-19: 22:00:00 4 mg via INTRAVENOUS
  Filled 2020-12-19: qty 2

## 2020-12-19 MED ORDER — LORAZEPAM 2 MG/ML IJ SOLN
0.0000 mg | Freq: Three times a day (TID) | INTRAMUSCULAR | Status: DC
  Administered 2020-12-19: 22:00:00 4 mg via INTRAVENOUS
  Administered 2020-12-20: 2 mg via INTRAVENOUS
  Filled 2020-12-19: qty 2
  Filled 2020-12-19: qty 1

## 2020-12-19 MED ORDER — MAGNESIUM OXIDE -MG SUPPLEMENT 400 (240 MG) MG PO TABS
400.0000 mg | ORAL_TABLET | Freq: Three times a day (TID) | ORAL | Status: AC
  Administered 2020-12-19 (×3): 400 mg via ORAL
  Filled 2020-12-19 (×2): qty 1

## 2020-12-19 MED ORDER — K PHOS MONO-SOD PHOS DI & MONO 155-852-130 MG PO TABS
500.0000 mg | ORAL_TABLET | Freq: Two times a day (BID) | ORAL | Status: AC
  Administered 2020-12-19 (×2): 500 mg via ORAL
  Filled 2020-12-19: qty 2

## 2020-12-19 NOTE — Progress Notes (Signed)
[facility] PROGRESS NOTE    Progress Note  Nicole Hughes  [id number] DOB: February 14, 1970 DOA: 12/14/2020 PCP: Abner Greenspan, MD     Brief Narrative:   Nicole Hughes is an 50 y.o. female past medical history significant for alcohol abuse anxiety/depression, chronic back pain, essential hypertension, remote history of DVT PE during pregnancy off anticoagulation comes into the emergency room due to frequent falls and leg weakness.  In the setting of alcohol abuse syncope work-up has been unrevealing.   Assessment/Plan:   High anion gap metabolic acidosis likely due to alcohol and starvation ketosis: Consumes alcohol daily anion gap was 24 on admission, glucose was 81. Was started on IV fluid hydration, her anion gap is closed bicarb greater than 20. Discontinue IV fluids.  Recurrent falls likely due to bilateral leg weakness in the setting of ETOH abuse: In the setting of alcohol abuse, she relates she hit her head and lost consciousness. Physical therapy and Occupational Therapy evaluated the patient and recommended home health PT. Relates lower extremity pain will need EMG as an outpatient. She fell in the bathroom on 12/19/2020 at 4 AM, see unwitnessed fall below for further details no acute fractures.  Alcohol abuse: On admission 46 her level, was started on thiamine and folate. CIWA protocol is high, will add Ativan as needed on top of protocol.  Electrolyte imbalance, hypokalemia/hypomagnesemia/hypophosphatemia: Secondary to alcohol abuse. Potassium has improved, she continues to have a low magnesium check a phosphorus level and repeat phosphorus.  Recheck in the morning.  Elevated ammonia level: Was started on lactulose, as her lactulose was elevated.  Elevated LFT's: Likely due to alcohol abuse and fatty liver. Right upper quadrant ultrasound no visible gallstone or gallbladder thickening. HIDA scan was done showed no acute cholecystitis. LFTs continue to  improve  Chronic Pancytopenia : Likely due to alcohol abuse.  Chronic thrombocytopenia: In setting of alcohol abuse.  Normocytic anemia: Currently on PPI transition to oral signs of oral bleeding hemoglobin has remained stable.  Chronic diastolic heart failure: Appears to be euvolemic.  Essential hypertension: Continue amlodipine and metoprolol.  Sinus tachycardia: Has improved with restarting of metoprolol.  Unwitnessed fall: X-ray of the left and right knee was negative for acute fracture. Pelvic x-ray showed no fracture.  DVT prophylaxis: lovenox Family Communication:none Status is: Inpatient  Remains inpatient appropriate because: High anion gap metabolic acidosis        Code Status:     Code Status Orders  (From admission, onward)           Start     Ordered   12/15/20 0545  Full code  Continuous        12/15/20 0544           Code Status History     Date Active Date Inactive Code Status Order ID Comments User Context   10/11/2020 0920 10/11/2020 2033 Full Code [id number]  Tacy Learn PA-C ED   07/08/2020 1503 07/12/2020 1843 Full Code [id number]  Lequita Halt, MD ED   07/08/2020 1157 07/08/2020 1503 Full Code [id number]  Domenic Moras, PA-C ED   05/19/2020 1359 05/22/2020 1506 Full Code [id number]  Norval Morton, MD ED   04/16/2020 1323 04/19/2020 1524 Full Code [id number]  Norval Morton, MD ED   12/24/2019 1606 12/26/2019 1547 Full Code [id number]  Harold Hedge, MD ED   03/03/2018 0014 03/05/2018 1739 Full Code [id number]  Rise Patience, MD ED   02/17/2017 [phone] 02/18/2017 2219  Full Code [id number]  Vianne Bulls, MD ED         IV Access:   Peripheral IV   Procedures and diagnostic studies:   DG Pelvis 1-2 Views  Result Date: 12/19/2020 CLINICAL DATA:  50 year old female with history of trauma from multiple falls. Pain in both hips. EXAM: PELVIS - 1-2 VIEW COMPARISON:  None. FINDINGS: There is no evidence of pelvic fracture or  diastasis. No pelvic bone lesions are seen. IMPRESSION: Negative. Electronically Signed   By: Vinnie Langton M.D.   On: 12/19/2020 06:00   DG Knee 1-2 Views Left  Result Date: 12/19/2020 CLINICAL DATA:  50 year old female with history of trauma from multiple falls. Pain in both knees. EXAM: LEFT KNEE - 1-2 VIEW COMPARISON:  No priors. FINDINGS: No evidence of fracture, dislocation, or joint effusion. No evidence of arthropathy or other focal bone abnormality. Soft tissues are unremarkable. IMPRESSION: Negative. Electronically Signed   By: Vinnie Langton M.D.   On: 12/19/2020 05:59   DG Knee 1-2 Views Right  Result Date: 12/19/2020 CLINICAL DATA:  50 year old female with history of trauma from multiple falls. Pain in both knees. EXAM: RIGHT KNEE - 1-2 VIEW COMPARISON:  None. FINDINGS: No evidence of fracture, dislocation, or joint effusion. No evidence of arthropathy or other focal bone abnormality. Soft tissues are unremarkable. IMPRESSION: Negative. Electronically Signed   By: Vinnie Langton M.D.   On: 12/19/2020 05:59     Medical Consultants:   None.   Subjective:    Patrick North tolerating the diet.  Objective:    Vitals:   12/18/20 2003 12/19/20 0013 12/19/20 0431 12/19/20 0728  BP: (!) 135/91 123/79 122/83 128/76  Pulse: 93 97 (!) 102 94  Resp: 17 18 18 16   Temp: 98.1 F (36.7 C) 98.6 F (37 C) 98.5 F (36.9 C) 98.5 F (36.9 C)  TempSrc: Oral Oral Oral Oral  SpO2: 99% 98% 99% 97%   SpO2: 97 %   Intake/Output Summary (Last 24 hours) at 12/19/2020 1012 Last data filed at 12/18/2020 2000 Gross per 24 hour  Intake 120 ml  Output --  Net 120 ml    There were no vitals filed for this visit.  Exam: General exam: In no acute distress. Respiratory system: Good air movement and clear to auscultation. Cardiovascular system: S1 & S2 heard, RRR. No JVD. Gastrointestinal system: Abdomen is nondistended, soft and nontender.  Extremities: Straight leg raise and  external rotation of the hip on both hips are negative, examination of the knee is unremarkable. Skin: No rashes, lesions or ulcers Psychiatry: Judgement and insight appear normal. Mood & affect appropriate. Data Reviewed:    Labs: Basic Metabolic Panel: Recent Labs  Lab 12/15/20 0235 12/15/20 0618 12/15/20 1801 12/16/20 0108 12/16/20 1853 12/17/20 0156 12/18/20 0228 12/19/20 0123  NA  --  136 135 135  --  136 135 134*  K  --  3.1* 2.8* 2.9*   < > 3.0* 3.2* 3.7  CL  --  103 101 102  --  105 104 105  CO2  --  14* 19* 21*  --  22 20* 22  GLUCOSE  --  70 78 114*  --  141* 111* 100*  BUN  --  6 <5* <5*  --  <5* <5* <5*  CREATININE  --  0.91 0.61 0.67  --  0.56 0.61 0.55  CALCIUM  --  7.5* 7.7* 7.8*  --  8.1* 8.7* 8.2*  MG 2.3 2.0  --   --   --  1.2* 1.1* 1.4*  PHOS  --  1.4*  --   --   --  <1.0* 2.3* 2.4*   < > = values in this interval not displayed.    GFR CrCl cannot be calculated (Unknown ideal weight.). Liver Function Tests: Recent Labs  Lab 12/14/20 1532 12/15/20 0618 12/16/20 0108 12/17/20 0156 12/19/20 0123  AST 217* 169* 213* 140*  --   ALT 34 29 29 27   --   ALKPHOS 81 70 70 73  --   BILITOT 2.7* 3.8* 3.0* 2.7*  --   PROT 7.2 6.1* 5.5* 5.5*  --   ALBUMIN 3.8 3.3* 3.0* 2.9* 3.0*    Recent Labs  Lab 12/14/20 1532  LIPASE 129*    Recent Labs  Lab 12/14/20 1855 12/16/20 0108 12/17/20 0156 12/18/20 0228  AMMONIA 61* 67* 63* 67*    Coagulation profile Recent Labs  Lab 12/14/20 1725  INR 1.1    COVID-19 Labs  No results for input(s): DDIMER, FERRITIN, LDH, CRP in the last 72 hours.   Lab Results  Component Value Date   SARSCOV2NAA NEGATIVE 07/08/2020   SARSCOV2NAA NEGATIVE 05/19/2020   SARSCOV2NAA NEGATIVE 12/24/2019   SARSCOV2NAA NEGATIVE 11/27/2019    CBC: Recent Labs  Lab 12/14/20 1458 12/15/20 0618 12/16/20 0108 12/17/20 0156  WBC 4.7 3.6* 2.7* 2.9*  HGB 10.6* 8.4* 7.9* 8.0*  HCT 33.1* 25.5* 23.8* 24.0*  MCV 95.1 97.3  95.6 96.4  PLT 164 108* 89* 112*    Cardiac Enzymes: No results for input(s): CKTOTAL, CKMB, CKMBINDEX, TROPONINI in the last 168 hours. BNP (last 3 results) No results for input(s): PROBNP in the last 8760 hours. CBG: No results for input(s): GLUCAP in the last 168 hours. D-Dimer: No results for input(s): DDIMER in the last 72 hours.  Hgb A1c: No results for input(s): HGBA1C in the last 72 hours. Lipid Profile: No results for input(s): CHOL, HDL, LDLCALC, TRIG, CHOLHDL, LDLDIRECT in the last 72 hours. Thyroid function studies: No results for input(s): TSH, T4TOTAL, T3FREE, THYROIDAB in the last 72 hours.  Invalid input(s): FREET3 Anemia work up: No results for input(s): VITAMINB12, FOLATE, FERRITIN, TIBC, IRON, RETICCTPCT in the last 72 hours.  Sepsis Labs: Recent Labs  Lab 12/14/20 1458 12/14/20 1725 12/14/20 2300 12/15/20 0140 12/15/20 0618 12/16/20 0108 12/17/20 0156  WBC 4.7  --   --   --  3.6* 2.7* 2.9*  LATICACIDVEN  --  3.9* 1.6 1.0  --   --   --     Microbiology Recent Results (from the past 240 hour(s))  Culture, blood (routine x 2)     Status: None (Preliminary result)   Collection Time: 12/16/20  1:08 AM   Specimen: BLOOD LEFT HAND  Result Value Ref Range Status   Specimen Description BLOOD LEFT HAND  Final   Special Requests   Final    BOTTLES DRAWN AEROBIC ONLY Blood Culture results may not be optimal due to an inadequate volume of blood received in culture bottles   Culture   Final    NO GROWTH 3 DAYS Performed at [facility], [address] [place], [place]    Report Status PENDING  Incomplete  Culture, blood (routine x 2)     Status: None (Preliminary result)   Collection Time: 12/16/20  1:09 AM   Specimen: BLOOD RIGHT HAND  Result Value Ref Range Status   Specimen Description BLOOD RIGHT HAND  Final   Special Requests   Final  BOTTLES DRAWN AEROBIC AND ANAEROBIC Blood Culture adequate volume   Culture   Final    NO  GROWTH 3 DAYS Performed at [facility], [place] [address], [place], [place] [address]    Report Status PENDING  Incomplete     Medications:    amLODipine  10 mg Oral Daily   cyanocobalamin  1,000 mcg Intramuscular Daily   enoxaparin (LOVENOX) injection  40 mg Subcutaneous [id number]   folic acid  1 mg Oral Daily   lactulose  20 g Oral TID   magnesium oxide  400 mg Oral TID   metoprolol succinate  100 mg Oral Daily   multivitamin with minerals  1 tablet Oral Daily   pantoprazole  40 mg Oral Daily   phosphorus  500 mg Oral Once   sertraline  100 mg Oral Daily   thiamine  100 mg Oral Daily   Or   thiamine  100 mg Intravenous Daily   Continuous Infusions:      LOS: 5 days   Charlynne Cousins  [facility]  12/19/2020, 10:12 AM

## 2020-12-19 NOTE — Progress Notes (Signed)
Called to room by nurse tech after patient reported that she fell in the bathroom "about an hour ago." Patient denies injury, denies hitting her head. Patient does not want family notified at this time. MD made aware of incident. Continue POC at this time.

## 2020-12-19 NOTE — Progress Notes (Signed)
Physical Therapy Treatment Patient Details Name: Nicole Hughes MRN: [id number] DOB: 01/16/1970 Today's Date: 12/19/2020   History of Present Illness THis 50 y.o. female admitted with recurrent falls (~ 8 falls in 6 weeks).  She does endorse hitting her head with + LOC. She also reported midsternal sharp pain and lt shoulder pain. MRI of brain with no acute intracranial abnormality.  Neurology consulsted and work up underway with current dx: ETOH withdrawal, hypomagnesia, hypokalemia; 12/14 fell in bathroom (per pt was alone and not using RW)   PMH includes:  ETOH abuse (drinks a pint of ETOH daily), anxiety, cardiomyopathy, chronic LBP, depression, GERD, Migraine, h/o PE    PT Comments    Patient reported falling in bathroom last night when walked by herself and without RW. Able to ambulate max of 65 ft with RW before legs became shakey and she felt she could walk no farther. Educated on safe use of RW (proper proximity, weight-bearing through UEs) and gait improved today compared to 12/15/20.     Recommendations for follow up therapy are one component of a multi-disciplinary discharge planning process, led by the attending physician.  Recommendations may be updated based on patient status, additional functional criteria and insurance authorization.  Follow Up Recommendations  Home health PT (noted she may not be accepted by [facility] agencies and may be referred to OPPT)     Assistance Recommended at Discharge Intermittent Supervision/Assistance  Equipment Recommendations  None recommended by PT    Recommendations for Other Services       Precautions / Restrictions Precautions Precautions: Fall Precaution Comments: reports her legs just give out and she falls     Mobility  Bed Mobility               General bed mobility comments: seated EOB taking medicine on arrival    Transfers Overall transfer level: Needs assistance Equipment used: Rolling walker (2 wheels) Transfers:  Sit to/from Stand Sit to Stand: Min assist           General transfer comment: from EOB minguard assist; from standard toilet with grab bar min assist due to leg weakness    Ambulation/Gait Ambulation/Gait assistance: Min guard Gait Distance (Feet): 65 Feet (15 to bathroom, then 65 in room.) Assistive device: Rolling walker (2 wheels) Gait Pattern/deviations: Step-to pattern;Decreased step length - right;Decreased step length - left;Knee flexed in stance - right;Knee flexed in stance - left   Gait velocity interpretation: 1.31 - 2.62 ft/sec, indicative of limited community ambulator   General Gait Details: better dynamics to her gait pattern today compared to 12/10 when she had knees hyperextended with shuffling gait.   Stairs             Wheelchair Mobility    Modified Rankin (Stroke Patients Only)       Balance Overall balance assessment: Needs assistance Sitting-balance support: No upper extremity supported;Feet supported Sitting balance-Leahy Scale: Fair     Standing balance support: Bilateral upper extremity supported;Reliant on assistive device for balance Standing balance-Leahy Scale: Poor                              Cognition Arousal/Alertness: Awake/alert Behavior During Therapy: Flat affect Overall Cognitive Status: No family/caregiver present to determine baseline cognitive functioning  General Comments: followed all simple commands        Exercises      General Comments General comments (skin integrity, edema, etc.): pt able to don her pants to her knees and socks and shoes in sitting with good balance. ONce standing able to release walker and pull pants the rest of the way up without LOB.      Pertinent Vitals/Pain Pain Assessment: Faces Faces Pain Scale: Hurts little more Pain Location: bilat LEs Pain Descriptors / Indicators: Pins and needles Pain Intervention(s): Limited  activity within patient's tolerance    Home Living                          Prior Function            PT Goals (current goals can now be found in the care plan section) Acute Rehab PT Goals Patient Stated Goal: to have less pain in her legs PT Goal Formulation: With patient Time For Goal Achievement: 12/30/20 Potential to Achieve Goals: Good Progress towards PT goals: Progressing toward goals    Frequency    Min 3X/week      PT Plan Current plan remains appropriate    Co-evaluation              AM-PAC PT "6 Clicks" Mobility   Outcome Measure  Help needed turning from your back to your side while in a flat bed without using bedrails?: None Help needed moving from lying on your back to sitting on the side of a flat bed without using bedrails?: None Help needed moving to and from a bed to a chair (including a wheelchair)?: A Little Help needed standing up from a chair using your arms (e.g., wheelchair or bedside chair)?: A Little Help needed to walk in hospital room?: A Little Help needed climbing 3-5 steps with a railing? : Total 6 Click Score: 18    End of Session Equipment Utilized During Treatment: Gait belt Activity Tolerance: Patient tolerated treatment well Patient left: in bed;with call bell/phone within reach;with bed alarm set Nurse Communication: Mobility status;Other (comment) (at EOB eating breakfast with bed alarm set) PT Visit Diagnosis: Repeated falls (R29.6)     Time: 8811-0315 PT Time Calculation (min) (ACUTE ONLY): 21 min  Charges:  $Gait Training: 8-22 mins                      Jerolyn Center, PT Acute Rehabilitation Services  Pager [phone] Office [phone]    Zena Amos 12/19/2020, 11:17 AM

## 2020-12-19 NOTE — Progress Notes (Signed)
HOSPITAL MEDICINE OVERNIGHT EVENT NOTE    Nursing requesting antiemetic for this patient.  Chart reviewed, patient has a documented problem of prolonged QT.  Most recent EKG reviewed revealing a slightly prolonged Qtc  of 495.   We will administer a one-time dose of 4 mg of intravenous Zofran.  Patient is currently being monitored on telemetry.  Order repeat EKG in the morning.  Marinda Elk  MD [facility]

## 2020-12-19 NOTE — Plan of Care (Signed)

## 2020-12-20 ENCOUNTER — Other Ambulatory Visit (HOSPITAL_COMMUNITY): Payer: Self-pay

## 2020-12-20 LAB — BASIC METABOLIC PANEL
Anion gap: 9 (ref 5–15)
BUN: <5 mg/dL — ABNORMAL LOW (ref 6–20)
CO2: 19 mmol/L — ABNORMAL LOW (ref 22–32)
Calcium: 8.3 mg/dL — ABNORMAL LOW (ref 8.9–10.3)
Chloride: 107 mmol/L (ref 98–111)
Creatinine, Ser: 0.52 mg/dL (ref 0.44–1.00)
GFR, Estimated: >60 mL/min (ref >60)
Glucose, Bld: 110 mg/dL — ABNORMAL HIGH (ref 70–99)
Potassium: 4.7 mmol/L (ref 3.5–5.1)
Sodium: 135 mmol/L (ref 135–145)

## 2020-12-20 LAB — MAGNESIUM: Magnesium: 1.3 mg/dL — ABNORMAL LOW (ref 1.7–2.4)

## 2020-12-20 MED ORDER — MAGNESIUM OXIDE -MG SUPPLEMENT 400 (240 MG) MG PO TABS
400.0000 mg | ORAL_TABLET | Freq: Three times a day (TID) | ORAL | Status: DC
  Administered 2020-12-20: 400 mg via ORAL
  Filled 2020-12-20: qty 1

## 2020-12-20 MED ORDER — LACTULOSE ENCEPHALOPATHY 10 GM/15ML PO SOLN
20.0000 g | Freq: Two times a day (BID) | ORAL | 0 refills | Status: AC
  Filled 2020-12-20: qty 240, 4d supply, fill #0

## 2020-12-20 NOTE — Progress Notes (Signed)
Discharge paperwork reviewed with pt. Pt verbalized understanding. Pt has gathered her belongings and awaiting for transport (husband). TOC will be delivering pt's med to room before pt is discharged.

## 2020-12-20 NOTE — Discharge Summary (Signed)
Physician Discharge Summary  Nicole Hughes KVQ:[id number] DOB: [birth year] DOA: 12/14/2020  PCP: Judy Pimple, MD  Admit date: 12/14/2020 Discharge date: 12/20/2020  Admitted From: Home Disposition:  Home  Recommendations for Outpatient Follow-up:  Follow up with PCP in 1-2 weeks Please obtain BMP/CBC in one week   Home Health:No Equipment/Devices:  Discharge Condition:Stable CODE STATUS:Full Diet recommendation: Heart Healthy    Brief/Interim Summary: 50 y.o. female past medical history significant for alcohol abuse anxiety/depression, chronic back pain, essential hypertension, remote history of DVT PE during pregnancy off anticoagulation comes into the emergency room due to frequent falls and leg weakness.  In the setting of alcohol abuse syncope work-up has been unrevealing.  Discharge Diagnoses:  Active Problems:   Essential hypertension   Hypokalemia   Chest wall pain   Elevated lipase   Hypomagnesemia   Alcohol withdrawal (HCC)   High anion gap metabolic acidosis   Fall at home, initial encounter   Falls frequently   Syncope   Folic acid deficiency   Acute diastolic CHF (congestive heart failure) (HCC)  High anion gap metabolic acidosis likely due to alcohol and starvation ketosis: She admits to consuming alcohol daily on admission and gap was 24, she was started on IV fluids with D5 and her starvation ketosis resolved.  Recurrent falls likely due to bilateral lower extremity weakness in the setting of alcohol abuse: She relates she hit her head did not lose consciousness imaging was negative. Physical therapy evaluated the patient recommended home health PT. Neurology has been having lower extremity pain she will need an EMG as an outpatient.  Alcohol abuse: She was started on thiamine and folate monitor with CIWA protocol and she was weaned off of Ativan after several days of the protocol.  Severe electrolyte imbalance,  hypokalemia/hypomagnesemia/hypophosphatemia: These were repleted aggressively and they have improved she will continue her potassium as an outpatient.  Elevated ammonia level: In the setting of some confusion, she was started on lactulose which she will continue as an outpatient.  Elevated LFTs: Likely due to alcohol abuse and fatty liver. Right upper quadrant ultrasound showed no visible stones or gallbladder thickening. HIDA scan showed no acute cholecystitis. After abstinence her LFTs started to improve.  Chronic pancytopenia:  Alcohol abuse.  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  Normocytic anemia noted.  Chronic diastolic heart failure she appears euvolemic.  Essential hypertension: No changes made to her medication she will continue metoprolol and amlodipine.  Sinus tachycardia: Is likely Likely from with holding her metoprolol this was resumed is resolved.   Unwitnessed fall: Multiple imaging were done that showed no acute fractures she denies any loss of consciousness.   Discharge Instructions  Discharge Instructions     Diet - low sodium heart healthy   Complete by: As directed    Increase activity slowly   Complete by: As directed       Allergies as of 12/20/2020   No Known Allergies      Medication List     TAKE these medications    amLODipine 10 MG tablet Commonly known as: NORVASC Take 1 tablet (10 mg total) by mouth daily.   lactulose 10 GM/15ML solution Commonly known as: CHRONULAC Take 30 mLs (20 g total) by mouth 2 (two) times daily.   metoprolol succinate 100 MG 24 hr tablet Commonly known as: TOPROL-XL Take 1 tablet (100 mg total) by mouth daily. Take with or immediately following a meal.   pantoprazole 40 MG tablet Commonly  known as: PROTONIX Take 1 tablet (40 mg total) by mouth daily.   potassium chloride SA 20 MEQ tablet Commonly known as: KLOR-CON M Take 2 tablets (40 mEq total) by mouth 2 (two) times  daily.   sertraline 50 MG tablet Commonly known as: ZOLOFT Take 2 tablets (100 mg total) by mouth daily.   traZODone 50 MG tablet Commonly known as: DESYREL Take 2 tablets (100 mg total) by mouth at bedtime as needed for sleep.               Durable Medical Equipment  (From admission, onward)           Start     Ordered   12/17/20 1328  For home use only DME Shower stool  Once        12/17/20 1327            Follow-up Information     [facility].   Why: For home health services. They will contact you in 1-2 days to set up a time to come to your house. Contact information: [address] [place] [address] [phone]                No Known Allergies  Consultations: None   Procedures/Studies: DG Chest 2 View  Result Date: 12/14/2020 CLINICAL DATA:  Chest pain. EXAM: CHEST - 2 VIEW COMPARISON:  Chest x-ray 10/11/2020. FINDINGS: The heart size and mediastinal contours are within normal limits. Both lungs are clear. The visualized skeletal structures are unremarkable. IMPRESSION: No active cardiopulmonary disease. Electronically Signed   By: Darliss Cheney M.D.   On: 12/14/2020 16:13   DG Pelvis 1-2 Views  Result Date: 12/19/2020 CLINICAL DATA:  50 year old female with history of trauma from multiple falls. Pain in both hips. EXAM: PELVIS - 1-2 VIEW COMPARISON:  None. FINDINGS: There is no evidence of pelvic fracture or diastasis. No pelvic bone lesions are seen. IMPRESSION: Negative. Electronically Signed   By: Trudie Reed M.D.   On: 12/19/2020 06:00   DG Knee 1-2 Views Left  Result Date: 12/19/2020 CLINICAL DATA:  50 year old female with history of trauma from multiple falls. Pain in both knees. EXAM: LEFT KNEE - 1-2 VIEW COMPARISON:  No priors. FINDINGS: No evidence of fracture, dislocation, or joint effusion. No evidence of arthropathy or other focal bone abnormality. Soft tissues are unremarkable. IMPRESSION:  Negative. Electronically Signed   By: Trudie Reed M.D.   On: 12/19/2020 05:59   DG Knee 1-2 Views Right  Result Date: 12/19/2020 CLINICAL DATA:  50 year old female with history of trauma from multiple falls. Pain in both knees. EXAM: RIGHT KNEE - 1-2 VIEW COMPARISON:  None. FINDINGS: No evidence of fracture, dislocation, or joint effusion. No evidence of arthropathy or other focal bone abnormality. Soft tissues are unremarkable. IMPRESSION: Negative. Electronically Signed   By: Trudie Reed M.D.   On: 12/19/2020 05:59   CT Head Wo Contrast  Result Date: 12/14/2020 CLINICAL DATA:  Head trauma moderate to severe.  Multiple falls EXAM: CT HEAD WITHOUT CONTRAST CT CERVICAL SPINE WITHOUT CONTRAST TECHNIQUE: Multidetector CT imaging of the head and cervical spine was performed following the standard protocol without intravenous contrast. Multiplanar CT image reconstructions of the cervical spine were also generated. COMPARISON:  CT head 02/25/2020 FINDINGS: CT HEAD FINDINGS Brain: No evidence of acute infarction, hemorrhage, hydrocephalus, extra-axial collection or mass lesion/mass effect. Small hypodensity right internal capsule anteriorly unchanged from prior study compatible with chronic infarct. Small  hypodensity left putamen not seen previously. This is most consistent with infarct of indeterminate age, likely chronic Vascular: Negative for hyperdense vessel Skull: Negative Sinuses/Orbits: Mild mucosal edema left maxillary sinus. This appears odontogenic with a periapical cyst around left upper molar. Negative orbit Other: None CT CERVICAL SPINE FINDINGS Alignment: Mild anterolisthesis C3-4 and C4-5 Skull base and vertebrae: Negative for fracture Soft tissues and spinal canal: Negative for soft tissue mass or edema Disc levels: Disc degeneration and spurring throughout the cervical spine most prominent C3 through C6. Asymmetric facet degeneration on the right. Marked right foraminal encroachment at  C3-4 and C4-5. Marked left foraminal encroachment at C5-6. Upper chest: Lung apices clear bilaterally. Other: None IMPRESSION: 1. No acute intracranial hemorrhage. Small hypodensity left putamen compatible with infarct of indeterminate age. This was not present on the prior study but may be chronic. 2. Cervical spondylosis.  Negative for fracture. Electronically Signed   By: Marlan Palau M.D.   On: 12/14/2020 16:28   CT Cervical Spine Wo Contrast  Result Date: 12/14/2020 CLINICAL DATA:  Head trauma moderate to severe.  Multiple falls EXAM: CT HEAD WITHOUT CONTRAST CT CERVICAL SPINE WITHOUT CONTRAST TECHNIQUE: Multidetector CT imaging of the head and cervical spine was performed following the standard protocol without intravenous contrast. Multiplanar CT image reconstructions of the cervical spine were also generated. COMPARISON:  CT head 02/25/2020 FINDINGS: CT HEAD FINDINGS Brain: No evidence of acute infarction, hemorrhage, hydrocephalus, extra-axial collection or mass lesion/mass effect. Small hypodensity right internal capsule anteriorly unchanged from prior study compatible with chronic infarct. Small hypodensity left putamen not seen previously. This is most consistent with infarct of indeterminate age, likely chronic Vascular: Negative for hyperdense vessel Skull: Negative Sinuses/Orbits: Mild mucosal edema left maxillary sinus. This appears odontogenic with a periapical cyst around left upper molar. Negative orbit Other: None CT CERVICAL SPINE FINDINGS Alignment: Mild anterolisthesis C3-4 and C4-5 Skull base and vertebrae: Negative for fracture Soft tissues and spinal canal: Negative for soft tissue mass or edema Disc levels: Disc degeneration and spurring throughout the cervical spine most prominent C3 through C6. Asymmetric facet degeneration on the right. Marked right foraminal encroachment at C3-4 and C4-5. Marked left foraminal encroachment at C5-6. Upper chest: Lung apices clear bilaterally.  Other: None IMPRESSION: 1. No acute intracranial hemorrhage. Small hypodensity left putamen compatible with infarct of indeterminate age. This was not present on the prior study but may be chronic. 2. Cervical spondylosis.  Negative for fracture. Electronically Signed   By: Marlan Palau M.D.   On: 12/14/2020 16:28   MR BRAIN WO CONTRAST  Result Date: 12/14/2020 CLINICAL DATA:  Follow-up examination for stroke. EXAM: MRI HEAD WITHOUT CONTRAST TECHNIQUE: Multiplanar, multiecho pulse sequences of the brain and surrounding structures were obtained without intravenous contrast. COMPARISON:  CT from earlier the same day. FINDINGS: Brain: Cerebral volume within normal limits for age. Mild patchy T2/FLAIR hyperintensity involving the periventricular deep white matter both cerebral hemispheres, most consistent with chronic small vessel ischemic disease, mild in nature. No abnormal foci of restricted diffusion to suggest acute or subacute ischemia. Gray-white matter differentiation maintained. No encephalomalacia to suggest chronic cortical infarction. No acute intracranial hemorrhage. Few punctate chronic micro hemorrhages noted about the left thalamus and left caudate, within additional punctate hemorrhage at the posterior left temporal region. Findings likely related to underlying hypertension. No mass lesion, midline shift or mass effect. No hydrocephalus or extra-axial fluid collection. Pituitary gland suprasellar region normal. Vascular: Major intracranial vascular flow voids are well maintained. Skull  and upper cervical spine: Cranial junction within normal limits. Bone marrow signal intensity diffusely decreased on T1 weighted sequence, nonspecific, but most commonly related to anemia, smoking or obesity. No scalp soft tissue abnormality. Sinuses/Orbits: Globes orbital soft tissues within normal limits. Paranasal sinuses and mastoid air cells are clear. Other: None. IMPRESSION: 1. No acute intracranial  abnormality. 2. Mild chronic microvascular ischemic disease. Electronically Signed   By: Rise Mu M.D.   On: 12/14/2020 20:27   NM Hepatobiliary Liver Func  Result Date: 12/16/2020 CLINICAL DATA:  Cholecystitis EXAM: NUCLEAR MEDICINE HEPATOBILIARY IMAGING TECHNIQUE: Sequential images of the abdomen were obtained out to 60 minutes following intravenous administration of radiopharmaceutical. RADIOPHARMACEUTICALS:  7.9 mCi Tc-63m  Choletec IV COMPARISON:  None. FINDINGS: Prompt uptake and biliary excretion of activity by the liver is seen. Gallbladder activity is visualized, consistent with patency of cystic duct. Biliary activity passes into small bowel, consistent with patent common bile duct. IMPRESSION: No evidence of acute cholecystitis. Electronically Signed   By: Allegra Lai M.D.   On: 12/16/2020 13:01   DG CHEST PORT 1 VIEW  Result Date: 12/16/2020 CLINICAL DATA:  Fever. EXAM: PORTABLE CHEST 1 VIEW COMPARISON:  Chest radiograph dated 12/14/2020. FINDINGS: Shallow inspiration. No focal consolidation, pleural effusion, or pneumothorax. The cardiac silhouette is within limits. No acute osseous pathology. IMPRESSION: No active disease. Electronically Signed   By: Elgie Collard M.D.   On: 12/16/2020 00:42   US Abdomen Limited RUQ (LIVER/GB)  Result Date: 12/15/2020 CLINICAL DATA:  Abdominal pain EXAM: ULTRASOUND ABDOMEN LIMITED RIGHT UPPER QUADRANT COMPARISON:  None. FINDINGS: Gallbladder: No visible stones or wall thickening. Small amount of pericholecystic fluid Common bile duct: Diameter: Normal caliber, 5 mm Liver: Increased echotexture compatible with fatty infiltration. No focal abnormality or biliary ductal dilatation. Portal vein is patent on color Doppler imaging with normal direction of blood flow towards the liver. Other: None. IMPRESSION: Fatty infiltration of the liver. Small amount of pericholecystic fluid without visible gallstones or wall thickening. This may be  related to liver disease. Electronically Signed   By: Charlett Nose M.D.   On: 12/15/2020 17:58   (Echo, Carotid, EGD, Colonoscopy, ERCP)    Subjective: No complaints  Discharge Exam: Vitals:   12/20/20 0407 12/20/20 0728  BP: (!) 136/92 (!) 142/78  Pulse: 99 97  Resp: 16 16  Temp: 100.2 F (37.9 C) 98 F (36.7 C)  SpO2: 99% 97%   Vitals:   12/20/20 0000 12/20/20 0407 12/20/20 0606 12/20/20 0728  BP: 118/77 (!) 136/92  (!) 142/78  Pulse: 88 99  97  Resp: 16 16  16   Temp: 98 F (36.7 C) 100.2 F (37.9 C)  98 F (36.7 C)  TempSrc: Oral Oral  Oral  SpO2: 98% 99%  97%  Weight:   66.5 kg     General: Pt is alert, awake, not in acute distress Cardiovascular: RRR, S1/S2 +, no rubs, no gallops Respiratory: CTA bilaterally, no wheezing, no rhonchi Abdominal: Soft, NT, ND, bowel sounds + Extremities: no edema, no cyanosis    The results of significant diagnostics from this hospitalization (including imaging, microbiology, ancillary and laboratory) are listed below for reference.     Microbiology: Recent Results (from the past 240 hour(s))  Culture, blood (routine x 2)     Status: None (Preliminary result)   Collection Time: 12/16/20  1:08 AM   Specimen: BLOOD LEFT HAND  Result Value Ref Range Status   Specimen Description BLOOD LEFT HAND  Final  Special Requests   Final    BOTTLES DRAWN AEROBIC ONLY Blood Culture results may not be optimal due to an inadequate volume of blood received in culture bottles   Culture   Final    NO GROWTH 4 DAYS Performed at [facility], [address] [place], [place] [address]    Report Status PENDING  Incomplete  Culture, blood (routine x 2)     Status: None (Preliminary result)   Collection Time: 12/16/20  1:09 AM   Specimen: BLOOD RIGHT HAND  Result Value Ref Range Status   Specimen Description BLOOD RIGHT HAND  Final   Special Requests   Final    BOTTLES DRAWN AEROBIC AND ANAEROBIC Blood Culture adequate volume   Culture    Final    NO GROWTH 4 DAYS Performed at [facility], [address], [place], [place] [address]    Report Status PENDING  Incomplete     Labs: BNP (last 3 results) No results for input(s): BNP in the last 8760 hours. Basic Metabolic Panel: Recent Labs  Lab 12/15/20 0618 12/15/20 1801 12/16/20 0108 12/16/20 1853 12/17/20 0156 12/18/20 0228 12/19/20 0123 12/19/20 1110 12/20/20 0159  NA 136   < > 135  --  136 135 134*  --  135  K 3.1*   < > 2.9* 3.0* 3.0* 3.2* 3.7  --  4.7  CL 103   < > 102  --  105 104 105  --  107  CO2 14*   < > 21*  --  22 20* 22  --  19*  GLUCOSE 70   < > 114*  --  141* 111* 100*  --  110*  BUN 6   < > <5*  --  <5* <5* <5*  --  <5*  CREATININE 0.91   < > 0.67  --  0.56 0.61 0.55  --  0.52  CALCIUM 7.5*   < > 7.8*  --  8.1* 8.7* 8.2*  --  8.3*  MG 2.0  --   --   --  1.2* 1.1* 1.4* 1.2*  --   PHOS 1.4*  --   --   --  <1.0* 2.3* 2.4* 2.4*  --    < > = values in this interval not displayed.   Liver Function Tests: Recent Labs  Lab 12/14/20 1532 12/15/20 0618 12/16/20 0108 12/17/20 0156 12/19/20 0123  AST 217* 169* 213* 140*  --   ALT 34 --   ALKPHOS 81 70 70 73  --   BILITOT 2.7* 3.8* 3.0* 2.7*  --   PROT 7.2 6.1* 5.5* 5.5*  --   ALBUMIN 3.8 3.3* 3.0* 2.9* 3.0*   Recent Labs  Lab 12/14/20 1532  LIPASE 129*   Recent Labs  Lab 12/14/20 1855 12/16/20 0108 12/17/20 0156 12/18/20 0228  AMMONIA 61* 67* 63* 67*   CBC: Recent Labs  Lab 12/14/20 1458 12/15/20 0618 12/16/20 0108 12/17/20 0156  WBC 4.7 3.6* 2.7* 2.9*  HGB 10.6* 8.4* 7.9* 8.0*  HCT 33.1* 25.5* 23.8* 24.0*  MCV 95.1 97.3 95.6 96.4  PLT 164 108* 89* 112*   Cardiac Enzymes: No results for input(s): CKTOTAL, CKMB, CKMBINDEX, TROPONINI in the last 168 hours. BNP: Invalid input(s): POCBNP CBG: No results for input(s): GLUCAP in the last 168 hours. D-Dimer No results for input(s): DDIMER in the last 72 hours. Hgb A1c No results for input(s): HGBA1C in  the last 72 hours. Lipid  Profile No results for input(s): CHOL, HDL, LDLCALC, TRIG, CHOLHDL, LDLDIRECT in the last 72 hours. Thyroid function studies No results for input(s): TSH, T4TOTAL, T3FREE, THYROIDAB in the last 72 hours.  Invalid input(s): FREET3 Anemia work up No results for input(s): VITAMINB12, FOLATE, FERRITIN, TIBC, IRON, RETICCTPCT in the last 72 hours. Urinalysis    Component Value Date/Time   COLORURINE YELLOW 12/16/2020 2005   APPEARANCEUR CLEAR 12/16/2020 2005   LABSPEC 1.005 12/16/2020 2005   PHURINE 7.0 12/16/2020 2005   GLUCOSEU NEGATIVE 12/16/2020 2005   HGBUR SMALL (A) 12/16/2020 2005   BILIRUBINUR NEGATIVE 12/16/2020 2005   BILIRUBINUR trace 12/20/2012 1553   KETONESUR NEGATIVE 12/16/2020 2005   PROTEINUR NEGATIVE 12/16/2020 2005   UROBILINOGEN 0.2 06/22/2014 1147   NITRITE NEGATIVE 12/16/2020 2005   LEUKOCYTESUR NEGATIVE 12/16/2020 2005   Sepsis Labs Invalid input(s): PROCALCITONIN,  WBC,  LACTICIDVEN Microbiology Recent Results (from the past 240 hour(s))  Culture, blood (routine x 2)     Status: None (Preliminary result)   Collection Time: 12/16/20  1:08 AM   Specimen: BLOOD LEFT HAND  Result Value Ref Range Status   Specimen Description BLOOD LEFT HAND  Final   Special Requests   Final    BOTTLES DRAWN AEROBIC ONLY Blood Culture results may not be optimal due to an inadequate volume of blood received in culture bottles   Culture   Final    NO GROWTH 4 DAYS Performed at [facility], [address] [place], [place] [address]    Report Status PENDING  Incomplete  Culture, blood (routine x 2)     Status: None (Preliminary result)   Collection Time: 12/16/20  1:09 AM   Specimen: BLOOD RIGHT HAND  Result Value Ref Range Status   Specimen Description BLOOD RIGHT HAND  Final   Special Requests   Final    BOTTLES DRAWN AEROBIC AND ANAEROBIC Blood Culture adequate volume   Culture   Final    NO GROWTH 4 DAYS Performed at [facility]  Lab, [address], [place], [place] [address]    Report Status PENDING  Incomplete   SIGNED:   Marinda Elk, MD  [facility] 12/20/2020, 9:36 AM Pager   If 7PM-7AM, please contact night-coverage [email or web address] Password [email or web address]

## 2020-12-20 NOTE — TOC Progression Note (Signed)
Transition of Care [facility]) - Progression Note    Patient Details  Name: Nicole Hughes MRN: [id number] Date of Birth: Oct 01, 1970  Transition [facility]) CM/SW Contact  Harriet Masson, RN Phone Number: 12/20/2020, 9:08 AM  Clinical Narrative:    [facility] has accepted patient for HH-PT/OT.   Expected Discharge Plan: Home/Self Care Barriers to Discharge: Continued Medical Work up  Expected Discharge Plan and Services Expected Discharge Plan: Home/Self Care   Discharge Planning Services: CM Consult   Living arrangements for the past 2 months: Single Family Home                           HH Arranged: PT, OT HH Agency: [facility] ([facility]) Date [facility] Agency Contacted: 12/20/20 Time HH Agency Contacted: [phone] Representative spoke with at [facility] Agency: Eber Jones   Social Determinants of Health (SDOH) Interventions    Readmission Risk Interventions No flowsheet data found.

## 2020-12-20 NOTE — Progress Notes (Signed)
Pt states her husband will be here around 12pm to transport her home.

## 2020-12-20 NOTE — TOC Transition Note (Signed)
Transition of Care [facility] - CM/SW Discharge Note   Patient Details  Name: Nicole Hughes MRN: [id number] Date of Birth: 1970/05/11  Transition of Care [facility]) CM/SW Contact:  Harriet Masson, RN Phone Number: 12/20/2020, 10:40 AM   Clinical Narrative:    Patient stable for discharge.  HH-PT/OT has been arranged with [facility]. Patient states she has a shower bench. Husband can transport patient home. Address, Phone number and PCP verified.    Final next level of care: Home w Home Health Services Barriers to Discharge: Barriers Resolved   Patient Goals and CMS Choice Patient states their goals for this hospitalization and ongoing recovery are:: to go home      Discharge Placement             home          Discharge Plan and Services   Discharge Planning Services: CM Consult                      HH Arranged: PT, OT [facility] Agency: [facility]) Date [facility] Agency Contacted: 12/20/20 Time HH Agency Contacted: [phone] Representative spoke with at [facility] Agency: Eber Jones  Social Determinants of Health (SDOH) Interventions     Readmission Risk Interventions Readmission Risk Prevention Plan 12/20/2020  Transportation Screening Complete  PCP or Specialist Appt within 3-5 Days Complete  HRI or Home Care Consult Complete  Social Work Consult for Recovery Care Planning/Counseling Not Complete  SW consult not completed comments insurance doesn't cover  Palliative Care Screening Not Applicable  Medication Review [employer]) Complete  Some recent data might be hidden

## 2020-12-21 LAB — CULTURE, BLOOD (ROUTINE X 2)
Culture: NO GROWTH
Culture: NO GROWTH
Special Requests: ADEQUATE

## 2021-01-15 ENCOUNTER — Telehealth: Payer: Self-pay | Admitting: *Deleted

## 2021-01-15 NOTE — Telephone Encounter (Signed)
Transition Care Management Unsuccessful Follow-up Telephone Call  Date of discharge and from where: 12/20/20  [facility]  Attempts:  1st Attempt  Reason for unsuccessful TCM follow-up call:  No answer/busy  Irving Shows [facility], BSN RN Case Manager [phone]

## 2021-01-21 ENCOUNTER — Telehealth: Payer: Self-pay | Admitting: *Deleted

## 2021-01-21 NOTE — Telephone Encounter (Signed)
Transition Care Management Unsuccessful Follow-up Telephone Call  Date of discharge and from where:  [facility]  12/20/20  Attempts:  2nd Attempt  Reason for unsuccessful TCM follow-up call:  No answer/busy  Irving Shows [facility], BSN RN Case Manager [phone]

## 2021-03-21 ENCOUNTER — Other Ambulatory Visit: Payer: Self-pay | Admitting: Family Medicine

## 2021-05-09 ENCOUNTER — Other Ambulatory Visit: Payer: Self-pay | Admitting: Family Medicine

## 2021-05-09 NOTE — Telephone Encounter (Signed)
Letter mailed letting pt know ?

## 2021-05-09 NOTE — Telephone Encounter (Signed)
Pt had a virtual visit on 09/05/20 but no recent or future f/u or CPE appts.  ?

## 2021-05-09 NOTE — Telephone Encounter (Signed)
Needs a summer f/u please  ?

## 2021-05-22 ENCOUNTER — Inpatient Hospital Stay (HOSPITAL_COMMUNITY)
Admission: EM | Admit: 2021-05-22 | Discharge: 2021-06-02 | DRG: 439 | Disposition: A | Discharge status: Home / Self Care | Payer: BC Managed Care – PPO | Attending: Internal Medicine | Admitting: Internal Medicine

## 2021-05-22 ENCOUNTER — Encounter (HOSPITAL_COMMUNITY): Payer: Self-pay | Admitting: Emergency Medicine

## 2021-05-22 ENCOUNTER — Emergency Department (HOSPITAL_COMMUNITY): Payer: BC Managed Care – PPO

## 2021-05-22 DIAGNOSIS — F332 Major depressive disorder, recurrent severe without psychotic features: Secondary | ICD-10-CM | POA: Diagnosis present

## 2021-05-22 DIAGNOSIS — Z8249 Family history of ischemic heart disease and other diseases of the circulatory system: Secondary | ICD-10-CM

## 2021-05-22 DIAGNOSIS — Z20822 Contact with and (suspected) exposure to covid-19: Secondary | ICD-10-CM | POA: Diagnosis not present

## 2021-05-22 DIAGNOSIS — F1010 Alcohol abuse, uncomplicated: Secondary | ICD-10-CM | POA: Diagnosis not present

## 2021-05-22 DIAGNOSIS — M545 Low back pain, unspecified: Secondary | ICD-10-CM | POA: Diagnosis present

## 2021-05-22 DIAGNOSIS — D61818 Other pancytopenia: Secondary | ICD-10-CM | POA: Diagnosis not present

## 2021-05-22 DIAGNOSIS — F10939 Alcohol use, unspecified with withdrawal, unspecified: Secondary | ICD-10-CM | POA: Diagnosis present

## 2021-05-22 DIAGNOSIS — I429 Cardiomyopathy, unspecified: Secondary | ICD-10-CM | POA: Diagnosis present

## 2021-05-22 DIAGNOSIS — K219 Gastro-esophageal reflux disease without esophagitis: Secondary | ICD-10-CM | POA: Diagnosis not present

## 2021-05-22 DIAGNOSIS — I10 Essential (primary) hypertension: Secondary | ICD-10-CM | POA: Diagnosis present

## 2021-05-22 DIAGNOSIS — K7010 Alcoholic hepatitis without ascites: Secondary | ICD-10-CM | POA: Diagnosis not present

## 2021-05-22 DIAGNOSIS — F410 Panic disorder [episodic paroxysmal anxiety] without agoraphobia: Secondary | ICD-10-CM | POA: Diagnosis present

## 2021-05-22 DIAGNOSIS — R7401 Elevation of levels of liver transaminase levels: Secondary | ICD-10-CM | POA: Diagnosis not present

## 2021-05-22 DIAGNOSIS — R1013 Epigastric pain: Secondary | ICD-10-CM | POA: Diagnosis present

## 2021-05-22 DIAGNOSIS — Y900 Blood alcohol level of less than 20 mg/100 ml: Secondary | ICD-10-CM | POA: Diagnosis present

## 2021-05-22 DIAGNOSIS — R45851 Suicidal ideations: Secondary | ICD-10-CM | POA: Diagnosis not present

## 2021-05-22 DIAGNOSIS — Z6824 Body mass index (BMI) 24.0-24.9, adult: Secondary | ICD-10-CM

## 2021-05-22 DIAGNOSIS — F10930 Alcohol use, unspecified with withdrawal, uncomplicated: Principal | ICD-10-CM

## 2021-05-22 DIAGNOSIS — G8929 Other chronic pain: Secondary | ICD-10-CM | POA: Diagnosis present

## 2021-05-22 DIAGNOSIS — I700 Atherosclerosis of aorta: Secondary | ICD-10-CM | POA: Diagnosis not present

## 2021-05-22 DIAGNOSIS — I82611 Acute embolism and thrombosis of superficial veins of right upper extremity: Secondary | ICD-10-CM | POA: Diagnosis not present

## 2021-05-22 DIAGNOSIS — D6959 Other secondary thrombocytopenia: Secondary | ICD-10-CM | POA: Diagnosis present

## 2021-05-22 DIAGNOSIS — I82622 Acute embolism and thrombosis of deep veins of left upper extremity: Secondary | ICD-10-CM | POA: Diagnosis present

## 2021-05-22 DIAGNOSIS — K852 Alcohol induced acute pancreatitis without necrosis or infection: Secondary | ICD-10-CM | POA: Diagnosis not present

## 2021-05-22 DIAGNOSIS — D649 Anemia, unspecified: Secondary | ICD-10-CM | POA: Diagnosis present

## 2021-05-22 DIAGNOSIS — F4310 Post-traumatic stress disorder, unspecified: Secondary | ICD-10-CM | POA: Diagnosis present

## 2021-05-22 DIAGNOSIS — M79602 Pain in left arm: Secondary | ICD-10-CM | POA: Diagnosis not present

## 2021-05-22 DIAGNOSIS — F10231 Alcohol dependence with withdrawal delirium: Secondary | ICD-10-CM | POA: Diagnosis present

## 2021-05-22 DIAGNOSIS — Z9851 Tubal ligation status: Secondary | ICD-10-CM

## 2021-05-22 DIAGNOSIS — M79601 Pain in right arm: Secondary | ICD-10-CM | POA: Diagnosis not present

## 2021-05-22 DIAGNOSIS — K920 Hematemesis: Secondary | ICD-10-CM | POA: Diagnosis not present

## 2021-05-22 DIAGNOSIS — R636 Underweight: Secondary | ICD-10-CM | POA: Diagnosis present

## 2021-05-22 DIAGNOSIS — E876 Hypokalemia: Secondary | ICD-10-CM | POA: Diagnosis not present

## 2021-05-22 DIAGNOSIS — F10239 Alcohol dependence with withdrawal, unspecified: Secondary | ICD-10-CM | POA: Diagnosis not present

## 2021-05-22 DIAGNOSIS — Z86711 Personal history of pulmonary embolism: Secondary | ICD-10-CM

## 2021-05-22 DIAGNOSIS — Z79899 Other long term (current) drug therapy: Secondary | ICD-10-CM

## 2021-05-22 DIAGNOSIS — I82723 Chronic embolism and thrombosis of deep veins of upper extremity, bilateral: Secondary | ICD-10-CM | POA: Diagnosis present

## 2021-05-22 DIAGNOSIS — G4700 Insomnia, unspecified: Secondary | ICD-10-CM | POA: Diagnosis present

## 2021-05-22 DIAGNOSIS — R110 Nausea: Secondary | ICD-10-CM | POA: Diagnosis not present

## 2021-05-22 DIAGNOSIS — Z639 Problem related to primary support group, unspecified: Secondary | ICD-10-CM

## 2021-05-22 DIAGNOSIS — R0602 Shortness of breath: Secondary | ICD-10-CM | POA: Diagnosis not present

## 2021-05-22 DIAGNOSIS — R079 Chest pain, unspecified: Secondary | ICD-10-CM | POA: Diagnosis not present

## 2021-05-22 DIAGNOSIS — R42 Dizziness and giddiness: Secondary | ICD-10-CM | POA: Diagnosis not present

## 2021-05-22 DIAGNOSIS — R109 Unspecified abdominal pain: Secondary | ICD-10-CM | POA: Diagnosis not present

## 2021-05-22 LAB — CBC
HCT: 29.7 % — ABNORMAL LOW (ref 36.0–46.0)
Hemoglobin: 9.5 g/dL — ABNORMAL LOW (ref 12.0–15.0)
MCH: 30.3 pg (ref 26.0–34.0)
MCHC: 32 g/dL (ref 30.0–36.0)
MCV: 94.6 fL (ref 80.0–100.0)
Platelets: 130 10*3/uL — ABNORMAL LOW (ref 150–400)
RBC: 3.14 MIL/uL — ABNORMAL LOW (ref 3.87–5.11)
WBC: 6 10*3/uL (ref 4.0–10.5)
nRBC: 0 % (ref 0.0–0.2)

## 2021-05-22 LAB — URINALYSIS, ROUTINE W REFLEX MICROSCOPIC
Bilirubin Urine: NEGATIVE
Glucose, UA: NEGATIVE mg/dL
Ketones, ur: NEGATIVE mg/dL
Nitrite: NEGATIVE
Protein, ur: NEGATIVE mg/dL
Specific Gravity, Urine: 1.038 — ABNORMAL HIGH (ref 1.005–1.030)
pH: 6 (ref 5.0–8.0)

## 2021-05-22 LAB — COMPREHENSIVE METABOLIC PANEL
ALT: 33 U/L (ref 0–44)
AST: 264 U/L — ABNORMAL HIGH (ref 15–41)
Albumin: 3.5 g/dL (ref 3.5–5.0)
Alkaline Phosphatase: 90 U/L (ref 38–126)
Anion gap: 9 (ref 5–15)
BUN: 12 mg/dL (ref 6–20)
CO2: 20 mmol/L — ABNORMAL LOW (ref 22–32)
Calcium: 8.8 mg/dL — ABNORMAL LOW (ref 8.9–10.3)
Chloride: 107 mmol/L (ref 98–111)
Creatinine, Ser: 0.81 mg/dL (ref 0.44–1.00)
GFR, Estimated: >60 mL/min (ref >60)
Glucose, Bld: 83 mg/dL (ref 70–99)
Potassium: 4.9 mmol/L (ref 3.5–5.1)
Sodium: 136 mmol/L (ref 135–145)
Total Bilirubin: 1.6 mg/dL — ABNORMAL HIGH (ref 0.3–1.2)
Total Protein: 7.4 g/dL (ref 6.5–8.1)

## 2021-05-22 LAB — TROPONIN I (HIGH SENSITIVITY)
Troponin I (High Sensitivity): 6 ng/L (ref <18)
Troponin I (High Sensitivity): 6 ng/L (ref <18)

## 2021-05-22 LAB — I-STAT BETA HCG BLOOD, ED (MC, WL, AP ONLY): I-stat hCG, quantitative: <5 m[IU]/mL (ref <5)

## 2021-05-22 LAB — LIPASE, BLOOD: Lipase: 141 U/L — ABNORMAL HIGH (ref 11–51)

## 2021-05-22 LAB — RESP PANEL BY RT-PCR (FLU A&B, COVID) ARPGX2
Influenza A by PCR: NEGATIVE
Influenza B by PCR: NEGATIVE
SARS Coronavirus 2 by RT PCR: NEGATIVE

## 2021-05-22 LAB — VITAMIN B12: Vitamin B-12: 786 pg/mL (ref 180–914)

## 2021-05-22 MED ORDER — MORPHINE SULFATE (PF) 4 MG/ML IV SOLN
4.0000 mg | Freq: Once | INTRAVENOUS | Status: AC
  Administered 2021-05-22: 4 mg via INTRAVENOUS
  Filled 2021-05-22: qty 1

## 2021-05-22 MED ORDER — LORAZEPAM 1 MG PO TABS
2.0000 mg | ORAL_TABLET | Freq: Once | ORAL | Status: AC
  Administered 2021-05-22: 2 mg via ORAL
  Filled 2021-05-22: qty 2

## 2021-05-22 MED ORDER — METOCLOPRAMIDE HCL 5 MG/ML IJ SOLN
5.0000 mg | Freq: Once | INTRAMUSCULAR | Status: AC
  Administered 2021-05-22: 5 mg via INTRAVENOUS
  Filled 2021-05-22: qty 2

## 2021-05-22 MED ORDER — IOHEXOL 350 MG/ML SOLN
75.0000 mL | Freq: Once | INTRAVENOUS | Status: AC | PRN
  Administered 2021-05-22: 75 mL via INTRAVENOUS

## 2021-05-22 MED ORDER — FAMOTIDINE IN NACL 20-0.9 MG/50ML-% IV SOLN
20.0000 mg | Freq: Once | INTRAVENOUS | Status: AC
  Administered 2021-05-22: 20 mg via INTRAVENOUS
  Filled 2021-05-22: qty 50

## 2021-05-22 MED ORDER — SODIUM CHLORIDE 0.9 % IV SOLN: Freq: Once | INTRAVENOUS | Status: AC

## 2021-05-22 MED ORDER — LORAZEPAM 2 MG/ML IJ SOLN
2.0000 mg | Freq: Once | INTRAMUSCULAR | Status: AC
  Administered 2021-05-22: 2 mg via INTRAVENOUS
  Filled 2021-05-22: qty 1

## 2021-05-22 MED ORDER — LORAZEPAM 2 MG/ML IJ SOLN
2.0000 mg | Freq: Once | INTRAMUSCULAR | Status: AC
Start: 2021-05-22 — End: 2021-05-22
  Administered 2021-05-22: 2 mg via INTRAVENOUS
  Filled 2021-05-22: qty 1

## 2021-05-22 MED ORDER — SODIUM CHLORIDE 0.9 % IV BOLUS
1000.0000 mL | Freq: Once | INTRAVENOUS | Status: AC
  Administered 2021-05-22: 1000 mL via INTRAVENOUS

## 2021-05-22 NOTE — ED Notes (Signed)
Pt off unit to CT

## 2021-05-22 NOTE — BH Assessment (Addendum)
?-  Patient arrive to the ED @ 15/12. TTS consult entered at @1620 .  ? ?-Patient's chart notes: Pt c/o CP onset today, numbness- "everything is numb," N/V/D x "a couple days," 1 episode of "brown, bloody-looking" emesis this AM. Endorses associated lightheaded/dizziness, denies taking home BP meds today."  ? ?-@1855  Reached to patient's nurse and EDP requesting an H&P and clinicals details for entering consult. TTS is unclear of the reason for the consult.  ? ?-@1907  Received a reply back from nursing that patient in alcohol withdrawal and not medically cleared.  ? ?-@1908 , requested patient's nurse/EDP to notify TTS when patient is medically cleared and H&P has been completed. The EDP agreed to so. ?

## 2021-05-22 NOTE — ED Provider Notes (Signed)
[facility]ENCY [facility] Provider Note   CSN: [id number] Arrival date & time: 05/22/21  1512     History  Chief Complaint  Patient presents with   Chest Pain   Shortness of Breath   Nausea   Emesis   Dizziness    Nicole Hughes is a 51 y.o. female.  HPI  51 year old female with past medical history of daily alcohol abuse presents to the emergency department with upper abdominal pain, nausea/vomiting.  Patient admits to drinking liquor daily, she has been trying to decrease her daily intake.  Last drink was last night.  Today she developed upper abdominal discomfort that radiated into her chest associated with nausea/vomiting.  She states on her last episode of emesis there was some blood streaks in it.  Denies any diarrhea.  No fever or genitourinary symptoms.  Pain does not radiate into her upper back.  She states that she has withdrawn before and this feels similar in regards to her shakiness and nausea.  She is interested in detox and rehab.  Home Medications Prior to Admission medications   Medication Sig Start Date End Date Taking? Authorizing Provider  amLODipine (NORVASC) 10 MG tablet Take 1 tablet (10 mg total) by mouth daily. 09/05/20  Yes Tower, Audrie GallusMarne A, MD  Carboxymethylcellul-Glycerin (CLEAR EYES FOR DRY EYES OP) Place 1 drop into both eyes daily as needed (dryness).   Yes [provider]  lactulose, encephalopathy, (CHRONULAC) 10 GM/15ML SOLN Take 30 mLs (20 g total) by mouth 2 (two) times daily. 12/20/20  Yes Marinda ElkFeliz Ortiz, Abraham, MD  metoprolol succinate (TOPROL-XL) 100 MG 24 hr tablet TAKE 1 TABLET BY MOUTH ONCE DAILY WITH A MEAL OR IMMEDIATELY FOLLOWING A MEAL. APPOINTMENT REQUIRED FOR FUTURE REFILLS Patient taking differently: Take 100 mg by mouth daily. 05/09/21  Yes Tower, Audrie GallusMarne A, MD  traZODone (DESYREL) 50 MG tablet Take 2 tablets (100 mg total) by mouth at bedtime as needed for sleep. 09/05/20  Yes Tower, Audrie GallusMarne A, MD       Allergies    Patient has no known allergies.    Review of Systems   Review of Systems  Constitutional:  Positive for fatigue. Negative for fever.  Respiratory:  Negative for shortness of breath.   Cardiovascular:  Positive for chest pain.  Gastrointestinal:  Positive for abdominal pain, nausea and vomiting. Negative for diarrhea.  Genitourinary:  Negative for dysuria.  Musculoskeletal:  Negative for back pain.  Skin:  Negative for rash.  Neurological:  Negative for headaches.   Physical Exam Updated Vital Signs BP (!) 165/98   Pulse (!) 104   Temp 98.7 F (37.1 C) (Oral)   Resp 10   SpO2 100%  Physical Exam Vitals and nursing note reviewed.  Constitutional:      Appearance: Normal appearance. She is ill-appearing.  HENT:     Head: Normocephalic.     Mouth/Throat:     Mouth: Mucous membranes are moist.  Cardiovascular:     Rate and Rhythm: Tachycardia present.  Pulmonary:     Effort: Pulmonary effort is normal. No respiratory distress.  Abdominal:     Palpations: Abdomen is soft.     Tenderness: There is abdominal tenderness. There is no guarding or rebound.  Musculoskeletal:     Right lower leg: No edema.     Left lower leg: No edema.  Skin:    General: Skin is warm.  Neurological:     Mental Status: She is alert and oriented to  person, place, and time. Mental status is at baseline.  Psychiatric:        Mood and Affect: Mood normal.    ED Results / Procedures / Treatments   Labs (all labs ordered are listed, but only abnormal results are displayed) Labs Reviewed  LIPASE, BLOOD - Abnormal; Notable for the following components:      Result Value   Lipase 141 (*)    All other components within normal limits  COMPREHENSIVE METABOLIC PANEL - Abnormal; Notable for the following components:   CO2 20 (*)    Calcium 8.8 (*)    AST 264 (*)    Total Bilirubin 1.6 (*)    All other components within normal limits  CBC - Abnormal; Notable for the following  components:   RBC 3.14 (*)    Hemoglobin 9.5 (*)    HCT 29.7 (*)    Platelets 130 (*)    All other components within normal limits  RESP PANEL BY RT-PCR (FLU A&B, COVID) [id number]  VITAMIN B12  URINALYSIS, ROUTINE W REFLEX MICROSCOPIC  I-STAT BETA HCG BLOOD, ED ([facility], [facility], [facility] ONLY)  I-STAT BETA HCG BLOOD, ED ([facility], [facility], [facility] ONLY)  I-STAT BETA HCG BLOOD, ED ([facility], [facility], [facility] ONLY)  TROPONIN I (HIGH SENSITIVITY)  TROPONIN I (HIGH SENSITIVITY)    EKG EKG Interpretation  Date/Time:  Wednesday May 22 2021 19:35:50 EDT Ventricular Rate:  103 PR Interval:  149 QRS Duration: 79 QT Interval:  351 QTC Calculation: 460 R Axis:   -6 Text Interpretation: Sinus tachycardia Low voltage, precordial leads Consider anterior infarct Confirmed by Coralee Pesa ([id number]) on 05/22/2021 7:39:28 PM  Radiology CT Abdomen Pelvis W Contrast  Result Date: 05/22/2021 CLINICAL DATA:  Acute abdominal pain with elevated lipase, initial encounter EXAM: CT ABDOMEN AND PELVIS WITH CONTRAST TECHNIQUE: Multidetector CT imaging of the abdomen and pelvis was performed using the standard protocol following bolus administration of intravenous contrast. RADIATION DOSE REDUCTION: This exam was performed according to the departmental dose-optimization program which includes automated exposure control, adjustment of the mA and/or kV according to patient size and/or use of iterative reconstruction technique. CONTRAST:  75 mL Omnipaque 350. COMPARISON:  07/08/2020 FINDINGS: Lower chest: Mild dependent atelectatic changes are noted. No focal infiltrate is seen. Hepatobiliary: Fatty infiltration of the liver is noted. The gallbladder is well distended without evidence of cholelithiasis. No biliary ductal dilatation is seen. Pancreas: Unremarkable. No pancreatic ductal dilatation or surrounding inflammatory changes. Spleen: Normal in size without focal abnormality. Adrenals/Urinary Tract: Adrenal glands are within normal limits. Kidneys show normal  enhancement pattern bilaterally. No renal calculi or obstructive changes are seen. Normal excretion is noted on delayed images. Bladder is partially distended. Stomach/Bowel: The appendix is within normal limits. No obstructive or inflammatory changes of the colon are seen. Stomach is within normal limits. Small bowel shows no focal abnormality. Vascular/Lymphatic: Aortic atherosclerosis. No enlarged abdominal or pelvic lymph nodes. Reproductive: Uterus and bilateral adnexa are unremarkable. Other: No abdominal wall hernia or abnormality. No abdominopelvic ascites. Musculoskeletal: No acute or significant osseous findings. IMPRESSION: Fatty liver. No acute abnormality is noted. Specifically the pancreas is within normal limits. Electronically Signed   By: Alcide Clever M.D.   On: 05/22/2021 19:36   DG Chest Port 1 View  Result Date: 05/22/2021 CLINICAL DATA:  cp EXAM: PORTABLE CHEST 1 VIEW COMPARISON:  December 16, 2020 FINDINGS: The cardiomediastinal silhouette is unchanged in contour.Unchanged elevation of the RIGHT hemidiaphragm. No pleural effusion. No pneumothorax. No acute pleuroparenchymal abnormality.  Visualized abdomen is unremarkable. IMPRESSION: No acute cardiopulmonary abnormality. Electronically Signed   By: Meda Klinefelter M.D.   On: 05/22/2021 18:54    Procedures Procedures    Medications Ordered in ED Medications  LORazepam (ATIVAN) tablet 2 mg (2 mg Oral Given 05/22/21 1710)  sodium chloride 0.9 % bolus 1,000 mL (1,000 mLs Intravenous New Bag/Given 05/22/21 1844)  morphine (PF) 4 MG/ML injection 4 mg (4 mg Intravenous Given 05/22/21 1841)  iohexol (OMNIPAQUE) 350 MG/ML injection 75 mL (75 mLs Intravenous Contrast Given 05/22/21 1909)    ED Course/ Medical Decision Making/ A&P                           Medical Decision Making Amount and/or Complexity of Data Reviewed Radiology: ordered.  Risk Prescription drug management. Decision regarding hospitalization.   51 year old  female presents to the emergency department with concern for upper abdominal pain radiating up into her chest, nausea/vomiting, tremors.  Patient admits to daily alcohol use, recently cutting back.  Last drink last night.  She states that this feels similar to previous withdrawal.  Patient is tachycardic, hypertensive.  Blood work shows transaminitis, slightly elevated lipase.  Cardiac work-up is reassuring, normal troponins.  CT of the abdomen pelvis identifies no acute finding.  Low suspicion for ACS.  I believe tachycardia and symptoms are secondary to alcohol withdrawal.  Patient improved with IV medication, specifically Ativan.  Patient is interested in medical detox and rehab.  Will place on CIWA protocol and treat symptomatically.  Plan for admission.  Of note patient had a provoked DVT/PE during pregnancy.  She required short-term anticoagulation.  I do not believe that this chest pain or tachycardia is related to that and this seems secondary to alcohol withdrawal.  No hypoxia or acute shortness of breath.  No unilateral leg swelling/DVT findings.        Final Clinical Impression(s) / ED Diagnoses Final diagnoses:  None    Rx / DC Orders ED Discharge Orders     None         Rozelle Logan, DO 05/23/21 0004

## 2021-05-22 NOTE — ED Triage Notes (Signed)
Pt c/o CP onset today, numbness- "everything is numb," N/V/D x "a couple days," 1 episode of "brown, bloody-looking" emesis this AM. Endorses associated lightheaded/dizziness, denies taking home BP meds today.  ?

## 2021-05-22 NOTE — ED Provider Triage Note (Addendum)
Emergency Medicine Provider Triage Evaluation Note  Nicole Hughes , a 51 y.o. female  was evaluated in triage.  Pt complains of fatigue, CP, numbness nv.   Some blood in emesis. Was 1/5 of liquor a day drinker and now down to a pint. Last drink last night (can't recall time).   Hx of withraw issues but no seizure hx.   Review of Systems  Positive: NV and CP Negative: Fever   Physical Exam  BP (!) 169/116   Pulse (!) 104   Temp 98.7 F (37.1 C) (Oral)   Resp 16   SpO2 100%  Gen:   Awake, uncomfortable Resp:  Normal effort  MSK:   Moves extremities without difficulty  Other:  Jaundiced   Medical Decision Making  Medically screening exam initiated at 4:14 PM.  Appropriate orders placed.  TEASIA ZAPF was informed that the remainder of the evaluation will be completed by another provider, this initial triage assessment does not replace that evaluation, and the importance of remaining in the ED until their evaluation is complete.  Labs, CIWA protocol and 1 dose of PO ativan (ativan to be given in triage if possible)     Gailen Shelter, PA 05/22/21 1618    Gailen Shelter, PA 05/22/21 1619    Gailen Shelter, [place] 05/22/21 1621

## 2021-05-23 ENCOUNTER — Encounter (HOSPITAL_COMMUNITY): Payer: Self-pay | Admitting: Internal Medicine

## 2021-05-23 ENCOUNTER — Other Ambulatory Visit: Payer: Self-pay

## 2021-05-23 DIAGNOSIS — K7010 Alcoholic hepatitis without ascites: Secondary | ICD-10-CM | POA: Diagnosis present

## 2021-05-23 DIAGNOSIS — R1013 Epigastric pain: Secondary | ICD-10-CM

## 2021-05-23 DIAGNOSIS — D649 Anemia, unspecified: Secondary | ICD-10-CM | POA: Diagnosis present

## 2021-05-23 DIAGNOSIS — K852 Alcohol induced acute pancreatitis without necrosis or infection: Secondary | ICD-10-CM | POA: Diagnosis present

## 2021-05-23 DIAGNOSIS — F1010 Alcohol abuse, uncomplicated: Secondary | ICD-10-CM

## 2021-05-23 DIAGNOSIS — R7401 Elevation of levels of liver transaminase levels: Secondary | ICD-10-CM | POA: Diagnosis not present

## 2021-05-23 DIAGNOSIS — F410 Panic disorder [episodic paroxysmal anxiety] without agoraphobia: Secondary | ICD-10-CM | POA: Diagnosis present

## 2021-05-23 DIAGNOSIS — E876 Hypokalemia: Secondary | ICD-10-CM | POA: Diagnosis not present

## 2021-05-23 DIAGNOSIS — F10231 Alcohol dependence with withdrawal delirium: Secondary | ICD-10-CM | POA: Diagnosis present

## 2021-05-23 DIAGNOSIS — R45851 Suicidal ideations: Secondary | ICD-10-CM | POA: Diagnosis not present

## 2021-05-23 DIAGNOSIS — G4700 Insomnia, unspecified: Secondary | ICD-10-CM | POA: Diagnosis present

## 2021-05-23 DIAGNOSIS — Y900 Blood alcohol level of less than 20 mg/100 ml: Secondary | ICD-10-CM | POA: Diagnosis present

## 2021-05-23 DIAGNOSIS — I82723 Chronic embolism and thrombosis of deep veins of upper extremity, bilateral: Secondary | ICD-10-CM | POA: Diagnosis present

## 2021-05-23 DIAGNOSIS — R636 Underweight: Secondary | ICD-10-CM | POA: Diagnosis present

## 2021-05-23 DIAGNOSIS — F10930 Alcohol use, unspecified with withdrawal, uncomplicated: Secondary | ICD-10-CM | POA: Diagnosis not present

## 2021-05-23 DIAGNOSIS — I82611 Acute embolism and thrombosis of superficial veins of right upper extremity: Secondary | ICD-10-CM | POA: Diagnosis present

## 2021-05-23 DIAGNOSIS — I10 Essential (primary) hypertension: Secondary | ICD-10-CM | POA: Diagnosis present

## 2021-05-23 DIAGNOSIS — F4310 Post-traumatic stress disorder, unspecified: Secondary | ICD-10-CM | POA: Diagnosis present

## 2021-05-23 DIAGNOSIS — F10939 Alcohol use, unspecified with withdrawal, unspecified: Secondary | ICD-10-CM | POA: Diagnosis not present

## 2021-05-23 DIAGNOSIS — M545 Low back pain, unspecified: Secondary | ICD-10-CM | POA: Diagnosis present

## 2021-05-23 DIAGNOSIS — G8929 Other chronic pain: Secondary | ICD-10-CM | POA: Diagnosis present

## 2021-05-23 DIAGNOSIS — Z20822 Contact with and (suspected) exposure to covid-19: Secondary | ICD-10-CM | POA: Diagnosis present

## 2021-05-23 DIAGNOSIS — D61818 Other pancytopenia: Secondary | ICD-10-CM | POA: Diagnosis not present

## 2021-05-23 DIAGNOSIS — F332 Major depressive disorder, recurrent severe without psychotic features: Secondary | ICD-10-CM | POA: Diagnosis present

## 2021-05-23 DIAGNOSIS — K219 Gastro-esophageal reflux disease without esophagitis: Secondary | ICD-10-CM | POA: Diagnosis present

## 2021-05-23 DIAGNOSIS — M79602 Pain in left arm: Secondary | ICD-10-CM | POA: Diagnosis not present

## 2021-05-23 DIAGNOSIS — I429 Cardiomyopathy, unspecified: Secondary | ICD-10-CM | POA: Diagnosis present

## 2021-05-23 DIAGNOSIS — M79601 Pain in right arm: Secondary | ICD-10-CM | POA: Diagnosis not present

## 2021-05-23 DIAGNOSIS — I82622 Acute embolism and thrombosis of deep veins of left upper extremity: Secondary | ICD-10-CM | POA: Diagnosis present

## 2021-05-23 DIAGNOSIS — D6959 Other secondary thrombocytopenia: Secondary | ICD-10-CM | POA: Diagnosis present

## 2021-05-23 LAB — IRON AND TIBC
Iron: 81 ug/dL (ref 28–170)
Saturation Ratios: 26 % (ref 10.4–31.8)
TIBC: 316 ug/dL (ref 250–450)
UIBC: 235 ug/dL

## 2021-05-23 LAB — CBC WITH DIFFERENTIAL/PLATELET
Abs Immature Granulocytes: 0.01 10*3/uL (ref 0.00–0.07)
Basophils Absolute: 0.1 10*3/uL (ref 0.0–0.1)
Basophils Relative: 1 %
Eosinophils Absolute: 0.1 10*3/uL (ref 0.0–0.5)
Eosinophils Relative: 1 %
HCT: 27.3 % — ABNORMAL LOW (ref 36.0–46.0)
Hemoglobin: 8.7 g/dL — ABNORMAL LOW (ref 12.0–15.0)
Immature Granulocytes: 0 %
Lymphocytes Relative: 25 %
Lymphs Abs: 1.1 10*3/uL (ref 0.7–4.0)
MCH: 30.1 pg (ref 26.0–34.0)
MCHC: 31.9 g/dL (ref 30.0–36.0)
MCV: 94.5 fL (ref 80.0–100.0)
Monocytes Absolute: 0.5 10*3/uL (ref 0.1–1.0)
Monocytes Relative: 11 %
Neutro Abs: 2.7 10*3/uL (ref 1.7–7.7)
Neutrophils Relative %: 62 %
Platelets: 99 10*3/uL — ABNORMAL LOW (ref 150–400)
RBC: 2.89 MIL/uL — ABNORMAL LOW (ref 3.87–5.11)
RDW: 24.4 % — ABNORMAL HIGH (ref 11.5–15.5)
WBC: 4.5 10*3/uL (ref 4.0–10.5)
nRBC: 0 % (ref 0.0–0.2)

## 2021-05-23 LAB — BILIRUBIN, DIRECT: Bilirubin, Direct: 0.9 mg/dL — ABNORMAL HIGH (ref 0.0–0.2)

## 2021-05-23 LAB — FERRITIN: Ferritin: 82 ng/mL (ref 11–307)

## 2021-05-23 LAB — LIPASE, BLOOD: Lipase: 111 U/L — ABNORMAL HIGH (ref 11–51)

## 2021-05-23 LAB — AMMONIA: Ammonia: 61 umol/L — ABNORMAL HIGH (ref 9–35)

## 2021-05-23 LAB — COMPREHENSIVE METABOLIC PANEL
ALT: 29 U/L (ref 0–44)
AST: 198 U/L — ABNORMAL HIGH (ref 15–41)
Albumin: 3 g/dL — ABNORMAL LOW (ref 3.5–5.0)
Alkaline Phosphatase: 81 U/L (ref 38–126)
Anion gap: 12 (ref 5–15)
BUN: 11 mg/dL (ref 6–20)
CO2: 18 mmol/L — ABNORMAL LOW (ref 22–32)
Calcium: 7.8 mg/dL — ABNORMAL LOW (ref 8.9–10.3)
Chloride: 107 mmol/L (ref 98–111)
Creatinine, Ser: 0.72 mg/dL (ref 0.44–1.00)
GFR, Estimated: >60 mL/min (ref >60)
Glucose, Bld: 68 mg/dL — ABNORMAL LOW (ref 70–99)
Potassium: 4.3 mmol/L (ref 3.5–5.1)
Sodium: 137 mmol/L (ref 135–145)
Total Bilirubin: 2.4 mg/dL — ABNORMAL HIGH (ref 0.3–1.2)
Total Protein: 6.5 g/dL (ref 6.5–8.1)

## 2021-05-23 LAB — RETICULOCYTES
Immature Retic Fract: 24.7 % — ABNORMAL HIGH (ref 2.3–15.9)
RBC.: 2.89 MIL/uL — ABNORMAL LOW (ref 3.87–5.11)
Retic Count, Absolute: 96.8 10*3/uL (ref 19.0–186.0)
Retic Ct Pct: 3.4 % — ABNORMAL HIGH (ref 0.4–3.1)

## 2021-05-23 LAB — MAGNESIUM: Magnesium: 1.2 mg/dL — ABNORMAL LOW (ref 1.7–2.4)

## 2021-05-23 LAB — PROTIME-INR
INR: 1.2 (ref 0.8–1.2)
Prothrombin Time: 15.1 seconds (ref 11.4–15.2)

## 2021-05-23 LAB — ETHANOL: Alcohol, Ethyl (B): <10 mg/dL (ref <10)

## 2021-05-23 LAB — FOLATE: Folate: 4.8 ng/mL — ABNORMAL LOW (ref >5.9)

## 2021-05-23 LAB — PHOSPHORUS: Phosphorus: 2.6 mg/dL (ref 2.5–4.6)

## 2021-05-23 LAB — ACETAMINOPHEN LEVEL: Acetaminophen (Tylenol), Serum: <10 ug/mL — ABNORMAL LOW (ref 10–30)

## 2021-05-23 LAB — MRSA NEXT GEN BY PCR, NASAL: MRSA by PCR Next Gen: NOT DETECTED

## 2021-05-23 MED ORDER — LABETALOL HCL 5 MG/ML IV SOLN: 10.0000 mg | INTRAVENOUS | Status: DC | PRN

## 2021-05-23 MED ORDER — ALUM & MAG HYDROXIDE-SIMETH 200-200-20 MG/5ML PO SUSP: 30.0000 mL | Freq: Four times a day (QID) | ORAL | Status: DC | PRN

## 2021-05-23 MED ORDER — ALBUTEROL SULFATE (2.5 MG/3ML) 0.083% IN NEBU: 2.5000 mg | INHALATION_SOLUTION | RESPIRATORY_TRACT | Status: DC | PRN

## 2021-05-23 MED ORDER — NALOXONE HCL 0.4 MG/ML IJ SOLN: 0.4000 mg | INTRAMUSCULAR | Status: DC | PRN

## 2021-05-23 MED ORDER — ONDANSETRON HCL 4 MG/2ML IJ SOLN
4.0000 mg | Freq: Four times a day (QID) | INTRAMUSCULAR | Status: DC | PRN
Start: 2021-05-23 — End: 2021-06-02
  Administered 2021-05-23 – 2021-06-01 (×7): 4 mg via INTRAVENOUS
  Filled 2021-05-23 (×8): qty 2

## 2021-05-23 MED ORDER — SODIUM CHLORIDE 0.9 % IV SOLN: INTRAVENOUS | Status: DC

## 2021-05-23 MED ORDER — PANTOPRAZOLE SODIUM 40 MG IV SOLR
40.0000 mg | Freq: Two times a day (BID) | INTRAVENOUS | Status: DC
  Administered 2021-05-23 – 2021-06-01 (×19): 40 mg via INTRAVENOUS
  Filled 2021-05-23 (×20): qty 10

## 2021-05-23 MED ORDER — ACETAMINOPHEN 650 MG RE SUPP: 650.0000 mg | Freq: Four times a day (QID) | RECTAL | Status: DC | PRN

## 2021-05-23 MED ORDER — M.V.I. ADULT IV INJ
INJECTION | Freq: Once | INTRAVENOUS | Status: AC
  Filled 2021-05-23: qty 1000

## 2021-05-23 MED ORDER — METOPROLOL TARTRATE 5 MG/5ML IV SOLN
5.0000 mg | INTRAVENOUS | Status: DC | PRN
Start: 2021-05-23 — End: 2021-06-02

## 2021-05-23 MED ORDER — METOPROLOL SUCCINATE ER 25 MG PO TB24: 100.0000 mg | ORAL_TABLET | Freq: Every day | ORAL | Status: DC

## 2021-05-23 MED ORDER — MORPHINE SULFATE (PF) 4 MG/ML IV SOLN
4.0000 mg | INTRAVENOUS | Status: DC | PRN
  Administered 2021-05-23 – 2021-05-28 (×17): 4 mg via INTRAVENOUS
  Filled 2021-05-23 (×17): qty 1

## 2021-05-23 MED ORDER — LORAZEPAM 1 MG PO TABS
1.0000 mg | ORAL_TABLET | ORAL | Status: AC | PRN
  Administered 2021-05-25: 1 mg via ORAL
  Filled 2021-05-23: qty 1

## 2021-05-23 MED ORDER — LORAZEPAM 2 MG/ML IJ SOLN
1.0000 mg | INTRAMUSCULAR | Status: AC | PRN
  Administered 2021-05-23 (×6): 2 mg via INTRAVENOUS
  Administered 2021-05-23 (×2): 1 mg via INTRAVENOUS
  Administered 2021-05-24: 3 mg via INTRAVENOUS
  Administered 2021-05-24: 2 mg via INTRAVENOUS
  Administered 2021-05-25: 4 mg via INTRAVENOUS
  Administered 2021-05-25 (×2): 3 mg via INTRAVENOUS
  Filled 2021-05-23 (×2): qty 1
  Filled 2021-05-23 (×2): qty 2
  Filled 2021-05-23 (×5): qty 1
  Filled 2021-05-23: qty 2
  Filled 2021-05-23 (×2): qty 1
  Filled 2021-05-23: qty 2

## 2021-05-23 MED ORDER — MAGNESIUM SULFATE 4 GM/100ML IV SOLN
4.0000 g | Freq: Once | INTRAVENOUS | Status: AC
  Administered 2021-05-23: 4 g via INTRAVENOUS
  Filled 2021-05-23: qty 100

## 2021-05-23 MED ORDER — PANTOPRAZOLE SODIUM 40 MG IV SOLR
40.0000 mg | Freq: Every day | INTRAVENOUS | Status: DC
  Administered 2021-05-23: 40 mg via INTRAVENOUS
  Filled 2021-05-23: qty 10

## 2021-05-23 MED ORDER — AMLODIPINE BESYLATE 10 MG PO TABS
10.0000 mg | ORAL_TABLET | Freq: Every day | ORAL | Status: DC
  Administered 2021-05-23 – 2021-06-02 (×11): 10 mg via ORAL
  Filled 2021-05-23 (×4): qty 1
  Filled 2021-05-23: qty 2
  Filled 2021-05-23 (×6): qty 1

## 2021-05-23 MED ORDER — ACETAMINOPHEN 325 MG PO TABS
650.0000 mg | ORAL_TABLET | Freq: Four times a day (QID) | ORAL | Status: DC | PRN
  Administered 2021-05-25 – 2021-05-29 (×3): 650 mg via ORAL
  Filled 2021-05-23 (×4): qty 2

## 2021-05-23 MED ORDER — METOPROLOL TARTRATE 50 MG PO TABS
50.0000 mg | ORAL_TABLET | Freq: Two times a day (BID) | ORAL | Status: DC
Start: 2021-05-23 — End: 2021-06-02
  Administered 2021-05-23 – 2021-06-02 (×21): 50 mg via ORAL
  Filled 2021-05-23 (×20): qty 1
  Filled 2021-05-23: qty 2

## 2021-05-23 NOTE — Progress Notes (Addendum)
PROGRESS NOTE        PATIENT DETAILS Name: Nicole Hughes Age: 51 y.o. Sex: female Date of Birth: 11/27/1970 Admit Date: 05/22/2021 Admitting Physician Angie FavaJustin B Howerter, DO PCP:Pcp, No  Brief Summary: Patient is a 51 y.o.  female with history of EtOH use, GERD, HTN-who presented to the ED with epigastric pain-she was thought to have pancreatitis-alcohol withdrawal-and subsequently admitted to the hospitalist service.   Significant events: 5/17>> admit to [facility]-epigastric pain-EtOH withdrawal  Significant studies: 5/17>> CXR: No PNA 5/17>> CT abdomen/pelvis: No acute abnormality. 5/17>> lipase: 141  Significant microbiology data: 5/17>> COVID/influenza PCR: Negative  Procedures: None  Consults: None   Subjective: Sleepy-had just received Ativan.  Continues to complain of epigastric pain.  Objective: Vitals: Blood pressure (!) 162/104, pulse (!) 133, temperature 98.2 F (36.8 C), temperature source Oral, resp. rate 14, SpO2 100 %.   Exam: Gen Exam: Sleepy-but not in any distress. HEENT:atraumatic, normocephalic Chest: B/L clear to auscultation anteriorly CVS:S1S2 regular Abdomen:soft non tender, non distended Extremities:no edema Neurology: Non focal Skin: no rash  Pertinent Labs/Radiology:    Latest Ref Rng & Units 05/23/2021    2:37 AM 05/22/2021    4:33 PM 12/17/2020    1:56 AM  CBC  WBC 4.0 - 10.5 K/uL 4.5   6.0   2.9    Hemoglobin 12.0 - 15.0 g/dL 8.7   9.5   8.0    Hematocrit 36.0 - 46.0 % 27.3   29.7   24.0    Platelets 150 - 400 K/uL 99   130   112      Lab Results  Component Value Date   NA 137 05/23/2021   K 4.3 05/23/2021   CL 107 05/23/2021   CO2 18 (L) 05/23/2021      Assessment/Plan: Epigastric pain-likely due to acute alcoholic pancreatitis: Although no CT evidence of pancreatitis-has epigastric pain and elevated lipase levels-plan is to manage with supportive care-continue clear liquids for now-continue IVF.   On PPI-in case pain is also due to alcoholic gastritis.  We will keep on clear liquids today-and slowly advance.   Alcoholic hepatitis: Mild-supportive care.  EtOH withdrawal: Tachycardic-sleepy/lethargic as she had  just received Ativan but before that was relatively awake/alert per nursing staff.  Continue Ativan per CIWA protocol.  Check ammonia levels.  HTN: BP relatively stable-continue metoprolol and amlodipine.  Hypomagnesemia: Replete and recheck  Normocytic anemia: Chronic issue-watch closely.  Transfuse if Hb <7.  Probably due to bone marrow suppression from alcohol use.  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  Underweight: Estimated body mass index is 23.68 kg/m as calculated from the following:   Height as of 07/08/20: 5\' 6"  (1.676 m).   Weight as of 12/20/20: 66.5 kg.   Code status:   Code Status: Full Code   DVT Prophylaxis: SCDs Start: 05/23/21 0017   Family Communication: None at bedside   Disposition Plan: Status is: Inpatient Remains inpatient appropriate because: Epigastric pain-ongoing withdrawal symptoms-not stable for discharge.   Planned Discharge Destination:Home   Diet: Diet Order             Diet clear liquid Room service appropriate? Yes; Fluid consistency: Thin  Diet effective now                     Antimicrobial agents: Anti-infectives (From admission, onward)  None        MEDICATIONS: Scheduled Meds:  amLODipine  10 mg Oral Daily   metoprolol tartrate  50 mg Oral BID   pantoprazole (PROTONIX) IV  40 mg Intravenous Q12H   Continuous Infusions:  sodium chloride 150 mL/hr at 05/23/21 0841   magnesium sulfate bolus IVPB 4 g (05/23/21 0844)   PRN Meds:.acetaminophen **OR** acetaminophen, albuterol, alum & mag hydroxide-simeth, LORazepam **OR** LORazepam, metoprolol tartrate, morphine injection, naLOXone (NARCAN)  injection, ondansetron (ZOFRAN) IV   I have personally  reviewed following labs and imaging studies  LABORATORY DATA: CBC: Recent Labs  Lab 05/22/21 1633 05/23/21 0237  WBC 6.0 4.5  NEUTROABS  --  2.7  HGB 9.5* 8.7*  HCT 29.7* 27.3*  MCV 94.6 94.5  PLT 130* 99*    Basic Metabolic Panel: Recent Labs  Lab 05/22/21 1633 05/23/21 0237  NA 136 137  K 4.9 4.3  CL 107 107  CO2 20* 18*  GLUCOSE 83 68*  BUN 12 11  CREATININE 0.81 0.72  CALCIUM 8.8* 7.8*  MG  --  1.2*  PHOS  --  2.6    GFR: CrCl cannot be calculated (Unknown ideal weight.).  Liver Function Tests: Recent Labs  Lab 05/22/21 1633 05/23/21 0237  AST 264* 198*  ALT 33 29  ALKPHOS 90 81  BILITOT 1.6* 2.4*  PROT 7.4 6.5  ALBUMIN 3.5 3.0*   Recent Labs  Lab 05/22/21 1633 05/23/21 0237  LIPASE 141* 111*   No results for input(s): AMMONIA in the last 168 hours.  Coagulation Profile: Recent Labs  Lab 05/23/21 0237  INR 1.2    Cardiac Enzymes: No results for input(s): CKTOTAL, CKMB, CKMBINDEX, TROPONINI in the last 168 hours.  BNP (last 3 results) No results for input(s): PROBNP in the last 8760 hours.  Lipid Profile: No results for input(s): CHOL, HDL, LDLCALC, TRIG, CHOLHDL, LDLDIRECT in the last 72 hours.  Thyroid Function Tests: No results for input(s): TSH, T4TOTAL, FREET4, T3FREE, THYROIDAB in the last 72 hours.  Anemia Panel: Recent Labs    05/22/21 1633 05/23/21 0237  VITAMINB12 786  --   FOLATE  --  4.8*  FERRITIN  --  82  TIBC  --  316  IRON  --  81  RETICCTPCT  --  3.4*    Urine analysis:    Component Value Date/Time   COLORURINE YELLOW 05/22/2021 2102   APPEARANCEUR CLEAR 05/22/2021 2102   LABSPEC 1.038 (H) 05/22/2021 2102   PHURINE 6.0 05/22/2021 2102   GLUCOSEU NEGATIVE 05/22/2021 2102   HGBUR SMALL (A) 05/22/2021 2102   BILIRUBINUR NEGATIVE 05/22/2021 2102   BILIRUBINUR trace 12/20/2012 1553   KETONESUR NEGATIVE 05/22/2021 2102   PROTEINUR NEGATIVE 05/22/2021 2102   UROBILINOGEN 0.2 06/22/2014 1147   NITRITE  NEGATIVE 05/22/2021 2102   LEUKOCYTESUR TRACE (A) 05/22/2021 2102    Sepsis Labs: Lactic Acid, Venous    Component Value Date/Time   LATICACIDVEN 1.0 12/15/2020 0140    MICROBIOLOGY: Recent Results (from the past 240 hour(s))  Resp Panel by RT-PCR (Flu A&B, Covid) Nasopharyngeal Swab     Status: None   Collection Time: 05/22/21  4:20 PM   Specimen: Nasopharyngeal Swab; Nasopharyngeal(NP) swabs in vial transport medium  Result Value Ref Range Status   SARS Coronavirus 2 by RT PCR NEGATIVE NEGATIVE Final    Comment: (NOTE) SARS-CoV-2 target nucleic acids are NOT DETECTED.  The SARS-CoV-2 RNA is generally detectable in upper respiratory specimens during the acute phase of infection. The  lowest concentration of SARS-CoV-2 viral copies this assay can detect is 138 copies/mL. A negative result does not preclude SARS-Cov-2 infection and should not be used as the sole basis for treatment or other patient management decisions. A negative result may occur with  improper specimen collection/handling, submission of specimen other than nasopharyngeal swab, presence of viral mutation(s) within the areas targeted by this assay, and inadequate number of viral copies(<138 copies/mL). A negative result must be combined with clinical observations, patient history, and epidemiological information. The expected result is Negative.  Fact Sheet for Patients:  BloggerCourse.com  Fact Sheet for Healthcare Providers:  SeriousBroker.it  This test is no t yet approved or cleared by the [place] FDA and  has been authorized for detection and/or diagnosis of SARS-CoV-2 by FDA under an Emergency Use Authorization (EUA). This EUA will remain  in effect (meaning this test can be used) for the duration of the COVID-19 declaration under Section 564(b)(1) of the Act, 21 U.S.C.section 360bbb-3(b)(1), unless the authorization is terminated  or revoked  sooner.       Influenza A by PCR NEGATIVE NEGATIVE Final   Influenza B by PCR NEGATIVE NEGATIVE Final    Comment: (NOTE) The Xpert Xpress SARS-CoV-2/FLU/RSV plus assay is intended as an aid in the diagnosis of influenza from Nasopharyngeal swab specimens and should not be used as a sole basis for treatment. Nasal washings and aspirates are unacceptable for Xpert Xpress SARS-CoV-2/FLU/RSV testing.  Fact Sheet for Patients: BloggerCourse.com  Fact Sheet for Healthcare Providers: SeriousBroker.it  This test is not yet approved or cleared by the [place] FDA and has been authorized for detection and/or diagnosis of SARS-CoV-2 by FDA under an Emergency Use Authorization (EUA). This EUA will remain in effect (meaning this test can be used) for the duration of the COVID-19 declaration under Section 564(b)(1) of the Act, 21 U.S.C. section 360bbb-3(b)(1), unless the authorization is terminated or revoked.  Performed at [facility], [address] [place], [place] [address]     RADIOLOGY STUDIES/RESULTS: CT Abdomen Pelvis W Contrast  Result Date: 05/22/2021 CLINICAL DATA:  Acute abdominal pain with elevated lipase, initial encounter EXAM: CT ABDOMEN AND PELVIS WITH CONTRAST TECHNIQUE: Multidetector CT imaging of the abdomen and pelvis was performed using the standard protocol following bolus administration of intravenous contrast. RADIATION DOSE REDUCTION: This exam was performed according to the departmental dose-optimization program which includes automated exposure control, adjustment of the mA and/or kV according to patient size and/or use of iterative reconstruction technique. CONTRAST:  75 mL Omnipaque 350. COMPARISON:  07/08/2020 FINDINGS: Lower chest: Mild dependent atelectatic changes are noted. No focal infiltrate is seen. Hepatobiliary: Fatty infiltration of the liver is noted. The gallbladder is well distended without  evidence of cholelithiasis. No biliary ductal dilatation is seen. Pancreas: Unremarkable. No pancreatic ductal dilatation or surrounding inflammatory changes. Spleen: Normal in size without focal abnormality. Adrenals/Urinary Tract: Adrenal glands are within normal limits. Kidneys show normal enhancement pattern bilaterally. No renal calculi or obstructive changes are seen. Normal excretion is noted on delayed images. Bladder is partially distended. Stomach/Bowel: The appendix is within normal limits. No obstructive or inflammatory changes of the colon are seen. Stomach is within normal limits. Small bowel shows no focal abnormality. Vascular/Lymphatic: Aortic atherosclerosis. No enlarged abdominal or pelvic lymph nodes. Reproductive: Uterus and bilateral adnexa are unremarkable. Other: No abdominal wall hernia or abnormality. No abdominopelvic ascites. Musculoskeletal: No acute or significant osseous findings. IMPRESSION: Fatty liver. No acute abnormality is noted. Specifically the  pancreas is within normal limits. Electronically Signed   By: Alcide Clever M.D.   On: 05/22/2021 19:36   DG Chest Port 1 View  Result Date: 05/22/2021 CLINICAL DATA:  cp EXAM: PORTABLE CHEST 1 VIEW COMPARISON:  December 16, 2020 FINDINGS: The cardiomediastinal silhouette is unchanged in contour.Unchanged elevation of the RIGHT hemidiaphragm. No pleural effusion. No pneumothorax. No acute pleuroparenchymal abnormality. Visualized abdomen is unremarkable. IMPRESSION: No acute cardiopulmonary abnormality. Electronically Signed   By: Meda Klinefelter M.D.   On: 05/22/2021 18:54     LOS: 0 days   Jeoffrey Massed, MD  [facility]    To contact the attending provider between 7A-7P or the covering provider during after hours 7P-7A, please log into the web site [email or web address] and access using universal [place] password for that web site. If you do not have the password, please call the hospital operator.  05/23/2021,  9:55 AM

## 2021-05-23 NOTE — Plan of Care (Signed)

## 2021-05-23 NOTE — Assessment & Plan Note (Signed)
  #)   Chronic transaminitis: In setting of documented history of fatty liver/chronic alcohol abuse, the patient presents with AST predominant transaminitis, consistent with prior liver enzymes, and without clinical radiographic evidence to suggest cholestatic pattern/process.  CT abdomen/pelvis shows no evidence of acute intra-abdominal/intrapelvic process, including no evidence of gallbladder pathology nor any evidence of pancreatic pathology, as further detailed above.  Of note, COVID-19/influenza PCR negative.  Plan: Repeat CMP in the morning.  Check urinary drug screen, INR, direct bilirubin.  Check ferritin, acetaminophen level.  Repeat lipase in the morning.  Further evaluation management of chronic alcohol abuse, as further detailed above.

## 2021-05-23 NOTE — ED Notes (Signed)
Pt assisted to bathroom x1. Assisted back to bed, call light within reach. Placed on cardiac monitor.

## 2021-05-23 NOTE — ED Notes (Signed)
Help get patient cleaned up bed changed placed a brief patient is resting with call bell in reach

## 2021-05-23 NOTE — Assessment & Plan Note (Signed)
 #)   Acute epigastric pain: 1 to 2 days of sharp, nonradiating epigastric discomfort worse with palpation of the abdomen, but in the absence of any acute peritoneal signs, and associated with nausea/vomiting.  CT abdomen/pelvis shows no evidence of acute process, including no evidence of acute gallbladder pathology nor any evidence of pancreatic pathology.  Mildly elevated liver enzymes are consistent with her history of chronic AST predominant transaminitis, without any laboratory or radiographic evidence to suggest cholestatic process.  Presentation appears less suggestive of acute alcoholic hepatitis. mildly elevated lipase is on par with most recent prior value from December 2022, as further quantified above.   In the context of her history of chronic alcohol abuse, differential includes gastritis versus peptic ulcer disease versus esophagitis, without clinical evidence to suggest Boerhaave's.  Differential also includes GERD, in the setting of a documented history of such, will noting that the patient is not on any scheduled H2 blocker nor any PPI as an outpatient.  She has received a single dose of IV Pepcid in the ED this evening.  will add IV Protonix, and monitor for response. no evidence to suggest acute gastrointestinal bleed at this time, but will closely monitor ensuing hemoglobin trend, particularly in the context of documented history of chronic anemia, as further detailed below.  Differential also includes possibility of sequela of outpatient marijuana use. Will check UDS to further evaluate.  No evidence of associated chest pain, and ACS is felt to be less likely, particular in the context of nonelevated high-sensitivity troponin I x2 values as well as EKG showing no evidence of acute ischemic changes, including no evidence of ST elevation.  Present chest x-ray shows no evidence of acute cardiopulmonary process.    Plan: Clear liquid diet.  Continuous IV fluids.  Prn IV morphine.  Prn IV  Zofran.  Add on serum magnesium level.  Repeat CMP and CBC in the morning.  Protonix 40 mg IV daily.  Check urinary drug screen.  Repeat lipase level in the morning.  Check direct bilirubin.

## 2021-05-23 NOTE — Assessment & Plan Note (Signed)
 #)   Chronic Alcohol Abuse: the patient reports typical daily alcohol consumption of 1/5 of liquor, and that typical daily alcohol consumption of this volume has been occurring for multiple years.  She conveys her interest in discontinuing her alcohol consumption, and conveys that this was self initiated as an outpatient, with most recent alcohol consumption occurring on the evening of 05/21/2021, as further detailed above.  Furthermore, she conveys interest in learning about options available for associated rehabilitation.  Presentation suspicious for potential mild, early alcohol withdrawal symptoms in the form of mild anxiety, mild tachycardia, hypertension, without evidence of DT's at this time.  She is certainly at risk for progression of alcohol withdrawal symptoms, and will monitor closely via initiation of symptoms based CIWA protocol.  No reported history of alcohol withdrawal seizures.    Plan: counseled the patient on the importance of reduction in alcohol consumption. Consult to transition of care team placed. Close monitoring of ensuing BP and HR via routine VS. Symptoms-based CIWA protocol with prn Ativan ordered. Seizure precautions. Telemetry. Add-on serum Mg level. Check serum phosphorus level. Repeat CMP in the morning. Check INR. Add-on serum ethanol level. UDS. Banana bag x1 has been ordered, with plan for additional supplementation of thiamine, folic acid, multivitamin including IV versus p.o, to occur tomorrow depending upon patient's ability to tolerate p.o. at that time.  As needed IV labetalol.  Continuous normal saline in addition to IV fluids administered via banana bag.

## 2021-05-23 NOTE — Assessment & Plan Note (Signed)
(  please see chronic alcohol abuse)

## 2021-05-23 NOTE — Assessment & Plan Note (Signed)
 #)   Essential Hypertension: documented h/o such, with outpatient antihypertensive regimen including amlodipine as well as metoprolol succinate.  Blood pressures in the ED today:  158/93 - 168/104, with potential contribution from early alcohol withdrawal, as further detailed above.   Plan: Close monitoring of subsequent BP via routine VS. resume home amlodipine as well as metoprolol succinate.  As needed IV labetalol for systolic blood pressure greater than 180 or diastolic blood pressure greater than 110 mmHg.

## 2021-05-23 NOTE — ED Notes (Signed)
Seizure pads applied to pt's bed and suction set up.

## 2021-05-23 NOTE — Assessment & Plan Note (Signed)
  #)   Chronic anemia: Documented history of such associated baseline hemoglobin 8-11, with presenting hemoglobin found to be consistent with this baseline range, in the absence of any evidence of acute bleed.  Of note, presenting hemoglobin associated with normocytic/normochromic findings as well as nonelevated RDW.  Suspect contribution from chronic alcohol abuse, although in the absence of macrocytic findings as noted.  We will further assess the additional laboratory evaluation, as further detailed below.  Plan: Add on the following: Iron studies, MMA level, folic acid level, reticulocyte count.  Check INR.  Repeat CBC in the morning.

## 2021-05-23 NOTE — ED Notes (Signed)
RN cleaned pt up

## 2021-05-23 NOTE — Progress Notes (Signed)
Patient has arrived to the unit. Admission assessment complete on client. Skin swarm complete. Patient has a hematoma to her coccyx. No complaints of pain have been made at this time. CIWA precautions maintained. Ativan PRN administered as needed. Continuous IV fluids initiated. New IV placed to clients right forearm.

## 2021-05-23 NOTE — ED Notes (Signed)
Pt sleeping comfortably in her bed. Call light within reach

## 2021-05-23 NOTE — H&P (Signed)
History and Physical    PLEASE NOTE THAT DRAGON DICTATION SOFTWARE WAS USED IN THE CONSTRUCTION OF THIS NOTE.   Nicole Hughes [id number] DOB: 1970/04/10 DOA: 05/22/2021  PCP: Pcp, No (will further assess) Patient coming from: home   I have personally briefly reviewed patient's old medical records in [place]  Chief Complaint: Epigastric pain  HPI: Nicole Hughes is a 51 y.o. female with medical history significant for chronic alcohol abuse, GERD, essential hypertension, chronic anemia associated baseline hemoglobin 8-11, who is admitted to [facility] on 05/22/2021 with acute epigastric pain after presenting from home to [facility] ED complaining of such.   The patient reports 1 to 2 days constant sharp, nonradiating epigastric pain, which worsens with palpation over the epigastrium.  Denies any recent preceding trauma, nor any recent traveling.  Reports associated intermittent nausea resulting in 2-3 episodes of nonbloody, nonbilious emesis over the course the last day, with most recent such episode occurring in the emergency department this evening.  Denies any associated melena or hematochezia.  Not associate with any subjective fever, chills, rigors, or generalized myalgias.  Notices with any chest pain, shortness of breath, palpitations, diaphoresis, dizziness, presyncope, or syncope.  No associated hemoptysis.  No recent diarrhea.  The patient acknowledges chronic alcohol abuse, in which she typically consumes 1/5 of liquor per day.  She conveys her desire to quit drinking, and independently stopped consuming alcohol on the evening of 05/21/2021, confirming most recent alcohol consumed at that time.  She also conveys her amenability to learning about options available for associated rehabilitation.  Denies any concomitant NSAID use, nor any use of blood thinners, including aspirin.   She has a documented history of GERD, but is not on any scheduled H2 blocker nor any PPI as  an outpatient.  Medical history also notable for DVT/PE in 2000, which appears to be considered provoked as it occurred during pregnancy.  She denies any ensuing DVT/PE, and confirms that she is not chronically anticoagulated.  Denies any recent calf tenderness or new lower extremity erythema.  Per chart review, most recent prior liver enzymes, which were checked on 12/17/2020, were notable for the following: AST 140, ALT 27, total bilirubin 2.7.  Additionally, most recent prior lipase noted to be 129 in December 2022.    ED Course:  Vital signs in the ED were notable for the following: Afebrile; initial heart rate 132, which decreased to 112 following interval IV fluids as well as prn IV Ativan, as further detailed below; blood pressure 158/93 - 168/104; respiratory rate 14-18, oxygen saturation 97 to 100% on room air.  Labs were notable for the following: CMP notable for the following: Sodium 136, bicarbonate 20, anion gap 9, BUN 12, creatinine 0.81, albumin 3.5, alkaline phosphatase 90, AST 264, ALT 32, total bilirubin 1.6.  Lipase 141.  High-sensitivity troponin I x2 values were both found to be 6.  COVID-19/influenza PCR negative.  CBC notable for white blood cell count 6, hemoglobin 9.5 associated with normocytic/normochromic findings as well as nonelevated RDW, compared to most recent prior hemoglobin data point 8.0 on 12/17/2020, platelet count 130.  Urinalysis notable for no white blood cells, was positive for hyaline casts and also associated specific gravity 1.038.  Imaging and additional notable ED work-up: EKG, comparison to most recent prior from 12/20/2020 shows sinus tachycardia with heart rate 103, normal intervals, nonspecific T wave inversion in leads III, V1, V2, of which T wave inversion in V1 was also noted to  be present on most recent prior EKG, while showing no evidence of ST changes, including no evidence of ST elevation.  Chest x-ray showed no evidence of acute  cardiopulmonary process, including no evidence of infiltrate, edema, or effusion.  CT abdomen/pelvis with contrast showed evidence of fatty liver, but otherwise showed no evidence of acute intra-abdominal or acute intrapelvic process, including no evidence of gallbladder wall thickening, pericholecystic fluid, choledocholithiasis, or g dilation of the common bile duct, also demonstrating no evidence of pancreatic pathology, nor any evidence of bowel obstruction, abscess, or perforation.  While in the ED, the following were administered: Ativan 2 mg IV x3 doses, Reglan 5 mg IV x1, morphine 4 mg IV x1, Pepcid 20 mg IV x1, as well as 1 L normal saline bolus.  Subsequently, the patient was admitted for further evaluation and management of acute epigastric pain with clinical suspicion for early evidence of alcohol withdrawal.      Review of Systems: As per HPI otherwise 10 point review of systems negative.   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) 08/1998   S/P childbirth    Past Surgical History:  Procedure Laterality Date   [place]  2010   LAPAROSCOPY N/A 09/24/2012   Procedure: OPERATIVE LAPROSCOPY WITH LYSIS OF ADHESIONS;  Surgeon: Thornell Sartorius, MD;  Location: [facility];  Service: Gynecology;  Laterality: N/A;   TONSILLECTOMY  1984   TUBAL LIGATION  2010    Social History:  reports that she has never smoked. She has  never used smokeless tobacco. She reports that she does not currently use alcohol. She reports current drug use. Frequency: 1.00 time per week. Drug: Marijuana.   No Known Allergies  Family History  Problem Relation Age of Onset   Cancer - Lung Mother    Heart failure Father    Hypertension Father     Family history reviewed and not pertinent    Prior to Admission medications   Medication Sig Start Date End Date Taking? Authorizing Provider  amLODipine (NORVASC) 10 MG tablet Take 1 tablet (10 mg total) by mouth daily. 09/05/20  Yes Tower, Wynelle Fanny, MD  Carboxymethylcellul-Glycerin (CLEAR EYES FOR DRY EYES OP) Place 1 drop into both eyes daily as needed (dryness).   Yes [provider]  lactulose, encephalopathy, (CHRONULAC) 10 GM/15ML SOLN Take 30 mLs (20 g total) by mouth 2 (two) times daily. 12/20/20  Yes Charlynne Cousins, MD  metoprolol succinate (TOPROL-XL) 100 MG 24 hr tablet TAKE 1 TABLET BY MOUTH ONCE DAILY WITH A MEAL OR IMMEDIATELY FOLLOWING A MEAL. APPOINTMENT REQUIRED FOR FUTURE REFILLS Patient taking differently: Take 100 mg by mouth daily. 05/09/21  Yes Tower, Wynelle Fanny, MD  traZODone (DESYREL) 50 MG tablet Take 2 tablets (100 mg  total) by mouth at bedtime as needed for sleep. 09/05/20  Yes Tower, Wynelle Fanny, MD     Objective    Physical Exam: Vitals:   05/22/21 2325 05/22/21 2340 05/22/21 2345 05/23/21 0001  BP: (!) 158/93 (!) 149/116 (!) 149/116   Pulse: (!) 111 (!) 112 (!) 112   Resp: 13  16   Temp:      TempSrc:      SpO2: 98%  99% 99%    General: appears to be stated age; alert, oriented Skin: warm, dry, no rash Head:  AT/[place] Mouth:  Oral mucosa membranes appear dry, normal dentition Neck: supple; trachea midline Heart: Mildly tachycardic, but regular; did not appreciate any M/R/G Lungs: CTAB, did not appreciate any wheezes, rales, or rhonchi Abdomen: + BS; soft, ND, NT Vascular: 2+ pedal pulses b/l; 2+ radial pulses b/l Extremities: no peripheral  edema, no muscle wasting Neuro: strength and sensation intact in upper and lower extremities b/l   Labs on Admission: I have personally reviewed following labs and imaging studies  CBC: Recent Labs  Lab 05/22/21 1633  WBC 6.0  HGB 9.5*  HCT 29.7*  MCV 94.6  PLT [id number]*   Basic Metabolic Panel: Recent Labs  Lab 05/22/21 1633  NA 136  K 4.9  CL 107  CO2 20*  GLUCOSE 83  BUN 12  CREATININE 0.81  CALCIUM 8.8*   GFR: CrCl cannot be calculated (Unknown ideal weight.). Liver Function Tests: Recent Labs  Lab 05/22/21 1633  AST 264*  ALT 33  ALKPHOS 90  BILITOT 1.6*  PROT 7.4  ALBUMIN 3.5   Recent Labs  Lab 05/22/21 1633  LIPASE 141*   No results for input(s): AMMONIA in the last 168 hours. Coagulation Profile: No results for input(s): INR, PROTIME in the last 168 hours. Cardiac Enzymes: No results for input(s): CKTOTAL, CKMB, CKMBINDEX, TROPONINI in the last 168 hours. BNP (last 3 results) No results for input(s): PROBNP in the last 8760 hours. HbA1C: No results for input(s): HGBA1C in the last 72 hours. CBG: No results for input(s): GLUCAP in the last 168 hours. Lipid Profile: No results for input(s): CHOL, HDL, LDLCALC, TRIG, CHOLHDL, LDLDIRECT in the last 72 hours. Thyroid Function Tests: No results for input(s): TSH, T4TOTAL, FREET4, T3FREE, THYROIDAB in the last 72 hours. Anemia Panel: Recent Labs    05/22/21 1633  VITAMINB12 786   Urine analysis:    Component Value Date/Time   COLORURINE YELLOW 05/22/2021 2102   APPEARANCEUR CLEAR 05/22/2021 2102   LABSPEC 1.038 (H) 05/22/2021 2102   PHURINE 6.0 05/22/2021 2102   GLUCOSEU NEGATIVE 05/22/2021 2102   HGBUR SMALL (A) 05/22/2021 2102   BILIRUBINUR NEGATIVE 05/22/2021 2102   BILIRUBINUR trace 12/20/2012 1553   KETONESUR NEGATIVE 05/22/2021 2102   PROTEINUR NEGATIVE 05/22/2021 2102   UROBILINOGEN 0.2 06/22/2014 1147   NITRITE NEGATIVE 05/22/2021 2102   LEUKOCYTESUR TRACE (A) 05/22/2021 2102     Radiological Exams on Admission: CT Abdomen Pelvis W Contrast  Result Date: 05/22/2021 CLINICAL DATA:  Acute abdominal pain with elevated lipase, initial encounter EXAM: CT ABDOMEN AND PELVIS WITH CONTRAST TECHNIQUE: Multidetector CT imaging of the abdomen and pelvis was performed using the standard protocol following bolus administration of intravenous contrast. RADIATION DOSE REDUCTION: This exam was performed according to the departmental dose-optimization program which includes automated exposure control, adjustment of the mA and/or kV according to patient size and/or use of iterative reconstruction technique. CONTRAST:  75 mL Omnipaque 350. COMPARISON:  07/08/2020 FINDINGS: Lower chest:  Mild dependent atelectatic changes are noted. No focal infiltrate is seen. Hepatobiliary: Fatty infiltration of the liver is noted. The gallbladder is well distended without evidence of cholelithiasis. No biliary ductal dilatation is seen. Pancreas: Unremarkable. No pancreatic ductal dilatation or surrounding inflammatory changes. Spleen: Normal in size without focal abnormality. Adrenals/Urinary Tract: Adrenal glands are within normal limits. Kidneys show normal enhancement pattern bilaterally. No renal calculi or obstructive changes are seen. Normal excretion is noted on delayed images. Bladder is partially distended. Stomach/Bowel: The appendix is within normal limits. No obstructive or inflammatory changes of the colon are seen. Stomach is within normal limits. Small bowel shows no focal abnormality. Vascular/Lymphatic: Aortic atherosclerosis. No enlarged abdominal or pelvic lymph nodes. Reproductive: Uterus and bilateral adnexa are unremarkable. Other: No abdominal wall hernia or abnormality. No abdominopelvic ascites. Musculoskeletal: No acute or significant osseous findings. IMPRESSION: Fatty liver. No acute abnormality is noted. Specifically the pancreas is within normal limits. Electronically Signed   By: Inez Catalina M.D.   On: 05/22/2021 19:36   DG Chest Port 1 View  Result Date: 05/22/2021 CLINICAL DATA:  cp EXAM: PORTABLE CHEST 1 VIEW COMPARISON:  December 16, 2020 FINDINGS: The cardiomediastinal silhouette is unchanged in contour.Unchanged elevation of the RIGHT hemidiaphragm. No pleural effusion. No pneumothorax. No acute pleuroparenchymal abnormality. Visualized abdomen is unremarkable. IMPRESSION: No acute cardiopulmonary abnormality. Electronically Signed   By: Valentino Saxon M.D.   On: 05/22/2021 18:54     EKG: Independently reviewed, with result as described above.    Assessment/Plan   Principal Problem:   Epigastric pain Active Problems:   Essential hypertension   Chronic alcohol abuse   Chronic anemia   Alcohol withdrawal (HCC)   Transaminitis      #) Acute epigastric pain: 1 to 2 days of sharp, nonradiating epigastric discomfort worse with palpation of the abdomen, but in the absence of any acute peritoneal signs, and associated with nausea/vomiting.  CT abdomen/pelvis shows no evidence of acute process, including no evidence of acute gallbladder pathology nor any evidence of pancreatic pathology.  Mildly elevated liver enzymes are consistent with her history of chronic AST predominant transaminitis, without any laboratory or radiographic evidence to suggest cholestatic process.  Presentation appears less suggestive of acute alcoholic hepatitis. mildly elevated lipase is on par with most recent prior value from December 2022, as further quantified above.   In the context of her history of chronic alcohol abuse, differential includes gastritis versus peptic ulcer disease versus esophagitis, without clinical evidence to suggest Boerhaave's.  Differential also includes GERD, in the setting of a documented history of such, will noting that the patient is not on any scheduled H2 blocker nor any PPI as an outpatient.  She has received a single dose of IV Pepcid in the ED this evening.   will add IV Protonix, and monitor for response. no evidence to suggest acute gastrointestinal bleed at this time, but will closely monitor ensuing hemoglobin trend, particularly in the context of documented history of chronic anemia, as further detailed below.  Differential also includes possibility of sequela of outpatient marijuana use. Will check UDS to further evaluate.  No evidence of associated chest pain, and ACS is felt to be less likely, particular in the context of nonelevated high-sensitivity troponin I x2 values as well as EKG showing no evidence of acute ischemic changes, including no evidence of ST elevation.  Present chest x-ray shows no evidence of acute cardiopulmonary process.    Plan: Clear liquid diet.  Continuous IV  fluids.  Prn IV morphine.  Prn IV Zofran.  Add on serum magnesium level.  Repeat CMP and CBC in the morning.  Protonix 40 mg IV daily.  Check urinary drug screen.  Repeat lipase level in the morning.  Check direct bilirubin.        #) Chronic Alcohol Abuse: the patient reports typical daily alcohol consumption of 1/5 of liquor, and that typical daily alcohol consumption of this volume has been occurring for multiple years.  She conveys her interest in discontinuing her alcohol consumption, and conveys that this was self initiated as an outpatient, with most recent alcohol consumption occurring on the evening of 05/21/2021, as further detailed above.  Furthermore, she conveys interest in learning about options available for associated rehabilitation.  Presentation suspicious for potential mild, early alcohol withdrawal symptoms in the form of mild anxiety, mild tachycardia, hypertension, without evidence of DT's at this time.  She is certainly at risk for progression of alcohol withdrawal symptoms, and will monitor closely via initiation of symptoms based CIWA protocol.  No reported history of alcohol withdrawal seizures.    Plan: counseled the patient on the  importance of reduction in alcohol consumption. Consult to transition of care team placed. Close monitoring of ensuing BP and HR via routine VS. Symptoms-based CIWA protocol with prn Ativan ordered. Seizure precautions. Telemetry. Add-on serum Mg level. Check serum phosphorus level. Repeat CMP in the morning. Check INR. Add-on serum ethanol level. UDS. Banana bag x1 has been ordered, with plan for additional supplementation of thiamine, folic acid, multivitamin including IV versus p.o, to occur tomorrow depending upon patient's ability to tolerate p.o. at that time.  As needed IV labetalol.  Continuous normal saline in addition to IV fluids administered via banana bag.        #) Chronic transaminitis: In setting of documented history of fatty liver/chronic alcohol abuse, the patient presents with AST predominant transaminitis, consistent with prior liver enzymes, and without clinical radiographic evidence to suggest cholestatic pattern/process.  CT abdomen/pelvis shows no evidence of acute intra-abdominal/intrapelvic process, including no evidence of gallbladder pathology nor any evidence of pancreatic pathology, as further detailed above.  Of note, COVID-19/influenza PCR negative.  Plan: Repeat CMP in the morning.  Check urinary drug screen, INR, direct bilirubin.  Check ferritin, acetaminophen level.  Repeat lipase in the morning.  Further evaluation management of chronic alcohol abuse, as further detailed above.           #) Essential Hypertension: documented h/o such, with outpatient antihypertensive regimen including amlodipine as well as metoprolol succinate.  Blood pressures in the ED today:  158/93 - 168/104, with potential contribution from early alcohol withdrawal, as further detailed above.   Plan: Close monitoring of subsequent BP via routine VS. resume home amlodipine as well as metoprolol succinate.  As needed IV labetalol for systolic blood pressure greater than 99991111 or diastolic  blood pressure greater than 110 mmHg.          #) Chronic anemia: Documented history of such associated baseline hemoglobin 8-11, with presenting hemoglobin found to be consistent with this baseline range, in the absence of any evidence of acute bleed.  Of note, presenting hemoglobin associated with normocytic/normochromic findings as well as nonelevated RDW.  Suspect contribution from chronic alcohol abuse, although in the absence of macrocytic findings as noted.  We will further assess the additional laboratory evaluation, as further detailed below.  Plan: Add on the following: Iron studies, MMA level, folic acid level, reticulocyte count.  Check INR.  Repeat CBC in the morning.          DVT prophylaxis: SCD's   Code Status: Full code Family Communication: none Disposition Plan: Per Rounding Team Consults called: none;  Admission status: Inpatient   PLEASE NOTE THAT DRAGON DICTATION SOFTWARE WAS USED IN THE CONSTRUCTION OF THIS NOTE.   Delta DO [facility]  From [place]   05/23/2021, 1:35 AM

## 2021-05-24 DIAGNOSIS — F10930 Alcohol use, unspecified with withdrawal, uncomplicated: Secondary | ICD-10-CM | POA: Diagnosis not present

## 2021-05-24 DIAGNOSIS — R7401 Elevation of levels of liver transaminase levels: Secondary | ICD-10-CM | POA: Diagnosis not present

## 2021-05-24 DIAGNOSIS — R1013 Epigastric pain: Secondary | ICD-10-CM | POA: Diagnosis not present

## 2021-05-24 DIAGNOSIS — F1010 Alcohol abuse, uncomplicated: Secondary | ICD-10-CM | POA: Diagnosis not present

## 2021-05-24 LAB — COMPREHENSIVE METABOLIC PANEL
ALT: 25 U/L (ref 0–44)
AST: 159 U/L — ABNORMAL HIGH (ref 15–41)
Albumin: 2.9 g/dL — ABNORMAL LOW (ref 3.5–5.0)
Alkaline Phosphatase: 70 U/L (ref 38–126)
Anion gap: 8 (ref 5–15)
BUN: <5 mg/dL — ABNORMAL LOW (ref 6–20)
CO2: 22 mmol/L (ref 22–32)
Calcium: 7.4 mg/dL — ABNORMAL LOW (ref 8.9–10.3)
Chloride: 106 mmol/L (ref 98–111)
Creatinine, Ser: 0.6 mg/dL (ref 0.44–1.00)
GFR, Estimated: >60 mL/min (ref >60)
Glucose, Bld: 74 mg/dL (ref 70–99)
Potassium: 3.5 mmol/L (ref 3.5–5.1)
Sodium: 136 mmol/L (ref 135–145)
Total Bilirubin: 2.5 mg/dL — ABNORMAL HIGH (ref 0.3–1.2)
Total Protein: 6.2 g/dL — ABNORMAL LOW (ref 6.5–8.1)

## 2021-05-24 LAB — PHOSPHORUS: Phosphorus: 1.8 mg/dL — ABNORMAL LOW (ref 2.5–4.6)

## 2021-05-24 LAB — CBC
HCT: 27.1 % — ABNORMAL LOW (ref 36.0–46.0)
Hemoglobin: 8.6 g/dL — ABNORMAL LOW (ref 12.0–15.0)
MCH: 30 pg (ref 26.0–34.0)
MCHC: 31.7 g/dL (ref 30.0–36.0)
MCV: 94.4 fL (ref 80.0–100.0)
Platelets: 103 10*3/uL — ABNORMAL LOW (ref 150–400)
RBC: 2.87 MIL/uL — ABNORMAL LOW (ref 3.87–5.11)
RDW: 23.1 % — ABNORMAL HIGH (ref 11.5–15.5)
WBC: 4.5 10*3/uL (ref 4.0–10.5)
nRBC: 0 % (ref 0.0–0.2)

## 2021-05-24 LAB — MAGNESIUM: Magnesium: 1.5 mg/dL — ABNORMAL LOW (ref 1.7–2.4)

## 2021-05-24 MED ORDER — SUCRALFATE 1 GM/10ML PO SUSP
1.0000 g | Freq: Three times a day (TID) | ORAL | Status: DC
  Administered 2021-05-24 – 2021-06-02 (×34): 1 g via ORAL
  Filled 2021-05-24 (×31): qty 10

## 2021-05-24 MED ORDER — MAGNESIUM SULFATE 4 GM/100ML IV SOLN
4.0000 g | Freq: Once | INTRAVENOUS | Status: AC
  Administered 2021-05-24: 4 g via INTRAVENOUS
  Filled 2021-05-24: qty 100

## 2021-05-24 MED ORDER — POTASSIUM PHOSPHATES 15 MMOLE/5ML IV SOLN
30.0000 mmol | Freq: Once | INTRAVENOUS | Status: AC
  Administered 2021-05-24: 30 mmol via INTRAVENOUS
  Filled 2021-05-24: qty 10

## 2021-05-24 NOTE — Progress Notes (Signed)
PROGRESS NOTE        PATIENT DETAILS Name: Nicole Hughes Age: 51 y.o. Sex: female Date of Birth: Apr 20, 1970 Admit Date: 05/22/2021 Admitting Physician Angie Fava, DO PCP:Pcp, No  Brief Summary: Patient is a 51 y.o.  female with history of EtOH use, GERD, HTN-who presented to the ED with epigastric pain-she was thought to have pancreatitis-alcohol withdrawal-and subsequently admitted to the hospitalist service.   Significant events: 5/17>> admit to [facility]-epigastric pain-EtOH withdrawal  Significant studies: 12/15/20>> RUQ ultrasound: No gallstones 05/22/21>> CXR: No PNA 05/22/21>> CT abdomen/pelvis: No acute abnormality.  No cholelithiasis.  Pancreas unremarkable. 05/22/21>> lipase: 141  Significant microbiology data: 5/17>> COVID/influenza PCR: Negative  Procedures: None  Consults: None   Subjective: Looks a lot better than yesterday.  Tremulous.  Continues to have some epigastric pain.  Objective: Vitals: Blood pressure 114/77, pulse (!) 102, temperature 98.4 F (36.9 C), temperature source Axillary, resp. rate 18, height 5\' 3"  (1.6 m), weight 62.2 kg, SpO2 98 %.   Exam: Gen Exam:Alert awake-not in any distress HEENT:atraumatic, normocephalic Chest: B/L clear to auscultation anteriorly CVS:S1S2 regular Abdomen: Soft-minimal epigastric tenderness without any peritoneal signs. Extremities:no edema Neurology: Non focal Skin: no rash   Pertinent Labs/Radiology:    Latest Ref Rng & Units 05/24/2021    2:13 AM 05/23/2021    2:37 AM 05/22/2021    4:33 PM  CBC  WBC 4.0 - 10.5 K/uL 4.5   4.5   6.0    Hemoglobin 12.0 - 15.0 g/dL 8.6   8.7   9.5    Hematocrit 36.0 - 46.0 % 27.1   27.3   29.7    Platelets 150 - 400 K/uL 103   99   130      Lab Results  Component Value Date   NA 136 05/24/2021   K 3.5 05/24/2021   CL 106 05/24/2021   CO2 22 05/24/2021       Assessment/Plan: Epigastric pain-likely due to acute alcoholic  pancreatitis: Although no CT evidence of pancreatitis-has epigastric pain and elevated lipase levels-some slight improvement on exam-continue IVF-clear liquids.  In case this is alcoholic gastritis-she is on PPI-we will add Carafate today.   Alcoholic hepatitis: Mild-supportive care.  EtOH withdrawal: Last drink on 5/16-tremulous but awake and alert.  Continue Ativan per CIWA protocol.  Cardiac better.  HTN: BP relatively stable-continue metoprolol and amlodipine.  Hypomagnesemia/hypophosphatemia: Due to EtOH use-replete and recheck.  Normocytic anemia: Chronic issue-watch closely.  Transfuse if Hb <7.  Probably due to bone marrow suppression from alcohol use.  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  Underweight: Estimated body mass index is 24.29 kg/m as calculated from the following:   Height as of this encounter: 5\' 3"  (1.6 m).   Weight as of this encounter: 62.2 kg.   Code status:   Code Status: Full Code   DVT Prophylaxis: SCDs Start: 05/23/21 0017   Family Communication: None at bedside   Disposition Plan: Status is: Inpatient Remains inpatient appropriate because: Epigastric pain-ongoing withdrawal symptoms-not stable for discharge.   Planned Discharge Destination:Home   Diet: Diet Order             Diet clear liquid Room service appropriate? Yes; Fluid consistency: Thin  Diet effective now  Antimicrobial agents: Anti-infectives (From admission, onward)    None        MEDICATIONS: Scheduled Meds:  amLODipine  10 mg Oral Daily   metoprolol tartrate  50 mg Oral BID   pantoprazole (PROTONIX) IV  40 mg Intravenous Q12H   Continuous Infusions:  sodium chloride 150 mL/hr at 05/23/21 1748   potassium PHOSPHATE IVPB (in mmol) 30 mmol (05/24/21 1040)   PRN Meds:.acetaminophen **OR** acetaminophen, albuterol, alum & mag hydroxide-simeth, LORazepam **OR** LORazepam, metoprolol  tartrate, morphine injection, naLOXone (NARCAN)  injection, ondansetron (ZOFRAN) IV   I have personally reviewed following labs and imaging studies  LABORATORY DATA: CBC: Recent Labs  Lab 05/22/21 1633 05/23/21 0237 05/24/21 0213  WBC 6.0 4.5 4.5  NEUTROABS  --  2.7  --   HGB 9.5* 8.7* 8.6*  HCT 29.7* 27.3* 27.1*  MCV 94.6 94.5 94.4  PLT 130* 99* 103*     Basic Metabolic Panel: Recent Labs  Lab 05/22/21 1633 05/23/21 0237 05/24/21 0213  NA 136 137 136  K 4.9 4.3 3.5  CL 107 107 106  CO2 20* 18* 22  GLUCOSE 83 68* 74  BUN 12 11 <5*  CREATININE 0.81 0.72 0.60  CALCIUM 8.8* 7.8* 7.4*  MG  --  1.2* 1.5*  PHOS  --  2.6 1.8*     GFR: Estimated Creatinine Clearance: 70.4 mL/min (by C-G formula based on SCr of 0.6 mg/dL).  Liver Function Tests: Recent Labs  Lab 05/22/21 1633 05/23/21 0237 05/24/21 0213  AST 264* 198* 159*  ALT 33 29 25  ALKPHOS 90 81 70  BILITOT 1.6* 2.4* 2.5*  PROT 7.4 6.5 6.2*  ALBUMIN 3.5 3.0* 2.9*    Recent Labs  Lab 05/22/21 1633 05/23/21 0237  LIPASE 141* 111*    Recent Labs  Lab 05/23/21 1418  AMMONIA 61*    Coagulation Profile: Recent Labs  Lab 05/23/21 0237  INR 1.2     Cardiac Enzymes: No results for input(s): CKTOTAL, CKMB, CKMBINDEX, TROPONINI in the last 168 hours.  BNP (last 3 results) No results for input(s): PROBNP in the last 8760 hours.  Lipid Profile: No results for input(s): CHOL, HDL, LDLCALC, TRIG, CHOLHDL, LDLDIRECT in the last 72 hours.  Thyroid Function Tests: No results for input(s): TSH, T4TOTAL, FREET4, T3FREE, THYROIDAB in the last 72 hours.  Anemia Panel: Recent Labs    05/22/21 1633 05/23/21 0237  VITAMINB12 786  --   FOLATE  --  4.8*  FERRITIN  --  82  TIBC  --  316  IRON  --  81  RETICCTPCT  --  3.4*     Urine analysis:    Component Value Date/Time   COLORURINE YELLOW 05/22/2021 2102   APPEARANCEUR CLEAR 05/22/2021 2102   LABSPEC 1.038 (H) 05/22/2021 2102   PHURINE  6.0 05/22/2021 2102   GLUCOSEU NEGATIVE 05/22/2021 2102   HGBUR SMALL (A) 05/22/2021 2102   BILIRUBINUR NEGATIVE 05/22/2021 2102   BILIRUBINUR trace 12/20/2012 1553   KETONESUR NEGATIVE 05/22/2021 2102   PROTEINUR NEGATIVE 05/22/2021 2102   UROBILINOGEN 0.2 06/22/2014 1147   NITRITE NEGATIVE 05/22/2021 2102   LEUKOCYTESUR TRACE (A) 05/22/2021 2102    Sepsis Labs: Lactic Acid, Venous    Component Value Date/Time   LATICACIDVEN 1.0 12/15/2020 0140    MICROBIOLOGY: Recent Results (from the past 240 hour(s))  Resp Panel by RT-PCR (Flu A&B, Covid) Nasopharyngeal Swab     Status: None   Collection Time: 05/22/21  4:20 PM   Specimen: Nasopharyngeal  Swab; Nasopharyngeal(NP) swabs in vial transport medium  Result Value Ref Range Status   SARS Coronavirus 2 by RT PCR NEGATIVE NEGATIVE Final    Comment: (NOTE) SARS-CoV-2 target nucleic acids are NOT DETECTED.  The SARS-CoV-2 RNA is generally detectable in upper respiratory specimens during the acute phase of infection. The lowest concentration of SARS-CoV-2 viral copies this assay can detect is 138 copies/mL. A negative result does not preclude SARS-Cov-2 infection and should not be used as the sole basis for treatment or other patient management decisions. A negative result may occur with  improper specimen collection/handling, submission of specimen other than nasopharyngeal swab, presence of viral mutation(s) within the areas targeted by this assay, and inadequate number of viral copies(<138 copies/mL). A negative result must be combined with clinical observations, patient history, and epidemiological information. The expected result is Negative.  Fact Sheet for Patients:  BloggerCourse.com  Fact Sheet for Healthcare Providers:  SeriousBroker.it  This test is no t yet approved or cleared by the [place] FDA and  has been authorized for detection and/or diagnosis of  SARS-CoV-2 by FDA under an Emergency Use Authorization (EUA). This EUA will remain  in effect (meaning this test can be used) for the duration of the COVID-19 declaration under Section 564(b)(1) of the Act, 21 U.S.C.section 360bbb-3(b)(1), unless the authorization is terminated  or revoked sooner.       Influenza A by PCR NEGATIVE NEGATIVE Final   Influenza B by PCR NEGATIVE NEGATIVE Final    Comment: (NOTE) The Xpert Xpress SARS-CoV-2/FLU/RSV plus assay is intended as an aid in the diagnosis of influenza from Nasopharyngeal swab specimens and should not be used as a sole basis for treatment. Nasal washings and aspirates are unacceptable for Xpert Xpress SARS-CoV-2/FLU/RSV testing.  Fact Sheet for Patients: BloggerCourse.com  Fact Sheet for Healthcare Providers: SeriousBroker.it  This test is not yet approved or cleared by the [place] FDA and has been authorized for detection and/or diagnosis of SARS-CoV-2 by FDA under an Emergency Use Authorization (EUA). This EUA will remain in effect (meaning this test can be used) for the duration of the COVID-19 declaration under Section 564(b)(1) of the Act, 21 U.S.C. section 360bbb-3(b)(1), unless the authorization is terminated or revoked.  Performed at [facility], [address] [place], [place] [address]   MRSA Next Gen by PCR, Nasal     Status: None   Collection Time: 05/23/21  3:59 PM   Specimen: Nasal Mucosa; Nasal Swab  Result Value Ref Range Status   MRSA by PCR Next Gen NOT DETECTED NOT DETECTED Final    Comment: (NOTE) The GeneXpert MRSA Assay (FDA approved for NASAL specimens only), is one component of a comprehensive MRSA colonization surveillance program. It is not intended to diagnose MRSA infection nor to guide or monitor treatment for MRSA infections. Test performance is not FDA approved in patients less than 60 years old. Performed at [facility], [address], [place], [place] [address]     RADIOLOGY STUDIES/RESULTS: CT Abdomen Pelvis W Contrast  Result Date: 05/22/2021 CLINICAL DATA:  Acute abdominal pain with elevated lipase, initial encounter EXAM: CT ABDOMEN AND PELVIS WITH CONTRAST TECHNIQUE: Multidetector CT imaging of the abdomen and pelvis was performed using the standard protocol following bolus administration of intravenous contrast. RADIATION DOSE REDUCTION: This exam was performed according to the departmental dose-optimization program which includes automated exposure control, adjustment of the mA and/or kV according to patient size and/or use of iterative reconstruction  technique. CONTRAST:  75 mL Omnipaque 350. COMPARISON:  07/08/2020 FINDINGS: Lower chest: Mild dependent atelectatic changes are noted. No focal infiltrate is seen. Hepatobiliary: Fatty infiltration of the liver is noted. The gallbladder is well distended without evidence of cholelithiasis. No biliary ductal dilatation is seen. Pancreas: Unremarkable. No pancreatic ductal dilatation or surrounding inflammatory changes. Spleen: Normal in size without focal abnormality. Adrenals/Urinary Tract: Adrenal glands are within normal limits. Kidneys show normal enhancement pattern bilaterally. No renal calculi or obstructive changes are seen. Normal excretion is noted on delayed images. Bladder is partially distended. Stomach/Bowel: The appendix is within normal limits. No obstructive or inflammatory changes of the colon are seen. Stomach is within normal limits. Small bowel shows no focal abnormality. Vascular/Lymphatic: Aortic atherosclerosis. No enlarged abdominal or pelvic lymph nodes. Reproductive: Uterus and bilateral adnexa are unremarkable. Other: No abdominal wall hernia or abnormality. No abdominopelvic ascites. Musculoskeletal: No acute or significant osseous findings. IMPRESSION: Fatty liver. No acute abnormality is noted. Specifically the pancreas is within  normal limits. Electronically Signed   By: Alcide CleverMark  Lukens M.D.   On: 05/22/2021 19:36   DG Chest Port 1 View  Result Date: 05/22/2021 CLINICAL DATA:  cp EXAM: PORTABLE CHEST 1 VIEW COMPARISON:  December 16, 2020 FINDINGS: The cardiomediastinal silhouette is unchanged in contour.Unchanged elevation of the RIGHT hemidiaphragm. No pleural effusion. No pneumothorax. No acute pleuroparenchymal abnormality. Visualized abdomen is unremarkable. IMPRESSION: No acute cardiopulmonary abnormality. Electronically Signed   By: Meda KlinefelterStephanie  Peacock M.D.   On: 05/22/2021 18:54     LOS: 1 day   Jeoffrey MassedShanker Brayln Duque, MD  [facility]    To contact the attending provider between 7A-7P or the covering provider during after hours 7P-7A, please log into the web site [email or web address] and access using universal [place] password for that web site. If you do not have the password, please call the hospital operator.  05/24/2021, 2:32 PM

## 2021-05-24 NOTE — Progress Notes (Signed)
Pt resting with eyes closed. RR even and unlabored on room air.   05/24/21 0600  CIWA-Ar  Nausea and Vomiting 0  Tactile Disturbances 0  Tremor 0  Auditory Disturbances 0  Paroxysmal Sweats 0  Visual Disturbances 0  Anxiety 0  Headache, Fullness in Head 0  Agitation 0  Orientation and Clouding of Sensorium 0  CIWA-Ar Total 0

## 2021-05-25 DIAGNOSIS — R1013 Epigastric pain: Secondary | ICD-10-CM | POA: Diagnosis not present

## 2021-05-25 LAB — COMPREHENSIVE METABOLIC PANEL
ALT: 28 U/L (ref 0–44)
AST: 154 U/L — ABNORMAL HIGH (ref 15–41)
Albumin: 2.9 g/dL — ABNORMAL LOW (ref 3.5–5.0)
Alkaline Phosphatase: 72 U/L (ref 38–126)
Anion gap: 9 (ref 5–15)
BUN: <5 mg/dL — ABNORMAL LOW (ref 6–20)
CO2: 21 mmol/L — ABNORMAL LOW (ref 22–32)
Calcium: 7.5 mg/dL — ABNORMAL LOW (ref 8.9–10.3)
Chloride: 107 mmol/L (ref 98–111)
Creatinine, Ser: 0.63 mg/dL (ref 0.44–1.00)
GFR, Estimated: >60 mL/min (ref >60)
Glucose, Bld: 84 mg/dL (ref 70–99)
Potassium: 3.7 mmol/L (ref 3.5–5.1)
Sodium: 137 mmol/L (ref 135–145)
Total Bilirubin: 1.8 mg/dL — ABNORMAL HIGH (ref 0.3–1.2)
Total Protein: 6.1 g/dL — ABNORMAL LOW (ref 6.5–8.1)

## 2021-05-25 LAB — CBC
HCT: 24.9 % — ABNORMAL LOW (ref 36.0–46.0)
Hemoglobin: 8.3 g/dL — ABNORMAL LOW (ref 12.0–15.0)
MCH: 31.7 pg (ref 26.0–34.0)
MCHC: 33.3 g/dL (ref 30.0–36.0)
MCV: 95 fL (ref 80.0–100.0)
Platelets: 113 10*3/uL — ABNORMAL LOW (ref 150–400)
RBC: 2.62 MIL/uL — ABNORMAL LOW (ref 3.87–5.11)
RDW: 22.7 % — ABNORMAL HIGH (ref 11.5–15.5)
WBC: 3.5 10*3/uL — ABNORMAL LOW (ref 4.0–10.5)
nRBC: 0 % (ref 0.0–0.2)

## 2021-05-25 LAB — MAGNESIUM: Magnesium: 1.7 mg/dL (ref 1.7–2.4)

## 2021-05-25 LAB — PHOSPHORUS: Phosphorus: 1.9 mg/dL — ABNORMAL LOW (ref 2.5–4.6)

## 2021-05-25 MED ORDER — SODIUM PHOSPHATES 45 MMOLE/15ML IV SOLN
30.0000 mmol | Freq: Once | INTRAVENOUS | Status: AC
  Administered 2021-05-25: 30 mmol via INTRAVENOUS
  Filled 2021-05-25: qty 10

## 2021-05-25 MED ORDER — SODIUM CHLORIDE 0.9 % IV SOLN: INTRAVENOUS | Status: AC

## 2021-05-25 MED ORDER — POTASSIUM CHLORIDE 20 MEQ PO PACK
20.0000 meq | PACK | Freq: Once | ORAL | Status: AC
  Administered 2021-05-25: 20 meq via ORAL
  Filled 2021-05-25: qty 1

## 2021-05-25 MED ORDER — K PHOS MONO-SOD PHOS DI & MONO 155-852-130 MG PO TABS
500.0000 mg | ORAL_TABLET | Freq: Once | ORAL | Status: AC
Start: 2021-05-25 — End: 2021-05-25
  Administered 2021-05-25: 500 mg via ORAL
  Filled 2021-05-25: qty 2

## 2021-05-25 NOTE — Progress Notes (Signed)
3mg  Ativan admin   05/24/21 2257  CIWA-Ar  Nausea and Vomiting 1  Tactile Disturbances 3  Tremor 3  Auditory Disturbances 0  Paroxysmal Sweats 1  Visual Disturbances 0  Anxiety 4  Headache, Fullness in Head 3  Agitation 3  Orientation and Clouding of Sensorium 1  CIWA-Ar Total 19

## 2021-05-25 NOTE — Progress Notes (Addendum)
Pt observed resting with eyes closed. RR even and unlabored on room air. Pt able to arouse with ease. On-call MD notified of steady increase in CIWA scores. No new orders given.     05/25/21 0237  CIWA-Ar  Nausea and Vomiting 0  Tactile Disturbances 0  Tremor 1  Auditory Disturbances 0  Paroxysmal Sweats 1  Visual Disturbances 0  Anxiety 0  Headache, Fullness in Head 0  Agitation 0  Orientation and Clouding of Sensorium 1  CIWA-Ar Total 3

## 2021-05-25 NOTE — Progress Notes (Signed)
PROGRESS NOTE        PATIENT DETAILS Name: Nicole Hughes Age: 51 y.o. Sex: female Date of Birth: [birth year] Admit Date: 05/22/2021 Admitting Physician Angie Fava, DO PCP:Pcp, No  Brief Summary: Patient is a 51 y.o.  female with history of EtOH use, GERD, HTN-who presented to the ED with epigastric pain-she was thought to have pancreatitis-alcohol withdrawal-and subsequently admitted to the hospitalist service.   Significant events: 5/17>> admit to [facility]-epigastric pain-EtOH withdrawal  Significant studies: 5/17>> CXR: No PNA 5/17>> CT abdomen/pelvis: No acute abnormality. 5/17>> lipase: 141  Significant microbiology data: 5/17>> COVID/influenza PCR: Negative  Procedures: None  Consults: None   Subjective:   Patient in bed, mildly confused but comfortable, still has some ongoing abdominal pain but overall getting better, no focal deficits, no chest pain no shortness of breath   Objective: Vitals: Blood pressure 98/68, pulse 98, temperature 98.7 F (37.1 C), temperature source Oral, resp. rate 10, height 5\' 3"  (1.6 m), weight 62.2 kg, SpO2 98 %.   Exam:  Awake Alert x2, No new F.N deficits, Normal affect [place].AT,PERRAL Supple Neck, No JVD,   Symmetrical Chest wall movement, Good air movement bilaterally, CTAB RRR,No Gallops, Rubs or new Murmurs,  +ve B.Sounds, Abd Soft, No tenderness,   No Cyanosis, Clubbing or edema    Assessment/Plan:  Epigastric pain-likely due to acute alcoholic pancreatitis: Diagnosed clinically with epigastric pain, alcohol abuse and elevated lipase, CT scan of the abdomen pelvis was reassuring, being treated with conservative management with gradual but definite improvement, continue hydration, bowel rest and supportive care.  Strictly counseled to quit alcohol.  Alcoholic hepatitis: Mild-supportive care.  EtOH withdrawal: In early DTs, improving with CIWA protocol.  Strictly counseled to quit  alcohol.  HTN: BP relatively stable-continue metoprolol and amlodipine.  Hypomagnesemia: Replete and recheck  Normocytic anemia: Chronic issue-watch closely.  Transfuse if Hb <7.  Probably due to bone marrow suppression from alcohol use.  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  Underweight: Estimated body mass index is 24.29 kg/m as calculated from the following:   Height as of this encounter: 5\' 3"  (1.6 m).   Weight as of this encounter: 62.2 kg.   Code status:   Code Status: Full Code   DVT Prophylaxis: SCDs Start: 05/23/21 0017   Family Communication: None at bedside   Disposition Plan: Status is: Inpatient Remains inpatient appropriate because: Epigastric pain-ongoing withdrawal symptoms-not stable for discharge.   Planned Discharge Destination:Home   Diet: Diet Order             Diet clear liquid Room service appropriate? Yes; Fluid consistency: Thin  Diet effective now                     Antimicrobial agents: Anti-infectives (From admission, onward)    None        MEDICATIONS: Scheduled Meds:  amLODipine  10 mg Oral Daily   metoprolol tartrate  50 mg Oral BID   pantoprazole (PROTONIX) IV  40 mg Intravenous Q12H   phosphorus  500 mg Oral Once   potassium chloride  20 mEq Oral Once   sucralfate  1 g Oral TID WC & HS   Continuous Infusions:  sodium chloride     sodium phosphate  Dextrose 5% IVPB 30 mmol (05/25/21 1001)   PRN Meds:.acetaminophen **OR** acetaminophen, albuterol, alum &  mag hydroxide-simeth, LORazepam **OR** LORazepam, metoprolol tartrate, morphine injection, naLOXone (NARCAN)  injection, ondansetron (ZOFRAN) IV   I have personally reviewed following labs and imaging studies  LABORATORY DATA:  Recent Labs  Lab 05/22/21 1633 05/23/21 0237 05/24/21 0213 05/25/21 0053  WBC 6.0 4.5 4.5 3.5*  HGB 9.5* 8.7* 8.6* 8.3*  HCT 29.7* 27.3* 27.1* 24.9*  PLT 130* 99* 103* 113*   MCV 94.6 94.5 94.4 95.0  MCH 30.3 30.1 30.0 31.7  MCHC 32.0 31.9 31.7 33.3  RDW Not Measured 24.4* 23.1* 22.7*  LYMPHSABS  --  1.1  --   --   MONOABS  --  0.5  --   --   EOSABS  --  0.1  --   --   BASOSABS  --  0.1  --   --     Recent Labs  Lab 05/22/21 1633 05/23/21 0237 05/23/21 1418 05/24/21 0213 05/25/21 0053  NA 136 137  --  136 137  K 4.9 4.3  --  3.5 3.7  CL 107 107  --  106 107  CO2 20* 18*  --  22 21*  GLUCOSE 83 68*  --  74 84  BUN 12 11  --  <5* <5*  CREATININE 0.81 0.72  --  0.60 0.63  CALCIUM 8.8* 7.8*  --  7.4* 7.5*  AST 264* 198*  --  159* 154*  ALT 33 29  --  25 28  ALKPHOS 90 81  --  70 72  BILITOT 1.6* 2.4*  --  2.5* 1.8*  ALBUMIN 3.5 3.0*  --  2.9* 2.9*  MG  --  1.2*  --  1.5* 1.7  PHOS  --  2.6  --  1.8* 1.9*  INR  --  1.2  --   --   --   AMMONIA  --   --  61*  --   --     RADIOLOGY STUDIES/RESULTS: No results found.   LOS: 2 days   Signature  Susa Raring M.D on 05/25/2021 at 11:20 AM   -  To page go to [email or web address]

## 2021-05-25 NOTE — Progress Notes (Signed)
3mg  Ativan Admin   05/25/21 0100  CIWA-Ar  BP 130/85  Pulse Rate 89  Nausea and Vomiting 3  Tactile Disturbances 3  Tremor 2  Auditory Disturbances 0  Paroxysmal Sweats 1  Visual Disturbances 0  Anxiety 3  Headache, Fullness in Head 3  Agitation 4  Orientation and Clouding of Sensorium 1  CIWA-Ar Total 20

## 2021-05-25 NOTE — Progress Notes (Signed)
Received a call from bedside RN regarding the patient having suicidal ideation.  The patient is willing to stay in the hospital and receive help.  Suicidal watch and one-to-one sitter in place.  Psychiatry consulted for further management.

## 2021-05-25 NOTE — Progress Notes (Signed)
IV access lost. Failed attempts to gain access. IV team consulted.
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
## 2021-05-25 NOTE — Progress Notes (Signed)
3mg  Ativan given   05/25/21 0400  CIWA-Ar  Pulse Rate 90  Nausea and Vomiting 2  Tactile Disturbances 0  Tremor 3  Auditory Disturbances 0  Paroxysmal Sweats 1  Visual Disturbances 0  Anxiety 3  Headache, Fullness in Head 3  Agitation 3  Orientation and Clouding of Sensorium 1  CIWA-Ar Total 16

## 2021-05-26 ENCOUNTER — Inpatient Hospital Stay (HOSPITAL_COMMUNITY): Payer: BC Managed Care – PPO

## 2021-05-26 DIAGNOSIS — R1013 Epigastric pain: Secondary | ICD-10-CM | POA: Diagnosis not present

## 2021-05-26 LAB — CBC WITH DIFFERENTIAL/PLATELET
Abs Immature Granulocytes: 0 10*3/uL (ref 0.00–0.07)
Basophils Absolute: 0 10*3/uL (ref 0.0–0.1)
Basophils Relative: 1 %
Eosinophils Absolute: 0.1 10*3/uL (ref 0.0–0.5)
Eosinophils Relative: 2 %
HCT: 24.4 % — ABNORMAL LOW (ref 36.0–46.0)
Hemoglobin: 7.8 g/dL — ABNORMAL LOW (ref 12.0–15.0)
Immature Granulocytes: 0 %
Lymphocytes Relative: 29 %
Lymphs Abs: 0.8 10*3/uL (ref 0.7–4.0)
MCH: 30.7 pg (ref 26.0–34.0)
MCHC: 32 g/dL (ref 30.0–36.0)
MCV: 96.1 fL (ref 80.0–100.0)
Monocytes Absolute: 0.3 10*3/uL (ref 0.1–1.0)
Monocytes Relative: 12 %
Neutro Abs: 1.6 10*3/uL — ABNORMAL LOW (ref 1.7–7.7)
Neutrophils Relative %: 56 %
Platelets: 117 10*3/uL — ABNORMAL LOW (ref 150–400)
RBC: 2.54 MIL/uL — ABNORMAL LOW (ref 3.87–5.11)
RDW: 22.5 % — ABNORMAL HIGH (ref 11.5–15.5)
WBC: 2.8 10*3/uL — ABNORMAL LOW (ref 4.0–10.5)
nRBC: 0 % (ref 0.0–0.2)

## 2021-05-26 LAB — COMPREHENSIVE METABOLIC PANEL
ALT: 28 U/L (ref 0–44)
AST: 131 U/L — ABNORMAL HIGH (ref 15–41)
Albumin: 2.7 g/dL — ABNORMAL LOW (ref 3.5–5.0)
Alkaline Phosphatase: 69 U/L (ref 38–126)
Anion gap: 6 (ref 5–15)
BUN: <5 mg/dL — ABNORMAL LOW (ref 6–20)
CO2: 22 mmol/L (ref 22–32)
Calcium: 7.4 mg/dL — ABNORMAL LOW (ref 8.9–10.3)
Chloride: 112 mmol/L — ABNORMAL HIGH (ref 98–111)
Creatinine, Ser: 0.54 mg/dL (ref 0.44–1.00)
GFR, Estimated: >60 mL/min (ref >60)
Glucose, Bld: 110 mg/dL — ABNORMAL HIGH (ref 70–99)
Potassium: 3.3 mmol/L — ABNORMAL LOW (ref 3.5–5.1)
Sodium: 140 mmol/L (ref 135–145)
Total Bilirubin: 1.8 mg/dL — ABNORMAL HIGH (ref 0.3–1.2)
Total Protein: 5.9 g/dL — ABNORMAL LOW (ref 6.5–8.1)

## 2021-05-26 LAB — PHOSPHORUS: Phosphorus: 2.4 mg/dL — ABNORMAL LOW (ref 2.5–4.6)

## 2021-05-26 LAB — MAGNESIUM: Magnesium: 1.2 mg/dL — ABNORMAL LOW (ref 1.7–2.4)

## 2021-05-26 LAB — LIPASE, BLOOD: Lipase: 107 U/L — ABNORMAL HIGH (ref 11–51)

## 2021-05-26 LAB — BRAIN NATRIURETIC PEPTIDE: B Natriuretic Peptide: 95.6 pg/mL (ref 0.0–100.0)

## 2021-05-26 MED ORDER — MIRTAZAPINE 15 MG PO TABS
15.0000 mg | ORAL_TABLET | Freq: Every day | ORAL | Status: DC
Start: 2021-05-26 — End: 2021-06-02
  Administered 2021-05-26 – 2021-06-01 (×7): 15 mg via ORAL
  Filled 2021-05-26 (×7): qty 1

## 2021-05-26 MED ORDER — LORAZEPAM 1 MG PO TABS
1.0000 mg | ORAL_TABLET | ORAL | Status: DC | PRN
  Administered 2021-05-26 (×3): 2 mg via ORAL
  Administered 2021-05-26: 1 mg via ORAL
  Administered 2021-05-27: 4 mg via ORAL
  Filled 2021-05-26: qty 4
  Filled 2021-05-26 (×3): qty 2
  Filled 2021-05-26: qty 1

## 2021-05-26 MED ORDER — POTASSIUM PHOSPHATES 15 MMOLE/5ML IV SOLN
30.0000 mmol | Freq: Once | INTRAVENOUS | Status: AC
  Administered 2021-05-26: 30 mmol via INTRAVENOUS
  Filled 2021-05-26: qty 10

## 2021-05-26 MED ORDER — MAGNESIUM SULFATE 4 GM/100ML IV SOLN
4.0000 g | Freq: Once | INTRAVENOUS | Status: AC
  Administered 2021-05-26: 4 g via INTRAVENOUS
  Filled 2021-05-26: qty 100

## 2021-05-26 MED ORDER — SODIUM PHOSPHATES 45 MMOLE/15ML IV SOLN: 30.0000 mmol | Freq: Once | INTRAVENOUS | Status: DC

## 2021-05-26 MED ORDER — POTASSIUM CHLORIDE CRYS ER 20 MEQ PO TBCR: 40.0000 meq | EXTENDED_RELEASE_TABLET | Freq: Two times a day (BID) | ORAL | Status: DC

## 2021-05-26 MED ORDER — LACTATED RINGERS IV SOLN
INTRAVENOUS | Status: DC
  Filled 2021-05-26 (×2): qty 1000

## 2021-05-26 MED ORDER — LORAZEPAM 2 MG/ML IJ SOLN
1.0000 mg | INTRAMUSCULAR | Status: DC | PRN
  Administered 2021-05-26 – 2021-05-27 (×2): 2 mg via INTRAVENOUS
  Filled 2021-05-26 (×2): qty 1

## 2021-05-26 MED ORDER — LACTATED RINGERS IV SOLN: INTRAVENOUS | Status: DC

## 2021-05-26 NOTE — Consult Note (Signed)
[facility] Face-to-Face Psychiatry Consult   Reason for Consult:  suicide ideation Referring Physician:  Delma Post Patient Identification: Nicole Hughes MRN:  [id number] Principal Diagnosis: Epigastric pain Diagnosis:  Principal Problem:   Epigastric pain Active Problems:   Essential hypertension   Chronic alcohol abuse   Chronic anemia   Alcohol withdrawal (HCC)   Transaminitis   Total Time spent with patient: 30 minutes  Subjective:   Nicole Hughes is a 51 y.o. female with medical history significant for chronic alcohol abuse, GERD, essential hypertension, chronic anemia associated baseline hemoglobin 8-11, who is admitted to [facility] on 05/22/2021 with acute epigastric pain after presenting from home to [facility] ED complaining of such.   HPI:   PTA psychiatric medications:  On assessment today, the patient reports that her mood is down depressed sad, worthless, hopeless, helpless.  She reports anhedonia.  She reports has been going on for many years. Patient reports multiple stressors including financial stress, conflict with family, and recent loss, as contributing factors to worsening depression and suicidal thoughts. Patient reports that sleep is poor with difficulty initiating and maintaining sleep.  Reports low energy.  Reports low appetite with weight loss of 35 pounds within the last 2 months.  Reports poor concentration.  She reports passive suicidal thoughts, without intent or plan.  Denies HI. Patient reports that anxiety is elevated, excessive, generalized.  Reports panic attacks, once per week, out of the blue. Denies any psychotic symptoms. Denies any symptoms of mania. Patient reports a history of trauma, including finding her father dead, about 2 years ago.  Reports nightmares and flashbacks since this time.  Reports negative alterations in cognition and mood, and avoidance symptoms, due to this traumatic event.  She was his caregiver.   Past Psychiatric History:   Diagnosis: MDD Denies any previous psychiatric hospitalizations Denies any previous suicide attempts Current psychiatric medications: None Psychiatric medication history: Patient reports taking 8 psychiatric medications, around 2012, but does not remember what any of these medications were.  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   GERD (gastroesophageal reflux disease)    Hay fever    "fall and spring" (02/17/2017)   Headache    "2-3 times/.wik" (02/17/2017)   History of chicken pox    Hypertension    Migraine    "monthly" (02/17/2017)   Pelvic pain    Pulmonary embolism (HCC) 08/1998   S/P childbirth    Past Surgical History:  Procedure Laterality Date   CESAREAN SECTION  1994   ECTOPIC PREGNANCY SURGERY  2010   LAPAROSCOPY N/A 09/24/2012   Procedure: OPERATIVE LAPROSCOPY WITH LYSIS OF ADHESIONS;  Surgeon: Sherron Monday, MD;  Location: [facility];  Service: Gynecology;  Laterality: N/A;   TONSILLECTOMY  1984   TUBAL LIGATION  2010   Family History:  Family History  Problem Relation Age of Onset   Cancer - Lung Mother    Heart failure Father  Hypertension Father    Family Psychiatric  History:  Mother attempted suicide  Social History:  Social History   Substance and Sexual Activity  Alcohol Use Not Currently   Comment: a 5th of liquor per day     Social History   Substance and Sexual Activity  Drug Use Yes   Frequency: 1.0 times per week   Types: Marijuana   Comment: Gives you chest pain - trying to quit     Social History   Socioeconomic History   Marital status: Married    Spouse name: Not on file   Number of children: Not on file   Years of education: Not on file   Highest education level: Not on file  Occupational History   Not on file  Tobacco Use   Smoking status: Never   Smokeless tobacco: Never  Vaping Use   Vaping Use: Never used  Substance and Sexual Activity   Alcohol use: Not Currently    Comment: a 5th of liquor per day   Drug use: Yes    Frequency: 1.0 times per week    Types: Marijuana    Comment: Gives you chest pain - trying to quit   Sexual activity: Not Currently    Birth control/protection: Surgical  Other Topics Concern   Not on file  Social History Narrative   Not on file   Social Determinants of Health   Financial Resource Strain: Not on file  Food Insecurity: Not on file  Transportation Needs: Not on file  Physical Activity: Not on file  Stress: Not on file  Social Connections: Not on file   Additional Social History:    Allergies:  No Known Allergies  Labs:  Results for orders placed or performed during the hospital encounter of 05/22/21 (from the past 48 hour(s))  CBC     Status: Abnormal   Collection Time: 05/25/21 12:53 AM  Result Value Ref Range   WBC 3.5 (L) 4.0 - 10.5 K/uL   RBC 2.62 (L) 3.87 - 5.11 MIL/uL   Hemoglobin 8.3 (L) 12.0 - 15.0 g/dL   HCT 09.9 (L) 83.3 - 82.5 %   MCV 95.0 80.0 - 100.0 fL   MCH 31.7 26.0 - 34.0 pg   MCHC 33.3 30.0 - 36.0 g/dL   RDW 05.3 (H) 97.6 - 73.4 %   Platelets 113 (L) 150 - 400 K/uL    Comment: Immature Platelet Fraction may be clinically indicated, consider ordering this additional test [id number] CONSISTENT WITH PREVIOUS RESULT REPEATED TO VERIFY    nRBC 0.0 0.0 - 0.2 %    Comment: Performed at [facility], [address], [place], [place] [address]  Comprehensive metabolic panel     Status: Abnormal   Collection Time: 05/25/21 12:53 AM  Result Value Ref Range   Sodium 137 135 -  145 mmol/L   Potassium 3.7 3.5 - 5.1 mmol/L   Chloride 107 98 - 111 mmol/L   CO2 21 (L) 22 - 32 mmol/L   Glucose, Bld 84 70 - 99 mg/dL    Comment: Glucose reference range applies only to samples taken after fasting for at least 8 hours.   BUN <5 (L) 6 - 20 mg/dL   Creatinine, Ser 3.29 0.44 - 1.00 mg/dL   Calcium 7.5 (L) 8.9 - 10.3 mg/dL   Total Protein 6.1 (L) 6.5 - 8.1 g/dL   Albumin 2.9 (L) 3.5 - 5.0 g/dL   AST 924 (H) 15 - 41 U/L  ALT 28 0 - 44 U/L   Alkaline Phosphatase 72 38 - 126 U/L   Total Bilirubin 1.8 (H) 0.3 - 1.2 mg/dL   GFR, Estimated >16 >10 mL/min    Comment: (NOTE) Calculated using the CKD-EPI Creatinine Equation (2021)    Anion gap 9 5 - 15    Comment: Performed at [facility], [address], [place], [place] [address]  Magnesium     Status: None   Collection Time: 05/25/21 12:53 AM  Result Value Ref Range   Magnesium 1.7 1.7 - 2.4 mg/dL    Comment: Performed at [facility], [address], [place], [place] [address]  Phosphorus     Status: Abnormal   Collection Time: 05/25/21 12:53 AM  Result Value Ref Range   Phosphorus 1.9 (L) 2.5 - 4.6 mg/dL    Comment: Performed at [facility], [address], [place], [place] [address]  Magnesium     Status: Abnormal   Collection Time: 05/26/21  1:39 AM  Result Value Ref Range   Magnesium 1.2 (L) 1.7 - 2.4 mg/dL    Comment: Performed at [facility], [address] [place], [place] [address]  Comprehensive metabolic panel     Status: Abnormal   Collection Time: 05/26/21  1:39 AM  Result Value Ref Range   Sodium 140 135 - 145 mmol/L   Potassium 3.3 (L) 3.5 - 5.1 mmol/L   Chloride 112 (H) 98 - 111 mmol/L   CO2 22 22 - 32 mmol/L   Glucose, Bld 110 (H) 70 - 99 mg/dL    Comment: Glucose reference range applies only to samples taken after fasting for at least 8 hours.   BUN <5 (L) 6 - 20 mg/dL   Creatinine, Ser 2.13 0.44 - 1.00 mg/dL   Calcium 7.4 (L) 8.9 - 10.3 mg/dL   Total Protein 5.9  (L) 6.5 - 8.1 g/dL   Albumin 2.7 (L) 3.5 - 5.0 g/dL   AST 086 (H) 15 - 41 U/L   ALT 28 0 - 44 U/L   Alkaline Phosphatase 69 38 - 126 U/L   Total Bilirubin 1.8 (H) 0.3 - 1.2 mg/dL   GFR, Estimated >57 >84 mL/min    Comment: (NOTE) Calculated using the CKD-EPI Creatinine Equation (2021)    Anion gap 6 5 - 15    Comment: Performed at [facility], [address], [place], [place] [address]  Brain natriuretic peptide     Status: None   Collection Time: 05/26/21  1:39 AM  Result Value Ref Range   B Natriuretic Peptide 95.6 0.0 - 100.0 pg/mL    Comment: Performed at [facility], [address], [place], [place] [address]  CBC with Differential/Platelet     Status: Abnormal   Collection Time: 05/26/21  1:39 AM  Result Value Ref Range   WBC 2.8 (L) 4.0 - 10.5 K/uL   RBC 2.54 (L) 3.87 - 5.11 MIL/uL   Hemoglobin 7.8 (L) 12.0 - 15.0 g/dL   HCT 32.4 (L) 40.1 - 02.7 %   MCV 96.1 80.0 - 100.0 fL   MCH 30.7 26.0 - 34.0 pg   MCHC 32.0 30.0 - 36.0 g/dL   RDW 25.3 (H) 66.4 - 40.3 %   Platelets 117 (L) 150 - 400 K/uL    Comment: Immature Platelet Fraction may be clinically indicated, consider ordering this additional test [id number] REPEATED TO VERIFY    nRBC 0.0 0.0 -  0.2 %   Neutrophils Relative % 56 %   Neutro Abs 1.6 (L) 1.7 - 7.7 K/uL   Lymphocytes Relative 29 %   Lymphs Abs 0.8 0.7 - 4.0 K/uL   Monocytes Relative 12 %   Monocytes Absolute 0.3 0.1 - 1.0 K/uL   Eosinophils Relative 2 %   Eosinophils Absolute 0.1 0.0 - 0.5 K/uL   Basophils Relative 1 %   Basophils Absolute 0.0 0.0 - 0.1 K/uL   Immature Granulocytes 0 %   Abs Immature Granulocytes 0.00 0.00 - 0.07 K/uL    Comment: Performed at [facility], [address] [place], [place] [address]  Phosphorus     Status: Abnormal   Collection Time: 05/26/21  1:39 AM  Result Value Ref Range   Phosphorus 2.4 (L) 2.5 - 4.6 mg/dL    Comment: Performed at [facility], [address] [place], [place] [address]   Lipase, blood     Status: Abnormal   Collection Time: 05/26/21  1:39 AM  Result Value Ref Range   Lipase 107 (H) 11 - 51 U/L    Comment: Performed at [facility], [address] [place], [place] [address]    Current Facility-Administered Medications  Medication Dose Route Frequency Provider Last Rate Last Admin   acetaminophen (TYLENOL) tablet 650 mg  650 mg Oral Q6H PRN Howerter, Justin B, DO   650 mg at 05/26/21 1050   Or   acetaminophen (TYLENOL) suppository 650 mg  650 mg Rectal Q6H PRN Howerter, Justin B, DO       albuterol (PROVENTIL) (2.5 MG/3ML) 0.083% nebulizer solution 2.5 mg  2.5 mg Nebulization Q4H PRN Ghimire, Werner LeanShanker M, MD       alum & mag hydroxide-simeth (MAALOX/MYLANTA) 200-200-20 MG/5ML suspension 30 mL  30 mL Oral Q6H PRN Ghimire, Werner LeanShanker M, MD       amLODipine (NORVASC) tablet 10 mg  10 mg Oral Daily Howerter, Justin B, DO   10 mg at 05/26/21 1050   lactated ringers 1,000 mL with potassium chloride 20 mEq infusion   Intravenous Continuous Leroy SeaSingh, Prashant K, MD       LORazepam (ATIVAN) tablet 1-4 mg  1-4 mg Oral Q1H PRN Dow AdolphHall, Carole N, DO   1 mg at 05/26/21 [id number]   Or   LORazepam (ATIVAN) injection 1-4 mg  1-4 mg Intravenous Q1H PRN Dow AdolphHall, Carole N, DO   2 mg at 05/26/21 0820   metoprolol tartrate (LOPRESSOR) injection 5 mg  5 mg Intravenous Q4H PRN Ghimire, Werner LeanShanker M, MD       metoprolol tartrate (LOPRESSOR) tablet 50 mg  50 mg Oral BID Maretta BeesGhimire, Shanker M, MD   50 mg at 05/26/21 1051   morphine (PF) 4 MG/ML injection 4 mg  4 mg Intravenous Q3H PRN Howerter, Justin B, DO   4 mg at 05/26/21 1230   naloxone (NARCAN) injection 0.4 mg  0.4 mg Intravenous PRN Howerter, Justin B, DO       ondansetron (ZOFRAN) injection 4 mg  4 mg Intravenous Q6H PRN Howerter, Justin B, DO   4 mg at 05/25/21 0105   pantoprazole (PROTONIX) injection 40 mg  40 mg Intravenous Q12H Ghimire, Shanker M, MD   40 mg at 05/26/21 1051   potassium PHOSPHATE 30 mmol in dextrose 5 % 500 mL infusion   30 mmol Intravenous Once Leroy SeaSingh, Prashant K, MD 85 mL/hr at 05/26/21 1100 30 mmol at 05/26/21 1100   sucralfate (CARAFATE) 1 GM/10ML suspension  1 g  1 g Oral TID WC & HS Ghimire, Werner Lean, MD   1 g at 05/26/21 1230              Psychiatric Specialty Exam:  Presentation  General Appearance: Casual  Eye Contact:Good  Speech:Slow  Speech Volume:Decreased  Handedness:No data recorded  Mood and Affect  Mood:Anxious; Depressed; Dysphoric; Hopeless; Worthless  Affect:Appropriate; Congruent; Constricted   Thought Process  Thought Processes:Linear  Descriptions of Associations:Intact  Orientation:No data recorded Thought Content:Logical  History of Schizophrenia/Schizoaffective disorder:No data recorded Duration of Psychotic Symptoms:No data recorded Hallucinations:Hallucinations: None  Ideas of Reference:None  Suicidal Thoughts:Suicidal Thoughts: Yes, Passive SI Passive Intent and/or Plan: Without Intent; Without Plan  Homicidal Thoughts:Homicidal Thoughts: No   Sensorium  Memory:Immediate Good; Recent Good; Remote Good  Judgment:Fair  Insight:Fair   Executive Functions  Concentration:Fair  Attention Span:Fair  Recall:No data recorded Fund of Knowledge:No data recorded Language:No data recorded  Psychomotor Activity  Psychomotor Activity:No data recorded  Assets  Assets:No data recorded  Sleep  Sleep:Sleep: Poor   Physical Exam: Physical Exam Vitals reviewed.   Review of Systems  Psychiatric/Behavioral:  Positive for depression, substance abuse and suicidal ideas. The patient is nervous/anxious and has insomnia.    Blood pressure 114/70, pulse 79, temperature 98.8 F (37.1 C), temperature source Oral, resp. rate 16, height  (1.6 m), weight 62.2 kg, SpO2 99 %. Body mass index is 24.29 kg/m.  Treatment Plan Summary: Daily contact with patient to assess and evaluate symptoms and progress in treatment  Assessment: MDD, severe,  recurrent, without psychosis GAD Rule out panic disorder PTSD Alcohol use disorder, severe  Plan: -Patient requires psychiatric admission to inpatient psychiatric unit, once medically cleared.  Consult social work when medically clear, to coordinate admission to inpatient psychiatric unit. -Continue one-to-one sitter -Start Remeron 15 mg nightly for MDD, GAD, insomnia, loss of appetite  Psychiatric team will follow at a distance   Disposition: Recommend psychiatric Inpatient admission when medically cleared.  Cristy Hilts, MD 05/26/2021 12:47 PM  Total Time Spent in Direct Patient Care:  I personally spent 60 minutes on the unit in direct patient care. The direct patient care time included face-to-face time with the patient, reviewing the patient's chart, communicating with other professionals, and coordinating care. Greater than 50% of this time was spent in counseling or coordinating care with the patient regarding goals of hospitalization, psycho-education, and discharge planning needs.   Phineas Inches, MD Psychiatrist

## 2021-05-26 NOTE — Progress Notes (Addendum)
PROGRESS NOTE        PATIENT DETAILS Name: Nicole Hughes Age: 51 y.o. Sex: female Date of Birth: 1970/02/14 Admit Date: 05/22/2021 Admitting Physician Angie Fava, DO PCP:Pcp, No  Brief Summary: Patient is a 51 y.o.  female with history of EtOH use, GERD, HTN-who presented to the ED with epigastric pain-she was thought to have pancreatitis-alcohol withdrawal-and subsequently admitted to the hospitalist service.   Significant events: 5/17>> admit to [facility]-epigastric pain-EtOH withdrawal  Significant studies: 5/17>> CXR: No PNA 5/17>> CT abdomen/pelvis: No acute abnormality. 5/17>> lipase: 141  Significant microbiology data: 5/17>> COVID/influenza PCR: Negative  Procedures: None  Consults: Psych  Subjective:   Patient in bed, mildly confused but comfortable, still has some ongoing abdominal pain but overall getting better, no focal deficits, no chest pain no shortness of breath   Objective: Vitals: Blood pressure 114/80, pulse 93, temperature 98.3 F (36.8 C), temperature source Oral, resp. rate 17, height 5\' 3"  (1.6 m), weight 62.2 kg, SpO2 99 %.   Exam:  Awake Alert, No new F.N deficits, flat affect [place].AT,PERRAL Supple Neck, No JVD,   Symmetrical Chest wall movement, Good air movement bilaterally, CTAB RRR,No Gallops, Rubs or new Murmurs,  +ve B.Sounds, Abd Soft, No tenderness,   No Cyanosis, Clubbing or edema     Assessment/Plan:  Epigastric pain-likely due to acute alcoholic pancreatitis: Diagnosed clinically with epigastric pain, alcohol abuse and elevated lipase, CT scan of the abdomen pelvis was reassuring, strictly counseled to quit alcohol, after challenge with soft diet pain got slightly worse on 05/26/2021, n.p.o. except medications continue IV fluids and supportive care.  Alcoholic hepatitis: Mild-supportive care.  EtOH withdrawal: In early DTs, improving with CIWA protocol.  Strictly counseled to quit alcohol.  HTN:  BP relatively stable-continue metoprolol and amlodipine.  Hypomagnesemia: Replete and recheck  Normocytic anemia: Chronic issue-watch closely.  Transfuse if Hb <7.  Probably due to bone marrow suppression from alcohol use.  Suicidal ideation night of 05/25/2021.  Psych called, suicide precautions.    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  Underweight: Estimated body mass index is 24.29 kg/m as calculated from the following:   Height as of this encounter: 5\' 3"  (1.6 m).   Weight as of this encounter: 62.2 kg.   Code status:   Code Status: Full Code   DVT Prophylaxis: SCDs Start: 05/23/21 0017   Family Communication: None at bedside   Disposition Plan: Status is: Inpatient Remains inpatient appropriate because: Epigastric pain-ongoing withdrawal symptoms-not stable for discharge.   Planned Discharge Destination:Home   Diet: Diet Order             Diet NPO time specified Except for: Other (See Comments)  Diet effective now                     Antimicrobial agents: Anti-infectives (From admission, onward)    None        MEDICATIONS: Scheduled Meds:  amLODipine  10 mg Oral Daily   metoprolol tartrate  50 mg Oral BID   pantoprazole (PROTONIX) IV  40 mg Intravenous Q12H   sucralfate  1 g Oral TID WC & HS   Continuous Infusions:  lactated ringers     potassium PHOSPHATE IVPB (in mmol)     PRN Meds:.acetaminophen **OR** acetaminophen, albuterol, alum & mag hydroxide-simeth, LORazepam **OR** LORazepam, metoprolol tartrate,  morphine injection, naLOXone (NARCAN)  injection, ondansetron (ZOFRAN) IV   I have personally reviewed following labs and imaging studies  LABORATORY DATA:  Recent Labs  Lab 05/22/21 1633 05/23/21 0237 05/24/21 0213 05/25/21 0053 05/26/21 0139  WBC 6.0 4.5 4.5 3.5* 2.8*  HGB 9.5* 8.7* 8.6* 8.3* 7.8*  HCT 29.7* 27.3* 27.1* 24.9* 24.4*  PLT 130* 99* 103* 113* 117*  MCV 94.6  94.5 94.4 95.0 96.1  MCH 30.3 30.1 30.0 31.7 30.7  MCHC 32.0 31.9 31.7 33.3 32.0  RDW Not Measured 24.4* 23.1* 22.7* 22.5*  LYMPHSABS  --  1.1  --   --  0.8  MONOABS  --  0.5  --   --  0.3  EOSABS  --  0.1  --   --  0.1  BASOSABS  --  0.1  --   --  0.0    Recent Labs  Lab 05/22/21 1633 05/23/21 0237 05/23/21 1418 05/24/21 0213 05/25/21 0053 05/26/21 0139  NA 136 137  --  136 137 140  K 4.9 4.3  --  3.5 3.7 3.3*  CL 107 107  --  106 107 112*  CO2 20* 18*  --  22 21* 22  GLUCOSE 83 68*  --  74 84 110*  BUN 12 11  --  <5* <5* <5*  CREATININE 0.81 0.72  --  0.60 0.63 0.54  CALCIUM 8.8* 7.8*  --  7.4* 7.5* 7.4*  AST 264* 198*  --  159* 154* 131*  ALT 33 29  --  25 28 28   ALKPHOS 90 81  --  70 72 69  BILITOT 1.6* 2.4*  --  2.5* 1.8* 1.8*  ALBUMIN 3.5 3.0*  --  2.9* 2.9* 2.7*  MG  --  1.2*  --  1.5* 1.7 1.2*  PHOS  --  2.6  --  1.8* 1.9* 2.4*  INR  --  1.2  --   --   --   --   AMMONIA  --   --  61*  --   --   --   BNP  --   --   --   --   --  95.6    RADIOLOGY STUDIES/RESULTS: DG Abd Portable 1V  Result Date: 05/26/2021 CLINICAL DATA:  Nausea and emesis.  Right-sided abdominal pain. EXAM: PORTABLE ABDOMEN - 1 VIEW COMPARISON:  CT scan of the abdomen and pelvis May 22, 2021 FINDINGS: Air seen within nondilated colon. There is a paucity of small bowel gas. No evidence of obstruction on today's study. No free air, portal venous gas, or pneumatosis identified on this single supine image. Both kidneys are obscured by bowel contents but no renal or ureteral stones are identified. No other acute abnormalities. IMPRESSION: Negative. Electronically Signed   By: May 24, 2021 III M.D.   On: 05/26/2021 09:24     LOS: 3 days   Signature  05/28/2021 M.D on 05/26/2021 at 11:05 AM   -  To page go to [email or web address]

## 2021-05-27 DIAGNOSIS — R1013 Epigastric pain: Secondary | ICD-10-CM | POA: Diagnosis not present

## 2021-05-27 LAB — CBC WITH DIFFERENTIAL/PLATELET
Abs Immature Granulocytes: 0.01 10*3/uL (ref 0.00–0.07)
Basophils Absolute: 0.1 10*3/uL (ref 0.0–0.1)
Basophils Relative: 2 %
Eosinophils Absolute: 0.1 10*3/uL (ref 0.0–0.5)
Eosinophils Relative: 3 %
HCT: 27.3 % — ABNORMAL LOW (ref 36.0–46.0)
Hemoglobin: 8.8 g/dL — ABNORMAL LOW (ref 12.0–15.0)
Immature Granulocytes: 0 %
Lymphocytes Relative: 32 %
Lymphs Abs: 1 10*3/uL (ref 0.7–4.0)
MCH: 31.2 pg (ref 26.0–34.0)
MCHC: 32.2 g/dL (ref 30.0–36.0)
MCV: 96.8 fL (ref 80.0–100.0)
Monocytes Absolute: 0.3 10*3/uL (ref 0.1–1.0)
Monocytes Relative: 10 %
Neutro Abs: 1.7 10*3/uL (ref 1.7–7.7)
Neutrophils Relative %: 53 %
Platelets: 145 10*3/uL — ABNORMAL LOW (ref 150–400)
RBC: 2.82 MIL/uL — ABNORMAL LOW (ref 3.87–5.11)
RDW: 22.2 % — ABNORMAL HIGH (ref 11.5–15.5)
WBC: 3.3 10*3/uL — ABNORMAL LOW (ref 4.0–10.5)
nRBC: 0 % (ref 0.0–0.2)

## 2021-05-27 LAB — COMPREHENSIVE METABOLIC PANEL
ALT: 31 U/L (ref 0–44)
AST: 118 U/L — ABNORMAL HIGH (ref 15–41)
Albumin: 2.9 g/dL — ABNORMAL LOW (ref 3.5–5.0)
Alkaline Phosphatase: 71 U/L (ref 38–126)
Anion gap: 6 (ref 5–15)
BUN: <5 mg/dL — ABNORMAL LOW (ref 6–20)
CO2: 24 mmol/L (ref 22–32)
Calcium: 8.2 mg/dL — ABNORMAL LOW (ref 8.9–10.3)
Chloride: 110 mmol/L (ref 98–111)
Creatinine, Ser: 0.57 mg/dL (ref 0.44–1.00)
GFR, Estimated: >60 mL/min (ref >60)
Glucose, Bld: 81 mg/dL (ref 70–99)
Potassium: 4.1 mmol/L (ref 3.5–5.1)
Sodium: 140 mmol/L (ref 135–145)
Total Bilirubin: 1.7 mg/dL — ABNORMAL HIGH (ref 0.3–1.2)
Total Protein: 6.3 g/dL — ABNORMAL LOW (ref 6.5–8.1)

## 2021-05-27 LAB — BRAIN NATRIURETIC PEPTIDE: B Natriuretic Peptide: 123.8 pg/mL — ABNORMAL HIGH (ref 0.0–100.0)

## 2021-05-27 LAB — MAGNESIUM: Magnesium: 1.5 mg/dL — ABNORMAL LOW (ref 1.7–2.4)

## 2021-05-27 LAB — PHOSPHORUS: Phosphorus: 2.4 mg/dL — ABNORMAL LOW (ref 2.5–4.6)

## 2021-05-27 LAB — LIPASE, BLOOD: Lipase: 81 U/L — ABNORMAL HIGH (ref 11–51)

## 2021-05-27 MED ORDER — PROCHLORPERAZINE EDISYLATE 10 MG/2ML IJ SOLN
10.0000 mg | Freq: Once | INTRAMUSCULAR | Status: AC
  Administered 2021-05-27: 10 mg via INTRAVENOUS
  Filled 2021-05-27: qty 2

## 2021-05-27 MED ORDER — ENOXAPARIN SODIUM 40 MG/0.4ML IJ SOSY
40.0000 mg | PREFILLED_SYRINGE | INTRAMUSCULAR | Status: DC
Start: 2021-05-27 — End: 2021-06-01
  Administered 2021-05-27 – 2021-05-31 (×5): 40 mg via SUBCUTANEOUS
  Filled 2021-05-27 (×6): qty 0.4

## 2021-05-27 MED ORDER — LIDOCAINE VISCOUS HCL 2 % MT SOLN
15.0000 mL | Freq: Once | OROMUCOSAL | Status: AC
  Administered 2021-05-27: 15 mL via ORAL
  Filled 2021-05-27: qty 15

## 2021-05-27 MED ORDER — MAGNESIUM SULFATE 2 GM/50ML IV SOLN
2.0000 g | Freq: Once | INTRAVENOUS | Status: AC
  Administered 2021-05-27: 2 g via INTRAVENOUS
  Filled 2021-05-27: qty 50

## 2021-05-27 MED ORDER — MAGNESIUM SULFATE 4 GM/100ML IV SOLN
4.0000 g | Freq: Once | INTRAVENOUS | Status: AC
  Administered 2021-05-27: 4 g via INTRAVENOUS
  Filled 2021-05-27: qty 100

## 2021-05-27 MED ORDER — ONDANSETRON HCL 4 MG/2ML IJ SOLN
INTRAMUSCULAR | Status: AC
  Administered 2021-05-27: 4 mg via INTRAVENOUS
  Filled 2021-05-27: qty 2

## 2021-05-27 MED ORDER — POTASSIUM CHLORIDE 2 MEQ/ML IV SOLN
INTRAVENOUS | Status: DC
  Filled 2021-05-27 (×3): qty 1000

## 2021-05-27 MED ORDER — DEXTROSE 5 % IV SOLN
30.0000 mmol | Freq: Once | INTRAVENOUS | Status: AC
  Administered 2021-05-27: 30 mmol via INTRAVENOUS
  Filled 2021-05-27: qty 10

## 2021-05-27 MED ORDER — ALUM & MAG HYDROXIDE-SIMETH 200-200-20 MG/5ML PO SUSP
30.0000 mL | Freq: Once | ORAL | Status: AC
  Administered 2021-05-27: 30 mL via ORAL
  Filled 2021-05-27: qty 30

## 2021-05-27 NOTE — Progress Notes (Signed)
PROGRESS NOTE        PATIENT DETAILS Name: Nicole Hughes Age: 51 y.o. Sex: female Date of Birth: Jul 30, 1970 Admit Date: 05/22/2021 Admitting Physician Rhetta Mura, DO PCP:Pcp, No  Brief Summary: Patient is a 51 y.o.  female with history of EtOH use, GERD, HTN-who presented to the ED with epigastric pain-she was thought to have pancreatitis-alcohol withdrawal-and subsequently admitted to the hospitalist service.   Significant events: 5/17>> admit to [facility]-epigastric pain-EtOH withdrawal  Significant studies: 5/17>> CXR: No PNA 5/17>> CT abdomen/pelvis: No acute abnormality. 5/17>> lipase: 141  Significant microbiology data: 5/17>> COVID/influenza PCR: Negative  Procedures: None  Consults: Psych  Subjective:   Patient in bed, appears comfortable, denies any headache, no fever, no chest pain or pressure, no shortness of breath , no abdominal pain. No new focal weakness.   Objective: Vitals: Blood pressure 127/62, pulse 93, temperature 98.6 F (37 C), temperature source Oral, resp. rate 20, height 5\' 3"  (1.6 m), weight 62 kg, SpO2 100 %.   Exam:  Awake Alert, No new F.N deficits, Normal affect [place].AT,PERRAL Supple Neck, No JVD,   Symmetrical Chest wall movement, Good air movement bilaterally, CTAB RRR,No Gallops, Rubs or new Murmurs,  +ve B.Sounds, Abd Soft, No tenderness,   No Cyanosis, Clubbing or edema     Assessment/Plan:  Epigastric pain-likely due to acute alcoholic pancreatitis: Diagnosed clinically with epigastric pain, alcohol abuse and elevated lipase, CT scan of the abdomen pelvis was reassuring, strictly counseled to quit alcohol, pain much better after bowel rest challenge with clear liquids on 05/27/2021, continue IV fluids and supportive care.  Alcoholic hepatitis: Mild-supportive care.  EtOH withdrawal: In early DTs, improving with CIWA protocol.  Strictly counseled to quit alcohol.  HTN: BP relatively  stable-continue metoprolol and amlodipine.  Hypomagnesemia: Replete and recheck  Normocytic anemia: Chronic issue-watch closely.  Transfuse if Hb <7.  Probably due to bone marrow suppression from alcohol use.  Suicidal ideation night of 05/25/2021.  Psych called, continue suicidal precautions, per psych [facility] admission once medically cleared.  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  Underweight: Estimated body mass index is 24.2 kg/m as calculated from the following:   Height as of this encounter: 5\' 3"  (1.6 m).   Weight as of this encounter: 62 kg.   Code status:   Code Status: Full Code   DVT Prophylaxis: Start Lovenox on 05/27/2021. SCDs Start: 05/23/21 0017   Family Communication: None at bedside   Disposition Plan: Post discharge to inpatient psych facility.   Diet: Diet Order             Diet clear liquid Room service appropriate? Yes; Fluid consistency: Thin  Diet effective now                     Antimicrobial agents: Anti-infectives (From admission, onward)    None        MEDICATIONS: Scheduled Meds:  amLODipine  10 mg Oral Daily   metoprolol tartrate  50 mg Oral BID   mirtazapine  15 mg Oral QHS   pantoprazole (PROTONIX) IV  40 mg Intravenous Q12H   sucralfate  1 g Oral TID WC & HS   Continuous Infusions:  lactated ringers with kcl 75 mL/hr at 05/27/21 0826   sodium phosphate  Dextrose 5% IVPB     PRN Meds:.acetaminophen **OR** acetaminophen,  albuterol, alum & mag hydroxide-simeth, LORazepam **OR** LORazepam, metoprolol tartrate, morphine injection, naLOXone (NARCAN)  injection, ondansetron (ZOFRAN) IV   I have personally reviewed following labs and imaging studies  LABORATORY DATA:  Recent Labs  Lab 05/23/21 0237 05/24/21 0213 05/25/21 0053 05/26/21 0139 05/27/21 0206  WBC 4.5 4.5 3.5* 2.8* 3.3*  HGB 8.7* 8.6* 8.3* 7.8* 8.8*  HCT 27.3* 27.1* 24.9* 24.4* 27.3*  PLT 99* 103* 113*  117* 145*  MCV 94.5 94.4 95.0 96.1 96.8  MCH 30.1 30.0 31.7 30.7 31.2  MCHC 31.9 31.7 33.3 32.0 32.2  RDW 24.4* 23.1* 22.7* 22.5* 22.2*  LYMPHSABS 1.1  --   --  0.8 1.0  MONOABS 0.5  --   --  0.3 0.3  EOSABS 0.1  --   --  0.1 0.1  BASOSABS 0.1  --   --  0.0 0.1    Recent Labs  Lab 05/23/21 0237 05/23/21 1418 05/24/21 0213 05/25/21 0053 05/26/21 0139 05/27/21 0206  NA 137  --  136 137 140 140  K 4.3  --  3.5 3.7 3.3* 4.1  CL 107  --  106 107 112* 110  CO2 18*  --  22 21* 22 24  GLUCOSE 68*  --  74 84 110* 81  BUN 11  --  <5* <5* <5* <5*  CREATININE 0.72  --  0.60 0.63 0.54 0.57  CALCIUM 7.8*  --  7.4* 7.5* 7.4* 8.2*  AST 198*  --  159* 154* 131* 118*  ALT 29  --  25 28 28 31   ALKPHOS 81  --  70 72 69 71  BILITOT 2.4*  --  2.5* 1.8* 1.8* 1.7*  ALBUMIN 3.0*  --  2.9* 2.9* 2.7* 2.9*  MG 1.2*  --  1.5* 1.7 1.2* 1.5*  PHOS 2.6  --  1.8* 1.9* 2.4* 2.4*  INR 1.2  --   --   --   --   --   AMMONIA  --  61*  --   --   --   --   BNP  --   --   --   --  95.6 123.8*    RADIOLOGY STUDIES/RESULTS: DG Abd Portable 1V  Result Date: 05/26/2021 CLINICAL DATA:  Nausea and emesis.  Right-sided abdominal pain. EXAM: PORTABLE ABDOMEN - 1 VIEW COMPARISON:  CT scan of the abdomen and pelvis May 22, 2021 FINDINGS: Air seen within nondilated colon. There is a paucity of small bowel gas. No evidence of obstruction on today's study. No free air, portal venous gas, or pneumatosis identified on this single supine image. Both kidneys are obscured by bowel contents but no renal or ureteral stones are identified. No other acute abnormalities. IMPRESSION: Negative. Electronically Signed   By: Dorise Bullion III M.D.   On: 05/26/2021 09:24     LOS: 4 days   Signature  Lala Lund M.D on 05/27/2021 at 10:54 AM   -  To page go to [email or web address]

## 2021-05-28 DIAGNOSIS — R1013 Epigastric pain: Secondary | ICD-10-CM | POA: Diagnosis not present

## 2021-05-28 LAB — COMPREHENSIVE METABOLIC PANEL
ALT: 28 U/L (ref 0–44)
AST: 101 U/L — ABNORMAL HIGH (ref 15–41)
Albumin: 2.5 g/dL — ABNORMAL LOW (ref 3.5–5.0)
Alkaline Phosphatase: 63 U/L (ref 38–126)
Anion gap: 5 (ref 5–15)
BUN: <5 mg/dL — ABNORMAL LOW (ref 6–20)
CO2: 23 mmol/L (ref 22–32)
Calcium: 8.1 mg/dL — ABNORMAL LOW (ref 8.9–10.3)
Chloride: 111 mmol/L (ref 98–111)
Creatinine, Ser: 0.63 mg/dL (ref 0.44–1.00)
GFR, Estimated: >60 mL/min (ref >60)
Glucose, Bld: 97 mg/dL (ref 70–99)
Potassium: 4 mmol/L (ref 3.5–5.1)
Sodium: 139 mmol/L (ref 135–145)
Total Bilirubin: 1.5 mg/dL — ABNORMAL HIGH (ref 0.3–1.2)
Total Protein: 5.6 g/dL — ABNORMAL LOW (ref 6.5–8.1)

## 2021-05-28 LAB — CBC WITH DIFFERENTIAL/PLATELET
Abs Immature Granulocytes: 0.02 10*3/uL (ref 0.00–0.07)
Basophils Absolute: 0 10*3/uL (ref 0.0–0.1)
Basophils Relative: 1 %
Eosinophils Absolute: 0.1 10*3/uL (ref 0.0–0.5)
Eosinophils Relative: 2 %
HCT: 24.5 % — ABNORMAL LOW (ref 36.0–46.0)
Hemoglobin: 8 g/dL — ABNORMAL LOW (ref 12.0–15.0)
Immature Granulocytes: 1 %
Lymphocytes Relative: 30 %
Lymphs Abs: 0.8 10*3/uL (ref 0.7–4.0)
MCH: 31.4 pg (ref 26.0–34.0)
MCHC: 32.7 g/dL (ref 30.0–36.0)
MCV: 96.1 fL (ref 80.0–100.0)
Monocytes Absolute: 0.4 10*3/uL (ref 0.1–1.0)
Monocytes Relative: 16 %
Neutro Abs: 1.2 10*3/uL — ABNORMAL LOW (ref 1.7–7.7)
Neutrophils Relative %: 50 %
Platelets: 116 10*3/uL — ABNORMAL LOW (ref 150–400)
RBC: 2.55 MIL/uL — ABNORMAL LOW (ref 3.87–5.11)
RDW: 21.5 % — ABNORMAL HIGH (ref 11.5–15.5)
WBC: 2.5 10*3/uL — ABNORMAL LOW (ref 4.0–10.5)
nRBC: 0 % (ref 0.0–0.2)

## 2021-05-28 LAB — BRAIN NATRIURETIC PEPTIDE: B Natriuretic Peptide: 89.9 pg/mL (ref 0.0–100.0)

## 2021-05-28 LAB — MAGNESIUM: Magnesium: 1.4 mg/dL — ABNORMAL LOW (ref 1.7–2.4)

## 2021-05-28 LAB — PHOSPHORUS: Phosphorus: 2.7 mg/dL (ref 2.5–4.6)

## 2021-05-28 MED ORDER — KCL IN DEXTROSE-NACL 10-5-0.45 MEQ/L-%-% IV SOLN
INTRAVENOUS | Status: DC
Start: 2021-05-28 — End: 2021-05-29
  Filled 2021-05-28 (×2): qty 1000

## 2021-05-28 MED ORDER — MORPHINE SULFATE (PF) 2 MG/ML IV SOLN
2.0000 mg | INTRAVENOUS | Status: DC | PRN
  Administered 2021-05-29 – 2021-06-01 (×8): 2 mg via INTRAVENOUS
  Filled 2021-05-28 (×8): qty 1

## 2021-05-28 MED ORDER — MAGNESIUM SULFATE 4 GM/100ML IV SOLN
4.0000 g | Freq: Once | INTRAVENOUS | Status: AC
  Administered 2021-05-28: 4 g via INTRAVENOUS
  Filled 2021-05-28: qty 100

## 2021-05-28 MED ORDER — HYDROCODONE-ACETAMINOPHEN 5-325 MG PO TABS
1.0000 | ORAL_TABLET | ORAL | Status: DC | PRN
  Administered 2021-05-28 – 2021-06-02 (×17): 1 via ORAL
  Filled 2021-05-28 (×17): qty 1

## 2021-05-28 NOTE — Progress Notes (Signed)
PROGRESS NOTE        PATIENT DETAILS Name: Nicole Hughes Age: 51 y.o. Sex: female Date of Birth: [birth year] Admit Date: 05/22/2021 Admitting Physician Angie Fava, DO PCP:Pcp, No  Brief Summary: Patient is a 51 y.o.  female with history of EtOH use, GERD, HTN-who presented to the ED with epigastric pain-she was thought to have pancreatitis-alcohol withdrawal-and subsequently admitted to the hospitalist service.   Significant events: 5/17>> admit to [facility]-epigastric pain-EtOH withdrawal  Significant studies: 5/17>> CXR: No PNA 5/17>> CT abdomen/pelvis: No acute abnormality. 5/17>> lipase: 141  Significant microbiology data: 5/17>> COVID/influenza PCR: Negative  Procedures: None  Consults: Psych  Subjective:  Patient in bed, appears comfortable, denies any headache, no fever, no chest pain or pressure, no shortness of breath, +ve epigastric abdominal pain. No new focal weakness.  Objective: Vitals: Blood pressure 129/81, pulse 90, temperature 98.8 F (37.1 C), temperature source Oral, resp. rate 16, height 5\' 3"  (1.6 m), weight 62 kg, SpO2 100 %.   Exam:  Awake Alert, No new F.N deficits, Normal affect [place].AT,PERRAL Supple Neck, No JVD,   Symmetrical Chest wall movement, Good air movement bilaterally, CTAB RRR,No Gallops, Rubs or new Murmurs,  +ve B.Sounds, Abd Soft, No tenderness,   No Cyanosis, Clubbing or edema    Assessment/Plan:  Epigastric pain-likely due to acute alcoholic pancreatitis: Diagnosed clinically with epigastric pain, alcohol abuse and elevated lipase, CT scan of the abdomen pelvis was reassuring, strictly counseled to quit alcohol, pain had improved but with diet challenge pain got worse on 05/28/2021, strict bowel rest, IV fluids and supportive care.  Exam is benign hemodynamically stable.  Alcoholic hepatitis: Mild-supportive care.  EtOH withdrawal: In early DTs, improving with CIWA protocol.  Strictly counseled  to quit alcohol.  HTN: BP relatively stable-continue metoprolol and amlodipine.  Hypomagnesemia and hypophosphatemia: Repleted and recheck  Normocytic anemia: Chronic issue-watch closely.  Transfuse if Hb <7.  Probably due to bone marrow suppression from alcohol use.  Suicidal ideation night of 05/25/2021.  Psych called, continue suicidal precautions, per psych [facility] admission once medically cleared.  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  Underweight: Estimated body mass index is 24.2 kg/m as calculated from the following:   Height as of this encounter: 5\' 3"  (1.6 m).   Weight as of this encounter: 62 kg.   Code status:   Code Status: Full Code   DVT Prophylaxis: Start Lovenox on 05/27/2021. enoxaparin (LOVENOX) injection 40 mg Start: 05/27/21 1145 SCDs Start: 05/23/21 0017   Family Communication: None at bedside   Disposition Plan: Post discharge to inpatient psych facility.   Diet: Diet Order             Diet NPO time specified Except for: Sips with Meds  Diet effective now                     Antimicrobial agents: Anti-infectives (From admission, onward)    None        MEDICATIONS: Scheduled Meds:  amLODipine  10 mg Oral Daily   enoxaparin (LOVENOX) injection  40 mg Subcutaneous Q24H   metoprolol tartrate  50 mg Oral BID   mirtazapine  15 mg Oral QHS   pantoprazole (PROTONIX) IV  40 mg Intravenous Q12H   sucralfate  1 g Oral TID WC & HS   Continuous Infusions:  dextrose  5 % and 0.45 % NaCl with KCl 10 mEq/L 75 mL/hr at 05/28/21 0932   PRN Meds:.acetaminophen **OR** acetaminophen, albuterol, alum & mag hydroxide-simeth, HYDROcodone-acetaminophen, metoprolol tartrate, morphine injection, naLOXone (NARCAN)  injection, ondansetron (ZOFRAN) IV   I have personally reviewed following labs and imaging studies  LABORATORY DATA:  Recent Labs  Lab 05/23/21 0237 05/24/21 0213 05/25/21 0053  05/26/21 0139 05/27/21 0206 05/28/21 0237  WBC 4.5 4.5 3.5* 2.8* 3.3* 2.5*  HGB 8.7* 8.6* 8.3* 7.8* 8.8* 8.0*  HCT 27.3* 27.1* 24.9* 24.4* 27.3* 24.5*  PLT 99* 103* 113* 117* 145* 116*  MCV 94.5 94.4 95.0 96.1 96.8 96.1  MCH 30.1 30.0 31.7 30.7 31.2 31.4  MCHC 31.9 31.7 33.3 32.0 32.2 32.7  RDW 24.4* 23.1* 22.7* 22.5* 22.2* 21.5*  LYMPHSABS 1.1  --   --  0.8 1.0 0.8  MONOABS 0.5  --   --  0.3 0.3 0.4  EOSABS 0.1  --   --  0.1 0.1 0.1  BASOSABS 0.1  --   --  0.0 0.1 0.0    Recent Labs  Lab 05/23/21 0237 05/23/21 1418 05/24/21 0213 05/25/21 0053 05/26/21 0139 05/27/21 0206 05/28/21 0237  NA 137  --  136 137 140 140 139  K 4.3  --  3.5 3.7 3.3* 4.1 4.0  CL 107  --  106 107 112* 110 111  CO2 18*  --  22 21* 22 24 23   GLUCOSE 68*  --  74 84 110* 81 97  BUN 11  --  <5* <5* <5* <5* <5*  CREATININE 0.72  --  0.60 0.63 0.54 0.57 0.63  CALCIUM 7.8*  --  7.4* 7.5* 7.4* 8.2* 8.1*  AST 198*  --  159* 154* 131* 118* 101*  ALT 29  --  25 28 28 31 28   ALKPHOS 81  --  70 72 69 71 63  BILITOT 2.4*  --  2.5* 1.8* 1.8* 1.7* 1.5*  ALBUMIN 3.0*  --  2.9* 2.9* 2.7* 2.9* 2.5*  MG 1.2*  --  1.5* 1.7 1.2* 1.5* 1.4*  PHOS 2.6  --  1.8* 1.9* 2.4* 2.4* 2.7  INR 1.2  --   --   --   --   --   --   AMMONIA  --  61*  --   --   --   --   --   BNP  --   --   --   --  95.6 123.8* 89.9    RADIOLOGY STUDIES/RESULTS: No results found.   LOS: 5 days   Signature  M.D on 05/28/2021 at 10:18 AM   -  To page go to [email or web address]

## 2021-05-28 NOTE — Consult Note (Addendum)
[facility] Face-to-Face Psychiatry Consult   Reason for Consult:  suicide ideation Referring Physician:  Delma Post Patient Identification: Nicole Hughes MRN:  [id number] Principal Diagnosis: Epigastric pain Diagnosis:  Principal Problem:   Epigastric pain Active Problems:   Essential hypertension   Chronic alcohol abuse   Chronic anemia   Alcohol withdrawal (HCC)   Transaminitis   Total Time spent with patient: 30 minutes  Subjective:   Nicole Hughes is a 51 y.o. female with medical history significant for chronic alcohol abuse, GERD, essential hypertension, chronic anemia associated baseline hemoglobin 8-11, who is admitted to [facility] on 05/22/2021 with acute epigastric pain after presenting from home to [facility] ED complaining of such.   HPI:   Today upon evaluation patient is appropriate and alert. She appears to be engaging well with staff, family, and Clinical research associate. She acknowledges her weaknesses and states "I have found my purpose now. "  She is able to show further insight into her depression, as well as identify her protective factors to include herself " I need to find myself again and feel like I used to feel.  And second it is my family my husband, 2 girls, and grandson".  She is unable to identify strong coping skills at this time, however hopes to gain new coping skills with inpatient psychiatric hospitalization.  She is able to identify her alcohol use and the impact it has had on her physical and mental health.  She is agreeing to achieve and maintain sobriety after this hospitalization.  She reports tolerating mirtazapine 15 mg p.o. nightly, well at this time.  She identifies no new side effects and or adverse reactions as it relates to that medication.  She does have some ongoing GI symptoms which is likely related to her pancreatitis, and currently being treated appropriately by hospitalist.  Patient remains n.p.o. at this time, denies having an appetite.  She reports her sleep is  fair.  Overall patient continues to endorse much improvement of her symptoms, to include alcohol withdrawal symptoms.  She is able to perform straight arm raise, with minimal tremors in right hand and no tremors in the left hand.  She denies any suicidal ideations, suicidal thoughts, and or self-harm.  She is able to contract for safety at this time, citing " if those thoughts return we will notify my nurse or whoever comes in my room first.  I will not let that happen.  I understand the told that suicide would take all my family, and I do not want them living with that regret. "  She denies any suicidal thoughts, homicidal thoughts, and or auditory visual hallucinations.  She does not appear to be responding to internal stimuli.     Past Psychiatric History:  Diagnosis: MDD Denies any previous psychiatric hospitalizations Denies any previous suicide attempts Current psychiatric medications: None Psychiatric medication history: Patient reports taking 8 psychiatric medications, around 2012, but does not remember what any of these medications were.  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   GERD (gastroesophageal reflux disease)    Hay fever    "fall and spring" (02/17/2017)   Headache    "2-3 times/.wik" (02/17/2017)   History of chicken pox    Hypertension    Migraine    "monthly" (02/17/2017)   Pelvic pain    Pulmonary embolism (HCC) 08/1998   S/P childbirth    Past Surgical History:  Procedure Laterality Date   CESAREAN SECTION  1994    ECTOPIC PREGNANCY SURGERY  2010   LAPAROSCOPY N/A 09/24/2012   Procedure: OPERATIVE LAPROSCOPY WITH LYSIS OF ADHESIONS;  Surgeon: Sherron MondayJody [name], MD;  Location: [facility];  Service: Gynecology;  Laterality: N/A;   TONSILLECTOMY  1984   TUBAL LIGATION  2010   Family History:  Family History  Problem Relation Age of Onset   Cancer - Lung Mother    Heart failure Father    Hypertension Father    Family Psychiatric  History:  Mother attempted suicide  Social History:  Social History   Substance and Sexual Activity  Alcohol Use Not Currently   Comment: a 5th of liquor per day     Social History   Substance and Sexual Activity  Drug Use Yes   Frequency: 1.0 times per week   Types: Marijuana   Comment: Gives you chest pain - trying to quit    Social History   Socioeconomic History   Marital status: Married    Spouse name: Not on file   Number of children: Not on file   Years of education: Not on file   Highest education level: Not on file  Occupational History   Not on file  Tobacco Use   Smoking status: Never   Smokeless tobacco: Never  Vaping Use   Vaping Use: Never used  Substance and Sexual Activity   Alcohol use: Not Currently    Comment: a 5th of liquor per day   Drug use: Yes    Frequency: 1.0 times per week    Types: Marijuana    Comment: Gives you chest pain - trying to quit   Sexual activity: Not Currently    Birth control/protection: Surgical  Other Topics Concern   Not on file  Social History Narrative   Not on file   Social Determinants of Health   Financial Resource Strain: Not on file  Food Insecurity: Not on file  Transportation Needs: Not on file  Physical Activity: Not on file  Stress: Not on file  Social Connections: Not on file   Additional Social History:    Allergies:  No Known Allergies  Labs:  Results for orders placed or performed during the hospital encounter of 05/22/21 (from the past 48 hour(s))  Magnesium     Status: Abnormal    Collection Time: 05/27/21  2:06 AM  Result Value Ref Range   Magnesium 1.5 (L) 1.7 - 2.4 mg/dL    Comment: Performed at [facility], [address] [place], [place] [address]  Comprehensive metabolic panel     Status: Abnormal   Collection Time: 05/27/21  2:06 AM  Result Value Ref Range   Sodium 140 135 - 145 mmol/L   Potassium 4.1 3.5 - 5.1 mmol/L   Chloride 110 98 - 111 mmol/L   CO2 24 22 - 32 mmol/L   Glucose, Bld 81 70 - 99 mg/dL    Comment: Glucose reference range applies only to samples taken  after fasting for at least 8 hours.   BUN <5 (L) 6 - 20 mg/dL   Creatinine, Ser 6.96 0.44 - 1.00 mg/dL   Calcium 8.2 (L) 8.9 - 10.3 mg/dL   Total Protein 6.3 (L) 6.5 - 8.1 g/dL   Albumin 2.9 (L) 3.5 - 5.0 g/dL   AST 295 (H) 15 - 41 U/L   ALT 31 0 - 44 U/L   Alkaline Phosphatase 71 38 - 126 U/L   Total Bilirubin 1.7 (H) 0.3 - 1.2 mg/dL   GFR, Estimated >28 >41 mL/min    Comment: (NOTE) Calculated using the CKD-EPI Creatinine Equation (2021)    Anion gap 6 5 - 15    Comment: Performed at [facility], [address], [place], [place] [address]  Brain natriuretic peptide     Status: Abnormal   Collection Time: 05/27/21  2:06 AM  Result Value Ref Range   B Natriuretic Peptide 123.8 (H) 0.0 - 100.0 pg/mL    Comment: Performed at [facility], [address] [place], [place] [address]  CBC with Differential/Platelet     Status: Abnormal   Collection Time: 05/27/21  2:06 AM  Result Value Ref Range   WBC 3.3 (L) 4.0 - 10.5 K/uL   RBC 2.82 (L) 3.87 - 5.11 MIL/uL   Hemoglobin 8.8 (L) 12.0 - 15.0 g/dL   HCT 53.6 (L) 64.4 - 03.4 %   MCV 96.8 80.0 - 100.0 fL   MCH 31.2 26.0 - 34.0 pg   MCHC 32.2 30.0 - 36.0 g/dL   RDW 74.2 (H) 59.5 - 63.8 %   Platelets 145 (L) 150 - 400 K/uL   nRBC 0.0 0.0 - 0.2 %   Neutrophils Relative % 53 %   Neutro Abs 1.7 1.7 - 7.7 K/uL   Lymphocytes Relative 32 %   Lymphs Abs 1.0 0.7 - 4.0 K/uL   Monocytes Relative 10 %   Monocytes  Absolute 0.3 0.1 - 1.0 K/uL   Eosinophils Relative 3 %   Eosinophils Absolute 0.1 0.0 - 0.5 K/uL   Basophils Relative 2 %   Basophils Absolute 0.1 0.0 - 0.1 K/uL   Immature Granulocytes 0 %   Abs Immature Granulocytes 0.01 0.00 - 0.07 K/uL    Comment: Performed at [facility], [address] [place], [place] [address]  Phosphorus     Status: Abnormal   Collection Time: 05/27/21  2:06 AM  Result Value Ref Range   Phosphorus 2.4 (L) 2.5 - 4.6 mg/dL    Comment: Performed at [facility], [address], [place], [place] [address]  Lipase, blood     Status: Abnormal   Collection Time: 05/27/21  2:06 AM  Result Value Ref Range   Lipase 81 (H) 11 - 51 U/L    Comment: Performed at [facility], [address], [place], [place] [address]  Magnesium     Status: Abnormal   Collection Time: 05/28/21  2:37 AM  Result Value Ref Range   Magnesium 1.4 (L) 1.7 - 2.4 mg/dL    Comment: Performed at [facility], [address] [place], [place] [address]  Comprehensive metabolic panel     Status: Abnormal   Collection Time: 05/28/21  2:37 AM  Result Value Ref Range   Sodium 139 135 - 145 mmol/L   Potassium 4.0 3.5 - 5.1 mmol/L   Chloride 111 98 - 111 mmol/L   CO2 23 22 - 32  mmol/L   Glucose, Bld 97 70 - 99 mg/dL    Comment: Glucose reference range applies only to samples taken after fasting for at least 8 hours.   BUN <5 (L) 6 - 20 mg/dL   Creatinine, Ser 8.46 0.44 - 1.00 mg/dL   Calcium 8.1 (L) 8.9 - 10.3 mg/dL   Total Protein 5.6 (L) 6.5 - 8.1 g/dL   Albumin 2.5 (L) 3.5 - 5.0 g/dL   AST 962 (H) 15 - 41 U/L   ALT 28 0 - 44 U/L   Alkaline Phosphatase 63 38 - 126 U/L   Total Bilirubin 1.5 (H) 0.3 - 1.2 mg/dL   GFR, Estimated >95 >28 mL/min    Comment: (NOTE) Calculated using the CKD-EPI Creatinine Equation (2021)    Anion gap 5 5 - 15    Comment: Performed at [facility], [address], [place], [place] [address]  Brain natriuretic peptide     Status:  None   Collection Time: 05/28/21  2:37 AM  Result Value Ref Range   B Natriuretic Peptide 89.9 0.0 - 100.0 pg/mL    Comment: Performed at [facility], [address], [place], [place] [address]  CBC with Differential/Platelet     Status: Abnormal   Collection Time: 05/28/21  2:37 AM  Result Value Ref Range   WBC 2.5 (L) 4.0 - 10.5 K/uL   RBC 2.55 (L) 3.87 - 5.11 MIL/uL   Hemoglobin 8.0 (L) 12.0 - 15.0 g/dL   HCT 72.5 (L) 36.6 - 44.0 %   MCV 96.1 80.0 - 100.0 fL   MCH 31.4 26.0 - 34.0 pg   MCHC 32.7 30.0 - 36.0 g/dL   RDW 34.7 (H) 42.5 - 95.6 %   Platelets 116 (L) 150 - 400 K/uL    Comment: Immature Platelet Fraction may be clinically indicated, consider ordering this additional test [id number] REPEATED TO VERIFY    nRBC 0.0 0.0 - 0.2 %   Neutrophils Relative % 50 %   Neutro Abs 1.2 (L) 1.7 - 7.7 K/uL   Lymphocytes Relative 30 %   Lymphs Abs 0.8 0.7 - 4.0 K/uL   Monocytes Relative 16 %   Monocytes Absolute 0.4 0.1 - 1.0 K/uL   Eosinophils Relative 2 %   Eosinophils Absolute 0.1 0.0 - 0.5 K/uL   Basophils Relative 1 %   Basophils Absolute 0.0 0.0 - 0.1 K/uL   Immature Granulocytes 1 %   Abs Immature Granulocytes 0.02 0.00 - 0.07 K/uL    Comment: Performed at [facility], [address] [place], [place] [address]  Phosphorus     Status: None   Collection Time: 05/28/21  2:37 AM  Result Value Ref Range   Phosphorus 2.7 2.5 - 4.6 mg/dL    Comment: Performed at [facility], [address], [place], [place] [address]    Current Facility-Administered Medications  Medication Dose Route Frequency Provider Last Rate Last Admin   acetaminophen (TYLENOL) tablet 650 mg  650 mg Oral Q6H PRN Howerter, Justin B, DO   650 mg at 05/26/21 1050   Or   acetaminophen (TYLENOL) suppository 650 mg  650 mg Rectal Q6H PRN Howerter, Justin B, DO       albuterol (PROVENTIL) (2.5 MG/3ML) 0.083% nebulizer solution 2.5 mg  2.5 mg Nebulization Q4H PRN Ghimire, Werner Lean, MD        alum & mag hydroxide-simeth (MAALOX/MYLANTA) 200-200-20 MG/5ML suspension 30 mL  30 mL Oral Q6H  PRN Maretta Bees, MD       amLODipine (NORVASC) tablet 10 mg  10 mg Oral Daily Howerter, Justin B, DO   10 mg at 05/28/21 0926   dextrose 5 % and 0.45 % NaCl with KCl 10 mEq/L infusion   Intravenous Continuous Leroy Sea, MD 75 mL/hr at 05/28/21 0932 New Bag at 05/28/21 0932   enoxaparin (LOVENOX) injection 40 mg  40 mg Subcutaneous Q24H Leroy Sea, MD   40 mg at 05/28/21 1155   HYDROcodone-acetaminophen (NORCO/VICODIN) 5-325 MG per tablet 1 tablet  1 tablet Oral Q4H PRN Leroy Sea, MD   1 tablet at 05/28/21 1008   metoprolol tartrate (LOPRESSOR) injection 5 mg  5 mg Intravenous Q4H PRN Ghimire, Werner Lean, MD       metoprolol tartrate (LOPRESSOR) tablet 50 mg  50 mg Oral BID Maretta Bees, MD   50 mg at 05/28/21 1008   mirtazapine (REMERON) tablet 15 mg  15 mg Oral QHS Massengill, Harrold Donath, MD   15 mg at 05/27/21 2129   morphine (PF) 2 MG/ML injection 2 mg  2 mg Intravenous Q3H PRN Leroy Sea, MD       naloxone [facility]) injection 0.4 mg  0.4 mg Intravenous PRN Howerter, Justin B, DO       ondansetron (ZOFRAN) injection 4 mg  4 mg Intravenous Q6H PRN Howerter, Justin B, DO   4 mg at 05/27/21 1851   pantoprazole (PROTONIX) injection 40 mg  40 mg Intravenous Q12H Maretta Bees, MD   40 mg at 05/28/21 0926   sucralfate (CARAFATE) 1 GM/10ML suspension 1 g  1 g Oral TID WC & HS Ghimire, Werner Lean, MD   1 g at 05/28/21 1155              Psychiatric Specialty Exam:  Presentation  General Appearance: Appropriate for Environment; Casual  Eye Contact:Good  Speech:Clear and Coherent; Normal Rate  Speech Volume:Normal  Handedness:Right  Mood and Affect  Mood:Anxious  Affect:Appropriate; Congruent   Thought Process  Thought Processes:Linear; Coherent; Goal Directed  Descriptions of Associations:Intact  Orientation:Full (Time, Place and  Person) Thought Content:Logical  History of Schizophrenia/Schizoaffective disorder:No data recorded Duration of Psychotic Symptoms:No data recorded Hallucinations:Hallucinations: None  Ideas of Reference:None  Suicidal Thoughts:Suicidal Thoughts: No  Homicidal Thoughts:Homicidal Thoughts: No   Sensorium  Memory:Immediate Good; Recent Good; Remote Good  Judgment:Fair  Insight:Fair   Executive Functions  Concentration:Good  Attention Span:Good  Recall:Good Fund of Knowledge:Good Language:Good  Psychomotor Activity  Psychomotor Activity:Psychomotor Activity: Normal  Assets  Assets:Communication Skills; Desire for Improvement; Financial Resources/Insurance; Housing; Social Support; Resilience; Leisure Time  Sleep  Sleep:Sleep: Fair   Physical Exam: Physical Exam Vitals reviewed.   Review of Systems  Psychiatric/Behavioral:  Positive for depression, substance abuse and suicidal ideas. The patient is nervous/anxious and has insomnia.    Blood pressure 112/67, pulse 83, temperature 99.2 F (37.3 C), temperature source Oral, resp. rate 18, height  (1.6 m), weight 62 kg, SpO2 99 %. Body mass index is 24.2 kg/m.  Treatment Plan Summary: Daily contact with patient to assess and evaluate symptoms and progress in treatment  Assessment: MDD, severe, recurrent, without psychosis GAD Rule out panic disorder PTSD Alcohol use disorder, severe  Plan: -Patient requires psychiatric admission to inpatient psychiatric unit, once medically cleared.  Consult social work when medically clear, to coordinate admission to inpatient psychiatric unit. -May discontinue suicide sitter, as patient is no longer a danger to herself.  She does not appear to be at imminent risk to herself or others at this time. -Continue Remeron 15 mg nightly for MDD, GAD, insomnia, loss of appetite  Psychiatric team will follow and reassess patient every 48 hours, patient is not medically stable  at this time.  As per attending will likely be medically stable on Thursday or Friday.  Disposition: Recommend psychiatric Inpatient admission when medically cleared.  Maryagnes Amos, FNP 05/28/2021 2:09 PM  Total Time Spent in Direct Patient Care:  I personally spent 30 minutes on the unit in direct patient care. The direct patient care time included face-to-face time with the patient, reviewing the patient's chart, communicating with other professionals, and coordinating care. Greater than 50% of this time was spent in counseling or coordinating care with the patient regarding goals of hospitalization, psycho-education, and discharge planning needs.

## 2021-05-29 DIAGNOSIS — R1013 Epigastric pain: Secondary | ICD-10-CM | POA: Diagnosis not present

## 2021-05-29 LAB — CBC WITH DIFFERENTIAL/PLATELET
Abs Immature Granulocytes: 0 10*3/uL (ref 0.00–0.07)
Basophils Absolute: 0 10*3/uL (ref 0.0–0.1)
Basophils Relative: 1 %
Eosinophils Absolute: 0.1 10*3/uL (ref 0.0–0.5)
Eosinophils Relative: 2 %
HCT: 25.2 % — ABNORMAL LOW (ref 36.0–46.0)
Hemoglobin: 8.1 g/dL — ABNORMAL LOW (ref 12.0–15.0)
Immature Granulocytes: 0 %
Lymphocytes Relative: 31 %
Lymphs Abs: 0.9 10*3/uL (ref 0.7–4.0)
MCH: 31.5 pg (ref 26.0–34.0)
MCHC: 32.1 g/dL (ref 30.0–36.0)
MCV: 98.1 fL (ref 80.0–100.0)
Monocytes Absolute: 0.5 10*3/uL (ref 0.1–1.0)
Monocytes Relative: 16 %
Neutro Abs: 1.5 10*3/uL — ABNORMAL LOW (ref 1.7–7.7)
Neutrophils Relative %: 50 %
Platelets: 113 10*3/uL — ABNORMAL LOW (ref 150–400)
RBC: 2.57 MIL/uL — ABNORMAL LOW (ref 3.87–5.11)
RDW: 20.6 % — ABNORMAL HIGH (ref 11.5–15.5)
WBC: 3 10*3/uL — ABNORMAL LOW (ref 4.0–10.5)
nRBC: 0 % (ref 0.0–0.2)

## 2021-05-29 LAB — COMPREHENSIVE METABOLIC PANEL
ALT: 25 U/L (ref 0–44)
AST: 86 U/L — ABNORMAL HIGH (ref 15–41)
Albumin: 2.6 g/dL — ABNORMAL LOW (ref 3.5–5.0)
Alkaline Phosphatase: 64 U/L (ref 38–126)
Anion gap: 5 (ref 5–15)
BUN: <5 mg/dL — ABNORMAL LOW (ref 6–20)
CO2: 22 mmol/L (ref 22–32)
Calcium: 8.3 mg/dL — ABNORMAL LOW (ref 8.9–10.3)
Chloride: 111 mmol/L (ref 98–111)
Creatinine, Ser: 0.63 mg/dL (ref 0.44–1.00)
GFR, Estimated: >60 mL/min (ref >60)
Glucose, Bld: 119 mg/dL — ABNORMAL HIGH (ref 70–99)
Potassium: 3.7 mmol/L (ref 3.5–5.1)
Sodium: 138 mmol/L (ref 135–145)
Total Bilirubin: 1.1 mg/dL (ref 0.3–1.2)
Total Protein: 5.5 g/dL — ABNORMAL LOW (ref 6.5–8.1)

## 2021-05-29 LAB — LIPASE, BLOOD: Lipase: 145 U/L — ABNORMAL HIGH (ref 11–51)

## 2021-05-29 LAB — PHOSPHORUS: Phosphorus: 2.2 mg/dL — ABNORMAL LOW (ref 2.5–4.6)

## 2021-05-29 LAB — MAGNESIUM: Magnesium: 1.5 mg/dL — ABNORMAL LOW (ref 1.7–2.4)

## 2021-05-29 MED ORDER — MAGNESIUM SULFATE 2 GM/50ML IV SOLN: 2.0000 g | Freq: Once | INTRAVENOUS | Status: DC

## 2021-05-29 MED ORDER — SODIUM PHOSPHATES 45 MMOLE/15ML IV SOLN
30.0000 mmol | Freq: Once | INTRAVENOUS | Status: AC
  Administered 2021-05-29: 30 mmol via INTRAVENOUS
  Filled 2021-05-29: qty 10

## 2021-05-29 MED ORDER — MAGNESIUM OXIDE -MG SUPPLEMENT 400 (240 MG) MG PO TABS
800.0000 mg | ORAL_TABLET | Freq: Two times a day (BID) | ORAL | Status: DC
  Administered 2021-05-29 – 2021-06-01 (×8): 800 mg via ORAL
  Filled 2021-05-29 (×8): qty 2

## 2021-05-29 MED ORDER — MAGNESIUM SULFATE 4 GM/100ML IV SOLN
4.0000 g | Freq: Once | INTRAVENOUS | Status: AC
  Administered 2021-05-29: 4 g via INTRAVENOUS
  Filled 2021-05-29: qty 100

## 2021-05-29 MED ORDER — DEXTROSE IN LACTATED RINGERS 5 % IV SOLN: INTRAVENOUS | Status: AC

## 2021-05-29 MED ORDER — ADULT MULTIVITAMIN W/MINERALS CH
1.0000 | ORAL_TABLET | Freq: Every day | ORAL | Status: DC
  Administered 2021-05-29 – 2021-06-02 (×5): 1 via ORAL
  Filled 2021-05-29 (×5): qty 1

## 2021-05-29 NOTE — Progress Notes (Signed)
Initial Nutrition Assessment  DOCUMENTATION CODES:   Not applicable  INTERVENTION:   Recommend placing Cortrak (post-pyloric) and starting enteral feeds due to inadequate nutrition for >7 days. Reached out to MD. If tube is placed, start tube feeds:  Start Osmolite 1.5 @ 20 mL/hr and advance by 10 mL/hr q8h until goal rate of 50 mL/hr is reached (1200 mL/day) 45 mL ProSource TF - BID 150 mL free water flush q4h  Provides 1880 kcal, 97 gm of protein, and 914 mL free water (1814 total mL) daily.  Multivitamin w/ minerals daily  NUTRITION DIAGNOSIS:   Inadequate oral intake related to inability to eat as evidenced by NPO status.  GOAL:   Patient will meet greater than or equal to 90% of their needs   MONITOR:   Diet advancement, Labs, Weight trends  REASON FOR ASSESSMENT:   NPO/Clear Liquid Diet    ASSESSMENT:   51 y.o. female presented to the ED with epigastric pain. PMH includes chronic EtOH abuse, GERD, IBS, CHF, HTN, and dysphagia. Pt admitted with epigastric pain likely due to acute alcohol pancreatitis.   Pt resting in bed at time of RD visit, pt tearful and reporting that she is having a bad day.  Pt reports that her appetite was very poor PTA and was eating little. Per H&P, pt drinks a 1/5 of liquor daily at home.  Discussed with pt how she tolerated being on a clear liquid diet. Pt reports that she tolerated the clears just fine and was able to drink an Ensure and keep it down, but had nausea with that.   Pt endorses a 35# weight loss since March, unable to provide UBW. Per EMR, pt has had a 7% weight loss within 5 months, this is not clinically significant for time frame.   Discussed with pt that if unable to advance and tolerate diet, will need to start alternative means of nutrition, including the placement of a temporary feeding tube.  Medications reviewed and include: Remeron, Protonix, Sucralfate, IV magnesium sulfate, IV sodium phosphate Labs reviewed: BUN  <5, Phosphorus 2.2, Magnesium 1.5   NUTRITION - FOCUSED PHYSICAL EXAM:  Deferred to follow-up.   Diet Order:   Diet Order             Diet NPO time specified Except for: Sips with Meds  Diet effective now                   EDUCATION NEEDS:   No education needs have been identified at this time  Skin:  Skin Assessment: Reviewed RN Assessment  Last BM:  5/21  Height:   Ht Readings from Last 1 Encounters:  05/23/21 5\' 3"  (1.6 m)   Weight:   Wt Readings from Last 1 Encounters:  05/27/21 62 kg   Ideal Body Weight:  52.3 kg  BMI:  Body mass index is 24.2 kg/m.  Estimated Nutritional Needs:  Kcal:  1800-2000 Protein:  90-105 grams Fluid:  >/= 1.8 L    Hermina Barters RD, LDN Clinical Dietitian See [facility] for contact information.

## 2021-05-29 NOTE — Progress Notes (Addendum)
PROGRESS NOTE        PATIENT DETAILS Name: Nicole Hughes Age: 51 y.o. Sex: female Date of Birth: July 25, 1970 Admit Date: 05/22/2021 Admitting Physician Rhetta Mura, DO PCP:Pcp, No  Brief Summary: Patient is a 51 y.o.  female with history of EtOH use, GERD, HTN-who presented to the ED with epigastric pain-she was thought to have pancreatitis-alcohol withdrawal-and subsequently admitted to the hospitalist service.   Significant events: 5/17>> admit to [facility]-epigastric pain-EtOH withdrawal  Significant studies: 5/17>> CXR: No PNA 5/17>> CT abdomen/pelvis: No acute abnormality. 5/17>> lipase: 141  Significant microbiology data: 5/17>> COVID/influenza PCR: Negative  Procedures: None  Consults: Psych  Subjective:  Patient in bed, appears comfortable, denies any headache, no fever, no chest pain or pressure, no shortness of breath , improved abdominal pain. No new focal weakness.   Objective: Vitals: Blood pressure (!) 141/79, pulse 91, temperature 98.8 F (37.1 C), temperature source Oral, resp. rate 20, height 5\' 3"  (1.6 m), weight 62 kg, SpO2 100 %.   Exam:  Awake Alert, No new F.N deficits, Normal affect [place].AT,PERRAL Supple Neck, No JVD,   Symmetrical Chest wall movement, Good air movement bilaterally, CTAB RRR,No Gallops, Rubs or new Murmurs,  +ve B.Sounds, Abd Soft, No tenderness,   No Cyanosis, Clubbing or edema    Assessment/Plan:  Epigastric pain-likely due to acute alcoholic pancreatitis: Diagnosed clinically with epigastric pain, alcohol abuse and elevated lipase, CT scan of the abdomen pelvis was reassuring, strictly counseled to quit alcohol, pain had improved but with diet challenge pain got worse on 05/28/2021, strict bowel rest, IV fluids and supportive care.  Exam is benign hemodynamically stable.  Pain has improved on 05/29/2021 we will cautiously give another day of bowel rest.  Alcoholic hepatitis: Mild-supportive  care.  EtOH withdrawal: In early DTs, improving with CIWA protocol.  Strictly counseled to quit alcohol.  HTN: BP relatively stable-continue metoprolol and amlodipine.  Hypokalemia, hypomagnesemia and hypophosphatemia: Repleted and recheck  Normocytic anemia: Chronic issue-watch closely.  Transfuse if Hb <7.  Probably due to bone marrow suppression from alcohol use.  Suicidal ideation night of 05/25/2021.  Psych called, continue suicidal precautions, per psych [facility] admission once medically cleared, per Psych ok to DC sitter.  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  Underweight: Estimated body mass index is 24.2 kg/m as calculated from the following:   Height as of this encounter: 5\' 3"  (1.6 m).   Weight as of this encounter: 62 kg.   Code status:   Code Status: Full Code   DVT Prophylaxis: Start Lovenox on 05/27/2021. enoxaparin (LOVENOX) injection 40 mg Start: 05/27/21 1145 SCDs Start: 05/23/21 0017   Family Communication: None at bedside   Disposition Plan: Post discharge to inpatient psych facility.   Diet: Diet Order             Diet NPO time specified Except for: Sips with Meds  Diet effective now                     Antimicrobial agents: Anti-infectives (From admission, onward)    None        MEDICATIONS: Scheduled Meds:  amLODipine  10 mg Oral Daily   enoxaparin (LOVENOX) injection  40 mg Subcutaneous Q24H   metoprolol tartrate  50 mg Oral BID   mirtazapine  15 mg Oral QHS   pantoprazole (  PROTONIX) IV  40 mg Intravenous Q12H   sucralfate  1 g Oral TID WC & HS   Continuous Infusions:  dextrose 5% lactated ringers     magnesium sulfate bolus IVPB     sodium phosphate  Dextrose 5% IVPB     PRN Meds:.acetaminophen **OR** acetaminophen, albuterol, alum & mag hydroxide-simeth, HYDROcodone-acetaminophen, metoprolol tartrate, morphine injection, naLOXone (NARCAN)  injection, ondansetron (ZOFRAN)  IV   I have personally reviewed following labs and imaging studies  LABORATORY DATA:  Recent Labs  Lab 05/23/21 0237 05/24/21 0213 05/25/21 0053 05/26/21 0139 05/27/21 0206 05/28/21 0237 05/29/21 0120  WBC 4.5   < > 3.5* 2.8* 3.3* 2.5* 3.0*  HGB 8.7*   < > 8.3* 7.8* 8.8* 8.0* 8.1*  HCT 27.3*   < > 24.9* 24.4* 27.3* 24.5* 25.2*  PLT 99*   < > 113* 117* 145* 116* 113*  MCV 94.5   < > 95.0 96.1 96.8 96.1 98.1  MCH 30.1   < > 31.7 30.7 31.2 31.4 31.5  MCHC 31.9   < > 33.3 32.0 32.2 32.7 32.1  RDW 24.4*   < > 22.7* 22.5* 22.2* 21.5* 20.6*  LYMPHSABS 1.1  --   --  0.8 1.0 0.8 0.9  MONOABS 0.5  --   --  0.3 0.3 0.4 0.5  EOSABS 0.1  --   --  0.1 0.1 0.1 0.1  BASOSABS 0.1  --   --  0.0 0.1 0.0 0.0   < > = values in this interval not displayed.    Recent Labs  Lab 05/23/21 0237 05/23/21 1418 05/24/21 0213 05/25/21 0053 05/26/21 0139 05/27/21 0206 05/28/21 0237 05/29/21 0120  NA 137  --    < > 137 140 140 139 138  K 4.3  --    < > 3.7 3.3* 4.1 4.0 3.7  CL 107  --    < > 107 112* 110 111 111  CO2 18*  --    < > 21* 22 24 23 22   GLUCOSE 68*  --    < > 84 110* 81 97 119*  BUN 11  --    < > <5* <5* <5* <5* <5*  CREATININE 0.72  --    < > 0.63 0.54 0.57 0.63 0.63  CALCIUM 7.8*  --    < > 7.5* 7.4* 8.2* 8.1* 8.3*  AST 198*  --    < > 154* 131* 118* 101* 86*  ALT 29  --    < > 28 28 31 28 25   ALKPHOS 81  --    < > 72 69 71 63 64  BILITOT 2.4*  --    < > 1.8* 1.8* 1.7* 1.5* 1.1  ALBUMIN 3.0*  --    < > 2.9* 2.7* 2.9* 2.5* 2.6*  MG 1.2*  --    < > 1.7 1.2* 1.5* 1.4* 1.5*  PHOS 2.6  --    < > 1.9* 2.4* 2.4* 2.7 2.2*  INR 1.2  --   --   --   --   --   --   --   AMMONIA  --  61*  --   --   --   --   --   --   BNP  --   --   --   --  95.6 123.8* 89.9  --    < > = values in this interval not displayed.    RADIOLOGY STUDIES/RESULTS: No results found.  LOS: 6 days   Signature  Lala Lund M.D on 05/29/2021 at 11:06 AM   -  To page go to [email or web address]

## 2021-05-29 NOTE — Progress Notes (Signed)
IVT consulted to assess two PIV's.  Upon assessment, both PIV's have GBR.  Pt did not react when I flushed LAF, but stated the RLF PIV stung.  IVT pulled back more blood and flushed again w/no complaint.  Findings provided to primary RN, Devin.

## 2021-05-30 DIAGNOSIS — R1013 Epigastric pain: Secondary | ICD-10-CM | POA: Diagnosis not present

## 2021-05-30 LAB — COMPREHENSIVE METABOLIC PANEL
ALT: 23 U/L (ref 0–44)
AST: 82 U/L — ABNORMAL HIGH (ref 15–41)
Albumin: 2.8 g/dL — ABNORMAL LOW (ref 3.5–5.0)
Alkaline Phosphatase: 62 U/L (ref 38–126)
Anion gap: 8 (ref 5–15)
BUN: <5 mg/dL — ABNORMAL LOW (ref 6–20)
CO2: 22 mmol/L (ref 22–32)
Calcium: 8.4 mg/dL — ABNORMAL LOW (ref 8.9–10.3)
Chloride: 108 mmol/L (ref 98–111)
Creatinine, Ser: 0.43 mg/dL — ABNORMAL LOW (ref 0.44–1.00)
GFR, Estimated: >60 mL/min (ref >60)
Glucose, Bld: 81 mg/dL (ref 70–99)
Potassium: 3.1 mmol/L — ABNORMAL LOW (ref 3.5–5.1)
Sodium: 138 mmol/L (ref 135–145)
Total Bilirubin: 1.8 mg/dL — ABNORMAL HIGH (ref 0.3–1.2)
Total Protein: 5.8 g/dL — ABNORMAL LOW (ref 6.5–8.1)

## 2021-05-30 LAB — CBC WITH DIFFERENTIAL/PLATELET
Abs Immature Granulocytes: 0 10*3/uL (ref 0.00–0.07)
Basophils Absolute: 0 10*3/uL (ref 0.0–0.1)
Basophils Relative: 1 %
Eosinophils Absolute: 0.1 10*3/uL (ref 0.0–0.5)
Eosinophils Relative: 3 %
HCT: 26.8 % — ABNORMAL LOW (ref 36.0–46.0)
Hemoglobin: 8.4 g/dL — ABNORMAL LOW (ref 12.0–15.0)
Immature Granulocytes: 0 %
Lymphocytes Relative: 37 %
Lymphs Abs: 1.2 10*3/uL (ref 0.7–4.0)
MCH: 30.4 pg (ref 26.0–34.0)
MCHC: 31.3 g/dL (ref 30.0–36.0)
MCV: 97.1 fL (ref 80.0–100.0)
Monocytes Absolute: 0.5 10*3/uL (ref 0.1–1.0)
Monocytes Relative: 16 %
Neutro Abs: 1.4 10*3/uL — ABNORMAL LOW (ref 1.7–7.7)
Neutrophils Relative %: 43 %
Platelets: 133 10*3/uL — ABNORMAL LOW (ref 150–400)
RBC: 2.76 MIL/uL — ABNORMAL LOW (ref 3.87–5.11)
RDW: 20.2 % — ABNORMAL HIGH (ref 11.5–15.5)
WBC: 3.2 10*3/uL — ABNORMAL LOW (ref 4.0–10.5)
nRBC: 0 % (ref 0.0–0.2)

## 2021-05-30 LAB — MAGNESIUM: Magnesium: 1.6 mg/dL — ABNORMAL LOW (ref 1.7–2.4)

## 2021-05-30 LAB — PHOSPHORUS: Phosphorus: 5 mg/dL — ABNORMAL HIGH (ref 2.5–4.6)

## 2021-05-30 LAB — LIPASE, BLOOD: Lipase: 104 U/L — ABNORMAL HIGH (ref 11–51)

## 2021-05-30 MED ORDER — POTASSIUM CHLORIDE CRYS ER 20 MEQ PO TBCR
40.0000 meq | EXTENDED_RELEASE_TABLET | Freq: Once | ORAL | Status: AC
  Administered 2021-05-30: 40 meq via ORAL
  Filled 2021-05-30: qty 2

## 2021-05-30 MED ORDER — PROSOURCE PLUS PO LIQD
30.0000 mL | Freq: Two times a day (BID) | ORAL | Status: DC
  Administered 2021-05-30 – 2021-06-01 (×2): 30 mL via ORAL
  Filled 2021-05-30 (×2): qty 30

## 2021-05-30 MED ORDER — MAGNESIUM SULFATE 2 GM/50ML IV SOLN
2.0000 g | Freq: Once | INTRAVENOUS | Status: AC
  Administered 2021-05-30: 2 g via INTRAVENOUS
  Filled 2021-05-30: qty 50

## 2021-05-30 MED ORDER — MELATONIN 5 MG PO TABS
10.0000 mg | ORAL_TABLET | Freq: Every day | ORAL | Status: DC
  Administered 2021-05-30 – 2021-06-01 (×3): 10 mg via ORAL
  Filled 2021-05-30 (×3): qty 2

## 2021-05-30 NOTE — Progress Notes (Signed)
PROGRESS NOTE        PATIENT DETAILS Name: Nicole Hughes Age: 51 y.o. Sex: female Date of Birth: 1970/11/28 Admit Date: 05/22/2021 Admitting Physician Angie Fava, DO PCP:Pcp, No  Brief Summary: Patient is a 51 y.o.  female with history of EtOH use, GERD, HTN-who presented to the ED with epigastric pain-she was thought to have pancreatitis-alcohol withdrawal-and subsequently admitted to the hospitalist service.   Significant events: 5/17>> admit to [facility]-epigastric pain-EtOH withdrawal  Significant studies: 5/17>> CXR: No PNA 5/17>> CT abdomen/pelvis: No acute abnormality. 5/17>> lipase: 141  Significant microbiology data: 5/17>> COVID/influenza PCR: Negative  Procedures: None  Consults: Psych  Subjective: Seen in bed appears comfortable, no chest pain, no shortness of breath, abdominal pain has improved.   Objective: Vitals: Blood pressure 131/67, pulse 77, temperature 98.5 F (36.9 C), temperature source Oral, resp. rate 20, height 5\' 3"  (1.6 m), weight 62 kg, SpO2 98 %.   Exam:  Awake Alert, No new F.N deficits, Normal affect [place].AT,PERRAL Supple Neck, No JVD,   Symmetrical Chest wall movement, Good air movement bilaterally, CTAB RRR,No Gallops, Rubs or new Murmurs,  +ve B.Sounds, Abd Soft, No tenderness,   No Cyanosis, Clubbing or edema     Assessment/Plan:  Epigastric pain-likely due to acute alcoholic pancreatitis: Diagnosed clinically with epigastric pain, alcohol abuse and elevated lipase, CT scan of the abdomen pelvis was reassuring, strictly counseled to quit alcohol, pain had improved but with diet challenge pain got worse on 05/28/2021, she was placed again on strict bowel rest on 05/28/2021, IV fluids and supportive care.  Exam is benign hemodynamically stable.  Pain has continued to improve on 05/30/2021 and she wants to try clear liquids which will be added on 05/30/2021 along with protein supplementation by mouth.  If  pain reoccurs then most likely will place PICC line with TNA.  Alcoholic hepatitis: Mild-supportive care.  EtOH withdrawal: In early DTs, improving with CIWA protocol.  Strictly counseled to quit alcohol.  HTN: BP relatively stable-continue metoprolol and amlodipine.  Hypokalemia, hypomagnesemia and hypophosphatemia: Repleted again and recheck  Normocytic anemia: Chronic issue-watch closely.  Transfuse if Hb <7.  Probably due to bone marrow suppression from alcohol use.  Suicidal ideation night of 05/25/2021.  Psych called, continue suicidal precautions, per psych [facility] admission once medically cleared, per Psych ok to DC sitter.  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  Underweight: Estimated body mass index is 24.2 kg/m as calculated from the following:   Height as of this encounter: 5\' 3"  (1.6 m).   Weight as of this encounter: 62 kg.   Code status:   Code Status: Full Code   DVT Prophylaxis: Start Lovenox on 05/27/2021. enoxaparin (LOVENOX) injection 40 mg Start: 05/27/21 1145 SCDs Start: 05/23/21 0017   Family Communication: None at bedside   Disposition Plan: Post discharge to inpatient psych facility.   Diet: Diet Order             Diet clear liquid Room service appropriate? Yes; Fluid consistency: Thin  Diet effective now                     Antimicrobial agents: Anti-infectives (From admission, onward)    None        MEDICATIONS: Scheduled Meds:  (feeding supplement) PROSource Plus  30 mL Oral BID BM   amLODipine  10 mg Oral Daily   enoxaparin (LOVENOX) injection  40 mg Subcutaneous Q24H   magnesium oxide  800 mg Oral BID   metoprolol tartrate  50 mg Oral BID   mirtazapine  15 mg Oral QHS   multivitamin with minerals  1 tablet Oral Daily   pantoprazole (PROTONIX) IV  40 mg Intravenous Q12H   potassium chloride  40 mEq Oral Once   sucralfate  1 g Oral TID WC & HS   Continuous Infusions:   dextrose 5% lactated ringers 75 mL/hr at 05/29/21 1239   magnesium sulfate bolus IVPB     PRN Meds:.acetaminophen **OR** acetaminophen, albuterol, alum & mag hydroxide-simeth, HYDROcodone-acetaminophen, metoprolol tartrate, morphine injection, naLOXone (NARCAN)  injection, ondansetron (ZOFRAN) IV   I have personally reviewed following labs and imaging studies  LABORATORY DATA:  Recent Labs  Lab 05/26/21 0139 05/27/21 0206 05/28/21 0237 05/29/21 0120 05/30/21 0410  WBC 2.8* 3.3* 2.5* 3.0* 3.2*  HGB 7.8* 8.8* 8.0* 8.1* 8.4*  HCT 24.4* 27.3* 24.5* 25.2* 26.8*  PLT 117* 145* 116* 113* 133*  MCV 96.1 96.8 96.1 98.1 97.1  MCH 30.7 31.2 31.4 31.5 30.4  MCHC 32.0 32.2 32.7 32.1 31.3  RDW 22.5* 22.2* 21.5* 20.6* 20.2*  LYMPHSABS 0.8 1.0 0.8 0.9 1.2  MONOABS 0.3 0.3 0.4 0.5 0.5  EOSABS 0.1 0.1 0.1 0.1 0.1  BASOSABS 0.0 0.1 0.0 0.0 0.0    Recent Labs  Lab 05/23/21 1418 05/24/21 0213 05/26/21 0139 05/27/21 0206 05/28/21 0237 05/29/21 0120 05/30/21 0410  NA  --    < > 140 140 139 138 138  K  --    < > 3.3* 4.1 4.0 3.7 3.1*  CL  --    < > 112* 110 111 111 108  CO2  --    < > 22 24 23 22 22   GLUCOSE  --    < > 110* 81 97 119* 81  BUN  --    < > <5* <5* <5* <5* <5*  CREATININE  --    < > 0.54 0.57 0.63 0.63 0.43*  CALCIUM  --    < > 7.4* 8.2* 8.1* 8.3* 8.4*  AST  --    < > 131* 118* 101* 86* 82*  ALT  --    < > 28 31 28 25 23   ALKPHOS  --    < > 69 71 63 64 62  BILITOT  --    < > 1.8* 1.7* 1.5* 1.1 1.8*  ALBUMIN  --    < > 2.7* 2.9* 2.5* 2.6* 2.8*  MG  --    < > 1.2* 1.5* 1.4* 1.5* 1.6*  PHOS  --    < > 2.4* 2.4* 2.7 2.2* 5.0*  AMMONIA 61*  --   --   --   --   --   --   BNP  --   --  95.6 123.8* 89.9  --   --    < > = values in this interval not displayed.    RADIOLOGY STUDIES/RESULTS: No results found.   LOS: 7 days   Signature  M.D on 05/30/2021 at 10:15 AM   -  To page go to [email or web address]

## 2021-05-30 NOTE — Consult Note (Addendum)
[facility] Face-to-Face Psychiatry Consult   Reason for Consult:  suicide ideation Referring Physician:  Aileen Fass Patient Identification: Nicole Hughes MRN:  [id number] Principal Diagnosis: Epigastric pain Diagnosis:  Principal Problem:   Epigastric pain Active Problems:   Essential hypertension   Chronic alcohol abuse   Chronic anemia   Alcohol withdrawal ([place])   Transaminitis   Total Time spent with patient: 30 minutes  Subjective:   Nicole Hughes is a 51 y.o. female with medical history significant for chronic alcohol abuse, GERD, essential hypertension, chronic anemia associated baseline hemoglobin 8-11, who is admitted to [facility] on 05/22/2021 with acute epigastric pain after presenting from home to [facility] ED complaining of such.   HPI: On today's reassessment patient is observed to be sitting up in bed, brightens upon approach and greets with a smile.  She describes her mood today as a lot better.  She reports she has been in contact with her family, and they are very supportive.  She continues to endorse ongoing depressive symptoms, however notes her depression has improved.  She describes her depression as insomnia, anxiety, tearfulness.  She currently rates her depression as 3 out of 10 on a Likert scale with 10 being the worst.   "I was just hopeless, very hopeless.  I never wanted to hurt myself.  I just did not have any hope that things would get better.  I do not want to leave my family at all.  But I felt really close.  My depression comes and goes, since my father passed away 2 years ago.  I currently live in his home, so it is a constant reminder of him being gone and I really need some therapy for it.  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  She rates her hopelessness/helplessness at 0 out of 10, with 10 being the worst on a Likert scale.  She now understands her purpose,  as well as services that are available to her for therapy and counseling.  She reports having ongoing difficulty sleeping, despite being started on Remeron almost a week ago. "  I was up until 3 AM this morning.  My mind will not stop racing. "  When assessing for poor sleep hygiene and habits, patient denies any daytime sleepiness, daytime darkness, consistent sleep schedule, excessive caffeine  use. " Further discussion was held with patient abou my mind and will not start racing"; in which she discussed history of possible bipolar disorder diagnosis in 2009 that was subsequently treated with depression medication and Valiums.  She reports during this time her mother was diagnosed with cancer, and she was not taking it well.   She denies any suicidal ideations, suicidal thoughts, and or self-harm.  She is able to contract for safety at this time.   She denies any suicidal thoughts, homicidal thoughts, and or auditory visual hallucinations.  She does not appear to be responding to internal stimuli.  Patient appears to be psychiatrically stable to discharge home, with outpatient referrals and services.  She appears interested in intensive outpatient programming up to 3 days a week for depression, anxiety, and grief.   Past Psychiatric History:  Diagnosis: MDD Denies any previous psychiatric hospitalizations Denies any previous suicide attempts Current psychiatric medications: None Psychiatric medication history: Patient reports taking 8 psychiatric medications, around 2012, but does not remember what any of these  medications were.  Risk to Self: Yes, passive suicidal thoughts without intent or plan Risk to Others: Denies Prior Inpatient Therapy:   Prior Outpatient Therapy:    Past Medical History:  Past Medical History:  Diagnosis Date   Anxiety    Cardiomyopathy ([place])    Chronic lower back pain    Complication of anesthesia    "hard to get under"   Cyst of ovary    Depression    hx   DVT  complicating pregnancy [id number]   RLE; RUE   Dysmenorrhea    Dyspareunia    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/24/2012   GERD (gastroesophageal reflux disease)    Hay fever    "fall and spring" (02/17/2017)   Headache    "2-3 times/.wik" (02/17/2017)   History of chicken pox    Hypertension    Migraine    "monthly" (02/17/2017)   Pelvic pain    Pulmonary embolism ([place]) 08/1998   S/P childbirth    Past Surgical History:  Procedure Laterality Date   [place]  2010   LAPAROSCOPY N/A 09/24/2012   Procedure: OPERATIVE LAPROSCOPY WITH LYSIS OF ADHESIONS;  Surgeon: Thornell Sartorius, MD;  Location: [facility];  Service: Gynecology;  Laterality: N/A;   TONSILLECTOMY  1984   TUBAL LIGATION  2010   Family History:  Family History  Problem Relation Age of Onset   Cancer - Lung Mother    Heart failure Father    Hypertension Father    Family Psychiatric  History:  Mother attempted suicide  Social History:  Social History   Substance and Sexual Activity  Alcohol Use Not Currently   Comment: a 5th of liquor per day     Social History   Substance and Sexual Activity  Drug Use Yes   Frequency: 1.0 times per week   Types: Marijuana   Comment: Gives you chest pain - trying to quit    Social History   Socioeconomic History   Marital status: Married    Spouse name: Not on file   Number of children: Not on file   Years of education: Not on file   Highest education level: Not on file  Occupational History   Not on file  Tobacco Use   Smoking status: Never   Smokeless tobacco: Never  Vaping Use   Vaping Use: Never used  Substance and Sexual Activity   Alcohol use: Not Currently    Comment: a 5th of liquor per day   Drug use: Yes    Frequency: 1.0 times per week    Types: Marijuana    Comment: Gives you chest pain - trying to quit   Sexual activity: Not Currently    Birth control/protection: Surgical  Other Topics Concern   Not on file   Social History Narrative   Not on file   Social Determinants of Health   Financial Resource Strain: Not on file  Food Insecurity: Not on file  Transportation Needs: Not on file  Physical Activity: Not on file  Stress: Not on file  Social Connections: Not on file   Additional Social History:    Allergies:  No Known Allergies  Labs:  Results for orders placed or performed during the hospital encounter of 05/22/21 (from the past 48 hour(s))  Lipase, blood     Status: Abnormal   Collection Time: 05/29/21  1:20 AM  Result Value Ref Range   Lipase 145 (H) 11 - 51 U/L  Comment: Performed at [facility], [place] [address] [place], [place] [address]  CBC with Differential/Platelet     Status: Abnormal   Collection Time: 05/29/21  1:20 AM  Result Value Ref Range   WBC 3.0 (L) 4.0 - 10.5 K/uL   RBC 2.57 (L) 3.87 - 5.11 MIL/uL   Hemoglobin 8.1 (L) 12.0 - 15.0 g/dL   HCT 25.2 (L) 36.0 - 46.0 %   MCV 98.1 80.0 - 100.0 fL   MCH 31.5 26.0 - 34.0 pg   MCHC 32.1 30.0 - 36.0 g/dL   RDW 20.6 (H) 11.5 - 15.5 %   Platelets 113 (L) 150 - 400 K/uL    Comment: Immature Platelet Fraction may be clinically indicated, consider ordering this additional test [id number] REPEATED TO VERIFY    nRBC 0.0 0.0 - 0.2 %   Neutrophils Relative % 50 %   Neutro Abs 1.5 (L) 1.7 - 7.7 K/uL   Lymphocytes Relative 31 %   Lymphs Abs 0.9 0.7 - 4.0 K/uL   Monocytes Relative 16 %   Monocytes Absolute 0.5 0.1 - 1.0 K/uL   Eosinophils Relative 2 %   Eosinophils Absolute 0.1 0.0 - 0.5 K/uL   Basophils Relative 1 %   Basophils Absolute 0.0 0.0 - 0.1 K/uL   Immature Granulocytes 0 %   Abs Immature Granulocytes 0.00 0.00 - 0.07 K/uL    Comment: Performed at [facility], [place] [address], [place], [place] [address]  Comprehensive metabolic panel     Status: Abnormal   Collection Time: 05/29/21  1:20 AM  Result Value Ref Range   Sodium 138 135 - 145 mmol/L   Potassium 3.7 3.5 - 5.1 mmol/L   Chloride  111 98 - 111 mmol/L   CO2 22 22 - 32 mmol/L   Glucose, Bld 119 (H) 70 - 99 mg/dL    Comment: Glucose reference range applies only to samples taken after fasting for at least 8 hours.   BUN <5 (L) 6 - 20 mg/dL   Creatinine, Ser 0.63 0.44 - 1.00 mg/dL   Calcium 8.3 (L) 8.9 - 10.3 mg/dL   Total Protein 5.5 (L) 6.5 - 8.1 g/dL   Albumin 2.6 (L) 3.5 - 5.0 g/dL   AST 86 (H) 15 - 41 U/L   ALT 25 0 - 44 U/L   Alkaline Phosphatase 64 38 - 126 U/L   Total Bilirubin 1.1 0.3 - 1.2 mg/dL   GFR, Estimated >60 >60 mL/min    Comment: (NOTE) Calculated using the CKD-EPI Creatinine Equation (2021)    Anion gap 5 5 - 15    Comment: Performed at [facility], [place] [address], [place], [place] [address]  Magnesium     Status: Abnormal   Collection Time: 05/29/21  1:20 AM  Result Value Ref Range   Magnesium 1.5 (L) 1.7 - 2.4 mg/dL    Comment: Performed at [place] [place] [address], [place], [place] [address]  Phosphorus     Status: Abnormal   Collection Time: 05/29/21  1:20 AM  Result Value Ref Range   Phosphorus 2.2 (L) 2.5 - 4.6 mg/dL    Comment: Performed at [facility] [address] [place], [place] [address]  CBC with Differential/Platelet     Status: Abnormal   Collection Time: 05/30/21  4:10 AM  Result Value Ref Range   WBC 3.2 (L) 4.0 - 10.5 K/uL   RBC 2.76 (L) 3.87 - 5.11 MIL/uL  Hemoglobin 8.4 (L) 12.0 - 15.0 g/dL   HCT 15.0 (L) 56.9 - 79.4 %   MCV 97.1 80.0 - 100.0 fL   MCH 30.4 26.0 - 34.0 pg   MCHC 31.3 30.0 - 36.0 g/dL   RDW 80.1 (H) 65.5 - 37.4 %   Platelets 133 (L) 150 - 400 K/uL    Comment: REPEATED TO VERIFY   nRBC 0.0 0.0 - 0.2 %   Neutrophils Relative % 43 %   Neutro Abs 1.4 (L) 1.7 - 7.7 K/uL   Lymphocytes Relative 37 %   Lymphs Abs 1.2 0.7 - 4.0 K/uL   Monocytes Relative 16 %   Monocytes Absolute 0.5 0.1 - 1.0 K/uL   Eosinophils Relative 3 %   Eosinophils Absolute 0.1 0.0 - 0.5 K/uL   Basophils Relative 1 %   Basophils Absolute 0.0 0.0 - 0.1  K/uL   Immature Granulocytes 0 %   Abs Immature Granulocytes 0.00 0.00 - 0.07 K/uL    Comment: Performed at [facility], [address] [place], [place] [address]  Comprehensive metabolic panel     Status: Abnormal   Collection Time: 05/30/21  4:10 AM  Result Value Ref Range   Sodium 138 135 - 145 mmol/L   Potassium 3.1 (L) 3.5 - 5.1 mmol/L   Chloride 108 98 - 111 mmol/L   CO2 22 22 - 32 mmol/L   Glucose, Bld 81 70 - 99 mg/dL    Comment: Glucose reference range applies only to samples taken after fasting for at least 8 hours.   BUN <5 (L) 6 - 20 mg/dL   Creatinine, Ser 8.67 (L) 0.44 - 1.00 mg/dL   Calcium 8.4 (L) 8.9 - 10.3 mg/dL   Total Protein 5.8 (L) 6.5 - 8.1 g/dL   Albumin 2.8 (L) 3.5 - 5.0 g/dL   AST 82 (H) 15 - 41 U/L   ALT 23 0 - 44 U/L   Alkaline Phosphatase 62 38 - 126 U/L   Total Bilirubin 1.8 (H) 0.3 - 1.2 mg/dL   GFR, Estimated >54 >49 mL/min    Comment: (NOTE) Calculated using the CKD-EPI Creatinine Equation (2021)    Anion gap 8 5 - 15    Comment: Performed at [facility], [address] [place], [place] [address]  Magnesium     Status: Abnormal   Collection Time: 05/30/21  4:10 AM  Result Value Ref Range   Magnesium 1.6 (L) 1.7 - 2.4 mg/dL    Comment: Performed at [facility], [address], [place], [place] [address]  Phosphorus     Status: Abnormal   Collection Time: 05/30/21  4:10 AM  Result Value Ref Range   Phosphorus 5.0 (H) 2.5 - 4.6 mg/dL    Comment: Performed at [facility], [address] [place], [place] [address]  Lipase, blood     Status: Abnormal   Collection Time: 05/30/21  4:10 AM  Result Value Ref Range   Lipase 104 (H) 11 - 51 U/L    Comment: Performed at [facility], [address] [place], [place] [address]    Current Facility-Administered Medications  Medication Dose Route Frequency Provider Last Rate Last Admin   (feeding supplement) PROSource Plus liquid 30 mL  30 mL Oral BID BM Leroy Sea, MD   30 mL at 05/30/21 1155   acetaminophen (TYLENOL) tablet 650 mg  650 mg Oral Q6H PRN Howerter, Justin B, DO  650 mg at 05/29/21 [id number]   Or   acetaminophen (TYLENOL) suppository 650 mg  650 mg Rectal Q6H PRN Howerter, Justin B, DO       albuterol (PROVENTIL) (2.5 MG/3ML) 0.083% nebulizer solution 2.5 mg  2.5 mg Nebulization Q4H PRN Ghimire, Henreitta Leber, MD       alum & mag hydroxide-simeth (MAALOX/MYLANTA) 200-200-20 MG/5ML suspension 30 mL  30 mL Oral Q6H PRN Ghimire, Henreitta Leber, MD       amLODipine (NORVASC) tablet 10 mg  10 mg Oral Daily Howerter, Justin B, DO   10 mg at 05/30/21 0846   dextrose 5 % in lactated ringers infusion   Intravenous Continuous Thurnell Lose, MD 75 mL/hr at 05/29/21 1239 New Bag at 05/29/21 1239   enoxaparin (LOVENOX) injection 40 mg  40 mg Subcutaneous Q24H Thurnell Lose, MD   40 mg at 05/30/21 1155   HYDROcodone-acetaminophen (NORCO/VICODIN) 5-325 MG per tablet 1 tablet  1 tablet Oral Q4H PRN Thurnell Lose, MD   1 tablet at 05/30/21 0846   magnesium oxide (MAG-OX) tablet 800 mg  800 mg Oral BID Thurnell Lose, MD   800 mg at 05/30/21 0846   metoprolol tartrate (LOPRESSOR) injection 5 mg  5 mg Intravenous Q4H PRN Jonetta Osgood, MD       metoprolol tartrate (LOPRESSOR) tablet 50 mg  50 mg Oral BID Jonetta Osgood, MD   50 mg at 05/30/21 0847   mirtazapine (REMERON) tablet 15 mg  15 mg Oral QHS Massengill, Ovid Curd, MD   15 mg at 05/29/21 2206   morphine (PF) 2 MG/ML injection 2 mg  2 mg Intravenous Q3H PRN Thurnell Lose, MD   2 mg at 05/30/21 1200   multivitamin with minerals tablet 1 tablet  1 tablet Oral Daily Thurnell Lose, MD   1 tablet at 05/30/21 0846   naloxone [facility]) injection 0.4 mg  0.4 mg Intravenous PRN Howerter, Justin B, DO       ondansetron (ZOFRAN) injection 4 mg  4 mg Intravenous Q6H PRN Howerter, Justin B, DO   4 mg at 05/29/21 1245   pantoprazole (PROTONIX) injection 40 mg  40 mg Intravenous Q12H Jonetta Osgood, MD   40 mg at 05/30/21 0846   potassium chloride SA (KLOR-CON M) CR tablet 40 mEq  40 mEq Oral Once Thurnell Lose, MD       sucralfate (CARAFATE) 1 GM/10ML suspension 1 g  1 g Oral TID WC & HS Ghimire, Henreitta Leber, MD   1 g at 05/30/21 1155              Psychiatric Specialty Exam:  Presentation  General Appearance: Appropriate for Environment; Casual; Well Groomed  Eye Contact:Good  Speech:Clear and Coherent; Normal Rate  Speech Volume:Normal  Handedness:Right  Mood and Affect  Mood:Euthymic  Affect:Appropriate; Congruent   Thought Process  Thought Processes:Goal Directed; Coherent; Linear  Descriptions of Associations:Intact  Orientation:Full (Time, Place and Person) Thought Content:WDL  History of Schizophrenia/Schizoaffective disorder:No data recorded Duration of Psychotic Symptoms:No data recorded Hallucinations:Hallucinations: None  Ideas of Reference:None  Suicidal Thoughts:Suicidal Thoughts: No  Homicidal Thoughts:Homicidal Thoughts: No   Sensorium  Memory:Immediate Good; Recent Good; Remote Good  Judgment:Good  Insight:Good   Executive Functions  Concentration:Good  Attention Span:Good  [place] of Knowledge:Good Language:Good  Psychomotor Activity  Psychomotor Activity:Psychomotor Activity: Normal  Assets  Assets:Communication Skills; Desire for Improvement; Financial Resources/Insurance; Housing; Leisure Time; Resilience; Social Support  Sleep  Sleep:Sleep:  Poor Number of Hours of Sleep: 3   Physical Exam: Physical Exam Vitals reviewed.   Review of Systems  Psychiatric/Behavioral:  Positive for depression, substance abuse and suicidal ideas. The patient is nervous/anxious and has insomnia.    Blood pressure 131/67, pulse 77, temperature 98.5 F (36.9 C), temperature source Oral, resp. rate 20, height 5\' 3"  (1.6 m), weight 62 kg, SpO2 98 %. Body mass index is 24.2 kg/m.  Treatment Plan  Summary: Daily contact with patient to assess and evaluate symptoms and progress in treatment  Assessment: MDD, severe, recurrent, without psychosis GAD Rule out panic disorder PTSD Alcohol use disorder, severe  Plan: -Continue Remeron 15 mg nightly for MDD, GAD, insomnia, loss of appetite.  -Will start Melatonin 10mg  po qhs for insomnia.  Patient will likely benefit from antipsychotic or mood stabilizer. She continues to present with electrolyte abnormalities, and started clear liquid diet today. Will obtain EKG, as patient may benefit from augmentation of the antipsychotic in the near future. -Referral made to [facility] for Group therapy and counseling, medication management, higher level of care in the comfort of her home, patient cites transportation as a barrier to seeking outpatient treatment.  She no longer meets inpatient criteria at this time. Patient has consistently denied suicidal ideations with intent since admission.    Psychiatric team will follow from a distance.   Disposition: No evidence of imminent risk to self or others at present.   Patient does not meet criteria for psychiatric inpatient admission. Supportive therapy provided about ongoing stressors. Refer to IOP. Discussed crisis plan, support from social network, calling 911, coming to the Emergency Department, and calling Suicide Hotline.  Suella Broad, FNP 05/30/2021 1:26 PM  Total Time Spent in Direct Patient Care:  I personally spent 30 minutes on the unit in direct patient care. The direct patient care time included face-to-face time with the patient, reviewing the patient's chart, communicating with other professionals, and coordinating care. Greater than 50% of this time was spent in counseling or coordinating care with the patient regarding goals of hospitalization, psycho-education, and discharge planning needs.

## 2021-05-30 NOTE — Plan of Care (Signed)
  Problem: Pain Managment: Goal: General experience of comfort will improve Outcome: Progressing   Problem: Safety: Goal: Ability to remain free from injury will improve Outcome: Progressing   

## 2021-05-31 ENCOUNTER — Telehealth (HOSPITAL_COMMUNITY): Payer: Self-pay | Admitting: Psychiatry

## 2021-05-31 DIAGNOSIS — R1013 Epigastric pain: Secondary | ICD-10-CM | POA: Diagnosis not present

## 2021-05-31 LAB — CBC WITH DIFFERENTIAL/PLATELET
Abs Immature Granulocytes: 0.01 10*3/uL (ref 0.00–0.07)
Basophils Absolute: 0 10*3/uL (ref 0.0–0.1)
Basophils Relative: 1 %
Eosinophils Absolute: 0.1 10*3/uL (ref 0.0–0.5)
Eosinophils Relative: 3 %
HCT: 25.5 % — ABNORMAL LOW (ref 36.0–46.0)
Hemoglobin: 7.9 g/dL — ABNORMAL LOW (ref 12.0–15.0)
Immature Granulocytes: 0 %
Lymphocytes Relative: 37 %
Lymphs Abs: 1 10*3/uL (ref 0.7–4.0)
MCH: 30.5 pg (ref 26.0–34.0)
MCHC: 31 g/dL (ref 30.0–36.0)
MCV: 98.5 fL (ref 80.0–100.0)
Monocytes Absolute: 0.5 10*3/uL (ref 0.1–1.0)
Monocytes Relative: 19 %
Neutro Abs: 1.1 10*3/uL — ABNORMAL LOW (ref 1.7–7.7)
Neutrophils Relative %: 40 %
Platelets: 108 10*3/uL — ABNORMAL LOW (ref 150–400)
RBC: 2.59 MIL/uL — ABNORMAL LOW (ref 3.87–5.11)
RDW: 20 % — ABNORMAL HIGH (ref 11.5–15.5)
WBC: 2.7 10*3/uL — ABNORMAL LOW (ref 4.0–10.5)
nRBC: 0 % (ref 0.0–0.2)

## 2021-05-31 LAB — MAGNESIUM: Magnesium: 1.7 mg/dL (ref 1.7–2.4)

## 2021-05-31 LAB — COMPREHENSIVE METABOLIC PANEL
ALT: 22 U/L (ref 0–44)
AST: 76 U/L — ABNORMAL HIGH (ref 15–41)
Albumin: 2.6 g/dL — ABNORMAL LOW (ref 3.5–5.0)
Alkaline Phosphatase: 54 U/L (ref 38–126)
Anion gap: 8 (ref 5–15)
BUN: <5 mg/dL — ABNORMAL LOW (ref 6–20)
CO2: 21 mmol/L — ABNORMAL LOW (ref 22–32)
Calcium: 8.5 mg/dL — ABNORMAL LOW (ref 8.9–10.3)
Chloride: 110 mmol/L (ref 98–111)
Creatinine, Ser: 0.46 mg/dL (ref 0.44–1.00)
GFR, Estimated: >60 mL/min (ref >60)
Glucose, Bld: 80 mg/dL (ref 70–99)
Potassium: 4.3 mmol/L (ref 3.5–5.1)
Sodium: 139 mmol/L (ref 135–145)
Total Bilirubin: 1.4 mg/dL — ABNORMAL HIGH (ref 0.3–1.2)
Total Protein: 5.4 g/dL — ABNORMAL LOW (ref 6.5–8.1)

## 2021-05-31 LAB — PHOSPHORUS: Phosphorus: 3.6 mg/dL (ref 2.5–4.6)

## 2021-05-31 MED ORDER — DOCUSATE SODIUM 100 MG PO CAPS
200.0000 mg | ORAL_CAPSULE | Freq: Two times a day (BID) | ORAL | Status: DC
Start: 2021-05-31 — End: 2021-06-02
  Administered 2021-06-02: 200 mg via ORAL
  Filled 2021-05-31 (×2): qty 2

## 2021-05-31 MED ORDER — BOOST PLUS PO LIQD
237.0000 mL | Freq: Three times a day (TID) | ORAL | Status: DC
  Administered 2021-05-31 – 2021-06-02 (×3): 237 mL via ORAL
  Filled 2021-05-31 (×8): qty 237

## 2021-05-31 MED ORDER — POLYETHYLENE GLYCOL 3350 17 G PO PACK
17.0000 g | PACK | Freq: Two times a day (BID) | ORAL | Status: DC
  Filled 2021-05-31: qty 1

## 2021-05-31 MED ORDER — BISACODYL 5 MG PO TBEC: 10.0000 mg | DELAYED_RELEASE_TABLET | Freq: Once | ORAL | Status: DC

## 2021-05-31 NOTE — TOC Initial Note (Signed)
Transition of Care [facility]) - Initial/Assessment Note    Patient Details  Name: Nicole Hughes MRN: [id number] Date of Birth: 1970/10/17  Transition [facility] CM/SW Contact:    Benard Halsted, LCSW Phone Number: 05/31/2021, 4:16 PM  Clinical Narrative:                 CSW met with patient to discuss discharge plans. Patient reported that she is going to follow up with [facility] since it is virtual and will work well for her. She declined needing any other resources and stated that she felt safe returning home.   Expected Discharge Plan: Home/Self Care Barriers to Discharge: Continued Medical Work up   Patient Goals and CMS Choice Patient states their goals for this hospitalization and ongoing recovery are:: ETOH rehab      Expected Discharge Plan and Services Expected Discharge Plan: Home/Self Care In-house Referral: Clinical Social Work     Living arrangements for the past 2 months: Single Family Home                                      Prior Living Arrangements/Services Living arrangements for the past 2 months: Single Family Home   Patient language and need for interpreter reviewed:: Yes Do you feel safe going back to the place where you live?: Yes            Criminal Activity/Legal Involvement Pertinent to Current Situation/Hospitalization: No - Comment as needed  Activities of Daily Living Home Assistive Devices/Equipment: None ADL Screening (condition at time of admission) Patient's cognitive ability adequate to safely complete daily activities?: No Is the patient deaf or have difficulty hearing?: No Does the patient have difficulty seeing, even when wearing glasses/contacts?: No Does the patient have difficulty concentrating, remembering, or making decisions?: No Patient able to express need for assistance with ADLs?: No Does the patient have difficulty dressing or bathing?: No Independently performs ADLs?: Yes (appropriate for developmental  age) Communication: Independent Dressing (OT): Independent Grooming: Independent Feeding: Independent Bathing: Independent Toileting: Independent In/Out Bed: Needs assistance Is this a change from baseline?: Pre-admission baseline Walks in Home: Needs assistance Is this a change from baseline?: Pre-admission baseline Does the patient have difficulty walking or climbing stairs?: Yes (unstable gait) Weakness of Legs: Both Weakness of Arms/Hands: None  Permission Sought/Granted                  Emotional Assessment Appearance:: Appears older than stated age Attitude/Demeanor/Rapport: Engaged Affect (typically observed): Accepting, Appropriate, Pleasant Orientation: : Oriented to Self, Oriented to Place, Oriented to  Time, Oriented to Situation Alcohol / Substance Use: Alcohol Use Psych Involvement: Yes (comment)  Admission diagnosis:  Epigastric pain [R10.13] Alcohol withdrawal syndrome without complication ([place]) [K02.542] Patient Active Problem List   Diagnosis Date Noted   Transaminitis 05/23/2021   High anion gap metabolic acidosis 70/62/3762   Fall at home, initial encounter 12/14/2020   Falls frequently 12/14/2020   Syncope 83/15/1761   Folic acid deficiency 60/73/7106   Acute diastolic CHF (congestive heart failure) ([place]) 12/14/2020   Alcohol withdrawal ([place]) 07/08/2020   Prolonged QT interval 05/19/2020   Hypomagnesemia 12/24/2019   Dysphagia 05/31/2019   Chronic anemia 03/31/2018   Appetite loss 03/08/2018   Myofascial pain 03/08/2018   IBS (irritable bowel syndrome) 26/94/8546   Alcoholic hepatitis without ascites 03/05/2018   Elevated LFTs 03/03/2018   Achilles tendonitis 01/28/2018  Abnormal cervical Papanicolaou smear 09/29/2017   Insomnia 09/29/2017   Chest wall pain 05/25/2017   Elevated lipase 05/25/2017   Depression with anxiety 02/23/2017   MRSA carrier 02/23/2017   Hypokalemia 09/24/2016   Chronic alcohol abuse 09/22/2016   H/O acute  pancreatitis 09/22/2016   GERD (gastroesophageal reflux disease) 04/09/2015   Epigastric pain 01/17/2014   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/24/2012   Chest pain 08/20/2012   Stress reaction 07/23/2012   Essential hypertension 06/09/2012   History of pulmonary embolism 06/09/2012   Preop cardiovascular exam 06/08/2012   Dysmenorrhea    Dyspareunia    Pelvic pain    PCP:  Pcp, No Pharmacy:   [facility] [address]), [place] [address] [place] ([place] [address] Phone: [phone] Fax: [phone]  [facility] ([place], [facility] [address] [place] [address] Phone: [phone] Fax: [phone]  [facility] [address] [place] [address] Phone: [phone] Fax: [phone]     Social Determinants of Health (SDOH) Interventions    Readmission Risk Interventions    12/20/2020   10:30 AM  Readmission Risk Prevention Plan  Transportation Screening Complete  PCP or Specialist Appt within 3-5 Days Complete  HRI or Home Care Consult Complete  Social Work Consult for [place] Planning/Counseling Not Complete  SW consult not completed comments insurance doesn't cover  Palliative Care Screening Not Applicable  Medication Review Press photographer) Complete

## 2021-05-31 NOTE — Telephone Encounter (Signed)
D:  Nicole Bee, NP referred pt to [facility].  A:  Placed call to orient pt.  First time calling, pt hung up.  Called pt a second time and got her vm.  Case manager left her name and number requesting pt to call back.  Informed her that [facility] closes early on Fridays and the program is closed on 06-03-21 d/t Memorial Day observance.  Inform [name].

## 2021-05-31 NOTE — Progress Notes (Signed)
PROGRESS NOTE        PATIENT DETAILS Name: Nicole Hughes Age: 51 y.o. Sex: female Date of Birth: 15-Feb-1970 Admit Date: 05/22/2021 Admitting Physician Rhetta Mura, DO PCP:Pcp, No  Brief Summary: Patient is a 51 y.o.  female with history of EtOH use, GERD, HTN-who presented to the ED with epigastric pain-she was thought to have pancreatitis-alcohol withdrawal-and subsequently admitted to the hospitalist service.   Significant events: 5/17>> admit to [facility]-epigastric pain-EtOH withdrawal  Significant studies: 5/17>> CXR: No PNA 5/17>> CT abdomen/pelvis: No acute abnormality. 5/17>> lipase: 141  Significant microbiology data: 5/17>> COVID/influenza PCR: Negative  Procedures: None  Consults: Psych  Subjective: Patient in bed, appears comfortable, denies any headache, no fever, no chest pain or pressure, no shortness of breath , much improved abdominal pain. No new focal weakness.   Objective: Vitals: Blood pressure 125/80, pulse 85, temperature 99.2 F (37.3 C), temperature source Oral, resp. rate 16, height 5\' 3"  (1.6 m), weight 61.1 kg, SpO2 100 %.   Exam:  Awake Alert, No new F.N deficits, Normal affect [place].AT,PERRAL Supple Neck, No JVD,   Symmetrical Chest wall movement, Good air movement bilaterally, CTAB RRR,No Gallops, Rubs or new Murmurs,  +ve B.Sounds, Abd Soft, No tenderness,   No Cyanosis, Clubbing or edema    Assessment/Plan:  Epigastric pain-likely due to acute alcoholic pancreatitis: Diagnosed clinically with epigastric pain, alcohol abuse and elevated lipase, CT scan of the abdomen pelvis was reassuring, strictly counseled to quit alcohol, pain had improved but with diet challenge pain got worse on 05/28/2021, she was placed again on strict bowel rest on 05/28/2021, IV fluids and supportive care.  Exam is benign hemodynamically stable.  Pain has continued to improve on 05/30/2021 and she wants to try clear liquids & now to  soft diet on 05/31/21 along with protein supplementation by mouth.  If pain reoccurs then most likely will place PICC line with TNA.  Alcoholic hepatitis: Mild-supportive care.  EtOH withdrawal: In early DTs, improving with CIWA protocol.  Strictly counseled to quit alcohol.  HTN: BP relatively stable-continue metoprolol and amlodipine.  Hypokalemia, hypomagnesemia and hypophosphatemia: Repleted again and recheck  Normocytic anemia: Chronic issue-watch closely.  Transfuse if Hb <7.  Probably due to bone marrow suppression from alcohol use.  Suicidal ideation night of 05/25/2021.  Psych called, continue suicidal precautions, per psych [facility] admission once medically cleared, per Psych ok to DC sitter.  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  Underweight: Estimated body mass index is 23.86 kg/m as calculated from the following:   Height as of this encounter: 5\' 3"  (1.6 m).   Weight as of this encounter: 61.1 kg.   Code status:   Code Status: Full Code   DVT Prophylaxis: Start Lovenox on 05/27/2021. enoxaparin (LOVENOX) injection 40 mg Start: 05/27/21 1145 SCDs Start: 05/23/21 0017   Family Communication: None at bedside   Disposition Plan: Post discharge to inpatient psych facility.   Diet: Diet Order             DIET SOFT Room service appropriate? Yes; Fluid consistency: Thin  Diet effective now                     Antimicrobial agents: Anti-infectives (From admission, onward)    None        MEDICATIONS: Scheduled Meds:  (feeding supplement) PROSource  Plus  30 mL Oral BID BM   amLODipine  10 mg Oral Daily   bisacodyl  10 mg Oral Once   docusate sodium  200 mg Oral BID   enoxaparin (LOVENOX) injection  40 mg Subcutaneous Q24H   lactose free nutrition  237 mL Oral TID WC   magnesium oxide  800 mg Oral BID   melatonin  10 mg Oral QHS   metoprolol tartrate  50 mg Oral BID   mirtazapine  15 mg Oral QHS    multivitamin with minerals  1 tablet Oral Daily   pantoprazole (PROTONIX) IV  40 mg Intravenous Q12H   polyethylene glycol  17 g Oral BID   sucralfate  1 g Oral TID WC & HS   Continuous Infusions:  dextrose 5% lactated ringers 75 mL/hr at 05/30/21 2030   PRN Meds:.acetaminophen **OR** acetaminophen, albuterol, alum & mag hydroxide-simeth, HYDROcodone-acetaminophen, metoprolol tartrate, morphine injection, naLOXone (NARCAN)  injection, ondansetron (ZOFRAN) IV   I have personally reviewed following labs and imaging studies  LABORATORY DATA:  Recent Labs  Lab 05/27/21 0206 05/28/21 0237 05/29/21 0120 05/30/21 0410 05/31/21 0133  WBC 3.3* 2.5* 3.0* 3.2* 2.7*  HGB 8.8* 8.0* 8.1* 8.4* 7.9*  HCT 27.3* 24.5* 25.2* 26.8* 25.5*  PLT 145* 116* 113* 133* 108*  MCV 96.8 96.1 98.1 97.1 98.5  MCH 31.2 31.4 31.5 30.4 30.5  MCHC 32.2 32.7 32.1 31.3 31.0  RDW 22.2* 21.5* 20.6* 20.2* 20.0*  LYMPHSABS 1.0 0.8 0.9 1.2 1.0  MONOABS 0.3 0.4 0.5 0.5 0.5  EOSABS 0.1 0.1 0.1 0.1 0.1  BASOSABS 0.1 0.0 0.0 0.0 0.0    Recent Labs  Lab 05/26/21 0139 05/27/21 0206 05/28/21 0237 05/29/21 0120 05/30/21 0410 05/31/21 0133  NA 140 140 139 138 138 139  K 3.3* 4.1 4.0 3.7 3.1* 4.3  CL 112* 110 111 111 108 110  CO2 22 24 23 22 22  21*  GLUCOSE 110* 81 97 119* 81 80  BUN <5* <5* <5* <5* <5* <5*  CREATININE 0.54 0.57 0.63 0.63 0.43* 0.46  CALCIUM 7.4* 8.2* 8.1* 8.3* 8.4* 8.5*  AST 131* 118* 101* 86* 82* 76*  ALT 28 31 28 25 23 22   ALKPHOS 69 71 63 64 62 54  BILITOT 1.8* 1.7* 1.5* 1.1 1.8* 1.4*  ALBUMIN 2.7* 2.9* 2.5* 2.6* 2.8* 2.6*  MG 1.2* 1.5* 1.4* 1.5* 1.6* 1.7  PHOS 2.4* 2.4* 2.7 2.2* 5.0* 3.6  BNP 95.6 123.8* 89.9  --   --   --     RADIOLOGY STUDIES/RESULTS: No results found.   LOS: 8 days   Signature  Lala Lund M.D on 05/31/2021 at 9:54 AM   -  To page go to [email or web address]

## 2021-06-01 ENCOUNTER — Inpatient Hospital Stay (HOSPITAL_COMMUNITY): Payer: BC Managed Care – PPO

## 2021-06-01 DIAGNOSIS — M79601 Pain in right arm: Secondary | ICD-10-CM

## 2021-06-01 DIAGNOSIS — M79602 Pain in left arm: Secondary | ICD-10-CM

## 2021-06-01 LAB — CBC WITH DIFFERENTIAL/PLATELET
Abs Immature Granulocytes: 0.01 10*3/uL (ref 0.00–0.07)
Basophils Absolute: 0 10*3/uL (ref 0.0–0.1)
Basophils Relative: 1 %
Eosinophils Absolute: 0.1 10*3/uL (ref 0.0–0.5)
Eosinophils Relative: 3 %
HCT: 26.7 % — ABNORMAL LOW (ref 36.0–46.0)
Hemoglobin: 8.2 g/dL — ABNORMAL LOW (ref 12.0–15.0)
Immature Granulocytes: 0 %
Lymphocytes Relative: 37 %
Lymphs Abs: 1.2 10*3/uL (ref 0.7–4.0)
MCH: 30.1 pg (ref 26.0–34.0)
MCHC: 30.7 g/dL (ref 30.0–36.0)
MCV: 98.2 fL (ref 80.0–100.0)
Monocytes Absolute: 0.5 10*3/uL (ref 0.1–1.0)
Monocytes Relative: 17 %
Neutro Abs: 1.4 10*3/uL — ABNORMAL LOW (ref 1.7–7.7)
Neutrophils Relative %: 42 %
Platelets: 105 10*3/uL — ABNORMAL LOW (ref 150–400)
RBC: 2.72 MIL/uL — ABNORMAL LOW (ref 3.87–5.11)
RDW: 19.7 % — ABNORMAL HIGH (ref 11.5–15.5)
WBC: 3.2 10*3/uL — ABNORMAL LOW (ref 4.0–10.5)
nRBC: 0 % (ref 0.0–0.2)

## 2021-06-01 LAB — COMPREHENSIVE METABOLIC PANEL
ALT: 22 U/L (ref 0–44)
AST: 77 U/L — ABNORMAL HIGH (ref 15–41)
Albumin: 2.5 g/dL — ABNORMAL LOW (ref 3.5–5.0)
Alkaline Phosphatase: 56 U/L (ref 38–126)
Anion gap: 7 (ref 5–15)
BUN: 5 mg/dL — ABNORMAL LOW (ref 6–20)
CO2: 24 mmol/L (ref 22–32)
Calcium: 8.6 mg/dL — ABNORMAL LOW (ref 8.9–10.3)
Chloride: 109 mmol/L (ref 98–111)
Creatinine, Ser: 0.54 mg/dL (ref 0.44–1.00)
GFR, Estimated: >60 mL/min (ref >60)
Glucose, Bld: 93 mg/dL (ref 70–99)
Potassium: 4.1 mmol/L (ref 3.5–5.1)
Sodium: 140 mmol/L (ref 135–145)
Total Bilirubin: 1 mg/dL (ref 0.3–1.2)
Total Protein: 5.6 g/dL — ABNORMAL LOW (ref 6.5–8.1)

## 2021-06-01 LAB — PHOSPHORUS: Phosphorus: 3.7 mg/dL (ref 2.5–4.6)

## 2021-06-01 LAB — MAGNESIUM: Magnesium: 1.5 mg/dL — ABNORMAL LOW (ref 1.7–2.4)

## 2021-06-01 MED ORDER — MAGNESIUM SULFATE 4 GM/100ML IV SOLN
4.0000 g | Freq: Once | INTRAVENOUS | Status: AC
  Administered 2021-06-01: 4 g via INTRAVENOUS
  Filled 2021-06-01: qty 100

## 2021-06-01 MED ORDER — ENOXAPARIN SODIUM 60 MG/0.6ML IJ SOSY
1.0000 mg/kg | PREFILLED_SYRINGE | Freq: Two times a day (BID) | INTRAMUSCULAR | Status: DC
  Administered 2021-06-01 – 2021-06-02 (×2): 60 mg via SUBCUTANEOUS
  Filled 2021-06-01 (×2): qty 0.6

## 2021-06-01 MED ORDER — PANTOPRAZOLE SODIUM 40 MG PO TBEC
40.0000 mg | DELAYED_RELEASE_TABLET | Freq: Two times a day (BID) | ORAL | Status: DC
  Administered 2021-06-02 (×2): 40 mg via ORAL
  Filled 2021-06-01 (×2): qty 1

## 2021-06-01 NOTE — Progress Notes (Signed)
Upper extremity venous bilateral study completed.  Preliminary results relayed to Candiss Norse, MD.  See CV Proc for preliminary results report.   Darlin Coco, RDMS, RVT

## 2021-06-01 NOTE — Progress Notes (Signed)
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   Medical History: Past Medical History:  Diagnosis Date   Anxiety    Cardiomyopathy ([place])    Chronic lower back pain    Complication of anesthesia    "hard to get under"   Cyst of ovary    Depression    hx   DVT complicating pregnancy [id number]   RLE; RUE   Dysmenorrhea    Dyspareunia    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/24/2012   GERD (gastroesophageal reflux disease)    Hay fever    "fall and spring" (02/17/2017)   Headache    "2-3 times/.wik" (02/17/2017)   History of chicken pox    Hypertension    Migraine    "monthly" (02/17/2017)   Pelvic pain    Pulmonary embolism ([place]) 08/1998   S/P childbirth    Medications:  Medications Prior to Admission  Medication Sig Dispense Refill Last Dose   amLODipine (NORVASC) 10 MG tablet Take 1 tablet (10 mg total) by mouth daily. 30 tablet 5 05/21/2021   Carboxymethylcellul-Glycerin (CLEAR EYES FOR DRY EYES OP) Place 1 drop into both eyes daily as needed (dryness).   05/22/2021   lactulose, encephalopathy, (CHRONULAC) 10 GM/15ML SOLN Take 30 mLs (20 g total) by mouth 2 (two) times daily. 240 mL 0 Past Week   metoprolol succinate (TOPROL-XL) 100 MG 24 hr tablet TAKE 1 TABLET BY MOUTH ONCE DAILY WITH A MEAL OR IMMEDIATELY FOLLOWING A MEAL. APPOINTMENT REQUIRED FOR FUTURE REFILLS  (Patient taking differently: Take 100 mg by mouth daily.) 30 tablet 3 05/21/2021 at 1900   traZODone (DESYREL) 50 MG tablet Take 2 tablets (100 mg total) by mouth at bedtime as needed for sleep. 60 tablet 5 Past Week    Assessment: Nicole Hughes is a 33yoF who presented to hospital with epigastric pain. 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 Patient was not on anticoagulation prior to admission. Last dose of Lovenox 40 mg (VTE prophylaxis) was 5/26 @ 1211. CBC and SCr stable.  Goal of Therapy:  Anti-Xa level 0.6-1 units/ml 4hrs after LMWH dose given Therapeutic anticoagulation Monitor platelets by anticoagulation protocol: Yes   Plan:  Lovenox 60 mg (1 mg/kg) subcutaneously every 12 hours Monitor CBC, renal function, and for bleeding F/u plan to transition to DOAC   Thank you for allowing [place] to participate in this patients care. Jens Som, PharmD 06/01/2021 11:23 AM  **Pharmacist phone directory can be found on [email or web address] listed under [place]**

## 2021-06-01 NOTE — Progress Notes (Signed)
MD verbal order to remove patients IV's and not to place another IV at this time.

## 2021-06-01 NOTE — Progress Notes (Signed)
PROGRESS NOTE        PATIENT DETAILS Name: Nicole Hughes Age: 51 y.o. Sex: female Date of Birth: July 29, 1970 Admit Date: 05/22/2021 Admitting Physician Angie Fava, DO PCP:Pcp, No  Brief Summary: Patient is a 51 y.o.  female with history of EtOH use, GERD, HTN-who presented to the ED with epigastric pain-she was thought to have pancreatitis-alcohol withdrawal-and subsequently admitted to the hospitalist service.   Significant events: 5/17>> admit to [facility]-epigastric pain-EtOH withdrawal  Significant studies: 5/17>> CXR: No PNA 5/17>> CT abdomen/pelvis: No acute abnormality. 5/17>> lipase: 141  Significant microbiology data: 5/17>> COVID/influenza PCR: Negative  Procedures: None  Consults: Psych  Subjective: Patient in bed, appears comfortable, denies any headache, no fever, no chest pain or pressure, no shortness of breath , no abdominal pain. No new focal weakness.  Still has bilateral upper extremity IV site discomfort.     Objective: Vitals: Blood pressure 120/72, pulse 88, temperature 98.5 F (36.9 C), temperature source Oral, resp. rate 20, height 5\' 3"  (1.6 m), weight 61.1 kg, SpO2 100 %.   Exam:  Awake Alert, Oriented X 3, No new F.N deficits, Normal affect [place].AT,PERRAL Supple Neck,No JVD, No cervical lymphadenopathy appriciated.  Symmetrical Chest wall movement, Good air movement bilaterally, CTAB RRR,No Gallops, Rubs or new Murmurs, No Parasternal Heave +ve B.Sounds, Abd Soft, No tenderness, No organomegaly appriciated, No rebound - guarding or rigidity. No Cyanosis, Clubbing or edema, No new Rash or bruise   Assessment/Plan:  Epigastric pain-likely due to acute alcoholic pancreatitis: Diagnosed clinically with epigastric pain, alcohol abuse and elevated lipase, CT scan of the abdomen pelvis was reassuring, strictly counseled to quit alcohol, pain had improved but with diet challenge pain got worse on 05/28/2021, she was  placed again on strict bowel rest on 05/28/2021, IV fluids and supportive care.  Exam is benign hemodynamically stable.  Pain has continued to improve continue on soft diet with protein supplementation.  Alcoholic hepatitis: Mild-supportive care.  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  Remove peripheral IVs, Lovenox full dose followed by Eliquis for 3 months.    EtOH withdrawal: In early DTs, improving with CIWA protocol.  Strictly counseled to quit alcohol.  HTN: BP relatively stable-continue metoprolol and amlodipine.  Hypokalemia, hypomagnesemia and hypophosphatemia: Repleted again and recheck  Normocytic anemia: Chronic issue-watch closely.  Transfuse if Hb <7.  Probably due to bone marrow suppression from alcohol use.  Suicidal ideation night of 05/25/2021.  Psych called, continue suicidal precautions, per psych [facility] admission once medically cleared, per Psych ok to DC sitter.  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  Underweight: Estimated body mass index is 23.86 kg/m as calculated from the following:   Height as of this encounter: 5\' 3"  (1.6 m).   Weight as of this encounter: 61.1 kg.   Code status:   Code Status: Full Code   DVT Prophylaxis: Start Lovenox on 05/27/2021. enoxaparin (LOVENOX) injection 40 mg Start: 05/27/21 1145 SCDs Start: 05/23/21 0017   Family Communication: None at bedside   Disposition Plan: Post discharge to inpatient psych facility.   Diet: Diet Order             DIET SOFT Room service appropriate? Yes; Fluid consistency: Thin  Diet effective now                     Antimicrobial  agents: Anti-infectives (From admission, onward)    None        MEDICATIONS: Scheduled Meds:  (feeding supplement) PROSource Plus  30 mL Oral BID BM   amLODipine  10 mg Oral Daily   bisacodyl  10 mg Oral Once   docusate sodium  200 mg Oral BID   enoxaparin (LOVENOX) injection  40 mg  Subcutaneous Q24H   lactose free nutrition  237 mL Oral TID WC   magnesium oxide  800 mg Oral BID   melatonin  10 mg Oral QHS   metoprolol tartrate  50 mg Oral BID   mirtazapine  15 mg Oral QHS   multivitamin with minerals  1 tablet Oral Daily   pantoprazole (PROTONIX) IV  40 mg Intravenous Q12H   polyethylene glycol  17 g Oral BID   sucralfate  1 g Oral TID WC & HS   Continuous Infusions:  magnesium sulfate bolus IVPB 4 g (06/01/21 0918)   PRN Meds:.acetaminophen **OR** acetaminophen, albuterol, alum & mag hydroxide-simeth, HYDROcodone-acetaminophen, metoprolol tartrate, morphine injection, naLOXone (NARCAN)  injection, ondansetron (ZOFRAN) IV   I have personally reviewed following labs and imaging studies  LABORATORY DATA:  Recent Labs  Lab 05/28/21 0237 05/29/21 0120 05/30/21 0410 05/31/21 0133 06/01/21 0409  WBC 2.5* 3.0* 3.2* 2.7* 3.2*  HGB 8.0* 8.1* 8.4* 7.9* 8.2*  HCT 24.5* 25.2* 26.8* 25.5* 26.7*  PLT 116* 113* 133* 108* 105*  MCV 96.1 98.1 97.1 98.5 98.2  MCH 31.4 31.5 30.4 30.5 30.1  MCHC 32.7 32.1 31.3 31.0 30.7  RDW 21.5* 20.6* 20.2* 20.0* 19.7*  LYMPHSABS 0.8 0.9 1.2 1.0 1.2  MONOABS 0.4 0.5 0.5 0.5 0.5  EOSABS 0.1 0.1 0.1 0.1 0.1  BASOSABS 0.0 0.0 0.0 0.0 0.0    Recent Labs  Lab 05/26/21 0139 05/27/21 0206 05/28/21 0237 05/29/21 0120 05/30/21 0410 05/31/21 0133 06/01/21 0409  NA 140 140 139 138 138 139 140  K 3.3* 4.1 4.0 3.7 3.1* 4.3 4.1  CL 112* 110 111 111 108 110 109  CO2 22 24 23 22 22  21* 24  GLUCOSE 110* 81 97 119* 81 80 93  BUN <5* <5* <5* <5* <5* <5* 5*  CREATININE 0.54 0.57 0.63 0.63 0.43* 0.46 0.54  CALCIUM 7.4* 8.2* 8.1* 8.3* 8.4* 8.5* 8.6*  AST 131* 118* 101* 86* 82* 76* 77*  ALT 28 31 28 25 23 22 22   ALKPHOS 69 71 63 64 62 54 56  BILITOT 1.8* 1.7* 1.5* 1.1 1.8* 1.4* 1.0  ALBUMIN 2.7* 2.9* 2.5* 2.6* 2.8* 2.6* 2.5*  MG 1.2* 1.5* 1.4* 1.5* 1.6* 1.7 1.5*  PHOS 2.4* 2.4* 2.7 2.2* 5.0* 3.6 3.7  BNP 95.6 123.8* 89.9  --   --   --    --     RADIOLOGY STUDIES/RESULTS: No results found.   LOS: 9 days   Signature  M.D on 06/01/2021 at 11:05 AM   -  To page go to [email or web address]

## 2021-06-02 LAB — CBC WITH DIFFERENTIAL/PLATELET
Abs Immature Granulocytes: 0.01 10*3/uL (ref 0.00–0.07)
Basophils Absolute: 0 10*3/uL (ref 0.0–0.1)
Basophils Relative: 1 %
Eosinophils Absolute: 0.1 10*3/uL (ref 0.0–0.5)
Eosinophils Relative: 2 %
HCT: 26.2 % — ABNORMAL LOW (ref 36.0–46.0)
Hemoglobin: 8.2 g/dL — ABNORMAL LOW (ref 12.0–15.0)
Immature Granulocytes: 0 %
Lymphocytes Relative: 34 %
Lymphs Abs: 1.1 10*3/uL (ref 0.7–4.0)
MCH: 30.3 pg (ref 26.0–34.0)
MCHC: 31.3 g/dL (ref 30.0–36.0)
MCV: 96.7 fL (ref 80.0–100.0)
Monocytes Absolute: 0.5 10*3/uL (ref 0.1–1.0)
Monocytes Relative: 16 %
Neutro Abs: 1.6 10*3/uL — ABNORMAL LOW (ref 1.7–7.7)
Neutrophils Relative %: 47 %
Platelets: 102 10*3/uL — ABNORMAL LOW (ref 150–400)
RBC: 2.71 MIL/uL — ABNORMAL LOW (ref 3.87–5.11)
RDW: 19.2 % — ABNORMAL HIGH (ref 11.5–15.5)
WBC: 3.4 10*3/uL — ABNORMAL LOW (ref 4.0–10.5)
nRBC: 0 % (ref 0.0–0.2)

## 2021-06-02 LAB — MAGNESIUM: Magnesium: 1.6 mg/dL — ABNORMAL LOW (ref 1.7–2.4)

## 2021-06-02 LAB — COMPREHENSIVE METABOLIC PANEL
ALT: 21 U/L (ref 0–44)
AST: 70 U/L — ABNORMAL HIGH (ref 15–41)
Albumin: 2.6 g/dL — ABNORMAL LOW (ref 3.5–5.0)
Alkaline Phosphatase: 55 U/L (ref 38–126)
Anion gap: 3 — ABNORMAL LOW (ref 5–15)
BUN: 5 mg/dL — ABNORMAL LOW (ref 6–20)
CO2: 24 mmol/L (ref 22–32)
Calcium: 8.3 mg/dL — ABNORMAL LOW (ref 8.9–10.3)
Chloride: 109 mmol/L (ref 98–111)
Creatinine, Ser: 0.54 mg/dL (ref 0.44–1.00)
GFR, Estimated: >60 mL/min (ref >60)
Glucose, Bld: 97 mg/dL (ref 70–99)
Potassium: 3.8 mmol/L (ref 3.5–5.1)
Sodium: 136 mmol/L (ref 135–145)
Total Bilirubin: 1.1 mg/dL (ref 0.3–1.2)
Total Protein: 5.7 g/dL — ABNORMAL LOW (ref 6.5–8.1)

## 2021-06-02 LAB — PHOSPHORUS: Phosphorus: 3.9 mg/dL (ref 2.5–4.6)

## 2021-06-02 MED ORDER — APIXABAN 5 MG PO TABS
10.0000 mg | ORAL_TABLET | Freq: Two times a day (BID) | ORAL | Status: DC
  Administered 2021-06-02: 10 mg via ORAL
  Filled 2021-06-02: qty 2

## 2021-06-02 MED ORDER — APIXABAN 5 MG PO TABS: 5.0000 mg | ORAL_TABLET | Freq: Two times a day (BID) | ORAL | Status: DC

## 2021-06-02 MED ORDER — MAGNESIUM OXIDE 400 MG PO CAPS
400.0000 mg | ORAL_CAPSULE | Freq: Two times a day (BID) | ORAL | 0 refills | Status: DC
Start: 2021-06-02 — End: 2021-06-02

## 2021-06-02 MED ORDER — APIXABAN 5 MG PO TABS: 5.0000 mg | ORAL_TABLET | Freq: Two times a day (BID) | ORAL | 0 refills | Status: AC

## 2021-06-02 MED ORDER — MAGNESIUM OXIDE -MG SUPPLEMENT 400 (240 MG) MG PO TABS
1600.0000 mg | ORAL_TABLET | Freq: Two times a day (BID) | ORAL | Status: DC
Start: 2021-06-02 — End: 2021-06-02
  Administered 2021-06-02: 1600 mg via ORAL
  Filled 2021-06-02: qty 4

## 2021-06-02 MED ORDER — PANTOPRAZOLE SODIUM 40 MG PO TBEC: 40.0000 mg | DELAYED_RELEASE_TABLET | Freq: Every day | ORAL | 0 refills | Status: DC

## 2021-06-02 MED ORDER — MIRTAZAPINE 15 MG PO TABS: 15.0000 mg | ORAL_TABLET | Freq: Every day | ORAL | 0 refills | Status: AC

## 2021-06-02 MED ORDER — APIXABAN 5 MG PO TABS: 10.0000 mg | ORAL_TABLET | Freq: Two times a day (BID) | ORAL | 0 refills | Status: AC

## 2021-06-02 MED ORDER — TRAMADOL HCL 50 MG PO TABS: 50.0000 mg | ORAL_TABLET | Freq: Two times a day (BID) | ORAL | 0 refills | Status: DC | PRN

## 2021-06-02 MED ORDER — ACETAMINOPHEN 500 MG PO TABS: 500.0000 mg | ORAL_TABLET | Freq: Three times a day (TID) | ORAL | 0 refills | Status: AC | PRN

## 2021-06-02 MED ORDER — COLCHICINE 0.6 MG PO TABS: 0.6000 mg | ORAL_TABLET | Freq: Every day | ORAL | 0 refills | Status: AC

## 2021-06-02 MED ORDER — MAGNESIUM OXIDE 400 MG PO CAPS: 800.0000 mg | ORAL_CAPSULE | Freq: Two times a day (BID) | ORAL | 0 refills | Status: AC

## 2021-06-02 NOTE — Discharge Instructions (Addendum)
Follow with Primary MD  in 7 days   Get CBC, CMP, Magnesium -  checked next visit within 1 week by Primary MD    Activity: As tolerated with Full fall precautions use walker/cane & assistance as needed  Disposition Home    Diet: Heart Healthy   Special Instructions: If you have smoked or chewed Tobacco  in the last 2 yrs please stop smoking, stop any regular Alcohol  and or any Recreational drug use.  On your next visit with your primary care physician please Get Medicines reviewed and adjusted.  Please request your Prim.MD to go over all Hospital Tests and Procedure/Radiological results at the follow up, please get all Hospital records sent to your Prim MD by signing hospital release before you go home.  If you experience worsening of your admission symptoms, develop shortness of breath, life threatening emergency, suicidal or homicidal thoughts you must seek medical attention immediately by calling 911 or calling your MD immediately  if symptoms less severe.  You Must read complete instructions/literature along with all the possible adverse reactions/side effects for all the Medicines you take and that have been prescribed to you. Take any new Medicines after you have completely understood and accpet all the possible adverse reactions/side effects.    ---------------------------------------------------------------------------------------------------------------------------------------------------------------------------- Information on my medicine - ELIQUIS (apixaban)  This medication education was reviewed with me or my healthcare representative as part of my discharge preparation.    Why was Eliquis prescribed for you? Eliquis was prescribed to treat blood clots that may have been found in the veins of your legs (deep vein thrombosis) or in your lungs (pulmonary embolism) and to reduce the risk of them occurring again.  What do You need to know about Eliquis ? The starting dose  is 10 mg (two 5 mg tablets) taken TWICE daily for the FIRST SEVEN (7) DAYS, then on Sunday, June 09, 2021  the dose is reduced to ONE 5 mg tablet taken TWICE daily.  Eliquis may be taken with or without food.   Try to take the dose about the same time in the morning and in the evening. If you have difficulty swallowing the tablet whole please discuss with your pharmacist how to take the medication safely.  Take Eliquis exactly as prescribed and DO NOT stop taking Eliquis without talking to the doctor who prescribed the medication.  Stopping may increase your risk of developing a new blood clot.  Refill your prescription before you run out.  After discharge, you should have regular check-up appointments with your healthcare provider that is prescribing your Eliquis.    What do you do if you miss a dose? If a dose of ELIQUIS is not taken at the scheduled time, take it as soon as possible on the same day and twice-daily administration should be resumed. The dose should not be doubled to make up for a missed dose.  Important Safety Information A possible side effect of Eliquis is bleeding. You should call your healthcare provider right away if you experience any of the following: Bleeding from an injury or your nose that does not stop. Unusual colored urine (red or dark brown) or unusual colored stools (red or black). Unusual bruising for unknown reasons. A serious fall or if you hit your head (even if there is no bleeding).  Some medicines may interact with Eliquis and might increase your risk of bleeding or clotting while on Eliquis. To help avoid this, consult your healthcare provider or pharmacist prior to  using any new prescription or non-prescription medications, including herbals, vitamins, non-steroidal anti-inflammatory drugs (NSAIDs) and supplements.  This website has more information on Eliquis (apixaban): http://www.eliquis.com/eliquis/home

## 2021-06-02 NOTE — Discharge Summary (Addendum)
Nicole Hughes EAV:[id number] DOB: 12/17/1970 DOA: 05/22/2021  PCP: Pcp, No  Admit date: 05/22/2021  Discharge date: 06/02/2021  Admitted From: Home   Disposition:  Home   Recommendations for Outpatient Follow-up:   Follow up with PCP in 1-2 weeks  PCP Please obtain BMP/CBC, 2 view CXR in 1week,  (see Discharge instructions)   PCP Please follow up on the following pending results: Please monitor CBC, BMP, magnesium levels closely needs outpatient follow-up with hematology and psych.   Home Health: None   Equipment/Devices: None  Consultations: Psych,  Discharge Condition: Stable    CODE STATUS: Full    Diet Recommendation: Heart Healthy   Diet Order             DIET SOFT Room service appropriate? Yes; Fluid consistency: Thin  Diet effective now                    Chief Complaint  Patient presents with   Chest Pain   Shortness of Breath   Nausea   Emesis   Dizziness     Brief history of present illness from the day of admission and additional interim summary    51 y.o.  female with history of EtOH use, GERD, HTN-who presented to the ED with epigastric pain-she was thought to have pancreatitis-alcohol withdrawal-and subsequently admitted to the hospitalist service.     Significant events: 5/17>> admit to [facility]-epigastric pain-EtOH withdrawal   Significant studies: 5/17>> CXR: No PNA 5/17>> CT abdomen/pelvis: No acute abnormality. 5/17>> lipase: 141 5/27 >> upper extremity vascular ultrasound.  Bilateral DVTs.   Significant microbiology data: 5/17>> COVID/influenza PCR: Negative   Procedures: None   Consults: Psych                                                                   Hospital Course    Epigastric pain-likely due to acute alcoholic pancreatitis: Diagnosed clinically  with epigastric pain, alcohol abuse and elevated lipase, CT scan of the abdomen pelvis was reassuring, strictly counseled to quit alcohol, pain is now completely resolved with conservative treatment of bowel rest and IV fluids, strictly counseled to quit alcohol and follow-up with PCP on a close basis.   Alcoholic hepatitis: Mild-supportive care.  CP to monitor closely.   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  Remove peripheral IVs, Lovenox full dose followed by Eliquis upon discharge, needs close outpatient hematology follow-up as bilateral upper extremity DVTs are quite unusual and she has previous history of DVT as well.  Duration and course of anticoagulation per PCP and hematology.  She will get 1 month Eliquis coupon from [place]   EtOH withdrawal: Completely resolved.   HTN: BP relatively stable-continue home regimen.   Hypokalemia, hypomagnesemia and hypophosphatemia: Repleted again and recheck by PCP  in 5 to 7 days.  Placed on oral magnesium supplementation due to recurrent hypomagnesemia.   Normocytic anemia: Chronic issue-watch closely.  Transfuse if Hb <7.  Probably due to bone marrow suppression from alcohol use.   Suicidal ideation night of 05/25/2021.  Psych called, continue suicidal precautions, per psych [facility] admission once medically cleared, per Psych ok to DC sitter.  Outpatient psych follow-up postdischarge.   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  Discharge diagnosis     Principal Problem:   Epigastric pain Active Problems:   Essential hypertension   Chronic alcohol abuse   Chronic anemia   Alcohol withdrawal (HCC)   Transaminitis    Discharge instructions      Discharge Medications   Allergies as of 06/02/2021   No Known Allergies      Medication List     STOP taking these medications    traZODone 50 MG tablet Commonly known as: DESYREL       TAKE these medications    acetaminophen  500 MG tablet Commonly known as: TYLENOL Take 1 tablet (500 mg total) by mouth every 8 (eight) hours as needed for moderate pain or mild pain.   amLODipine 10 MG tablet Commonly known as: NORVASC Take 1 tablet (10 mg total) by mouth daily.   apixaban 5 MG Tabs tablet Commonly known as: ELIQUIS Take 2 tablets (10 mg total) by mouth 2 (two) times daily for 6 days.   apixaban 5 MG Tabs tablet Commonly known as: ELIQUIS Take 1 tablet (5 mg total) by mouth 2 (two) times daily. Start taking on: June 09, 2021   CLEAR EYES FOR DRY EYES OP Place 1 drop into both eyes daily as needed (dryness).   colchicine 0.6 MG tablet Take 1 tablet (0.6 mg total) by mouth daily for 5 days.   lactulose (encephalopathy) 10 GM/15ML Soln Commonly known as: CHRONULAC Take 30 mLs (20 g total) by mouth 2 (two) times daily.   Magnesium Oxide 400 MG Caps Take 2 capsules (800 mg total) by mouth 2 (two) times daily.   metoprolol succinate 100 MG 24 hr tablet Commonly known as: TOPROL-XL TAKE 1 TABLET BY MOUTH ONCE DAILY WITH A MEAL OR IMMEDIATELY FOLLOWING A MEAL. APPOINTMENT REQUIRED FOR FUTURE REFILLS What changed: See the new instructions.   mirtazapine 15 MG tablet Commonly known as: REMERON Take 1 tablet (15 mg total) by mouth at bedtime.   pantoprazole 40 MG tablet Commonly known as: PROTONIX Take 1 tablet (40 mg total) by mouth daily.   traMADol 50 MG tablet Commonly known as: Ultram Take 1 tablet (50 mg total) by mouth every 12 (twelve) hours as needed.         Follow-up Information     [facility] Call.   Specialty: Behavioral Health Why: To setup appointment for intake for IOP programming (3 times a week/virtual). Contact information: [address] [id number] mc [place] [address] [phone]        [facility] Follow up.   Specialty: Behavioral Health Why: Also offers IOP programming Contact information: [address] [place] [address] [phone]         [facility]. Schedule an appointment as soon as possible for a visit in 1 week(s).   Contact information: [address] [place] [address] [phone]        Artis Delay, MD. Schedule an appointment as soon as possible  for a visit in 1 week(s).   Specialty: Hematology and Oncology Contact information: [address] [place] [phone]                 Major procedures and Radiology Reports - PLEASE review detailed and final reports thoroughly  -      CT Abdomen Pelvis W Contrast  Result Date: 05/22/2021 CLINICAL DATA:  Acute abdominal pain with elevated lipase, initial encounter EXAM: CT ABDOMEN AND PELVIS WITH CONTRAST TECHNIQUE: Multidetector CT imaging of the abdomen and pelvis was performed using the standard protocol following bolus administration of intravenous contrast. RADIATION DOSE REDUCTION: This exam was performed according to the departmental dose-optimization program which includes automated exposure control, adjustment of the mA and/or kV according to patient size and/or use of iterative reconstruction technique. CONTRAST:  75 mL Omnipaque 350. COMPARISON:  07/08/2020 FINDINGS: Lower chest: Mild dependent atelectatic changes are noted. No focal infiltrate is seen. Hepatobiliary: Fatty infiltration of the liver is noted. The gallbladder is well distended without evidence of cholelithiasis. No biliary ductal dilatation is seen. Pancreas: Unremarkable. No pancreatic ductal dilatation or surrounding inflammatory changes. Spleen: Normal in size without focal abnormality. Adrenals/Urinary Tract: Adrenal glands are within normal limits. Kidneys show normal enhancement pattern bilaterally. No renal calculi or obstructive changes are seen. Normal excretion is noted on delayed images. Bladder is partially distended.  Stomach/Bowel: The appendix is within normal limits. No obstructive or inflammatory changes of the colon are seen. Stomach is within normal limits. Small bowel shows no focal abnormality. Vascular/Lymphatic: Aortic atherosclerosis. No enlarged abdominal or pelvic lymph nodes. Reproductive: Uterus and bilateral adnexa are unremarkable. Other: No abdominal wall hernia or abnormality. No abdominopelvic ascites. Musculoskeletal: No acute or significant osseous findings. IMPRESSION: Fatty liver. No acute abnormality is noted. Specifically the pancreas is within normal limits. Electronically Signed   By: Alcide CleverMark  Lukens M.D.   On: 05/22/2021 19:36   DG Chest Port 1 View  Result Date: 05/22/2021 CLINICAL DATA:  cp EXAM: PORTABLE CHEST 1 VIEW COMPARISON:  December 16, 2020 FINDINGS: The cardiomediastinal silhouette is unchanged in contour.Unchanged elevation of the RIGHT hemidiaphragm. No pleural effusion. No pneumothorax. No acute pleuroparenchymal abnormality. Visualized abdomen is unremarkable. IMPRESSION: No acute cardiopulmonary abnormality. Electronically Signed   By: Meda KlinefelterStephanie  Peacock M.D.   On: 05/22/2021 18:54   DG Abd Portable 1V  Result Date: 05/26/2021 CLINICAL DATA:  Nausea and emesis.  Right-sided abdominal pain. EXAM: PORTABLE ABDOMEN - 1 VIEW COMPARISON:  CT scan of the abdomen and pelvis May 22, 2021 FINDINGS: Air seen within nondilated colon. There is a paucity of small bowel gas. No evidence of obstruction on today's study. No free air, portal venous gas, or pneumatosis identified on this single supine image. Both kidneys are obscured by bowel contents but no renal or ureteral stones are identified. No other acute abnormalities. IMPRESSION: Negative. Electronically Signed   By: Gerome Samavid  Williams III M.D.   On: 05/26/2021 09:24   VAS US UPPER EXTREMITY VENOUS DUPLEX  Result Date: 06/01/2021 UPPER VENOUS STUDY  Patient Name:  Nicole Hughes  Date of Exam:   06/01/2021 Medical Rec #: [id number]          Accession #:    [id number] Date of Birth: Oct 18, 1970         Patient Gender: F Patient Age:   [age] years [facility] Procedure:      VAS US UPPER EXTREMITY VENOUS DUPLEX Referring Phys: Bess [facility]PRASHANT [facility] --------------------------------------------------------------------------------  Indications: Pain in bilateral arms, history of DVT/PE, multiple IVs Comparison Study: No prior studies available. Performing Technologist: Jean Rosenthal RDMS, RVT  Examination Guidelines: A complete evaluation includes B-mode imaging, spectral Doppler, color Doppler, and power Doppler as needed of all accessible portions of each vessel. Bilateral testing is considered an integral part of a complete examination. Limited examinations for reoccurring indications may be performed as noted.  Right Findings: +----------+------------+---------+-----------+------------------------+-------+ RIGHT     CompressiblePhasicitySpontaneous       Properties       Summary +----------+------------+---------+-----------+------------------------+-------+ IJV           Full       Yes       Yes                                    +----------+------------+---------+-----------+------------------------+-------+ Subclavian               Yes       Yes                                    +----------+------------+---------+-----------+------------------------+-------+ Axillary      Full       Yes       Yes                                    +----------+------------+---------+-----------+------------------------+-------+ Brachial      Full                                                        +----------+------------+---------+-----------+------------------------+-------+ Radial        Full                                                        +----------+------------+---------+-----------+------------------------+-------+ Ulnar         Full                                                         +----------+------------+---------+-----------+------------------------+-------+ Cephalic    Partial      Yes       Yes     Hypervascularity noted  Acute                                            surrounding vein at mid                                                       forearm, unknown  significance/etiology          +----------+------------+---------+-----------+------------------------+-------+ Basilic       Full                                                        +----------+------------+---------+-----------+------------------------+-------+  Left Findings: +----------+------------+---------+-----------+------------------------+-------+ LEFT      CompressiblePhasicitySpontaneous       Properties       Summary +----------+------------+---------+-----------+------------------------+-------+ IJV           Full       Yes       Yes                                    +----------+------------+---------+-----------+------------------------+-------+ Subclavian    Full       Yes       Yes                                    +----------+------------+---------+-----------+------------------------+-------+ Axillary      Full       Yes       Yes                                    +----------+------------+---------+-----------+------------------------+-------+ Brachial      Full                                                        +----------+------------+---------+-----------+------------------------+-------+ Radial        None       No        No       Single paired vein,    Acute                                                proximal forearm            +----------+------------+---------+-----------+------------------------+-------+ Ulnar         Full                                                         +----------+------------+---------+-----------+------------------------+-------+ Cephalic      Full                                                        +----------+------------+---------+-----------+------------------------+-------+ Basilic       Full                                                        +----------+------------+---------+-----------+------------------------+-------+  Summary:  Right: No evidence of deep vein thrombosis in the upper extremity. Findings consistent with acute superficial vein thrombosis involving the right cephalic vein.  Left: No evidence of superficial vein thrombosis in the upper extremity. Findings consistent with acute deep vein thrombosis involving a single paired left radial vein at the level of the proximal forearm.  *See table(s) above for measurements and observations.     Preliminary     Today   Subjective    Dawnn Nam today has no headache,no chest abdominal pain,no new weakness tingling or numbness, feels much better wants to go home today. Mild bilat arm pain.   Objective   Blood pressure (!) 113/54, pulse 79, temperature 99 F (37.2 C), temperature source Oral, resp. rate 16, height 5\' 3"  (1.6 m), weight 61.1 kg, SpO2 98 %.   Intake/Output Summary (Last 24 hours) at 06/02/2021 1304 Last data filed at 06/01/2021 1637 Gross per 24 hour  Intake 480 ml  Output --  Net 480 ml    Exam  Awake Alert, No new F.N deficits,    [place].AT,PERRAL Supple Neck,   Symmetrical Chest wall movement, Good air movement bilaterally, CTAB RRR,No Gallops,   +ve B.Sounds, Abd Soft, Non tender,  No Cyanosis, Clubbing or edema    Data Review   Recent Labs  Lab 05/29/21 0120 05/30/21 0410 05/31/21 0133 06/01/21 0409 06/02/21 0106  WBC 3.0* 3.2* 2.7* 3.2* 3.4*  HGB 8.1* 8.4* 7.9* 8.2* 8.2*  HCT 25.2* 26.8* 25.5* 26.7* 26.2*  PLT 113* 133* 108* 105* 102*  MCV 98.1 97.1 98.5 98.2 96.7  MCH 31.5 30.4 30.5 30.1 30.3  MCHC 32.1 31.3 31.0  30.7 31.3  RDW 20.6* 20.2* 20.0* 19.7* 19.2*  LYMPHSABS 0.9 1.2 1.0 1.2 1.1  MONOABS 0.5 0.5 0.5 0.5 0.5  EOSABS 0.1 0.1 0.1 0.1 0.1  BASOSABS 0.0 0.0 0.0 0.0 0.0    Recent Labs  Lab 05/27/21 0206 05/28/21 0237 05/29/21 0120 05/30/21 0410 05/31/21 0133 06/01/21 0409 06/02/21 0106  NA 140 139 138 138 139 140 136  K 4.1 4.0 3.7 3.1* 4.3 4.1 3.8  CL 110 111 111 108 110 109 109  CO2 24 23 22 22  21* 24 24  GLUCOSE 81 97 119* 81 80 93 97  BUN <5* <5* <5* <5* <5* 5* 5*  CREATININE 0.57 0.63 0.63 0.43* 0.46 0.54 0.54  CALCIUM 8.2* 8.1* 8.3* 8.4* 8.5* 8.6* 8.3*  AST 118* 101* 86* 82* 76* 77* 70*  ALT 31 28 25 23 22 22 21   ALKPHOS 71 63 64 62 54 56 55  BILITOT 1.7* 1.5* 1.1 1.8* 1.4* 1.0 1.1  ALBUMIN 2.9* 2.5* 2.6* 2.8* 2.6* 2.5* 2.6*  MG 1.5* 1.4* 1.5* 1.6* 1.7 1.5* 1.6*  PHOS 2.4* 2.7 2.2* 5.0* 3.6 3.7 3.9  BNP 123.8* 89.9  --   --   --   --   --     Total Time in preparing paper work, data evaluation and todays exam - 35 minutes  06/04/21 M.D on 06/02/2021 at 1:04 PM  [facility]

## 2021-06-02 NOTE — TOC Transition Note (Signed)
Transition of Care [facility]) - CM/SW Discharge Note   Patient Details  Name: Nicole Hughes MRN: [id number] Date of Birth: 23-Nov-1970  Transition of Care [facility]) CM/SW Contact:  Lawerance Sabal, RN Phone Number: 06/02/2021, 9:47 AM   Clinical Narrative:    Patient provided with 30 day and copay reduction card for Eliquis    Final next level of care: Home/Self Care Barriers to Discharge: Continued Medical Work up   Patient Goals and CMS Choice Patient states their goals for this hospitalization and ongoing recovery are:: ETOH rehab      Discharge Placement                       Discharge Plan and Services In-house Referral: Clinical Social Work                                   Social Determinants of Health (SDOH) Interventions     Readmission Risk Interventions    12/20/2020   10:30 AM  Readmission Risk Prevention Plan  Transportation Screening Complete  PCP or Specialist Appt within 3-5 Days Complete  HRI or Home Care Consult Complete  Social Work Consult for Recovery Care Planning/Counseling Not Complete  SW consult not completed comments insurance doesn't cover  Palliative Care Screening Not Applicable  Medication Review [employer]) Complete

## 2021-07-03 ENCOUNTER — Inpatient Hospital Stay: Payer: BC Managed Care – PPO | Admitting: Physician Assistant

## 2021-07-30 ENCOUNTER — Emergency Department (HOSPITAL_COMMUNITY): Payer: BC Managed Care – PPO

## 2021-07-30 ENCOUNTER — Other Ambulatory Visit: Payer: Self-pay

## 2021-07-30 ENCOUNTER — Encounter (HOSPITAL_COMMUNITY): Payer: Self-pay | Admitting: Emergency Medicine

## 2021-07-30 ENCOUNTER — Emergency Department (HOSPITAL_COMMUNITY)
Admission: EM | Admit: 2021-07-30 | Discharge: 2021-07-30 | Disposition: A | Discharge status: Home / Self Care | Payer: BC Managed Care – PPO | Attending: Emergency Medicine | Admitting: Emergency Medicine

## 2021-07-30 DIAGNOSIS — I10 Essential (primary) hypertension: Secondary | ICD-10-CM | POA: Diagnosis not present

## 2021-07-30 DIAGNOSIS — Z79899 Other long term (current) drug therapy: Secondary | ICD-10-CM | POA: Insufficient documentation

## 2021-07-30 DIAGNOSIS — K760 Fatty (change of) liver, not elsewhere classified: Secondary | ICD-10-CM | POA: Diagnosis not present

## 2021-07-30 DIAGNOSIS — R0602 Shortness of breath: Secondary | ICD-10-CM | POA: Diagnosis not present

## 2021-07-30 DIAGNOSIS — R079 Chest pain, unspecified: Secondary | ICD-10-CM

## 2021-07-30 DIAGNOSIS — R109 Unspecified abdominal pain: Secondary | ICD-10-CM | POA: Insufficient documentation

## 2021-07-30 DIAGNOSIS — Y904 Blood alcohol level of 80-99 mg/100 ml: Secondary | ICD-10-CM | POA: Insufficient documentation

## 2021-07-30 DIAGNOSIS — F10239 Alcohol dependence with withdrawal, unspecified: Secondary | ICD-10-CM | POA: Diagnosis not present

## 2021-07-30 DIAGNOSIS — K209 Esophagitis, unspecified without bleeding: Secondary | ICD-10-CM | POA: Insufficient documentation

## 2021-07-30 DIAGNOSIS — Z7901 Long term (current) use of anticoagulants: Secondary | ICD-10-CM | POA: Diagnosis not present

## 2021-07-30 DIAGNOSIS — F10230 Alcohol dependence with withdrawal, uncomplicated: Secondary | ICD-10-CM | POA: Insufficient documentation

## 2021-07-30 DIAGNOSIS — R072 Precordial pain: Secondary | ICD-10-CM | POA: Diagnosis not present

## 2021-07-30 DIAGNOSIS — R0789 Other chest pain: Secondary | ICD-10-CM | POA: Diagnosis not present

## 2021-07-30 DIAGNOSIS — K828 Other specified diseases of gallbladder: Secondary | ICD-10-CM | POA: Diagnosis not present

## 2021-07-30 LAB — URINALYSIS, ROUTINE W REFLEX MICROSCOPIC
Bilirubin Urine: NEGATIVE
Glucose, UA: NEGATIVE mg/dL
Hgb urine dipstick: NEGATIVE
Ketones, ur: 5 mg/dL — AB
Leukocytes,Ua: NEGATIVE
Nitrite: NEGATIVE
Protein, ur: NEGATIVE mg/dL
Specific Gravity, Urine: >1.046 — ABNORMAL HIGH (ref 1.005–1.030)
pH: 7 (ref 5.0–8.0)

## 2021-07-30 LAB — CBC WITH DIFFERENTIAL/PLATELET
Abs Immature Granulocytes: 0.02 10*3/uL (ref 0.00–0.07)
Basophils Absolute: 0.1 10*3/uL (ref 0.0–0.1)
Basophils Relative: 1 %
Eosinophils Absolute: 0 10*3/uL (ref 0.0–0.5)
Eosinophils Relative: 1 %
HCT: 31.5 % — ABNORMAL LOW (ref 36.0–46.0)
Hemoglobin: 9.5 g/dL — ABNORMAL LOW (ref 12.0–15.0)
Immature Granulocytes: 1 %
Lymphocytes Relative: 28 %
Lymphs Abs: 1.1 10*3/uL (ref 0.7–4.0)
MCH: 23.9 pg — ABNORMAL LOW (ref 26.0–34.0)
MCHC: 30.2 g/dL (ref 30.0–36.0)
MCV: 79.1 fL — ABNORMAL LOW (ref 80.0–100.0)
Monocytes Absolute: 0.3 10*3/uL (ref 0.1–1.0)
Monocytes Relative: 7 %
Neutro Abs: 2.6 10*3/uL (ref 1.7–7.7)
Neutrophils Relative %: 62 %
Platelets: 135 10*3/uL — ABNORMAL LOW (ref 150–400)
RBC: 3.98 MIL/uL (ref 3.87–5.11)
RDW: 18.4 % — ABNORMAL HIGH (ref 11.5–15.5)
WBC: 4.1 10*3/uL (ref 4.0–10.5)
nRBC: 0 % (ref 0.0–0.2)

## 2021-07-30 LAB — I-STAT BETA HCG BLOOD, ED (MC, WL, AP ONLY): I-stat hCG, quantitative: <5 m[IU]/mL (ref <5)

## 2021-07-30 LAB — COMPREHENSIVE METABOLIC PANEL
ALT: 61 U/L — ABNORMAL HIGH (ref 0–44)
AST: 212 U/L — ABNORMAL HIGH (ref 15–41)
Albumin: 3.9 g/dL (ref 3.5–5.0)
Alkaline Phosphatase: 59 U/L (ref 38–126)
Anion gap: 13 (ref 5–15)
BUN: 7 mg/dL (ref 6–20)
CO2: 24 mmol/L (ref 22–32)
Calcium: 8.8 mg/dL — ABNORMAL LOW (ref 8.9–10.3)
Chloride: 101 mmol/L (ref 98–111)
Creatinine, Ser: 0.75 mg/dL (ref 0.44–1.00)
GFR, Estimated: >60 mL/min (ref >60)
Glucose, Bld: 104 mg/dL — ABNORMAL HIGH (ref 70–99)
Potassium: 4 mmol/L (ref 3.5–5.1)
Sodium: 138 mmol/L (ref 135–145)
Total Bilirubin: 1.5 mg/dL — ABNORMAL HIGH (ref 0.3–1.2)
Total Protein: 7.8 g/dL (ref 6.5–8.1)

## 2021-07-30 LAB — LIPASE, BLOOD: Lipase: 72 U/L — ABNORMAL HIGH (ref 11–51)

## 2021-07-30 LAB — ETHANOL: Alcohol, Ethyl (B): 82 mg/dL — ABNORMAL HIGH (ref <10)

## 2021-07-30 LAB — MAGNESIUM: Magnesium: 1.5 mg/dL — ABNORMAL LOW (ref 1.7–2.4)

## 2021-07-30 LAB — TROPONIN I (HIGH SENSITIVITY)
Troponin I (High Sensitivity): 4 ng/L (ref <18)
Troponin I (High Sensitivity): 4 ng/L (ref <18)

## 2021-07-30 MED ORDER — CHLORDIAZEPOXIDE HCL 25 MG PO CAPS: ORAL_CAPSULE | ORAL | 0 refills | Status: DC

## 2021-07-30 MED ORDER — OXYCODONE-ACETAMINOPHEN 5-325 MG PO TABS
1.0000 | ORAL_TABLET | Freq: Once | ORAL | Status: AC
  Administered 2021-07-30: 1 via ORAL
  Filled 2021-07-30: qty 1

## 2021-07-30 MED ORDER — IOHEXOL 350 MG/ML SOLN
100.0000 mL | Freq: Once | INTRAVENOUS | Status: AC | PRN
  Administered 2021-07-30: 100 mL via INTRAVENOUS

## 2021-07-30 MED ORDER — ONDANSETRON HCL 4 MG/2ML IJ SOLN
4.0000 mg | Freq: Once | INTRAMUSCULAR | Status: AC
  Administered 2021-07-30: 4 mg via INTRAVENOUS
  Filled 2021-07-30: qty 2

## 2021-07-30 MED ORDER — LORAZEPAM 2 MG/ML IJ SOLN
2.0000 mg | Freq: Once | INTRAMUSCULAR | Status: AC
  Administered 2021-07-30: 2 mg via INTRAVENOUS
  Filled 2021-07-30: qty 1

## 2021-07-30 MED ORDER — MAGNESIUM SULFATE 2 GM/50ML IV SOLN
2.0000 g | Freq: Once | INTRAVENOUS | Status: AC
  Administered 2021-07-30: 2 g via INTRAVENOUS
  Filled 2021-07-30: qty 50

## 2021-07-30 MED ORDER — FAMOTIDINE IN NACL 20-0.9 MG/50ML-% IV SOLN
20.0000 mg | Freq: Once | INTRAVENOUS | Status: AC
  Administered 2021-07-30: 20 mg via INTRAVENOUS
  Filled 2021-07-30: qty 50

## 2021-07-30 MED ORDER — PANTOPRAZOLE SODIUM 40 MG PO TBEC: 40.0000 mg | DELAYED_RELEASE_TABLET | Freq: Every day | ORAL | 0 refills | Status: AC

## 2021-07-30 MED ORDER — SODIUM CHLORIDE 0.9 % IV BOLUS
1000.0000 mL | Freq: Once | INTRAVENOUS | Status: AC
  Administered 2021-07-30: 1000 mL via INTRAVENOUS

## 2021-07-30 NOTE — ED Triage Notes (Signed)
Pt arrives via POV from home with CP pain worse with inspiration. Reports numbness/tingling allover. Reports lightheaded. Pt reports she is an alcoholic. Last drink around 9:30am. Pt awake, alert appropriate.

## 2021-07-30 NOTE — ED Provider Triage Note (Signed)
Emergency Medicine Provider Triage Evaluation Note  ARLYS SCATENA , a 51 y.o. female  was evaluated in triage.  Pt complains of chest and abdominal pain. She has a history of PEs.  She states that she was diagnosed with a upper extremity DVT, review shows that on 5/27 she was diagnosed with a DVT of the left radial vein in the forearm.  She reports compliance with her anticoagulation. Her last drink was about 10 this morning.  She does not feel like she is in withdrawal. Her chest and abdominal pain was sudden onset.  Started last night and has persisted.  States that she feels very short of breath.   Physical Exam  BP (!) 149/83 (BP Location: Right Arm)   Pulse 84   Temp 98.8 F (37.1 C) (Oral)   Resp 16   Ht 5\' 3"  (1.6 m)   Wt 60.3 kg   SpO2 96%   BMI 23.56 kg/m  Gen:   Awake, appears uncomfortable Resp:  Normal effort lung sounds present bilaterally. MSK:   Moves extremities without difficulty  Other:  Regular rate and rhythm.  No significant tremor  Medical Decision Making  Medically screening exam initiated at 4:15 PM.  Appropriate orders placed.  VASSIE KUGEL was informed that the remainder of the evaluation will be completed by another provider, this initial triage assessment does not replace that evaluation, and the importance of remaining in the ED until their evaluation is complete.  Patient with complex history including upper extremity DVT anticoagulated with sudden onset chest pain, history of pancreatitis, with significant abdominal pain at this time. We will obtain CTA PE study, in addition to a CT scan of her abdomen and pelvis.  Labs are ordered.  She does not appear to be in acute significant alcohol withdrawal at this time.   [name], Cristina Gong 07/30/21 1619

## 2021-07-30 NOTE — Discharge Instructions (Addendum)
You were seen in the emergency department for upper chest pain.  You had blood work EKG chest x-ray and a CAT scan of your chest that did not show any significant findings.  This is likely acid reflux.  Please abstain from alcohol.  We are putting you on a Librium taper and restarting your acid medication.  You can use Maalox in between meals and at bedtime.  We are also giving you a list of detox facilities.  Return to the emergency department if any worsening or concerning symptoms

## 2021-07-30 NOTE — ED Provider Notes (Signed)
[facility] Provider Note   CSN: [id number] Arrival date & time: 07/30/21  1511     History {Add pertinent medical, surgical, social history, OB history to HPI:1} Chief Complaint  Patient presents with   Chest Pain    Nicole Hughes is a 51 y.o. female.  She is complaining of substernal chest pain shortness of breath and difficulty swallowing started around 9 this morning.  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  She has been trying to taper her alcohol use and is currently drinking 1 pint today.  Last drink was this morning.  Feeling dizzy lightheaded.  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  On Eliquis.  The history is provided by the patient.  Chest Pain Pain location:  Substernal area Pain quality: aching   Pain severity:  Severe Onset quality:  Gradual Duration:  1 day Timing:  Constant Progression:  Unchanged Chronicity:  Recurrent Relieved by:  Nothing Exacerbated by: Swallowing movement. Ineffective treatments:  None tried Associated symptoms: dizziness, dysphagia and shortness of breath   Associated symptoms: no abdominal pain, no cough, no diaphoresis and no fever   Risk factors: hypertension and prior DVT/PE   Risk factors: no smoking        Home Medications Prior to Admission medications   Medication Sig Start Date End Date Taking? Authorizing Provider  acetaminophen (TYLENOL) 500 MG tablet Take 1 tablet (500 mg total) by mouth every 8 (eight) hours as needed for moderate pain or mild pain. 06/02/21 06/02/22  Leroy SeaSingh, Prashant K, MD  amLODipine (NORVASC) 10 MG tablet Take 1 tablet (10 mg total) by mouth daily. 09/05/20   Tower, Audrie GallusMarne A, MD  apixaban (ELIQUIS) 5 MG TABS tablet Take 2 tablets (10 mg total) by mouth 2 (two) times daily for 6 days. 06/02/21 06/08/21  Leroy SeaSingh, Prashant K, MD   apixaban (ELIQUIS) 5 MG TABS tablet Take 1 tablet (5 mg total) by mouth 2 (two) times daily. 06/09/21   Leroy SeaSingh, Prashant K, MD  Carboxymethylcellul-Glycerin (CLEAR EYES FOR DRY EYES OP) Place 1 drop into both eyes daily as needed (dryness).    [provider]  colchicine 0.6 MG tablet Take 1 tablet (0.6 mg total) by mouth daily for 5 days. 06/02/21 06/07/21  Leroy SeaSingh, Prashant K, MD  lactulose, encephalopathy, (CHRONULAC) 10 GM/15ML SOLN Take 30 mLs (20 g total) by mouth 2 (two) times daily. 12/20/20   Marinda ElkFeliz Ortiz, Abraham, MD  Magnesium Oxide 400 MG CAPS Take 2 capsules (800 mg total) by mouth 2 (two) times daily. 06/02/21   Leroy SeaSingh, Prashant K, MD  metoprolol succinate (TOPROL-XL) 100 MG 24 hr tablet TAKE 1 TABLET BY MOUTH ONCE DAILY WITH A MEAL OR IMMEDIATELY FOLLOWING A MEAL. APPOINTMENT REQUIRED FOR FUTURE REFILLS Patient taking differently: Take 100 mg by mouth daily. 05/09/21   Tower, Audrie GallusMarne A, MD  mirtazapine (REMERON) 15 MG tablet Take 1 tablet (15 mg total) by mouth at bedtime. 06/02/21   Leroy SeaSingh, Prashant K, MD  pantoprazole (PROTONIX) 40 MG tablet Take 1 tablet (40 mg total) by mouth daily. 06/02/21   Leroy SeaSingh, Prashant K, MD  traMADol (ULTRAM) 50 MG tablet Take 1 tablet (50 mg total) by mouth every 12 (twelve) hours as needed. 06/02/21 06/02/22  Leroy SeaSingh, Prashant K, MD      Allergies    Patient has no known allergies.  Review of Systems   Review of Systems  Constitutional:  Negative for diaphoresis and fever.  HENT:  Positive for trouble swallowing.   Eyes:  Negative for visual disturbance.  Respiratory:  Positive for shortness of breath. Negative for cough.   Cardiovascular:  Positive for chest pain.  Gastrointestinal:  Negative for abdominal pain.  Neurological:  Positive for dizziness.    Physical Exam Updated Vital Signs BP (!) 148/82 (BP Location: Right Arm)   Pulse 83   Temp 98.5 F (36.9 C) (Oral)   Resp 17   Ht 5\' 3"  (1.6 m)   Wt 60.3 kg   SpO2 98%   BMI 23.56 kg/m   Physical Exam Vitals and nursing note reviewed.  Constitutional:      General: She is not in acute distress.    Appearance: She is well-developed.  HENT:     Head: Normocephalic and atraumatic.  Eyes:     Conjunctiva/sclera: Conjunctivae normal.  Cardiovascular:     Rate and Rhythm: Normal rate and regular rhythm.     Heart sounds: Normal heart sounds. No murmur heard. Pulmonary:     Effort: Pulmonary effort is normal. No respiratory distress.     Breath sounds: Normal breath sounds.  Abdominal:     Palpations: Abdomen is soft.     Tenderness: There is no abdominal tenderness. There is no guarding or rebound.  Musculoskeletal:        General: No swelling. Normal range of motion.     Cervical back: Neck supple.     Right lower leg: No tenderness. No edema.     Left lower leg: No tenderness. No edema.  Skin:    General: Skin is warm and dry.     Capillary Refill: Capillary refill takes less than 2 seconds.  Neurological:     General: No focal deficit present.     Mental Status: She is alert.     ED Results / Procedures / Treatments   Labs (all labs ordered are listed, but only abnormal results are displayed) Labs Reviewed  CBC WITH DIFFERENTIAL/PLATELET - Abnormal; Notable for the following components:      Result Value   Hemoglobin 9.5 (*)    HCT 31.5 (*)    MCV 79.1 (*)    MCH 23.9 (*)    RDW 18.4 (*)    Platelets 135 (*)    All other components within normal limits  ETHANOL - Abnormal; Notable for the following components:   Alcohol, Ethyl (B) 82 (*)    All other components within normal limits  LIPASE, BLOOD - Abnormal; Notable for the following components:   Lipase 72 (*)    All other components within normal limits  COMPREHENSIVE METABOLIC PANEL - Abnormal; Notable for the following components:   Glucose, Bld 104 (*)    Calcium 8.8 (*)    AST 212 (*)    ALT 61 (*)    Total Bilirubin 1.5 (*)    All other components within normal limits  MAGNESIUM -  Abnormal; Notable for the following components:   Magnesium 1.5 (*)    All other components within normal limits  URINALYSIS, ROUTINE W REFLEX MICROSCOPIC  I-STAT BETA HCG BLOOD, ED ([facility], [facility], [facility] ONLY)  TROPONIN I (HIGH SENSITIVITY)  TROPONIN I (HIGH SENSITIVITY)    EKG EKG Interpretation  Date/Time:  Tuesday July 30 2021 15:28:56 EDT Ventricular Rate:  81 PR Interval:  150 QRS Duration: 68 QT Interval:  412 QTC Calculation: 478 R  Axis:   5 Text Interpretation: Normal sinus rhythm Cannot rule out Anterior infarct , age undetermined Abnormal ECG When compared with ECG of 30-May-2021 13:42, No significant change since last tracing Confirmed by Meridee Score [phone]) on 07/30/2021 9:22:42 PM  Radiology CT Angio Chest Pulmonary Embolism (PE) W or WO Contrast  Result Date: 07/30/2021 CLINICAL DATA:  Pulmonary embolism (PE) suspected, high prob Recent DVT of upper extremity, sudden onset upper chest pain and shortness of breath last night, EXAM: CT ANGIOGRAPHY CHEST CT ABDOMEN AND PELVIS WITH CONTRAST TECHNIQUE: Multidetector CT imaging of the chest was performed using the standard protocol during bolus administration of intravenous contrast. Multiplanar CT image reconstructions and MIPs were obtained to evaluate the vascular anatomy. Multidetector CT imaging of the abdomen and pelvis was performed using the standard protocol during bolus administration of intravenous contrast. RADIATION DOSE REDUCTION: This exam was performed according to the departmental dose-optimization program which includes automated exposure control, adjustment of the mA and/or kV according to patient size and/or use of iterative reconstruction technique. CONTRAST:  OMNIPAQUE IOHEXOL 350 MG/ML SOLN COMPARISON:  None Available. FINDINGS: CTA CHEST FINDINGS Cardiovascular: Satisfactory opacification of the pulmonary arteries to the segmental level. No evidence of pulmonary embolism. The main pulmonary artery is normal in  caliber. Normal heart size. No significant pericardial effusion. The thoracic aorta is normal in caliber. No atherosclerotic plaque of the thoracic aorta. Replaced left vertebral artery originating from the aortic arch. Left anterior descending coronary artery calcifications. Mediastinum/Nodes: No enlarged mediastinal, hilar, or axillary lymph nodes. Thyroid gland, trachea, and esophagus demonstrate no significant findings. Lungs/Pleura: No focal consolidation. No pulmonary nodule. No pulmonary mass. No pleural effusion. No pneumothorax. Musculoskeletal: No chest wall abnormality. No suspicious lytic or blastic osseous lesions. No acute displaced fracture. Cystic lesion of the left humerus appears benign in etiology. Review of the MIP images confirms the above findings. CT ABDOMEN and PELVIS FINDINGS Hepatobiliary: The liver is enlarged measuring up to 20 cm. The hepatic parenchyma is diffusely hypodense compared to the splenic parenchyma consistent with fatty infiltration. No focal liver abnormality. Gallbladder wall thickening. No CT evidence of gallstones or pericholecystic fluid. No biliary dilatation. Pancreas: No focal lesion. Normal pancreatic contour. No surrounding inflammatory changes. No main pancreatic ductal dilatation. Spleen: Normal in size without focal abnormality. Adrenals/Urinary Tract: No adrenal nodule bilaterally. Bilateral kidneys enhance and excrete symmetrically. No hydronephrosis. No hydroureter. The urinary bladder is unremarkable. Stomach/Bowel: Stomach is within normal limits. No evidence of bowel wall thickening or dilatation. Appendix appears normal. Vascular/Lymphatic: No abdominal aorta or iliac aneurysm. Moderate atherosclerotic plaque of the aorta and its branches. No abdominal, pelvic, or inguinal lymphadenopathy. Reproductive: Uterus and bilateral adnexa are unremarkable. Other: Trace volume free fluid within the rectouterine pouch. No intraperitoneal free gas. No organized fluid  collection. Musculoskeletal: No chest wall abnormality. No suspicious lytic or blastic osseous lesions. No acute displaced fracture. Grade 1 anterolisthesis of L4 on L5. Review of the MIP images confirms the above findings. IMPRESSION: 1. No pulmonary embolus. 2. No acute intrathoracic abnormality. 3. Hepatomegaly and hepatic steatosis. 4. Nonspecific gallbladder wall thickening with no CT evidence of gallstones. Consider right upper quadrant ultrasound for further evaluation. 5. Aortic Atherosclerosis (ICD10-I70.0) including left anterior descending coronary calcification. Electronically Signed   By: Tish Frederickson M.D.   On: 07/30/2021 21:01   CT ABDOMEN PELVIS W CONTRAST  Result Date: 07/30/2021 CLINICAL DATA:  Pulmonary embolism (PE) suspected, high prob Recent DVT of upper extremity, sudden onset upper chest pain and  shortness of breath last night, EXAM: CT ANGIOGRAPHY CHEST CT ABDOMEN AND PELVIS WITH CONTRAST TECHNIQUE: Multidetector CT imaging of the chest was performed using the standard protocol during bolus administration of intravenous contrast. Multiplanar CT image reconstructions and MIPs were obtained to evaluate the vascular anatomy. Multidetector CT imaging of the abdomen and pelvis was performed using the standard protocol during bolus administration of intravenous contrast. RADIATION DOSE REDUCTION: This exam was performed according to the departmental dose-optimization program which includes automated exposure control, adjustment of the mA and/or kV according to patient size and/or use of iterative reconstruction technique. CONTRAST:  OMNIPAQUE IOHEXOL 350 MG/ML SOLN COMPARISON:  None Available. FINDINGS: CTA CHEST FINDINGS Cardiovascular: Satisfactory opacification of the pulmonary arteries to the segmental level. No evidence of pulmonary embolism. The main pulmonary artery is normal in caliber. Normal heart size. No significant pericardial effusion. The thoracic aorta is normal in  caliber. No atherosclerotic plaque of the thoracic aorta. Replaced left vertebral artery originating from the aortic arch. Left anterior descending coronary artery calcifications. Mediastinum/Nodes: No enlarged mediastinal, hilar, or axillary lymph nodes. Thyroid gland, trachea, and esophagus demonstrate no significant findings. Lungs/Pleura: No focal consolidation. No pulmonary nodule. No pulmonary mass. No pleural effusion. No pneumothorax. Musculoskeletal: No chest wall abnormality. No suspicious lytic or blastic osseous lesions. No acute displaced fracture. Cystic lesion of the left humerus appears benign in etiology. Review of the MIP images confirms the above findings. CT ABDOMEN and PELVIS FINDINGS Hepatobiliary: The liver is enlarged measuring up to 20 cm. The hepatic parenchyma is diffusely hypodense compared to the splenic parenchyma consistent with fatty infiltration. No focal liver abnormality. Gallbladder wall thickening. No CT evidence of gallstones or pericholecystic fluid. No biliary dilatation. Pancreas: No focal lesion. Normal pancreatic contour. No surrounding inflammatory changes. No main pancreatic ductal dilatation. Spleen: Normal in size without focal abnormality. Adrenals/Urinary Tract: No adrenal nodule bilaterally. Bilateral kidneys enhance and excrete symmetrically. No hydronephrosis. No hydroureter. The urinary bladder is unremarkable. Stomach/Bowel: Stomach is within normal limits. No evidence of bowel wall thickening or dilatation. Appendix appears normal. Vascular/Lymphatic: No abdominal aorta or iliac aneurysm. Moderate atherosclerotic plaque of the aorta and its branches. No abdominal, pelvic, or inguinal lymphadenopathy. Reproductive: Uterus and bilateral adnexa are unremarkable. Other: Trace volume free fluid within the rectouterine pouch. No intraperitoneal free gas. No organized fluid collection. Musculoskeletal: No chest wall abnormality. No suspicious lytic or blastic osseous  lesions. No acute displaced fracture. Grade 1 anterolisthesis of L4 on L5. Review of the MIP images confirms the above findings. IMPRESSION: 1. No pulmonary embolus. 2. No acute intrathoracic abnormality. 3. Hepatomegaly and hepatic steatosis. 4. Nonspecific gallbladder wall thickening with no CT evidence of gallstones. Consider right upper quadrant ultrasound for further evaluation. 5. Aortic Atherosclerosis (ICD10-I70.0) including left anterior descending coronary calcification. Electronically Signed   By: Tish Frederickson M.D.   On: 07/30/2021 21:01   DG Chest 2 View  Result Date: 07/30/2021 CLINICAL DATA:  Chest pain EXAM: CHEST - 2 VIEW COMPARISON:  05/22/2021 FINDINGS: The heart size and mediastinal contours are within normal limits. Both lungs are clear. The visualized skeletal structures are unremarkable. IMPRESSION: No active cardiopulmonary disease. Electronically Signed   By: Judie Petit.  Shick M.D.   On: 07/30/2021 16:43    Procedures Procedures  {Document cardiac monitor, telemetry assessment procedure when appropriate:1}  Medications Ordered in ED Medications  famotidine (PEPCID) IVPB 20 mg premix (has no administration in time range)  LORazepam (ATIVAN) injection 2 mg (has no administration in time range)  sodium chloride 0.9 % bolus 1,000 mL (has no administration in time range)  iohexol (OMNIPAQUE) 350 MG/ML injection 100 mL (100 mLs Intravenous Contrast Given 07/30/21 2046)    ED Course/ Medical Decision Making/ A&P                           Medical Decision Making Risk Prescription drug management.  This patient complains of ***; this involves an extensive number of treatment Options and is a complaint that carries with it a high risk of complications and morbidity. The differential includes ***  I ordered, reviewed and interpreted labs, which included *** I ordered medication *** and reviewed PMP when indicated. I ordered imaging studies which included *** and I  independently    visualized and interpreted imaging which showed *** Additional history obtained from *** Previous records obtained and reviewed *** I consulted *** and discussed lab and imaging findings and discussed disposition.  Cardiac monitoring reviewed, *** Social determinants considered, *** Critical Interventions: ***  After the interventions stated above, I reevaluated the patient and found *** Admission and further testing considered, ***    {Document critical care time when appropriate:1} {Document review of labs and clinical decision tools ie heart score, Chads2Vasc2 etc:1}  {Document your independent review of radiology images, and any outside records:1} {Document your discussion with family members, caretakers, and with consultants:1} {Document social determinants of health affecting pt's care:1} {Document your decision making why or why not admission, treatments were needed:1} Final Clinical Impression(s) / ED Diagnoses Final diagnoses:  None    Rx / DC Orders ED Discharge Orders     None

## 2021-08-06 ENCOUNTER — Telehealth (INDEPENDENT_AMBULATORY_CARE_PROVIDER_SITE_OTHER): Payer: BC Managed Care – PPO | Admitting: Family Medicine

## 2021-08-06 ENCOUNTER — Encounter: Payer: Self-pay | Admitting: Family Medicine

## 2021-08-06 DIAGNOSIS — J0100 Acute maxillary sinusitis, unspecified: Secondary | ICD-10-CM | POA: Diagnosis not present

## 2021-08-06 DIAGNOSIS — F1010 Alcohol abuse, uncomplicated: Secondary | ICD-10-CM | POA: Diagnosis not present

## 2021-08-06 DIAGNOSIS — F10939 Alcohol use, unspecified with withdrawal, unspecified: Secondary | ICD-10-CM | POA: Diagnosis not present

## 2021-08-06 MED ORDER — CHLORDIAZEPOXIDE HCL 10 MG PO CAPS: 10.0000 mg | ORAL_CAPSULE | Freq: Two times a day (BID) | ORAL | 0 refills | Status: DC | PRN

## 2021-08-06 MED ORDER — AMOXICILLIN-POT CLAVULANATE 875-125 MG PO TABS: 1.0000 | ORAL_TABLET | Freq: Two times a day (BID) | ORAL | 0 refills | Status: DC

## 2021-08-06 NOTE — Assessment & Plan Note (Signed)
After 1 week of viral uri  Now purulent nasal mucous and facial pain   Px augmentin bid for 7d

## 2021-08-06 NOTE — Progress Notes (Signed)
Virtual Visit via Telephone Note  I connected with Nicole Hughes on 08/06/21 at 11:30 AM EDT by telephone and verified that I am speaking with the correct person using two identifiers.  Location: Patient: home Provider: office    I discussed the limitations, risks, security and privacy concerns of performing an evaluation and management service by telephone and the availability of in person appointments. I also discussed with the patient that there may be a patient responsible charge related to this service. The patient expressed understanding and agreed to proceed.  Parties involved in encounter  Patient: Nicole Hughes  Provider:  Roxy Manns MD    History of Present Illness: Pt presents for sinus symptoms   Thinks she has a sinus infection  Congestion  Irritated eyes / red also  Blood from nose  Green mucous Pain behind L eye  Over a week  Chills/ low grade temp on/off she thinks  Cough - little production     Covid test was neg at hospital    Was clean since hospitalization  Going through detox right now   Was given librium- helped craving and made her feel better  Last drink was Tuesday   Struggling Had a fall this am Equilibrium is off  Nausea - primarily lives on fruit (she chokes otherwise)   Is looking into rehab facilities Her daughter moved home as well  Declines social worker assisting   AA-tried  Counseling -getting this through the church   Yesterday was her deceased dad's birthday and it was hard for her   Has quit 4 times in the past   This am 147/70 Pulse 98  The alcohol gives her swallowing problems , this resolves when she stops drinking  Getting better  Her bm is more solid  Less nausea now /able to eat   Patient Active Problem List   Diagnosis Date Noted   Transaminitis 05/23/2021   High anion gap metabolic acidosis 12/14/2020   Fall at home, initial encounter 12/14/2020   Falls frequently 12/14/2020   Syncope 12/14/2020    Folic acid deficiency 12/14/2020   Acute diastolic CHF (congestive heart failure) (HCC) 12/14/2020   Alcohol withdrawal (HCC) 07/08/2020   Prolonged QT interval 05/19/2020   Hypomagnesemia 12/24/2019   Dysphagia 05/31/2019   Chronic anemia 03/31/2018   Appetite loss 03/08/2018   Myofascial pain 03/08/2018   IBS (irritable bowel syndrome) 03/08/2018   Alcoholic hepatitis without ascites 03/05/2018   Elevated LFTs 03/03/2018   Achilles tendonitis 01/28/2018   Abnormal cervical Papanicolaou smear 09/29/2017   Insomnia 09/29/2017   Chest wall pain 05/25/2017   Elevated lipase 05/25/2017   Depression with anxiety 02/23/2017   MRSA carrier 02/23/2017   Hypokalemia 09/24/2016   Chronic alcohol abuse 09/22/2016   H/O acute pancreatitis 09/22/2016   GERD (gastroesophageal reflux disease) 04/09/2015   Epigastric pain 01/17/2014   Acute sinusitis 01/17/2014   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/24/2012   Chest pain 08/20/2012   Stress reaction 07/23/2012   Essential hypertension 06/09/2012   History of pulmonary embolism 06/09/2012   Preop cardiovascular exam 06/08/2012   Dysmenorrhea    Dyspareunia    Pelvic pain    Past Medical History:  Diagnosis Date   Anxiety    Cardiomyopathy (HCC)    Chronic lower back pain    Complication of anesthesia    "hard to get under"   Cyst of ovary    Depression    hx   DVT complicating pregnancy 08/1998  RLE; RUE   Dysmenorrhea    Dyspareunia    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/24/2012   GERD (gastroesophageal reflux disease)    Hay fever    "fall and spring" (02/17/2017)   Headache    "2-3 times/.wik" (02/17/2017)   History of chicken pox    Hypertension    Migraine    "monthly" (02/17/2017)   Pelvic pain    Pulmonary embolism (HCC) 08/1998   S/P childbirth   Past Surgical History:  Procedure Laterality Date   CESAREAN SECTION  1994   ECTOPIC PREGNANCY SURGERY  2010   LAPAROSCOPY N/A 09/24/2012   Procedure: OPERATIVE  LAPROSCOPY WITH LYSIS OF ADHESIONS;  Surgeon: Sherron Monday, MD;  Location: [facility];  Service: Gynecology;  Laterality: N/A;   TONSILLECTOMY  1984   TUBAL LIGATION  2010   Social History   Tobacco Use   Smoking status: Never   Smokeless tobacco: Never  Vaping Use   Vaping Use: Never used  Substance Use Topics   Alcohol use: Not Currently    Comment: a 5th of liquor per day   Drug use: Yes    Frequency: 1.0 times per week    Types: Marijuana    Comment: Gives you chest pain - trying to quit   Family History  Problem Relation Age of Onset   Cancer - Lung Mother    Heart failure Father    Hypertension Father    No Known Allergies Current Outpatient Medications on File Prior to Visit  Medication Sig Dispense Refill   acetaminophen (TYLENOL) 500 MG tablet Take 1 tablet (500 mg total) by mouth every 8 (eight) hours as needed for moderate pain or mild pain. 20 tablet 0   amLODipine (NORVASC) 10 MG tablet Take 1 tablet (10 mg total) by mouth daily. 30 tablet 5   apixaban (ELIQUIS) 5 MG TABS tablet Take 1 tablet (5 mg total) by mouth 2 (two) times daily. 60 tablet 0   Carboxymethylcellul-Glycerin (CLEAR EYES FOR DRY EYES OP) Place 1 drop into both eyes daily as needed (dryness).     colchicine 0.6 MG tablet Take 1 tablet (0.6 mg total) by mouth daily for 5 days. 5 tablet 0   Magnesium Oxide 400 MG CAPS Take 2 capsules (800 mg total) by mouth 2 (two) times daily. 30 capsule 0   metoprolol succinate (TOPROL-XL) 100 MG 24 hr tablet TAKE 1 TABLET BY MOUTH ONCE DAILY WITH A MEAL OR IMMEDIATELY FOLLOWING A MEAL. APPOINTMENT REQUIRED FOR FUTURE REFILLS (Patient taking differently: Take 100 mg by mouth daily.) 30 tablet 3   pantoprazole (PROTONIX) 40 MG tablet Take 1 tablet (40 mg total) by mouth daily. 30 tablet 0   apixaban (ELIQUIS) 5 MG TABS tablet Take 2 tablets (10 mg total) by mouth 2 (two) times daily for 6 days. 24 tablet 0   lactulose, encephalopathy, (CHRONULAC) 10 GM/15ML SOLN Take 30  mLs (20 g total) by mouth 2 (two) times daily. (Patient not taking: Reported on 08/06/2021) 240 mL 0   mirtazapine (REMERON) 15 MG tablet Take 1 tablet (15 mg total) by mouth at bedtime. (Patient not taking: Reported on 08/06/2021) 15 tablet 0   traMADol (ULTRAM) 50 MG tablet Take 1 tablet (50 mg total) by mouth every 12 (twelve) hours as needed. (Patient not taking: Reported on 08/06/2021) 5 tablet 0   No current facility-administered medications on file prior to visit.   Review of Systems  Constitutional:  Positive for chills and malaise/fatigue.  HENT:  Positive for congestion and sinus pain. Negative for ear pain and sore throat.   Eyes:  Negative for blurred vision, discharge and redness.  Respiratory:  Positive for cough and sputum production. Negative for shortness of breath and stridor.   Cardiovascular:  Negative for chest pain, palpitations and leg swelling.  Gastrointestinal:  Positive for nausea. Negative for abdominal pain, diarrhea and vomiting.  Musculoskeletal:  Negative for myalgias.  Skin:  Negative for rash.  Neurological:  Positive for headaches. Negative for dizziness.      Observations/Objective: Pt sounds depressed and anxious but not in acute distress Voice is slightly hoarse  Sounds nasally congested  Occ clears throat but not coughing or sob  Cognition is nl/ good historian  Tearful affect at times  Candidly discusses symptoms and stressors     Assessment and Plan: Problem List Items Addressed This Visit       Respiratory   Acute sinusitis - Primary    After 1 week of viral uri  Now purulent nasal mucous and facial pain   Px augmentin bid for 7d       Relevant Medications   amoxicillin-clavulanate (AUGMENTIN) 875-125 MG tablet     Other   Alcohol withdrawal (HCC)    Pt is 7 d out from last drink  Used Librium from ER Reviewed hospital records, lab results and studies in detail    Px libruim 10 mg for the next wk bid prn  Pt plans to get into  rehab the first week in august (after grandchild starts school) She declines help from social work right now   Watching pulse and bp closely  Daughter lives with her and all etoh is out of the house inst to follow up here if not admitted to rehab in a week (will need liver tests)       Chronic alcohol abuse    Quit drinking a week ago  Librium helps withdrawal Stable vistals  She is planning rehab stay soon         Follow Up Instructions: Take augmentin for sinus infection  Drink fluids and rest  mucinex DM is good for cough and congestion  Nasal saline for congestion as needed  Tylenol for fever or pain or headache  Please alert [place] if symptoms worsen (if severe or short of breath please go to the ER)   Update if not starting to improve in a week or if worsening    Take libruim as needed  Get started with rehab program as planned (let us know if you need help from our social work staff)  Please stay hydrated !   I discussed the assessment and treatment plan with the patient. The patient was provided an opportunity to ask questions and all were answered. The patient agreed with the plan and demonstrated an understanding of the instructions.   The patient was advised to call back or seek an in-person evaluation if the symptoms worsen or if the condition fails to improve as anticipated.  I provided 19 minutes of non-face-to-face time during this encounter.   Loura Pardon, MD

## 2021-08-06 NOTE — Assessment & Plan Note (Signed)
Quit drinking a week ago  Librium helps withdrawal Stable vistals  She is planning rehab stay soon

## 2021-08-06 NOTE — Assessment & Plan Note (Signed)
Pt is 7 d out from last drink  Used Librium from ER Reviewed hospital records, lab results and studies in detail    Px libruim 10 mg for the next wk bid prn  Pt plans to get into rehab the first week in august (after grandchild starts school) She declines help from social work right now   Watching pulse and bp closely  Daughter lives with her and all etoh is out of the house inst to follow up here if not admitted to rehab in a week (will need liver tests)

## 2021-08-06 NOTE — Patient Instructions (Addendum)
Take augmentin for sinus infection  Drink fluids and rest  mucinex DM is good for cough and congestion  Nasal saline for congestion as needed  Tylenol for fever or pain or headache  Please alert [place] if symptoms worsen (if severe or short of breath please go to the ER)   Update if not starting to improve in a week or if worsening    Take libruim as needed  Get started with rehab program as planned (let us know if you need help from our social work staff)  Please stay hydrated ! Marland Kitchen

## 2021-08-21 ENCOUNTER — Ambulatory Visit (INDEPENDENT_AMBULATORY_CARE_PROVIDER_SITE_OTHER)
Admission: RE | Admit: 2021-08-21 | Discharge: 2021-08-21 | Disposition: A | Discharge status: Home / Self Care | Payer: BC Managed Care – PPO | Source: Ambulatory Visit | Attending: Family Medicine | Admitting: Family Medicine

## 2021-08-21 ENCOUNTER — Ambulatory Visit: Payer: BC Managed Care – PPO | Admitting: Family Medicine

## 2021-08-21 ENCOUNTER — Encounter: Payer: Self-pay | Admitting: Family Medicine

## 2021-08-21 VITALS — BP 120/68 | HR 107 | Ht 63.0 in | Wt 124.6 lb

## 2021-08-21 DIAGNOSIS — M7918 Myalgia, other site: Secondary | ICD-10-CM

## 2021-08-21 DIAGNOSIS — W19XXXA Unspecified fall, initial encounter: Secondary | ICD-10-CM

## 2021-08-21 DIAGNOSIS — Y92009 Unspecified place in unspecified non-institutional (private) residence as the place of occurrence of the external cause: Secondary | ICD-10-CM

## 2021-08-21 DIAGNOSIS — F418 Other specified anxiety disorders: Secondary | ICD-10-CM

## 2021-08-21 DIAGNOSIS — M25552 Pain in left hip: Secondary | ICD-10-CM | POA: Diagnosis not present

## 2021-08-21 DIAGNOSIS — M25562 Pain in left knee: Secondary | ICD-10-CM

## 2021-08-21 DIAGNOSIS — M25462 Effusion, left knee: Secondary | ICD-10-CM | POA: Diagnosis not present

## 2021-08-21 DIAGNOSIS — F1010 Alcohol abuse, uncomplicated: Secondary | ICD-10-CM | POA: Diagnosis not present

## 2021-08-21 MED ORDER — METOPROLOL SUCCINATE ER 100 MG PO TB24: 100.0000 mg | ORAL_TABLET | Freq: Every day | ORAL | 1 refills | Status: AC

## 2021-08-21 MED ORDER — METHOCARBAMOL 500 MG PO TABS: 500.0000 mg | ORAL_TABLET | Freq: Three times a day (TID) | ORAL | 0 refills | Status: DC | PRN

## 2021-08-21 NOTE — Assessment & Plan Note (Signed)
Pt has returned to drinking  Is interested in recovery and rehab as well as detox Will ref to social work to see if they can offer help looking for rehab facility

## 2021-08-21 NOTE — Assessment & Plan Note (Signed)
Worse lately Back to drinking  Is interested in treatment (comm care ref placed) Reviewed stressors/ coping techniques/symptoms/ support sources/ tx options and side effects in detail today  Mirtazapine 15 mg daily for depression and sleep

## 2021-08-21 NOTE — Assessment & Plan Note (Signed)
Hematoma noted Appears to be resolving from fall on 8/10  Adv use of ice  Methocarbamol for spasm/muscle tightness pain   Pelvis xr ordered

## 2021-08-21 NOTE — Progress Notes (Signed)
Subjective:    Patient ID: Nicole Hughes, female    DOB: Nov 05, 1970, 51 y.o.   MRN: [id number]  HPI Pt presents for R knee and hip pain after a fall   Wt Readings from Last 3 Encounters:  08/21/21 124 lb 9.6 oz (56.5 kg)  07/30/21 133 lb (60.3 kg)  05/31/21 134 lb 11.2 oz (61.1 kg)   22.07 kg/m  Took librium on 10th  Got up to use the bathroom and fell /lost balance  Broke a table  Sideways onto L leg  Has a knot on tail area  Knee hurts all the time Worse to straighten    Initially very swollen L knee  Made her foot swell also   Has never had knee injury before     Unable to sleep due to aching pain     Otc:  Tylenol and ibuprofen  Using ice and heat and elevation   BP Readings from Last 3 Encounters:  08/21/21 120/68  07/30/21 (!) 148/82  06/02/21 (!) 113/54   Pulse Readings from Last 3 Encounters:  08/21/21 (!) 107  07/30/21 83  06/02/21 79   She has called a rehab program and there was no availability (left the list at home= one in [place] and one in [place])   Last drink was yesterday   Interested in rehab   A lot of menopause symptoms   Stress Imminent divorce  Loss of dad   Not currently taking care of kids -grandson started schoool   Patient Active Problem List   Diagnosis Date Noted   Pain and swelling of left knee 08/21/2021   Left buttock pain 08/21/2021   Transaminitis 05/23/2021   High anion gap metabolic acidosis 12/14/2020   Fall at home, initial encounter 12/14/2020   Falls frequently 12/14/2020   Syncope 12/14/2020   Folic acid deficiency 12/14/2020   Acute diastolic CHF (congestive heart failure) (HCC) 12/14/2020   Alcohol withdrawal (HCC) 07/08/2020   Prolonged QT interval 05/19/2020   Hypomagnesemia 12/24/2019   Dysphagia 05/31/2019   Chronic anemia 03/31/2018   Appetite loss 03/08/2018   Myofascial pain 03/08/2018   IBS (irritable bowel syndrome) 03/08/2018   Alcoholic hepatitis without ascites 03/05/2018    Elevated LFTs 03/03/2018   Achilles tendonitis 01/28/2018   Abnormal cervical Papanicolaou smear 09/29/2017   Insomnia 09/29/2017   Chest wall pain 05/25/2017   Elevated lipase 05/25/2017   Depression with anxiety 02/23/2017   MRSA carrier 02/23/2017   Hypokalemia 09/24/2016   Chronic alcohol abuse 09/22/2016   H/O acute pancreatitis 09/22/2016   GERD (gastroesophageal reflux disease) 04/09/2015   Epigastric pain 01/17/2014   Acute sinusitis 01/17/2014   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/24/2012   Chest pain 08/20/2012   Stress reaction 07/23/2012   Essential hypertension 06/09/2012   History of pulmonary embolism 06/09/2012   Preop cardiovascular exam 06/08/2012   Dysmenorrhea    Dyspareunia    Pelvic pain    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   GERD (gastroesophageal reflux disease)    Hay fever    "fall and spring" (02/17/2017)   Headache    "  2-3 times/.wik" (02/17/2017)   History of chicken pox    Hypertension    Migraine    "monthly" (02/17/2017)   Pelvic pain    Pulmonary embolism (HCC) 08/1998   S/P childbirth   Past Surgical History:  Procedure Laterality Date   CESAREAN SECTION  1994   ECTOPIC PREGNANCY SURGERY  2010   LAPAROSCOPY N/A 09/24/2012   Procedure: OPERATIVE LAPROSCOPY WITH LYSIS OF ADHESIONS;  Surgeon: Sherron Monday, MD;  Location: [facility];  Service: Gynecology;  Laterality: N/A;   TONSILLECTOMY  1984   TUBAL LIGATION  2010   Social History   Tobacco Use   Smoking status: Never   Smokeless tobacco: Never  Vaping Use   Vaping Use: Never used  Substance Use Topics   Alcohol use: Not Currently    Comment: a 5th of liquor per day   Drug use: Yes    Frequency:  1.0 times per week    Types: Marijuana    Comment: Gives you chest pain - trying to quit   Family History  Problem Relation Age of Onset   Cancer - Lung Mother    Heart failure Father    Hypertension Father    No Known Allergies Current Outpatient Medications on File Prior to Visit  Medication Sig Dispense Refill   acetaminophen (TYLENOL) 500 MG tablet Take 1 tablet (500 mg total) by mouth every 8 (eight) hours as needed for moderate pain or mild pain. 20 tablet 0   amLODipine (NORVASC) 10 MG tablet Take 1 tablet (10 mg total) by mouth daily. 30 tablet 5   apixaban (ELIQUIS) 5 MG TABS tablet Take 1 tablet (5 mg total) by mouth 2 (two) times daily. 60 tablet 0   Carboxymethylcellul-Glycerin (CLEAR EYES FOR DRY EYES OP) Place 1 drop into both eyes daily as needed (dryness).     lactulose, encephalopathy, (CHRONULAC) 10 GM/15ML SOLN Take 30 mLs (20 g total) by mouth 2 (two) times daily. 240 mL 0   Magnesium Oxide 400 MG CAPS Take 2 capsules (800 mg total) by mouth 2 (two) times daily. 30 capsule 0   metoprolol succinate (TOPROL-XL) 100 MG 24 hr tablet TAKE 1 TABLET BY MOUTH ONCE DAILY WITH A MEAL OR IMMEDIATELY FOLLOWING A MEAL. APPOINTMENT REQUIRED FOR FUTURE REFILLS (Patient taking differently: Take 100 mg by mouth daily.) 30 tablet 3   mirtazapine (REMERON) 15 MG tablet Take 1 tablet (15 mg total) by mouth at bedtime. 15 tablet 0   pantoprazole (PROTONIX) 40 MG tablet Take 1 tablet (40 mg total) by mouth daily. 30 tablet 0   apixaban (ELIQUIS) 5 MG TABS tablet Take 2 tablets (10 mg total) by mouth 2 (two) times daily for 6 days. 24 tablet 0   colchicine 0.6 MG tablet Take 1 tablet (0.6 mg total) by mouth daily for 5 days. 5 tablet 0   traMADol (ULTRAM) 50 MG tablet Take 1 tablet (50 mg total) by mouth every 12 (twelve) hours as needed. (Patient not taking: Reported on 08/21/2021) 5 tablet 0   No current facility-administered medications on file prior to visit.     Review of Systems   Constitutional:  Positive for fatigue. Negative for activity change, appetite change, fever and unexpected weight change.  HENT:  Negative for congestion, ear pain, rhinorrhea, sinus pressure and sore throat.   Eyes:  Negative for pain, redness and visual disturbance.  Respiratory:  Negative for cough, shortness of breath and wheezing.   Cardiovascular:  Negative for chest pain and  palpitations.  Gastrointestinal:  Negative for abdominal pain, blood in stool, constipation and diarrhea.  Endocrine: Negative for polydipsia and polyuria.  Genitourinary:  Negative for dysuria, frequency and urgency.  Musculoskeletal:  Positive for arthralgias, back pain and gait problem. Negative for myalgias.  Skin:  Negative for pallor and rash.  Allergic/Immunologic: Negative for environmental allergies.  Neurological:  Negative for dizziness, syncope and headaches.  Hematological:  Negative for adenopathy. Does not bruise/bleed easily.  Psychiatric/Behavioral:  Positive for dysphoric mood. Negative for decreased concentration. The patient is nervous/anxious.        Objective:   Physical Exam Constitutional:      General: She is not in acute distress.    Appearance: Normal appearance. She is normal weight. She is not ill-appearing or diaphoretic.  Eyes:     General: No scleral icterus.    Conjunctiva/sclera: Conjunctivae normal.     Pupils: Pupils are equal, round, and reactive to light.  Neck:     Vascular: No carotid bruit.  Cardiovascular:     Rate and Rhythm: Regular rhythm. Tachycardia present.     Heart sounds: Normal heart sounds.  Pulmonary:     Effort: Pulmonary effort is normal. No respiratory distress.     Breath sounds: Normal breath sounds. No stridor. No wheezing, rhonchi or rales.  Abdominal:     General: Abdomen is flat. Bowel sounds are normal. There is no distension.     Tenderness: There is no abdominal tenderness. There is no guarding.  Musculoskeletal:     Cervical back:  Neck supple. No rigidity.     Right lower leg: No edema.     Left lower leg: No edema.     Comments: Knee :L  Mild superficial swelling, no obvious effusion No warmth to the touch  No crepitus  ROM: limited due to pain  Flex 5-10 deg Ext 10-20 deg Mcmurray: pain  Bounce test : pain   Stability: Anterior drawer-nl Lachman exam -nl  Tenderness bilateral joint line  No patellar tenderness  Gait : pain seems out of proportion to exam  Ecchymosis /hematoma on L gluteal cheek  Color indicates resolving  Some tenderness  Pain with internal hip rotation (buttock not pelvis or groin)     Lymphadenopathy:     Cervical: No cervical adenopathy.  Skin:    Coloration: Skin is not jaundiced or pale.     Findings: No erythema or rash.  Neurological:     Mental Status: She is alert.     Cranial Nerves: No cranial nerve deficit.     Motor: Tremor present.           Assessment & Plan:   Problem List Items Addressed This Visit       Other   Chronic alcohol abuse    Pt has returned to drinking  Is interested in recovery and rehab as well as detox Will ref to social work to see if they can offer help looking for rehab facility        Depression with anxiety    Worse lately Back to drinking  Is interested in treatment (comm care ref placed) Reviewed stressors/ coping techniques/symptoms/ support sources/ tx options and side effects in detail today  Mirtazapine 15 mg daily for depression and sleep      Fall at home, initial encounter - Primary    Fall at night onto L side on 8/10  Injury to L buttock (hematoma) and knee   Disc need for alcohol cessation  Left buttock pain    Hematoma noted Appears to be resolving from fall on 8/10  Adv use of ice  Methocarbamol for spasm/muscle tightness pain   Pelvis xr ordered       Relevant Orders   DG Hip Unilat W OR W/O Pelvis 2-3 Views Left   Pain and swelling of left knee    S.p fall on 8/10 onto L side   Swelling is improved Lateral tenderness and limited rom due to pain  Cannot have oral nsaid to to eliquis  Adv trial of voltaren gel (has at home)  Disc tramadol- she has h/o prolonged qt however  She is currently trying to quit drinking Disc fall prev in detail   Xray of knee (and hip today) Per pt the knee brace makes it worse instead of better so inst to not wear  Will continue elevation and ice  Poss int injury-may need sport med input        Relevant Orders   DG Knee 4 Views W/Patella Left

## 2021-08-21 NOTE — Assessment & Plan Note (Signed)
Fall at night onto L side on 8/10  Injury to L buttock (hematoma) and knee   Disc need for alcohol cessation

## 2021-08-21 NOTE — Patient Instructions (Addendum)
Use ice on knee and buttock   Methocarbamol as needed Caution of sedation  Also voltaren gel for knee and buttock  Do not mix with alcohol   Xray now   I will put a social work referral in to discuss alcohol treatment  If you don't get a call in a week please let me know

## 2021-08-21 NOTE — Assessment & Plan Note (Signed)
S.p fall on 8/10 onto L side  Swelling is improved Lateral tenderness and limited rom due to pain  Cannot have oral nsaid to to eliquis  Adv trial of voltaren gel (has at home)  Disc tramadol- she has h/o prolonged qt however  She is currently trying to quit drinking Disc fall prev in detail   Xray of knee (and hip today) Per pt the knee brace makes it worse instead of better so inst to not wear  Will continue elevation and ice  Poss int injury-may need sport med input

## 2021-08-22 ENCOUNTER — Telehealth: Payer: Self-pay | Admitting: *Deleted

## 2021-08-22 NOTE — Chronic Care Management (AMB) (Signed)
  Care Coordination  Outreach Note  08/22/2021 Name: Nicole Hughes MRN: [id number] DOB: [birth year]   Care Coordination Outreach Attempts  An unsuccessful telephone outreach was attempted today to offer the patient information about available care coordination services as a benefit of their health plan.   Received referral   Follow Up Plan:  Additional outreach attempts will be made to offer the patient care coordination information and services.   Encounter Outcome:  No Answer  Burman Nieves, CCMA Care Coordination Care Guide Direct Dial: [phone]

## 2021-08-22 NOTE — Chronic Care Management (AMB) (Signed)
  Care Coordination   Note   08/22/2021 Name: Nicole Hughes MRN: [id number] DOB: December 19, 1970  Nicole Hughes is a 51 y.o. year old female who sees Tower, Audrie Gallus, MD for primary care. I reached out to [name] by phone today to offer care coordination services.  [name] was given information about Care Coordination services today including:   The Care Coordination services include support from the care team which includes your Nurse Coordinator, Clinical Social Worker, or Pharmacist.  The Care Coordination team is here to help remove barriers to the health concerns and goals most important to you. Care Coordination services are voluntary, and the patient may decline or stop services at any time by request to their care team member.   Care Coordination Consent Status: Patient agreed to services and verbal consent obtained.   Follow up plan:  Telephone appointment with care coordination team member scheduled for:  08/29/2021  Encounter Outcome:  Pt. Scheduled from referral   Burman Nieves, [facility] Care Coordination Care Guide Direct Dial: [phone]

## 2021-08-23 ENCOUNTER — Telehealth: Payer: Self-pay | Admitting: Family Medicine

## 2021-08-23 DIAGNOSIS — M25562 Pain in left knee: Secondary | ICD-10-CM

## 2021-08-23 DIAGNOSIS — M7918 Myalgia, other site: Secondary | ICD-10-CM

## 2021-08-23 NOTE — Telephone Encounter (Signed)
Patient called in asking about xray results from xrays done on 8/16. She also wanted Dr Milinda Antis to know that the medication methocarbamol (ROBAXIN) 500 MG tablet that was prescribed to her isn't helping her at all. She is still in a lot of pain.

## 2021-08-23 NOTE — Telephone Encounter (Signed)
Patient called in stating the medication methocarbamol (ROBAXIN) 500 MG tablet she received for her fall isn't working. She was wondering what else she can do and if her results from x-ray are back. Please advise. Thank you!

## 2021-08-26 MED ORDER — CYCLOBENZAPRINE HCL 10 MG PO TABS: 5.0000 mg | ORAL_TABLET | Freq: Three times a day (TID) | ORAL | 0 refills | Status: AC | PRN

## 2021-08-26 NOTE — Telephone Encounter (Signed)
Called and informed pt of this . 

## 2021-08-26 NOTE — Telephone Encounter (Signed)
Is this okay to fill? 

## 2021-08-26 NOTE — Telephone Encounter (Signed)
Stop the methocarbamol I sent flexeril to try -it may help more   More classic pain meds are not an option for her due to chronic issues

## 2021-08-26 NOTE — Telephone Encounter (Signed)
Called and notified pt of this  

## 2021-08-26 NOTE — Telephone Encounter (Signed)
I sent flexeril Also sent ortho referral

## 2021-08-26 NOTE — Telephone Encounter (Signed)
I spoke with pt and xray report given as instructed and pt voiced understanding;pt said that she would like ortho referral to [facility]; pt said any position pain level is 15 today. Pt has been alternating ice and heat and muscle relaxant and tylenol is not helping pain per pt. Pt request pain med to [facility] and pt request cb after med sent to pharmacy. UC & ED precautions given and pt voiced understanding. Sending note to Dr Milinda Antis and [place] CMA.

## 2021-08-26 NOTE — Telephone Encounter (Signed)
[facility] Night - Client TELEPHONE ADVICE RECORD AccessNurse Patient Name: Nicole Hughes Gender: Female DOB: [birth year] Age: 51 Y 4 M 23 D Return Phone Number: [phone] (Primary) Address: City/ State/ Zip: [place]  [address] Client [facility] Night - Client Client Site [facility] - Night Provider Milinda Antis, Idamae Schuller - MD Contact Type Call Who Is Calling Patient / Member / Family / Caregiver Call Type Triage / Clinical Caller Name Lorissa Kishbaugh Relationship To Patient Self Return Phone Number [phone] (Primary) Chief Complaint Leg Pain Reason for Call Symptomatic / Request for Health Information Initial Comment Caller states she is having left leg pain after falling. States the medication she is on is not helping. Wants to know what they should be doing. Translation No Nurse Assessment Nurse: Jean Rosenthal, RN, Dorathy Daft Date/Time (Eastern Time): 08/23/2021 5:44:09 PM Confirm and document reason for call. If symptomatic, describe symptoms. ---Caller c/o left leg pain post-fall. States the medication she is taking is not helping. States she fell on 08/15/21, saw Dr. Milinda Antis on Wednesday. Was prescribed a muscle relaxer during this visit, but states this is not helping at all. States she has severe pain in her left leg starting at her hip and going all the way down to her foot. Pain is severe, causing issues with walking and unable to bear much weight on the LLE.Marland Kitchen An x-ray was taken but no results back. Unable to sleep, c/o nausea, and reports large bruising on the left leg, states this is much larger than when she saw her PCP on Wed. Pt on eliquis. Does the patient have any new or worsening symptoms? ---Yes Will a triage be completed? ---Yes Related visit to physician within the last 2 weeks? ---Yes Does the PT have any chronic conditions? (i.e. diabetes, asthma, this includes High risk factors  for pregnancy, etc.) ---Yes List chronic conditions. ---HTN, eliquis for blood clots in her arms 4 months ago Is the patient pregnant or possibly pregnant? (Ask all females between the ages of 63-55) ---No Is this a behavioral health or substance abuse call? ---No PLEASE NOTE: All timestamps contained within this report are represented as [place] Standard Time. CONFIDENTIALTY NOTICE: This fax transmission is intended only for the addressee. It contains information that is legally privileged, confidential or otherwise protected from use or disclosure. If you are not the intended recipient, you are strictly prohibited from reviewing, disclosing, copying using or disseminating any of this information or taking any action in reliance on or regarding this information. If you have received this fax in error, please notify us immediately by telephone so that we can arrange for its return to [place]. Phone: [phone], Toll-Free: [phone], Fax: [phone] Page: 2 of 3 Call Id: [id number] Guidelines Guideline Title Affirmed Question Affirmed Notes Nurse Date/Time Lamount Cohen Time) Leg Injury [1] SEVERE pain (e.g., excruciating pain, unable to do any normal activities) AND [2] not improved 2 hours after pain medicine/ ice packs Jean Rosenthal RN, Dorathy Daft 08/23/2021 5:52:28 PM Disp. Time Lamount Cohen Time) Disposition Final User 08/23/2021 5:58:42 PM See HCP within 4 Hours (or PCP triage) Yes Jean Rosenthal, RN, Dorathy Daft Final Disposition 08/23/2021 5:58:42 PM See HCP within 4 Hours (or PCP triage) Yes Jean Rosenthal, RN, Dorathy Daft Caller Disagree/Comply Disagree Caller Understands Yes PreDisposition Call Doctor Care Advice Given Per Guideline SEE HCP (OR PCP TRIAGE) WITHIN 4 HOURS: * IF OFFICE WILL BE CLOSED AND NO PCP (PRIMARY CARE PROVIDER) SECOND-LEVEL TRIAGE: You need to be seen within the next 3  or 4 hours. A nearby Urgent Care Center [facility]) is often a good source of care. Another choice is to go to the ED. Go sooner  if you become worse. * IF OFFICE WILL BE CLOSED AND PCP SECOND-LEVEL TRIAGE REQUIRED: You may need to be seen. Your doctor (or NP/PA) will want to talk with you to decide what's best. I'll page the on-call provider now. If you haven't heard from the provider (or me) within 30 minutes, call again. NOTE: If on-call provider can't be reached, send to [facility] or ED. NO STANDING: * Try not to put any weight on the injured leg. CALL BACK IF: * You become worse CARE ADVICE given per Leg Injury (Adult) guideline. * UCC: Some UCCs can manage patients who are stable and have less serious symptoms (e.g., minor illnesses and injuries). The triager must know the [facility] capabilities before sending a patient there. If unsure, call ahead. * ED: Patients who may need surgery or hospital admission need to be sent to an ED. So do most patients with serious symptoms or complex medical problems. ANOTHER ADULT SHOULD DRIVE: * It is better and safer if another adult drives instead of you. Comments User: Everlene Other, RN Date/Time (Eastern Time): 08/23/2021 6:04:12 PM Pt states she does not have a car or money for transportation. Asked RN if i could contact on-call provider for additional pain medication rx. Advised pt that I am unable to assist w/ medication rx or controlled substance requests after hours. Advised will send message to office to notify of request; however, due to the current level of pain suggested to go to UC or ED within 3-4 hours for evaluation. Advised pt to attempt to find assistance w/ transportation to be evaluated. Verbalizes understanding. PLEASE NOTE: All timestamps contained within this report are represented as [place] Standard Time. CONFIDENTIALTY NOTICE: This fax transmission is intended only for the addressee. It contains information that is legally privileged, confidential or otherwise protected from use or disclosure. If you are not the intended recipient, you are strictly prohibited from  reviewing, disclosing, copying using or disseminating any of this information or taking any action in reliance on or regarding this information. If you have received this fax in error, please notify us immediately by telephone so that we can arrange for its return to [place]. Phone: [phone], Toll-Free: [phone], Fax: [phone] Page: 3 of 3 Call Id: [id number] Referrals GO TO FACILITY REFUSE

## 2021-08-29 ENCOUNTER — Ambulatory Visit: Payer: Self-pay | Admitting: *Deleted

## 2021-08-29 NOTE — Patient Outreach (Signed)
  Care Coordination   Initial Visit Note   08/29/2021 Name: Nicole Hughes MRN: [id number] DOB: [birth year]  Nicole Hughes is a 51 y.o. year old female who sees Tower, Audrie Gallus, MD for primary care. I spoke with  [name] by phone today  What matters to the patients health and wellness today?  Inpatient detox and rehab    Goals Addressed             This Visit's Progress    In patient substance abuse treatment       Care Coordination Interventions:  Patient requesting assistance with low cost inpatient detox and alcohol treatment Patient discussed losing her father recently and having conflcits in her marriage that has exacerbated her alchohol use [facility] [phone] contacted-not taking patients at this time [facility] Contacted-patient will have to call and do a screening before acceptance [phone] It was discussed that patient should contact her insurance company  [phone] option 2 to obtain a list of in network substance abuse programs-patient agreeable to call today Motivational Interviewing employed-patient encouraged to contact her insurance company for list of in-network providers PHQ2/PHQ9 completed Solution-Focused Strategies employed:  Active listening / Reflection utilized  Emotional Support Provided Participation in counseling encouraged          SDOH assessments and interventions completed:  Yes  SDOH Interventions Today    Flowsheet Row Most Recent Value  SDOH Interventions   Food Insecurity Interventions Intervention Not Indicated  Housing Interventions Intervention Not Indicated  Stress Interventions Other (Comment)  [inpatient treatment]        Care Coordination Interventions Activated:  Yes  Care Coordination Interventions:  Yes, provided   Follow up plan: Follow up call scheduled for 09/02/21    Encounter Outcome:  Pt. Visit Completed

## 2021-08-29 NOTE — Patient Instructions (Signed)
Visit Information  Thank you for taking time to visit with me today. Please don't hesitate to contact me if I can be of assistance to you.   Following are the goals we discussed today:   Goals Addressed             This Visit's Progress    In patient substance abuse treatment       Care Coordination Interventions:  Patient requesting assistance with low cost inpatient detox and alcohol treatment Patient discussed losing her father recently and having conflcits in her marriage that has exacerbated her alchohol use [facility] [phone] contacted-not taking patients at this time [facility] Contacted-patient will have to call and do a screening before acceptance [phone] It was discussed that patient should contact her insurance company  [phone] option 2 to obtain a list of in network substance abuse programs-patient agreeable to call today Motivational Interviewing employed-patient encouraged to contact her insurance company for list of in-network providers PHQ2/PHQ9 completed Solution-Focused Strategies employed:  Active listening / Reflection utilized  Emotional Support Provided Participation in counseling encouraged          Our next appointment is by telephone on 09/02/21 at 2:30pm  Please call the care guide team at [phone] if you need to cancel or reschedule your appointment.   If you are experiencing a Mental Health or Behavioral Health Crisis or need someone to talk to, please call the Suicide and Crisis Lifeline: 988   Patient verbalizes understanding of instructions and care plan provided today and agrees to view in MyChart. Active MyChart status and patient understanding of how to access instructions and care plan via MyChart confirmed with patient.     Telephone follow up appointment with care management team member scheduled for: 09/02/21  Verna Czech, LCSW Clinical Social Worker [facility] Care Management [phone]

## 2021-09-02 ENCOUNTER — Telehealth: Payer: Self-pay | Admitting: Family Medicine

## 2021-09-02 ENCOUNTER — Ambulatory Visit: Payer: Self-pay | Admitting: *Deleted

## 2021-09-02 NOTE — Telephone Encounter (Signed)
Patient called in and stated she called the pharmacy regarding metoprolol succinate (TOPROL-XL) 100 MG 24 hr tablet. They stated her prescription wouldn't be ready until Sept 4th and informed her to contact Dr. Milinda Antis. Patient is completely out of her medication. Please advise. Thank you!

## 2021-09-02 NOTE — Patient Outreach (Signed)
  Care Coordination   Follow Up Visit Note   09/02/2021 Name: Nicole Hughes MRN: [id number] DOB: November 21, 1970  Nicole Hughes is a 51 y.o. year old female who sees Tower, Audrie Gallus, MD for primary care. I spoke with  [name] by phone today.  What matters to the patients health and wellness today?  Substance misuse treatment    Goals Addressed             This Visit's Progress    In patient substance abuse treatment       Care Coordination Interventions:  Patient requesting assistance with low cost inpatient detox and alcohol treatment Patient previously discussed losing her father recently and having conflcits in her marriage that has exacerbated her alchohol use Patient confirmed that she did contact her insurance company  [phone] option 2 to obtain a list of in network substance abuse programs-per patient list was sent in the mail This social worker agreed to review list with her once received Motivational Interviewing employed-patient encouraged to contact her insurance company for list of in-network providers as soon as received Active listening / Reflection utilized  Industrial/product designer Provided Participation in counseling encouraged          SDOH assessments and interventions completed:  Yes     Care Coordination Interventions Activated:  No  Care Coordination Interventions:  Yes, provided   Follow up plan: Follow up call scheduled for 09/10/21  2pm  Encounter Outcome:  Pt. Visit Completed

## 2021-09-02 NOTE — Telephone Encounter (Signed)
Called pharmacy and she wasn't sure why it wasn't going to be ready until the 4th so she is getting it ready now. Called pt and advise her also

## 2021-09-02 NOTE — Patient Instructions (Signed)
Visit Information  Thank you for taking time to visit with me today. Please don't hesitate to contact me if I can be of assistance to you.   Following are the goals we discussed today:   Goals Addressed             This Visit's Progress    In patient substance abuse treatment       Care Coordination Interventions:  Patient requesting assistance with low cost inpatient detox and alcohol treatment Patient previously discussed losing her father recently and having conflcits in her marriage that has exacerbated her alchohol use Patient confirmed that she did contact her insurance company  [phone] option 2 to obtain a list of in network substance abuse programs-per patient list was sent in the mail This social worker agreed to review list with her once received Motivational Interviewing employed-patient encouraged to contact her insurance company for list of in-network providers as soon as received Active listening / Reflection utilized  Emotional Support Provided Participation in counseling encouraged          Our next appointment is by telephone on 09/10/21 at 2pm  Please call the care guide team at [phone] if you need to cancel or reschedule your appointment.   If you are experiencing a Mental Health or Behavioral Health Crisis or need someone to talk to, please call the Suicide and Crisis Lifeline: 988   Patient verbalizes understanding of instructions and care plan provided today and agrees to view in MyChart. Active MyChart status and patient understanding of how to access instructions and care plan via MyChart confirmed with patient.     Telephone follow up appointment with care management team member scheduled for: 09/10/21  Verna Czech, LCSW Clinical Social Worker  [facility] Care Management [phone]

## 2021-09-03 ENCOUNTER — Ambulatory Visit: Payer: BC Managed Care – PPO | Admitting: Sports Medicine

## 2021-09-06 ENCOUNTER — Ambulatory Visit: Payer: BC Managed Care – PPO | Admitting: Sports Medicine

## 2021-09-10 ENCOUNTER — Encounter: Payer: Self-pay | Admitting: *Deleted

## 2021-09-10 ENCOUNTER — Ambulatory Visit: Payer: BC Managed Care – PPO | Admitting: Sports Medicine

## 2021-09-10 ENCOUNTER — Telehealth: Payer: Self-pay | Admitting: *Deleted

## 2021-09-10 NOTE — Patient Outreach (Signed)
  Care Management   Outreach Note  09/10/2021 Name: Nicole Hughes MRN: [id number] DOB: [birth year]  Referred by: Tower, Audrie Gallus, MD Reason for referral : Care Coordination    Unsuccessful telephone outreach attempt to Nicole Hughes today.   Follow Up Plan: The CM team will reach out to Nicole Hughes again over the next 7 days.   Verna Czech, LCSW Clinical Social Worker  [facility] Care Management [phone]

## 2021-09-12 ENCOUNTER — Ambulatory Visit: Payer: BC Managed Care – PPO | Admitting: Sports Medicine

## 2021-09-12 ENCOUNTER — Encounter: Payer: Self-pay | Admitting: Sports Medicine

## 2021-09-12 ENCOUNTER — Ambulatory Visit (INDEPENDENT_AMBULATORY_CARE_PROVIDER_SITE_OTHER): Payer: BC Managed Care – PPO

## 2021-09-12 VITALS — BP 120/68 | Ht 63.0 in | Wt 124.0 lb

## 2021-09-12 DIAGNOSIS — S8012XA Contusion of left lower leg, initial encounter: Secondary | ICD-10-CM

## 2021-09-12 DIAGNOSIS — M5442 Lumbago with sciatica, left side: Secondary | ICD-10-CM

## 2021-09-12 DIAGNOSIS — S8002XA Contusion of left knee, initial encounter: Secondary | ICD-10-CM | POA: Diagnosis not present

## 2021-09-12 DIAGNOSIS — G8929 Other chronic pain: Secondary | ICD-10-CM

## 2021-09-12 DIAGNOSIS — R296 Repeated falls: Secondary | ICD-10-CM

## 2021-09-12 DIAGNOSIS — M6281 Muscle weakness (generalized): Secondary | ICD-10-CM | POA: Diagnosis not present

## 2021-09-12 DIAGNOSIS — R634 Abnormal weight loss: Secondary | ICD-10-CM

## 2021-09-12 MED ORDER — GABAPENTIN 100 MG PO CAPS: 100.0000 mg | ORAL_CAPSULE | Freq: Two times a day (BID) | ORAL | 0 refills | Status: AC

## 2021-09-12 NOTE — Progress Notes (Signed)
Change in medications; has fallen multiple times over the last month. Patient states the pain is constant and cannot get any relief. Has tried multiple medications like Tylenol and Muscle Relaxers. Has x-rays of the left hip.

## 2021-09-12 NOTE — Patient Instructions (Addendum)
Nieve, it was great to see you today, thank you for letting me participate in your care.  Today, we discussed your back and leg pain.  You do have some soft tissue injury and bruising, although I am concerned you possibly have a pinched nerve in your low back that is shooting pain down your leg. Part of your pain is due to muscle weakness--we need to help build your strength back up. This includes home exercises and weights in a safe, balanced position and physical therapy.  I am putting in an order for physical therapy, they will call you to get set up.  Someone should be reaching out to you by early next week as well to schedule your MRI of the lumbar spine/low back.   I would also recommend the following supplements:  - Vitamin D (800IU) daily - Calcium 1200mg  daily  - For muscle health and strength, ingest between 20-25g (1 serving) of whey protein powder once a day. You can mix it in water or milk or put it into a smoothie.  If you do not want to use the whey protein powder, you may start drinking 2 ensures once daily.  - We will try a very low dose of Gabapentin 100mg  to help with your nerve pain.  Sometimes this can make you tired or dizzy, so if it does you can discontinue it. *Begin gabapentin 100 mg (1 tablet) at nighttime only for one week.  *If you are tolerating this well without side effects, you will increase to gabapentin 100 mg (1 tablet) twice a day.   You will follow-up with me after you obtain an MRI, call to schedule an appointment a few days later once your MRI is completed.  If you have any further questions, please give the clinic a call [phone].  , DO Primary Care Sports Medicine Physician  [facility] [place] - Orthopedics

## 2021-09-12 NOTE — Progress Notes (Signed)
Nicole Hughes - 51 y.o. female MRN [id number]  Date of birth: 09-11-1970  Office Visit Note: Visit Date: 09/12/2021 PCP: Judy Pimple, MD Referred by: Tower, Audrie Gallus, MD  Subjective: Chief Complaint  Patient presents with   Left Hip - Pain   Lower Back - Pain   HPI: Nicole Hughes is a pleasant 51 y.o. female who presents today for left-sided low back and posterior hip pain as well as left-sided knee pain.  She unfortunately has had several falls over the last month.  She has been working with her PCP and they have been changing her medications.  Some of her falls are from dizziness.  She had x-rays done by her PCP of the left hip and left knee without any evidence of fracture.  She is currently taking Tylenol and muscle relaxers.  Was on a Librium taper, but discontinue this secondary to side effects.  On some of her recent fall she did fall onto the left side and this aggravated the hip and knee.  She does report bruising of the posterior lateral hip and buttock area as well as her left deltoid muscle.  Per the patient and her daughter who is present at her visit today, she has lost about 40+ pounds over the last 6 months as she is currently working on coming clean from her alcohol use disorder.   The worst pain is a sharp shooting pain, as well as reporting numbness and tingling that extends from the left low back and goes down the lateral thigh crossing over the anterior aspect of the knee and anterior shin.  She does state that the left leg will give out on her at times as well.  Pertinent ROS were reviewed with the patient and found to be negative unless otherwise specified above in HPI.   Assessment & Plan: Visit Diagnoses:  1. Chronic left-sided low back pain with left-sided sciatica   2. Muscle weakness (generalized)   3. Contusion of left knee and lower leg, initial encounter   4. Multiple falls   5. Weight loss    Plan: Discussed with [place] that her pain is  multifactorial in nature.  She does have some soft tissue injury and bruising from her falls.  She also has some muscle atrophy as she has lost over 40 pounds in the last 6 months, which I feel is contributing to her pain.  However, most concerning is her low back pain with left leg radiculopathy.  She has significant pain as well as weakness at the level of the knee and the ankle.  She has had multiple falls and does feel the left leg giving out at times.  Her x-ray does suggest about a 30% slippage of L4 on L5 anterior listhesis. Given these, we will proceed with MRI of the lumbar spine to rule out spinal cord or nerve impingement.  I do feel it is prudent to get her into formalized physical therapy both for the low back as well as to help with overall general muscle strengthening.  I did suggest supplementing with protein to help prevent against muscle wasting.  I did advise on supplemental vitamin D and calcium as well for her overall bone health, as she does have a nutritional deficiency.  I do not want to aid in any of her dizziness or medication in balance, however given her severe neural pain, we will start a very low-dose of gabapentin 100 mg nightly, may tolerate to twice daily after  1 week if tolerating well.  We discussed risk/benefit/indications.  This also may help if we are able to uptitrate as this medication has been used in conjunction with alcohol use/withdrawal treatment.  She will follow-up with me after MRI.  Follow-up: Return for F/u after MRI with Dr. Shon Baton.   Meds & Orders:  Meds ordered this encounter  Medications   gabapentin (NEURONTIN) 100 MG capsule    Sig: Take 1 capsule (100 mg total) by mouth 2 (two) times daily.    Dispense:  60 capsule    Refill:  0    Orders Placed This Encounter  Procedures   XR Lumbar Spine 2-3 Views   MR Lumbar Spine w/o contrast   Ambulatory referral to Physical Therapy     Procedures: No procedures performed      Clinical History: No  specialty comments available.  She reports that she has never smoked. She has never used smokeless tobacco. No results for input(s): "HGBA1C", "LABURIC" in the last 8760 hours.  Objective:   Vital Signs: BP 120/68   Ht 5\' 3"  (1.6 m)   Wt 124 lb (56.2 kg)   BMI 21.97 kg/m   Physical Exam  Gen: Well-appearing, in no acute distress; muscle wasting noted Eyes: Scleral icterus noted CV: Well-perfused. Warm.  Resp: Breathing unlabored on room air; no wheezing. Psych: Fluid speech in conversation; appropriate affect; normal thought process Neuro: Sensation intact throughout.  MSK: Seated in wheelchair, difficulty with standing, did not ambulate today in the room secondary to pain.   Ortho Exam - Lumbar spine: Inspection of the spine demonstrated no redness or swelling.  There is tenderness to palpation over the lower spinous process and left-sided SI joint region.  There is global tenderness to palpation throughout the buttock as well.  She does have rather extensive superficial hematoma over the posterior lateral buttock and hip.  She has Ginger flexion and extension with worsening of her pain with flexion.  Positive modified slump test on the left side.  She does have notable weakness of the left compared to the right, more so with knee extension and ankle dorsiflexion, although full strength unable to be appreciated secondary to patient's pain through any range of motion.  Posterior tibial pulse palpated.  - Left hip/LE: There is tenderness to palpation over the left posterior buttock and the greater trochanteric region.  Ginger range of motion through internal and external rotation, although no bony block appreciated.  Unable to test strength given seated in wheelchair and secondary to pain today.  Imaging: XR Lumbar Spine 2-3 Views  Result Date: 09/12/2021 2 views of the lumbar spine including AP and lateral films were ordered and reviewed by myself.  X-rays demonstrate no acute fracture.   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  There is a L4 on L5 anterior listhesis of about 30% slippage.   Past Medical/Family/Surgical/Social History: Medications & Allergies reviewed per EMR, new medications updated. Patient Active Problem List   Diagnosis Date Noted   Pain and swelling of left knee 08/21/2021   Left buttock pain 08/21/2021   Transaminitis 05/23/2021   High anion gap metabolic acidosis 12/14/2020   Fall at home, initial encounter 12/14/2020   Falls frequently 12/14/2020   Syncope 12/14/2020   Folic acid deficiency 12/14/2020   Acute diastolic CHF (congestive heart failure) (HCC) 12/14/2020   Alcohol withdrawal (HCC) 07/08/2020   Prolonged QT interval 05/19/2020   Hypomagnesemia 12/24/2019   Dysphagia 05/31/2019  Chronic anemia 03/31/2018   Appetite loss 03/08/2018   Myofascial pain 03/08/2018   IBS (irritable bowel syndrome) 03/08/2018   Alcoholic hepatitis without ascites 03/05/2018   Elevated LFTs 03/03/2018   Achilles tendonitis 01/28/2018   Abnormal cervical Papanicolaou smear 09/29/2017   Insomnia 09/29/2017   Chest wall pain 05/25/2017   Elevated lipase 05/25/2017   Depression with anxiety 02/23/2017   MRSA carrier 02/23/2017   Hypokalemia 09/24/2016   Chronic alcohol abuse 09/22/2016   H/O acute pancreatitis 09/22/2016   GERD (gastroesophageal reflux disease) 04/09/2015   Epigastric pain 01/17/2014   Acute sinusitis 01/17/2014   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/24/2012   Chest pain 08/20/2012   Stress reaction 07/23/2012   Essential hypertension 06/09/2012   History of pulmonary embolism 06/09/2012   Preop cardiovascular exam 06/08/2012   Dysmenorrhea    Dyspareunia    Pelvic pain    Past Medical History:  Diagnosis Date   Anxiety    Cardiomyopathy (HCC)    Chronic lower back pain    Complication of anesthesia    "hard to get under"   Cyst of ovary    Depression    hx   DVT complicating pregnancy  08/1998   RLE; RUE   Dysmenorrhea    Dyspareunia    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/24/2012   GERD (gastroesophageal reflux disease)    Hay fever    "fall and spring" (02/17/2017)   Headache    "2-3 times/.wik" (02/17/2017)   History of chicken pox    Hypertension    Migraine    "monthly" (02/17/2017)   Pelvic pain    Pulmonary embolism (HCC) 08/1998   S/P childbirth   Family History  Problem Relation Age of Onset   Cancer - Lung Mother    Heart failure Father    Hypertension Father    Past Surgical History:  Procedure Laterality Date   CESAREAN SECTION  1994   ECTOPIC PREGNANCY SURGERY  2010   LAPAROSCOPY N/A 09/24/2012   Procedure: OPERATIVE LAPROSCOPY WITH LYSIS OF ADHESIONS;  Surgeon: Sherron Monday, MD;  Location: [facility];  Service: Gynecology;  Laterality: N/A;   TONSILLECTOMY  1984   TUBAL LIGATION  2010   Social History   Occupational History   Not on file  Tobacco Use   Smoking status: Never   Smokeless tobacco: Never  Vaping Use   Vaping Use: Never used  Substance and Sexual Activity   Alcohol use: Not Currently    Comment: a 5th of liquor per day   Drug use: Yes    Frequency: 1.0 times per week    Types: Marijuana    Comment: Gives you chest pain - trying to quit   Sexual activity: Not Currently    Birth control/protection: Surgical

## 2021-09-13 ENCOUNTER — Telehealth: Payer: BC Managed Care – PPO | Admitting: Family Medicine

## 2021-09-13 ENCOUNTER — Telehealth: Payer: Self-pay | Admitting: Family Medicine

## 2021-09-13 NOTE — Telephone Encounter (Addendum)
[facility] - Client TELEPHONE ADVICE RECORD AccessNurse Patient Name: Nicole Hughes Gender: Female DOB: [birth year] Age: 51 Y 5 M 13 D Return Phone Number: [phone] (Secondary) Address: [address] City/ State/ Zip: [place]  [address] Client [facility] - Client Client Site [facility] - Day Provider Milinda Antis, Idamae Schuller - MD Contact Type Call Who Is Calling Patient / Member / Family / Caregiver Call Type Triage / Clinical Relationship To Patient Self Return Phone Number [phone] (Secondary) Chief Complaint Dizziness Reason for Call Symptomatic / Request for Health Information Initial Comment Caller states she is dizzy all the time and scared to walk. Translation No Nurse Assessment Nurse: Risa Grill, RN, Lambert Mody Date/Time (Eastern Time): 09/13/2021 9:13:12 AM Confirm and document reason for call. If symptomatic, describe symptoms. ---Caller states dizziness. was seen last week. has fallen 4 times in the last month. has a pinched nerve and referred to ortho, started Gabapentin. Dizziness is the same as when seen. wants something called in for nausea Does the patient have any new or worsening symptoms? ---Yes Will a triage be completed? ---Yes Related visit to physician within the last 2 weeks? ---Yes Does the PT have any chronic conditions? (i.e. diabetes, asthma, this includes High risk factors for pregnancy, etc.) ---Yes List chronic conditions. ---HTN Is the patient pregnant or possibly pregnant? (Ask all females between the ages of 55-55) ---No Is this a behavioral health or substance abuse call? ---No Guidelines Guideline Title Affirmed Question Affirmed Notes Nurse Date/Time (Eastern Time) Dizziness - Lightheadedness SEVERE dizziness (e.g., unable to stand, requires support to walk, feels like passing out now) [place], Charity fundraiser, Lambert Mody 09/13/2021 9:15:07 AM PLEASE NOTE:  All timestamps contained within this report are represented as [place] Standard Time. CONFIDENTIALTY NOTICE: This fax transmission is intended only for the addressee. It contains information that is legally privileged, confidential or otherwise protected from use or disclosure. If you are not the intended recipient, you are strictly prohibited from reviewing, disclosing, copying using or disseminating any of this information or taking any action in reliance on or regarding this information. If you have received this fax in error, please notify us immediately by telephone so that we can arrange for its return to [place]. Phone: [phone], Toll-Free: [phone], Fax: [phone] Page: 2 of 2 Call Id: [id number] Disp. Time Lamount Cohen Time) Disposition Final User 09/13/2021 9:02:02 AM Attempt made - message left Tobie Poet, Lambert Mody 09/13/2021 9:18:41 AM Go to ED Now (or PCP triage) Yes Risa Grill, RN, Lambert Mody Final Disposition 09/13/2021 9:18:41 AM Go to ED Now (or PCP triage) Yes Risa Grill, RN, Cyndia Diver Disagree/Comply Comply Caller Understands Yes PreDisposition Call Doctor Care Advice Given Per Guideline GO TO ED NOW (OR PCP TRIAGE): * IF NO PCP (PRIMARY CARE PROVIDER) SECOND-LEVEL TRIAGE: You need to be seen within the next hour. Go to the ED/UCC at _____________ Hospital. Leave as soon as you can. ANOTHER ADULT SHOULD DRIVE: * It is better and safer if another adult drives instead of you. BRING MEDICINES: * Bring a list of your current medicines when you go to the Emergency Department (ER). CARE ADVICE given per Dizziness (Adult) guideline. Comments User: Theresa Mulligan, RN Date/Time Lamount Cohen Time): 09/13/2021 9:18:15 AM nausea is new User: Theresa Mulligan, RN Date/Time Lamount Cohen Time): 09/13/2021 9:19:16 AM requests phone visit with office User: Theresa Mulligan, RN Date/Time Lamount Cohen Time): 09/13/2021 9:20:09 AM drinking water, ensure, and gatorade User: Theresa Mulligan, RN Date/Time Lamount Cohen  Time): 09/13/2021  9:26:08 AM warm transferred to office for virtual visit User: Theresa Mulligan, RN Date/Time Lamount Cohen Time): 09/13/2021 9:27:13 AM [facility] is scheduling a MRI User: Theresa Mulligan, RN Date/Time Lamount Cohen Time): 09/13/2021 9:27:43 AM requests for PCP to look at her x rays Referrals REFERRED TO PCP OFFIC

## 2021-09-13 NOTE — Telephone Encounter (Signed)
Patient called and stated she is dizzy all the time and is afraid to walk. Also stated that she want Dr. Milinda Antis to look at her results. Patient was sent to access nurse. Patient stated she doesn't have a car.

## 2021-09-13 NOTE — Telephone Encounter (Addendum)
Pt is already scheduled for video visit 09/13/21 at 4 PM with Dr Milinda Antis. Per note under access note pt does not have transportation. Sending note to Dr Milinda Antis and [place] CMA and will teams [place].

## 2021-09-15 NOTE — Telephone Encounter (Signed)
Pt no showed for her visit.

## 2021-10-08 ENCOUNTER — Telehealth: Payer: Self-pay | Admitting: *Deleted

## 2021-10-08 NOTE — Patient Outreach (Signed)
  Care Coordination   10/08/2021 Name: Nicole Hughes MRN: [id number] DOB: 30-Mar-1970   Care Coordination Outreach Attempts:  A second unsuccessful outreach was attempted today to offer the patient with information about available care coordination services as a benefit of their health plan.     Follow Up Plan:  Additional outreach attempts will be made to offer the patient care coordination information and services.   Encounter Outcome:  No Answer  Care Coordination Interventions Activated:  No   Care Coordination Interventions:  No, not indicated    Bentlie Withem, Sandy Worker  [facility] Care Management [phone]

## 2021-10-16 ENCOUNTER — Encounter: Payer: Self-pay | Admitting: *Deleted

## 2021-11-15 ENCOUNTER — Emergency Department (HOSPITAL_COMMUNITY): Payer: BC Managed Care – PPO

## 2021-11-15 ENCOUNTER — Other Ambulatory Visit (HOSPITAL_COMMUNITY): Payer: BC Managed Care – PPO

## 2021-11-15 ENCOUNTER — Other Ambulatory Visit: Payer: Self-pay

## 2021-11-15 ENCOUNTER — Emergency Department (HOSPITAL_COMMUNITY)
Admission: EM | Admit: 2021-11-15 | Discharge: 2021-12-06 | Disposition: E | Payer: BC Managed Care – PPO | Attending: Emergency Medicine | Admitting: Emergency Medicine

## 2021-11-15 DIAGNOSIS — J9601 Acute respiratory failure with hypoxia: Secondary | ICD-10-CM | POA: Diagnosis not present

## 2021-11-15 DIAGNOSIS — Z79899 Other long term (current) drug therapy: Secondary | ICD-10-CM | POA: Insufficient documentation

## 2021-11-15 DIAGNOSIS — R600 Localized edema: Secondary | ICD-10-CM | POA: Diagnosis not present

## 2021-11-15 DIAGNOSIS — G935 Compression of brain: Secondary | ICD-10-CM

## 2021-11-15 DIAGNOSIS — Z4682 Encounter for fitting and adjustment of non-vascular catheter: Secondary | ICD-10-CM | POA: Diagnosis not present

## 2021-11-15 DIAGNOSIS — I615 Nontraumatic intracerebral hemorrhage, intraventricular: Secondary | ICD-10-CM | POA: Diagnosis not present

## 2021-11-15 DIAGNOSIS — J9600 Acute respiratory failure, unspecified whether with hypoxia or hypercapnia: Secondary | ICD-10-CM

## 2021-11-15 DIAGNOSIS — X58XXXA Exposure to other specified factors, initial encounter: Secondary | ICD-10-CM | POA: Insufficient documentation

## 2021-11-15 DIAGNOSIS — R000 Tachycardia, unspecified: Secondary | ICD-10-CM | POA: Diagnosis not present

## 2021-11-15 DIAGNOSIS — G919 Hydrocephalus, unspecified: Secondary | ICD-10-CM | POA: Diagnosis not present

## 2021-11-15 DIAGNOSIS — Z66 Do not resuscitate: Secondary | ICD-10-CM

## 2021-11-15 DIAGNOSIS — R29818 Other symptoms and signs involving the nervous system: Secondary | ICD-10-CM | POA: Diagnosis not present

## 2021-11-15 DIAGNOSIS — A419 Sepsis, unspecified organism: Secondary | ICD-10-CM | POA: Diagnosis not present

## 2021-11-15 DIAGNOSIS — Z7901 Long term (current) use of anticoagulants: Secondary | ICD-10-CM | POA: Diagnosis not present

## 2021-11-15 DIAGNOSIS — R109 Unspecified abdominal pain: Secondary | ICD-10-CM | POA: Insufficient documentation

## 2021-11-15 DIAGNOSIS — S06364A Traumatic hemorrhage of cerebrum, unspecified, with loss of consciousness of 6 hours to 24 hours, initial encounter: Secondary | ICD-10-CM | POA: Diagnosis not present

## 2021-11-15 DIAGNOSIS — R579 Shock, unspecified: Secondary | ICD-10-CM

## 2021-11-15 DIAGNOSIS — G936 Cerebral edema: Secondary | ICD-10-CM

## 2021-11-15 DIAGNOSIS — K7689 Other specified diseases of liver: Secondary | ICD-10-CM | POA: Diagnosis not present

## 2021-11-15 DIAGNOSIS — S06334A Contusion and laceration of cerebrum, unspecified, with loss of consciousness of 6 hours to 24 hours, initial encounter: Secondary | ICD-10-CM

## 2021-11-15 DIAGNOSIS — J984 Other disorders of lung: Secondary | ICD-10-CM | POA: Diagnosis not present

## 2021-11-15 DIAGNOSIS — I619 Nontraumatic intracerebral hemorrhage, unspecified: Secondary | ICD-10-CM

## 2021-11-15 DIAGNOSIS — S0990XA Unspecified injury of head, initial encounter: Secondary | ICD-10-CM | POA: Diagnosis not present

## 2021-11-15 LAB — CBC WITH DIFFERENTIAL/PLATELET
Abs Immature Granulocytes: 0.03 10*3/uL (ref 0.00–0.07)
Basophils Absolute: 0.1 10*3/uL (ref 0.0–0.1)
Basophils Relative: 1 %
Eosinophils Absolute: 0 10*3/uL (ref 0.0–0.5)
Eosinophils Relative: 0 %
HCT: 25.9 % — ABNORMAL LOW (ref 36.0–46.0)
Hemoglobin: 8.1 g/dL — ABNORMAL LOW (ref 12.0–15.0)
Immature Granulocytes: 1 %
Lymphocytes Relative: 12 %
Lymphs Abs: 0.6 10*3/uL — ABNORMAL LOW (ref 0.7–4.0)
MCH: 28.4 pg (ref 26.0–34.0)
MCHC: 31.3 g/dL (ref 30.0–36.0)
MCV: 90.9 fL (ref 80.0–100.0)
Monocytes Absolute: 0.4 10*3/uL (ref 0.1–1.0)
Monocytes Relative: 7 %
Neutro Abs: 4.1 10*3/uL (ref 1.7–7.7)
Neutrophils Relative %: 79 %
Platelets: 75 10*3/uL — ABNORMAL LOW (ref 150–400)
RBC: 2.85 MIL/uL — ABNORMAL LOW (ref 3.87–5.11)
RDW: 20.5 % — ABNORMAL HIGH (ref 11.5–15.5)
WBC: 5.2 10*3/uL (ref 4.0–10.5)
nRBC: 0 % (ref 0.0–0.2)

## 2021-11-15 LAB — RAPID URINE DRUG SCREEN, HOSP PERFORMED
Amphetamines: NOT DETECTED
Barbiturates: NOT DETECTED
Benzodiazepines: NOT DETECTED
Cocaine: NOT DETECTED
Opiates: NOT DETECTED
Tetrahydrocannabinol: POSITIVE — AB

## 2021-11-15 LAB — COMPREHENSIVE METABOLIC PANEL
ALT: 33 U/L (ref 0–44)
AST: 171 U/L — ABNORMAL HIGH (ref 15–41)
Albumin: 2.8 g/dL — ABNORMAL LOW (ref 3.5–5.0)
Alkaline Phosphatase: 71 U/L (ref 38–126)
Anion gap: 19 — ABNORMAL HIGH (ref 5–15)
BUN: 27 mg/dL — ABNORMAL HIGH (ref 6–20)
CO2: 22 mmol/L (ref 22–32)
Calcium: 8.8 mg/dL — ABNORMAL LOW (ref 8.9–10.3)
Chloride: 94 mmol/L — ABNORMAL LOW (ref 98–111)
Creatinine, Ser: 0.87 mg/dL (ref 0.44–1.00)
GFR, Estimated: >60 mL/min (ref >60)
Glucose, Bld: 97 mg/dL (ref 70–99)
Potassium: 4.3 mmol/L (ref 3.5–5.1)
Sodium: 135 mmol/L (ref 135–145)
Total Bilirubin: 3.3 mg/dL — ABNORMAL HIGH (ref 0.3–1.2)
Total Protein: 6.8 g/dL (ref 6.5–8.1)

## 2021-11-15 LAB — URINALYSIS, ROUTINE W REFLEX MICROSCOPIC
Bilirubin Urine: NEGATIVE
Glucose, UA: NEGATIVE mg/dL
Hgb urine dipstick: NEGATIVE
Ketones, ur: 5 mg/dL — AB
Leukocytes,Ua: NEGATIVE
Nitrite: NEGATIVE
Protein, ur: NEGATIVE mg/dL
Specific Gravity, Urine: 1.02 (ref 1.005–1.030)
pH: 5 (ref 5.0–8.0)

## 2021-11-15 LAB — TSH: TSH: 0.873 u[IU]/mL (ref 0.350–4.500)

## 2021-11-15 LAB — AMMONIA: Ammonia: 42 umol/L — ABNORMAL HIGH (ref 9–35)

## 2021-11-15 LAB — LACTIC ACID, PLASMA
Lactic Acid, Venous: 3.7 mmol/L (ref 0.5–1.9)
Lactic Acid, Venous: 4.3 mmol/L (ref 0.5–1.9)

## 2021-11-15 LAB — CBG MONITORING, ED: Glucose-Capillary: 110 mg/dL — ABNORMAL HIGH (ref 70–99)

## 2021-11-15 LAB — PROTIME-INR
INR: 1.9 — ABNORMAL HIGH (ref 0.8–1.2)
Prothrombin Time: 21.9 seconds — ABNORMAL HIGH (ref 11.4–15.2)

## 2021-11-15 MED ORDER — SODIUM CHLORIDE 0.9 % IV SOLN
2.0000 g | Freq: Once | INTRAVENOUS | Status: DC
  Filled 2021-11-15: qty 12.5

## 2021-11-15 MED ORDER — NALOXONE HCL 2 MG/2ML IJ SOSY
PREFILLED_SYRINGE | INTRAMUSCULAR | Status: AC
  Filled 2021-11-15: qty 2

## 2021-11-15 MED ORDER — NOREPINEPHRINE 4 MG/250ML-% IV SOLN
INTRAVENOUS | Status: DC | PRN
  Administered 2021-11-15: 20 ug/min via INTRAVENOUS

## 2021-11-15 MED ORDER — IOHEXOL 350 MG/ML SOLN
80.0000 mL | Freq: Once | INTRAVENOUS | Status: AC | PRN
  Administered 2021-11-15: 80 mL via INTRAVENOUS

## 2021-11-15 MED ORDER — ROCURONIUM BROMIDE 50 MG/5ML IV SOLN
INTRAVENOUS | Status: DC | PRN
  Administered 2021-11-15: 100 mg via INTRAVENOUS

## 2021-11-15 MED ORDER — FENTANYL CITRATE PF 50 MCG/ML IJ SOSY: 50.0000 ug | PREFILLED_SYRINGE | INTRAMUSCULAR | Status: DC | PRN

## 2021-11-15 MED ORDER — VANCOMYCIN HCL 500 MG/100ML IV SOLN: 500.0000 mg | Freq: Two times a day (BID) | INTRAVENOUS | Status: DC

## 2021-11-15 MED ORDER — FENTANYL CITRATE PF 50 MCG/ML IJ SOSY
50.0000 ug | PREFILLED_SYRINGE | INTRAMUSCULAR | Status: DC | PRN
  Administered 2021-11-16: 100 ug via INTRAVENOUS
  Filled 2021-11-15: qty 4

## 2021-11-15 MED ORDER — ETOMIDATE 2 MG/ML IV SOLN
INTRAVENOUS | Status: DC | PRN
  Administered 2021-11-15: 20 mg via INTRAVENOUS

## 2021-11-15 MED ORDER — NALOXONE HCL 2 MG/2ML IJ SOSY
PREFILLED_SYRINGE | INTRAMUSCULAR | Status: DC | PRN
  Administered 2021-11-15: 1 mg via INTRAVENOUS

## 2021-11-15 MED ORDER — VANCOMYCIN HCL 1250 MG/250ML IV SOLN
1250.0000 mg | Freq: Once | INTRAVENOUS | Status: DC
  Filled 2021-11-15: qty 250

## 2021-11-15 MED ORDER — NOREPINEPHRINE 4 MG/250ML-% IV SOLN
INTRAVENOUS | Status: AC
  Administered 2021-11-15: 40 ug/min via INTRAVENOUS
  Filled 2021-11-15: qty 250

## 2021-11-15 MED ORDER — PROPOFOL 1000 MG/100ML IV EMUL
5.0000 ug/kg/min | INTRAVENOUS | Status: DC
  Administered 2021-11-15: 10 ug/kg/min via INTRAVENOUS

## 2021-11-15 NOTE — ED Notes (Signed)
MD notified of Pt's updated blood pressure.

## 2021-11-15 NOTE — Progress Notes (Signed)
Pharmacy Antibiotic Note  Nicole Hughes is a 51 y.o. female admitted on 11/08/2021 presenting unresponsive, concern for sepsis.  Pharmacy has been consulted for vancomycin and cefepime dosing.  Plan: Vancomycin 1250 mg IV x 1, then 500 mg IV q 12h Cefepime 2g IV every 8 hours Monitor renal function, Cx and clinical progression to narrow Vancomycin levels as indicated  Height: 5\' 4"  (162.6 cm) Weight: 60 kg (132 lb 4.4 oz) IBW/kg (Calculated) : 54.7  No data recorded.  Recent Labs  Lab 11/09/2021 2051  WBC 5.2  CREATININE 0.87  LATICACIDVEN 3.7*    Estimated Creatinine Clearance: 67.5 mL/min (by C-G formula based on SCr of 0.87 mg/dL).    No Known Allergies  2052, PharmD Clinical Pharmacist ED Pharmacist Phone # [phone] 12/02/2021 10:26 PM

## 2021-11-15 NOTE — ED Triage Notes (Signed)
Pt to room from lobby as unresponsive. Per family, pt was asleep and they tried to wake her up approx 1 hour ago and was unable to. Pt unresponsive to painful stimuli on arrival to room - MD at bedside.

## 2021-11-15 NOTE — IPAL (Signed)
Interdisciplinary Goals of Care Family Meeting   Date carried out:: 2021-11-28  Location of the meeting: Bedside  Member's involved: Physician, Bedside Registered Nurse, and Family Member or next of kin  Durable Power of Attorney or acting medical decision maker: Verlin Fester      Discussion: We discussed goals of care for [employer] .    The Clinical status was relayed to whole family including patient's husband and 2 daughters and other family members at bedside in detail.   Updated and notified of patients medical condition.     Patient remains unresponsive and will not open eyes to command.   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  Code status: Full DNR  Disposition: In-patient comfort care    Family are satisfied with Plan of action and management. All questions answered   Cheri Fowler MD [facility] See Amion for pager If no response to pager, please call [phone] until 7pm After 7pm, Please call E-link [phone]

## 2021-11-15 NOTE — ED Notes (Signed)
Pt intubated 26 @ lip with bilateral breath sounds.

## 2021-11-15 NOTE — ED Provider Notes (Incomplete)
[facility] EMERGENCY DEPARTMENT Provider Note   CSN: [id number] Arrival date & time: November 20, 2021  2020     History {Add pertinent medical, surgical, social history, OB history to HPI:1} Chief Complaint  Patient presents with   Unresponsive     Nicole Hughes is a 51 year old female with history of alcohol use disorder who presents to the emergency department for unresponsiveness.  The patient's family report that she was "acting strange" since 40 PM, and kept laying her head down on the table, was more tired than normal, though was periodically responding to them.  Then around 7 PM they went to try to wake her up from sleep and she was not responsive so they called 911.  They report that she drinks alcohol daily and last drink today, and also takes pain medications though they are unsure which ones she may have taken.  The history is provided by the EMS personnel and a relative.       Home Medications Prior to Admission medications   Medication Sig Start Date End Date Taking? Authorizing Provider  acetaminophen (TYLENOL) 500 MG tablet Take 1 tablet (500 mg total) by mouth every 8 (eight) hours as needed for moderate pain or mild pain. 06/02/21 06/02/22  Leroy Sea, MD  amLODipine (NORVASC) 10 MG tablet Take 1 tablet (10 mg total) by mouth daily. 09/05/20   Tower, Audrie Gallus, MD  apixaban (ELIQUIS) 5 MG TABS tablet Take 2 tablets (10 mg total) by mouth 2 (two) times daily for 6 days. 06/02/21 06/08/21  Leroy Sea, MD  apixaban (ELIQUIS) 5 MG TABS tablet Take 1 tablet (5 mg total) by mouth 2 (two) times daily. 06/09/21   Leroy Sea, MD  Carboxymethylcellul-Glycerin (CLEAR EYES FOR DRY EYES OP) Place 1 drop into both eyes daily as needed (dryness).    [provider]  colchicine 0.6 MG tablet Take 1 tablet (0.6 mg total) by mouth daily for 5 days. 06/02/21 08/06/21  Leroy Sea, MD  cyclobenzaprine (FLEXERIL) 10 MG tablet Take 0.5-1 tablets (5-10  mg total) by mouth 3 (three) times daily as needed for muscle spasms (leg pain). Caution of sedation 08/26/21   Tower, Audrie Gallus, MD  gabapentin (NEURONTIN) 100 MG capsule Take 1 capsule (100 mg total) by mouth 2 (two) times daily. 09/12/21 10/12/21  Madelyn Brunner, DO  lactulose, encephalopathy, (CHRONULAC) 10 GM/15ML SOLN Take 30 mLs (20 g total) by mouth 2 (two) times daily. 12/20/20   Marinda Elk, MD  Magnesium Oxide 400 MG CAPS Take 2 capsules (800 mg total) by mouth 2 (two) times daily. 06/02/21   Leroy Sea, MD  metoprolol succinate (TOPROL-XL) 100 MG 24 hr tablet Take 1 tablet (100 mg total) by mouth daily. Take with or immediately following a meal. 08/21/21   Tower, Audrie Gallus, MD  mirtazapine (REMERON) 15 MG tablet Take 1 tablet (15 mg total) by mouth at bedtime. 06/02/21   Leroy Sea, MD  pantoprazole (PROTONIX) 40 MG tablet Take 1 tablet (40 mg total) by mouth daily. 07/30/21   Terrilee Files, MD      Allergies    Patient has no known allergies.    Review of Systems   Review of Systems Unable to obtain.   Physical Exam Updated Vital Signs BP (!) 141/78   Pulse (!) 129   Resp 20   SpO2 100%  Physical Exam Vitals and nursing note reviewed.  Constitutional:      Appearance:  She is toxic-appearing.     Comments: Jaundiced.  HENT:     Head: Normocephalic and atraumatic.     Comments: Small hematoma above right eyebrow.    Mouth/Throat:     Pharynx: Oropharynx is clear.  Eyes:     Pupils: Pupils are equal, round, and reactive to light.     Comments: Pupils 6 mm, equal and reactive.  Cardiovascular:     Rate and Rhythm: Normal rate.  Abdominal:     General: There is distension.     Palpations: Abdomen is soft.     Comments: Abdomen soft, distended.  Positive fluid wave.  Musculoskeletal:     Right lower leg: Edema present.     Left lower leg: Edema present.     Comments: 2+ bilateral lower extremity pitting edema to knee.  Skin:    General: Skin is warm  and dry.  Neurological:     Comments: GCS 3.     ED Results / Procedures / Treatments   Labs (all labs ordered are listed, but only abnormal results are displayed) Labs Reviewed  CBG MONITORING, ED - Abnormal; Notable for the following components:      Result Value   Glucose-Capillary 110 (*)    All other components within normal limits    EKG None  Radiology No results found.  Procedures Date/Time: 11/09/2021 8:42 PM  Performed by: Stephanie Coup, MDTube size: 7.5 mm Placement Confirmation: Positive ETCO2, Breath sounds checked- equal and bilateral, CO2 detector and ETT inserted through vocal cords under direct vision Tube secured with: ETT holder      {Document cardiac monitor, telemetry assessment procedure when appropriate:1}  Medications Ordered in ED Medications  naloxone (NARCAN) injection ( Intravenous Canceled Entry 11/14/2021 2030)  etomidate (AMIDATE) injection (20 mg Intravenous Given 11/25/2021 2036)  rocuronium (ZEMURON) injection (100 mg Intravenous Given 11/17/2021 2037)  propofol (DIPRIVAN) 1000 MG/100ML infusion (10 mcg/kg/min Intravenous New Bag/Given 11/17/2021 2045)    ED Course/ Medical Decision Making/ A&P                           Medical Decision Making Amount and/or Complexity of Data Reviewed Labs: ordered. Radiology: ordered.  Risk Prescription drug management.   ***  Narcan trialed on arrival given reported pain medication use.  No change after Narcan, proceeded with intubation.  Patient preoxygenated and intubated without difficulty.  OG tube placed.  Initial concern highest for hepatic encephalopathy/hyperammonemia, sepsis, potential intracranial abnormality given facial hematoma and altered mental status.  Will obtain labs and CT head, abdomen pelvis.  EKG on my independent review shows sinus tachycardia, 119 bpm.  No STEMI.  Work-up: CXR on my independent review demonstrates no focal consolidation to suggest pneumonia, no  obvious pulmonary edema or pneumothorax, no pleural effusion.  Endotracheal tube is at the level of the carina, subsequently pulled back by RT. CT head    1. Large intraparenchymal hematoma, possibly centered in the right basal ganglia, which measures up to 8.1 cm in greatest dimension significant surrounding edema and 9 mm of right-to-left midline shift and effacement of the basilar cisterns. 2. Ventricular extension throughout the lateral, third, and fourth ventricles, with hydrocephalus.   {Document critical care time when appropriate:1} {Document review of labs and clinical decision tools ie heart score, Chads2Vasc2 etc:1}  {Document your independent review of radiology images, and any outside records:1} {Document your discussion with family members, caretakers, and with consultants:1} {Document social determinants of health affecting  pt's care:1} {Document your decision making why or why not admission, treatments were needed:1} Final Clinical Impression(s) / ED Diagnoses Final diagnoses:  None    Rx / DC Orders ED Discharge Orders     None

## 2021-11-15 NOTE — H&P (Addendum)
NAME:  Nicole Hughes, MRN:  [id number], DOB:  [birth year], LOS: 0 ADMISSION DATE:  2021-11-29, CONSULTATION DATE:  11/10 REFERRING MD:  Adela Lank, CHIEF COMPLAINT:  AMS   History of Present Illness:  Patient is encephalopathic and/or intubated. Therefore history has been obtained from chart review and patients family.  Nicole Hughes, is a 51 y.o. female, who presented to the [facility] ED with a chief complaint of AMS  They have a pertinent past medical history of DVT on eliquis, GERD, anxiety, depression, alcohol use disorder, HTN, pancreatitis  LKW 1030 per family. Some reports of patient complaining of headache earlier in the day. Around 1230 patient developed L facial droop and weakness, began acting "out of her mind" per family. Patient got in chair. Last seen at 1645. When family returned at 1800 patient was unresponsive. EMS called.  She was intubated in the ED. A code stroke was activated. CT head demonstrated Large 8.1 cm intraparenchymal hematoma with significant surrounding edema and 9 mm of right-to-left midline shift and effacement of the basilar cisterns, with ventricular extension throughout the lateral, third, and fourth ventricles, with hydrocephalus. This was deemed non survivable by Dr. Merrily Pew and Dr. Iver Nestle.  PCCM was consulted for admission.  Pertinent  Medical History  DVT on eliquis, GERD, anxiety, depression, alcohol use disorder, HTN, pancreatitis  Significant Hospital Events: Including procedures, antibiotic start and stop dates in addition to other pertinent events   11/10- Presented to [facility], Code stroke activated, ETT  Interim History / Subjective:  See above  Unable to obtain subjective evaluation due to patient status  Objective   Blood pressure 123/75, pulse (!) 127, temperature (!) 94.4 F (34.7 C), resp. rate (!) 23, height 5\' 4"  (1.626 m), weight 60 kg, SpO2 98 %.    FiO2 (%):  [100 %] 100 % Set Rate:  [16 bmp] 16 bmp Vt Set:  [430 mL] 430 mL PEEP:  [5  cmH20] 5 cmH20 Plateau Pressure:  [16 cmH20] 16 cmH20  No intake or output data in the 24 hours ending November 29, 2021 2351 Filed Weights   Nov 29, 2021 2042  Weight: 60 kg    Examination: General: In bed, intubated HEENT: MM pink/moist, anicteric, atraumatic Neuro: RASS -5, 6 mm non reactive, scant movement on lowers, no cough CV: S1S2, ST, no m/r/g appreciated PULM:  clear in the upper lobes, clear in the lower lobes, trachea midline, chest expansion symmetric GI: soft, rounded, hypoactive Extremities: warm/dry, no pretibial edema, capillary refill less than 3 seconds  Skin:  no rashes or lesions noted  Labs/Imaging HGB 8.1 PLT 75 AST 171 Albumin 2.8 AG 19 Ammonia 42 Lactic 3.7> 4.3 Inr 1.9  CT abdomen pelvis w contrast: BL lower lobe aspiration. Per rads- Distended gallbladder without stones, liver appearance suggestive of disease, moderate volume ascites CT head per rads: 1. Large intraparenchymal hematoma, possibly centered in the right basal ganglia, which measures up to 8.1 cm in greatest dimension significant surrounding edema and 9 mm of right-to-left midline shift and effacement of the basilar cisterns. 2. Ventricular extension throughout the lateral, third, and fourth ventricles, with hydrocephalus  Resolved Hospital Problem list     Assessment & Plan:  8.1cm IPH with ventricular extension throughout the lateral, third, and fourth ventricles with hydrocephalus Shock, suspect secondary to impending herniation Midline shift with brain compression due to above Acute respiratory failure secondary to IPH Acute on chronic normocytic anemia, suspect secondary to chronic ETOH use on head bleed Acute on chronic thrombocytopenia, suspect secondary to  chronic ETOH use on head bleed Hx of DVT on eliquis Ammonia elevation Lactic acidosis, suspect secondary to head bleed. Transaminitis, uspect secondary to chronic ETOH use DNR Patient with hx of dvt on eliquis. Suspect stroke with  hemorraghic conversion.  -Admit to ICU. -Goal MAP 65. On of levophed. -This neurologic injury was deemed non survivable by Dr. Merrily Pew and Dr. Iver Nestle. Considering nature of injury continue GOC conversations. GOC conversation ongoing with Dr. Merrily Pew. -DNR orders placed. -Continue respiratory support at this time -Further orders pending family discussion.   Best Practice (right click and "Reselect all SmartList Selections" daily)   Diet/type: NPO DVT prophylaxis: SCD GI prophylaxis: PPI Lines: N/A Foley:  N/A Code Status:  DNR Last date of multidisciplinary goals of care discussion [pending]  Labs   CBC: Recent Labs  Lab 11/24/2021 2051  WBC 5.2  NEUTROABS 4.1  HGB 8.1*  HCT 25.9*  MCV 90.9  PLT 75*    Basic Metabolic Panel: Recent Labs  Lab November 24, 2021 2051  NA 135  K 4.3  CL 94*  CO2 22  GLUCOSE 97  BUN 27*  CREATININE 0.87  CALCIUM 8.8*   GFR: Estimated Creatinine Clearance: 67.5 mL/min (by C-G formula based on SCr of 0.87 mg/dL). Recent Labs  Lab 11/24/2021 2051 2021-11-24 2242  WBC 5.2  --   LATICACIDVEN 3.7* 4.3*    Liver Function Tests: Recent Labs  Lab 11-24-2021 2051  AST 171*  ALT 33  ALKPHOS 71  BILITOT 3.3*  PROT 6.8  ALBUMIN 2.8*   No results for input(s): "LIPASE", "AMYLASE" in the last 168 hours. Recent Labs  Lab 11-24-21 2051  AMMONIA 42*    ABG    Component Value Date/Time   HCO3 29.5 (H) 05/19/2020 1204   TCO2 31 05/19/2020 1204   O2SAT 84.0 05/19/2020 1204     Coagulation Profile: Recent Labs  Lab 24-Nov-2021 2051  INR 1.9*    Cardiac Enzymes: No results for input(s): "CKTOTAL", "CKMB", "CKMBINDEX", "TROPONINI" in the last 168 hours.  HbA1C: No results found for: "HGBA1C"  CBG: Recent Labs  Lab 2021-11-24 2026  GLUCAP 110*    Review of Systems:   Unable to obtain ROS   Past Medical History:  She,  has a past medical history of Anxiety, Cardiomyopathy (HCC), Chronic lower back pain, Complication of anesthesia,  Cyst of ovary, Depression, DVT complicating pregnancy (08/1998), Dysmenorrhea, Dyspareunia, Endometriosis of pelvic peritoneum (09/24/2012), GERD (gastroesophageal reflux disease), Hay fever, Headache, History of chicken pox, Hypertension, Migraine, Pelvic pain, and Pulmonary embolism (HCC) (08/1998).   Surgical History:   Past Surgical History:  Procedure Laterality Date   CESAREAN SECTION  1994   ECTOPIC PREGNANCY SURGERY  2010   LAPAROSCOPY N/A 09/24/2012   Procedure: OPERATIVE LAPROSCOPY WITH LYSIS OF ADHESIONS;  Surgeon: Sherron Monday, MD;  Location: [facility];  Service: Gynecology;  Laterality: N/A;   TONSILLECTOMY  1984   TUBAL LIGATION  2010     Social History:   reports that she has never smoked. She has never used smokeless tobacco. She reports that she does not currently use alcohol. She reports current drug use. Frequency: 1.00 time per week. Drug: Marijuana.   Family History:  Her family history includes Cancer - Lung in her mother; Heart failure in her father; Hypertension in her father.   Allergies No Known Allergies   Home Medications  Prior to Admission medications   Medication Sig Start Date End Date Taking? Authorizing Provider  acetaminophen (TYLENOL) 500 MG tablet Take  1 tablet (500 mg total) by mouth every 8 (eight) hours as needed for moderate pain or mild pain. 06/02/21 06/02/22  Leroy Sea, MD  amLODipine (NORVASC) 10 MG tablet Take 1 tablet (10 mg total) by mouth daily. 09/05/20   Tower, Audrie Gallus, MD  apixaban (ELIQUIS) 5 MG TABS tablet Take 2 tablets (10 mg total) by mouth 2 (two) times daily for 6 days. 06/02/21 06/08/21  Leroy Sea, MD  apixaban (ELIQUIS) 5 MG TABS tablet Take 1 tablet (5 mg total) by mouth 2 (two) times daily. 06/09/21   Leroy Sea, MD  Carboxymethylcellul-Glycerin (CLEAR EYES FOR DRY EYES OP) Place 1 drop into both eyes daily as needed (dryness).    [provider]  colchicine 0.6 MG tablet Take 1 tablet (0.6 mg total) by  mouth daily for 5 days. 06/02/21 08/06/21  Leroy Sea, MD  cyclobenzaprine (FLEXERIL) 10 MG tablet Take 0.5-1 tablets (5-10 mg total) by mouth 3 (three) times daily as needed for muscle spasms (leg pain). Caution of sedation 08/26/21   Tower, Audrie Gallus, MD  gabapentin (NEURONTIN) 100 MG capsule Take 1 capsule (100 mg total) by mouth 2 (two) times daily. 09/12/21 10/12/21  Madelyn Brunner, DO  lactulose, encephalopathy, (CHRONULAC) 10 GM/15ML SOLN Take 30 mLs (20 g total) by mouth 2 (two) times daily. 12/20/20   Marinda Elk, MD  Magnesium Oxide 400 MG CAPS Take 2 capsules (800 mg total) by mouth 2 (two) times daily. 06/02/21   Leroy Sea, MD  metoprolol succinate (TOPROL-XL) 100 MG 24 hr tablet Take 1 tablet (100 mg total) by mouth daily. Take with or immediately following a meal. 08/21/21   Tower, Audrie Gallus, MD  mirtazapine (REMERON) 15 MG tablet Take 1 tablet (15 mg total) by mouth at bedtime. 06/02/21   Leroy Sea, MD  pantoprazole (PROTONIX) 40 MG tablet Take 1 tablet (40 mg total) by mouth daily. 07/30/21   Terrilee Files, MD     Critical care time: 50 minutes    The patient is critically ill with multiple organ systems failure and requires high complexity decision making for assessment and support, frequent evaluation and titration of therapies, application of advanced monitoring technologies and extensive interpretation of multiple databases.    Critical Care Time devoted to patient care services described in this note is 50 minutes. This time reflects time of care of this signee Karle Barr NP. This critical care time does not reflect procedure time but could involve care discussion time with the PCCM attending.  Gershon Mussel., MSN, APRN, AGACNP-BC [facility]  12-13-21 , 11:53 PM  Please see [email or web address] for pager details  If no response, please call [phone] After hours, please call Elink at [phone]

## 2021-11-16 DIAGNOSIS — Z66 Do not resuscitate: Secondary | ICD-10-CM

## 2021-11-16 DIAGNOSIS — G935 Compression of brain: Secondary | ICD-10-CM

## 2021-11-16 DIAGNOSIS — J9600 Acute respiratory failure, unspecified whether with hypoxia or hypercapnia: Secondary | ICD-10-CM

## 2021-11-16 MED ORDER — GLYCOPYRROLATE 0.2 MG/ML IJ SOLN: 0.2000 mg | INTRAMUSCULAR | Status: DC | PRN

## 2021-11-16 MED ORDER — GLYCOPYRROLATE 1 MG PO TABS: 1.0000 mg | ORAL_TABLET | ORAL | Status: DC | PRN

## 2021-11-16 MED ORDER — ACETAMINOPHEN 650 MG RE SUPP: 650.0000 mg | Freq: Four times a day (QID) | RECTAL | Status: DC | PRN

## 2021-11-16 MED ORDER — ACETAMINOPHEN 325 MG PO TABS: 650.0000 mg | ORAL_TABLET | Freq: Four times a day (QID) | ORAL | Status: DC | PRN

## 2021-11-16 MED ORDER — SODIUM CHLORIDE 0.9 % IV SOLN: INTRAVENOUS | Status: DC

## 2021-11-16 MED ORDER — MORPHINE SULFATE (PF) 2 MG/ML IV SOLN: 2.0000 mg | INTRAVENOUS | Status: DC | PRN

## 2021-11-16 MED ORDER — POLYVINYL ALCOHOL 1.4 % OP SOLN: 1.0000 [drp] | Freq: Four times a day (QID) | OPHTHALMIC | Status: DC | PRN

## 2021-11-21 LAB — CULTURE, BLOOD (ROUTINE X 2)
Culture: NO GROWTH
Culture: NO GROWTH
Special Requests: ADEQUATE
Special Requests: ADEQUATE

## 2021-12-06 NOTE — ED Notes (Signed)
Request for levo to be turned off per family because all family members were available. Verified with MD Gelmann and then levo turned off. CCM to bedside for comfort care medications and extubation. Extubation completed by RT.

## 2021-12-06 NOTE — ED Notes (Signed)
Pt's BP noted to be hypotensive. Verbal order per MD Gelmann to start levo at recorded rate. Family requests for levophed to be left on until the rest of their family members arrive.

## 2021-12-06 NOTE — Plan of Care (Signed)
Late entry.  I was consulted for this patient by Dr. Merrily Pew who was concerned about intracerebral hemorrhage.  Brief examination was very concerning for cerebral herniation and brainstem compression, no cranial nerve activity on my examination Head CT confirmed devastating intracerebral hemorrhage. Dr. Merrily Pew felt comfortable discussing transition to comfort measures with family, and full neurological consultation was not required  Brooke Dare MD-PhD [facility] [phone]

## 2021-12-06 NOTE — ED Notes (Signed)
CCM at bedside to assess patient and after talking with family code stroke was activated. Pt transported to CT with RN, MD, and RT.  CCM at bedside to disscuss results with family.

## 2021-12-06 NOTE — ED Notes (Signed)
Time of death occurred at 0210 pronounced by Landry Corporal and Claris Pong.

## 2021-12-06 NOTE — Procedures (Signed)
Extubation Procedure Note  Patient Details:   Name: Nicole Hughes DOB: Jul 31, 1970 MRN: [id number]   Airway Documentation:  Airway 7.5 mm (Active)  Secured at (cm) 24 cm 12/05/2021 2040  Measured From Lips 11/07/2021 2040  Secured Location Center 11/12/2021 2040  Secured By [employer] 11/29/2021 2040  Cuff Pressure (cm H2O) MOV (Manual Technique) 11/20/2021 2100  Site Condition Dry 11/06/2021 2100   Vent end date: (not recorded) Vent end time: (not recorded)   Evaluation  O2 sats: currently acceptable Complications: No apparent complications Patient did tolerate procedure well. Bilateral Breath Sounds: Clear, Diminished   No Pt extubated to comfort care per order.   Hassan Buckler 21-Nov-2021, 1:13 AM

## 2021-12-06 NOTE — H&P (Signed)
Critical care attending attestation note: I agree with the Advanced Practitioner's note, impression, and recommendations as outlined. I have taken an independent interval history, reviewed the chart and examined the patient. The following reflects my medical decision making and independent critical care time   Synopsis of assessment and plan: 51 year old female with history of DVT on Eliquis, alcohol use disorder, hypertension and prior history of pancreatitis who was brought into the emergency department after she was found unresponsive at home.  Earlier in the day patient started complaining of left-sided weakness with left facial droop and headache but then she went to bed, found unresponsive and brought into the emergency department   Physical exam: General: Critically ill-appearing female, orally intubated HEENT: [place]/AT, eyes anicteric.  ETT and OGT in place Neuro: Sedated, not following commands.  Eyes are closed.  Pupils fixed and dilated, absent corneals, weak cough and gag Chest: Coarse breath sounds, no wheezes or rhonchi Heart: Regular rate and rhythm, no murmurs or gallops Abdomen: Soft, nontender, nondistended, bowel sounds present Skin: No rash  Labs and images were reviewed CT scan showed  Assessment and plan: Acute right-sided intraparenchymal hemorrhage with intraventricular extension with ICH score of 4 (expected mortality 97%) Cerebral edema Brain compression Herniation syndrome Obstructive hydrocephalus Acute respiratory failure due to intraparenchymal hemorrhage Anemia of chronic disease Alcohol use disorder Thrombocytopenia History of DVT on Eliquis Lactic acidosis Shock liver  Unfortunately patient has suffered devastating neurological injury, which is nonsurvivable Continue goals of care discussion Continue lung protective ventilation I do not see a role of EVD placement for obstructive hydrocephalus considering severe neurological injury After speaking  with family patient is DNR, they are awaiting other family members to arrive before proceeding with comfort care  CRITICAL CARE Performed by: Cheri Fowler   Total independent critical care time: 40 minutes  Critical care time was exclusive of separately billable procedures and treating other patients.  Critical care was necessary to treat or prevent imminent or life-threatening deterioration.   Critical care was time spent personally by me on the following activities: development of treatment plan with patient and/or surrogate as well as nursing, discussions with consultants, evaluation of patient's response to treatment, examination of patient, obtaining history from patient or surrogate, ordering and performing treatments and interventions, ordering and review of laboratory studies, ordering and review of radiographic studies, pulse oximetry, re-evaluation of patient's condition and participation in multidisciplinary rounds.  Cheri Fowler, MD [facility] See Amion for pager If no response to pager, please call [phone] until 7pm After 7pm, Please call E-link [phone]  11/18/2021, 12:03 AM

## 2021-12-06 DEATH — deceased
